# Patient Record
Sex: Male | Born: 1950 | Race: Black or African American | Hispanic: No | Smoking: Never smoker
Health system: Southern US, Community
[De-identification: ages and names within clinical notes are randomized; demographics above are authoritative.]

## PROBLEM LIST (undated history)

## (undated) DIAGNOSIS — R059 Cough, unspecified: Secondary | ICD-10-CM

## (undated) DIAGNOSIS — I739 Peripheral vascular disease, unspecified: Secondary | ICD-10-CM

## (undated) DIAGNOSIS — D649 Anemia, unspecified: Secondary | ICD-10-CM

## (undated) DIAGNOSIS — L723 Sebaceous cyst: Secondary | ICD-10-CM

## (undated) DIAGNOSIS — E43 Unspecified severe protein-calorie malnutrition: Secondary | ICD-10-CM

## (undated) DIAGNOSIS — N493 Fournier gangrene: Secondary | ICD-10-CM

## (undated) DIAGNOSIS — E039 Hypothyroidism, unspecified: Secondary | ICD-10-CM

## (undated) DIAGNOSIS — D631 Anemia in chronic kidney disease: Secondary | ICD-10-CM

## (undated) DIAGNOSIS — M1990 Unspecified osteoarthritis, unspecified site: Secondary | ICD-10-CM

## (undated) DIAGNOSIS — I4891 Unspecified atrial fibrillation: Secondary | ICD-10-CM

## (undated) DIAGNOSIS — R042 Hemoptysis: Secondary | ICD-10-CM

## (undated) DIAGNOSIS — M109 Gout, unspecified: Secondary | ICD-10-CM

## (undated) DIAGNOSIS — B1910 Unspecified viral hepatitis B without hepatic coma: Secondary | ICD-10-CM

## (undated) DIAGNOSIS — I499 Cardiac arrhythmia, unspecified: Secondary | ICD-10-CM

## (undated) DIAGNOSIS — I219 Acute myocardial infarction, unspecified: Secondary | ICD-10-CM

## (undated) DIAGNOSIS — J9600 Acute respiratory failure, unspecified whether with hypoxia or hypercapnia: Secondary | ICD-10-CM

## (undated) DIAGNOSIS — A419 Sepsis, unspecified organism: Secondary | ICD-10-CM

## (undated) DIAGNOSIS — E876 Hypokalemia: Secondary | ICD-10-CM

## (undated) DIAGNOSIS — K219 Gastro-esophageal reflux disease without esophagitis: Secondary | ICD-10-CM

## (undated) DIAGNOSIS — Z933 Colostomy status: Secondary | ICD-10-CM

## (undated) DIAGNOSIS — N186 End stage renal disease: Secondary | ICD-10-CM

## (undated) DIAGNOSIS — Z992 Dependence on renal dialysis: Secondary | ICD-10-CM

## (undated) DIAGNOSIS — B159 Hepatitis A without hepatic coma: Secondary | ICD-10-CM

## (undated) DIAGNOSIS — L97909 Non-pressure chronic ulcer of unspecified part of unspecified lower leg with unspecified severity: Secondary | ICD-10-CM

## (undated) DIAGNOSIS — B20 Human immunodeficiency virus [HIV] disease: Secondary | ICD-10-CM

## (undated) DIAGNOSIS — R4182 Altered mental status, unspecified: Secondary | ICD-10-CM

## (undated) DIAGNOSIS — B1920 Unspecified viral hepatitis C without hepatic coma: Secondary | ICD-10-CM

## (undated) DIAGNOSIS — F1010 Alcohol abuse, uncomplicated: Secondary | ICD-10-CM

## (undated) DIAGNOSIS — N039 Chronic nephritic syndrome with unspecified morphologic changes: Secondary | ICD-10-CM

## (undated) DIAGNOSIS — Z21 Asymptomatic human immunodeficiency virus [HIV] infection status: Secondary | ICD-10-CM

## (undated) DIAGNOSIS — R05 Cough: Secondary | ICD-10-CM

## (undated) DIAGNOSIS — K5900 Constipation, unspecified: Secondary | ICD-10-CM

## (undated) DIAGNOSIS — I10 Essential (primary) hypertension: Secondary | ICD-10-CM

## (undated) DIAGNOSIS — I96 Gangrene, not elsewhere classified: Secondary | ICD-10-CM

## (undated) HISTORY — DX: Acute respiratory failure, unspecified whether with hypoxia or hypercapnia: J96.00

## (undated) HISTORY — DX: Hemoptysis: R04.2

## (undated) HISTORY — PX: HERNIA REPAIR: SHX51

## (undated) HISTORY — DX: Hypokalemia: E87.6

## (undated) HISTORY — DX: Cough, unspecified: R05.9

## (undated) HISTORY — DX: Anemia in chronic kidney disease: D63.1

## (undated) HISTORY — DX: Unspecified severe protein-calorie malnutrition: E43

## (undated) HISTORY — DX: Altered mental status, unspecified: R41.82

## (undated) HISTORY — DX: Cough: R05

## (undated) HISTORY — DX: Disorder of phosphorus metabolism, unspecified: E83.30

## (undated) HISTORY — DX: Chronic nephritic syndrome with unspecified morphologic changes: N03.9

## (undated) HISTORY — DX: Peripheral vascular disease, unspecified: I73.9

## (undated) HISTORY — DX: Gastro-esophageal reflux disease without esophagitis: K21.9

## (undated) HISTORY — DX: Sebaceous cyst: L72.3

## (undated) HISTORY — DX: Colostomy status: Z93.3

## (undated) HISTORY — DX: Gangrene, not elsewhere classified: I96

## (undated) HISTORY — DX: Unspecified atrial fibrillation: I48.91

## (undated) HISTORY — DX: Non-pressure chronic ulcer of unspecified part of unspecified lower leg with unspecified severity: L97.909

## (undated) HISTORY — DX: Constipation, unspecified: K59.00

---

## 2006-06-23 ENCOUNTER — Emergency Department (HOSPITAL_COMMUNITY): Admission: EM | Admit: 2006-06-23 | Discharge: 2006-06-23 | Payer: Self-pay | Admitting: Emergency Medicine

## 2007-07-31 ENCOUNTER — Emergency Department (HOSPITAL_COMMUNITY): Admission: EM | Admit: 2007-07-31 | Discharge: 2007-07-31 | Payer: Self-pay | Admitting: Emergency Medicine

## 2010-07-13 ENCOUNTER — Encounter: Admission: RE | Admit: 2010-07-13 | Discharge: 2010-07-13 | Payer: Self-pay | Admitting: Internal Medicine

## 2010-09-15 ENCOUNTER — Encounter: Payer: Self-pay | Admitting: Internal Medicine

## 2010-09-15 DIAGNOSIS — B20 Human immunodeficiency virus [HIV] disease: Secondary | ICD-10-CM | POA: Insufficient documentation

## 2010-09-20 ENCOUNTER — Ambulatory Visit
Admission: RE | Admit: 2010-09-20 | Discharge: 2010-09-20 | Payer: Self-pay | Source: Home / Self Care | Attending: Internal Medicine | Admitting: Internal Medicine

## 2010-09-20 ENCOUNTER — Encounter: Payer: Self-pay | Admitting: Internal Medicine

## 2010-09-20 ENCOUNTER — Encounter: Payer: Self-pay | Admitting: Adult Health

## 2010-09-20 ENCOUNTER — Ambulatory Visit: Admit: 2010-09-20 | Payer: Self-pay | Admitting: Internal Medicine

## 2010-09-20 LAB — CONVERTED CEMR LAB
ALT: 38 units/L (ref 0–53)
AST: 44 units/L — ABNORMAL HIGH (ref 0–37)
Albumin: 4.2 g/dL (ref 3.5–5.2)
Alkaline Phosphatase: 58 units/L (ref 39–117)
BUN: 42 mg/dL — ABNORMAL HIGH (ref 6–23)
Bacteria, UA: NONE SEEN
Basophils Absolute: 0 10*3/uL (ref 0.0–0.1)
Basophils Relative: 1 % (ref 0–1)
Bilirubin Urine: NEGATIVE
CO2: 17 meq/L — ABNORMAL LOW (ref 19–32)
Calcium: 9.3 mg/dL (ref 8.4–10.5)
Casts: NONE SEEN /lpf
Chlamydia, Swab/Urine, PCR: NEGATIVE
Chloride: 108 meq/L (ref 96–112)
Cholesterol: 163 mg/dL (ref 0–200)
Creatinine, Ser: 2.47 mg/dL — ABNORMAL HIGH (ref 0.40–1.50)
Crystals: NONE SEEN
Eosinophils Absolute: 0.1 10*3/uL (ref 0.0–0.7)
Eosinophils Relative: 1 % (ref 0–5)
GC Probe Amp, Urine: NEGATIVE
Glucose, Bld: 94 mg/dL (ref 70–99)
HCT: 39.6 % (ref 39.0–52.0)
HCV Ab: REACTIVE — AB
HDL: 46 mg/dL (ref >39)
HIV-1 antibody: POSITIVE — AB
HIV-2 Ab: NEGATIVE
Hemoglobin: 12.9 g/dL — ABNORMAL LOW (ref 13.0–17.0)
Hep A Total Ab: POSITIVE — AB
Hep B Core Total Ab: POSITIVE — AB
Hep B S Ab: NEGATIVE
Hepatitis B Surface Ag: NEGATIVE
Ketones, ur: NEGATIVE mg/dL
LDL Cholesterol: 93 mg/dL (ref 0–99)
Leukocytes, UA: NEGATIVE
Lymphocytes Relative: 31 % (ref 12–46)
Lymphs Abs: 1.5 10*3/uL (ref 0.7–4.0)
MCHC: 32.6 g/dL (ref 30.0–36.0)
MCV: 93 fL (ref 78.0–100.0)
Monocytes Absolute: 0.6 10*3/uL (ref 0.1–1.0)
Monocytes Relative: 12 % (ref 3–12)
Neutro Abs: 2.6 10*3/uL (ref 1.7–7.7)
Neutrophils Relative %: 55 % (ref 43–77)
Nitrite: NEGATIVE
Platelets: 172 10*3/uL (ref 150–400)
Potassium: 4.3 meq/L (ref 3.5–5.3)
Protein, ur: 100 mg/dL — AB
RBC: 4.26 M/uL (ref 4.22–5.81)
RDW: 14.3 % (ref 11.5–15.5)
Sodium: 135 meq/L (ref 135–145)
Specific Gravity, Urine: 1.017 (ref 1.005–1.030)
Squamous Epithelial / LPF: NONE SEEN /lpf
Total Bilirubin: 0.6 mg/dL (ref 0.3–1.2)
Total CHOL/HDL Ratio: 3.5
Total Protein: 8.1 g/dL (ref 6.0–8.3)
Triglycerides: 121 mg/dL (ref <150)
Urine Glucose: NEGATIVE mg/dL
Urobilinogen, UA: 0.2 (ref 0.0–1.0)
VLDL: 24 mg/dL (ref 0–40)
WBC: 4.7 10*3/uL (ref 4.0–10.5)
pH: 5.5 (ref 5.0–8.0)

## 2010-09-26 LAB — T-HELPER CELL (CD4) - (RCID CLINIC ONLY)
CD4 % Helper T Cell: 23 % — ABNORMAL LOW (ref 33–55)
CD4 T Cell Abs: 340 uL — ABNORMAL LOW (ref 400–2700)

## 2010-10-02 ENCOUNTER — Encounter: Payer: Self-pay | Admitting: Internal Medicine

## 2010-10-05 ENCOUNTER — Ambulatory Visit: Admit: 2010-10-05 | Payer: Self-pay | Admitting: Internal Medicine

## 2010-10-07 ENCOUNTER — Encounter: Payer: Self-pay | Admitting: Adult Health

## 2010-10-07 ENCOUNTER — Ambulatory Visit
Admission: RE | Admit: 2010-10-07 | Discharge: 2010-10-07 | Payer: Self-pay | Source: Home / Self Care | Attending: Adult Health | Admitting: Adult Health

## 2010-10-07 DIAGNOSIS — B1710 Acute hepatitis C without hepatic coma: Secondary | ICD-10-CM | POA: Insufficient documentation

## 2010-10-07 DIAGNOSIS — R6882 Decreased libido: Secondary | ICD-10-CM | POA: Insufficient documentation

## 2010-10-07 DIAGNOSIS — N529 Male erectile dysfunction, unspecified: Secondary | ICD-10-CM | POA: Insufficient documentation

## 2010-10-07 DIAGNOSIS — M1A00X0 Idiopathic chronic gout, unspecified site, without tophus (tophi): Secondary | ICD-10-CM | POA: Insufficient documentation

## 2010-10-07 DIAGNOSIS — M1A9XX0 Chronic gout, unspecified, without tophus (tophi): Secondary | ICD-10-CM | POA: Insufficient documentation

## 2010-10-07 LAB — CONVERTED CEMR LAB
HCV Quantitative: 4770000 intl units/mL — ABNORMAL HIGH (ref <43)
HIV 1 RNA Quant: 28700 copies/mL — ABNORMAL HIGH (ref <20)
HIV-1 RNA Quant, Log: 4.46 — ABNORMAL HIGH (ref <1.30)
Testosterone: 804.96 ng/dL (ref 250–890)
Uric Acid, Serum: 9.8 mg/dL — ABNORMAL HIGH (ref 4.0–7.8)

## 2010-10-13 NOTE — Miscellaneous (Signed)
Summary: HIPAA Restrictions  HIPAA Restrictions   Imported By: Florinda Marker 09/20/2010 11:45:02  _____________________________________________________________________  External Attachment:    Type:   Image     Comment:   External Document

## 2010-10-13 NOTE — Miscellaneous (Signed)
Summary: Orders Update  Clinical Lists Changes  Problems: Added new problem of HIV INFECTION (ICD-042) Orders: Added new Test order of T-Comprehensive Metabolic Panel [phone]) - Signed Added new Test order of T-CBC w/Diff [phone]) - Signed Added new Test order of T-CD4SP [facility]) (CD4SP) - Signed Added new Test order of T-Chlamydia  Probe, urine [phone]) - Signed Added new Test order of T-GC Probe, urine [phone]) - Signed Added new Test order of T-Hepatitis B Surface Antigen [phone]) - Signed Added new Test order of T-Hepatitis B Surface Antibody [phone]) - Signed Added new Test order of T-Hepatitis B Core Antibody ([id number]) - Signed Added new Test order of T-Hepatitis C Antibody ([id number]) - Signed Added new Test order of T-HIV Ab Confirmatory Test/Western Blot ([id number]) - Signed Added new Test order of T-Lipid Profile ([id number]) - Signed Added new Test order of T-RPR (Syphilis) [phone]) - Signed Added new Test order of T-Urinalysis ([id number]) - Signed Added new Test order of T-Hepatitis A Antibody ([id number]) - Signed Added new Test order of T-HIV1 Quant rflx Ultra or Genotype ([id number]) - Signed

## 2010-10-21 ENCOUNTER — Ambulatory Visit: Payer: Self-pay | Admitting: Adult Health

## 2010-10-31 ENCOUNTER — Telehealth: Payer: Self-pay | Admitting: Adult Health

## 2010-11-04 ENCOUNTER — Encounter: Payer: Self-pay | Admitting: Adult Health

## 2010-11-04 ENCOUNTER — Other Ambulatory Visit: Payer: Self-pay

## 2010-11-04 ENCOUNTER — Encounter (INDEPENDENT_AMBULATORY_CARE_PROVIDER_SITE_OTHER): Payer: Self-pay | Admitting: *Deleted

## 2010-11-04 ENCOUNTER — Other Ambulatory Visit: Payer: Self-pay | Admitting: Adult Health

## 2010-11-04 LAB — CONVERTED CEMR LAB
ALT: 32 units/L (ref 0–53)
AST: 36 units/L (ref 0–37)
Albumin: 3.7 g/dL (ref 3.5–5.2)
Alkaline Phosphatase: 62 units/L (ref 39–117)
BUN: 35 mg/dL — ABNORMAL HIGH (ref 6–23)
Basophils Absolute: 0 10*3/uL (ref 0.0–0.1)
Basophils Relative: 0 % (ref 0–1)
CO2: 11 meq/L — ABNORMAL LOW (ref 19–32)
Calcium: 9 mg/dL (ref 8.4–10.5)
Chloride: 106 meq/L (ref 96–112)
Creatinine, Ser: 2.78 mg/dL — ABNORMAL HIGH (ref 0.40–1.50)
Eosinophils Absolute: 0.1 10*3/uL (ref 0.0–0.7)
Eosinophils Relative: 2 % (ref 0–5)
Glucose, Bld: 93 mg/dL (ref 70–99)
HCT: 39 % (ref 39.0–52.0)
HIV 1 RNA Quant: <20 copies/mL (ref <20)
HIV-1 RNA Quant, Log: <1.3 (ref <1.30)
Hemoglobin: 13.1 g/dL (ref 13.0–17.0)
Lymphocytes Relative: 42 % (ref 12–46)
Lymphs Abs: 2.1 10*3/uL (ref 0.7–4.0)
MCHC: 33.6 g/dL (ref 30.0–36.0)
MCV: 92.2 fL (ref 78.0–100.0)
Monocytes Absolute: 0.6 10*3/uL (ref 0.1–1.0)
Monocytes Relative: 11 % (ref 3–12)
Neutro Abs: 2.3 10*3/uL (ref 1.7–7.7)
Neutrophils Relative %: 46 % (ref 43–77)
Platelets: 185 10*3/uL (ref 150–400)
Potassium: 4.3 meq/L (ref 3.5–5.3)
RBC: 4.23 M/uL (ref 4.22–5.81)
RDW: 13.6 % (ref 11.5–15.5)
Sodium: 136 meq/L (ref 135–145)
Total Bilirubin: 0.5 mg/dL (ref 0.3–1.2)
Total Protein: 7.5 g/dL (ref 6.0–8.3)
WBC: 5.1 10*3/uL (ref 4.0–10.5)

## 2010-11-04 LAB — T-HELPER CELL (CD4) - (RCID CLINIC ONLY)
CD4 % Helper T Cell: 22 % — ABNORMAL LOW (ref 33–55)
CD4 T Cell Abs: 560 uL (ref 400–2700)

## 2010-11-07 ENCOUNTER — Encounter (INDEPENDENT_AMBULATORY_CARE_PROVIDER_SITE_OTHER): Payer: Self-pay | Admitting: *Deleted

## 2010-11-17 ENCOUNTER — Other Ambulatory Visit: Payer: Self-pay

## 2010-11-17 NOTE — Miscellaneous (Signed)
Summary: ADAP UPDATE   Clinical Lists Changes  Observations: Added new observation of RWTITLE: B (11/07/2010 13:02) Added new observation of PAYOR: No Insurance (11/07/2010 13:02) Added new observation of PCTFPL: 78.71  (11/07/2010 13:02) Added new observation of INCOMESOURCE: WAGES 2011 NOW UNEMPLOYED  (11/07/2010 13:02) Added new observation of AIDSDAP: Pending-APPROVAL 2012  (11/07/2010 13:02) Added new observation of HOUSEINCOME: 8524  (11/07/2010 13:02) Added new observation of #CHILD<18 IN: No  (11/07/2010 13:02) Added new observation of FAMILYSIZE: 1  (11/07/2010 13:02) Added new observation of HOUSING: Stable/permanent  (11/07/2010 13:02) Added new observation of YEARLYEXPEN: 0  (11/07/2010 13:02) Added new observation of FINASSESSDT: 11/04/2010  (11/07/2010 13:02) Added new observation of RWPARTICIP: Yes  (11/07/2010 13:02)

## 2010-11-21 ENCOUNTER — Encounter (INDEPENDENT_AMBULATORY_CARE_PROVIDER_SITE_OTHER): Payer: Self-pay | Admitting: *Deleted

## 2010-11-29 NOTE — Miscellaneous (Signed)
Summary: adap approved til 06/11/11  Clinical Lists Changes  Observations: Added new observation of AIDSDAP: Yes 2012 (11/21/2010 13:06)

## 2010-12-08 NOTE — Progress Notes (Signed)
Summary: needing OV notes faxed to Dr. Briant Cedar  Phone Note From Other Clinic   Caller: Nicholas Lose, [facility], [phone] x [phone], fax [phone] Summary of Call: Requesting office visit notes from last 2 visit.  Please fax ASAP to Dr. Briant Cedar at [phone].  Jennet Maduro RN  October 31, 2010 11:38 AM

## 2011-04-17 ENCOUNTER — Other Ambulatory Visit (INDEPENDENT_AMBULATORY_CARE_PROVIDER_SITE_OTHER): Payer: Self-pay

## 2011-04-17 ENCOUNTER — Other Ambulatory Visit: Payer: Self-pay | Admitting: Adult Health

## 2011-04-17 DIAGNOSIS — B20 Human immunodeficiency virus [HIV] disease: Secondary | ICD-10-CM

## 2011-04-18 LAB — CBC WITH DIFFERENTIAL/PLATELET
Basophils Absolute: 0 10*3/uL (ref 0.0–0.1)
Basophils Relative: 0 % (ref 0–1)
Eosinophils Absolute: 0 10*3/uL (ref 0.0–0.7)
Eosinophils Relative: 1 % (ref 0–5)
HCT: 35.5 % — ABNORMAL LOW (ref 39.0–52.0)
Hemoglobin: 11.4 g/dL — ABNORMAL LOW (ref 13.0–17.0)
Lymphocytes Relative: 27 % (ref 12–46)
Lymphs Abs: 1.8 10*3/uL (ref 0.7–4.0)
MCH: 30.1 pg (ref 26.0–34.0)
MCHC: 32.1 g/dL (ref 30.0–36.0)
MCV: 93.7 fL (ref 78.0–100.0)
Monocytes Absolute: 0.7 10*3/uL (ref 0.1–1.0)
Monocytes Relative: 10 % (ref 3–12)
Neutro Abs: 4.1 10*3/uL (ref 1.7–7.7)
Neutrophils Relative %: 61 % (ref 43–77)
Platelets: 171 10*3/uL (ref 150–400)
RBC: 3.79 MIL/uL — ABNORMAL LOW (ref 4.22–5.81)
RDW: 14.3 % (ref 11.5–15.5)
WBC: 6.6 10*3/uL (ref 4.0–10.5)

## 2011-04-18 LAB — COMPREHENSIVE METABOLIC PANEL
ALT: 66 U/L — ABNORMAL HIGH (ref 0–53)
AST: 63 U/L — ABNORMAL HIGH (ref 0–37)
Albumin: 3.5 g/dL (ref 3.5–5.2)
Alkaline Phosphatase: 57 U/L (ref 39–117)
BUN: 50 mg/dL — ABNORMAL HIGH (ref 6–23)
CO2: 17 mEq/L — ABNORMAL LOW (ref 19–32)
Calcium: 8.8 mg/dL (ref 8.4–10.5)
Chloride: 109 mEq/L (ref 96–112)
Creat: 4.51 mg/dL — ABNORMAL HIGH (ref 0.50–1.35)
Glucose, Bld: 89 mg/dL (ref 70–99)
Potassium: 4.3 mEq/L (ref 3.5–5.3)
Sodium: 137 mEq/L (ref 135–145)
Total Bilirubin: 0.7 mg/dL (ref 0.3–1.2)
Total Protein: 6.9 g/dL (ref 6.0–8.3)

## 2011-04-18 LAB — T-HELPER CELL (CD4) - (RCID CLINIC ONLY)
CD4 % Helper T Cell: 23 % — ABNORMAL LOW (ref 33–55)
CD4 T Cell Abs: 440 uL (ref 400–2700)

## 2011-04-19 LAB — HIV-1 RNA QUANT-NO REFLEX-BLD
HIV 1 RNA Quant: 16100 copies/mL — ABNORMAL HIGH (ref <20)
HIV-1 RNA Quant, Log: 4.21 {Log} — ABNORMAL HIGH (ref <1.30)

## 2011-04-21 ENCOUNTER — Other Ambulatory Visit: Payer: Self-pay | Admitting: Adult Health

## 2011-04-21 DIAGNOSIS — B20 Human immunodeficiency virus [HIV] disease: Secondary | ICD-10-CM

## 2011-04-24 LAB — HIV-1 RNA ULTRAQUANT REFLEX TO GENTYP+
HIV 1 RNA Quant: 17400 copies/mL — ABNORMAL HIGH (ref <20)
HIV-1 RNA Quant, Log: 4.24 {Log} — ABNORMAL HIGH (ref <1.30)

## 2011-05-01 LAB — HIV-1 GENOTYPR PLUS

## 2011-05-02 ENCOUNTER — Ambulatory Visit (INDEPENDENT_AMBULATORY_CARE_PROVIDER_SITE_OTHER): Payer: Self-pay | Admitting: Adult Health

## 2011-05-02 ENCOUNTER — Encounter: Payer: Self-pay | Admitting: Adult Health

## 2011-05-02 ENCOUNTER — Ambulatory Visit: Payer: Self-pay

## 2011-05-02 VITALS — BP 168/93 | HR 89 | Temp 98.4°F | Ht 71.0 in | Wt 242.0 lb

## 2011-05-02 DIAGNOSIS — B20 Human immunodeficiency virus [HIV] disease: Secondary | ICD-10-CM

## 2011-05-03 ENCOUNTER — Ambulatory Visit: Payer: Self-pay

## 2011-05-16 ENCOUNTER — Ambulatory Visit: Payer: Self-pay | Admitting: Adult Health

## 2011-06-20 LAB — URIC ACID: Uric Acid, Serum: 9.7 — ABNORMAL HIGH

## 2011-09-12 HISTORY — PX: COLOSTOMY: SHX63

## 2011-10-17 NOTE — Assessment & Plan Note (Signed)
Was off his medications for approximately 6 weeks prior to labs as a result of his ADAP lapsing. Encouraged to followup with Mrs. Jeraldine Loots to reestablish eligibility for ADAP and resume medications. Counseling provided on prevention of transmission of HIV. Condoms offered:  Yes Medication adherence discussed with patient. Eligibility coordinator Follow up visit in 4 months with labs one month from today and 2 weeks prior to appointment. Patient acknowledged information provided to them and agreed with plan of care.

## 2011-10-17 NOTE — Progress Notes (Signed)
Subjective:    Patient ID: Bryan Gilmore is a 61 y.o. male.  Chief Complaint: HIV Follow-up Visit PAL SHELL is here for follow-up of HIV infection. He is feeling unchanged since his last visit.  He relates that he has been off his antiretrovirals for over 6 weeks as a result of his ADAP lapsing. He states he plans to have this taken care of with the eligibility coordinator today. There are not additional complaints.   Data Review: Diagnostic studies reviewed.  Review of Systems - General ROS: negative for - fatigue or malaise Psychological ROS: negative for - anxiety, behavioral disorder, concentration difficulties, depression or mood swings Ophthalmic ROS: negative ENT ROS: negative Respiratory ROS: no cough, shortness of breath, or wheezing Cardiovascular ROS: no chest pain or dyspnea on exertion Gastrointestinal ROS: no abdominal pain, change in bowel habits, or black or bloody stools Genito-Urinary ROS: no dysuria, trouble voiding, or hematuria Neurological ROS: no TIA or stroke symptoms Dermatological ROS: negative for rash and skin lesion changes  Objective:   General appearance: alert and cooperative Head: Normocephalic, without obvious abnormality, atraumatic Eyes: conjunctivae/corneas clear. PERRL, EOM's intact. Fundi benign. Throat: lips, mucosa, and tongue normal; teeth and gums normal Resp: clear to auscultation bilaterally Cardio: regular rate and rhythm, S1, S2 normal, no murmur, click, rub or gallop GI: soft, non-tender; bowel sounds normal; no masses,  no organomegaly Extremities: extremities normal, atraumatic, no cyanosis or edema Skin: Skin color, texture, turgor normal. No rashes or lesions Neurologic: Grossly normal Psych:  No vegetative signs or delusional behaviors noted.    Laboratory: From 04/17/2011 ,  CD4 count was 440 c/cmm @ 23 %. Viral load 17,400 copies/ml. HIV genotype, consistent with wild-type virus as pansensitive.   Assessment/Plan:    HIV INFECTION Was off his medications for approximately 6 weeks prior to labs as a result of his ADAP lapsing. Encouraged to followup with Mrs. Jeraldine Loots to reestablish eligibility for ADAP and resume medications. Counseling provided on prevention of transmission of HIV. Condoms offered:  Yes Medication adherence discussed with patient. Eligibility coordinator Follow up visit in 4 months with labs one month from today and 2 weeks prior to appointment. Patient acknowledged information provided to them and agreed with plan of care.        Eppie Barhorst A. Sundra Aland, MS, [facility] [phone]  10/17/2011, 8:25 AM

## 2011-12-14 ENCOUNTER — Ambulatory Visit: Payer: Self-pay

## 2011-12-18 ENCOUNTER — Ambulatory Visit: Payer: Self-pay

## 2011-12-26 ENCOUNTER — Other Ambulatory Visit: Payer: Self-pay

## 2011-12-26 DIAGNOSIS — Z01810 Encounter for preprocedural cardiovascular examination: Secondary | ICD-10-CM

## 2011-12-26 DIAGNOSIS — N184 Chronic kidney disease, stage 4 (severe): Secondary | ICD-10-CM

## 2012-02-01 ENCOUNTER — Encounter: Payer: Self-pay | Admitting: Vascular Surgery

## 2012-02-02 ENCOUNTER — Ambulatory Visit: Payer: Self-pay | Admitting: Vascular Surgery

## 2012-02-08 ENCOUNTER — Telehealth: Payer: Self-pay | Admitting: *Deleted

## 2012-02-08 DIAGNOSIS — Z113 Encounter for screening for infections with a predominantly sexual mode of transmission: Secondary | ICD-10-CM

## 2012-02-08 DIAGNOSIS — B20 Human immunodeficiency virus [HIV] disease: Secondary | ICD-10-CM

## 2012-02-08 DIAGNOSIS — Z79899 Other long term (current) drug therapy: Secondary | ICD-10-CM

## 2012-02-08 NOTE — Telephone Encounter (Signed)
I called & asked him to make appts for labs & to see md. I transferred him to [facility] who will schedule him. Lab orders entered

## 2012-02-10 HISTORY — PX: COLOSTOMY: SHX63

## 2012-02-10 HISTORY — PX: OTHER SURGICAL HISTORY: SHX169

## 2012-02-13 ENCOUNTER — Encounter (HOSPITAL_COMMUNITY): Payer: Self-pay | Admitting: Emergency Medicine

## 2012-02-13 ENCOUNTER — Emergency Department (HOSPITAL_COMMUNITY)
Admission: EM | Admit: 2012-02-13 | Discharge: 2012-02-13 | Disposition: A | Discharge status: Home / Self Care | Payer: Self-pay | Attending: Emergency Medicine | Admitting: Emergency Medicine

## 2012-02-13 DIAGNOSIS — N472 Paraphimosis: Secondary | ICD-10-CM

## 2012-02-13 DIAGNOSIS — N289 Disorder of kidney and ureter, unspecified: Secondary | ICD-10-CM | POA: Insufficient documentation

## 2012-02-13 DIAGNOSIS — N4889 Other specified disorders of penis: Secondary | ICD-10-CM | POA: Insufficient documentation

## 2012-02-13 DIAGNOSIS — I10 Essential (primary) hypertension: Secondary | ICD-10-CM | POA: Insufficient documentation

## 2012-02-13 DIAGNOSIS — M533 Sacrococcygeal disorders, not elsewhere classified: Secondary | ICD-10-CM | POA: Insufficient documentation

## 2012-02-13 DIAGNOSIS — M5430 Sciatica, unspecified side: Secondary | ICD-10-CM

## 2012-02-13 DIAGNOSIS — N390 Urinary tract infection, site not specified: Secondary | ICD-10-CM

## 2012-02-13 DIAGNOSIS — Z21 Asymptomatic human immunodeficiency virus [HIV] infection status: Secondary | ICD-10-CM | POA: Insufficient documentation

## 2012-02-13 DIAGNOSIS — A6000 Herpesviral infection of urogenital system, unspecified: Secondary | ICD-10-CM

## 2012-02-13 HISTORY — DX: Essential (primary) hypertension: I10

## 2012-02-13 HISTORY — DX: Asymptomatic human immunodeficiency virus (hiv) infection status: Z21

## 2012-02-13 HISTORY — DX: Human immunodeficiency virus (HIV) disease: B20

## 2012-02-13 LAB — URINALYSIS, ROUTINE W REFLEX MICROSCOPIC
Nitrite: NEGATIVE
Protein, ur: >300 mg/dL — AB
Specific Gravity, Urine: 1.017 (ref 1.005–1.030)
Urobilinogen, UA: 0.2 mg/dL (ref 0.0–1.0)

## 2012-02-13 LAB — URINE MICROSCOPIC-ADD ON

## 2012-02-13 MED ORDER — ACYCLOVIR 200 MG PO CAPS
400.0000 mg | ORAL_CAPSULE | Freq: Once | ORAL | Status: AC
  Administered 2012-02-13: 400 mg via ORAL
  Filled 2012-02-13: qty 2

## 2012-02-13 MED ORDER — IBUPROFEN 800 MG PO TABS
800.0000 mg | ORAL_TABLET | Freq: Once | ORAL | Status: AC
  Administered 2012-02-13: 800 mg via ORAL
  Filled 2012-02-13: qty 1

## 2012-02-13 MED ORDER — SULFAMETHOXAZOLE-TMP DS 800-160 MG PO TABS: 1.0000 | ORAL_TABLET | Freq: Two times a day (BID) | ORAL | Status: AC

## 2012-02-13 MED ORDER — SULFAMETHOXAZOLE-TMP DS 800-160 MG PO TABS
1.0000 | ORAL_TABLET | Freq: Once | ORAL | Status: AC
  Administered 2012-02-13: 1 via ORAL
  Filled 2012-02-13: qty 1

## 2012-02-13 MED ORDER — ACYCLOVIR 200 MG PO CAPS: 400.0000 mg | ORAL_CAPSULE | Freq: Three times a day (TID) | ORAL | Status: AC

## 2012-02-13 NOTE — ED Notes (Signed)
Patient states having groin pain and buttocks pain for one week. Stated dysuria and after triage gave a urine specimen and urinated blood.  States have not been sexually active.  Pain 7/10 achy sharp pain.

## 2012-02-13 NOTE — ED Notes (Signed)
Onset one week ago groin and buttock pain seen at [facility] department test negative.  Pain currently 6-7/10 achy dull states pain keeps patient up at night.  States dysuria.

## 2012-02-13 NOTE — ED Provider Notes (Signed)
History  Scribed for [employer]. Bernette Mayers, MD, the patient was seen in room STRE8/STRE8. This chart was scribed by Candelaria Stagers. The patient's care started at 11:11 AM    CSN: [id number]  Arrival date & time 02/13/12  1191   First MD Initiated Contact with Patient 02/13/12 1106      Chief Complaint  Patient presents with  . Groin Pain    Groin Pain This is a new problem. The current episode started in the past 7 days. The problem occurs constantly. The problem has been unchanged. The pain is medium. Associated symptoms include dysuria. Pertinent negatives include no abdominal pain, constipation, fever, nausea or vomiting. He has tried nothing for the symptoms. The treatment provided no relief. His past medical history is significant for HIV.   Bryan Gilmore is a 61 y.o. male who presents to the Emergency Department complaining of groin pain that started about one week ago.  Pt states that there is swelling to his penis and pain to his buttocks.  He is experiencing dysuria and numbness down his leg.  He denies back pain, bowel trouble, nausea, vomiting.  Pt is seen in the infectious disease clinic and has been taking his medications regularly.  Pt states that sitting on his buttocks makes the pain worse.  He has taken nothing for the pain.   Past Medical History  Diagnosis Date  . Hypertension   . Renal disorder   . HIV (human immunodeficiency virus infection)     History reviewed. No pertinent past surgical history.  No family history on file.  History  Substance Use Topics  . Smoking status: Never Smoker   . Smokeless tobacco: Never Used  . Alcohol Use: No      Review of Systems  Constitutional: Negative for fever.  Gastrointestinal: Negative for nausea, vomiting, abdominal pain and constipation.  Genitourinary: Positive for dysuria and penile swelling.  Musculoskeletal: Negative for back pain.  Neurological: Positive for numbness (down right leg).    Allergies    Review of patient's allergies indicates no known allergies.  Home Medications   Current Outpatient Rx  Name Route Sig Dispense Refill  . ALLOPURINOL 100 MG PO TABS Oral Take 300 mg by mouth daily.    Marland Kitchen EMTRICITABINE-TENOFOVIR 200-300 MG PO TABS Oral Take 1 tablet by mouth every Monday, Wednesday, and Friday.      . IBUPROFEN 800 MG PO TABS Oral Take 800 mg by mouth every 8 (eight) hours as needed. For gout flare up    . NEBIVOLOL HCL 20 MG PO TABS Oral Take 20 mg by mouth daily.    Marland Kitchen PARICALCITOL 1 MCG PO CAPS Oral Take 1 mcg by mouth daily.      Marland Kitchen RALTEGRAVIR POTASSIUM 400 MG PO TABS Oral Take 400 mg by mouth 2 (two) times daily.        BP 150/95  Pulse 75  Temp(Src) 97.7 F (36.5 C) (Oral)  Resp 16  SpO2 99%  Physical Exam  Nursing note and vitals reviewed. Constitutional: He is oriented to person, place, and time. He appears well-developed and well-nourished. No distress.  HENT:  Head: Normocephalic and atraumatic.  Eyes: EOM are normal. Pupils are equal, round, and reactive to light.  Neck: Neck supple. No tracheal deviation present.  Cardiovascular: Normal rate.   Pulmonary/Chest: Effort normal. No respiratory distress.  Abdominal: Soft. He exhibits no distension.  Genitourinary:       Uncircumcised.  Glans Normal.  Swelling around the foreskin.  2cm ulceration on the foreskin.  Moderate inguinal lymphadenopathy. No evidence of perineal swelling, erythema or induration. No concern for Fornier's Gangrene  Musculoskeletal: Normal range of motion. He exhibits tenderness (tender over the R sciatic notch). He exhibits no edema.  Neurological: He is alert and oriented to person, place, and time. No sensory deficit.  Skin: Skin is warm and dry.  Psychiatric: He has a normal mood and affect. His behavior is normal.    ED Course  Procedures  DIAGNOSTIC STUDIES: Oxygen Saturation is 99% on room air, normal by my interpretation.    COORDINATION OF CARE:     Labs Reviewed   URINALYSIS, ROUTINE W REFLEX MICROSCOPIC - Abnormal; Notable for the following:    APPearance CLOUDY (*)    Hgb urine dipstick LARGE (*)    Protein, ur >300 (*)    Leukocytes, UA LARGE (*)    All other components within normal limits  URINE MICROSCOPIC-ADD ON - Abnormal; Notable for the following:    Squamous Epithelial / LPF FEW (*)    Bacteria, UA FEW (*)    Casts HYALINE CASTS (*)    All other components within normal limits   No results found.   No diagnosis found.    MDM  UA shows evidence of infection, however not clear if this is a good clean sample given his mild paraphimosis. Will treat with Bactrim, foreskin is easily retracted. Painful ulcer could be herpetic, will also treat with Acyclovir. Buttock pain is likely sciatica, doubt deep infection. Pt states ibuprofen typically manages his pain at home. Advised followup with ID as scheduled  I personally performed the services described in the documentation, which were scribed in my presence. The recorded information has been reviewed and considered.             Tadeusz Stahl B. Bernette Mayers, MD 02/13/12 1149

## 2012-02-13 NOTE — Discharge Instructions (Signed)
Genital Herpes Genital herpes is a sexually transmitted disease. This means that it is a disease passed by having sex with an infected person. There is no cure for genital herpes. The time between attacks can be months to years. The virus may live in a person but produce no problems (symptoms). This infection can be passed to a baby as it travels down the birth canal (vagina). In a newborn, this can cause central nervous system damage, eye damage, or even death. The virus that causes genital herpes is usually HSV-2 virus. The virus that causes oral herpes is usually HSV-1. The diagnosis (learning what is wrong) is made through culture results. SYMPTOMS  Usually symptoms of pain and itching begin a few days to a week after contact. It first appears as small blisters that progress to small painful ulcers which then scab over and heal after several days. It affects the outer genitalia, birth canal, cervix, penis, anal area, buttocks, and thighs. HOME CARE INSTRUCTIONS   Keep ulcerated areas dry and clean.   Take medications as directed. Antiviral medications can speed up healing. They will not prevent recurrences or cure this infection. These medications can also be taken for suppression if there are frequent recurrences.   While the infection is active, it is contagious. Avoid all sexual contact during active infections.   Condoms may help prevent spread of the herpes virus.   Practice safe sex.   Wash your hands thoroughly after touching the genital area.   Avoid touching your eyes after touching your genital area.   Inform your caregiver if you have had genital herpes and become pregnant. It is your responsibility to insure a safe outcome for your baby in this pregnancy.   Only take over-the-counter or prescription medicines for pain, discomfort, or fever as directed by your caregiver.  SEEK MEDICAL CARE IF:   You have a recurrence of this infection.   You do not respond to medications and  are not improving.   You have new sources of pain or discharge which have changed from the original infection.   You have an oral temperature above 102 F (38.9 C).   You develop abdominal pain.   You develop eye pain or signs of eye infection.  Document Released: 08/25/2000 Document Revised: 08/17/2011 Document Reviewed: 09/15/2009 [employer] [facility] Patient Information 2012 [place], [place].Foreskin Problems This is information about problems that may occur if you were not circumcised. Keeping the penis and foreskin clean is very important. The types of problems that may require medical attention include:  Balanitis - This is an infection of the head of the penis from yeast, viruses, or bacteria. A red rash, swelling, and pain may develop. The treatment may include:   Sitz baths.   Medicine you put on the skin (topical).   Medicine you take by mouth (oral).   Phimosis - This occurs when the foreskin cannot be pulled back off the head of the penis. This can develop with a balanitis infection.   Paraphimosis - This happens when a tight foreskin is pulled back off the head of the penis and becomes stuck. This can cause pain, swelling, and reduce the blood flow and injure the head of the penis.  Phimosis and paraphimosis can be prevented by good hygiene. Retract the foreskin and wash around the head of the penis whenever you take a shower or bath. You should also replace the foreskin after it is retracted. Paraphimosis is a true emergency and requires immediate medical attention to get  the foreskin back over the end of the penis, and circumcision may be needed to prevent future problems.  SEEK IMMEDIATE MEDICAL CARE IF:   You are unable to urinate.   Have severe swelling or pain.  Document Released: 10/05/2004 Document Revised: 08/17/2011 Document Reviewed: 11/02/2008 [facility] Patient Information 2012 [place], [place].Sciatica Sciatica is a weakness and/or changes in sensation (tingling, jolts,  hot and cold, numbness) along the path the sciatic nerve travels. Irritation or damage to lumbar nerve roots is often also referred to as lumbar radiculopathy.  Lumbar radiculopathy (Sciatica) is the most common form of this problem. Radiculopathy can occur in any of the nerves coming out of the spinal cord. The problems caused depend on which nerves are involved. The sciatic nerve is the large nerve supplying the branches of nerves going from the hip to the toes. It often causes a numbness or weakness in the skin and/or muscles that the sciatic nerve serves. It also may cause symptoms (problems) of pain, burning, tingling, or electric shock-like feelings in the path of this nerve. This usually comes from injury to the fibers that make up the sciatic nerve. Some of these symptoms are low back pain and/or unpleasant feelings in the following areas:  From the mid-buttock down the back of the leg to the back of the knee.   And/or the outside of the calf and top of the foot.   And/or behind the inner ankle to the sole of the foot.  CAUSES   Herniated or slipped disc. Discs are the little cushions between the bones in the back.   Pressure by the piriformis muscle in the buttock on the sciatic nerve (Piriformis Syndrome).   Misalignment of the bones in the lower back and buttocks (Sacroiliac Joint Derangement).   Narrowing of the spinal canal that puts pressure on or pinches the fibers that make up the sciatic nerve.   A slipped vertebra that is out of line with those above or beneath it.   Abnormality of the nervous system itself so that nerve fibers do not transmit signals properly, especially to feet and calves (neuropathy).   Tumor (this is rare).  Your caregiver can usually determine the cause of your sciatica and begin the treatment most likely to help you. TREATMENT  Taking over-the-counter painkillers, physical therapy, rest, exercise, spinal manipulation, and injections of anesthetics  and/or steroids may be used. Surgery, acupuncture, and Yoga can also be effective. Mind over matter techniques, mental imagery, and changing factors such as your bed, chair, desk height, posture, and activities are other treatments that may be helpful. You and your caregiver can help determine what is best for you. With proper diagnosis, the cause of most sciatica can be identified and removed. Communication and cooperation between your caregiver and you is essential. If you are not successful immediately, do not be discouraged. With time, a proper treatment can be found that will make you comfortable. HOME CARE INSTRUCTIONS   If the pain is coming from a problem in the back, applying ice to that area for 15 to 20 minutes, 3 to 4 times per day while awake, may be helpful. Put the ice in a plastic bag. Place a towel between the bag of ice and your skin.   You may exercise or perform your usual activities if these do not aggravate your pain, or as suggested by your caregiver.   Only take over-the-counter or prescription medicines for pain, discomfort, or fever as directed by your caregiver.  If your caregiver has given you a follow-up appointment, it is very important to keep that appointment. Not keeping the appointment could result in a chronic or permanent injury, pain, and disability. If there is any problem keeping the appointment, you must call back to this facility for assistance.  SEEK IMMEDIATE MEDICAL CARE IF:   You experience loss of control of bowel or bladder.   You have increasing weakness in the trunk, buttocks, or legs.   There is numbness in any areas from the hip down to the toes.   You have difficulty walking or keeping your balance.   You have any of the above, with fever or forceful vomiting.  Document Released: 08/22/2001 Document Revised: 08/17/2011 Document Reviewed: 04/10/2008 [facility] Patient Information 2012 [place], [place].

## 2012-02-13 NOTE — ED Notes (Signed)
Patient uncircumcised and states noticed 3 days ago tip of penis light pink. Currently skin light pink no drainage present 0.25cm irregular shaped.

## 2012-02-13 NOTE — ED Notes (Signed)
Pt d/c home in NAD. Pt voiced understanding of d/c instructions including the importance of taking medication and of follow up.

## 2012-02-17 ENCOUNTER — Encounter (HOSPITAL_COMMUNITY): Payer: Self-pay | Admitting: Certified Registered Nurse Anesthetist

## 2012-02-17 ENCOUNTER — Inpatient Hospital Stay (HOSPITAL_COMMUNITY)
Admission: EM | Admit: 2012-02-17 | Discharge: 2012-03-01 | DRG: 569 | Payer: BC Managed Care – PPO | Attending: Internal Medicine | Admitting: Internal Medicine

## 2012-02-17 ENCOUNTER — Inpatient Hospital Stay (HOSPITAL_COMMUNITY): Payer: BC Managed Care – PPO

## 2012-02-17 ENCOUNTER — Encounter (HOSPITAL_COMMUNITY): Payer: Self-pay | Admitting: Emergency Medicine

## 2012-02-17 ENCOUNTER — Emergency Department (HOSPITAL_COMMUNITY): Payer: BC Managed Care – PPO | Admitting: Certified Registered Nurse Anesthetist

## 2012-02-17 ENCOUNTER — Encounter (HOSPITAL_COMMUNITY)
Admission: EM | Disposition: A | Discharge status: Other Acute Inpatient Hospital | Payer: Self-pay | Source: Home / Self Care | Attending: Pulmonary Disease

## 2012-02-17 ENCOUNTER — Emergency Department (HOSPITAL_COMMUNITY): Payer: BC Managed Care – PPO

## 2012-02-17 DIAGNOSIS — D631 Anemia in chronic kidney disease: Secondary | ICD-10-CM | POA: Diagnosis present

## 2012-02-17 DIAGNOSIS — E875 Hyperkalemia: Secondary | ICD-10-CM | POA: Diagnosis present

## 2012-02-17 DIAGNOSIS — R042 Hemoptysis: Secondary | ICD-10-CM | POA: Diagnosis not present

## 2012-02-17 DIAGNOSIS — I96 Gangrene, not elsewhere classified: Secondary | ICD-10-CM

## 2012-02-17 DIAGNOSIS — N179 Acute kidney failure, unspecified: Secondary | ICD-10-CM | POA: Diagnosis present

## 2012-02-17 DIAGNOSIS — D649 Anemia, unspecified: Secondary | ICD-10-CM | POA: Diagnosis present

## 2012-02-17 DIAGNOSIS — J9600 Acute respiratory failure, unspecified whether with hypoxia or hypercapnia: Secondary | ICD-10-CM

## 2012-02-17 DIAGNOSIS — N189 Chronic kidney disease, unspecified: Secondary | ICD-10-CM | POA: Diagnosis present

## 2012-02-17 DIAGNOSIS — E876 Hypokalemia: Secondary | ICD-10-CM | POA: Diagnosis not present

## 2012-02-17 DIAGNOSIS — E871 Hypo-osmolality and hyponatremia: Secondary | ICD-10-CM | POA: Diagnosis present

## 2012-02-17 DIAGNOSIS — N19 Unspecified kidney failure: Secondary | ICD-10-CM

## 2012-02-17 DIAGNOSIS — B1920 Unspecified viral hepatitis C without hepatic coma: Secondary | ICD-10-CM | POA: Diagnosis present

## 2012-02-17 DIAGNOSIS — E872 Acidosis, unspecified: Secondary | ICD-10-CM | POA: Diagnosis present

## 2012-02-17 DIAGNOSIS — A419 Sepsis, unspecified organism: Secondary | ICD-10-CM | POA: Diagnosis present

## 2012-02-17 DIAGNOSIS — B1710 Acute hepatitis C without hepatic coma: Secondary | ICD-10-CM

## 2012-02-17 DIAGNOSIS — N529 Male erectile dysfunction, unspecified: Secondary | ICD-10-CM

## 2012-02-17 DIAGNOSIS — N493 Fournier gangrene: Secondary | ICD-10-CM

## 2012-02-17 DIAGNOSIS — E43 Unspecified severe protein-calorie malnutrition: Secondary | ICD-10-CM | POA: Diagnosis present

## 2012-02-17 DIAGNOSIS — B20 Human immunodeficiency virus [HIV] disease: Secondary | ICD-10-CM

## 2012-02-17 DIAGNOSIS — R339 Retention of urine, unspecified: Secondary | ICD-10-CM | POA: Diagnosis present

## 2012-02-17 DIAGNOSIS — K612 Anorectal abscess: Secondary | ICD-10-CM | POA: Diagnosis present

## 2012-02-17 DIAGNOSIS — N35919 Unspecified urethral stricture, male, unspecified site: Principal | ICD-10-CM | POA: Diagnosis present

## 2012-02-17 DIAGNOSIS — A413 Sepsis due to Hemophilus influenzae: Secondary | ICD-10-CM

## 2012-02-17 DIAGNOSIS — N186 End stage renal disease: Secondary | ICD-10-CM | POA: Diagnosis present

## 2012-02-17 DIAGNOSIS — N501 Vascular disorders of male genital organs: Secondary | ICD-10-CM

## 2012-02-17 DIAGNOSIS — R6882 Decreased libido: Secondary | ICD-10-CM

## 2012-02-17 DIAGNOSIS — M1A00X0 Idiopathic chronic gout, unspecified site, without tophus (tophi): Secondary | ICD-10-CM

## 2012-02-17 HISTORY — PX: CYSTOSCOPY WITH URETHRAL DILATATION: SHX5125

## 2012-02-17 LAB — DIFFERENTIAL
Basophils Absolute: 0 10*3/uL (ref 0.0–0.1)
Basophils Absolute: 0 10*3/uL (ref 0.0–0.1)
Basophils Relative: 0 % (ref 0–1)
Eosinophils Absolute: 0 10*3/uL (ref 0.0–0.7)
Lymphocytes Relative: 4 % — ABNORMAL LOW (ref 12–46)
Lymphs Abs: 0.6 10*3/uL — ABNORMAL LOW (ref 0.7–4.0)
Monocytes Absolute: 1.7 10*3/uL — ABNORMAL HIGH (ref 0.1–1.0)
Monocytes Relative: 4 % (ref 3–12)
Neutrophils Relative %: 92 % — ABNORMAL HIGH (ref 43–77)
Neutrophils Relative %: 92 % — ABNORMAL HIGH (ref 43–77)

## 2012-02-17 LAB — HEPATIC FUNCTION PANEL
ALT: 12 U/L (ref 0–53)
AST: 22 U/L (ref 0–37)
Alkaline Phosphatase: 105 U/L (ref 39–117)
Bilirubin, Direct: 0.5 mg/dL — ABNORMAL HIGH (ref 0.0–0.3)
Total Bilirubin: 0.6 mg/dL (ref 0.3–1.2)

## 2012-02-17 LAB — COMPREHENSIVE METABOLIC PANEL
ALT: 10 U/L (ref 0–53)
AST: 23 U/L (ref 0–37)
CO2: 12 mEq/L — ABNORMAL LOW (ref 19–32)
Calcium: 8 mg/dL — ABNORMAL LOW (ref 8.4–10.5)
Sodium: 132 mEq/L — ABNORMAL LOW (ref 135–145)
Total Protein: 6.3 g/dL (ref 6.0–8.3)

## 2012-02-17 LAB — URINALYSIS, ROUTINE W REFLEX MICROSCOPIC
Bilirubin Urine: NEGATIVE
Glucose, UA: NEGATIVE mg/dL
Ketones, ur: NEGATIVE mg/dL
Nitrite: NEGATIVE
Specific Gravity, Urine: 1.024 (ref 1.005–1.030)
pH: 6 (ref 5.0–8.0)

## 2012-02-17 LAB — BASIC METABOLIC PANEL
CO2: 7 mEq/L — CL (ref 19–32)
CO2: 9 mEq/L — CL (ref 19–32)
Calcium: 9 mg/dL (ref 8.4–10.5)
Calcium: 8.5 mg/dL (ref 8.4–10.5)
Creatinine, Ser: 8.63 mg/dL — ABNORMAL HIGH (ref 0.50–1.35)
GFR calc non Af Amer: 6 mL/min — ABNORMAL LOW (ref >90)
Glucose, Bld: 93 mg/dL (ref 70–99)
Glucose, Bld: 111 mg/dL — ABNORMAL HIGH (ref 70–99)
Sodium: 128 mEq/L — ABNORMAL LOW (ref 135–145)

## 2012-02-17 LAB — CBC
Hemoglobin: 8.7 g/dL — ABNORMAL LOW (ref 13.0–17.0)
Hemoglobin: 6.2 g/dL — CL (ref 13.0–17.0)
MCH: 29.7 pg (ref 26.0–34.0)
MCH: 29.6 pg (ref 26.0–34.0)
MCHC: 35.8 g/dL (ref 30.0–36.0)
MCV: 85.9 fL (ref 78.0–100.0)
MCV: 85.7 fL (ref 78.0–100.0)
Platelets: 212 10*3/uL (ref 150–400)
Platelets: 212 10*3/uL (ref 150–400)
RBC: 2.94 MIL/uL — ABNORMAL LOW (ref 4.22–5.81)
RBC: 2.08 MIL/uL — ABNORMAL LOW (ref 4.22–5.81)
RDW: 14 % (ref 11.5–15.5)
WBC: 27.8 10*3/uL — ABNORMAL HIGH (ref 4.0–10.5)
WBC: 28 10*3/uL — ABNORMAL HIGH (ref 4.0–10.5)

## 2012-02-17 LAB — BLOOD GAS, ARTERIAL
Bicarbonate: 7.9 mEq/L — ABNORMAL LOW (ref 20.0–24.0)
O2 Content: 10 L/min
O2 Saturation: 98.4 %
Patient temperature: 98.6
pH, Arterial: 7.129 — CL (ref 7.350–7.450)

## 2012-02-17 LAB — URINE MICROSCOPIC-ADD ON

## 2012-02-17 LAB — POCT I-STAT 3, ART BLOOD GAS (G3+)
Bicarbonate: 12.7 mEq/L — ABNORMAL LOW (ref 20.0–24.0)
pCO2 arterial: 26.1 mmHg — ABNORMAL LOW (ref 35.0–45.0)
pH, Arterial: 7.298 — ABNORMAL LOW (ref 7.350–7.450)
pO2, Arterial: 216 mmHg — ABNORMAL HIGH (ref 80.0–100.0)

## 2012-02-17 LAB — LACTIC ACID, PLASMA: Lactic Acid, Venous: 1.1 mmol/L (ref 0.5–2.2)

## 2012-02-17 LAB — PREPARE RBC (CROSSMATCH)

## 2012-02-17 LAB — PROTIME-INR: INR: 1.53 — ABNORMAL HIGH (ref 0.00–1.49)

## 2012-02-17 LAB — PROCALCITONIN: Procalcitonin: 13.97 ng/mL

## 2012-02-17 LAB — PHOSPHORUS: Phosphorus: 9.5 mg/dL — ABNORMAL HIGH (ref 2.3–4.6)

## 2012-02-17 SURGERY — CYSTOSCOPY, WITH URETHRAL DILATION
Anesthesia: General | Site: Urethra | Wound class: Clean Contaminated

## 2012-02-17 MED ORDER — EMTRICITABINE-TENOFOVIR DF 200-300 MG PO TABS: 1.0000 | ORAL_TABLET | ORAL | Status: DC

## 2012-02-17 MED ORDER — ACYCLOVIR 400 MG PO TABS
400.0000 mg | ORAL_TABLET | Freq: Three times a day (TID) | ORAL | Status: DC
  Filled 2012-02-17 (×3): qty 1

## 2012-02-17 MED ORDER — SODIUM CHLORIDE 0.9 % FOR CRRT
INTRAVENOUS_CENTRAL | Status: DC | PRN
  Filled 2012-02-17: qty 1000

## 2012-02-17 MED ORDER — VANCOMYCIN HCL IN DEXTROSE 1-5 GM/200ML-% IV SOLN
1000.0000 mg | Freq: Once | INTRAVENOUS | Status: AC
  Administered 2012-02-17: 1000 mg via INTRAVENOUS
  Filled 2012-02-17: qty 200

## 2012-02-17 MED ORDER — CISATRACURIUM BESYLATE (PF) 10 MG/5ML IV SOLN
INTRAVENOUS | Status: DC | PRN
  Administered 2012-02-17: 8 mg via INTRAVENOUS

## 2012-02-17 MED ORDER — PROPOFOL 10 MG/ML IV EMUL
INTRAVENOUS | Status: DC | PRN
  Administered 2012-02-17: 150 mg via INTRAVENOUS

## 2012-02-17 MED ORDER — SODIUM BICARBONATE 8.4 % IV SOLN
INTRAVENOUS | Status: DC
  Filled 2012-02-17 (×3): qty 150

## 2012-02-17 MED ORDER — SODIUM CHLORIDE 0.9 % IV SOLN
INTRAVENOUS | Status: DC | PRN
  Administered 2012-02-17: 10:00:00 via INTRAVENOUS

## 2012-02-17 MED ORDER — SODIUM BICARBONATE 8.4 % IV SOLN
INTRAVENOUS | Status: AC
  Filled 2012-02-17: qty 100

## 2012-02-17 MED ORDER — ALTEPLASE 2 MG IJ SOLR
2.0000 mg | Freq: Once | INTRAMUSCULAR | Status: AC | PRN
  Filled 2012-02-17: qty 2

## 2012-02-17 MED ORDER — SODIUM CHLORIDE 0.9 % IV SOLN
INTRAVENOUS | Status: DC
  Administered 2012-02-17: 125 mL/h via INTRAVENOUS

## 2012-02-17 MED ORDER — PRISMASOL BGK 0/2.5 32-2.5 MEQ/L IV SOLN
INTRAVENOUS | Status: DC
  Administered 2012-02-17 – 2012-02-18 (×4): via INTRAVENOUS_CENTRAL
  Filled 2012-02-17 (×13): qty 5000

## 2012-02-17 MED ORDER — STERILE WATER FOR IRRIGATION IR SOLN
Status: DC | PRN
  Administered 2012-02-17: 3000 mL

## 2012-02-17 MED ORDER — ONDANSETRON HCL 4 MG/2ML IJ SOLN: 4.0000 mg | INTRAMUSCULAR | Status: DC | PRN

## 2012-02-17 MED ORDER — IOHEXOL 300 MG/ML  SOLN
INTRAMUSCULAR | Status: DC | PRN
  Administered 2012-02-17: 50 mL

## 2012-02-17 MED ORDER — ONDANSETRON HCL 4 MG/2ML IJ SOLN
INTRAMUSCULAR | Status: DC | PRN
  Administered 2012-02-17: 4 mg via INTRAVENOUS

## 2012-02-17 MED ORDER — FENTANYL CITRATE 0.05 MG/ML IJ SOLN
INTRAMUSCULAR | Status: DC | PRN
  Administered 2012-02-17 (×3): 50 ug via INTRAVENOUS

## 2012-02-17 MED ORDER — GLYCOPYRROLATE 0.2 MG/ML IJ SOLN
INTRAMUSCULAR | Status: DC | PRN
  Administered 2012-02-17: .7 mg via INTRAVENOUS

## 2012-02-17 MED ORDER — PIPERACILLIN-TAZOBACTAM IN DEX 2-0.25 GM/50ML IV SOLN
2.2500 g | Freq: Once | INTRAVENOUS | Status: AC
  Administered 2012-02-17: 2.25 g via INTRAVENOUS
  Filled 2012-02-17 (×2): qty 50

## 2012-02-17 MED ORDER — SODIUM CHLORIDE 0.9 % IV SOLN
INTRAVENOUS | Status: DC
  Administered 2012-02-17: 07:00:00 via INTRAVENOUS

## 2012-02-17 MED ORDER — SODIUM BICARBONATE 8.4 % IV SOLN
100.0000 meq | Freq: Once | INTRAVENOUS | Status: AC
  Administered 2012-02-17: 100 meq via INTRAVENOUS

## 2012-02-17 MED ORDER — HEPARIN SODIUM (PORCINE) 1000 UNIT/ML DIALYSIS
1000.0000 [IU] | INTRAMUSCULAR | Status: DC | PRN
  Filled 2012-02-17: qty 2
  Filled 2012-02-17: qty 3
  Filled 2012-02-17: qty 6

## 2012-02-17 MED ORDER — RALTEGRAVIR POTASSIUM 400 MG PO TABS
400.0000 mg | ORAL_TABLET | Freq: Two times a day (BID) | ORAL | Status: DC
  Filled 2012-02-17 (×2): qty 1

## 2012-02-17 MED ORDER — LACTATED RINGERS IV SOLN: INTRAVENOUS | Status: DC

## 2012-02-17 MED ORDER — 0.9 % SODIUM CHLORIDE (POUR BTL) OPTIME
TOPICAL | Status: DC | PRN
  Administered 2012-02-17: 1000 mL

## 2012-02-17 MED ORDER — VANCOMYCIN HCL IN DEXTROSE 1-5 GM/200ML-% IV SOLN
1000.0000 mg | INTRAVENOUS | Status: DC
  Administered 2012-02-18 – 2012-02-20 (×4): 1000 mg via INTRAVENOUS
  Filled 2012-02-17 (×3): qty 200

## 2012-02-17 MED ORDER — ACYCLOVIR 200 MG PO CAPS
400.0000 mg | ORAL_CAPSULE | Freq: Three times a day (TID) | ORAL | Status: DC
  Filled 2012-02-17 (×3): qty 2

## 2012-02-17 MED ORDER — FENTANYL CITRATE 0.05 MG/ML IJ SOLN: 25.0000 ug | INTRAMUSCULAR | Status: DC | PRN

## 2012-02-17 MED ORDER — FENTANYL CITRATE 0.05 MG/ML IJ SOLN
INTRAMUSCULAR | Status: AC
  Filled 2012-02-17: qty 2

## 2012-02-17 MED ORDER — SODIUM CHLORIDE 0.9 % IR SOLN
Status: DC | PRN
  Administered 2012-02-17: 3000 mL

## 2012-02-17 MED ORDER — PROMETHAZINE HCL 25 MG/ML IJ SOLN: 6.2500 mg | INTRAMUSCULAR | Status: DC | PRN

## 2012-02-17 MED ORDER — PARICALCITOL 1 MCG PO CAPS
1.0000 ug | ORAL_CAPSULE | Freq: Every day | ORAL | Status: DC
  Administered 2012-02-18 – 2012-02-22 (×5): 1 ug via ORAL
  Filled 2012-02-17 (×7): qty 1

## 2012-02-17 MED ORDER — VANCOMYCIN HCL IN DEXTROSE 1-5 GM/200ML-% IV SOLN
1000.0000 mg | Freq: Once | INTRAVENOUS | Status: AC
  Administered 2012-02-17: 1000 mg via INTRAVENOUS
  Filled 2012-02-17 (×2): qty 200

## 2012-02-17 MED ORDER — PIPERACILLIN-TAZOBACTAM 3.375 G IVPB 30 MIN
3.3750 g | Freq: Four times a day (QID) | INTRAVENOUS | Status: DC
  Administered 2012-02-17 – 2012-02-20 (×11): 3.375 g via INTRAVENOUS
  Filled 2012-02-17 (×13): qty 50

## 2012-02-17 MED ORDER — STERILE WATER FOR INJECTION IV SOLN
INTRAVENOUS | Status: DC
  Administered 2012-02-17 (×2): via INTRAVENOUS
  Filled 2012-02-17 (×4): qty 850

## 2012-02-17 MED ORDER — NEOSTIGMINE METHYLSULFATE 1 MG/ML IJ SOLN
INTRAMUSCULAR | Status: DC | PRN
  Administered 2012-02-17: 5 mg via INTRAVENOUS

## 2012-02-17 MED ORDER — LIDOCAINE HCL (CARDIAC) 20 MG/ML IV SOLN
INTRAVENOUS | Status: DC | PRN
  Administered 2012-02-17: 65 mg via INTRAVENOUS

## 2012-02-17 MED ORDER — STERILE WATER FOR INJECTION IV SOLN
INTRAVENOUS | Status: DC
  Administered 2012-02-18 (×6): via INTRAVENOUS_CENTRAL
  Filled 2012-02-17 (×13): qty 150

## 2012-02-17 MED ORDER — PIPERACILLIN-TAZOBACTAM IN DEX 2-0.25 GM/50ML IV SOLN
2.2500 g | Freq: Four times a day (QID) | INTRAVENOUS | Status: DC
  Administered 2012-02-17: 2.25 g via INTRAVENOUS
  Filled 2012-02-17 (×9): qty 50

## 2012-02-17 MED ORDER — MORPHINE SULFATE 2 MG/ML IJ SOLN
2.0000 mg | INTRAMUSCULAR | Status: DC | PRN
  Administered 2012-02-18 – 2012-02-28 (×6): 2 mg via INTRAVENOUS
  Administered 2012-03-01: 4 mg via INTRAVENOUS
  Administered 2012-03-01: 2 mg via INTRAVENOUS
  Filled 2012-02-17: qty 2
  Filled 2012-02-17 (×7): qty 1

## 2012-02-17 MED ORDER — IOHEXOL 300 MG/ML  SOLN
INTRAMUSCULAR | Status: AC
  Filled 2012-02-17: qty 1

## 2012-02-17 MED ORDER — STERILE WATER FOR INJECTION IV SOLN
INTRAVENOUS | Status: DC
  Administered 2012-02-17 – 2012-02-18 (×3): via INTRAVENOUS_CENTRAL
  Filled 2012-02-17 (×9): qty 150

## 2012-02-17 MED ORDER — VANCOMYCIN HCL 1000 MG IV SOLR: 1500.0000 mg | INTRAVENOUS | Status: DC

## 2012-02-17 SURGICAL SUPPLY — 33 items
ADAPTER CATH URET PLST 4-6FR (CATHETERS) ×4 IMPLANT
ADPR CATH URET STRL DISP 4-6FR (CATHETERS) ×3
BAG URINE DRAINAGE (UROLOGICAL SUPPLIES) ×2 IMPLANT
BAG URO CATCHER STRL LF (DRAPE) ×2 IMPLANT
BALLN NEPHROSTOMY (BALLOONS) ×4
BALLOON NEPHROSTOMY (BALLOONS) ×1 IMPLANT
BANDAGE GAUZE ELAST BULKY 4 IN (GAUZE/BANDAGES/DRESSINGS) ×4 IMPLANT
BLADE HEX COATED 2.75 (ELECTRODE) ×2 IMPLANT
CATH FOLEY 2W COUNCIL 5CC 16FR (CATHETERS) ×2 IMPLANT
CATH URET 5FR 28IN OPEN ENDED (CATHETERS) ×2 IMPLANT
CLOTH BEACON ORANGE TIMEOUT ST (SAFETY) ×4 IMPLANT
DRAIN PENROSE 18X1/2 LTX STRL (DRAIN) ×4 IMPLANT
DRSG PAD ABDOMINAL 8X10 ST (GAUZE/BANDAGES/DRESSINGS) ×6 IMPLANT
GAUZE SPONGE 4X4 16PLY XRAY LF (GAUZE/BANDAGES/DRESSINGS) ×6 IMPLANT
GLOVE BIO SURGEON STRL SZ7.5 (GLOVE) ×4 IMPLANT
GLOVE BIOGEL M STRL SZ7.5 (GLOVE) ×4 IMPLANT
GOWN PREVENTION PLUS XLARGE (GOWN DISPOSABLE) ×4 IMPLANT
GOWN STRL REIN XL XLG (GOWN DISPOSABLE) ×4 IMPLANT
GUIDEWIRE STR DUAL SENSOR (WIRE) ×4 IMPLANT
HANDPIECE INTERPULSE COAX TIP (DISPOSABLE) ×4
HOLDER FOLEY CATH W/STRAP (MISCELLANEOUS) ×4 IMPLANT
PACK CYSTO (CUSTOM PROCEDURE TRAY) ×4 IMPLANT
PENCIL BUTTON HOLSTER BLD 10FT (ELECTRODE) ×2 IMPLANT
SET HNDPC FAN SPRY TIP SCT (DISPOSABLE) ×1 IMPLANT
SHEET LAVH (DRAPES) ×2 IMPLANT
SUT VIC AB 3-0 SH 27 (SUTURE) ×4
SUT VIC AB 3-0 SH 27XBRD (SUTURE) ×1 IMPLANT
SWAB COLLECTION DEVICE MRSA (MISCELLANEOUS) ×4 IMPLANT
SYR 20CC LL (SYRINGE) ×4 IMPLANT
TOWEL OR NON WOVEN STRL DISP B (DISPOSABLE) ×2 IMPLANT
TUBE ANAEROBIC SPECIMEN COL (MISCELLANEOUS) ×4 IMPLANT
TUBING CONNECTING 10 (TUBING) ×4 IMPLANT
YANKAUER SUCT BULB TIP NO VENT (SUCTIONS) ×2 IMPLANT

## 2012-02-17 NOTE — Progress Notes (Signed)
Per request of patient- Pt's nephew,  Bryan Gilmore, notified by phone  that pt is here and being transferred to [address] at [facility] for possible dialysis.Anthonette Legato

## 2012-02-17 NOTE — Consult Note (Signed)
Urology Consult  Referring physician: Molpus Reason for referral: Stricture/fournieres  Chief Complaint: Stricture; can't pass catheter  History of Present Illness: 50 male; home on bactrim; swollen penis and scrotum; could not pass catheter; air on CT scan; now to be in acute on chronic renal failure; difficulty urinating for 2 months HIV disease; intermittent rt flank and abdomina lpain; radiates to testicle on right More difficult to void x one week Many answers to questions? Accurate ? Previous GU surgery and UTI Modifying factors: There are no other modifying factors  Associated signs and symptoms: There are no other associated signs and symptoms Aggravating and relieving factors: There are no other aggravating or relieving factors Severity: Moderate Duration: Persistent   Past Medical History  Diagnosis Date  . Hypertension   . Renal disorder   . HIV (human immunodeficiency virus infection)    History reviewed. No pertinent past surgical history.  Medications: I have reviewed the patient's current medications. Allergies: No Known Allergies  History reviewed. No pertinent family history. Social History:  reports that he has never smoked. He has never used smokeless tobacco. He reports that he does not drink alcohol or use illicit drugs.  ROS: All systems are reviewed and negative except as noted.   Physical Exam:  Vital signs in last 24 hours: Temp:  [97.6 F (36.4 C)-98 F (36.7 C)] 97.6 F (36.4 C) (06/08 0734) Pulse Rate:  [88-95] 88  (06/08 0734) Resp:  [18-26] 23  (06/08 0734) BP: (137-156)/(66-94) 137/66 mmHg (06/08 0734) SpO2:  [94 %-100 %] 100 % (06/08 0734)  Cardiovascular: Skin warm; not flushed Respiratory: Breaths quiet; no shortness of breath Abdomen: No masses Neurological: Normal sensation to touch Musculoskeletal: Normal motor function arms and legs Lymphatics: No inguinal adenopathy Skin: No rashes Genitourinary:Mild swellling perineum and rt  scrotum; firm area anterior to rectum on DRE; belly benign  Laboratory Data:  Results for orders placed during the hospital encounter of 02/17/12 (from the past 72 hour(s))  BASIC METABOLIC PANEL     Status: Abnormal   Collection Time   02/17/12  6:16 AM      Component Value Range Comment   Sodium 129 (*) 135 - 145 (mEq/L)    Potassium 5.8 (*) 3.5 - 5.1 (mEq/L)    Chloride 97  96 - 112 (mEq/L)    CO2 7 (*) 19 - 32 (mEq/L)    Glucose, Bld 93  70 - 99 (mg/dL)    BUN 161 (*) 6 - 23 (mg/dL)    Creatinine, Ser 0.96 (*) 0.50 - 1.35 (mg/dL)    Calcium 9.0  8.4 - 10.5 (mg/dL)    GFR calc non Af Amer 6 (*) >90 (mL/min)    GFR calc Af Amer 7 (*) >90 (mL/min)   CBC     Status: Abnormal (Preliminary result)   Collection Time   02/17/12  6:16 AM      Component Value Range Comment   WBC PENDING  4.0 - 10.5 (K/uL)    RBC 3.06 (*) 4.22 - 5.81 (MIL/uL)    Hemoglobin 9.1 (*) 13.0 - 17.0 (g/dL) REPEATED TO VERIFY   HCT 26.3 (*) 39.0 - 52.0 (%)    MCV 85.9  78.0 - 100.0 (fL)    MCH 29.7  26.0 - 34.0 (pg)    MCHC 34.6  30.0 - 36.0 (g/dL)    RDW 04.5  40.9 - 81.1 (%)    Platelets PENDING  150 - 400 (K/uL)   DIFFERENTIAL  Status: Normal (Preliminary result)   Collection Time   02/17/12  6:16 AM      Component Value Range Comment   Neutrophils Relative PENDING  43 - 77 (%)    Neutro Abs PENDING  1.7 - 7.7 (K/uL)    Band Neutrophils PENDING  0 - 10 (%)    Lymphocytes Relative PENDING  12 - 46 (%)    Lymphs Abs PENDING  0.7 - 4.0 (K/uL)    Monocytes Relative PENDING  3 - 12 (%)    Monocytes Absolute PENDING  0.1 - 1.0 (K/uL)    Eosinophils Relative PENDING  0 - 5 (%)    Eosinophils Absolute PENDING  0.0 - 0.7 (K/uL)    Basophils Relative PENDING  0 - 1 (%)    Basophils Absolute PENDING  0.0 - 0.1 (K/uL)    WBC Morphology PENDING      RBC Morphology PENDING      Smear Review PENDING      nRBC PENDING  0 (/100 WBC)    Metamyelocytes Relative PENDING      Myelocytes PENDING      Promyelocytes  Absolute PENDING      Blasts PENDING     PROCALCITONIN     Status: Normal   Collection Time   02/17/12  6:20 AM      Component Value Range Comment   Procalcitonin 10.04     LACTIC ACID, PLASMA     Status: Normal   Collection Time   02/17/12  6:51 AM      Component Value Range Comment   Lactic Acid, Venous 1.7  0.5 - 2.2 (mmol/L)    No results found for this or any previous visit (from the past 240 hour(s)). Creatinine:  Basename 02/17/12 0616  CREATININE 8.63*    Xrays: See report/chart Reviewed in person with radiology- agree could be anal souorse  Impression/Assessment:  Perirectal/perinal abscess Very acidotic Renal failure Fourniere's General surgery/urology to OR Medicine has seen  Plan:  See above  Zebulin Siegel A 02/17/2012, 7:51 AM

## 2012-02-17 NOTE — Op Note (Signed)
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 Lorin Picket Aurelie Dicenzo Intraoperative consultation: Dr. Odie Sera  The patient presented with the inability to pass a catheter and CT findings with the above diagnoses. The findings were suspicious for perirectal abscess or perforated urethra from a stricture. Preoperative antibiotics were given and medical management by the medical team was provided  Patient was placed in lithotomy position with extra care with leg positioning. He had mild penile edema. He had swelling and induration of the perineum with a little bit of tracking into the scrotum and the right inguinal area on the right side it was minimal.  I cystoscoped the patient and he had a mid penile urethral stricture.  I performed a retrograde urethrogram with the patient in the anterior posterior position with approximately 20 cc of contrast. He had narrowing near the bulbar urethra he probably had a little bit of extravasation though sometimes the finding can be from some dye on the skin. The dye entered the bladder easily. The bladder was modestly full  Sensor wire was easily placed fluoroscopically. Balloon dilation catheter was placed. I dilated utilizing the markers the proximal two thirds of the urethra for 4 minutes at 5 atmospheres of pressure. I partially deflated the balloon and in pulled it distally and dilated the penile urethra at the level of the meatus. I deflated the balloon. I placed a 16 French Councill catheter quite easily into the bladder removing the wire and had had excellent clear drainage of urine  It was obvious a perineal  incision had been made from the anus to the penoscrotal junction bivalving the scrotum. There was dark and somewhat necrotic-looking subcutaneous tissue. There was brownish fluid non-foul smelling culture. As I dissected more towards the rectum he had necrotic bulbar spongiosis muscle and perirectal tissue. I did a rectal examination and I could see healthy rectal wall with no injury. The necrosis tract cephalad in the space between the bladder and rectum.  Dr. Odie Sera assess the patient prior to the incision but I had him come in and again. He agreed that the rectum was okay though it could be a perirectal process tracking into the retroperitoneum as noted on CT scan. Necrotic tissue was easily removed. Copious irrigation was utilized this level. Large Penrose was placed deep in the pocket and sutured in place with one interrupted 3-0 Vicryl the level of the skin.  Was dusky subcutaneous tissue in the scrotum with the it was soft and I could easily feel the testicles. I opened this up as well and there was necrotic tissue that was milder. I trimmed a few centimeters of scrotal skin on the patient's left and on the right.  I finger dissected to the inguinal areas bilaterally making a small stab incision approximately the size of my middle finger. I did the same for the penile skin. I opened up the penile skin but there was no necrosis and in my opinion no abscess collection  A lot of irrigation was utilized. The pulse evacuator utilized. A Penrose was placed through each angle incision and brought through the perineal incision. Wet to dry dressing was applied. Tissues actually looked quite healthy. There was a lot of perineal skin of the could be some demarcation later on velocities the skin. Leg position  was good. Blood loss was less than 100 mL. The patient is HIV-positive. The patient be taken to the surgical intensive care unit

## 2012-02-17 NOTE — Progress Notes (Signed)
CRITICAL VALUE ALERT  Critical value received: CO2 9  Date of notification:  02/17/2012  Time of notification:  1350  Critical value read back:yes  Nurse who received alert: Breanna Mcdaniel Hipolito Bayley  MD notified (1st page): Dr. Darnelle Catalan  Time of first page: 1350  MD notified (2nd page):n/a  Time of second page:n/a  Responding MD: Dr. Darnelle Catalan  Time MD responded:  1351

## 2012-02-17 NOTE — Progress Notes (Addendum)
Called to examine the patient post-operatively.  His heart rate is now irregular, he has tenuous IV access, he vomited or coughed up a large blood clot, his bicarb is only 9 and he is acidotic and at risk for further decompensation.  Spoke with Dr. Molli Knock about this patient about line access, but he is currently tied up at [facility].  Spoke with Dr. Arlean Hopping about obtaining a nephrology consultation given his significantly impaired renal function, hyperkalemia, and metabolic acidosis.  He will need to be transferred to [facility] for dialysis.  I updated Dr. Sherron Monday about this plan.  He will be transferred under the care of PCCM.  Bryan Gilmore 02/17/2012 2:04 PM   This patient is critically ill.  I spent an additional 40 minutes stabilizing him and coordinating his care with other treatment team members.

## 2012-02-17 NOTE — Transfer of Care (Signed)
Immediate Anesthesia Transfer of Care Note  Patient: Bryan Gilmore  Procedure(s) Performed: Procedure(s) (LRB): CYSTOSCOPY WITH URETHRAL DILATATION (N/A) INCISION AND DRAINAGE ABSCESS (N/A)  Patient Location: PACU  Anesthesia Type: General  Level of Consciousness: sedated, patient cooperative and responds to stimulaton  Airway & Oxygen Therapy: Patient Spontanous Breathing and Patient connected to face mask oxgen  Post-op Assessment: Report given to PACU RN and Post -op Vital signs reviewed and stable  Post vital signs: Reviewed and stable  Complications: No apparent anesthesia complications

## 2012-02-17 NOTE — Consult Note (Signed)
Internal Medicine Consultation Note Date: 02/17/2012  Patient name: Bryan Gilmore Medical record number: [id number] Date of birth: [birth year] Age: 61 y.o. Gender: male PCP: DEFAULT,PROVIDER, MD, MD  Attending physician: Martina Sinner, MD Consult requested by: Dr. Molpus/Dr. MacDiarmid Emergency Contact: [facility] [phone] Code Status:  Full  Chief Complaint: "My testicles were hurting"  History of Present Illness: Bryan Gilmore is an 61 y.o. male with a PMH of HIV who reports 1 week of testicular discomfort and swelling.  No associated fever or chills.  The patient had dysuria with difficulty urinating for 1 week prior to admission.  He was seen in the ER 02/13/12 for complaints of groin pain, scrotal swelling radiating to buttocks,  and was diagnosed with a UTI based on an abnormal urinalysis with too numerous to count WBCs, 11-20 RBCs, but no mention of bacteria.  Cultures were not sent.  Mild paraphimosis was noted on his physical exam, and he was discharged with a prescription for Bactrim.  He returned to the ER with similar symptoms, not improved by antibiotics with symptoms progressing to the point where now he complains of inability to pass his urine.  He also has intermittent right flank pain radiating to right testicle.  Upon initial evaluation in the ER, a CT scan of the abdomen and pelvis was obtained to evaluate significant scrotal and penile edema, and the scans were concerning for Fornier's gangrene. The ER physician was not able to pass a foley catheter.  Emergent urological evaluation has been requested by the ED physician, and the hospitalist service was asked to consult to provide guidance for his medical issues.  Past Medical History Past Medical History  Diagnosis Date  . Hypertension   . Renal disorder   . HIV (human immunodeficiency virus infection)     Past Surgical History History reviewed. No pertinent past surgical  history.  Meds: Prior to Admission medications   Medication Sig Start Date End Date Taking? Authorizing Provider  acyclovir (ZOVIRAX) 200 MG capsule Take 2 capsules (400 mg total) by mouth 3 (three) times daily. 02/13/12 02/23/12 Yes Charles B. Bernette Mayers, MD  allopurinol (ZYLOPRIM) 100 MG tablet Take 300 mg by mouth daily.   Yes Historical Provider, MD  emtricitabine-tenofovir (TRUVADA) 200-300 MG per tablet Take 1 tablet by mouth every Monday, Wednesday, and Friday.     Yes Historical Provider, MD  ibuprofen (ADVIL,MOTRIN) 800 MG tablet Take 800 mg by mouth every 8 (eight) hours as needed. For gout flare up   Yes Historical Provider, MD  Nebivolol HCl 20 MG TABS Take 20 mg by mouth daily.   Yes Historical Provider, MD  paricalcitol (ZEMPLAR) 1 MCG capsule Take 1 mcg by mouth daily.     Yes Historical Provider, MD  raltegravir (ISENTRESS) 400 MG tablet Take 400 mg by mouth 2 (two) times daily.     Yes Historical Provider, MD  sulfamethoxazole-trimethoprim (BACTRIM DS) 800-160 MG per tablet Take 1 tablet by mouth 2 (two) times daily. 02/13/12 02/23/12 Yes Charles B. Bernette Mayers, MD    Allergies: Review of patient's allergies indicates no known allergies.  Social History: History   Social History  . Marital Status: Single    Spouse Name: N/A    Number of Children: 0  . Years of Education: N/A   Occupational History  . Custodian [employer]   Social History Main Topics  . Smoking status: Never Smoker   . Smokeless tobacco: Never Used  . Alcohol Use: Yes  1 beer 2-3 x per month  . Drug Use: No  . Sexually Active: No   Other Topics Concern  . Not on file   Social History Narrative   Single.  Unemployed.  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      Family History:  Family History  Problem Relation Age of Onset  . Hypertension Mother   . Hypertension Father     Review of Systems: Constitutional: No fever, no chills;  Appetite diminished; No weight loss, no weight  gain.  HEENT: No blurry vision, no diplopia, no pharyngitis, no dysphagia CV: No chest pain, no palpitations.  Resp: No SOB, no cough. GI: No nausea, no vomiting, no diarrhea, no melena, no hematochezia.  GU: + dysuria, + hematuria.  MSK: no myalgias, no arthralgias.  Neuro:  No headache, no focal neurological deficits, no history of seizures.  Psych: No depression, no anxiety.  Endo: No thyroid disease, no DM, no heat intolerance, no cold intolerance, no polyuria, no polydipsia  Skin: No rashes, no skin lesions.  Heme: No easy bruising, no history of blood diseases.   Physical Exam: Blood pressure 137/66, pulse 88, temperature 97.6 F (36.4 C), temperature source Oral, resp. rate 23, SpO2 100.00%. BP 137/66  Pulse 88  Temp(Src) 97.6 F (36.4 C) (Oral)  Resp 23  SpO2 100%  General Appearance:    Mildly lethargic, cooperative, no distress, appears stated age  Head:    Normocephalic, without obvious abnormality, atraumatic  Eyes:    PERRL, conjunctiva/corneas clear, EOM's intact     Ears:    Normal external ear canals, both ears  Nose:   Nares normal, septum midline, mucosa normal, no drainage    or sinus tenderness  Throat:   Lips, mucosa, and tongue normal; poor dentition with caries, missing and broken teeth.  Neck:   Supple, symmetrical, trachea midline, no adenopathy;       thyroid:  No enlargement/tenderness/nodules; no carotid   bruit or JVD  Back:     Symmetric, no curvature, ROM normal, no CVA tenderness  Lungs:     Clear to auscultation bilaterally, respirations unlabored  Chest wall:    No tenderness or deformity  Heart:    Regular rate and rhythm, S1 and S2 normal, no murmur, rub   or gallop  Abdomen:     Softly distended, non-tender, bowel sounds active all four quadrants, no masses, no organomegaly  Genitalia:    + scrotal and penile edema.  Blood around meatus.  Extremities:   Extremities normal, atraumatic, no cyanosis or edema  Pulses:   2+ and symmetric all extremities   Skin:   Skin color, texture, turgor normal, no rashes or lesions  Lymph nodes:   Cervical, supraclavicular, and axillary nodes normal  Neurologic:   CNII-XII intact. Normal strength.   Lab results: Basic Metabolic Panel:  Lab 02/17/12 1610  NA 129*  K 5.8*  CL 97  CO2 7*  GLUCOSE 93  BUN 114*  CREATININE 8.63*  CALCIUM 9.0  MG --  PHOS --   GFR The CrCl is unknown because both a height and weight (above a minimum accepted value) are required for this calculation.  CBC:  Lab 02/17/12 0616  WBC PENDING  NEUTROABS PENDING  HGB 9.1*  HCT 26.3*  MCV 85.9  PLT PENDING    Ref. Range 02/17/2012 06:20 02/17/2012 06:51  Lactic Acid, Venous Latest Range: 0.5-2.2 mmol/L  1.7  Procalcitonin No range found 10.04  Imaging results:   Ct Abdomen Wo Contrast 02/17/2012 IMPRESSION: Findings are concerning for Fournier gangrene, with gas noted to track along soft tissues of the penis, right hemiscrotum, perineum, right thigh, and left retroperitoneum.  There is a perianal gas collection which may reflect a perianal abscess as the potential source of infection. Emergent urologic consultation recommended.  Critical Value/emergent results were called by telephone at the time of interpretation on February 17, 2012  at 06:15 a.m.  to  Dr. Read Drivers, who verbally acknowledged these results.  1.9 cm interpolar left renal lesion with thin peripheral calcification. Recommend ultrasound follow-up.  Original Report Authenticated By: Waneta Martins, M.D.    Assessment & Plan: Principal Problem:  *Sepsis secondary to suspected Fornier's gangrene  Procalcitonin elevation, ARF, consistent with sepsis.  Emergent surgical evaluation per Dr. Sherron Monday and Dr. Johna Sheriff.  Broad antibiotic coverage with Zosyn and Vancomycin to cover gram negative organisms and MRSA.  Monitor hemodynamic status closely.  Check blood cultures. Active Problems:  HIV INFECTION  Would continue Zovirax, Truvada,  Isentress.  Last CD4 count was 440, 23% on 04/17/11.  Repeat CD4 count.  ARF (acute renal failure) in the setting of stage III CKD  Baseline creatinine 2.47-2.78.  ARF likely from post-obstructive causes and sepsis.  Hydrate and monitor.  Relieve urinary obstruction.  Metabolic acidosis  Likely being driven by renal failure given his normal lactate.  Start IVF containing bicarbonate.  Hyperkalemia  Mild.  Should correct with improved renal function, hydration.  Normocytic anemia  Likely anemia of chronic kidney disease.  Hyponatremia  Likely from dehydration.  Would hydrate and monitor.  Prophylaxis: Would use Lovenox for DVT prophylaxis.  Time Spent On Consultation: 1 hour.  Jolina Symonds 02/17/2012, 8:10 AM Pager [phone]

## 2012-02-17 NOTE — Progress Notes (Signed)
CRITICAL VALUE ALERT  Critical value received: hgb 6.2    Date of notification:  02/17/2012   Time of notification: 1951  Critical value read back:yes  Nurse who received alert: Effie Berkshire  MD notified (1st page):  Dr. Vassie Loll  Time of first page:  1951  MD notified (2nd page):  Time of second page:  Responding MD:  Dr. Vassie Loll   Time MD responded: [phone]

## 2012-02-17 NOTE — ED Notes (Signed)
Daphine Deutscher, NT attempted 14 fr foley without success. Molpus, MD was notified and asked to try 18 coude. Joselyn Glassman, RN and Dr. Read Drivers were both unsuccessful in 2 separate attempts. Dr. Read Drivers stated he will consult with urologist.

## 2012-02-17 NOTE — ED Notes (Signed)
Pt Has Urology MD at bedside to see patient. Pt to be sent to OR.

## 2012-02-17 NOTE — Anesthesia Preprocedure Evaluation (Signed)
Anesthesia Evaluation  Patient identified by MRN, date of birth, ID band Patient confused    Reviewed: Allergy & Precautions, H&P , NPO status , Patient's Chart, lab work & pertinent test results, Unable to perform ROS - Chart review only  Airway Mallampati: III TM Distance: >3 FB Neck ROM: Full    Dental  (+) Missing, Chipped, Poor Dentition, Dental Advisory Given and Teeth Intact,    Pulmonary neg pulmonary ROS,  breath sounds clear to auscultation  Pulmonary exam normal       Cardiovascular hypertension, Rhythm:Regular Rate:Normal     Neuro/Psych PSYCHIATRIC DISORDERS negative neurological ROS  negative psych ROS   GI/Hepatic negative GI ROS, (+) Hepatitis -, C  Endo/Other  negative endocrine ROS  Renal/GU ARF and Renal InsufficiencyRenal disease  negative genitourinary   Musculoskeletal negative musculoskeletal ROS (+)   Abdominal   Peds  Hematology  (+) HIV,   Anesthesia Other Findings   Reproductive/Obstetrics negative OB ROS                           Anesthesia Physical Anesthesia Plan  ASA: IV and Emergent  Anesthesia Plan: General   Post-op Pain Management:    Induction: Intravenous  Airway Management Planned: Oral ETT  Additional Equipment:   Intra-op Plan:   Post-operative Plan: Extubation in OR  Informed Consent: I have reviewed the patients History and Physical, chart, labs and discussed the procedure including the risks, benefits and alternatives for the proposed anesthesia with the patient or authorized representative who has indicated his/her understanding and acceptance.   Dental advisory given  Plan Discussed with: CRNA  Anesthesia Plan Comments:         Anesthesia Quick Evaluation

## 2012-02-17 NOTE — Consult Note (Addendum)
Name: Bryan Gilmore MRN: [id number] DOB: 12-Dec-1950    LOS: 0  Referring Provider:  Urology Reason for Referral:  Respiratory failure and acidosis.  PULMONARY / CRITICAL CARE MEDICINE  HPI:  61 year old male with HIV PMH presented to [facility] with 1 wk history of testicular discomfort and swelling, dysuria and difficulty urinating. In ER was noted to have Fournier gangrene and perineal abscesses and was taken to the OR for surgical correction 6/8.  Patient was also noted to have evolving renal failure and profound acidosis.  Renal was consulted and recommended CVVH and pt was tx to [facility] under PCCM.   Past Medical History  Diagnosis Date  . Hypertension   . Renal disorder   . HIV (human immunodeficiency virus infection)    History reviewed. No pertinent past surgical history. Prior to Admission medications   Medication Sig Start Date End Date Taking? Authorizing Provider  acyclovir (ZOVIRAX) 200 MG capsule Take 2 capsules (400 mg total) by mouth 3 (three) times daily. 02/13/12 02/23/12 Yes Charles B. Bernette Mayers, MD  allopurinol (ZYLOPRIM) 100 MG tablet Take 300 mg by mouth daily.   Yes Historical Provider, MD  emtricitabine-tenofovir (TRUVADA) 200-300 MG per tablet Take 1 tablet by mouth every Monday, Wednesday, and Friday.     Yes Historical Provider, MD  ibuprofen (ADVIL,MOTRIN) 800 MG tablet Take 800 mg by mouth every 8 (eight) hours as needed. For gout flare up   Yes Historical Provider, MD  Nebivolol HCl 20 MG TABS Take 20 mg by mouth daily.   Yes Historical Provider, MD  paricalcitol (ZEMPLAR) 1 MCG capsule Take 1 mcg by mouth daily.     Yes Historical Provider, MD  raltegravir (ISENTRESS) 400 MG tablet Take 400 mg by mouth 2 (two) times daily.     Yes Historical Provider, MD  sulfamethoxazole-trimethoprim (BACTRIM DS) 800-160 MG per tablet Take 1 tablet by mouth 2 (two) times daily. 02/13/12 02/23/12 Yes Charles B. Bernette Mayers, MD   Allergies No Known Allergies  Family History Family History    Problem Relation Age of Onset  . Hypertension Mother   . Hypertension Father    Social History  reports that he has never smoked. He has never used smokeless tobacco. He reports that he drinks alcohol. He reports that he does not use illicit drugs.  Review Of Systems:  C/o post op pain, mild SOB.  All other systems reviewed and were neg.    Vital Signs: Temp:  [97.4 F (36.3 C)-98 F (36.7 C)] 97.4 F (36.3 C) (06/08 1400) Pulse Rate:  [64-95] 86  (06/08 1515) Resp:  [14-26] 15  (06/08 1515) BP: (115-164)/(60-99) 124/74 mmHg (06/08 1500) SpO2:  [84 %-100 %] 100 % (06/08 1515) Weight:  [108.6 kg (239 lb 6.7 oz)-109 kg (240 lb 4.8 oz)] 108.6 kg (239 lb 6.7 oz) (06/08 1400)  Physical Examination: General: chronically ill appearing male, mild distress   Neuro: awake, lethargic and confused at times   CV: s1s2 rrr PULM: resps even non labored, tachypneic, diminished bases, few scattered ronchi GI: abd soft, +bs Extremities: warm and dry, no edema    Principal Problem:  *Sepsis secondary to suspected Fornier's gangrene Active Problems:  HIV INFECTION  ARF (acute renal failure) in the setting of stage III CKD  Metabolic acidosis  Hyperkalemia  Normocytic anemia  Hyponatremia   ASSESSMENT AND PLAN  PULMONARY  Lab 02/17/12 1242  PHART 7.129*  PCO2ART 24.9*  PO2ART 195.0*  HCO3 7.9*  O2SAT 98.4   Ventilator  Settings:   CXR:  6/8>> Clear  ETT:  none  A:  Acute respiratory distress - respiratory compensation in setting metabolic acidosis P:   Tenuous resp status, appears mildly improved for now. Closely monitor. Correct metabolic issues - see below. Will make BiPAP available, my hope is that as dialysis gets underway the respiratory status will no longer be an issue.  CARDIOVASCULAR  Lab 02/17/12 0651  TROPONINI --  LATICACIDVEN 1.7  PROBNP --   ECG:  NSR Lines:   A: SIRS - likely r/t suspected Fournier's gangrene with peroneal and perirectal abscesses   P:  Trend lactate, pct  abx - see below   RENAL  Lab 02/17/12 1230 02/17/12 0616  NA 128* 129*  K 6.1* 5.8*  CL 97 97  CO2 9* 7*  BUN 116* 114*  CREATININE 8.62* 8.63*  CALCIUM 8.5 9.0  MG -- --  PHOS -- --   Intake/Output      06/07 0701 - 06/08 0700 06/08 0701 - 06/09 0700   I.V. (mL/kg)  950 (8.7)   IV Piggyback  250   Total Intake(mL/kg)  1200 (11)   Blood  100   Total Output  100   Net  +1100         Foley:  6/8  A:  Acute on chronic renal failure - ?progression of underlying disease (prob stage IV CKD with baseline SCr 2.47-2.78) c/b ATN in setting SIRS, volume depletion, ?drug toxicity.  Metabolic acidosis  P:   Dialysis per renal. Add HCO3 gtt until dialysis.  GASTROINTESTINAL  Lab 02/17/12 0850  AST 22  ALT 12  ALKPHOS 105  BILITOT 0.6  PROT 7.5  ALBUMIN 2.3*    A:  No acute issues  P:   NPO for now   HEMATOLOGIC  Lab 02/17/12 1230 02/17/12 0616  HGB 8.7* 9.1*  HCT 25.2* 26.3*  PLT 212 212  INR -- --  APTT -- --   A:  Anemia - likely of critical illness and in setting Stage IV CKD  P:  Monitor CBC  INFECTIOUS  Lab 02/17/12 1230 02/17/12 0620 02/17/12 0616  WBC 38.3* -- 27.8*  PROCALCITON -- 10.04 --   Cultures: BCx2 6/8>>> Urine 6/8>>> Wound 6/8>>>  Antibiotics: Zosyn 6/8>>> Vancomycin 6/8>>>  A:  Fournier's gangrene - s/p I/D 6/8.      HIV - last CD4 =440 04/17/11 P:   Repeat CD4 pending  Cont HIV rx  Broad spectrum abx for abscesses  ID consult.  ENDOCRINE  A:   No acute issues  P:   Monitor blood sugars on bmet   NEUROLOGIC  A:  AMS - in setting SIRS and acute on chronic renal failure  P:   Supportive care  Correct metabolic issues   BEST PRACTICE / DISPOSITION Level of Care:  ICU Primary Service:  PCCM Consultants:  renal Code Status:  full Diet:  NPO DVT Px:  SCD's GI Px:  protonix Skin Integrity:  Peroneal wounds/ surgical incisions  Social / Family:  No family available   Danford Bad,  NP 02/17/2012  4:30 PM Pager: [phone]  *Care during the described time interval was provided by me and/or other providers on the critical care team. I have reviewed this patient's available data, including medical history, events of note, physical examination and test results as part of my evaluation.  Impending respiratory failure if not dialyzed soon.  Will start on bicarb drip and antibiotics.  Will consult ID to manage HIV  and fournier's gangrene.  If patient develops septic shock or gets intubated will place TLC in right IJ.  No sepsis protocol for now.  CC time of 50 min.  Patient seen and examined, agree with above note.  I dictated the care and orders written for this patient under my direction.  Koren Bound, M.D. [phone]

## 2012-02-17 NOTE — ED Provider Notes (Addendum)
History     CSN: [id number]  Arrival date & time 02/17/12  0348   First MD Initiated Contact with Patient 02/17/12 [phone]      Chief Complaint  Patient presents with  . Urinary Retention    (Consider location/radiation/quality/duration/timing/severity/associated sxs/prior treatment) HPI This is a 61 year old black male with a history of HIV disease. He complains of difficulty urinating for about a week. He was seen here several days ago and diagnosed with a urinary tract infection. He was placed on Bactrim DS. He states he has not gotten any better and in fact is unable to urinate now. He describes the symptoms as moderate. He has had intermittent right flank pain which is mild and never severe but does radiate to his right testicle. He also has edema of the penis and scrotum, and occasional pain there but not at the present time. He states that his voice has been hoarse and he is having difficulty speaking.  Past Medical History  Diagnosis Date  . Hypertension   . Renal disorder   . HIV (human immunodeficiency virus infection)     History reviewed. No pertinent past surgical history.  History reviewed. No pertinent family history.  History  Substance Use Topics  . Smoking status: Never Smoker   . Smokeless tobacco: Never Used  . Alcohol Use: No      Review of Systems  All other systems reviewed and are negative.    Allergies  Review of patient's allergies indicates no known allergies.  Home Medications   Current Outpatient Rx  Name Route Sig Dispense Refill  . ACYCLOVIR 200 MG PO CAPS Oral Take 2 capsules (400 mg total) by mouth 3 (three) times daily. 15 capsule 0  . ALLOPURINOL 100 MG PO TABS Oral Take 300 mg by mouth daily.    Marland Kitchen EMTRICITABINE-TENOFOVIR 200-300 MG PO TABS Oral Take 1 tablet by mouth every Monday, Wednesday, and Friday.      . IBUPROFEN 800 MG PO TABS Oral Take 800 mg by mouth every 8 (eight) hours as needed. For gout flare up    . NEBIVOLOL HCL  20 MG PO TABS Oral Take 20 mg by mouth daily.    Marland Kitchen PARICALCITOL 1 MCG PO CAPS Oral Take 1 mcg by mouth daily.      Marland Kitchen RALTEGRAVIR POTASSIUM 400 MG PO TABS Oral Take 400 mg by mouth 2 (two) times daily.      . SULFAMETHOXAZOLE-TMP DS 800-160 MG PO TABS Oral Take 1 tablet by mouth 2 (two) times daily. 14 tablet 0    BP 137/66  Pulse 88  Temp(Src) 97.6 F (36.4 C) (Oral)  Resp 23  SpO2 100%  Physical Exam General: Well-developed, well-nourished male in no acute distress or apparent discomfort; appearance consistent with age of record HENT: normocephalic, atraumatic Eyes: pupils equal round and reactive to light; extraocular muscles intact; scarring of the inferior aspect of left cornea Neck: supple Heart: regular rate and rhythm Lungs: clear to auscultation bilaterally Abdomen: soft; nondistended; bowel sounds present; pressure on bladder elicits sensation of needing to void GU: Tanner 4 male uncircumcised; edema of penis and scrotum with mild scrotal tenderness; no crepitus palpated; no flank tenderness Extremities: No deformity; full range of motion Neurologic: Awake, alert; motor function intact in all extremities and symmetric; no facial droop Skin: Warm and dry Psychiatric: Normal mood and affect    ED Course  Procedures (including critical care time)  CRITICAL CARE Performed by: Kain Milosevic L   Total critical care  time: 35  Critical care time was exclusive of separately billable procedures and treating other patients.  Critical care was necessary to treat or prevent imminent or life-threatening deterioration.  Critical care was time spent personally by me on the following activities: development of treatment plan with patient and/or surrogate as well as nursing, discussions with consultants, evaluation of patient's response to treatment, examination of patient, obtaining history from patient or surrogate, ordering and performing treatments and interventions, ordering and  review of laboratory studies, ordering and review of radiographic studies, pulse oximetry and re-evaluation of patient's condition.   MDM   Nursing notes and vitals signs, including pulse oximetry, reviewed.  Summary of this visit's results, reviewed by myself:  Labs:  Results for orders placed during the hospital encounter of 2012-03-18  BASIC METABOLIC PANEL      Component Value Range   Sodium 129 (*) 135 - 145 (mEq/L)   Potassium 5.8 (*) 3.5 - 5.1 (mEq/L)   Chloride 97  96 - 112 (mEq/L)   CO2 7 (*) 19 - 32 (mEq/L)   Glucose, Bld 93  70 - 99 (mg/dL)   BUN 161 (*) 6 - 23 (mg/dL)   Creatinine, Ser 0.96 (*) 0.50 - 1.35 (mg/dL)   Calcium 9.0  8.4 - 04.5 (mg/dL)   GFR calc non Af Amer 6 (*) >90 (mL/min)   GFR calc Af Amer 7 (*) >90 (mL/min)  CBC      Component Value Range   WBC PENDING  4.0 - 10.5 (K/uL)   RBC 3.06 (*) 4.22 - 5.81 (MIL/uL)   Hemoglobin 9.1 (*) 13.0 - 17.0 (g/dL)   HCT 40.9 (*) 81.1 - 52.0 (%)   MCV 85.9  78.0 - 100.0 (fL)   MCH 29.7  26.0 - 34.0 (pg)   MCHC 34.6  30.0 - 36.0 (g/dL)   RDW 91.4  78.2 - 95.6 (%)   Platelets PENDING  150 - 400 (K/uL)  DIFFERENTIAL      Component Value Range   Neutrophils Relative PENDING  43 - 77 (%)   Neutro Abs PENDING  1.7 - 7.7 (K/uL)   Band Neutrophils PENDING  0 - 10 (%)   Lymphocytes Relative PENDING  12 - 46 (%)   Lymphs Abs PENDING  0.7 - 4.0 (K/uL)   Monocytes Relative PENDING  3 - 12 (%)   Monocytes Absolute PENDING  0.1 - 1.0 (K/uL)   Eosinophils Relative PENDING  0 - 5 (%)   Eosinophils Absolute PENDING  0.0 - 0.7 (K/uL)   Basophils Relative PENDING  0 - 1 (%)   Basophils Absolute PENDING  0.0 - 0.1 (K/uL)   WBC Morphology PENDING     RBC Morphology PENDING     Smear Review PENDING     nRBC PENDING  0 (/100 WBC)   Metamyelocytes Relative PENDING     Myelocytes PENDING     Promyelocytes Absolute PENDING     Blasts PENDING    LACTIC ACID, PLASMA      Component Value Range   Lactic Acid, Venous 1.7  0.5 - 2.2  (mmol/L)  PROCALCITONIN      Component Value Range   Procalcitonin 10.04      Imaging Studies: Ct Abdomen Wo Contrast  18-Mar-2012  *RADIOLOGY REPORT*  Clinical Data:  Testicular and flank pain, urinary retention, immunosuppressed.  CT ABDOMEN AND PELVIS WITHOUT CONTRAST  Technique:  Multidetector CT imaging of the abdomen and pelvis was performed following the standard protocol without intravenous contrast.  Comparison:  07/13/2010 ultrasound  Findings:  Cardiomegaly.  Coronary artery calcification.  Low attenuation of the blood pool suggests anemia.  Lung bases are clear.  Degraded by patient motion.  Organ abnormality/lesion detection is limited in the absence of intravenous contrast. Within this limitation, unremarkable liver, spleen, pancreas, adrenal glands. Distended gallbladder.  No pericholecystic fluid or radiodense gallstones. No biliary ductal dilatation.  Atrophic kidneys with perinephric fat stranding.  Focal lesion detection is significantly limited given the motion.  There is suggestion of a 1.9 cm interpolar left renal lesion with thin peripheral calcification (image 26 of series 2).  No hydronephrosis or hydroureter.  No bowel obstruction.  Normal appendix.  Circumferential bladder wall thickening, nonspecific given incomplete distension.  Vas deferens calcifications suggest diabetes.  There is scattered atherosclerotic calcification of the aorta and its branches. No aneurysmal dilatation.  There is peri-anal gas and a more focal pocket measuring 3.2 x 1.3 cm on series 2 image 80. Air tracks superiorly along the left iliac vessels and left para-aortic retroperitoneum.  Inferiorly, extends throughout the perineum, the medial right thigh, the right hemiscrotum and penis, partially imaged.  IMPRESSION: Findings are concerning for Fournier gangrene, with gas noted to track along soft tissues of the penis, right hemiscrotum, perineum, right thigh, and left retroperitoneum.  There is a perianal gas  collection which may reflect a perianal abscess as the potential source of infection. Emergent urologic consultation recommended.  Critical Value/emergent results were called by telephone at the time of interpretation on February 17, 2012  at 06:15 a.m.  to  Dr. Read Drivers, who verbally acknowledged these results.  1.9 cm interpolar left renal lesion with thin peripheral calcification. Recommend ultrasound follow-up.  Original Report Authenticated By: Waneta Martins, M.D.     4:32 AM Patient unable to void voluntarily. We'll place Foley catheter.  5:24 AM Attempted to place a Foley catheter using sterile technique. Unable to pass the catheter more than about 2-1/2 inches before it starts to curl in the urethra. Will obtain CT scan to evaluate.  6:35 AM CT shows free air in soft tissue. Dr. Sherron Monday to see patient in ED. Patient has become more somnolent but is still arousable. He states this is because he has not been sleeping well lately.  7:13 AM Dr. Darnelle Catalan will see in ED as medical consultant. Will start Zosyn and vancomycin after conferring with her.       Hanley Seamen, MD 02/17/12 1610  Hanley Seamen, MD 02/17/12 9604  Hanley Seamen, MD 02/17/12 [phone]

## 2012-02-17 NOTE — Procedures (Signed)
Trialysis Catheter Insertion Procedure Note ADYN SERNA [id number] [birth year]  Procedure: Insertion of Central Venous Catheter Indications: Dialysis Access  Procedure Details Consent: Risks of procedure as well as the alternatives and risks of each were explained to the (patient/caregiver).  Consent for procedure obtained. Time Out: Verified patient identification, verified procedure, site/side was marked, verified correct patient position, special equipment/implants available, medications/allergies/relevent history reviewed, required imaging and test results available.  Performed  Maximum sterile technique was used including antiseptics, cap, gloves, gown, hand hygiene, mask and sheet. Skin prep: Chlorhexidine; local anesthetic administered A antimicrobial bonded/coated triple lumen dialysis catheter was placed in the left internal jugular vein using the Seldinger technique.  Evaluation Blood flow good Complications: No apparent complications Patient did tolerate procedure well. Chest X-ray ordered to verify placement.  CXR: pending.  U/S used in placement.  Gaynor Ferreras 02/17/2012, 4:55 PM

## 2012-02-17 NOTE — ED Notes (Signed)
OR also contacted about patient case. Spoke with OR nurse and they are aware patient will be going to OR

## 2012-02-17 NOTE — Op Note (Signed)
Procedure: Intraoperative consult and exam under anesthesia.  History: This patient is brought to the operating room for Fournier's gangrene. I was asked to evaluate for possible perianal or perirectal source. I reviewed the patient's CT scan preoperatively that shows extensive air through the soft tissues of the hemiscrotum and perineum and some air around the anus as well and then extending into the retroperitoneum.  Exam: The patient was examined under anesthesia in stirrups. There is extensive induration and edema involving the perineum and base of scrotum extending up into the right hemiscrotum. The actual peri-anal area is not particularly indurated or swollen. On digital rectal exam I cannot feel a mass or abscess or significant induration.  Assessment: By exam under anesthesia this does not appear to have a perianal or perirectal source.

## 2012-02-17 NOTE — Progress Notes (Signed)
The ability of this patient to give consent is limited I agree with same conclusion from nurses No family to speak to  This surgery is a life threatening emergency and we need to proceed  Patient verbally consented twice to me

## 2012-02-17 NOTE — Consult Note (Addendum)
Bryan Gilmore 02/17/2012 Bryan Gilmore Requesting Physician:  Dr. Thayer Ohm Rama  Reason for Consult:  Renal failure and Fournier's gangrene HPI: The patient is a 61 y.o. year-old AAM with hx of HTN, HIV and CKD who presented with 1 wk hx of testicular pain and swelling.  He had dysuria and difficultly voiding aldo 1 wk PTA.  Seen in ED on 6/4 and dx'Gilmore with UTI, Gilmore/c'Gilmore on Bactrim. Returned this am and CT scan showed gas in soft tissues of the penis.  Unable to pass foley in ED. Urology saw the patient and noted hx of intermittent R flank pain radiating to R testicle. He was taken to OR today where findings included dark, necrotic -looking tissue on incision of anal and penoscrotal areas. The necrosis tracked cephalad in the space between bladder and rectum.  The patient is now extubated post-op in [facility] ICU. He is quite lethargic, responding with one-work answers.    Creat  Date/Time Value Range Status  04/17/2011  3:21 PM 4.51* 0.50-1.35 (mg/dL) Final   10/25/863  7:84 PM   2.78* 0.40-1.50 (mg/dL) Final  6/96/2952  8:41 PM   2.47* 0.40-1.50 (mg/dL) Final  11/11/4399  0:27 AM   8.63* 0.50-1.35 (mg/dL) Final  10/16/3662 40:34 PM   8.62* 0.50-1.35 (mg/dL) Final    Past Medical History:  Past Medical History  Diagnosis Date  . Hypertension   . Renal disorder   . HIV (human immunodeficiency virus infection)     Past Surgical History: History reviewed. No pertinent past surgical history.  Family History:  Family History  Problem Relation Age of Onset  . Hypertension Mother   . Hypertension Father    Social History:  reports that he has never smoked. He has never used smokeless tobacco. He reports that he drinks alcohol. He reports that he does not use illicit drugs.  Allergies: No Known Allergies  Home medications: Prior to Admission medications   Medication Sig Start Date End Date Taking? Authorizing Provider  acyclovir (ZOVIRAX) 200 MG capsule Take 2 capsules (400 mg total) by mouth 3 (three)  times daily. 02/13/12 02/23/12 Yes Charles B. Bernette Mayers, MD  allopurinol (ZYLOPRIM) 100 MG tablet Take 300 mg by mouth daily.   Yes Historical Provider, MD  emtricitabine-tenofovir (TRUVADA) 200-300 MG per tablet Take 1 tablet by mouth every Monday, Wednesday, and Friday.     Yes Historical Provider, MD  ibuprofen (ADVIL,MOTRIN) 800 MG tablet Take 800 mg by mouth every 8 (eight) hours as needed. For gout flare up   Yes Historical Provider, MD  Nebivolol HCl 20 MG TABS Take 20 mg by mouth daily.   Yes Historical Provider, MD  paricalcitol (ZEMPLAR) 1 MCG capsule Take 1 mcg by mouth daily.     Yes Historical Provider, MD  raltegravir (ISENTRESS) 400 MG tablet Take 400 mg by mouth 2 (two) times daily.     Yes Historical Provider, MD  sulfamethoxazole-trimethoprim (BACTRIM DS) 800-160 MG per tablet Take 1 tablet by mouth 2 (two) times daily. 02/13/12 02/23/12 Yes Charles B. Bernette Mayers, MD    Inpatient medications:    . acyclovir  400 mg Oral TID  . emtricitabine-tenofovir  1 tablet Oral Q M,W,F  . paricalcitol  1 mcg Oral Daily  . piperacillin-tazobactam (ZOSYN)  IV  2.25 g Intravenous Once  . piperacillin-tazobactam (ZOSYN)  IV  2.25 g Intravenous Q6H  . raltegravir  400 mg Oral BID  . sodium bicarbonate  100 mEq Intravenous Once  . vancomycin  1,500 mg Intravenous  Q48H  . vancomycin  1,000 mg Intravenous Once  . vancomycin  1,000 mg Intravenous Once  . DISCONTD: acyclovir  400 mg Oral TID    Review of Systems Poor historian.  Denies hx of renal failure or seeing a kidney doctor.  No fam hx of renal failure, no OTC of analgesics. No SOB or CP, cough. No focal numbness of weakenss.    Labs: Basic Metabolic Panel:  Lab 03/17/2012 1610 17-Mar-2012 0616  NA 128* 129*  K 6.1* 5.8*  CL 97 97  CO2 9* 7*  GLUCOSE 111* 93  BUN 116* 114*  CREATININE 8.62* 8.63*  ALB -- --  CALCIUM 8.5 9.0  PHOS -- --   Liver Function Tests:  Lab 03/17/12 0850  AST 22  ALT 12  ALKPHOS 105  BILITOT 0.6  PROT 7.5    ALBUMIN 2.3*   No results found for this basename: LIPASE:3,AMYLASE:3 in the last 168 hours No results found for this basename: AMMONIA:3 in the last 168 hours CBC:  Lab Mar 17, 2012 1230 03/17/2012 0616  WBC 38.3* 27.8*  NEUTROABS -- 25.5*  HGB 8.7* 9.1*  HCT 25.2* 26.3*  MCV 85.7 85.9  PLT 212 212   PT/INR: @labrcntip (inr:5) Cardiac Enzymes: No results found for this basename: CKTOTAL:5,CKMB:5,CKMBINDEX:5,TROPONINI:5 in the last 168 hours CBG: No results found for this basename: GLUCAP:5 in the last 168 hours  Iron Studies: No results found for this basename: IRON:30,TIBC:30,TRANSFERRIN:30,FERRITIN:30 in the last 168 hours  Xrays/Other Studies: Ct Abdomen Wo Contrast  03-17-2012  *RADIOLOGY REPORT*  Clinical Data:  Testicular and flank pain, urinary retention, immunosuppressed.  CT ABDOMEN AND PELVIS WITHOUT CONTRAST  Technique:  Multidetector CT imaging of the abdomen and pelvis was performed following the standard protocol without intravenous contrast.  Comparison:  07/13/2010 ultrasound  Findings:  Cardiomegaly.  Coronary artery calcification.  Low attenuation of the blood pool suggests anemia.  Lung bases are clear.  Degraded by patient motion.  Organ abnormality/lesion detection is limited in the absence of intravenous contrast. Within this limitation, unremarkable liver, spleen, pancreas, adrenal glands. Distended gallbladder.  No pericholecystic fluid or radiodense gallstones. No biliary ductal dilatation.  Atrophic kidneys with perinephric fat stranding.  Focal lesion detection is significantly limited given the motion.  There is suggestion of a 1.9 cm interpolar left renal lesion with thin peripheral calcification (image 26 of series 2).  No hydronephrosis or hydroureter.  No bowel obstruction.  Normal appendix.  Circumferential bladder wall thickening, nonspecific given incomplete distension.  Vas deferens calcifications suggest diabetes.  There is scattered atherosclerotic  calcification of the aorta and its branches. No aneurysmal dilatation.  There is peri-anal gas and a more focal pocket measuring 3.2 x 1.3 cm on series 2 image 80. Air tracks superiorly along the left iliac vessels and left para-aortic retroperitoneum.  Inferiorly, extends throughout the perineum, the medial right thigh, the right hemiscrotum and penis, partially imaged.  IMPRESSION: Findings are concerning for Fournier gangrene, with gas noted to track along soft tissues of the penis, right hemiscrotum, perineum, right thigh, and left retroperitoneum.  There is a perianal gas collection which may reflect a perianal abscess as the potential source of infection. Emergent urologic consultation recommended.  Critical Value/emergent results were called by telephone at the time of interpretation on 03-17-12  at 06:15 a.m.  to  Dr. Read Drivers, who verbally acknowledged these results.  1.9 cm interpolar left renal lesion with thin peripheral calcification. Recommend ultrasound follow-up.  Original Report Authenticated By: Waneta Martins,  M.Gilmore.   Dg C-arm 1-60 Min  02/17/2012  *RADIOLOGY REPORT*  Clinical Data:  Fournier's gangrene  RETROGRADE URETHROGRAM  Comparison:  None.  Fluoroscopy Time:  45 seconds  This exam was performed by Dr. Sherron Monday.  Findings:  Two spot fluoroscopic views of the lower pelvis were performed.  On image #1, there is a small-bore catheter projecting over the penis, with contrast in the urinary bladder.  Bladder is normal in shape. Contrast is seen with an irregular shape, projecting over the expected location of the penis.  It is uncertain if this is some contrast extravasation within the soft tissues of the penis, distal urethral injury, or contrast contamination outside of the patient.  The second image shows a larger bore Foley catheter satisfactorily positioned, with the balloon inflated in the lower urinary bladder and contrast within the urinary bladder.  The previously described  contrast extravasation over the penile region is no longer present. Question if it could be in a contamination adjacent to the patient. Numerous irregular locules of gas project over the perineum, consistent with the Fournier's gangrene seen by CT.  IMPRESSION: Two intraoperative images are submitted as described above.  Please also see Dr. Mina Marble report.  Original Report Authenticated By: Britta Mccreedy, M.Gilmore.    Physical Exam:  Blood pressure 133/89, pulse 71, temperature 97.4 F (36.3 C), temperature source Oral, resp. rate 14, height 5\' 11"  (1.803 m), weight 108.6 kg (239 lb 6.7 oz), SpO2 99.00%.  Gen: on facemask 02, somnolent, arouseable HEENT: PERRL, EOMI Skin: no rash, cyanosis Neck: full neck veins , no bruits or LAN Chest: coarse BS bilat Heart: regular, no rub or gallop Abdomen: soft, obese nontender mid abdomen, liver down 4 cm, + BS\ Neuro: alert, Ox3, no focal deficit Heme/Lymph: no bruising or LAN  DateC   Creat  09/20/2010  8:08 PM   2.47  11/04/2010  6:55 PM   2.78 04/17/2011  3:21 PM   4.51    02/17/2012  6:16 AM   8.63   02/17/2012 12:30 PM   8.62   UA- >300 prot, 21-50WBC, tntc RBC, many bact CT abd- CM, cor art Ca++, distended GB, atrophic kidneys, no hydro, gas in soft tissues along penis, perineum, R thigh, R scrotum CXR 6/8- normal, NAD   Impression/Plan 1. Acute on chronic renal failure- may be progression of underlying disease, pt has known progressive CKD as above.  Scr was already up to 4.51 in Aug 2012.  Possibilities are HIVAN, HTN'sive renal disease, and others. In the acute phase possibilities are ATN, vol depletion, drug toxicity (tenofovir, ibuprofen, Bactrium). Would hold these meds and plan on transfer pt to [facility] for CRRT tonight due to severe metabolic acidosis and probably uremia as well. CXR  2. Metabolic acidosis- severe 3. Fournier's gangrene- s/p I&Gilmore, on IV abx 4. Volume -  No gross excess, +JVD with clear CXR, no LE edema 5. CKD prob stage IV/V -  see above 6. HIV - per primary 7. Hx of HTN - not on BP meds currently at home 8. Hyperkalemia - K+ 6.1 9. AMS - post-op  Vinson Moselle  MD [facility] [phone] pgr    [phone] cell 02/17/2012, 3:19 PM

## 2012-02-17 NOTE — Progress Notes (Signed)
ANTIBIOTIC CONSULT NOTE - INITIAL  Pharmacy Consult for Vancomycin and Zosyn  Indication:  infection   No Known Allergies  Patient Measurements: Height: 5\' 11"  (180.3 cm) Weight: 240 lb 4.8 oz (109 kg) IBW/kg (Calculated) : 75.3  Adjusted Body Weight:   Vital Signs: Temp: 97.4 F (36.3 C) (06/08 1400) Temp src: Oral (06/08 1400) BP: 147/87 mmHg (06/08 1400) Pulse Rate: 64  (06/08 1400) Intake/Output from previous day:   Intake/Output from this shift: Total I/O In: 725 [I.V.:725] Out: 100 [Blood:100]  Labs:  [facility] 02/17/12 1230 02/17/12 0616  WBC 38.3* 27.8*  HGB 8.7* 9.1*  PLT 212 212  LABCREA -- --  CREATININE 8.62* 8.63*   Estimated Creatinine Clearance: 11.3 ml/min (by C-G formula based on Cr of 8.62). No results found for this basename: VANCOTROUGH:2,VANCOPEAK:2,VANCORANDOM:2,GENTTROUGH:2,GENTPEAK:2,GENTRANDOM:2,TOBRATROUGH:2,TOBRAPEAK:2,TOBRARND:2,AMIKACINPEAK:2,AMIKACINTROU:2,AMIKACIN:2, in the last 72 hours   Microbiology: No results found for this or any previous visit (from the past 720 hour(s)).  Medical History: Past Medical History  Diagnosis Date  . Hypertension   . Renal disorder   . HIV (human immunodeficiency virus infection)     Medications:  Anti-infectives     Start     Dose/Rate Route Frequency Ordered Stop   02/19/12 1200   vancomycin (VANCOCIN) 1,500 mg in sodium chloride 0.9 % 500 mL IVPB        1,500 mg 250 mL/hr over 120 Minutes Intravenous Every 48 hours 02/17/12 1419     02/19/12 1000   emtricitabine-tenofovir (TRUVADA) 200-300 MG per tablet 1 tablet        1 tablet Oral Every M-W-F 02/17/12 0820     02/17/12 1300  piperacillin-tazobactam (ZOSYN) IVPB 2.25 g       2.25 g 100 mL/hr over 30 Minutes Intravenous 4 times per day 02/17/12 1214     02/17/12 1300   vancomycin (VANCOCIN) IVPB 1000 mg/200 mL premix        1,000 mg 200 mL/hr over 60 Minutes Intravenous  Once 02/17/12 1255     02/17/12 1000   acyclovir (ZOVIRAX)  200 MG capsule 400 mg  Status:  Discontinued        400 mg Oral 3 times daily 02/17/12 0820 02/17/12 0833   02/17/12 1000   raltegravir (ISENTRESS) tablet 400 mg        400 mg Oral 2 times daily 02/17/12 0820     02/17/12 1000   acyclovir (ZOVIRAX) tablet 400 mg        400 mg Oral 3 times daily 02/17/12 0832     02/17/12 0730  piperacillin-tazobactam (ZOSYN) IVPB 2.25 g       2.25 g 100 mL/hr over 30 Minutes Intravenous  Once 02/17/12 0716 02/17/12 0951   02/17/12 0730   vancomycin (VANCOCIN) IVPB 1000 mg/200 mL premix        1,000 mg 200 mL/hr over 60 Minutes Intravenous  Once 02/17/12 0716 02/17/12 0854         Assessment: Patient with infection after surgery.  First dose of antibiotics already given in ED.  Renal function very poor  Goal of Therapy:  Vancomycin trough level 15-20 mcg/ml Zosyn based on renal function   Plan:  Measure antibiotic drug levels at steady state Follow up culture results Vancomycin 1gm iv x1 to make 2gm total, then 1500mg  iv q48hr Zosyn 2.25g IV Q6h Will watch renal function and adjust if needed    Aleene Davidson Crowford 02/17/2012,2:19 PM

## 2012-02-17 NOTE — Progress Notes (Signed)
eLink Physician-Brief Progress Note Patient Name: Bryan Gilmore DOB: 05-27-1951 MRN: [id number]  Date of Service  02/17/2012   HPI/Events of Note   Hb 6.2 from 8.7 pre-op  eICU Interventions  1 U PRBC tx   Intervention Category Intermediate Interventions: Bleeding - evaluation and treatment with blood products  Laetitia Schnepf V. 02/17/2012, 8:05 PM

## 2012-02-17 NOTE — ED Notes (Signed)
Per EMS: Pt c/o testicular and flank pain x1 week. Pt was seen at [facility] last week for same symptoms and diagnosed with UTI. Pt also c/o "difficulty urinating". Pt states he has been taking the abx given to him but symptoms remain the same.

## 2012-02-17 NOTE — ED Notes (Signed)
Pt alert to person, place and time and following commands, but tended to become more lethargic as conversation continued. Pt began rambling without making sense, but became more alert again towards the end of conversation and was able to answer questions without issue. Pt reports he lives at home by himself and does not have family in [place]. Pt reports he has no children and has family in [place]. Pt states he has had issues with urine retention x2 months. Pt denies pain currently, but reports he came in with lower abdominal and groin pain.  Pt placed on telemetry monitor. Vitals currently stable.  MD Molpus made aware of patient C02 critical value.

## 2012-02-18 ENCOUNTER — Inpatient Hospital Stay (HOSPITAL_COMMUNITY): Payer: BC Managed Care – PPO

## 2012-02-18 DIAGNOSIS — N179 Acute kidney failure, unspecified: Secondary | ICD-10-CM | POA: Diagnosis not present

## 2012-02-18 DIAGNOSIS — B199 Unspecified viral hepatitis without hepatic coma: Secondary | ICD-10-CM | POA: Diagnosis not present

## 2012-02-18 DIAGNOSIS — A419 Sepsis, unspecified organism: Secondary | ICD-10-CM

## 2012-02-18 DIAGNOSIS — J9600 Acute respiratory failure, unspecified whether with hypoxia or hypercapnia: Secondary | ICD-10-CM | POA: Diagnosis not present

## 2012-02-18 LAB — CBC
HCT: 18 % — ABNORMAL LOW (ref 39.0–52.0)
MCH: 28.9 pg (ref 26.0–34.0)
MCH: 29.2 pg (ref 26.0–34.0)
MCHC: 36 g/dL (ref 30.0–36.0)
MCV: 80.3 fL (ref 78.0–100.0)
MCV: 79.6 fL (ref 78.0–100.0)
Platelets: 184 10*3/uL (ref 150–400)
RDW: 13.1 % (ref 11.5–15.5)
RDW: 13 % (ref 11.5–15.5)
WBC: 19.3 10*3/uL — ABNORMAL HIGH (ref 4.0–10.5)

## 2012-02-18 LAB — RENAL FUNCTION PANEL
BUN: 64 mg/dL — ABNORMAL HIGH (ref 6–23)
CO2: 25 mEq/L (ref 19–32)
CO2: 31 mEq/L (ref 19–32)
Chloride: 92 mEq/L — ABNORMAL LOW (ref 96–112)
Chloride: 89 mEq/L — ABNORMAL LOW (ref 96–112)
Creatinine, Ser: 4.05 mg/dL — ABNORMAL HIGH (ref 0.50–1.35)
GFR calc Af Amer: 11 mL/min — ABNORMAL LOW (ref >90)
GFR calc non Af Amer: 15 mL/min — ABNORMAL LOW (ref >90)
Glucose, Bld: 96 mg/dL (ref 70–99)
Potassium: 4 mEq/L (ref 3.5–5.1)
Sodium: 135 mEq/L (ref 135–145)

## 2012-02-18 LAB — POCT I-STAT 3, ART BLOOD GAS (G3+)
TCO2: 33 mmol/L (ref 0–100)
pCO2 arterial: 36.4 mmHg (ref 35.0–45.0)
pH, Arterial: 7.551 — ABNORMAL HIGH (ref 7.350–7.450)

## 2012-02-18 LAB — PROTEIN / CREATININE RATIO, URINE: Creatinine, Urine: 51.6 mg/dL

## 2012-02-18 LAB — URINE CULTURE
Colony Count: NO GROWTH
Culture  Setup Time: 201306081818

## 2012-02-18 LAB — POCT I-STAT 3, VENOUS BLOOD GAS (G3P V)
O2 Saturation: 71 %
Patient temperature: 97.9
TCO2: 28 mmol/L (ref 0–100)

## 2012-02-18 LAB — IRON AND TIBC: TIBC: 153 ug/dL — ABNORMAL LOW (ref 215–435)

## 2012-02-18 LAB — HEPATITIS B SURFACE ANTIGEN: Hepatitis B Surface Ag: NEGATIVE

## 2012-02-18 LAB — ABO/RH: ABO/RH(D): B POS

## 2012-02-18 LAB — PHOSPHORUS: Phosphorus: 6.6 mg/dL — ABNORMAL HIGH (ref 2.3–4.6)

## 2012-02-18 LAB — HEPATITIS B CORE ANTIBODY, IGM: Hep B C IgM: NEGATIVE

## 2012-02-18 MED ORDER — LAMIVUDINE 10 MG/ML PO SOLN
150.0000 mg | Freq: Every day | ORAL | Status: DC
  Administered 2012-02-18 – 2012-02-22 (×4): 150 mg via ORAL
  Filled 2012-02-18 (×5): qty 15

## 2012-02-18 MED ORDER — PANTOPRAZOLE SODIUM 40 MG IV SOLR
40.0000 mg | Freq: Every day | INTRAVENOUS | Status: DC
  Administered 2012-02-18 – 2012-02-23 (×6): 40 mg via INTRAVENOUS
  Filled 2012-02-18 (×7): qty 40

## 2012-02-18 MED ORDER — PRISMASOL BGK 4/2.5 32-4-2.5 MEQ/L IV SOLN
INTRAVENOUS | Status: DC
  Administered 2012-02-18 – 2012-02-20 (×15): via INTRAVENOUS_CENTRAL
  Filled 2012-02-18 (×24): qty 5000

## 2012-02-18 MED ORDER — DESMOPRESSIN ACETATE 4 MCG/ML IJ SOLN
25.0000 ug | Freq: Once | INTRAMUSCULAR | Status: DC
  Filled 2012-02-18: qty 6.3

## 2012-02-18 MED ORDER — PRISMASOL BGK 4/2.5 32-4-2.5 MEQ/L IV SOLN
INTRAVENOUS | Status: DC
  Administered 2012-02-19: 18:00:00 via INTRAVENOUS_CENTRAL
  Filled 2012-02-18 (×4): qty 5000

## 2012-02-18 MED ORDER — SODIUM CHLORIDE 0.9 % IV SOLN
25.0000 ug | Freq: Once | INTRAVENOUS | Status: AC
  Administered 2012-02-18: 25 ug via INTRAVENOUS
  Filled 2012-02-18: qty 6.3

## 2012-02-18 MED ORDER — ZIDOVUDINE 50 MG/5ML PO SYRP
300.0000 mg | ORAL_SOLUTION | Freq: Two times a day (BID) | ORAL | Status: DC
  Administered 2012-02-18 – 2012-02-22 (×7): 300 mg via ORAL
  Filled 2012-02-18 (×10): qty 30

## 2012-02-18 MED ORDER — LOPINAVIR-RITONAVIR 400-100 MG/5ML PO SOLN
5.0000 mL | Freq: Two times a day (BID) | ORAL | Status: DC
  Administered 2012-02-18 – 2012-03-01 (×24): 400 mg via ORAL
  Filled 2012-02-18 (×31): qty 5

## 2012-02-18 MED ORDER — CLINDAMYCIN PHOSPHATE 900 MG/50ML IV SOLN
900.0000 mg | Freq: Three times a day (TID) | INTRAVENOUS | Status: DC
  Administered 2012-02-18 – 2012-02-20 (×6): 900 mg via INTRAVENOUS
  Filled 2012-02-18 (×8): qty 50

## 2012-02-18 MED ORDER — WHITE PETROLATUM GEL
Status: AC
  Administered 2012-02-18: 17:00:00
  Filled 2012-02-18: qty 5

## 2012-02-18 MED ORDER — PRISMASOL BGK 4/2.5 32-4-2.5 MEQ/L IV SOLN
INTRAVENOUS | Status: DC
  Administered 2012-02-19: 12:00:00 via INTRAVENOUS_CENTRAL
  Filled 2012-02-18 (×3): qty 5000

## 2012-02-18 NOTE — Progress Notes (Signed)
eLink Physician-Brief Progress Note Patient Name: Bryan Gilmore DOB: November 23, 1950 MRN: [id number]  Date of Service  02/18/2012   HPI/Events of Note  Hb 6.6   eICU Interventions  Transfuse 1 Unit PRBC   Intervention Category Intermediate Interventions: Bleeding - evaluation and treatment with blood products  Trachelle Low V. 02/18/2012, 7:27 PM

## 2012-02-18 NOTE — Consult Note (Signed)
Infectious Diseases Initial Consultation  Reason for Consultation:  HIV management and Fournier's gangrene.   HPI: Bryan Gilmore is a 61 y.o. male with HIV poorly controlled and no ARVs for more than one year (previously on Truvada and Isentress, last CD4 440, VL 17400, wild type genotype 04/2011)) who presented with testicular pain and swelling.  He had denies any fever or chills.  Noted on CT to have Fournier's gangrene of penis, right hemiscrotum, perineum, thigh and retroperitoneum.  Went to OR by urology emergently yesterday with significant debridement.  Also noted was renal failure with creat of more than 8.  He was diagnosed recently with CKD stage IV and underwent vein mapping in April 2013 by Dr. Nicky Pugh office.  He currently is on CVVH and remains confused.  It is unclear if family members are aware of his HIV status.  He additionally has been coughing up blood and has had a nose bleed.    Past Medical History  Diagnosis Date  . Hypertension   . Renal disorder   . HIV (human immunodeficiency virus infection)     Allergies: No Known Allergies  Current antibiotics:   MEDICATIONS:    . lamiVUDine  150 mg Oral Daily  . lopinavir-ritonavir  5 mL Oral BID WC  . pantoprazole (PROTONIX) IV  40 mg Intravenous QHS  . paricalcitol  1 mcg Oral Daily  . piperacillin-tazobactam  3.375 g Intravenous Q6H  . sodium bicarbonate  100 mEq Intravenous Once  . vancomycin  1,000 mg Intravenous Once  . vancomycin  1,000 mg Intravenous Q24H  . Zidovudine  300 mg Oral Q12H  . DISCONTD: acyclovir  400 mg Oral TID  . DISCONTD: emtricitabine-tenofovir  1 tablet Oral Q M,W,F  . DISCONTD: piperacillin-tazobactam (ZOSYN)  IV  2.25 g Intravenous Q6H  . DISCONTD: raltegravir  400 mg Oral BID  . DISCONTD: vancomycin  1,500 mg Intravenous Q48H    History  Substance Use Topics  . Smoking status: Never Smoker   . Smokeless tobacco: Never Used  . Alcohol Use: Yes     1 beer 2-3 x per month    Family  History  Problem Relation Age of Onset  . Hypertension Mother   . Hypertension Father     Review of Systems - Negative except as per the HPI  OBJECTIVE: Temp:  [97.4 F (36.3 C)-98.7 F (37.1 C)] 97.6 F (36.4 C) (06/09 0810) Pulse Rate:  [25-131] 59  (06/09 0400) Resp:  [13-23] 17  (06/09 0900) BP: (104-165)/(55-117) 130/94 mmHg (06/09 0900) SpO2:  [84 %-100 %] 100 % (06/09 0400) FiO2 (%):  [40 %-50 %] 40 % (06/09 0800) Weight:  [238 lb 12.1 oz (108.3 kg)-240 lb 4.8 oz (109 kg)] 238 lb 15.7 oz (108.4 kg) (06/09 0500) General appearance: alert and mildly confused but seems to answer questions appropriately Neck: left IJ catheter in place with CVVH, no erythema Cardio: regular rate and rhythm, S1, S2 normal, no murmur, click, rub or gallop GI: soft, non-tender; bowel sounds normal; no masses,  no organomegaly Pelvic: wrapped but surrounding area without erythema or induration.   Extremities: edema mild  LABS: Results for orders placed during the hospital encounter of 02/17/12 (from the past 48 hour(s))  BASIC METABOLIC PANEL     Status: Abnormal   Collection Time   02/17/12  6:16 AM      Component Value Range Comment   Sodium 129 (*) 135 - 145 (mEq/L)    Potassium 5.8 (*) 3.5 -  5.1 (mEq/L)    Chloride 97  96 - 112 (mEq/L)    CO2 7 (*) 19 - 32 (mEq/L)    Glucose, Bld 93  70 - 99 (mg/dL)    BUN 161 (*) 6 - 23 (mg/dL)    Creatinine, Ser 0.96 (*) 0.50 - 1.35 (mg/dL)    Calcium 9.0  8.4 - 10.5 (mg/dL)    GFR calc non Af Amer 6 (*) >90 (mL/min)    GFR calc Af Amer 7 (*) >90 (mL/min)   CBC     Status: Abnormal   Collection Time   02/17/12  6:16 AM      Component Value Range Comment   WBC 27.8 (*) 4.0 - 10.5 (K/uL)    RBC 3.06 (*) 4.22 - 5.81 (MIL/uL)    Hemoglobin 9.1 (*) 13.0 - 17.0 (g/dL) REPEATED TO VERIFY   HCT 26.3 (*) 39.0 - 52.0 (%)    MCV 85.9  78.0 - 100.0 (fL)    MCH 29.7  26.0 - 34.0 (pg)    MCHC 34.6  30.0 - 36.0 (g/dL)    RDW 04.5  40.9 - 81.1 (%)    Platelets  212  150 - 400 (K/uL)   DIFFERENTIAL     Status: Abnormal   Collection Time   02/17/12  6:16 AM      Component Value Range Comment   Neutrophils Relative 92 (*) 43 - 77 (%)    Lymphocytes Relative 2 (*) 12 - 46 (%)    Monocytes Relative 6  3 - 12 (%)    Eosinophils Relative 0  0 - 5 (%)    Basophils Relative 0  0 - 1 (%)    Neutro Abs 25.5 (*) 1.7 - 7.7 (K/uL)    Lymphs Abs 0.6 (*) 0.7 - 4.0 (K/uL)    Monocytes Absolute 1.7 (*) 0.1 - 1.0 (K/uL)    Eosinophils Absolute 0.0  0.0 - 0.7 (K/uL)    Basophils Absolute 0.0  0.0 - 0.1 (K/uL)    RBC Morphology RARE NRBCs      WBC Morphology INCREASED BANDS (>20% BANDS)      Smear Review PLATELET CLUMPS NOTED ON SMEAR     PROCALCITONIN     Status: Normal   Collection Time   02/17/12  6:20 AM      Component Value Range Comment   Procalcitonin 10.04     LACTIC ACID, PLASMA     Status: Normal   Collection Time   02/17/12  6:51 AM      Component Value Range Comment   Lactic Acid, Venous 1.7  0.5 - 2.2 (mmol/L)   HEPATIC FUNCTION PANEL     Status: Abnormal   Collection Time   02/17/12  8:50 AM      Component Value Range Comment   Total Protein 7.5  6.0 - 8.3 (g/dL)    Albumin 2.3 (*) 3.5 - 5.2 (g/dL)    AST 22  0 - 37 (U/L)    ALT 12  0 - 53 (U/L)    Alkaline Phosphatase 105  39 - 117 (U/L)    Total Bilirubin 0.6  0.3 - 1.2 (mg/dL)    Bilirubin, Direct 0.5 (*) 0.0 - 0.3 (mg/dL)    Indirect Bilirubin 0.1 (*) 0.3 - 0.9 (mg/dL)   TYPE AND SCREEN     Status: Normal   Collection Time   02/17/12  8:50 AM      Component Value Range Comment   ABO/RH(D) B POS  Antibody Screen NEG      Sample Expiration 02/20/2012     CULTURE, BLOOD (ROUTINE X 2)     Status: Normal (Preliminary result)   Collection Time   02/17/12  8:50 AM      Component Value Range Comment   Specimen Description BLOOD RIGHT ARM      Special Requests        Value: BOTTLES DRAWN AEROBIC AND ANAEROBIC 2CC BOTH BOTTLES   Culture  Setup Time [id number]      Culture        Value:         BLOOD CULTURE RECEIVED NO GROWTH TO DATE CULTURE WILL BE HELD FOR 5 DAYS BEFORE ISSUING A FINAL NEGATIVE REPORT   Report Status PENDING     ABO/RH     Status: Normal   Collection Time   02/17/12  8:50 AM      Component Value Range Comment   ABO/RH(D) B POS     WOUND CULTURE     Status: Normal (Preliminary result)   Collection Time   02/17/12 12:00 PM      Component Value Range Comment   Specimen Description ABSCESS PERIRECTAL      Special Requests NONE      Gram Stain PENDING      Culture NO GROWTH 1 DAY      Report Status PENDING     BASIC METABOLIC PANEL     Status: Abnormal   Collection Time   02/17/12 12:30 PM      Component Value Range Comment   Sodium 128 (*) 135 - 145 (mEq/L)    Potassium 6.1 (*) 3.5 - 5.1 (mEq/L)    Chloride 97  96 - 112 (mEq/L)    CO2 9 (*) 19 - 32 (mEq/L)    Glucose, Bld 111 (*) 70 - 99 (mg/dL)    BUN 956 (*) 6 - 23 (mg/dL)    Creatinine, Ser 2.13 (*) 0.50 - 1.35 (mg/dL)    Calcium 8.5  8.4 - 10.5 (mg/dL)    GFR calc non Af Amer 6 (*) >90 (mL/min)    GFR calc Af Amer 7 (*) >90 (mL/min)   CBC     Status: Abnormal   Collection Time   02/17/12 12:30 PM      Component Value Range Comment   WBC 38.3 (*) 4.0 - 10.5 (K/uL)    RBC 2.94 (*) 4.22 - 5.81 (MIL/uL)    Hemoglobin 8.7 (*) 13.0 - 17.0 (g/dL)    HCT 08.6 (*) 57.8 - 52.0 (%)    MCV 85.7  78.0 - 100.0 (fL)    MCH 29.6  26.0 - 34.0 (pg)    MCHC 34.5  30.0 - 36.0 (g/dL)    RDW 46.9  62.9 - 52.8 (%)    Platelets 212  150 - 400 (K/uL)   BLOOD GAS, ARTERIAL     Status: Abnormal   Collection Time   02/17/12 12:42 PM      Component Value Range Comment   O2 Content 10.0      pH, Arterial 7.129 (*) 7.350 - 7.450     pCO2 arterial 24.9 (*) 35.0 - 45.0 (mmHg)    pO2, Arterial 195.0 (*) 80.0 - 100.0 (mmHg)    Bicarbonate 7.9 (*) 20.0 - 24.0 (mEq/L)    TCO2 8.0  0 - 100 (mmol/L)    Acid-base deficit 19.7 (*) 0.0 - 2.0 (mmol/L)    O2 Saturation 98.4  Patient temperature 98.6      Collection site  BRACHIAL ARTERY      Drawn by COLLECTED BY RT      Sample type ARTERIAL DRAW      Allens test (pass/fail) PASS  PASS    MRSA PCR SCREENING     Status: Normal   Collection Time   02/17/12  1:57 PM      Component Value Range Comment   MRSA by PCR NEGATIVE  NEGATIVE    URINALYSIS, ROUTINE W REFLEX MICROSCOPIC     Status: Abnormal   Collection Time   02/17/12  2:55 PM      Component Value Range Comment   Color, Urine YELLOW  YELLOW     APPearance CLOUDY (*) CLEAR     Specific Gravity, Urine 1.024  1.005 - 1.030     pH 6.0  5.0 - 8.0     Glucose, UA NEGATIVE  NEGATIVE (mg/dL)    Hgb urine dipstick LARGE (*) NEGATIVE     Bilirubin Urine NEGATIVE  NEGATIVE     Ketones, ur NEGATIVE  NEGATIVE (mg/dL)    Protein, ur >161 (*) NEGATIVE (mg/dL)    Urobilinogen, UA 1.0  0.0 - 1.0 (mg/dL)    Nitrite NEGATIVE  NEGATIVE     Leukocytes, UA MODERATE (*) NEGATIVE    URINE MICROSCOPIC-ADD ON     Status: Abnormal   Collection Time   02/17/12  2:55 PM      Component Value Range Comment   WBC, UA 21-50  <3 (WBC/hpf) WITH CLUMPS   RBC / HPF TOO NUMEROUS TO COUNT  <3 (RBC/hpf)    Bacteria, UA MANY (*) RARE    GLUCOSE, CAPILLARY     Status: Normal   Collection Time   02/17/12  4:29 PM      Component Value Range Comment   Glucose-Capillary 98  70 - 99 (mg/dL)   POCT I-STAT 3, BLOOD GAS (G3+)     Status: Abnormal   Collection Time   02/17/12  5:57 PM      Component Value Range Comment   pH, Arterial 7.298 (*) 7.350 - 7.450     pCO2 arterial 26.1 (*) 35.0 - 45.0 (mmHg)    pO2, Arterial 216.0 (*) 80.0 - 100.0 (mmHg)    Bicarbonate 12.7 (*) 20.0 - 24.0 (mEq/L)    TCO2 14  0 - 100 (mmol/L)    O2 Saturation 100.0      Acid-base deficit 12.0 (*) 0.0 - 2.0 (mmol/L)    Patient temperature 37.4 C      Collection site BRACHIAL ARTERY      Drawn by RT      Sample type ARTERIAL     CBC     Status: Abnormal   Collection Time   02/17/12  6:30 PM      Component Value Range Comment   WBC 28.0 (*) 4.0 - 10.5 (K/uL)  WHITE COUNT CONFIRMED ON SMEAR   RBC 2.08 (*) 4.22 - 5.81 (MIL/uL)    Hemoglobin 6.2 (*) 13.0 - 17.0 (g/dL)    HCT 09.6 (*) 04.5 - 52.0 (%)    MCV 83.2  78.0 - 100.0 (fL)    MCH 29.8  26.0 - 34.0 (pg)    MCHC 35.8  30.0 - 36.0 (g/dL)    RDW 40.9  81.1 - 91.4 (%)    Platelets 185  150 - 400 (K/uL) PLATELET COUNT CONFIRMED BY SMEAR  DIFFERENTIAL     Status: Abnormal  Collection Time   02/17/12  6:30 PM      Component Value Range Comment   Neutrophils Relative 92 (*) 43 - 77 (%)    Lymphocytes Relative 4 (*) 12 - 46 (%)    Monocytes Relative 4  3 - 12 (%)    Eosinophils Relative 0  0 - 5 (%)    Basophils Relative 0  0 - 1 (%)    Neutro Abs 25.8 (*) 1.7 - 7.7 (K/uL)    Lymphs Abs 1.1  0.7 - 4.0 (K/uL)    Monocytes Absolute 1.1 (*) 0.1 - 1.0 (K/uL)    Eosinophils Absolute 0.0  0.0 - 0.7 (K/uL)    Basophils Absolute 0.0  0.0 - 0.1 (K/uL)    RBC Morphology POLYCHROMASIA PRESENT      WBC Morphology TOXIC GRANULATION   INCREASED BANDS (>20% BANDS)  COMPREHENSIVE METABOLIC PANEL     Status: Abnormal   Collection Time   02/17/12  6:30 PM      Component Value Range Comment   Sodium 132 (*) 135 - 145 (mEq/L)    Potassium 5.3 (*) 3.5 - 5.1 (mEq/L)    Chloride 99  96 - 112 (mEq/L)    CO2 12 (*) 19 - 32 (mEq/L)    Glucose, Bld 101 (*) 70 - 99 (mg/dL)    BUN 409 (*) 6 - 23 (mg/dL)    Creatinine, Ser 8.11 (*) 0.50 - 1.35 (mg/dL)    Calcium 8.0 (*) 8.4 - 10.5 (mg/dL)    Total Protein 6.3  6.0 - 8.3 (g/dL)    Albumin 1.8 (*) 3.5 - 5.2 (g/dL)    AST 23  0 - 37 (U/L)    ALT 10  0 - 53 (U/L)    Alkaline Phosphatase 197 (*) 39 - 117 (U/L)    Total Bilirubin 0.8  0.3 - 1.2 (mg/dL)    GFR calc non Af Amer 6 (*) >90 (mL/min)    GFR calc Af Amer 7 (*) >90 (mL/min)   APTT     Status: Normal   Collection Time   02/17/12  6:30 PM      Component Value Range Comment   aPTT 33  24 - 37 (seconds)   PROTIME-INR     Status: Abnormal   Collection Time   02/17/12  6:30 PM      Component Value Range Comment    Prothrombin Time 18.7 (*) 11.6 - 15.2 (seconds)    INR 1.53 (*) 0.00 - 1.49    LACTIC ACID, PLASMA     Status: Normal   Collection Time   02/17/12  6:30 PM      Component Value Range Comment   Lactic Acid, Venous 1.1  0.5 - 2.2 (mmol/L)   CORTISOL     Status: Normal   Collection Time   02/17/12  6:30 PM      Component Value Range Comment   Cortisol, Plasma 32.2     PROCALCITONIN     Status: Normal   Collection Time   02/17/12  6:30 PM      Component Value Range Comment   Procalcitonin 13.97     PRO B NATRIURETIC PEPTIDE     Status: Abnormal   Collection Time   02/17/12  6:30 PM      Component Value Range Comment   Pro B Natriuretic peptide (BNP) 42513.0 (*) 0 - 125 (pg/mL)   MAGNESIUM     Status: Normal   Collection Time   02/17/12  6:30 PM      Component Value Range Comment   Magnesium 2.2  1.5 - 2.5 (mg/dL)   PHOSPHORUS     Status: Abnormal   Collection Time   02/17/12  6:30 PM      Component Value Range Comment   Phosphorus 9.5 (*) 2.3 - 4.6 (mg/dL)   TYPE AND SCREEN     Status: Normal (Preliminary result)   Collection Time   02/17/12  8:00 PM      Component Value Range Comment   ABO/RH(D) B POS      Antibody Screen NEG      Sample Expiration 02/20/2012      Unit Number [id number]      Blood Component Type RED CELLS,LR      Unit division 00      Status of Unit ISSUED      Transfusion Status OK TO TRANSFUSE      Crossmatch Result Compatible     ABO/RH     Status: Normal (Preliminary result)   Collection Time   02/17/12  8:00 PM      Component Value Range Comment   ABO/RH(D) B POS     PREPARE RBC (CROSSMATCH)     Status: Normal   Collection Time   02/17/12  8:30 PM      Component Value Range Comment   Order Confirmation ORDER PROCESSED BY BLOOD BANK     RENAL FUNCTION PANEL     Status: Abnormal   Collection Time   02/18/12  5:00 AM      Component Value Range Comment   Sodium 135  135 - 145 (mEq/L)    Potassium 4.0  3.5 - 5.1 (mEq/L)    Chloride 92 (*) 96 - 112 (mEq/L)    CO2  25  19 - 32 (mEq/L)    Glucose, Bld 96  70 - 99 (mg/dL)    BUN 98 (*) 6 - 23 (mg/dL)    Creatinine, Ser 4.09 (*) 0.50 - 1.35 (mg/dL)    Calcium 7.8 (*) 8.4 - 10.5 (mg/dL)    Phosphorus 6.6 (*) 2.3 - 4.6 (mg/dL)    Albumin 1.8 (*) 3.5 - 5.2 (g/dL)    GFR calc non Af Amer 9 (*) >90 (mL/min)    GFR calc Af Amer 11 (*) >90 (mL/min)   MAGNESIUM     Status: Normal   Collection Time   02/18/12  5:00 AM      Component Value Range Comment   Magnesium 2.1  1.5 - 2.5 (mg/dL)   APTT     Status: Normal   Collection Time   02/18/12  5:00 AM      Component Value Range Comment   aPTT 32  24 - 37 (seconds)   IRON AND TIBC     Status: Abnormal   Collection Time   02/18/12  5:00 AM      Component Value Range Comment   Iron 27 (*) 42 - 135 (ug/dL)    TIBC 811 (*) 914 - 435 (ug/dL)    Saturation Ratios 18 (*) 20 - 55 (%)    UIBC 126  125 - 400 (ug/dL)   FERRITIN     Status: Abnormal   Collection Time   02/18/12  5:00 AM      Component Value Range Comment   Ferritin 840 (*) 22 - 322 (ng/mL)   HEPATITIS B SURFACE ANTIGEN     Status: Normal   Collection Time   02/18/12  5:00  AM      Component Value Range Comment   Hepatitis B Surface Ag NEGATIVE  NEGATIVE    HEPATITIS B CORE ANTIBODY, IGM     Status: Normal   Collection Time   02/18/12  5:00 AM      Component Value Range Comment   Hep B C IgM NEGATIVE  NEGATIVE    HEPATITIS B SURFACE ANTIBODY     Status: Normal   Collection Time   02/18/12  5:00 AM      Component Value Range Comment   Hep B S Ab NEGATIVE  NEGATIVE    PHOSPHORUS     Status: Abnormal   Collection Time   02/18/12  5:00 AM      Component Value Range Comment   Phosphorus 6.6 (*) 2.3 - 4.6 (mg/dL)   TSH     Status: Normal   Collection Time   02/18/12  5:00 AM      Component Value Range Comment   TSH 2.622  0.350 - 4.500 (uIU/mL)   CBC     Status: Abnormal   Collection Time   02/18/12  5:00 AM      Component Value Range Comment   WBC 21.7 (*) 4.0 - 10.5 (K/uL)    RBC 2.49 (*) 4.22 - 5.81  (MIL/uL)    Hemoglobin 7.2 (*) 13.0 - 17.0 (g/dL)    HCT 78.2 (*) 95.6 - 52.0 (%)    MCV 80.3  78.0 - 100.0 (fL)    MCH 28.9  26.0 - 34.0 (pg)    MCHC 36.0  30.0 - 36.0 (g/dL)    RDW 21.3  08.6 - 57.8 (%)    Platelets 184  150 - 400 (K/uL)   PROTIME-INR     Status: Abnormal   Collection Time   02/18/12  5:00 AM      Component Value Range Comment   Prothrombin Time 17.4 (*) 11.6 - 15.2 (seconds)    INR 1.40  0.00 - 1.49    SODIUM, URINE, RANDOM     Status: Normal   Collection Time   02/18/12  5:10 AM      Component Value Range Comment   Sodium, Ur 22     POCT I-STAT 3, BLOOD GAS (G3P V)     Status: Abnormal   Collection Time   02/18/12  5:25 AM      Component Value Range Comment   pH, Ven 7.470 (*) 7.250 - 7.300     pCO2, Ven 36.9 (*) 45.0 - 50.0 (mmHg)    pO2, Ven 34.0  30.0 - 45.0 (mmHg)    Bicarbonate 27.0 (*) 20.0 - 24.0 (mEq/L)    TCO2 28  0 - 100 (mmol/L)    O2 Saturation 71.0      Acid-Base Excess 3.0 (*) 0.0 - 2.0 (mmol/L)    Patient temperature 97.9 F      Collection site HEP LOCK      Drawn by Nurse      Sample type VENOUS      Comment VALUES EXPECTED, NO REPEAT       MICRO:  IMAGING: Ct Abdomen Wo Contrast  02/17/2012  *RADIOLOGY REPORT*  Clinical Data:  Testicular and flank pain, urinary retention, immunosuppressed.  CT ABDOMEN AND PELVIS WITHOUT CONTRAST  Technique:  Multidetector CT imaging of the abdomen and pelvis was performed following the standard protocol without intravenous contrast.  Comparison:  07/13/2010 ultrasound  Findings:  Cardiomegaly.  Coronary artery calcification.  Low attenuation of  the blood pool suggests anemia.  Lung bases are clear.  Degraded by patient motion.  Organ abnormality/lesion detection is limited in the absence of intravenous contrast. Within this limitation, unremarkable liver, spleen, pancreas, adrenal glands. Distended gallbladder.  No pericholecystic fluid or radiodense gallstones. No biliary ductal dilatation.  Atrophic kidneys  with perinephric fat stranding.  Focal lesion detection is significantly limited given the motion.  There is suggestion of a 1.9 cm interpolar left renal lesion with thin peripheral calcification (image 26 of series 2).  No hydronephrosis or hydroureter.  No bowel obstruction.  Normal appendix.  Circumferential bladder wall thickening, nonspecific given incomplete distension.  Vas deferens calcifications suggest diabetes.  There is scattered atherosclerotic calcification of the aorta and its branches. No aneurysmal dilatation.  There is peri-anal gas and a more focal pocket measuring 3.2 x 1.3 cm on series 2 image 80. Air tracks superiorly along the left iliac vessels and left para-aortic retroperitoneum.  Inferiorly, extends throughout the perineum, the medial right thigh, the right hemiscrotum and penis, partially imaged.  IMPRESSION: Findings are concerning for Fournier gangrene, with gas noted to track along soft tissues of the penis, right hemiscrotum, perineum, right thigh, and left retroperitoneum.  There is a perianal gas collection which may reflect a perianal abscess as the potential source of infection. Emergent urologic consultation recommended.  Critical Value/emergent results were called by telephone at the time of interpretation on February 17, 2012  at 06:15 a.m.  to  Dr. Read Drivers, who verbally acknowledged these results.  1.9 cm interpolar left renal lesion with thin peripheral calcification. Recommend ultrasound follow-up.  Original Report Authenticated By: Waneta Martins, M.D.   Dg Chest Port 1 View  02/18/2012  *RADIOLOGY REPORT*  Clinical Data: Respiratory failure  PORTABLE CHEST - 1 VIEW  Comparison: 02/17/2012  Findings: Left IJ catheter terminates at the junction of the left brachiocephalic vein and superior vena cava, directed laterally. Cardiomegaly is stable.  Lung volumes are low.  There is mild peribronchial thickening at the lung bases.  Otherwise, the lungs appear clear.  No visible  pleural effusion or pneumothorax.  IMPRESSION: Mild peribronchial thickening at the lung bases.  Otherwise, the lungs appear clear.  Original Report Authenticated By: Britta Mccreedy, M.D.   Dg Chest Port 1 View  02/17/2012  *RADIOLOGY REPORT*  Clinical Data: Line placement  PORTABLE CHEST - 1 VIEW  Comparison: 02/17/2012 at 1536 hours  Findings: Left IJ dual lumen catheter with its tip at the junction of the left brachiocephalic vein and SVC.  Lungs are essentially clear. No pleural effusion or pneumothorax.  Mild cardiomegaly.  IMPRESSION: Left IJ dual lumen catheter with its tip at the junction of the left brachiocephalic vein and SVC.  No pneumothorax.  Original Report Authenticated By: Charline Bills, M.D.   Dg Chest Port 1 View  02/17/2012  *RADIOLOGY REPORT*  Clinical Data: Pulmonary edema  PORTABLE CHEST - 1 VIEW  Comparison: None.  Findings: Cardiomediastinal silhouette is unremarkable.  No acute infiltrate or pleural effusion.  No pulmonary edema.  Bony thorax is unremarkable.  IMPRESSION: No active disease.  Original Report Authenticated By: Natasha Mead, M.D.   Dg C-arm 1-60 Min  02/17/2012  *RADIOLOGY REPORT*  Clinical Data:  Fournier's gangrene  RETROGRADE URETHROGRAM  Comparison:  None.  Fluoroscopy Time:  45 seconds  This exam was performed by Dr. Sherron Monday.  Findings:  Two spot fluoroscopic views of the lower pelvis were performed.  On image #1, there is a small-bore catheter projecting over  the penis, with contrast in the urinary bladder.  Bladder is normal in shape. Contrast is seen with an irregular shape, projecting over the expected location of the penis.  It is uncertain if this is some contrast extravasation within the soft tissues of the penis, distal urethral injury, or contrast contamination outside of the patient.  The second image shows a larger bore Foley catheter satisfactorily positioned, with the balloon inflated in the lower urinary bladder and contrast within the urinary  bladder.  The previously described contrast extravasation over the penile region is no longer present. Question if it could be in a contamination adjacent to the patient. Numerous irregular locules of gas project over the perineum, consistent with the Fournier's gangrene seen by CT.  IMPRESSION: Two intraoperative images are submitted as described above.  Please also see Dr. Mina Marble report.  Original Report Authenticated By: Britta Mccreedy, M.D.    HISTORICAL MICRO/IMAGING  Assessment/Plan:  61 yo with Fournier's gangrene and HIV.  1) HIV - has not been on ARVs since he has not been seen in about 1 year in ID clinic.  I will start lamivudine, zidovudine and Kaletra solutions since he is having difficulty with swallowing and repiratory status precludes NGT, dosed for CVVH.  Checking CD4, viral load, HLA [id number].    2) FG - on broad spectrum antibiotics.  Will add clindamycin for toxin effect.    3) Renal failure - he had not been on ARVs for more than 1 year.

## 2012-02-18 NOTE — Anesthesia Postprocedure Evaluation (Signed)
Anesthesia Post Note  Patient: Bryan Gilmore  Procedure(s) Performed: Procedure(s) (LRB): CYSTOSCOPY WITH URETHRAL DILATATION (N/A) INCISION AND DRAINAGE ABSCESS (N/A)  Anesthesia type: General  Patient location: PACU  Post pain: Pain level controlled  Post assessment: Post-op Vital signs reviewed  Last Vitals:  Filed Vitals:   02/18/12 1600  BP:   Pulse:   Temp: 36.6 C  Resp:     Post vital signs: Reviewed  Level of consciousness: sedated  Complications: No apparent anesthesia complications

## 2012-02-18 NOTE — Progress Notes (Signed)
Reviewed all notes Will change dressing tomorrow

## 2012-02-18 NOTE — Progress Notes (Signed)
Name: Bryan Gilmore MRN: [id number] DOB: May 06, 1951    LOS: 1  Referring Provider:  Urology Reason for Referral:  Respiratory failure and acidosis.  PULMONARY / CRITICAL CARE MEDICINE  HPI:  61 year old male with HIV PMH presented to [facility] with 1 wk history of testicular discomfort and swelling, dysuria and difficulty urinating. In ER was noted to have Fournier gangrene and perineal abscesses and was taken to the OR for surgical correction 6/8.  Patient was also noted to have evolving renal failure and profound acidosis.  Renal was consulted and recommended CVVH and pt was tx to [facility] under PCCM.   Subjective/Overnight: Developed hemoptysis overnight.  Intermittently coughing up large red clots.  Drop hgb overnight requiring 1unit PRBC. Denies SOB, pain.   Vital Signs: Temp:  [97.4 F (36.3 C)-98.7 F (37.1 C)] 97.9 F (36.6 C) (06/09 0400) Pulse Rate:  [25-131] 59  (06/09 0400) Resp:  [13-23] 15  (06/09 0700) BP: (104-165)/(55-117) 114/76 mmHg (06/09 0700) SpO2:  [84 %-100 %] 100 % (06/09 0400) FiO2 (%):  [40 %-50 %] 40 % (06/09 0400) Weight:  [238 lb 12.1 oz (108.3 kg)-240 lb 4.8 oz (109 kg)] 238 lb 15.7 oz (108.4 kg) (06/09 0500)  Physical Examination: General: chronically ill appearing male, NAD  Neuro: awake, alert, confused at times   CV: s1s2 rrr PULM: resps even non labored, diminished bases, few scattered ronchi GI: abd soft, +bs Extremities: warm and dry, no edema    Principal Problem:  *Sepsis secondary to suspected Fornier's gangrene Active Problems:  HIV INFECTION  ARF (acute renal failure) in the setting of stage III CKD  Metabolic acidosis  Hyperkalemia  Normocytic anemia  Hyponatremia  Acute respiratory failure   ASSESSMENT AND PLAN  PULMONARY  Lab 02/18/12 0525 02/17/12 1757 02/17/12 1242  PHART -- 7.298* 7.129*  PCO2ART -- 26.1* 24.9*  PO2ART -- 216.0* 195.0*  HCO3 27.0* 12.7* 7.9*  O2SAT 71.0 100.0 98.4   Ventilator Settings:  CXR:  6/8>> Clear   ETT:  none  A:  Acute respiratory distress - respiratory compensation in setting metabolic acidosis Hemoptysis P:   Tenuous resp status, appears mildly improved for now. Closely monitor. Correct metabolic issues - see below. ? Etiology of hemoptysis. CXR essentially clear.  ?injury during intubation for OR v DIC.  Consider FOB if persists  See heme  CARDIOVASCULAR  Lab 02/17/12 1830 02/17/12 0651  TROPONINI -- --  LATICACIDVEN 1.1 1.7  PROBNP 42513.0* --   ECG:  NSR Lines:  L IJ HD cath 6/8>>>  A: SIRS - likely r/t suspected Fournier's gangrene with peroneal and perirectal abscesses.  BP ok.  Lactate not impressive.  P:  Trend lactate, pct  abx - see below   RENAL  Lab 02/18/12 0500 02/17/12 1830 02/17/12 1230 02/17/12 0616  NA 135 132* 128* 129*  K 4.0 5.3* -- --  CL 92* 99 97 97  CO2 25 12* 9* 7*  BUN 98* 121* 116* 114*  CREATININE 5.95* 8.51* 8.62* 8.63*  CALCIUM 7.8* 8.0* 8.5 9.0  MG 2.1 2.2 -- --  PHOS 6.6*6.6* 9.5* -- --   Intake/Output      06/08 0701 - 06/09 0700 06/09 0701 - 06/10 0700   I.V. (mL/kg) 1550 (14.3)    Blood 162    IV Piggyback 350    Total Intake(mL/kg) 2062 (19)    Urine (mL/kg/hr) 410 (0.2)    Other 563    Blood 100    Total Output 1073  Net +989          Foley:  6/8  A:  Acute on chronic renal failure - ?progression of underlying disease (prob stage IV CKD with baseline SCr 2.47-2.78) c/b ATN in setting SIRS, volume depletion, ?drug toxicity.  Metabolic acidosis  P:   Dialysis per renal with HCO3  F/u chem   GASTROINTESTINAL  Lab 02/18/12 0500 02/17/12 1830 02/17/12 0850  AST -- 23 22  ALT -- 10 12  ALKPHOS -- 197* 105  BILITOT -- 0.8 0.6  PROT -- 6.3 7.5  ALBUMIN 1.8* 1.8* 2.3*    A:  No acute issues  P:   NPO for now   HEMATOLOGIC  Lab 02/18/12 0500 02/17/12 1830 02/17/12 1230 02/17/12 0616  HGB 7.2* 6.2* 8.7* 9.1*  HCT 20.0* 17.3* 25.2* 26.3*  PLT 184 185 212 212  INR 1.40 1.53* -- --  APTT 32 33 --  --   A:  Anemia - ?r/t hemoptysis.  Coags wnl.  P:  Required PRBC overnight Monitor CBC q12 Transfuse for hgb <7   INFECTIOUS  Lab 02/18/12 0500 02/17/12 1830 02/17/12 1230 02/17/12 0620 02/17/12 0616  WBC 21.7* 28.0* 38.3* -- 27.8*  PROCALCITON -- 13.97 -- 10.04 --   Cultures: BCx2 6/8>>> Urine 6/8>>> Wound (perirectal abscess) 6/8>>>  Antibiotics: Zosyn 6/8>>> Vancomycin 6/8>>>  A:  Fournier's gangrene - s/p I/D 6/8.      HIV - last CD4 =440 04/17/11 P:   Repeat CD4 pending  Cont HIV rx  Broad spectrum abx for abscesses  ID to see   ENDOCRINE  A:   No acute issues  P:   Monitor blood sugars on bmet   NEUROLOGIC  A:  AMS - in setting SIRS and acute on chronic renal failure.  Somewhat improved.  P:   Supportive care  Correct metabolic issues as above   BEST PRACTICE / DISPOSITION Level of Care:  ICU Primary Service:  PCCM Consultants:  Renal, ID, urology  Code Status:  full Diet:  NPO DVT Px:  SCD's GI Px:  protonix Skin Integrity:  Peroneal wounds/ surgical incisions  Social / Family:  No family available    Danford Bad, NP 02/18/2012  8:16 AM Pager: [phone]  *Care during the described time interval was provided by me and/or other providers on the critical care team. I have reviewed this patient's available data, including medical history, events of note, physical examination and test results as part of my evaluation.  Hemoptysis overnight, appears old blood likely with uremic dysfunction of platelet.  With dialysis would anticipate that patient will stabilize as he is dialyzed.  Bronchoscopy right now would not be a very safe procedure.  It is likely bronchitis with uremic platelet dysfunction.  Will re-evaluate after dialysis and will likely need bronchoscopy after more stable.  Hold in ICU for observation.  Patient seen and examined, agree with above note.  I dictated the care and orders written for this patient under my  direction.  Koren Bound, M.D. [phone]

## 2012-02-18 NOTE — Progress Notes (Signed)
Subjective:   Alert, on CRRT, no complaints, K+ and pH are better.    Objective Vital signs in last 24 hours: Filed Vitals:   02/18/12 0700 02/18/12 0800 02/18/12 0810 02/18/12 0900  BP: 114/76 128/67  130/94  Pulse:      Temp:   97.6 F (36.4 C)   TempSrc:      Resp: 15 16  17   Height:      Weight:      SpO2:       Weight change:   Intake/Output Summary (Last 24 hours) at 02/18/12 1133 Last data filed at 02/18/12 1100  Gross per 24 hour  Intake   2262 ml  Output   1051 ml  Net   1211 ml   Labs: Basic Metabolic Panel:  Lab 02/18/12 1610 02/17/12 1830 02/17/12 1230 02/17/12 0616  NA 135 132* 128* 129*  K 4.0 5.3* 6.1* 5.8*  CL 92* 99 97 97  CO2 25 12* 9* 7*  GLUCOSE 96 101* 111* 93  BUN 98* 121* 116* 114*  CREATININE 5.95* 8.51* 8.62* 8.63*  ALB -- -- -- --  CALCIUM 7.8* 8.0* 8.5 9.0  PHOS 6.6*6.6* 9.5* -- --   Liver Function Tests:  Lab 02/18/12 0500 02/17/12 1830 02/17/12 0850  AST -- 23 22  ALT -- 10 12  ALKPHOS -- 197* 105  BILITOT -- 0.8 0.6  PROT -- 6.3 7.5  ALBUMIN 1.8* 1.8* 2.3*   No results found for this basename: LIPASE:3,AMYLASE:3 in the last 168 hours No results found for this basename: AMMONIA:3 in the last 168 hours CBC:  Lab 02/18/12 0500 02/17/12 1830 02/17/12 1230 02/17/12 0616  WBC 21.7* 28.0* 38.3* 27.8*  NEUTROABS -- 25.8* -- 25.5*  HGB 7.2* 6.2* 8.7* 9.1*  HCT 20.0* 17.3* 25.2* 26.3*  MCV 80.3 83.2 85.7 85.9  PLT 184 185 212 212   PT/INR: @labrcntip (inr:5) Cardiac Enzymes: No results found for this basename: CKTOTAL:5,CKMB:5,CKMBINDEX:5,TROPONINI:5 in the last 168 hours CBG:  Lab 02/17/12 1629  GLUCAP 98    Iron Studies:  Lab 02/18/12 0500  IRON 27*  TIBC 153*  TRANSFERRIN --  FERRITIN 840*    Physical Exam:  Blood pressure 130/94, pulse 59, temperature 97.6 F (36.4 C), temperature source Oral, resp. rate 17, height 5\' 11"  (1.803 m), weight 108.4 kg (238 lb 15.7 oz), SpO2 100.00%.  Gen: on facemask 02,  somnolent, arouseable  HEENT: PERRL, EOMI  Skin: no rash, cyanosis  Neck: full neck veins , no bruits or LAN  Chest: coarse BS bilat  Heart: regular, no rub or gallop  Abdomen: soft, obese nontender mid abdomen, liver down 4 cm, + BS\  Neuro: alert, Ox3, no focal deficit  Heme/Lymph: no bruising or LAN   Date  Creat  09/20/2010 2.47  11/04/2010 2.78  04/17/2011 4.51  02/15/2012 8.63  02/17/2012 8.62   UA- >300 prot, 21-50WBC, tntc RBC, many bact  CT abd- CM, cor art Ca++, distended GB, atrophic kidneys, no hydro, gas in soft tissues along penis, perineum, R thigh, R scrotum  CXR 6/8- normal, NAD   Impression/Plan  1. Acute on chronic renal failure IV- baseline creat not clear but at best 2.5-2.8, at worst 4.5 last August. Says he sees a Dr. Mayer Masker, that's the only doctor he sees. Will check with our office in am to see if he's been there. For now would continue CRRT another day, then prob stop and do IHD as needed if he's not showing signs of recovery.  Kidneys are atrophic on CT scan- renal US ordered to assess size.  Would not be surprised if this is end-stage renal failure. 2. Anemia- bad epistaxis episode, will give DDAVP x 1. Transfusion per CCM as needed.    3. Metabolic acidosis- severe, improved with CRRT 4. Fournier's gangrene- s/p I&D, on IV abx 5. Volume - No gross excess, +JVD with clear CXR, no LE edema. Keeping even with CRRT.  6. 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  +proteinuria, ordered ANA, SPEP and UPEP, hep B,  save L arm and do vein mapping 7. Hep C- per primary 8. HIV - per primary 9. Hx of HTN - not on BP meds currently at home 10. Hyperkalemia - better 11. AMS - post-op and uremia, improving.   Vinson Moselle  MD [facility] [phone] pgr    [phone] cell 02/18/2012, 11:33 AM

## 2012-02-18 NOTE — Progress Notes (Signed)
ANTIBIOTIC CONSULT NOTE - INITIAL  Pharmacy Consult for Vancomycin and Zosyn  Indication:  Fourniers gangrene, perirectal abscesses     Vital Signs: Temp: 97.6 F (36.4 C) (06/09 0810) Temp src: Oral (06/09 0400) BP: 130/94 mmHg (06/09 0900) Pulse Rate: 59  (06/09 0400)  Labs:  Basename 02/18/12 0500 02/17/12 1830 02/17/12 1230  WBC 21.7* 28.0* 38.3*  HGB 7.2* 6.2* 8.7*  PLT 184 185 212  LABCREA -- -- --  CREATININE 5.95* 8.51* 8.62*     Microbiology: Recent Results (from the past 720 hour(s))  CULTURE, BLOOD (ROUTINE X 2)     Status: Normal (Preliminary result)   Collection Time   02/17/12  8:50 AM      Component Value Range Status Comment   Specimen Description BLOOD RIGHT ARM   Final    Special Requests     Final    Value: BOTTLES DRAWN AEROBIC AND ANAEROBIC 2CC BOTH BOTTLES   Culture  Setup Time [id number]   Final    Culture     Final    Value:        BLOOD CULTURE RECEIVED NO GROWTH TO DATE CULTURE WILL BE HELD FOR 5 DAYS BEFORE ISSUING A FINAL NEGATIVE REPORT   Report Status PENDING   Incomplete   WOUND CULTURE     Status: Normal (Preliminary result)   Collection Time   02/17/12 12:00 PM      Component Value Range Status Comment   Specimen Description ABSCESS PERIRECTAL   Final    Special Requests NONE   Final    Gram Stain PENDING   Incomplete    Culture NO GROWTH 1 DAY   Final    Report Status PENDING   Incomplete   MRSA PCR SCREENING     Status: Normal   Collection Time   02/17/12  1:57 PM      Component Value Range Status Comment   MRSA by PCR NEGATIVE  NEGATIVE  Final      Assessment: Transferred to [facility] for CVVHD.  On Vancomycin and Zosyn for fourniers gangrene - cultures pending.  Goal of Therapy:  Vancomycin trough level 15-20 mcg/ml Zosyn based on renal function   Plan:  Zosyn 3.375g IV q6h Vancomycin 1g IV q24 (antibiotics dosed for CVVHD) Will monitor renal function and cultures with you.  Plan to check vancomycin trough in 2 or 3 days to  confirm dosage.  Celedonio Miyamoto, PharmD, BCPS Clinical Pharmacist Pager [phone]

## 2012-02-19 ENCOUNTER — Inpatient Hospital Stay (HOSPITAL_COMMUNITY): Payer: BC Managed Care – PPO

## 2012-02-19 ENCOUNTER — Encounter (HOSPITAL_COMMUNITY): Payer: Self-pay | Admitting: Urology

## 2012-02-19 DIAGNOSIS — N186 End stage renal disease: Secondary | ICD-10-CM

## 2012-02-19 LAB — RENAL FUNCTION PANEL
Albumin: 1.8 g/dL — ABNORMAL LOW (ref 3.5–5.2)
CO2: 30 mEq/L (ref 19–32)
Calcium: 8.4 mg/dL (ref 8.4–10.5)
Chloride: 99 mEq/L (ref 96–112)
Creatinine, Ser: 3.02 mg/dL — ABNORMAL HIGH (ref 0.50–1.35)
Creatinine, Ser: 2.42 mg/dL — ABNORMAL HIGH (ref 0.50–1.35)
GFR calc Af Amer: 24 mL/min — ABNORMAL LOW (ref >90)
GFR calc Af Amer: 32 mL/min — ABNORMAL LOW (ref >90)
GFR calc non Af Amer: 27 mL/min — ABNORMAL LOW (ref >90)
Glucose, Bld: 98 mg/dL (ref 70–99)
Potassium: 4 mEq/L (ref 3.5–5.1)
Sodium: 136 mEq/L (ref 135–145)

## 2012-02-19 LAB — CBC
Platelets: 151 10*3/uL (ref 150–400)
Platelets: 150 10*3/uL (ref 150–400)
RDW: 13.5 % (ref 11.5–15.5)
RDW: 13.9 % (ref 11.5–15.5)
WBC: 16.2 10*3/uL — ABNORMAL HIGH (ref 4.0–10.5)
WBC: 10.8 10*3/uL — ABNORMAL HIGH (ref 4.0–10.5)

## 2012-02-19 LAB — APTT: aPTT: 33 seconds (ref 24–37)

## 2012-02-19 LAB — HIV-1 RNA ULTRAQUANT REFLEX TO GENTYP+
HIV 1 RNA Quant: 198302 copies/mL — ABNORMAL HIGH (ref <20)
HIV-1 RNA Quant, Log: 5.3 {Log} — ABNORMAL HIGH (ref <1.30)

## 2012-02-19 LAB — MAGNESIUM: Magnesium: 2.4 mg/dL (ref 1.5–2.5)

## 2012-02-19 LAB — PARATHYROID HORMONE, INTACT (NO CA): PTH: 938.5 pg/mL — ABNORMAL HIGH (ref 14.0–72.0)

## 2012-02-19 LAB — T-HELPER CELLS (CD4) COUNT (NOT AT ARMC): CD4 % Helper T Cell: 18 % — ABNORMAL LOW (ref 33–55)

## 2012-02-19 NOTE — Consult Note (Signed)
[facility] CONSULT NOTE 02/19/2012 DOB: [id number] MRN : [id number]  CC: CKD need for permanent dialysis fistula.  He is right hand dominant.  Referring Physician:Dr. Lowell Guitar  History of Present Illness: This is a new problem. The current episode started in the past 7 days. The problem occurs constantly. The problem has been unchanged. The pain is medium. Associated symptoms include dysuria. Pertinent negatives include no abdominal pain, constipation, fever, nausea or vomiting. He has tried nothing for the symptoms. The treatment provided no relief. His past medical history is significant for HIV.   Bryan Gilmore is a 61 y.o. male who presents to the Emergency Department complaining of groin pain that started about one week ago. Pt states that there is swelling to his penis and pain to his buttocks. He is experiencing dysuria and numbness down his leg. He denies back pain, bowel trouble, nausea, vomiting. Pt is seen in the infectious disease clinic and has been taking his medications regularly. Pt states that sitting on his buttocks makes the pain worse. He has taken nothing for the pain  This patient is brought to the operating room for Fournier's gangrene.  A nephrology consultation was called due to  his significantly impaired renal function, hyperkalemia, and metabolic acidosis. He will need to be [facility] for [facility].        Past Medical History  Diagnosis Date  . Hypertension   . Renal disorder   . HIV (human immunodeficiency virus infection)     History reviewed. No pertinent past surgical history.   ROS: [x]  Positive  [ ]  Denies    General: [ ]  Weight loss, [ ]  Fever, [ ]  chills Neurologic: [ ]  Dizziness, [ ]  Blackouts, [ ]  Seizure [ ]  Stroke, [ ]  "Mini stroke", [ ]  Slurred speech, [ ]  Temporary blindness; [ ]  weakness in arms or legs, [ ]  Hoarseness Cardiac: [ ]  Chest pain/pressure, [ ]  Shortness of breath at rest [ ]  Shortness of breath  with exertion, [ ]  Atrial fibrillation or irregular heartbeat Vascular: [ ]  Pain in legs with walking, [ ]  Pain in legs at rest, [ ]  Pain in legs at night,  [ ]  Non-healing ulcer, [ ]  Blood clot in vein/DVT,   Pulmonary: [ ]  Home oxygen, [ ]  Productive cough, [ ]  Coughing up blood, [ ]  Asthma,  [ ]  Wheezing Musculoskeletal:  [ ]  Arthritis, [ ]  Low back pain, [ ]  Joint pain Hematologic: [ ]  Easy Bruising, [ ]  Anemia; [ ]  Hepatitis Gastrointestinal: [ ]  Blood in stool, [ ]  Gastroesophageal Reflux/heartburn, [ ]  Trouble swallowing Urinary: [ ]  chronic Kidney disease, [ ]  on HD - [ ]  MWF or [ ]  TTHS, [ ]  Burning with urination, [ ]  Difficulty urinating Skin: [ ]  Rashes, [x ] Wounds Psychological: [ ]  Anxiety, [ ]  Depression  Social History History  Substance Use Topics  . Smoking status: Never Smoker   . Smokeless tobacco: Never Used  . Alcohol Use: Yes     1 beer 2-3 x per month    Family History Family History  Problem Relation Age of Onset  . Hypertension Mother   . Hypertension Father     No Known Allergies  Current Facility-Administered Medications  Medication Dose Route Frequency Provider Last Rate Last Dose  . clindamycin (CLEOCIN) IVPB 900 mg  900 mg Intravenous Q8H Gardiner Barefoot, MD   900 mg at 02/19/12 2130  . desmopressin (DDAVP) 25 mcg in sodium  chloride 0.9 % 50 mL IVPB  25 mcg Intravenous Once Alyson Reedy, MD   25 mcg at 02/18/12 1248  . heparin injection 1,000-6,000 Units  1,000-6,000 Units CRRT PRN Maree Krabbe, MD      . lamiVUDine (EPIVIR) 10 MG/ML solution 150 mg  150 mg Oral Daily Gardiner Barefoot, MD   150 mg at 02/19/12 0954  . lopinavir-ritonavir (KALETRA) 400-100 MG/5ML solution 400 mg  5 mL Oral BID WC Gardiner Barefoot, MD   400 mg at 02/19/12 0954  . morphine 2 MG/ML injection 2-4 mg  2-4 mg Intravenous Q2H PRN Martina Sinner, MD   2 mg at 02/18/12 1248  . ondansetron (ZOFRAN) injection 4 mg  4 mg Intravenous Q4H PRN Scott A MacDiarmid, MD      .  pantoprazole (PROTONIX) injection 40 mg  40 mg Intravenous QHS Alyson Reedy, MD   40 mg at 02/18/12 2146  . paricalcitol (ZEMPLAR) capsule 1 mcg  1 mcg Oral Daily Maryruth Bun Rama, MD   1 mcg at 02/19/12 0955  . piperacillin-tazobactam (ZOSYN) IVPB 3.375 g  3.375 g Intravenous Q6H Scott A MacDiarmid, MD   3.375 g at 02/19/12 1154  . prismasol BGK 4/2.5 5,000 mL dialysis replacement fluid   CRRT Continuous Maree Krabbe, MD      . prismasol BGK 4/2.5 5,000 mL dialysis replacement fluid   CRRT Continuous Maree Krabbe, MD      . prismasol BGK 4/2.5 5,000 mL dialysis solution   CRRT Continuous Maree Krabbe, MD 2,000 mL/hr at 02/19/12 [phone]    . sodium chloride 0.9 % primer fluid for CRRT   CRRT PRN Maree Krabbe, MD      . vancomycin (VANCOCIN) IVPB 1000 mg/200 mL premix  1,000 mg Intravenous Q24H Martina Sinner, MD   1,000 mg at 02/19/12 0808  . white petrolatum (VASELINE) gel           . Zidovudine (RETROVIR) 50 MG/5ML syrup 300 mg  300 mg Oral Q12H Gardiner Barefoot, MD   300 mg at 02/19/12 2956     Imaging: US Renal Port  02/18/2012  *RADIOLOGY REPORT*  Clinical Data: Acute renal failure.  History of hypertension and HIV infection.  Question hydronephrosis.  RENAL/URINARY TRACT ULTRASOUND COMPLETE - PORTABLE  Comparison:  Abdominal CT 02/17/2012.  Abdominal ultrasound 07/13/2010.  Findings:  Right Kidney:  There is cortical thinning with diffusely increased echogenicity.  No hydronephrosis or focal renal abnormality is identified.  Renal length is 8.3 cm.  Left Kidney:  There is cortical thinning with diffusely increased echogenicity.  No hydronephrosis is present.  There is a small cyst in the interpolar region measuring 12 mm maximally.  Renal length is 9.7 cm.  Bladder:  Decompressed by Foley catheter.  IMPRESSION:  1.  Stable renal ultrasound with bilaterally increased echogenicity consistent with chronic medical renal disease. 2.  No hydronephrosis.  Original Report Authenticated  By: Gerrianne Scale, M.D.   Dg Chest Portable 1 View  02/19/2012  *RADIOLOGY REPORT*  Clinical Data: Hemoptysis.  PORTABLE CHEST - 1 VIEW  Comparison: Chest x-ray 02/18/2012.  Findings: There is a left-sided internal jugular central venous catheter with tip terminating in the near the confluence of the left innominate and superior vena cava. Lung volumes are low.  No consolidative airspace disease.  No definite pleural effusions. Pulmonary venous congestion without frank pulmonary edema.  Mild enlargement of the cardiopericardial silhouette, increased compared to prior  examination. The patient is rotated to the right on today's exam, resulting in distortion of the mediastinal contours and reduced diagnostic sensitivity and specificity for mediastinal pathology.  Atherosclerotic calcifications within the arch of the aorta.  IMPRESSION: 1.  Support apparatus, as above. 2.  Low lung volumes with increasing enlargement of the cardiopericardial silhouette (which may reflect increasing cardiac dilatation and/or the presence of pericardial effusion) and pulmonary venous congestion (without frank pulmonary edema). 3.  Atherosclerosis.  Original Report Authenticated By: Florencia Reasons, M.D.   Dg Chest Portable 1 View  02/18/2012  *RADIOLOGY REPORT*  Clinical Data: Follow up hemoptysis.  PORTABLE CHEST - 1 VIEW  Comparison: 02/18/2012  Findings: Left jugular central line tip is near the upper SVC. Heart size is upper limits of normal.  Few densities at the left lung base are suggestive for atelectasis.  Remainder of the lungs are clear.  No evidence for a pneumothorax.  Trachea is midline.  IMPRESSION:  Left basilar atelectasis.  Stable position of the central line.  Original Report Authenticated By: Richarda Overlie, M.D.   Dg Chest Port 1 View  02/18/2012  *RADIOLOGY REPORT*  Clinical Data: Respiratory failure  PORTABLE CHEST - 1 VIEW  Comparison: 02/17/2012  Findings: Left IJ catheter terminates at the junction of  the left brachiocephalic vein and superior vena cava, directed laterally. Cardiomegaly is stable.  Lung volumes are low.  There is mild peribronchial thickening at the lung bases.  Otherwise, the lungs appear clear.  No visible pleural effusion or pneumothorax.  IMPRESSION: Mild peribronchial thickening at the lung bases.  Otherwise, the lungs appear clear.  Original Report Authenticated By: Britta Mccreedy, M.D.   Dg Chest Port 1 View  02/17/2012  *RADIOLOGY REPORT*  Clinical Data: Line placement  PORTABLE CHEST - 1 VIEW  Comparison: 02/17/2012 at 1536 hours  Findings: Left IJ dual lumen catheter with its tip at the junction of the left brachiocephalic vein and SVC.  Lungs are essentially clear. No pleural effusion or pneumothorax.  Mild cardiomegaly.  IMPRESSION: Left IJ dual lumen catheter with its tip at the junction of the left brachiocephalic vein and SVC.  No pneumothorax.  Original Report Authenticated By: Charline Bills, M.D.   Dg Chest Port 1 View  02/17/2012  *RADIOLOGY REPORT*  Clinical Data: Pulmonary edema  PORTABLE CHEST - 1 VIEW  Comparison: None.  Findings: Cardiomediastinal silhouette is unremarkable.  No acute infiltrate or pleural effusion.  No pulmonary edema.  Bony thorax is unremarkable.  IMPRESSION: No active disease.  Original Report Authenticated By: Natasha Mead, M.D.    Significant Diagnostic Studies: CBC Lab Results  Component Value Date   WBC 16.2* 02/19/2012   HGB 7.0* 02/19/2012   HCT 19.0* 02/19/2012   MCV 81.2 02/19/2012   PLT 151 02/19/2012    BMET    Component Value Date/Time   NA 136 02/19/2012 0500   K 4.1 02/19/2012 0500   CL 97 02/19/2012 0500   CO2 30 02/19/2012 0500   GLUCOSE 98 02/19/2012 0500   BUN 44* 02/19/2012 0500   CREATININE 3.02* 02/19/2012 0500   CREATININE 4.51* 04/17/2011 1521   CALCIUM 8.4 02/19/2012 0500   GFRNONAA 21* 02/19/2012 0500   GFRAA 24* 02/19/2012 0500    COAG Lab Results  Component Value Date   INR 1.40 02/18/2012   INR 1.53* 02/17/2012     No results found for this basename: PTT     Physical Examination   Pulse Readings from Last 3 Encounters:  02/19/12  73  02/19/12 73  02/13/12 75    General:  WDWN in NAD HENT: WNL Eyes: Pupils equal Pulmonary: normal non-labored breathing , without Rales, rhonchi,  wheezing Cardiac: RRR, without  Murmurs, rubs or gallops; No carotid bruits Abdomen: soft, NT, no masses Skin: no rashes, ulcers noted Vascular Exam/Pulses:radial pulses intact and equal bilaterally  Speech is fluent/normal  Non-Invasive Vascular Imaging: Pending vein mapping  ASSESSMENT: CKD PLAN:AV fistula pending vein mapping.  Patient is right handed.   I have examined the patient, reviewed and agree with above. Dialysis via a temporary left IJ catheter currently. Discussed need for permanent access. We'll plan for a tunneled catheter and access tomorrow 02/20/2012 pending vein map with Dr. Edilia Bo  Keyosha Tiedt, MD 02/19/2012 4:32 PM

## 2012-02-19 NOTE — Progress Notes (Signed)
Dressing changed Good granulation Minimal necrotic superficial tissue Two of three drains removed  Irrigated Wet to dry See orders

## 2012-02-19 NOTE — Care Management Note (Signed)
    Page 1 of 1   02/22/2012     1:50:51 PM   CARE MANAGEMENT NOTE 02/22/2012  Patient:  Bryan Gilmore, Bryan Gilmore   Account Number:  [id number]  Date Initiated:  02/19/2012  Documentation initiated by:  [name]  Subjective/Objective Assessment:   Fourniers gangrene and perirectal abscess. Renal failure - requiring dialysis.     Action/Plan:   Anticipated DC Date:  02/26/2012   Anticipated DC Plan:  HOME W HOME HEALTH SERVICES      DC Planning Services  CM consult      Choice offered to / List presented to:             Status of service:  In process, will continue to follow Medicare Important Message given?   (If response is "NO", the following Medicare IM given date fields will be blank) Date Medicare IM given:   Date Additional Medicare IM given:    Discharge Disposition:    Per UR Regulation:  Reviewed for med. necessity/level of care/duration of stay  If discussed at Long Length of Stay Meetings, dates discussed:    Comments:  PCP - does not have one currently - in past has seen [facility].  [address] [name]  home [phone] or cell [phone]  02-22-12 1:30pm [name], RNBSN - [phone] Talked more with Mr. Matlack about discharge plan.  Does feel he will need assistance at home - new dialysis and probably wound care on discharge.  Discussed options - would be interested in [facility] - insurance only in network at [facility]. Discussed with physician - order recieved.  Referred  to [facility].

## 2012-02-19 NOTE — Progress Notes (Addendum)
Patient ID: Bryan Gilmore, male   DOB: [birth year], 61 y.o.   MRN: [id number]   [facility] Progress Note    Subjective:   The patient reports no emerging complaints- nursing staff note persistent lethargy. Tolerated CRRT overnight and put out about UOP overnight.   Objective:   BP 123/69  Pulse 67  Temp(Src) 97.6 F (36.4 C) (Oral)  Resp 19  Ht 5\' 11"  (1.803 m)  Wt 107.5 kg (236 lb 15.9 oz)  BMI 33.05 kg/m2  SpO2 99%  Intake/Output Summary (Last 24 hours) at 02/19/12 7829 Last data filed at 02/19/12 0503  Gross per 24 hour  Intake  897.5 ml  Output    605 ml  Net  292.5 ml   Weight change: -1.5 kg (-3 lb 4.9 oz)  Physical Exam: FAO:ZHYQMVHQI/ONGEXBMWU but easily aroused by voice and answers questions XLK:GMWNU RRR, S1 and S2 with 36ESM Resp:CTA bilaterally, no rales/rhonchi UVO:ZDGU, obese, minimally tender over lower quadrants Ext:No LE edema  Imaging: US Renal Port  02/18/2012  *RADIOLOGY REPORT*  Clinical Data: Acute renal failure.  History of hypertension and HIV infection.  Question hydronephrosis.  RENAL/URINARY TRACT ULTRASOUND COMPLETE - PORTABLE  Comparison:  Abdominal CT 02/17/2012.  Abdominal ultrasound 07/13/2010.  Findings:  Right Kidney:  There is cortical thinning with diffusely increased echogenicity.  No hydronephrosis or focal renal abnormality is identified.  Renal length is 8.3 cm.  Left Kidney:  There is cortical thinning with diffusely increased echogenicity.  No hydronephrosis is present.  There is a small cyst in the interpolar region measuring 12 mm maximally.  Renal length is 9.7 cm.  Bladder:  Decompressed by Foley catheter.  IMPRESSION:  1.  Stable renal ultrasound with bilaterally increased echogenicity consistent with chronic medical renal disease. 2.  No hydronephrosis.  Original Report Authenticated By: Gerrianne Scale, M.D.   Dg Chest Portable 1 View  02/18/2012  *RADIOLOGY REPORT*  Clinical Data: Follow up hemoptysis.   PORTABLE CHEST - 1 VIEW  Comparison: 02/18/2012  Findings: Left jugular central line tip is near the upper SVC. Heart size is upper limits of normal.  Few densities at the left lung base are suggestive for atelectasis.  Remainder of the lungs are clear.  No evidence for a pneumothorax.  Trachea is midline.  IMPRESSION:  Left basilar atelectasis.  Stable position of the central line.  Original Report Authenticated By: Richarda Overlie, M.D.   Dg Chest Port 1 View  02/18/2012  *RADIOLOGY REPORT*  Clinical Data: Respiratory failure  PORTABLE CHEST - 1 VIEW  Comparison: 02/17/2012  Findings: Left IJ catheter terminates at the junction of the left brachiocephalic vein and superior vena cava, directed laterally. Cardiomegaly is stable.  Lung volumes are low.  There is mild peribronchial thickening at the lung bases.  Otherwise, the lungs appear clear.  No visible pleural effusion or pneumothorax.  IMPRESSION: Mild peribronchial thickening at the lung bases.  Otherwise, the lungs appear clear.  Original Report Authenticated By: Britta Mccreedy, M.D.   Dg Chest Port 1 View  02/17/2012  *RADIOLOGY REPORT*  Clinical Data: Line placement  PORTABLE CHEST - 1 VIEW  Comparison: 02/17/2012 at 1536 hours  Findings: Left IJ dual lumen catheter with its tip at the junction of the left brachiocephalic vein and SVC.  Lungs are essentially clear. No pleural effusion or pneumothorax.  Mild cardiomegaly.  IMPRESSION: Left IJ dual lumen catheter with its tip at the junction of the left brachiocephalic vein and SVC.  No pneumothorax.  Original Report Authenticated By: Charline Bills, M.D.   Dg Chest Port 1 View  02/17/2012  *RADIOLOGY REPORT*  Clinical Data: Pulmonary edema  PORTABLE CHEST - 1 VIEW  Comparison: None.  Findings: Cardiomediastinal silhouette is unremarkable.  No acute infiltrate or pleural effusion.  No pulmonary edema.  Bony thorax is unremarkable.  IMPRESSION: No active disease.  Original Report Authenticated By: Natasha Mead,  M.D.   Dg C-arm 1-60 Min  02/17/2012  *RADIOLOGY REPORT*  Clinical Data:  Fournier's gangrene  RETROGRADE URETHROGRAM  Comparison:  None.  Fluoroscopy Time:  45 seconds  This exam was performed by Dr. Sherron Monday.  Findings:  Two spot fluoroscopic views of the lower pelvis were performed.  On image #1, there is a small-bore catheter projecting over the penis, with contrast in the urinary bladder.  Bladder is normal in shape. Contrast is seen with an irregular shape, projecting over the expected location of the penis.  It is uncertain if this is some contrast extravasation within the soft tissues of the penis, distal urethral injury, or contrast contamination outside of the patient.  The second image shows a larger bore Foley catheter satisfactorily positioned, with the balloon inflated in the lower urinary bladder and contrast within the urinary bladder.  The previously described contrast extravasation over the penile region is no longer present. Question if it could be in a contamination adjacent to the patient. Numerous irregular locules of gas project over the perineum, consistent with the Fournier's gangrene seen by CT.  IMPRESSION: Two intraoperative images are submitted as described above.  Please also see Dr. Mina Marble report.  Original Report Authenticated By: Britta Mccreedy, M.D.    Labs: BMET  Lab 02/19/12 0500 02/18/12 1858 02/18/12 0500 02/17/12 1830 02/17/12 1230 02/17/12 0616  NA 136 134* 135 132* 128* 129*  K 4.1 4.1 4.0 5.3* 6.1* 5.8*  CL 97 89* 92* 99 97 97  CO2 30 31 25  12* 9* 7*  GLUCOSE 98 101* 96 101* 111* 93  BUN 44* 64* 98* 121* 116* 114*  CREATININE 3.02* 4.05* 5.95* 8.51* 8.62* 8.63*  ALB -- -- -- -- -- --  CALCIUM 8.4 7.5* 7.8* 8.0* 8.5 9.0  PHOS 4.4 5.2* 6.6*6.6* 9.5* -- --   CBC  Lab 02/19/12 0500 02/18/12 1800 02/18/12 0500 02/17/12 1830 02/17/12 0616  WBC 16.2* 19.3* 21.7* 28.0* --  NEUTROABS -- -- -- 25.8* 25.5*  HGB 7.0* 6.6* 7.2* 6.2* --  HCT 19.0* 18.0*  20.0* 17.3* --  MCV 81.2 79.6 80.3 83.2 --  PLT 151 164 184 185 --    Medications:      . clindamycin (CLEOCIN) IV  900 mg Intravenous Q8H  . desmopressin (DDAVP) IV  25 mcg Intravenous Once  . lamiVUDine  150 mg Oral Daily  . lopinavir-ritonavir  5 mL Oral BID WC  . pantoprazole (PROTONIX) IV  40 mg Intravenous QHS  . paricalcitol  1 mcg Oral Daily  . piperacillin-tazobactam  3.375 g Intravenous Q6H  . vancomycin  1,000 mg Intravenous Q24H  . white petrolatum      . Zidovudine  300 mg Oral Q12H  . DISCONTD: desmopressin  25 mcg Intravenous Once     Assessment/ Plan:   1. 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 Will check with our office in am to see if he's been there. Will continue CRRT today to sustain clearance and correction of electrolyte abnormalities, then stop tomorrow and transition to IHD as  needed if no renal recovery. Kidneys are atrophic on CT scan- renal US ordered to assess size. Would not be surprised if this is end-stage renal failure. 2. Anemia- s/p 1 unit PRBC overnight with minimal rise in Hgb overnight. No overt losses after epistaxis episode. Start ESA.  3. Metabolic alkalosis: Iatrogenic following correction of acidosis- dialysate changed to avoid further alkalosis/resp depression. 4. Fournier's gangrene- s/p I&D, on IV abx 5. 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 +proteinuria, ordered ANA, SPEP and UPEP, hep B, save L arm and do vein mapping 6. Hep C/HIV- on ARVs per primary service 7. AMS - No significant improvement overnight per nursing staff (unable to assess independently as this is my first contact with pt).   Verified from the clinic- he sees Dr.Mattingly in f/u at [facility] and was supposed to get evaluated by [facility] for permanent access on 02/02/2012  Zetta Bills, MD 02/19/2012, 7:12 AM

## 2012-02-19 NOTE — Progress Notes (Signed)
Name: Bryan Gilmore MRN: [id number] DOB: 06/26/1951    LOS: 2  Referring Provider:  Urology Reason for Referral:  Respiratory failure and acidosis.  PULMONARY / CRITICAL CARE MEDICINE  HPI:  61 year old male with HIV PMH presented to [facility] with 1 wk history of testicular discomfort and swelling, dysuria and difficulty urinating. In ER was noted to have Fournier gangrene and perineal abscesses and was taken to the OR for surgical correction 6/8.  Patient was also noted to have evolving renal failure and profound acidosis.  Renal was consulted and recommended CVVH and pt was tx to [facility] under PCCM.   Subjective/Overnight: ddavp for hemoptysis  Vital Signs: Temp:  [97.6 F (36.4 C)-98.3 F (36.8 C)] 97.6 F (36.4 C) (06/10 0406) Pulse Rate:  [25-143] 73  (06/10 0800) Resp:  [12-23] 13  (06/10 1000) BP: (90-134)/(38-100) 104/85 mmHg (06/10 1000) SpO2:  [88 %-100 %] 96 % (06/10 0800) FiO2 (%):  [3 %-40 %] 3 % (06/10 0800) Weight:  [107.5 kg (236 lb 15.9 oz)] 107.5 kg (236 lb 15.9 oz) (06/10 0400)  Physical Examination: General: chronically ill appearing male, NAD  Neuro: awake, alert, calm  CV: s1s2 rrr PULM: resps even non labored, few scattered ronchi GI: abd soft, +bs Extremities: warm and dry, no edema    Principal Problem:  *Sepsis secondary to suspected Fornier's gangrene Active Problems:  HIV INFECTION  ARF (acute renal failure) in the setting of stage III CKD  Metabolic acidosis  Hyperkalemia  Normocytic anemia  Hyponatremia  Acute respiratory failure   ASSESSMENT AND PLAN  PULMONARY  Lab 02/18/12 2007 02/18/12 0525 02/17/12 1757 02/17/12 1242  PHART 7.551* -- 7.298* 7.129*  PCO2ART 36.4 -- 26.1* 24.9*  PO2ART 152.0* -- 216.0* 195.0*  HCO3 32.1* 27.0* 12.7* 7.9*  O2SAT 100.0 71.0 100.0 98.4   Ventilator Settings:  CXR:  6/9>> int prom  ETT:  none  A:  Acute respiratory distress - respiratory compensation in setting metabolic acidosis Hemoptysis P:   abg  reviewed pcxr repeat in am  ddavp provided ? Etiology of hemoptysis. CXR essentially clear. chf related?  ?injury during intubation for OR v DIC.  Consider FOB if persists , avoid now as would need intubation fo this See heme  Of recent: no further bleeding  CARDIOVASCULAR  Lab 02/17/12 1830 02/17/12 0651  TROPONINI -- --  LATICACIDVEN 1.1 1.7  PROBNP 42513.0* --   ECG:  NSR Lines:  L IJ HD cath 6/8>>>  A: SIRS - likely r/t suspected Fournier's gangrene with peroneal and perirectal abscesses.  BP ok.  Lactate not impressive.  P:  Remains nomrmotensive tele  RENAL  Lab 02/19/12 0500 02/18/12 1858 02/18/12 0500 02/17/12 1830 02/17/12 1230  NA 136 134* 135 132* 128*  K 4.1 4.1 -- -- --  CL 97 89* 92* 99 97  CO2 30 31 25  12* 9*  BUN 44* 64* 98* 121* 116*  CREATININE 3.02* 4.05* 5.95* 8.51* 8.62*  CALCIUM 8.4 7.5* 7.8* 8.0* 8.5  MG 2.4 -- 2.1 2.2 --  PHOS 4.4 5.2* 6.6*6.6* 9.5* --   Intake/Output      06/09 0701 - 06/10 0700 06/10 0701 - 06/11 0700   I.V. (mL/kg)     Blood 347.5    IV Piggyback 600 200   Total Intake(mL/kg) 947.5 (8.8) 200 (1.9)   Urine (mL/kg/hr) 360 (0.1) 40 (0.1)   Other 453 99   Blood     Total Output 813 139   Net +134.5 +  61         Foley:  6/8  A:  Acute on chronic renal failure - ?progression of underlying disease (prob stage IV CKD with baseline SCr 2.47-2.78) c/b ATN in setting SIRS, volume depletion, ?drug toxicity.  Metabolic acidosis  P:   Per renal, cvhd to remain F/u chem   GASTROINTESTINAL  Lab 02/19/12 0500 02/18/12 1858 02/18/12 0500 02/17/12 1830 02/17/12 0850  AST -- -- -- 23 22  ALT -- -- -- 10 12  ALKPHOS -- -- -- 197* 105  BILITOT -- -- -- 0.8 0.6  PROT -- -- -- 6.3 7.5  ALBUMIN 1.8* 1.8* 1.8* 1.8* 2.3*    A:  No acute issues  P:   NPO for now , follow resp status , hemoptysis, prior to diet start  HEMATOLOGIC  Lab 02/19/12 0500 02/18/12 1800 02/18/12 0500 02/17/12 1830 02/17/12 1230  HGB 7.0* 6.6* 7.2*  6.2* 8.7*  HCT 19.0* 18.0* 20.0* 17.3* 25.2*  PLT 151 164 184 185 212  INR -- -- 1.40 1.53* --  APTT 33 -- 32 33 --   A:  Anemia - ?r/t hemoptysis.  Coags wnl.  P:  No further hemoptysis Cbc in pm , hemodynamics are good Neg balance for hemoconcentration as goal on pm cbc coas wnl No role repeat ddavp, tachyphylaxis  INFECTIOUS  Lab 02/19/12 0500 02/18/12 1800 02/18/12 0500 02/17/12 1830 02/17/12 1230 02/17/12 0620  WBC 16.2* 19.3* 21.7* 28.0* 38.3* --  PROCALCITON -- -- -- 13.97 -- 10.04   Cultures: BCx2 6/8>>> Urine 6/8>>> Wound (perirectal abscess) 6/8>>>  Antibiotics: Zosyn 6/8>>> Vancomycin 6/8>>> clinda 6/9>>>  A:  Fournier's gangrene - s/p I/D 6/8.      HIV - last CD4 =440 04/17/11 P:   Repeat CD4 pending  Cont HIV rx  ID on board With resolved pressors, could consider dc clinda  ENDOCRINE  A:   No acute issues  P:   Monitor blood sugars on bmet   NEUROLOGIC  A:  AMS - in setting SIRS and acute on chronic renal failure.  Somewhat improved.  P:   Supportive care   BEST PRACTICE / DISPOSITION Level of Care:  ICU Primary Service:  PCCM Consultants:  Renal, ID, urology  Code Status:  full Diet:  NPO DVT Px:  SCD's GI Px:  protonix Skin Integrity:  Peroneal wounds/ surgical incisions  Social / Family:  No family available    Ccm 30 in   Mcarthur Rossetti. Tyson Alias, MD, FACP Pgr: [phone] [facility]

## 2012-02-19 NOTE — Progress Notes (Signed)
INFECTIOUS DISEASE PROGRESS NOTE  ID: Bryan Gilmore is a 61 y.o. male with HIV, CD 4 count of 440, VL 16,000 (in Aug 2012) off of ART in the last year, presents with fournier's gangrene and sepsis s/p emergent debridement but no cultures sent from OR on 6/8 started on vanco and piptazo, WBC down to 16.2 from 38. ART restarted  Subjective: 2 of 3 drains removed by surgery. He states that they plan to have repeat debridement tomorrow. He denies fever chills, or abdominal pain  Abtx:  vanco piptazo clinda #2  Medications:    . clindamycin (CLEOCIN) IV  900 mg Intravenous Q8H  . desmopressin (DDAVP) IV  25 mcg Intravenous Once  . lamiVUDine  150 mg Oral Daily  . lopinavir-ritonavir  5 mL Oral BID WC  . pantoprazole (PROTONIX) IV  40 mg Intravenous QHS  . paricalcitol  1 mcg Oral Daily  . piperacillin-tazobactam  3.375 g Intravenous Q6H  . vancomycin  1,000 mg Intravenous Q24H  . white petrolatum      . Zidovudine  300 mg Oral Q12H   Objective: Vital signs in last 24 hours: Temp:  [97.6 F (36.4 C)-98.3 F (36.8 C)] 97.6 F (36.4 C) (06/10 0406) Pulse Rate:  [25-143] 67  (06/10 0700) Resp:  [13-23] 19  (06/10 0700) BP: (90-137)/(38-100) 123/69 mmHg (06/10 0700) SpO2:  [88 %-100 %] 99 % (06/10 0700) FiO2 (%):  [3 %-40 %] 3 % (06/10 0500) Weight:  [236 lb 15.9 oz (107.5 kg)] 236 lb 15.9 oz (107.5 kg) (06/10 0400) General appearance: alert when awakened but seems to answer questions appropriately  Neck: left IJ catheter in place with CVVH, no erythema  Cardio: regular rate and rhythm, S1, S2 normal, no murmur, click, rub or gallop  GI: soft, non-tender; bowel sounds normal; no masses, no organomegaly  Pelvic: wrapped but surrounding area without erythema or induration.  Extremities: edema mild    Lab Results  Basename 02/19/12 0500 02/18/12 1858 02/18/12 1800  WBC 16.2* -- 19.3*  HGB 7.0* -- 6.6*  HCT 19.0* -- 18.0*  NA 136 134* --  K 4.1 4.1 --  CL 97 89* --  CO2 30 31  --  BUN 44* 64* --  CREATININE 3.02* 4.05* --  GLU -- -- --   Liver Panel  Basename 02/19/12 0500 02/18/12 1858 02/17/12 1830 02/17/12 0850  PROT -- -- 6.3 7.5  ALBUMIN 1.8* 1.8* -- --  AST -- -- 23 22  ALT -- -- 10 12  ALKPHOS -- -- 197* 105  BILITOT -- -- 0.8 0.6  BILIDIR -- -- -- 0.5*  IBILI -- -- -- 0.1*    Microbiology: Recent Results (from the past 240 hour(s))  CULTURE, BLOOD (ROUTINE X 2)     Status: Normal (Preliminary result)   Collection Time   02/17/12  8:50 AM      Component Value Range Status Comment   Specimen Description BLOOD RIGHT ARM   Final    Special Requests     Final    Value: BOTTLES DRAWN AEROBIC AND ANAEROBIC 2CC BOTH BOTTLES   Culture  Setup Time [id number]   Final    Culture     Final    Value:        BLOOD CULTURE RECEIVED NO GROWTH TO DATE CULTURE WILL BE HELD FOR 5 DAYS BEFORE ISSUING A FINAL NEGATIVE REPORT   Report Status PENDING   Incomplete   WOUND CULTURE     Status: Normal (  Preliminary result)   Collection Time   02/17/12 12:00 PM      Component Value Range Status Comment   Specimen Description ABSCESS PERIRECTAL   Final    Special Requests NONE   Final    Gram Stain PENDING   Incomplete    Culture NO GROWTH 2 DAYS   Final    Report Status PENDING   Incomplete   ANAEROBIC CULTURE     Status: Normal (Preliminary result)   Collection Time   02/17/12 12:00 PM      Component Value Range Status Comment   Specimen Description ABSCESS PERIRECTAL   Final    Special Requests NONE   Final    Gram Stain PENDING   Incomplete    Culture     Final    Value: NO ANAEROBES ISOLATED; CULTURE IN PROGRESS FOR 5 DAYS   Report Status PENDING   Incomplete   MRSA PCR SCREENING     Status: Normal   Collection Time   02/17/12  1:57 PM      Component Value Range Status Comment   MRSA by PCR NEGATIVE  NEGATIVE  Final   URINE CULTURE     Status: Normal   Collection Time   02/17/12  2:55 PM      Component Value Range Status Comment   Specimen Description  URINE, CATHETERIZED   Final    Special Requests Immunocompromised   Final    Culture  Setup Time [id number]   Final    Colony Count NO GROWTH   Final    Culture NO GROWTH   Final    Report Status 02/18/2012 FINAL   Final   URINE CULTURE     Status: Normal   Collection Time   02/17/12  5:28 PM      Component Value Range Status Comment   Specimen Description URINE, CATHETERIZED   Final    Special Requests NONE   Final    Culture  Setup Time [id number]   Final    Colony Count NO GROWTH   Final    Culture NO GROWTH   Final    Report Status 02/18/2012 FINAL   Final   CULTURE, BLOOD (ROUTINE X 2)     Status: Normal (Preliminary result)   Collection Time   02/17/12  6:13 PM      Component Value Range Status Comment   Specimen Description BLOOD RIGHT ARM   Final    Special Requests BOTTLES DRAWN AEROBIC AND ANAEROBIC 10CC   Final    Culture  Setup Time [id number]   Final    Culture     Final    Value:        BLOOD CULTURE RECEIVED NO GROWTH TO DATE CULTURE WILL BE HELD FOR 5 DAYS BEFORE ISSUING A FINAL NEGATIVE REPORT   Report Status PENDING   Incomplete   CULTURE, BLOOD (ROUTINE X 2)     Status: Normal (Preliminary result)   Collection Time   02/17/12  6:30 PM      Component Value Range Status Comment   Specimen Description BLOOD LEFT HEMODIALYSIS CATHETER   Final    Special Requests BOTTLES DRAWN AEROBIC AND ANAEROBIC 10CC   Final    Culture  Setup Time [id number]   Final    Culture     Final    Value:        BLOOD CULTURE RECEIVED NO GROWTH TO DATE CULTURE WILL BE HELD FOR 5 DAYS BEFORE ISSUING A  FINAL NEGATIVE REPORT   Report Status PENDING   Incomplete     Studies/Results: US Renal Port  02/18/2012  *RADIOLOGY REPORT*  Clinical Data: Acute renal failure.  History of hypertension and HIV infection.  Question hydronephrosis.  RENAL/URINARY TRACT ULTRASOUND COMPLETE - PORTABLE  Comparison:  Abdominal CT 02/17/2012.  Abdominal ultrasound 07/13/2010.  Findings:  Right Kidney:  There  is cortical thinning with diffusely increased echogenicity.  No hydronephrosis or focal renal abnormality is identified.  Renal length is 8.3 cm.  Left Kidney:  There is cortical thinning with diffusely increased echogenicity.  No hydronephrosis is present.  There is a small cyst in the interpolar region measuring 12 mm maximally.  Renal length is 9.7 cm.  Bladder:  Decompressed by Foley catheter.  IMPRESSION:  1.  Stable renal ultrasound with bilaterally increased echogenicity consistent with chronic medical renal disease. 2.  No hydronephrosis.  Original Report Authenticated By: Gerrianne Scale, M.D.   Dg Chest Portable 1 View  02/19/2012  *RADIOLOGY REPORT*  Clinical Data: Hemoptysis.  PORTABLE CHEST - 1 VIEW  Comparison: Chest x-ray 02/18/2012.  Findings: There is a left-sided internal jugular central venous catheter with tip terminating in the near the confluence of the left innominate and superior vena cava. Lung volumes are low.  No consolidative airspace disease.  No definite pleural effusions. Pulmonary venous congestion without frank pulmonary edema.  Mild enlargement of the cardiopericardial silhouette, increased compared to prior examination. The patient is rotated to the right on today's exam, resulting in distortion of the mediastinal contours and reduced diagnostic sensitivity and specificity for mediastinal pathology.  Atherosclerotic calcifications within the arch of the aorta.  IMPRESSION: 1.  Support apparatus, as above. 2.  Low lung volumes with increasing enlargement of the cardiopericardial silhouette (which may reflect increasing cardiac dilatation and/or the presence of pericardial effusion) and pulmonary venous congestion (without frank pulmonary edema). 3.  Atherosclerosis.  Original Report Authenticated By: Florencia Reasons, M.D.   Dg Chest Portable 1 View  02/18/2012  *RADIOLOGY REPORT*  Clinical Data: Follow up hemoptysis.  PORTABLE CHEST - 1 VIEW  Comparison: 02/18/2012   Findings: Left jugular central line tip is near the upper SVC. Heart size is upper limits of normal.  Few densities at the left lung base are suggestive for atelectasis.  Remainder of the lungs are clear.  No evidence for a pneumothorax.  Trachea is midline.  IMPRESSION:  Left basilar atelectasis.  Stable position of the central line.  Original Report Authenticated By: Richarda Overlie, M.D.   Dg Chest Port 1 View  02/18/2012  *RADIOLOGY REPORT*  Clinical Data: Respiratory failure  PORTABLE CHEST - 1 VIEW  Comparison: 02/17/2012  Findings: Left IJ catheter terminates at the junction of the left brachiocephalic vein and superior vena cava, directed laterally. Cardiomegaly is stable.  Lung volumes are low.  There is mild peribronchial thickening at the lung bases.  Otherwise, the lungs appear clear.  No visible pleural effusion or pneumothorax.  IMPRESSION: Mild peribronchial thickening at the lung bases.  Otherwise, the lungs appear clear.  Original Report Authenticated By: Britta Mccreedy, M.D.   Dg Chest Port 1 View  02/17/2012  *RADIOLOGY REPORT*  Clinical Data: Line placement  PORTABLE CHEST - 1 VIEW  Comparison: 02/17/2012 at 1536 hours  Findings: Left IJ dual lumen catheter with its tip at the junction of the left brachiocephalic vein and SVC.  Lungs are essentially clear. No pleural effusion or pneumothorax.  Mild cardiomegaly.  IMPRESSION: Left IJ dual  lumen catheter with its tip at the junction of the left brachiocephalic vein and SVC.  No pneumothorax.  Original Report Authenticated By: Charline Bills, M.D.   Dg Chest Port 1 View  02/17/2012  *RADIOLOGY REPORT*  Clinical Data: Pulmonary edema  PORTABLE CHEST - 1 VIEW  Comparison: None.  Findings: Cardiomediastinal silhouette is unremarkable.  No acute infiltrate or pleural effusion.  No pulmonary edema.  Bony thorax is unremarkable.  IMPRESSION: No active disease.  Original Report Authenticated By: Natasha Mead, M.D.   Dg C-arm 1-60 Min  02/17/2012  *RADIOLOGY  REPORT*  Clinical Data:  Fournier's gangrene  RETROGRADE URETHROGRAM  Comparison:  None.  Fluoroscopy Time:  45 seconds  This exam was performed by Dr. Sherron Monday.  Findings:  Two spot fluoroscopic views of the lower pelvis were performed.  On image #1, there is a small-bore catheter projecting over the penis, with contrast in the urinary bladder.  Bladder is normal in shape. Contrast is seen with an irregular shape, projecting over the expected location of the penis.  It is uncertain if this is some contrast extravasation within the soft tissues of the penis, distal urethral injury, or contrast contamination outside of the patient.  The second image shows a larger bore Foley catheter satisfactorily positioned, with the balloon inflated in the lower urinary bladder and contrast within the urinary bladder.  The previously described contrast extravasation over the penile region is no longer present. Question if it could be in a contamination adjacent to the patient. Numerous irregular locules of gas project over the perineum, consistent with the Fournier's gangrene seen by CT.  IMPRESSION: Two intraoperative images are submitted as described above.  Please also see Dr. Mina Marble report.  Original Report Authenticated By: Britta Mccreedy, M.D.     Assessment/Plan: Polymicrobial fournier's gangrene: continue with vancomycin and piptazo  HIV= continue on current regimen of kaletra, lamivudine, zidovudine for now.  Leotis Isham Infectious Diseases 02/19/2012, 10:00 AM

## 2012-02-20 ENCOUNTER — Encounter (HOSPITAL_COMMUNITY)
Admission: EM | Disposition: A | Discharge status: Other Acute Inpatient Hospital | Payer: Self-pay | Source: Home / Self Care | Attending: Pulmonary Disease

## 2012-02-20 ENCOUNTER — Encounter (HOSPITAL_COMMUNITY): Payer: Self-pay | Admitting: Anesthesiology

## 2012-02-20 ENCOUNTER — Inpatient Hospital Stay (HOSPITAL_COMMUNITY): Payer: BC Managed Care – PPO | Admitting: Anesthesiology

## 2012-02-20 ENCOUNTER — Inpatient Hospital Stay (HOSPITAL_COMMUNITY): Payer: BC Managed Care – PPO

## 2012-02-20 DIAGNOSIS — R339 Retention of urine, unspecified: Secondary | ICD-10-CM | POA: Diagnosis not present

## 2012-02-20 DIAGNOSIS — N19 Unspecified kidney failure: Secondary | ICD-10-CM

## 2012-02-20 DIAGNOSIS — B199 Unspecified viral hepatitis without hepatic coma: Secondary | ICD-10-CM | POA: Diagnosis not present

## 2012-02-20 DIAGNOSIS — N179 Acute kidney failure, unspecified: Secondary | ICD-10-CM | POA: Diagnosis not present

## 2012-02-20 DIAGNOSIS — J9600 Acute respiratory failure, unspecified whether with hypoxia or hypercapnia: Secondary | ICD-10-CM | POA: Diagnosis not present

## 2012-02-20 HISTORY — PX: INSERTION OF DIALYSIS CATHETER: SHX1324

## 2012-02-20 LAB — WOUND CULTURE: Gram Stain: NONE SEEN

## 2012-02-20 LAB — GLUCOSE, CAPILLARY
Glucose-Capillary: 137 mg/dL — ABNORMAL HIGH (ref 70–99)
Glucose-Capillary: 61 mg/dL — ABNORMAL LOW (ref 70–99)

## 2012-02-20 LAB — RENAL FUNCTION PANEL
Albumin: 1.5 g/dL — ABNORMAL LOW (ref 3.5–5.2)
BUN: 23 mg/dL (ref 6–23)
BUN: 26 mg/dL — ABNORMAL HIGH (ref 6–23)
CO2: 24 mEq/L (ref 19–32)
Chloride: 101 mEq/L (ref 96–112)
Creatinine, Ser: 2.16 mg/dL — ABNORMAL HIGH (ref 0.50–1.35)
Glucose, Bld: 135 mg/dL — ABNORMAL HIGH (ref 70–99)
Phosphorus: 3.1 mg/dL (ref 2.3–4.6)
Potassium: 4.2 mEq/L (ref 3.5–5.1)
Potassium: 4.3 mEq/L (ref 3.5–5.1)

## 2012-02-20 LAB — MAGNESIUM: Magnesium: 2.4 mg/dL (ref 1.5–2.5)

## 2012-02-20 LAB — CBC
HCT: 15.3 % — ABNORMAL LOW (ref 39.0–52.0)
MCH: 28.7 pg (ref 26.0–34.0)
MCHC: 34 g/dL (ref 30.0–36.0)
MCV: 80.1 fL (ref 78.0–100.0)
Platelets: 99 10*3/uL — ABNORMAL LOW (ref 150–400)
RDW: 13.9 % (ref 11.5–15.5)
RDW: 15.6 % — ABNORMAL HIGH (ref 11.5–15.5)
WBC: 9.7 10*3/uL (ref 4.0–10.5)

## 2012-02-20 LAB — PROTEIN ELECTROPHORESIS, SERUM
Alpha-1-Globulin: 13.4 % — ABNORMAL HIGH (ref 2.9–4.9)
Alpha-2-Globulin: 12.9 % — ABNORMAL HIGH (ref 7.1–11.8)
Beta 2: 7.5 % — ABNORMAL HIGH (ref 3.2–6.5)
Gamma Globulin: 23.4 % — ABNORMAL HIGH (ref 11.1–18.8)

## 2012-02-20 LAB — APTT: aPTT: 33 seconds (ref 24–37)

## 2012-02-20 LAB — UIFE/LIGHT CHAINS/TP QN, 24-HR UR
Albumin, U: DETECTED
Alpha 2, Urine: DETECTED — AB
Beta, Urine: DETECTED — AB

## 2012-02-20 LAB — KAPPA/LAMBDA LIGHT CHAINS
Kappa free light chain: 20.9 mg/dL — ABNORMAL HIGH (ref 0.33–1.94)
Kappa, lambda light chain ratio: 1.36 (ref 0.26–1.65)
Lambda free light chains: 15.4 mg/dL — ABNORMAL HIGH (ref 0.57–2.63)

## 2012-02-20 LAB — PREPARE RBC (CROSSMATCH)

## 2012-02-20 SURGERY — INSERTION OF DIALYSIS CATHETER
Anesthesia: Monitor Anesthesia Care | Site: Neck | Wound class: Clean

## 2012-02-20 MED ORDER — MIDAZOLAM HCL 2 MG/2ML IJ SOLN: 1.0000 mg | INTRAMUSCULAR | Status: DC | PRN

## 2012-02-20 MED ORDER — SODIUM CHLORIDE 0.9 % IJ SOLN
INTRAMUSCULAR | Status: AC
  Filled 2012-02-20: qty 20

## 2012-02-20 MED ORDER — FENTANYL CITRATE 0.05 MG/ML IJ SOLN: 50.0000 ug | INTRAMUSCULAR | Status: DC | PRN

## 2012-02-20 MED ORDER — BIOTENE DRY MOUTH MT LIQD
15.0000 mL | Freq: Two times a day (BID) | OROMUCOSAL | Status: DC
  Administered 2012-02-20 – 2012-03-01 (×17): 15 mL via OROMUCOSAL

## 2012-02-20 MED ORDER — DARBEPOETIN ALFA-POLYSORBATE 100 MCG/0.5ML IJ SOLN
100.0000 ug | INTRAMUSCULAR | Status: DC
  Administered 2012-02-21 – 2012-02-28 (×2): 100 ug via INTRAVENOUS
  Filled 2012-02-20 (×2): qty 0.5

## 2012-02-20 MED ORDER — DEXTROSE 50 % IV SOLN
25.0000 mL | Freq: Once | INTRAVENOUS | Status: AC | PRN
  Administered 2012-02-20: 25 mL via INTRAVENOUS

## 2012-02-20 MED ORDER — LORAZEPAM 2 MG/ML IJ SOLN: 1.0000 mg | Freq: Once | INTRAMUSCULAR | Status: DC | PRN

## 2012-02-20 MED ORDER — LIDOCAINE-EPINEPHRINE (PF) 1 %-1:200000 IJ SOLN
INTRAMUSCULAR | Status: DC | PRN
  Administered 2012-02-20: 20 mL via INTRADERMAL

## 2012-02-20 MED ORDER — SODIUM CHLORIDE 0.9 % IR SOLN
Status: DC | PRN
  Administered 2012-02-20: 10:00:00

## 2012-02-20 MED ORDER — SODIUM CHLORIDE 0.9 % IV SOLN
INTRAVENOUS | Status: DC
  Administered 2012-02-23: 10 mL/h via INTRAVENOUS
  Administered 2012-02-27: 20 mL/h via INTRAVENOUS
  Administered 2012-02-29: 22:00:00 via INTRAVENOUS

## 2012-02-20 MED ORDER — HYDROMORPHONE HCL PF 1 MG/ML IJ SOLN: 0.2500 mg | INTRAMUSCULAR | Status: DC | PRN

## 2012-02-20 MED ORDER — HEPARIN SODIUM (PORCINE) 1000 UNIT/ML IJ SOLN
INTRAMUSCULAR | Status: DC | PRN
  Administered 2012-02-20: 2 mL

## 2012-02-20 MED ORDER — PROPOFOL 10 MG/ML IV EMUL
INTRAVENOUS | Status: DC | PRN
  Administered 2012-02-20: 25 ug/kg/min via INTRAVENOUS

## 2012-02-20 MED ORDER — PIPERACILLIN-TAZOBACTAM IN DEX 2-0.25 GM/50ML IV SOLN
2.2500 g | Freq: Three times a day (TID) | INTRAVENOUS | Status: DC
  Administered 2012-02-20 – 2012-03-01 (×28): 2.25 g via INTRAVENOUS
  Filled 2012-02-20 (×34): qty 50

## 2012-02-20 MED ORDER — SODIUM CHLORIDE 0.9 % IV SOLN
INTRAVENOUS | Status: DC | PRN
  Administered 2012-02-20: 09:00:00 via INTRAVENOUS

## 2012-02-20 SURGICAL SUPPLY — 58 items
ADH SKN CLS APL DERMABOND .7 (GAUZE/BANDAGES/DRESSINGS) ×2
BAG DECANTER FOR FLEXI CONT (MISCELLANEOUS) ×3 IMPLANT
CANISTER SUCTION 2500CC (MISCELLANEOUS) ×3 IMPLANT
CATH CANNON HEMO 15F 50CM (CATHETERS) IMPLANT
CATH CANNON HEMO 15FR 19 (HEMODIALYSIS SUPPLIES) IMPLANT
CATH CANNON HEMO 15FR 23CM (HEMODIALYSIS SUPPLIES) ×2 IMPLANT
CATH CANNON HEMO 15FR 31CM (HEMODIALYSIS SUPPLIES) IMPLANT
CATH CANNON HEMO 15FR 32 (HEMODIALYSIS SUPPLIES) IMPLANT
CATH CANNON HEMO 15FR 32CM (HEMODIALYSIS SUPPLIES) IMPLANT
CHLORAPREP W/TINT 26ML (MISCELLANEOUS) ×3 IMPLANT
CLIP TI MEDIUM 6 (CLIP) ×3 IMPLANT
CLIP TI WIDE RED SMALL 6 (CLIP) ×3 IMPLANT
CLOTH BEACON ORANGE TIMEOUT ST (SAFETY) ×3 IMPLANT
COVER PROBE W GEL 5X96 (DRAPES) ×3 IMPLANT
COVER SURGICAL LIGHT HANDLE (MISCELLANEOUS) ×6 IMPLANT
DECANTER SPIKE VIAL GLASS SM (MISCELLANEOUS) ×3 IMPLANT
DERMABOND ADVANCED (GAUZE/BANDAGES/DRESSINGS) ×1
DERMABOND ADVANCED .7 DNX12 (GAUZE/BANDAGES/DRESSINGS) ×2 IMPLANT
DRAIN PENROSE 1/2X12 LTX STRL (WOUND CARE) IMPLANT
DRAPE C-ARM 42X72 X-RAY (DRAPES) ×3 IMPLANT
DRAPE CHEST BREAST 15X10 FENES (DRAPES) ×3 IMPLANT
DRSG TEGADERM 4X4.75 (GAUZE/BANDAGES/DRESSINGS) ×2 IMPLANT
ELECT REM PT RETURN 9FT ADLT (ELECTROSURGICAL) ×3
ELECTRODE REM PT RTRN 9FT ADLT (ELECTROSURGICAL) ×2 IMPLANT
GAUZE SPONGE 2X2 8PLY STRL LF (GAUZE/BANDAGES/DRESSINGS) ×2 IMPLANT
GAUZE SPONGE 4X4 16PLY XRAY LF (GAUZE/BANDAGES/DRESSINGS) ×3 IMPLANT
GLOVE BIO SURGEON STRL SZ7.5 (GLOVE) ×3 IMPLANT
GLOVE BIOGEL PI IND STRL 7.5 (GLOVE) ×2 IMPLANT
GLOVE BIOGEL PI INDICATOR 7.5 (GLOVE) ×1
GOWN STRL NON-REIN LRG LVL3 (GOWN DISPOSABLE) ×6 IMPLANT
KIT BASIN OR (CUSTOM PROCEDURE TRAY) ×3 IMPLANT
KIT ROOM TURNOVER OR (KITS) ×3 IMPLANT
NDL 18GX1X1/2 (RX/OR ONLY) (NEEDLE) ×1 IMPLANT
NDL HYPO 25GX1X1/2 BEV (NEEDLE) ×1 IMPLANT
NEEDLE 18GX1X1/2 (RX/OR ONLY) (NEEDLE) ×3 IMPLANT
NEEDLE 22X1 1/2 (OR ONLY) (NEEDLE) ×3 IMPLANT
NEEDLE HYPO 25GX1X1/2 BEV (NEEDLE) ×3 IMPLANT
NS IRRIG 1000ML POUR BTL (IV SOLUTION) ×3 IMPLANT
PACK CV ACCESS (CUSTOM PROCEDURE TRAY) ×3 IMPLANT
PACK SURGICAL SETUP 50X90 (CUSTOM PROCEDURE TRAY) ×3 IMPLANT
PAD ARMBOARD 7.5X6 YLW CONV (MISCELLANEOUS) ×6 IMPLANT
SPONGE GAUZE 2X2 STER 10/PKG (GAUZE/BANDAGES/DRESSINGS) ×1
SPONGE GAUZE 4X4 12PLY (GAUZE/BANDAGES/DRESSINGS) ×3 IMPLANT
SPONGE SURGIFOAM ABS GEL 100 (HEMOSTASIS) IMPLANT
SUT ETHILON 3 0 PS 1 (SUTURE) ×3 IMPLANT
SUT PROLENE 6 0 BV (SUTURE) ×3 IMPLANT
SUT VIC AB 3-0 SH 27 (SUTURE) ×3
SUT VIC AB 3-0 SH 27X BRD (SUTURE) ×2 IMPLANT
SUT VICRYL 4-0 PS2 18IN ABS (SUTURE) ×3 IMPLANT
SYR 20CC LL (SYRINGE) ×6 IMPLANT
SYR 30ML LL (SYRINGE) IMPLANT
SYR 5ML LL (SYRINGE) ×6 IMPLANT
SYR CONTROL 10ML LL (SYRINGE) ×3 IMPLANT
SYRINGE 10CC LL (SYRINGE) ×3 IMPLANT
TOWEL OR 17X24 6PK STRL BLUE (TOWEL DISPOSABLE) ×3 IMPLANT
TOWEL OR 17X26 10 PK STRL BLUE (TOWEL DISPOSABLE) ×3 IMPLANT
UNDERPAD 30X30 INCONTINENT (UNDERPADS AND DIAPERS) ×3 IMPLANT
WATER STERILE IRR 1000ML POUR (IV SOLUTION) ×3 IMPLANT

## 2012-02-20 NOTE — Progress Notes (Signed)
ANTIBIOTIC CONSULT NOTE - FOLLOW UP   Pharmacy Consult:  Vancomycin and Zosyn  Indication:  Fourniers gangrene, perirectal abscesses    Vital Signs: Temp: 97.3 F (36.3 C) (06/11 1039) BP: 115/81 mmHg (06/11 1130) Pulse Rate: 28  (06/11 1130)  Labs:  Basename 02/20/12 0500 02/19/12 1725 02/19/12 1718 02/19/12 0500 02/18/12 1303  WBC 12.1* 10.8* -- 16.2* --  HGB 5.2* 7.0* -- 7.0* --  PLT 116* 150 -- 151 --  LABCREA -- -- -- -- 51.60  CREATININE 2.16* -- 2.42* 3.02* --     Microbiology: Recent Results (from the past 720 hour(s))  CULTURE, BLOOD (ROUTINE X 2)     Status: Normal (Preliminary result)   Collection Time   02/17/12  8:50 AM      Component Value Range Status Comment   Specimen Description BLOOD RIGHT ARM   Final    Special Requests     Final    Value: BOTTLES DRAWN AEROBIC AND ANAEROBIC 2CC BOTH BOTTLES   Culture  Setup Time [id number]   Final    Culture     Final    Value:        BLOOD CULTURE RECEIVED NO GROWTH TO DATE CULTURE WILL BE HELD FOR 5 DAYS BEFORE ISSUING A FINAL NEGATIVE REPORT   Report Status PENDING   Incomplete   WOUND CULTURE     Status: Normal   Collection Time   02/17/12 12:00 PM      Component Value Range Status Comment   Specimen Description ABSCESS PERIRECTAL   Final    Special Requests NONE   Final    Gram Stain     Final    Value: NO WBC SEEN     NO SQUAMOUS EPITHELIAL CELLS SEEN     NO ORGANISMS SEEN   Culture NO GROWTH 2 DAYS   Final    Report Status 02/20/2012 FINAL   Final   ANAEROBIC CULTURE     Status: Normal (Preliminary result)   Collection Time   02/17/12 12:00 PM      Component Value Range Status Comment   Specimen Description ABSCESS PERIRECTAL   Final    Special Requests NONE   Final    Gram Stain     Final    Value: FEW WBC PRESENT,BOTH PMN AND MONONUCLEAR     NO SQUAMOUS EPITHELIAL CELLS SEEN     NO ORGANISMS SEEN   Culture     Final    Value: NO ANAEROBES ISOLATED; CULTURE IN PROGRESS FOR 5 DAYS   Report Status  PENDING   Incomplete   MRSA PCR SCREENING     Status: Normal   Collection Time   02/17/12  1:57 PM      Component Value Range Status Comment   MRSA by PCR NEGATIVE  NEGATIVE  Final   URINE CULTURE     Status: Normal   Collection Time   02/17/12  2:55 PM      Component Value Range Status Comment   Specimen Description URINE, CATHETERIZED   Final    Special Requests Immunocompromised   Final    Culture  Setup Time [id number]   Final    Colony Count NO GROWTH   Final    Culture NO GROWTH   Final    Report Status 02/18/2012 FINAL   Final   URINE CULTURE     Status: Normal   Collection Time   02/17/12  5:28 PM      Component Value  Range Status Comment   Specimen Description URINE, CATHETERIZED   Final    Special Requests NONE   Final    Culture  Setup Time [id number]   Final    Colony Count NO GROWTH   Final    Culture NO GROWTH   Final    Report Status 02/18/2012 FINAL   Final   CULTURE, BLOOD (ROUTINE X 2)     Status: Normal (Preliminary result)   Collection Time   02/17/12  6:13 PM      Component Value Range Status Comment   Specimen Description BLOOD RIGHT ARM   Final    Special Requests BOTTLES DRAWN AEROBIC AND ANAEROBIC 10CC   Final    Culture  Setup Time [id number]   Final    Culture     Final    Value:        BLOOD CULTURE RECEIVED NO GROWTH TO DATE CULTURE WILL BE HELD FOR 5 DAYS BEFORE ISSUING A FINAL NEGATIVE REPORT   Report Status PENDING   Incomplete   CULTURE, BLOOD (ROUTINE X 2)     Status: Normal (Preliminary result)   Collection Time   02/17/12  6:30 PM      Component Value Range Status Comment   Specimen Description BLOOD LEFT HEMODIALYSIS CATHETER   Final    Special Requests BOTTLES DRAWN AEROBIC AND ANAEROBIC 10CC   Final    Culture  Setup Time [id number]   Final    Culture     Final    Value:        BLOOD CULTURE RECEIVED NO GROWTH TO DATE CULTURE WILL BE HELD FOR 5 DAYS BEFORE ISSUING A FINAL NEGATIVE REPORT   Report Status PENDING   Incomplete        Assessment: 47 YOM with Fournier's gangrene and peroneal / perirectal abscesses s/p I&D to continue on vancomycin, Zosyn, and clindamycin.  Patient was on CRRT from 6/8 until this AM, s/p HD catheter placement, and to start IHD.  Patient also with HIV disease on Truvada and raltegravir PTA, but meds have been adjusted due to NPO status.    Blood 6/8>> NGTD Abscess 6/8>> NGTD Urine 6/8>> negative MRSA pcr 6/8 Negative   Goal of Therapy Vanc pre-HD level: 15-25 mcg/mL   Plan:  - Check pre-HD vanc level (hold vanc for now as uncertain of HD schedule, ?first session tomorrow) - Change Zosyn to 2.25gm IV Q8H - Monitor cultures, HD schedule, vanc level when indicated     Gerrit Rafalski D. Laney Potash, PharmD, BCPS Pager:  [phone] 02/20/2012, 1:52 PM

## 2012-02-20 NOTE — Transfer of Care (Signed)
Immediate Anesthesia Transfer of Care Note  Patient: Bryan Gilmore  Procedure(s) Performed: Procedure(s) (LRB): INSERTION OF DIALYSIS CATHETER (N/A)  Patient Location: PACU  Anesthesia Type: MAC  Level of Consciousness: awake, alert  and oriented  Airway & Oxygen Therapy: Patient Spontanous Breathing and Patient connected to nasal cannula oxygen  Post-op Assessment: Report given to PACU RN, Post -op Vital signs reviewed and stable and Patient moving all extremities, PRBC's still infusin 100 ml/hr.  Post vital signs: Reviewed and stable  Complications: No apparent anesthesia complications

## 2012-02-20 NOTE — Preoperative (Signed)
Beta Blockers   Reason not to administer Beta Blockers:Not Applicable 

## 2012-02-20 NOTE — H&P (Addendum)
  [facility]  Asked to place left AVF/ AVG and Diatek. Vein map has not been done. He has 2 IV's in left arm. He was hypoglycemic today and is receiving PRB's.   I plan on placing Diatek catheter. Will then remove IV's from left arm and look at veins. If looks like good candidate for AVF will potentially proceed with this. If veins marginal, would be reluctant to proceed with AVG until infection all cleared and pt more stable from medical standpoint. Procedure and risks discussed with patient.  Di Kindle. Edilia Bo, MD, FACS Beeper [phone] 02/20/2012

## 2012-02-20 NOTE — Anesthesia Preprocedure Evaluation (Signed)
Anesthesia Evaluation  Patient identified by MRN, date of birth, ID band Patient awake    Reviewed: Allergy & Precautions, H&P , NPO status , Patient's Chart, lab work & pertinent test results  Airway Mallampati: I TM Distance: >3 FB Neck ROM: Full    Dental   Pulmonary    Pulmonary exam normal       Cardiovascular hypertension,     Neuro/Psych    GI/Hepatic (+) Hepatitis -  Endo/Other    Renal/GU ESRF and DialysisRenal disease     Musculoskeletal   Abdominal   Peds  Hematology   Anesthesia Other Findings   Reproductive/Obstetrics                           Anesthesia Physical Anesthesia Plan  ASA: III  Anesthesia Plan: MAC   Post-op Pain Management:    Induction: Intravenous  Airway Management Planned: Simple Face Mask  Additional Equipment:   Intra-op Plan:   Post-operative Plan:   Informed Consent: I have reviewed the patients History and Physical, chart, labs and discussed the procedure including the risks, benefits and alternatives for the proposed anesthesia with the patient or authorized representative who has indicated his/her understanding and acceptance.     Plan Discussed with: CRNA and Surgeon  Anesthesia Plan Comments:         Anesthesia Quick Evaluation

## 2012-02-20 NOTE — Progress Notes (Signed)
Patient ID: Bryan Gilmore, male   DOB: 05/14/1951, 61 y.o.   MRN: [id number]   [facility] Progress Note    Subjective:   The patient voices no emerging complaints and states that he feels better-he appears more awake/alert as compared to yesterday. CR RT tolerated without any problems.    Objective:   BP 108/73  Pulse 61  Temp(Src) 98.6 F (37 C) (Oral)  Resp 12  Ht 5\' 11"  (1.803 m)  Wt 108.3 kg (238 lb 12.1 oz)  BMI 33.30 kg/m2  SpO2 99%  Intake/Output Summary (Last 24 hours) at 02/20/12 0717 Last data filed at 02/20/12 0700  Gross per 24 hour  Intake  472.5 ml  Output    733 ml  Net -260.5 ml   Weight change: 0.8 kg (1 lb 12.2 oz)  Physical Exam: Gen: Comfortably sleeping in bed, easy to awaken and engages in conversation CVS: Pulse regular in rate and rhythm, heart sounds S1 and S2 with an ejection systolic murmur Resp: Clear to auscultation bilaterally, no rales/rhonchi Abd: Soft, flat, nontender and bowel sounds are normal Ext: No lower extremity edema  Imaging: US Renal Port  02/18/2012  *RADIOLOGY REPORT*  Clinical Data: Acute renal failure.  History of hypertension and HIV infection.  Question hydronephrosis.  RENAL/URINARY TRACT ULTRASOUND COMPLETE - PORTABLE  Comparison:  Abdominal CT 02/17/2012.  Abdominal ultrasound 07/13/2010.  Findings:  Right Kidney:  There is cortical thinning with diffusely increased echogenicity.  No hydronephrosis or focal renal abnormality is identified.  Renal length is 8.3 cm.  Left Kidney:  There is cortical thinning with diffusely increased echogenicity.  No hydronephrosis is present.  There is a small cyst in the interpolar region measuring 12 mm maximally.  Renal length is 9.7 cm.  Bladder:  Decompressed by Foley catheter.  IMPRESSION:  1.  Stable renal ultrasound with bilaterally increased echogenicity consistent with chronic medical renal disease. 2.  No hydronephrosis.  Original Report Authenticated By: Gerrianne Scale, M.D.   Dg Chest Portable 1 View  02/19/2012  *RADIOLOGY REPORT*  Clinical Data: Hemoptysis.  PORTABLE CHEST - 1 VIEW  Comparison: Chest x-ray 02/18/2012.  Findings: There is a left-sided internal jugular central venous catheter with tip terminating in the near the confluence of the left innominate and superior vena cava. Lung volumes are low.  No consolidative airspace disease.  No definite pleural effusions. Pulmonary venous congestion without frank pulmonary edema.  Mild enlargement of the cardiopericardial silhouette, increased compared to prior examination. The patient is rotated to the right on today's exam, resulting in distortion of the mediastinal contours and reduced diagnostic sensitivity and specificity for mediastinal pathology.  Atherosclerotic calcifications within the arch of the aorta.  IMPRESSION: 1.  Support apparatus, as above. 2.  Low lung volumes with increasing enlargement of the cardiopericardial silhouette (which may reflect increasing cardiac dilatation and/or the presence of pericardial effusion) and pulmonary venous congestion (without frank pulmonary edema). 3.  Atherosclerosis.  Original Report Authenticated By: Florencia Reasons, M.D.   Dg Chest Portable 1 View  02/18/2012  *RADIOLOGY REPORT*  Clinical Data: Follow up hemoptysis.  PORTABLE CHEST - 1 VIEW  Comparison: 02/18/2012  Findings: Left jugular central line tip is near the upper SVC. Heart size is upper limits of normal.  Few densities at the left lung base are suggestive for atelectasis.  Remainder of the lungs are clear.  No evidence for a pneumothorax.  Trachea is midline.  IMPRESSION:  Left basilar atelectasis.  Stable  position of the central line.  Original Report Authenticated By: Richarda Overlie, M.D.    Labs: BMET  Lab 02/20/12 0500 02/19/12 1718 02/19/12 0500 02/18/12 1858 02/18/12 0500 02/17/12 1830 02/17/12 1230  NA 136 136 136 134* 135 132* 128*  K 4.2 4.0 4.1 4.1 4.0 5.3* 6.1*  CL 101 99 97 89* 92*  99 97  CO2 28 28 30 31 25  12* 9*  GLUCOSE 114* 105* 98 101* 96 101* 111*  BUN 23 29* 44* 64* 98* 121* 116*  CREATININE 2.16* 2.42* 3.02* 4.05* 5.95* 8.51* 8.62*  ALB -- -- -- -- -- -- --  CALCIUM 7.9* 8.2* 8.4 7.5* 7.8* 8.0* 8.5  PHOS 3.1 3.5 4.4 5.2* 6.6*6.6* 9.5* --   CBC  Lab 02/20/12 0500 02/19/12 1725 02/19/12 0500 02/18/12 1800 02/17/12 1830 02/17/12 0616  WBC 12.1* 10.8* 16.2* 19.3* -- --  NEUTROABS -- -- -- -- 25.8* 25.5*  HGB 5.2* 7.0* 7.0* 6.6* -- --  HCT 15.3* 20.3* 19.0* 18.0* -- --  MCV 84.5 83.9 81.2 79.6 -- --  PLT 116* 150 151 164 -- --    Medications:      . clindamycin (CLEOCIN) IV  900 mg Intravenous Q8H  . lamiVUDine  150 mg Oral Daily  . lopinavir-ritonavir  5 mL Oral BID WC  . pantoprazole (PROTONIX) IV  40 mg Intravenous QHS  . paricalcitol  1 mcg Oral Daily  . piperacillin-tazobactam  3.375 g Intravenous Q6H  . vancomycin  1,000 mg Intravenous Q24H  . Zidovudine  300 mg Oral Q12H     Assessment/ Plan:   1. Acute on chronic renal failure IV- (baseline creat 4.5 earlier this year when seen by Dr. Briant Cedar in clinic prior to referral for permanent dialysis access). It appears that he has likely evolved into end-stage renal disease will require chronic dialysis. Given the correction of his metabolic acidosis and improvement of his mental status, will go ahead and discontinue CRRT today and evaluate daily for intermittent dialysis needs. He has been seen by vascular surgery for conversion of temporary 2 tunneled dialysis catheter and evaluation/placement of permanent dialysis access. Outpatient notes indicate that he favors in center hemodialysis. 2. Anemia- slow ooze continues from recent surgical sites of perineal areas there are anatomically difficult to dress. Ongoing packed red cell transfusion. We'll start Aranesp 3. Fournier's gangrene- s/p I&D, on IV abx-leukocytosis persists but patient is afebrile and without any obvious features of sepsis. 4. Hep  C/HIV- on ARVs per primary service 5. AMS - improved from a combination of dialysis and treatment of gangrene/acidosis. Plan to discontinue CRRT today.  Zetta Bills, MD 02/20/2012, 7:17 AM

## 2012-02-20 NOTE — Progress Notes (Signed)
eLink Physician-Brief Progress Note Patient Name: Bryan Gilmore DOB: 07/02/1951 MRN: [id number]  Date of Service  02/20/2012   HPI/Events of Note   Hb 6.2  eICU Interventions  1 u prbc tx   Intervention Category Intermediate Interventions: Bleeding - evaluation and treatment with blood products  Cap Massi V. 02/20/2012, 6:44 PM

## 2012-02-20 NOTE — Anesthesia Postprocedure Evaluation (Signed)
  Anesthesia Post-op Note  Patient: Bryan Gilmore  Procedure(s) Performed: Procedure(s) (LRB): INSERTION OF DIALYSIS CATHETER (N/A)  Patient Location: PACU  Anesthesia Type: MAC  Level of Consciousness: awake  Airway and Oxygen Therapy: Patient Spontanous Breathing  Post-op Pain: none  Post-op Assessment: Post-op Vital signs reviewed, Patient's Cardiovascular Status Stable, Respiratory Function Stable, Patent Airway, No signs of Nausea or vomiting and Pain level controlled  Post-op Vital Signs: stable  Complications: No apparent anesthesia complications

## 2012-02-20 NOTE — Progress Notes (Signed)
Name: Bryan Gilmore MRN: [id number] DOB: 11-15-1950    LOS: 3  Referring Provider:  Urology Reason for Referral:  Respiratory failure and acidosis.  PULMONARY / CRITICAL CARE MEDICINE  HPI:  61 year old male with HIV PMH presented to [facility] with 1 wk history of testicular discomfort and swelling, dysuria and difficulty urinating. In ER was noted to have Fournier gangrene and perineal abscesses and was taken to the OR for surgical correction 6/8.  Patient was also noted to have evolving renal failure and profound acidosis.  Renal was consulted and recommended CVVH and pt was tx to [facility] under PCCM.   Subjective/Overnight: ddavp for hemoptysis 6/10 hemoptysis resolved 6/11- perm cath placed  Vital Signs: Temp:  [97.2 F (36.2 C)-98.7 F (37.1 C)] 97.3 F (36.3 C) (06/11 1039) Pulse Rate:  [25-101] 28  (06/11 1130) Resp:  [11-21] 21  (06/11 1130) BP: (90-134)/(51-95) 115/81 mmHg (06/11 1130) SpO2:  [91 %-100 %] 100 % (06/11 1130) FiO2 (%):  [3 %] 3 % (06/10 1600) Weight:  [108.3 kg (238 lb 12.1 oz)] 108.3 kg (238 lb 12.1 oz) (06/11 0500)  Physical Examination: General: chronically ill appearing male, NAD  Neuro: more awake, alert  CV: s1s2 rrr PULM: resps even non labored GI: abd soft, +bs Extremities: warm and dry, no edema    Principal Problem:  *Sepsis secondary to suspected Fornier's gangrene Active Problems:  HIV INFECTION  ARF (acute renal failure) in the setting of stage III CKD  Metabolic acidosis  Hyperkalemia  Normocytic anemia  Hyponatremia  Acute respiratory failure   ASSESSMENT AND PLAN  PULMONARY  Lab 02/18/12 2007 02/18/12 0525 02/17/12 1757 02/17/12 1242  PHART 7.551* -- 7.298* 7.129*  PCO2ART 36.4 -- 26.1* 24.9*  PO2ART 152.0* -- 216.0* 195.0*  HCO3 32.1* 27.0* 12.7* 7.9*  O2SAT 100.0 71.0 100.0 98.4   Ventilator Settings:  CXR:  6/9>> int prom  ETT:  none  A:  Acute respiratory distress - respiratory compensation in setting metabolic  acidosis Hemoptysis resolved 6/10 P:   ddavp improved hemoptysis, no role FOB Reduce O2 needs -upright position, IS  CARDIOVASCULAR  Lab 02/17/12 1830 02/17/12 0651  TROPONINI -- --  LATICACIDVEN 1.1 1.7  PROBNP 42513.0* --   ECG:  NSR Lines:  L IJ HD cath 6/8>>> Rt subclav perm cath  6/11>>>  A: SIRS - likely r/t suspected Fournier's gangrene with peroneal and perirectal abscesses.  BP ok.  Lactate not impressive.  P:  Remains nomrmotensive Tele Rate controlled fib  RENAL  Lab 02/20/12 0500 02/19/12 1718 02/19/12 0500 02/18/12 1858 02/18/12 0500 02/17/12 1830  NA 136 136 136 134* 135 --  K 4.2 4.0 -- -- -- --  CL 101 99 97 89* 92* --  CO2 28 28 30 31 25  --  BUN 23 29* 44* 64* 98* --  CREATININE 2.16* 2.42* 3.02* 4.05* 5.95* --  CALCIUM 7.9* 8.2* 8.4 7.5* 7.8* --  MG 2.4 -- 2.4 -- 2.1 2.2  PHOS 3.1 3.5 4.4 5.2* 6.6*6.6* --   Intake/Output      06/10 0701 - 06/11 0700 06/11 0701 - 06/12 0700   I.V. (mL/kg)  200 (1.8)   Blood 12.5 115   IV Piggyback 510 250   Total Intake(mL/kg) 522.5 (4.8) 565 (5.2)   Urine (mL/kg/hr) 80 (0) 20 (0)   Other 653    Total Output 733 20   Net -210.5 +545         Foley:  6/8  A:  Acute on chronic renal failure - ?progression of underlying disease (prob stage IV CKD with baseline SCr 2.47-2.78) c/b ATN in setting SIRS, volume depletion, ?drug toxicity.  Metabolic acidosis  P:   Now off cvvhd, per renal further treatments F/u chem in am , follow output if any noted Perm cath placed  GASTROINTESTINAL  Lab 02/20/12 0500 02/19/12 1718 02/19/12 0500 02/18/12 1858 02/18/12 0500 02/17/12 1830 02/17/12 0850  AST -- -- -- -- -- 23 22  ALT -- -- -- -- -- 10 12  ALKPHOS -- -- -- -- -- 197* 105  BILITOT -- -- -- -- -- 0.8 0.6  PROT -- -- -- -- -- 6.3 7.5  ALBUMIN 1.5* 1.8* 1.8* 1.8* 1.8* -- --    A:  No acute issues  P:   Full liq, advance ppi  HEMATOLOGIC  Lab 02/20/12 0500 02/19/12 1725 02/19/12 0500 02/18/12 1800 02/18/12  0500 02/17/12 1830  HGB 5.2* 7.0* 7.0* 6.6* 7.2* --  HCT 15.3* 20.3* 19.0* 18.0* 20.0* --  PLT 116* 150 151 164 184 --  INR -- -- -- -- 1.40 1.53*  APTT 33 -- 33 -- 32 33   A:  Anemia - ?r/t hemoptysis.  Coags wnl.  P:  Some leakage blood from wound source? likely For 2 unit prbc Cbc in am and this pm  INFECTIOUS  Lab 02/20/12 0500 02/19/12 1725 02/19/12 0500 02/18/12 1800 02/18/12 0500 02/17/12 1830 02/17/12 0620  WBC 12.1* 10.8* 16.2* 19.3* 21.7* -- --  PROCALCITON -- -- -- -- -- 13.97 10.04   Cultures: BCx2 6/8>>> Urine 6/8>>> Wound (perirectal abscess) 6/8>>>  Antibiotics: Zosyn 6/8>>> Vancomycin 6/8>>> clinda 6/9>>>  A:  Fournier's gangrene - s/p I/D 6/8.      HIV - last CD4 =440 04/17/11 P:   Repeat CD4 pending  Cont HIV rx  No further clinical shock ( tox prod(, dc clinda  ENDOCRINE  A:   No acute issues , low at times P:   Monitor blood sugars on bmet  Re advance diet, cbg for low glu x 1  NEUROLOGIC  A:  AMS - in setting SIRS and acute on chronic renal failure.  Somewhat improved.  P:   Supportive care  PT as now off cvvhd  BEST PRACTICE / DISPOSITION Level of Care:  ICU, consider transfer to sdu Primary Service:  PCCM Consultants:  Renal, ID, urology  Code Status:  full Diet:  NPO DVT Px:  SCD's GI Px:  protonix Skin Integrity:  Peroneal wounds/ surgical incisions  Social / Family:  No family available    Mcarthur Rossetti. Tyson Alias, MD, FACP Pgr: [phone] [facility]

## 2012-02-20 NOTE — Progress Notes (Signed)
The patient is stable and ready to transfer to 3300. I have called report to [place], RN on 3300. The patient's belongings are with him. I will continue to monitor.

## 2012-02-20 NOTE — Op Note (Signed)
02/20/2012  PREOP DIAGNOSIS: Chronic kidney disease  POSTOP DIAGNOSIS: Chronic kidney disease  PROCEDURE: Ultrasound guided placement of right IJ 23 cm Diatek catheter  SURGEON: Di Kindle. Edilia Bo, MD, FACS  ASSIST: none  ANESTHESIA: local with sedation   EBL: minimal  FINDINGS: patent right IJ  TECHNIQUE: The patient was taken to the operating room and sedated by anesthesia. The neck and upper chest were prepped and draped in the usual sterile fashion. After the skin was anesthetized with 1% lidocaine, and under ultrasound guidance, the right IJ was cannulated and a guidewire introduced into the superior vena cava under fluoroscopic control. The tract over the wire was dilated and then the dilator and peel-away sheath were passed over the wire and the wire and dilator removed. The catheter was passed through the peel-away sheath and positioned in the right atrium. The exit site for the catheter was selected and the skin anesthetized between the 2 areas. The catheter was then brought through the tunnel, cut to the appropriate length, and the distal ports were attached. Both ports withdrew easily, were then flushed with heparinized saline and filled with concentrated heparin. The catheter was secured at its exit site with a 3-0 nylon suture. The IJ cannulation site was closed with a 4-0 subcuticular stitch. A sterile dressing was applied. The patient tolerated the procedure well and was transferred to the recovery room in stable condition. All needle and sponge counts were correct.  Waverly Ferrari, MD, FACS [facility]  DATE OF OPERATION: 02/20/2012 DATE OF DICTATION: 02/20/2012

## 2012-02-20 NOTE — Progress Notes (Signed)
VASCULAR SURGERY  This patient was scheduled for possible placement of a left arm AV fistula and placement of a Diatek catheter. He was hypoglycemic this morning and difficult to arouse but responded to glucose. He is also receiving 2 units of packed red blood cells. I placed the Diatek catheter the right IJ without difficulty. I removed both IVs from his left arm. By my interrogation with the ultrasound scanner, the forearm cephalic vein and  upper arm cephalic vein were quite small. The basilic vein looks somewhat sclerotic. Given his history,  in that he presented with Fournier's gangrene, I would be reluctant to place a graft until he is doing better from a medical standpoint.  I Will order a vein map of both upper extremities and tentatively plan I spent an AV fistula or AV graft Thursday or Friday of this week if he is doing better from a medical standpoint.  Di Kindle. Edilia Bo, MD, FACS Beeper [phone] 02/20/2012

## 2012-02-21 ENCOUNTER — Inpatient Hospital Stay (HOSPITAL_COMMUNITY): Payer: BC Managed Care – PPO

## 2012-02-21 DIAGNOSIS — J9600 Acute respiratory failure, unspecified whether with hypoxia or hypercapnia: Secondary | ICD-10-CM | POA: Diagnosis not present

## 2012-02-21 DIAGNOSIS — N179 Acute kidney failure, unspecified: Secondary | ICD-10-CM | POA: Diagnosis not present

## 2012-02-21 DIAGNOSIS — B20 Human immunodeficiency virus [HIV] disease: Secondary | ICD-10-CM | POA: Diagnosis not present

## 2012-02-21 DIAGNOSIS — N19 Unspecified kidney failure: Secondary | ICD-10-CM

## 2012-02-21 DIAGNOSIS — N501 Vascular disorders of male genital organs: Secondary | ICD-10-CM | POA: Diagnosis not present

## 2012-02-21 LAB — CBC
HCT: 17 % — ABNORMAL LOW (ref 39.0–52.0)
HCT: 15.4 % — ABNORMAL LOW (ref 39.0–52.0)
Hemoglobin: 5.5 g/dL — CL (ref 13.0–17.0)
Hemoglobin: 8.3 g/dL — ABNORMAL LOW (ref 13.0–17.0)
MCH: 29.1 pg (ref 26.0–34.0)
MCH: 28.9 pg (ref 26.0–34.0)
MCHC: 35.7 g/dL (ref 30.0–36.0)
MCV: 79.8 fL (ref 78.0–100.0)
MCV: 81.5 fL (ref 78.0–100.0)
MCV: 82.9 fL (ref 78.0–100.0)
Platelets: 91 10*3/uL — ABNORMAL LOW (ref 150–400)
RBC: 2.13 MIL/uL — ABNORMAL LOW (ref 4.22–5.81)
RBC: 2.87 MIL/uL — ABNORMAL LOW (ref 4.22–5.81)
WBC: 16.5 10*3/uL — ABNORMAL HIGH (ref 4.0–10.5)

## 2012-02-21 LAB — GLUCOSE, CAPILLARY: Glucose-Capillary: 130 mg/dL — ABNORMAL HIGH (ref 70–99)

## 2012-02-21 LAB — TYPE AND SCREEN
Antibody Screen: NEGATIVE
Unit division: 0
Unit division: 0

## 2012-02-21 LAB — RENAL FUNCTION PANEL
BUN: 35 mg/dL — ABNORMAL HIGH (ref 6–23)
Calcium: 7.8 mg/dL — ABNORMAL LOW (ref 8.4–10.5)
Creatinine, Ser: 4.55 mg/dL — ABNORMAL HIGH (ref 0.50–1.35)
Glucose, Bld: 105 mg/dL — ABNORMAL HIGH (ref 70–99)
Phosphorus: 5.6 mg/dL — ABNORMAL HIGH (ref 2.3–4.6)
Potassium: 5 mEq/L (ref 3.5–5.1)

## 2012-02-21 LAB — PROTIME-INR: INR: 1.37 (ref 0.00–1.49)

## 2012-02-21 LAB — ANAEROBIC CULTURE

## 2012-02-21 MED ORDER — NEPRO/CARBSTEADY PO LIQD: 237.0000 mL | ORAL | Status: DC | PRN

## 2012-02-21 MED ORDER — HEPARIN SODIUM (PORCINE) 1000 UNIT/ML DIALYSIS: 1000.0000 [IU] | INTRAMUSCULAR | Status: DC | PRN

## 2012-02-21 MED ORDER — ALTEPLASE 2 MG IJ SOLR: 2.0000 mg | Freq: Once | INTRAMUSCULAR | Status: AC | PRN

## 2012-02-21 MED ORDER — SODIUM CHLORIDE 0.9 % IV SOLN: 100.0000 mL | INTRAVENOUS | Status: DC | PRN

## 2012-02-21 MED ORDER — PENTAFLUOROPROP-TETRAFLUOROETH EX AERO: 1.0000 "application " | INHALATION_SPRAY | CUTANEOUS | Status: DC | PRN

## 2012-02-21 MED ORDER — VANCOMYCIN HCL IN DEXTROSE 1-5 GM/200ML-% IV SOLN
1000.0000 mg | INTRAVENOUS | Status: DC
  Administered 2012-02-21: 1000 mg via INTRAVENOUS
  Filled 2012-02-21 (×2): qty 200

## 2012-02-21 MED ORDER — LIDOCAINE-PRILOCAINE 2.5-2.5 % EX CREA: 1.0000 "application " | TOPICAL_CREAM | CUTANEOUS | Status: DC | PRN

## 2012-02-21 MED ORDER — LIDOCAINE HCL (PF) 1 % IJ SOLN: 5.0000 mL | INTRAMUSCULAR | Status: DC | PRN

## 2012-02-21 NOTE — Progress Notes (Signed)
ANTIBIOTIC CONSULT NOTE - FOLLOW UP   Pharmacy Consult:  Vancomycin and Zosyn  Indication:  Fourniers gangrene, perirectal abscesses   Pre-HD Level 02/21/2012 14:58  Vancomycin Rm 22.7   Assessment: 61 YOM with Fournier's gangrene and peroneal / perirectal abscesses s/p I&D to continue on vancomycin, Zosyn, and clindamycin.  Patient was on CRRT from 6/8 until this AM, s/p HD catheter placement, and to start IHD.  Patient also with HIV disease on Truvada and raltegravir PTA, but meds have been adjusted due to NPO status.  His random Vancomycin level is within the desired range for pre-HD level (15-25 mcg/ml).  He is clearing the drug as expected.  Blood 6/8>> NGTD Abscess 6/8>> NGTD Urine 6/8>> negative MRSA pcr 6/8 Negative  Plan: - Begin IV Vancomycin 1 gm with each HD.  - Change Zosyn to 2.25gm IV Q8H - Monitor cultures, HD schedule, vanc level when indicated  Vital Signs: Temp: 98.2 F (36.8 C) (06/12 1630) Temp src: Oral (06/12 1630) BP: 113/76 mmHg (06/12 1645) Pulse Rate: 76  (06/12 1645)  Labs:  Basename 02/21/12 1458 02/21/12 0440 02/20/12 1755 02/20/12 0500 02/19/12 1718  WBC 17.8* 16.5* 9.7 -- --  HGB 5.5* 6.1* 6.2* -- --  PLT 97* 94* 99* -- --  LABCREA -- -- -- -- --  CREATININE -- -- 3.09* 2.16* 2.42*   Microbiology: Recent Results (from the past 720 hour(s))  CULTURE, BLOOD (ROUTINE X 2)     Status: Normal (Preliminary result)   Collection Time   02/17/12  8:50 AM      Component Value Range Status Comment   Specimen Description BLOOD RIGHT ARM   Final    Special Requests     Final    Value: BOTTLES DRAWN AEROBIC AND ANAEROBIC 2CC BOTH BOTTLES   Culture  Setup Time [id number]   Final    Culture     Final    Value:        BLOOD CULTURE RECEIVED NO GROWTH TO DATE CULTURE WILL BE HELD FOR 5 DAYS BEFORE ISSUING A FINAL NEGATIVE REPORT   Report Status PENDING   Incomplete   WOUND CULTURE     Status: Normal   Collection Time   02/17/12 12:00 PM      Component  Value Range Status Comment   Specimen Description ABSCESS PERIRECTAL   Final    Special Requests NONE   Final    Gram Stain     Final    Value: NO WBC SEEN     NO SQUAMOUS EPITHELIAL CELLS SEEN     NO ORGANISMS SEEN   Culture NO GROWTH 2 DAYS   Final    Report Status 02/20/2012 FINAL   Final   ANAEROBIC CULTURE     Status: Normal   Collection Time   02/17/12 12:00 PM      Component Value Range Status Comment   Specimen Description ABSCESS PERIRECTAL   Final    Special Requests NONE   Final    Gram Stain     Final    Value: FEW WBC PRESENT,BOTH PMN AND MONONUCLEAR     NO SQUAMOUS EPITHELIAL CELLS SEEN     NO ORGANISMS SEEN   Culture NO ANAEROBES ISOLATED   Final    Report Status 02/21/2012 FINAL   Final   MRSA PCR SCREENING     Status: Normal   Collection Time   02/17/12  1:57 PM      Component Value Range Status Comment  MRSA by PCR NEGATIVE  NEGATIVE Final   URINE CULTURE     Status: Normal   Collection Time   02/17/12  2:55 PM      Component Value Range Status Comment   Specimen Description URINE, CATHETERIZED   Final    Special Requests Immunocompromised   Final    Culture  Setup Time [id number]   Final    Colony Count NO GROWTH   Final    Culture NO GROWTH   Final    Report Status 02/18/2012 FINAL   Final   URINE CULTURE     Status: Normal   Collection Time   02/17/12  5:28 PM      Component Value Range Status Comment   Specimen Description URINE, CATHETERIZED   Final    Special Requests NONE   Final    Culture  Setup Time [id number]   Final    Colony Count NO GROWTH   Final    Culture NO GROWTH   Final    Report Status 02/18/2012 FINAL   Final   CULTURE, BLOOD (ROUTINE X 2)     Status: Normal (Preliminary result)   Collection Time   02/17/12  6:13 PM      Component Value Range Status Comment   Specimen Description BLOOD RIGHT ARM   Final    Special Requests BOTTLES DRAWN AEROBIC AND ANAEROBIC 10CC   Final    Culture  Setup Time [id number]   Final    Culture      Final    Value:        BLOOD CULTURE RECEIVED NO GROWTH TO DATE CULTURE WILL BE HELD FOR 5 DAYS BEFORE ISSUING A FINAL NEGATIVE REPORT   Report Status PENDING   Incomplete   CULTURE, BLOOD (ROUTINE X 2)     Status: Normal (Preliminary result)   Collection Time   02/17/12  6:30 PM      Component Value Range Status Comment   Specimen Description BLOOD LEFT HEMODIALYSIS CATHETER   Final    Special Requests BOTTLES DRAWN AEROBIC AND ANAEROBIC 10CC   Final    Culture  Setup Time [id number]   Final    Culture     Final    Value:        BLOOD CULTURE RECEIVED NO GROWTH TO DATE CULTURE WILL BE HELD FOR 5 DAYS BEFORE ISSUING A FINAL NEGATIVE REPORT   Report Status PENDING   Incomplete     Nadara Mustard, PharmD., MS Clinical Pharmacist Pager:  [phone]  Thank you for allowing pharmacy to be part of this patients care team. 02/21/2012, 5:06 PM

## 2012-02-21 NOTE — Progress Notes (Signed)
Report called to brittney rn pt going to 2103 via bed after dialysis for closer monitoring due to low hgb and active bleeding from scrotal wound. Bryan Gilmore

## 2012-02-21 NOTE — Procedures (Signed)
Patient seen on Hemodialysis. QB 350, UF goal1.5L Awaiting PRBCs to be transfused at dialysis. Treatment adjusted as needed.  Zetta Bills MD [facility]. Office # [phone] Pager # [phone] 3:07 PM

## 2012-02-21 NOTE — Progress Notes (Signed)
CRITICAL VALUE ALERT  Critical value received:  hgb 5.5  Date of notification:  02/21/2012  Time of notification:  1500  Critical value read back:yes  Nurse who received alert:  Julien Girt rn   MD notified (1st page):  Dr wright/dr patel  Time of first page:  1500  MD notified (2nd page):  Time of second page:  Responding MD:  Dr patel/ dr Delford Field  Time MD responded:  [phone]

## 2012-02-21 NOTE — Progress Notes (Signed)
*  Preliminary Results*  Right  Upper Extremity Vein Map    Cephalic Unable to visualize.  Segment Diameter Depth Comment  1. Axilla mm mm   2. Mid upper arm mm mm   3. Above AC mm mm   4. In AC mm mm   5. Below AC mm mm   6. Mid forearm mm mm   7. Wrist mm mm    mm mm    mm mm    mm mm    Basilic  Segment Diameter Depth Comment  1. Axilla 2.70mm 16.5mm   2. Mid upper arm 2mm 15.61mm   3. Above AC 2.66mm 18.26mm   4. In [facility] 2.40mm 14.69mm   5. Below AC 2mm 10.29mm   6. Mid forearm mm mm   7. Wrist mm mm    mm mm    mm mm    mm mm     Left Upper Extremity Vein Map    Cephalic  Segment Diameter Depth Comment  1. Axilla 0.42mm mm   2. Mid upper arm 0.60mm mm   3. Above AC 1.31mm mm   4. In AC mm mm   5. Below AC mm mm   6. Mid forearm mm mm   7. Wrist mm mm    mm mm    mm mm    mm mm    Basilic  Segment Diameter Depth Comment  1. Axilla mm mm   2. Mid upper arm 2.36mm 14.1mm   3. Above AC 2.58mm 10.30mm   4. In [facility] 2.38mm 4.90mm   5. Below AC 1.60mm 2mm Thrombosis  6. Mid forearm 1.38mm 1.30mm Thrombosis  7. 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 Unable to visualize right cephalic vein.  02/21/2012 1:15 PM Elpidio Galea, RDMS, RDCS

## 2012-02-21 NOTE — Progress Notes (Signed)
Name: Bryan Gilmore MRN: [id number] DOB: Sep 28, 1950    LOS: 4  Referring Provider:  Urology Reason for Referral:  Respiratory failure and acidosis.  PULMONARY / CRITICAL CARE MEDICINE  HPI:  61 year old male with HIV PMH presented to [facility] with 1 wk history of testicular discomfort and swelling, dysuria and difficulty urinating. In ER was noted to have Fournier gangrene and perineal abscesses and was taken to the OR for surgical correction 6/8.  Patient was also noted to have evolving renal failure and profound acidosis.  Renal was consulted and recommended CVVH and pt was tx to [facility] under PCCM.   Subjective/Overnight: 6/12- pulled out HD catheter on L. Still has permcath on R  Vital Signs: Temp:  [97.4 F (36.3 C)-98.8 F (37.1 C)] 97.5 F (36.4 C) (06/12 0731) Pulse Rate:  [28-74] 65  (06/12 0419) Resp:  [13-24] 13  (06/12 0419) BP: (87-116)/(54-81) 104/69 mmHg (06/12 0731) SpO2:  [95 %-100 %] 97 % (06/12 0731) Weight:  [104.9 kg (231 lb 4.2 oz)-105.9 kg (233 lb 7.5 oz)] 105.9 kg (233 lb 7.5 oz) (06/12 0731)  Physical Examination: General: chronically ill appearing male, NAD  Neuro: more awake, alert  CV: s1s2 rrr PULM: resps even non labored GI: abd soft, +bs Extremities: warm and dry, no edema    Principal Problem:  *Sepsis secondary to suspected Fornier's gangrene Active Problems:  HIV INFECTION  ARF (acute renal failure) in the setting of stage III CKD  Metabolic acidosis  Hyperkalemia  Normocytic anemia  Hyponatremia  Acute respiratory failure   ASSESSMENT AND PLAN  PULMONARY  Lab 02/18/12 2007 02/18/12 0525 02/17/12 1757 02/17/12 1242  PHART 7.551* -- 7.298* 7.129*  PCO2ART 36.4 -- 26.1* 24.9*  PO2ART 152.0* -- 216.0* 195.0*  HCO3 32.1* 27.0* 12.7* 7.9*  O2SAT 100.0 71.0 100.0 98.4   Ventilator Settings:  CXR:  6/9>> int prom  ETT:  none  A:  Acute respiratory distress - respiratory compensation in setting metabolic acidosis Hemoptysis resolved  6/10 P:   ddavp improved hemoptysis, no role FOB Reduce O2 needs -upright position, IS  CARDIOVASCULAR  Lab 02/17/12 1830 02/17/12 0651  TROPONINI -- --  LATICACIDVEN 1.1 1.7  PROBNP 42513.0* --   ECG:  NSR Lines:  L IJ HD cath 6/8>>>pt pulled out 6/12 Rt subclav perm cath  6/11>>>  A: SIRS - likely r/t suspected Fournier's gangrene with peroneal and perirectal abscesses.  BP ok.  Lactate not impressive.  P:  Remains nomrmotensive Tele Rate controlled fib  RENAL  Lab 02/20/12 1755 02/20/12 0500 02/19/12 1718 02/19/12 0500 02/18/12 1858 02/18/12 0500 02/17/12 1830  NA 134* 136 136 136 134* -- --  K 4.3 4.2 -- -- -- -- --  CL 101 101 99 97 89* -- --  CO2 24 28 28 30 31  -- --  BUN 26* 23 29* 44* 64* -- --  CREATININE 3.09* 2.16* 2.42* 3.02* 4.05* -- --  CALCIUM 7.7* 7.9* 8.2* 8.4 7.5* -- --  MG -- 2.4 -- 2.4 -- 2.1 2.2  PHOS 3.5 3.1 3.5 4.4 5.2* -- --   Intake/Output      06/11 0701 - 06/12 0700 06/12 0701 - 06/13 0700   I.V. (mL/kg) 320 (3.1)    Blood 427.5    IV Piggyback 310    Total Intake(mL/kg) 1057.5 (10.1)    Urine (mL/kg/hr) 295 (0.1) 20   Other     Stool 7    Total Output 302 20   Net +755.5 -  20         Foley:  6/8  A:  Acute on chronic renal failure - ?progression of underlying disease (prob stage IV CKD with baseline SCr 2.47-2.78) c/b ATN in setting SIRS, volume depletion, ?drug toxicity.  Metabolic acidosis  P:   HD today  GASTROINTESTINAL  Lab 02/20/12 1755 02/20/12 0500 02/19/12 1718 02/19/12 0500 02/18/12 1858 02/17/12 1830 02/17/12 0850  AST -- -- -- -- -- 23 22  ALT -- -- -- -- -- 10 12  ALKPHOS -- -- -- -- -- 197* 105  BILITOT -- -- -- -- -- 0.8 0.6  PROT -- -- -- -- -- 6.3 7.5  ALBUMIN 1.3* 1.5* 1.8* 1.8* 1.8* -- --    A:  No acute issues  P:   Renal diet ppi  HEMATOLOGIC  Lab 02/21/12 0440 02/20/12 1755 02/20/12 0500 02/19/12 1725 02/19/12 0500 02/18/12 0500 02/17/12 1830  HGB 6.1* 6.2* 5.2* 7.0* 7.0* -- --  HCT 17.0*  17.3* 15.3* 20.3* 19.0* -- --  PLT 94* 99* 116* 150 151 -- --  INR 1.37 -- -- -- -- 1.40 1.53*  APTT 35 -- 33 -- 33 32 33   A:  Anemia - ?r/t hemoptysis.  Coags wnl.  P:  Some leakage blood from wound source? likely For 2 unit prbc today  On HD Cbc in am and this pm  INFECTIOUS  Lab 02/21/12 0440 02/20/12 1755 02/20/12 0500 02/19/12 1725 02/19/12 0500 02/17/12 1830 02/17/12 0620  WBC 16.5* 9.7 12.1* 10.8* 16.2* -- --  PROCALCITON -- -- -- -- -- 13.97 10.04   Cultures: BCx2 6/8>>> Urine 6/8>>> Wound (perirectal abscess) 6/8>>>  Antibiotics:  Per ID svc Zosyn 6/8>>> Vancomycin 6/8>>> clinda 6/9>>>  A:  Fournier's gangrene - s/p I/D 6/8.      HIV - last CD4 =440 04/17/11 P:   Repeat CD4 pending  Cont HIV rx  No further clinical shock ( tox prod(, dc clinda  ENDOCRINE  A:   No acute issues , low at times P:   Monitor blood sugars on bmet  Re advance diet, cbg for low glu x 1  NEUROLOGIC  A:  AMS - in setting SIRS and acute on chronic renal failure.  Somewhat improved.  P:   Supportive care  PT as now off cvvhd  BEST PRACTICE / DISPOSITION Level of Care:  ICU, consider transfer to sdu Primary Service:  PCCM Consultants:  Renal, ID, urology  Code Status:  full Diet:  NPO DVT Px:  SCD's GI Px:  protonix Skin Integrity:  Peroneal wounds/ surgical incisions  Social / Family:  No family available    Bryan Gilmore  [phone]  Cell  [phone]  If no response or cell goes to voicemail, call beeper [phone]

## 2012-02-21 NOTE — Progress Notes (Signed)
02/21/12 0000  Called eLink about pt frequent stools and bleeding from scrotal area. eLink RN and MD are aware of bleeding from pt scrotal area and pt frequent stools. MD ordered PTT, PT INR with AM CBC.   Will continue to monitor.   Elisha Headland RN

## 2012-02-21 NOTE — Progress Notes (Signed)
Patient ID: JAK HAGGAR, male   DOB: 10-07-1950, 61 y.o.   MRN: [id number]   [facility] Progress Note    Subjective:   Reports to be feeling fair- complains of fatigue and feeling sleepy   Objective:   BP 104/69  Pulse 65  Temp 97.5 F (36.4 C) (Oral)  Resp 13  Ht 5\' 11"  (1.803 m)  Wt 105.9 kg (233 lb 7.5 oz)  BMI 32.56 kg/m2  SpO2 97%  Intake/Output Summary (Last 24 hours) at 02/21/12 0848 Last data filed at 02/21/12 4098  Gross per 24 hour  Intake  857.5 ml  Output    322 ml  Net  535.5 ml   Weight change: -3.4 kg (-7 lb 7.9 oz)  Physical Exam: JXB:[id number] sleeping in bed YQM:VHQIO RRR, S1 and S2 with ESM Resp:CTA bilaterally, no rales/rhonchi NGE:XBMW, flat, tender over lower quadrants, BS normal UXL:KGMWN LE edema  Imaging: Dg Chest Port 1 View  02/20/2012  *RADIOLOGY REPORT*  Clinical Data: Status post dialysis catheter placement.  PORTABLE CHEST - 1 VIEW  Comparison: Chest radiograph 02/20/2012 at 5:14 hours.  Findings: Current radiograph at 10:26 hours.  A right IJ dialysis catheter has been placed.  One of the catheter tips is in the mid to distal superior vena cava, and the other catheter tip is at the junction of the superior vena cava and right atrium.  Left IJ dialysis catheter is stable, terminating near the junction of the innominate vein and superior vena cava.  Stable cardiomegaly. Pulmonary vascularity appears normal.  Aeration of both lungs is improved compared to the chest radiograph performed earlier today. Decreasing left basilar atelectasis compared to earlier today. Negative for pulmonary edema.  Negative for pneumothorax.  IMPRESSION: Satisfactory position of right IJ dialysis catheter.  Negative for pneumothorax. Improved aeration of the lungs compared to most recent prior.  Original Report Authenticated By: Britta Mccreedy, M.D.   Dg Chest Port 1 View  02/20/2012  *RADIOLOGY REPORT*  Clinical Data: Hemoptysis and shortness of  breath.  PORTABLE CHEST - 1 VIEW  Comparison: 02/19/2012  Findings: Left jugular dialysis catheter tip in the left innominate vein region.  Hazy densities at the left lung base.  Negative for a pneumothorax.  There are low lung volumes and the trachea is midline. Heart size is upper limits of normal.  IMPRESSION: Low lung volumes and probable left basilar atelectasis.  Dialysis catheter tip in the left innominate vein.  This catheter positioning is not optimal and may result in poor flows.  Original Report Authenticated By: Richarda Overlie, M.D.   Dg Fluoro Guide Cv Line-no Report  02/20/2012  CLINICAL DATA: diatec insertion, right   FLOURO GUIDE CV LINE  Fluoroscopy was utilized by the requesting physician.  No radiographic  interpretation.      Labs: BMET  Lab 02/20/12 1755 02/20/12 0500 02/19/12 1718 02/19/12 0500 02/18/12 1858 02/18/12 0500 02/17/12 1830  NA 134* 136 136 136 134* 135 132*  K 4.3 4.2 4.0 4.1 4.1 4.0 5.3*  CL 101 101 99 97 89* 92* 99  CO2 24 28 28 30 31 25  12*  GLUCOSE 135* 114* 105* 98 101* 96 101*  BUN 26* 23 29* 44* 64* 98* 121*  CREATININE 3.09* 2.16* 2.42* 3.02* 4.05* 5.95* 8.51*  ALB -- -- -- -- -- -- --  CALCIUM 7.7* 7.9* 8.2* 8.4 7.5* 7.8* 8.0*  PHOS 3.5 3.1 3.5 4.4 5.2* 6.6*6.6* 9.5*   CBC  Lab 02/21/12 0440 02/20/12 1755 02/20/12 0500  02/19/12 1725 02/17/12 1830 02/17/12 0616  WBC 16.5* 9.7 12.1* 10.8* -- --  NEUTROABS -- -- -- -- 25.8* 25.5*  HGB 6.1* 6.2* 5.2* 7.0* -- --  HCT 17.0* 17.3* 15.3* 20.3* -- --  MCV 79.8 80.1 84.5 83.9 -- --  PLT 94* 99* 116* 150 -- --    Medications:      . antiseptic oral rinse  15 mL Mouth Rinse BID  . darbepoetin (ARANESP) injection - DIALYSIS  100 mcg Intravenous Q Wed-HD  . lamiVUDine  150 mg Oral Daily  . lopinavir-ritonavir  5 mL Oral BID WC  . pantoprazole (PROTONIX) IV  40 mg Intravenous QHS  . paricalcitol  1 mcg Oral Daily  . piperacillin-tazobactam (ZOSYN)  IV  2.25 g Intravenous Q8H  . sodium chloride        . Zidovudine  300 mg Oral Q12H  . DISCONTD: clindamycin (CLEOCIN) IV  900 mg Intravenous Q8H  . DISCONTD: piperacillin-tazobactam  3.375 g Intravenous Q6H  . DISCONTD: vancomycin  1,000 mg Intravenous Q24H     Assessment/ Plan:   1. Acute on chronic renal failure IV- Baseline creatinine around 4.5 and anticipate progression to ESRD. Plan for HD today with PRBC transfusion at the same time. No acute HD needs at this time. Agree with [facility]VS that if AVG is the only option- deferring this for now with leukocytosis/infection risk may be the better option. 2. Anemia- s/p 1 unit PRBC overnight with minimal rise in Hgb overnight. Ongoing losses from surgical/debridement site noted. On ESA. Plan for 2 units PRBCs at HD . 3. Fournier's gangrene- s/p I&D, on IV abx 4. Hep C/HIV- on ARVs per primary service 5. AMS - Improved   Zetta Bills, MD 02/21/2012, 8:48 AM

## 2012-02-21 NOTE — Progress Notes (Signed)
VASCULAR PROGRESS NOTE  SUBJECTIVE: Tired  PHYSICAL EXAM: Filed Vitals:   02/20/12 2235 02/20/12 2300 02/21/12 0419 02/21/12 0731  BP: 106/66 96/61 102/79   Pulse: 71 66 65   Temp: 98.6 F (37 C) 97.4 F (36.3 C) 98.4 F (36.9 C) 97.5 F (36.4 C)  TempSrc: Oral Oral Oral Oral  Resp:  20 13   Height:      Weight:   231 lb 4.2 oz (104.9 kg)   SpO2:   97%    Cath site looks fine  LABS: Lab Results  Component Value Date   WBC 16.5* 02/21/2012   HGB 6.1* 02/21/2012   HCT 17.0* 02/21/2012   MCV 79.8 02/21/2012   PLT 94* 02/21/2012   Lab Results  Component Value Date   CREATININE 3.09* 02/20/2012   Lab Results  Component Value Date   INR 1.37 02/21/2012   CBG (last 3)   Basename 02/21/12 0006 02/20/12 1813 02/20/12 0828  GLUCAP 130* 113* 137*     ASSESSMENT/PLAN: 1. 1 Day Post-Op s/p: Diatek 2. Vein map pending 3. Plan AVF tomorrow if there is a reasonable vein. If not, would probably be best to hold off on AVG given persistent elevated WBC and large wound.   Waverly Ferrari, MD, FACS Beeper: [phone] 02/21/2012

## 2012-02-21 NOTE — Progress Notes (Signed)
INFECTIOUS DISEASE PROGRESS NOTE  ID: Bryan Gilmore is a 61 y.o. male with HIV, CD 4 count of 440, VL 16,000 (in Aug 2012) off of ART in the last year, presents with fournier's gangrene and sepsis s/p emergent debridement but no cultures sent from OR on 6/8 started on vanco and piptazo, WBC down to 16.2 from 38. ART restarted  24hr events: Pt feels fatigued today. Afebrile. Had diatek catheter placed. Getting RBC tsf due to low H/H. Elevated WBC up to 16 from 9 overnight.  Abtx:  vanco -randomly dosed piptazo ART: Lamivudine/zidovudine/lopinavir-ritonavir  Medications:     . antiseptic oral rinse  15 mL Mouth Rinse BID  . darbepoetin (ARANESP) injection - DIALYSIS  100 mcg Intravenous Q Wed-HD  . lamiVUDine  150 mg Oral Daily  . lopinavir-ritonavir  5 mL Oral BID WC  . pantoprazole (PROTONIX) IV  40 mg Intravenous QHS  . paricalcitol  1 mcg Oral Daily  . piperacillin-tazobactam (ZOSYN)  IV  2.25 g Intravenous Q8H  . sodium chloride      . Zidovudine  300 mg Oral Q12H  . DISCONTD: piperacillin-tazobactam  3.375 g Intravenous Q6H  . DISCONTD: vancomycin  1,000 mg Intravenous Q24H   Objective: Vital signs in last 24 hours: Temp:  [97.4 F (36.3 C)-98.8 F (37.1 C)] 98 F (36.7 C) (06/12 1201) Pulse Rate:  [63-105] 105  (06/12 1201) Resp:  [13-24] 21  (06/12 1201) BP: (91-116)/(54-79) 113/77 mmHg (06/12 1201) SpO2:  [92 %-99 %] 99 % (06/12 1201) Weight:  [231 lb 4.2 oz (104.9 kg)-233 lb 7.5 oz (105.9 kg)] 233 lb 7.5 oz (105.9 kg) (06/12 0731) General appearance: alert when awakened but seems to answer questions appropriately  Neck: left IJ catheter in place with CVVH, no erythema  Cardio: regular rate and rhythm, S1, S2 normal, no murmur, click, rub or gallop  GI: soft, non-tender; bowel sounds normal; no masses, no organomegaly  Pelvic: wrapped but surrounding area without erythema or induration.  Extremities: edema mild    Lab Results  Basename 02/21/12 0440 02/20/12  1755 02/20/12 0500  WBC 16.5* 9.7 --  HGB 6.1* 6.2* --  HCT 17.0* 17.3* --  NA -- 134* 136  K -- 4.3 4.2  CL -- 101 101  CO2 -- 24 28  BUN -- 26* 23  CREATININE -- 3.09* 2.16*  GLU -- -- --   Liver Panel  Basename 02/20/12 1755 02/20/12 0500  PROT -- --  ALBUMIN 1.3* 1.5*  AST -- --  ALT -- --  ALKPHOS -- --  BILITOT -- --  BILIDIR -- --  IBILI -- --    Microbiology: Recent Results (from the past 240 hour(s))  CULTURE, BLOOD (ROUTINE X 2)     Status: Normal (Preliminary result)   Collection Time   02/17/12  8:50 AM      Component Value Range Status Comment   Specimen Description BLOOD RIGHT ARM   Final    Special Requests     Final    Value: BOTTLES DRAWN AEROBIC AND ANAEROBIC 2CC BOTH BOTTLES   Culture  Setup Time [id number]   Final    Culture     Final    Value:        BLOOD CULTURE RECEIVED NO GROWTH TO DATE CULTURE WILL BE HELD FOR 5 DAYS BEFORE ISSUING A FINAL NEGATIVE REPORT   Report Status PENDING   Incomplete   WOUND CULTURE     Status: Normal   Collection Time  02/17/12 12:00 PM      Component Value Range Status Comment   Specimen Description ABSCESS PERIRECTAL   Final    Special Requests NONE   Final    Gram Stain     Final    Value: NO WBC SEEN     NO SQUAMOUS EPITHELIAL CELLS SEEN     NO ORGANISMS SEEN   Culture NO GROWTH 2 DAYS   Final    Report Status 02/20/2012 FINAL   Final   ANAEROBIC CULTURE     Status: Normal   Collection Time   02/17/12 12:00 PM      Component Value Range Status Comment   Specimen Description ABSCESS PERIRECTAL   Final    Special Requests NONE   Final    Gram Stain     Final    Value: FEW WBC PRESENT,BOTH PMN AND MONONUCLEAR     NO SQUAMOUS EPITHELIAL CELLS SEEN     NO ORGANISMS SEEN   Culture NO ANAEROBES ISOLATED   Final    Report Status 02/21/2012 FINAL   Final   MRSA PCR SCREENING     Status: Normal   Collection Time   02/17/12  1:57 PM      Component Value Range Status Comment   MRSA by PCR NEGATIVE  NEGATIVE Final    URINE CULTURE     Status: Normal   Collection Time   02/17/12  2:55 PM      Component Value Range Status Comment   Specimen Description URINE, CATHETERIZED   Final    Special Requests Immunocompromised   Final    Culture  Setup Time [id number]   Final    Colony Count NO GROWTH   Final    Culture NO GROWTH   Final    Report Status 02/18/2012 FINAL   Final   URINE CULTURE     Status: Normal   Collection Time   02/17/12  5:28 PM      Component Value Range Status Comment   Specimen Description URINE, CATHETERIZED   Final    Special Requests NONE   Final    Culture  Setup Time [id number]   Final    Colony Count NO GROWTH   Final    Culture NO GROWTH   Final    Report Status 02/18/2012 FINAL   Final   CULTURE, BLOOD (ROUTINE X 2)     Status: Normal (Preliminary result)   Collection Time   02/17/12  6:13 PM      Component Value Range Status Comment   Specimen Description BLOOD RIGHT ARM   Final    Special Requests BOTTLES DRAWN AEROBIC AND ANAEROBIC 10CC   Final    Culture  Setup Time [id number]   Final    Culture     Final    Value:        BLOOD CULTURE RECEIVED NO GROWTH TO DATE CULTURE WILL BE HELD FOR 5 DAYS BEFORE ISSUING A FINAL NEGATIVE REPORT   Report Status PENDING   Incomplete   CULTURE, BLOOD (ROUTINE X 2)     Status: Normal (Preliminary result)   Collection Time   02/17/12  6:30 PM      Component Value Range Status Comment   Specimen Description BLOOD LEFT HEMODIALYSIS CATHETER   Final    Special Requests BOTTLES DRAWN AEROBIC AND ANAEROBIC 10CC   Final    Culture  Setup Time [id number]   Final    Culture  Final    Value:        BLOOD CULTURE RECEIVED NO GROWTH TO DATE CULTURE WILL BE HELD FOR 5 DAYS BEFORE ISSUING A FINAL NEGATIVE REPORT   Report Status PENDING   Incomplete     Studies/Results: Dg Chest Port 1 View  02/20/2012  *RADIOLOGY REPORT*  Clinical Data: Status post dialysis catheter placement.  PORTABLE CHEST - 1 VIEW  Comparison: Chest radiograph  02/20/2012 at 5:14 hours.  Findings: Current radiograph at 10:26 hours.  A right IJ dialysis catheter has been placed.  One of the catheter tips is in the mid to distal superior vena cava, and the other catheter tip is at the junction of the superior vena cava and right atrium.  Left IJ dialysis catheter is stable, terminating near the junction of the innominate vein and superior vena cava.  Stable cardiomegaly. Pulmonary vascularity appears normal.  Aeration of both lungs is improved compared to the chest radiograph performed earlier today. Decreasing left basilar atelectasis compared to earlier today. Negative for pulmonary edema.  Negative for pneumothorax.  IMPRESSION: Satisfactory position of right IJ dialysis catheter.  Negative for pneumothorax. Improved aeration of the lungs compared to most recent prior.  Original Report Authenticated By: Britta Mccreedy, M.D.   Dg Chest Port 1 View  02/20/2012  *RADIOLOGY REPORT*  Clinical Data: Hemoptysis and shortness of breath.  PORTABLE CHEST - 1 VIEW  Comparison: 02/19/2012  Findings: Left jugular dialysis catheter tip in the left innominate vein region.  Hazy densities at the left lung base.  Negative for a pneumothorax.  There are low lung volumes and the trachea is midline. Heart size is upper limits of normal.  IMPRESSION: Low lung volumes and probable left basilar atelectasis.  Dialysis catheter tip in the left innominate vein.  This catheter positioning is not optimal and may result in poor flows.  Original Report Authenticated By: Richarda Overlie, M.D.   Dg Fluoro Guide Cv Line-no Report  02/20/2012  CLINICAL DATA: diatec insertion, right   FLOURO GUIDE CV LINE  Fluoroscopy was utilized by the requesting physician.  No radiographic  interpretation.       Assessment/Plan: Polymicrobial fournier's gangrene: continue with vancomycin and piptazo. Increased WBC over the last 24hr is concerning that may need further debridement of necrotic tissue. Will discuss with  urology wound evaluation  HIV= continue on current regimen of kaletra, lamivudine, zidovudine for now.  Kamee Bobst Infectious Diseases 02/21/2012, 1:47 PM

## 2012-02-21 NOTE — Evaluation (Signed)
Physical Therapy Evaluation Patient Details Name: Bryan Gilmore MRN: [id number] DOB: 10-05-1950 Today's Date: 02/21/2012 Time: 4098-1191 PT Time Calculation (min): 22 min  PT Assessment / Plan / Recommendation Clinical Impression    Pt is 61 y/o male admitted for Fournier gangrene and perineal abscesses and was taken to the OR for surgical correction 6/8. Pt with acute renal failure and was transferred from [facility] to [facility] for HD. Pt with low Hgb on evaluation therefore limited however pt moving well but confused. No dizziness reported and BP maintained 113/77.  Pt will benefit from acute PT services to improve overall mobility and prepare for safe d/c.    PT Assessment  Patient needs continued PT services    Follow Up Recommendations  Home health PT;Supervision/Assistance - 24 hour    Barriers to Discharge        lEquipment Recommendations  Other (comment) (To be determine)    Recommendations for Other Services     Frequency Min 3X/week    Precautions / Restrictions     Pertinent Vitals/Pain No c/o pain      Mobility  Bed Mobility Bed Mobility: Supine to Sit;Sit to Supine Supine to Sit: 4: Min assist Sit to Supine: 4: Min assist Details for Bed Mobility Assistance: (A) with LE into and OOB with cues for technique.  Pt needed extra time to complete. Transfers Transfers: Sit to Stand;Stand to Sit Sit to Stand: 4: Min assist;From bed (+2 lines) Stand to Sit: 4: Min assist;To bed (+2 lines) Details for Transfer Assistance: (A) to initiate transfer and slowly descend to recliner with max cues for technique Ambulation/Gait Ambulation/Gait Assistance: 1: +2 Total assist Ambulation/Gait: Patient Percentage: 70% Ambulation Distance (Feet): 30 Feet Assistive device: 2 person hand held assist Ambulation/Gait Assistance Details: (A) to maintain balance with occasional lateral sway.  Gait Pattern: Shuffle;Lateral hip instability;Wide base of support    Exercises     PT Diagnosis:  Difficulty walking;Abnormality of gait;Generalized weakness;Acute pain  PT Problem List: Decreased strength;Decreased activity tolerance;Decreased balance;Decreased mobility;Decreased coordination;Decreased cognition;Decreased knowledge of use of DME PT Treatment Interventions: DME instruction;Gait training;Stair training;Functional mobility training;Therapeutic activities;Therapeutic exercise;Balance training;Patient/family education   PT Goals Acute Rehab PT Goals PT Goal Formulation: Patient unable to participate in goal setting Time For Goal Achievement: 03/06/12 Potential to Achieve Goals: Good Pt will go Supine/Side to Sit: with modified independence PT Goal: Supine/Side to Sit - Progress: Goal set today Pt will go Sit to Supine/Side: with modified independence PT Goal: Sit to Supine/Side - Progress: Goal set today Pt will go Sit to Stand: with modified independence PT Goal: Sit to Stand - Progress: Goal set today Pt will go Stand to Sit: with modified independence PT Goal: Stand to Sit - Progress: Goal set today Pt will Stand: with modified independence PT Goal: Stand - Progress: Goal set today Pt will Ambulate: >150 feet;with least restrictive assistive device;with supervision PT Goal: Ambulate - Progress: Goal set today Pt will Go Up / Down Stairs: 6-9 stairs;with min assist;with least restrictive assistive device PT Goal: Up/Down Stairs - Progress: Goal set today  Visit Information  Last PT Received On: 02/21/12 Assistance Needed: +2 (lines)    Subjective Data  Subjective: "No I don't know where I'm at. Patient Stated Goal: Did not set   Prior Functioning  Home Living Lives With: Alone Type of Home: House Home Access: Stairs to enter Entrance Stairs-Number of Steps: 8 Home Layout: One level Bathroom Shower/Tub: Walk-in shower;Door [employer] Toilet: Standard Home Adaptive Equipment: None Additional  Comments: Pt able to answer home environment questions however very  confused and unsure of accuracy. Prior Function Comments: Pt did not report prior level of function Communication Communication: Other (comment) (very little verbalization)    Cognition  Overall Cognitive Status: Impaired Area of Impairment: Memory;Safety/judgement;Problem solving Arousal/Alertness: Awake/alert Orientation Level: Disoriented to;Place;Time;Situation Behavior During Session: WFL for tasks performed Safety/Judgement: Decreased awareness of safety precautions;Decreased safety judgement for tasks assessed;Impulsive Problem Solving: Pt slow to process at times    Extremity/Trunk Assessment Right Lower Extremity Assessment RLE ROM/Strength/Tone: Unable to fully assess;Due to impaired cognition (At least 3/5 gross with functional mobility) Left Lower Extremity Assessment LLE ROM/Strength/Tone: Unable to fully assess;Due to impaired cognition (At least 3/5 gross with functional mobility)   Balance Balance Balance Assessed: Yes Static Sitting Balance Static Sitting - Balance Support: Feet supported Static Sitting - Level of Assistance: 5: Stand by assistance  End of Session PT - End of Session Equipment Utilized During Treatment: Gait belt Activity Tolerance:  (Limited due to low Hgb however pt asytompatic.) Patient left: in chair;with call bell/phone within reach;with restraints reapplied Nurse Communication: Mobility status   Laquita Harlan 02/21/2012, 3:28 PM Jake Shark, PT DPT [phone]

## 2012-02-21 NOTE — Progress Notes (Signed)
Pt going to dialysis, noticed pt more lethargic and confused this afternoon. Bryan Gilmore

## 2012-02-21 NOTE — Progress Notes (Signed)
Clinical Social Work  CSW received referral to assist with dc planning. CSW reviewed chart which stated PT recommended HH vs 24 hour care. CSW has [employer] which will not pay for custodial care. CSW will meet with patient to discuss applying for Medicaid if he is interested in placement. Patient is in dialysis. CSW will follow up on 02/22/12.  [place], [place] [phone] (Coverage for Angelia Mould)

## 2012-02-21 NOTE — Progress Notes (Signed)
Called dr Delford Field re pt pulling out left neck hd cath with pigtail. Occlusive dressing applied, dressing change to scrotal area, will come assess. Beryle Quant

## 2012-02-22 ENCOUNTER — Encounter (HOSPITAL_COMMUNITY)
Admission: EM | Disposition: A | Discharge status: Other Acute Inpatient Hospital | Payer: Self-pay | Source: Home / Self Care | Attending: Pulmonary Disease

## 2012-02-22 ENCOUNTER — Inpatient Hospital Stay (HOSPITAL_COMMUNITY): Payer: BC Managed Care – PPO

## 2012-02-22 ENCOUNTER — Encounter (HOSPITAL_COMMUNITY): Payer: Self-pay | Admitting: Vascular Surgery

## 2012-02-22 DIAGNOSIS — J9600 Acute respiratory failure, unspecified whether with hypoxia or hypercapnia: Secondary | ICD-10-CM | POA: Diagnosis not present

## 2012-02-22 DIAGNOSIS — A419 Sepsis, unspecified organism: Secondary | ICD-10-CM

## 2012-02-22 DIAGNOSIS — B199 Unspecified viral hepatitis without hepatic coma: Secondary | ICD-10-CM | POA: Diagnosis not present

## 2012-02-22 DIAGNOSIS — R339 Retention of urine, unspecified: Secondary | ICD-10-CM | POA: Diagnosis not present

## 2012-02-22 DIAGNOSIS — N179 Acute kidney failure, unspecified: Secondary | ICD-10-CM | POA: Diagnosis not present

## 2012-02-22 LAB — PREPARE RBC (CROSSMATCH)

## 2012-02-22 LAB — CBC
HCT: 23.6 % — ABNORMAL LOW (ref 39.0–52.0)
MCH: 28.6 pg (ref 26.0–34.0)
MCHC: 34.7 g/dL (ref 30.0–36.0)
MCV: 80.8 fL (ref 78.0–100.0)
Platelets: 89 10*3/uL — ABNORMAL LOW (ref 150–400)
RBC: 2.55 MIL/uL — ABNORMAL LOW (ref 4.22–5.81)
RDW: 15.2 % (ref 11.5–15.5)

## 2012-02-22 LAB — GLUCOSE, CAPILLARY
Glucose-Capillary: 148 mg/dL — ABNORMAL HIGH (ref 70–99)
Glucose-Capillary: 132 mg/dL — ABNORMAL HIGH (ref 70–99)

## 2012-02-22 LAB — POCT I-STAT 3, ART BLOOD GAS (G3+)
O2 Saturation: 98 %
pCO2 arterial: 31.1 mmHg — ABNORMAL LOW (ref 35.0–45.0)

## 2012-02-22 LAB — BASIC METABOLIC PANEL
CO2: 27 mEq/L (ref 19–32)
Calcium: 7.9 mg/dL — ABNORMAL LOW (ref 8.4–10.5)
Glucose, Bld: 128 mg/dL — ABNORMAL HIGH (ref 70–99)
Sodium: 136 mEq/L (ref 135–145)

## 2012-02-22 SURGERY — ARTERIOVENOUS (AV) FISTULA CREATION
Anesthesia: Monitor Anesthesia Care | Site: Arm Lower

## 2012-02-22 MED ORDER — LAMIVUDINE 10 MG/ML PO SOLN
50.0000 mg | Freq: Every day | ORAL | Status: DC
  Administered 2012-02-23 – 2012-03-01 (×8): 50 mg via ORAL
  Filled 2012-02-22 (×8): qty 5

## 2012-02-22 MED ORDER — ZIDOVUDINE 50 MG/5ML PO SYRP
300.0000 mg | ORAL_SOLUTION | Freq: Every day | ORAL | Status: DC
  Administered 2012-02-23 – 2012-03-01 (×8): 300 mg via ORAL
  Filled 2012-02-22 (×8): qty 30

## 2012-02-22 NOTE — Progress Notes (Addendum)
Name: Bryan Gilmore MRN: [id number] DOB: 1951-07-05    LOS: 5  Referring Provider:  Urology Reason for Referral:  Respiratory failure and acidosis.  PULMONARY / CRITICAL CARE MEDICINE  HPI:  61 year old male with HIV PMH presented to [facility] with 1 wk history of testicular discomfort and swelling, dysuria and difficulty urinating. In ER was noted to have Fournier gangrene and perineal abscesses and was taken to the OR for surgical correction 6/8.  Patient was also noted to have evolving renal failure and profound acidosis.  Renal was consulted and recommended CVVH and pt was tx to [facility] under PCCM.   Subjective/Overnight: 6/12- pulled out HD catheter on L. Still has permcath on R 6/13- in icu, some bleeding resolved  Vital Signs: Temp:  [97.1 F (36.2 C)-98.8 F (37.1 C)] 98.2 F (36.8 C) (06/13 1206) Pulse Rate:  [58-84] 61  (06/13 1200) Resp:  [13-21] 18  (06/13 1200) BP: (84-126)/(37-85) 110/63 mmHg (06/13 1200) SpO2:  [93 %-100 %] 100 % (06/13 1200) Weight:  [99.7 kg (219 lb 12.8 oz)-105.9 kg (233 lb 7.5 oz)] 99.7 kg (219 lb 12.8 oz) (06/13 0500)  Physical Examination: General: int confused  Neuro: more awake, alert  CV: s1s2 rrr PULM: resps even non labored GI: abd soft, +bs Extremities: warm and dry, no edema  Wound dressed, dry   Principal Problem:  *Sepsis secondary to suspected Fornier's gangrene Active Problems:  HIV INFECTION  ARF (acute renal failure) in the setting of stage III CKD  Metabolic acidosis  Hyperkalemia  Normocytic anemia  Hyponatremia  Acute respiratory failure   ASSESSMENT AND PLAN  PULMONARY  Lab 02/18/12 2007 02/18/12 0525 02/17/12 1757 02/17/12 1242  PHART 7.551* -- 7.298* 7.129*  PCO2ART 36.4 -- 26.1* 24.9*  PO2ART 152.0* -- 216.0* 195.0*  HCO3 32.1* 27.0* 12.7* 7.9*  O2SAT 100.0 71.0 100.0 98.4   Ventilator Settings:  CXR:  6/9>> int prom  ETT:  none  A:  Acute respiratory distress - respiratory compensation in setting metabolic  acidosis Hemoptysis resolved 6/10 P:   abg as lethargic int Reduce O2 needs  CARDIOVASCULAR  Lab 02/17/12 1830 02/17/12 0651  TROPONINI -- --  LATICACIDVEN 1.1 1.7  PROBNP 42513.0* --   ECG:  NSR Lines:  L IJ HD cath 6/8>>>pt pulled out 6/12 Rt subclav perm cath  6/11>>>  A: SIRS - likely r/t suspected Fournier's gangrene with peroneal and perirectal abscesses.  P:  Remains nomrmotensive Tele Rate controlled fib Consider 1 unit, oozing / bleeding, hgb borderline  RENAL  Lab 02/22/12 0340 02/21/12 1458 02/20/12 1755 02/20/12 0500 02/19/12 1718 02/19/12 0500 02/18/12 0500 02/17/12 1830  NA 136 134* 134* 136 136 -- -- --  K 3.6 5.0 -- -- -- -- -- --  CL 100 99 101 101 99 -- -- --  CO2 27 23 24 28 28  -- -- --  BUN 15 35* 26* 23 29* -- -- --  CREATININE 2.88* 4.55* 3.09* 2.16* 2.42* -- -- --  CALCIUM 7.9* 7.8* 7.7* 7.9* 8.2* -- -- --  MG -- -- -- 2.4 -- 2.4 2.1 2.2  PHOS -- 5.6* 3.5 3.1 3.5 4.4 -- --   Intake/Output      06/12 0701 - 06/13 0700 06/13 0701 - 06/14 0700   P.O. 220    I.V. (mL/kg) 10 (0.1)    Blood 700    IV Piggyback 160    Total Intake(mL/kg) 1090 (10.9)    Urine (mL/kg/hr) 45 (0)  Other 614    Stool 3    Total Output 662    Net +428          Foley:  6/8  A:  Acute on chronic renal failure - ?progression of underlying disease (prob stage IV CKD with baseline SCr 2.47-2.78) c/b ATN in setting SIRS, volume depletion, ?drug toxicity.  Metabolic acidosis  P:   HD today  GASTROINTESTINAL  Lab 02/21/12 1458 02/20/12 1755 02/20/12 0500 02/19/12 1718 02/19/12 0500 02/17/12 1830 02/17/12 0850  AST -- -- -- -- -- 23 22  ALT -- -- -- -- -- 10 12  ALKPHOS -- -- -- -- -- 197* 105  BILITOT -- -- -- -- -- 0.8 0.6  PROT -- -- -- -- -- 6.3 7.5  ALBUMIN 1.3* 1.3* 1.5* 1.8* 1.8* -- --    A:  No acute issues  P:   Renal diet ppi  HEMATOLOGIC  Lab 02/22/12 0340 02/21/12 2038 02/21/12 1458 02/21/12 0440 02/20/12 1755 02/20/12 0500 02/19/12 0500  02/18/12 0500 02/17/12 1830  HGB 7.3* 8.3* 5.5* 6.1* 6.2* -- -- -- --  HCT 20.6* 23.8* 15.4* 17.0* 17.3* -- -- -- --  PLT 89* 91* 97* 94* 99* -- -- -- --  INR -- -- -- 1.37 -- -- -- 1.40 1.53*  APTT -- -- -- 35 -- 33 33 32 33   A:  Anemia - ?r/t hemoptysis.  Coags wnl.  P:  Continued bleeding, 1 unit Cbc to follow scd If bleed again, consider repeat ddavp, plat tx  INFECTIOUS  Lab 02/22/12 0340 02/21/12 2038 02/21/12 1458 02/21/12 0440 02/20/12 1755 02/17/12 1830 02/17/12 0620  WBC 13.7* 15.0* 17.8* 16.5* 9.7 -- --  PROCALCITON -- -- -- -- -- 13.97 10.04   Cultures: BCx2 6/8>>> Urine 6/8>>> Wound (perirectal abscess) 6/8>>>  Antibiotics:  Per ID svc Zosyn 6/8>>> Vancomycin 6/8>>> clinda 6/9>>>  A:  Fournier's gangrene - s/p I/D 6/8.      HIV - last CD4 =440 04/17/11 P:   Repeat CD4 pending  Cont HIV rx  Consider dc vanc Continue empiric course zosyn  ENDOCRINE  A:   No acute issues , low at times P:   cbg with chem  NEUROLOGIC  A:  AMS - in setting SIRS and acute on chronic renal failure. Confused int  P:   Supportive care  abg  BEST PRACTICE / DISPOSITION Level of Care:  ICU, consider transfer to sdu Primary Service:  PCCM Consultants:  Renal, ID, urology  Code Status:  full Diet:  NPO DVT Px:  SCD's GI Px:  protonix Skin Integrity:  Peroneal wounds/ surgical incisions  Social / Family:  No family available   Mcarthur Rossetti. Tyson Alias, MD, FACP Pgr: [phone] [facility]

## 2012-02-22 NOTE — Progress Notes (Signed)
VASCULAR PROGRESS NOTE  SUBJECTIVE: No complaints  PHYSICAL EXAM: Filed Vitals:   02/22/12 0343 02/22/12 0400 02/22/12 0500 02/22/12 0600  BP:  106/54 102/52 92/54  Pulse:   69 80  Temp: 98.1 F (36.7 C)     TempSrc: Oral     Resp:  16 15 18   Height:      Weight:   219 lb 12.8 oz (99.7 kg)   SpO2:   98% 98%   Cath site fine  LABS: Lab Results  Component Value Date   WBC 13.7* 02/22/2012   HGB 7.3* 02/22/2012   HCT 20.6* 02/22/2012   MCV 80.8 02/22/2012   PLT 89* 02/22/2012   Lab Results  Component Value Date   CREATININE 2.88* 02/22/2012   Lab Results  Component Value Date   INR 1.37 02/21/2012   CBG (last 3)   Basename 02/21/12 2337 02/21/12 1925 02/21/12 0729  GLUCAP 181* 72 85     ASSESSMENT/PLAN: Has Diatek catheter for Dialysis. Vein map shows that he has very poor quality veins and will likely require an AVG. Given his H/O a polymicrobial fournier's gangrene, with persistently elevated WBC count. I feel that it would be best not to place an AVG until wound much better and infection is no longer an issue. Would like ID's recommendations on that. No plans for surgery today.  Waverly Ferrari, MD, FACS Beeper: [phone] 02/22/2012

## 2012-02-22 NOTE — Progress Notes (Signed)
Patient ID: Bryan Gilmore, male   DOB: [birth year], 61 y.o.   MRN: [id number]    [facility] Progress Note    Subjective:   Transferred back to ICU after HD yesterday for closer monitoring with ongoing perineal blood loss/ABLA. Bryan Gilmore denies any emerging complaints and states he feels better after HD.   Objective:   BP 92/54  Pulse 80  Temp 98.1 F (36.7 C) (Oral)  Resp 18  Ht 5\' 11"  (1.803 m)  Wt 99.7 kg (219 lb 12.8 oz)  BMI 30.66 kg/m2  SpO2 98%  Intake/Output Summary (Last 24 hours) at 02/22/12 0714 Last data filed at 02/22/12 0600  Gross per 24 hour  Intake   1090 ml  Output    662 ml  Net    428 ml   Weight change: 1 kg (2 lb 3.3 oz)  Physical Exam: WUJ:[id number] sleeping in bed- easy to awaken/engage in conversation AOZ:HYQMV RRR, s1 and s2 with ESM Resp:CTA bilaterally, no rales/rhonchi HQI:ONGE, obese, NT, BS normal Ext:No LE edema  Imaging: Dg Chest Port 1 View  02/20/2012  *RADIOLOGY REPORT*  Clinical Data: Status post dialysis catheter placement.  PORTABLE CHEST - 1 VIEW  Comparison: Chest radiograph 02/20/2012 at 5:14 hours.  Findings: Current radiograph at 10:26 hours.  A right IJ dialysis catheter has been placed.  One of the catheter tips is in the mid to distal superior vena cava, and the other catheter tip is at the junction of the superior vena cava and right atrium.  Left IJ dialysis catheter is stable, terminating near the junction of the innominate vein and superior vena cava.  Stable cardiomegaly. Pulmonary vascularity appears normal.  Aeration of both lungs is improved compared to the chest radiograph performed earlier today. Decreasing left basilar atelectasis compared to earlier today. Negative for pulmonary edema.  Negative for pneumothorax.  IMPRESSION: Satisfactory position of right IJ dialysis catheter.  Negative for pneumothorax. Improved aeration of the lungs compared to most recent prior.  Original Report Authenticated By: Britta Mccreedy, M.D.   Dg Fluoro Guide Cv Line-no Report  02/20/2012  CLINICAL DATA: diatec insertion, right   FLOURO GUIDE CV LINE  Fluoroscopy was utilized by the requesting physician.  No radiographic  interpretation.      Labs: BMET  Lab 02/22/12 0340 02/21/12 1458 02/20/12 1755 02/20/12 0500 02/19/12 1718 02/19/12 0500 02/18/12 1858 02/18/12 0500  NA 136 134* 134* 136 136 136 134* --  K 3.6 5.0 4.3 4.2 4.0 4.1 4.1 --  CL 100 99 101 101 99 97 89* --  CO2 27 23 24 28 28 30 31  --  GLUCOSE 128* 105* 135* 114* 105* 98 101* --  BUN 15 35* 26* 23 29* 44* 64* --  CREATININE 2.88* 4.55* 3.09* 2.16* 2.42* 3.02* 4.05* --  ALB -- -- -- -- -- -- -- --  CALCIUM 7.9* 7.8* 7.7* 7.9* 8.2* 8.4 7.5* --  PHOS -- 5.6* 3.5 3.1 3.5 4.4 5.2* 6.6*6.6*   CBC  Lab 02/22/12 0340 02/21/12 2038 02/21/12 1458 02/21/12 0440 02/17/12 1830 02/17/12 0616  WBC 13.7* 15.0* 17.8* 16.5* -- --  NEUTROABS -- -- -- -- 25.8* 25.5*  HGB 7.3* 8.3* 5.5* 6.1* -- --  HCT 20.6* 23.8* 15.4* 17.0* -- --  MCV 80.8 82.9 81.5 79.8 -- --  PLT 89* 91* 97* 94* -- --    Medications:      . antiseptic oral rinse  15 mL Mouth Rinse BID  . darbepoetin (ARANESP) injection -  DIALYSIS  100 mcg Intravenous Q Wed-HD  . lamiVUDine  150 mg Oral Daily  . lopinavir-ritonavir  5 mL Oral BID WC  . pantoprazole (PROTONIX) IV  40 mg Intravenous QHS  . paricalcitol  1 mcg Oral Daily  . piperacillin-tazobactam (ZOSYN)  IV  2.25 g Intravenous Q8H  . vancomycin  1,000 mg Intravenous Q M,W,F-HD  . Zidovudine  300 mg Oral Q12H     Assessment/ Plan:   1. Acute on chronic renal failure IV- Baseline creatinine around 4.5 and appears he has progressed to ESRD. Plan for HD tomorrow with likely PRBC transfusion if Hgb<7. No acute HD needs at this time. From his vein mapping, it appears that AVG is his only option- discussed with Dr. Edilia Bo- this will be done after resolution of leukocytosis/improved wound healing. 2. Anemia- improved s/p PRBC  transfusion- monitor with perineal wound site losses. 3. Fournier's gangrene- s/p I&D, on IV Vancomycin/Zosyn 4. Hep C/HIV- on ARVs  5. AMS - Improved with HD/wound management  Zetta Bills, MD 02/22/2012, 7:14 AM

## 2012-02-22 NOTE — Progress Notes (Signed)
INFECTIOUS DISEASE PROGRESS NOTE  ID: Bryan Gilmore is a 61 y.o. male with HIV, CD 4 count of 440, VL 16,000 (in Aug 2012) off of ART in the last year, presents with fournier's gangrene and sepsis s/p emergent debridement but no cultures sent from OR on 6/8 started on vanco and piptazo, WBC down to 16.2 from 38. ART restarted  24hr events: Pt feels ok today. No fevers. Denies chills, nightsweats, nor groin pain. Getting blood tsf today due to blood loss likely from GU wound  Abtx:  vanco -randomly dosed piptazo ART: Lamivudine/zidovudine/lopinavir-ritonavir  Medications:     . antiseptic oral rinse  15 mL Mouth Rinse BID  . darbepoetin (ARANESP) injection - DIALYSIS  100 mcg Intravenous Q Wed-HD  . lamiVUDine  50 mg Oral Daily  . lopinavir-ritonavir  5 mL Oral BID WC  . pantoprazole (PROTONIX) IV  40 mg Intravenous QHS  . paricalcitol  1 mcg Oral Daily  . piperacillin-tazobactam (ZOSYN)  IV  2.25 g Intravenous Q8H  . Zidovudine  300 mg Oral Daily  . DISCONTD: lamiVUDine  150 mg Oral Daily  . DISCONTD: vancomycin  1,000 mg Intravenous Q M,W,F-HD  . DISCONTD: Zidovudine  300 mg Oral Q12H   Objective: Vital signs in last 24 hours: Temp:  [97.8 F (36.6 C)-98.8 F (37.1 C)] 98.8 F (37.1 C) (06/13 1538) Pulse Rate:  [58-100] 72  (06/13 1415) Resp:  [10-21] 10  (06/13 1515) BP: (84-126)/(37-85) 119/65 mmHg (06/13 1515) SpO2:  [93 %-100 %] 96 % (06/13 1415) Weight:  [219 lb 12.8 oz (99.7 kg)-233 lb 7.5 oz (105.9 kg)] 219 lb 12.8 oz (99.7 kg) (06/13 0500) General appearance: alert when awakened but seems to answer questions appropriately  Neck: left IJ catheter in place with CVVH, no erythema  Cardio: regular rate and rhythm, S1, S2 normal, no murmur, click, rub or gallop  GI: soft, non-tender; bowel sounds normal; no masses, no organomegaly  Pelvic: wrapped but surrounding area without erythema or induration.  Extremities: edema mild    Lab Results  Basename 02/22/12 0340  02/21/12 2038 02/21/12 1458  WBC 13.7* 15.0* --  HGB 7.3* 8.3* --  HCT 20.6* 23.8* --  NA 136 -- 134*  K 3.6 -- 5.0  CL 100 -- 99  CO2 27 -- 23  BUN 15 -- 35*  CREATININE 2.88* -- 4.55*  GLU -- -- --   Liver Panel  Basename 02/21/12 1458 02/20/12 1755  PROT -- --  ALBUMIN 1.3* 1.3*  AST -- --  ALT -- --  ALKPHOS -- --  BILITOT -- --  BILIDIR -- --  IBILI -- --    Microbiology: Recent Results (from the past 240 hour(s))  CULTURE, BLOOD (ROUTINE X 2)     Status: Normal (Preliminary result)   Collection Time   02/17/12  8:50 AM      Component Value Range Status Comment   Specimen Description BLOOD RIGHT ARM   Final    Special Requests     Final    Value: BOTTLES DRAWN AEROBIC AND ANAEROBIC 2CC BOTH BOTTLES   Culture  Setup Time [id number]   Final    Culture     Final    Value:        BLOOD CULTURE RECEIVED NO GROWTH TO DATE CULTURE WILL BE HELD FOR 5 DAYS BEFORE ISSUING A FINAL NEGATIVE REPORT   Report Status PENDING   Incomplete   WOUND CULTURE     Status: Normal  Collection Time   02/17/12 12:00 PM      Component Value Range Status Comment   Specimen Description ABSCESS PERIRECTAL   Final    Special Requests NONE   Final    Gram Stain     Final    Value: NO WBC SEEN     NO SQUAMOUS EPITHELIAL CELLS SEEN     NO ORGANISMS SEEN   Culture NO GROWTH 2 DAYS   Final    Report Status 02/20/2012 FINAL   Final   ANAEROBIC CULTURE     Status: Normal   Collection Time   02/17/12 12:00 PM      Component Value Range Status Comment   Specimen Description ABSCESS PERIRECTAL   Final    Special Requests NONE   Final    Gram Stain     Final    Value: FEW WBC PRESENT,BOTH PMN AND MONONUCLEAR     NO SQUAMOUS EPITHELIAL CELLS SEEN     NO ORGANISMS SEEN   Culture NO ANAEROBES ISOLATED   Final    Report Status 02/21/2012 FINAL   Final   MRSA PCR SCREENING     Status: Normal   Collection Time   02/17/12  1:57 PM      Component Value Range Status Comment   MRSA by PCR NEGATIVE   NEGATIVE Final   URINE CULTURE     Status: Normal   Collection Time   02/17/12  2:55 PM      Component Value Range Status Comment   Specimen Description URINE, CATHETERIZED   Final    Special Requests Immunocompromised   Final    Culture  Setup Time [id number]   Final    Colony Count NO GROWTH   Final    Culture NO GROWTH   Final    Report Status 02/18/2012 FINAL   Final   URINE CULTURE     Status: Normal   Collection Time   02/17/12  5:28 PM      Component Value Range Status Comment   Specimen Description URINE, CATHETERIZED   Final    Special Requests NONE   Final    Culture  Setup Time [id number]   Final    Colony Count NO GROWTH   Final    Culture NO GROWTH   Final    Report Status 02/18/2012 FINAL   Final   CULTURE, BLOOD (ROUTINE X 2)     Status: Normal (Preliminary result)   Collection Time   02/17/12  6:13 PM      Component Value Range Status Comment   Specimen Description BLOOD RIGHT ARM   Final    Special Requests BOTTLES DRAWN AEROBIC AND ANAEROBIC 10CC   Final    Culture  Setup Time [id number]   Final    Culture     Final    Value:        BLOOD CULTURE RECEIVED NO GROWTH TO DATE CULTURE WILL BE HELD FOR 5 DAYS BEFORE ISSUING A FINAL NEGATIVE REPORT   Report Status PENDING   Incomplete   CULTURE, BLOOD (ROUTINE X 2)     Status: Normal (Preliminary result)   Collection Time   02/17/12  6:30 PM      Component Value Range Status Comment   Specimen Description BLOOD LEFT HEMODIALYSIS CATHETER   Final    Special Requests BOTTLES DRAWN AEROBIC AND ANAEROBIC 10CC   Final    Culture  Setup Time [id number]   Final    Culture  Final    Value:        BLOOD CULTURE RECEIVED NO GROWTH TO DATE CULTURE WILL BE HELD FOR 5 DAYS BEFORE ISSUING A FINAL NEGATIVE REPORT   Report Status PENDING   Incomplete     Studies/Results: Dg Chest Port 1 View  02/22/2012  *RADIOLOGY REPORT*  Clinical Data: Pulmonary edema.  PORTABLE CHEST - 1 VIEW  Comparison: Chest x-ray 02/20/2012.   Findings: There is a right internal jugular perm catheter with tips terminating in the distal superior vena cava and superior aspect of the right atrium.  Previously noted left internal jugular central venous catheter has been removed.  No pneumothorax.  Lung volumes have decreased.  There are increasing bibasilar opacities favored to represent areas of atelectasis.  Minimal blunting of the left costophrenic sulcus could suggest a small left-sided pleural effusion.  Pulmonary venous congestion without frank pulmonary edema.  Mild cardiomegaly is unchanged.  Mediastinal contours are unremarkable.  Atherosclerotic calcifications within the arch of the aorta.  IMPRESSION: 1.  Support apparatus, as above. 2.  Low lung volumes with bibasilar subsegmental atelectasis and small left-sided pleural effusion. 3.  Mild cardiomegaly with mild pulmonary venous congestion.  No frank pulmonary edema at this time. 4.  Atherosclerosis.  Original Report Authenticated By: Florencia Reasons, M.D.     Assessment/Plan: Polymicrobial fournier's gangrene: will continue with piptazo alone and discontinue vanco. Would treat for a total of 14 days with IV then convert to an oral regimen.  HIV= continue on current regimen of kaletra, lamivudine, zidovudine renally dosed  Bryan Gilmore Infectious Diseases 02/22/2012, 4:26 PM

## 2012-02-22 NOTE — Progress Notes (Signed)
Wound is granulating in well Minimal pain Dialysis and medical care Irrigate wound with dressing changes

## 2012-02-23 ENCOUNTER — Inpatient Hospital Stay (HOSPITAL_COMMUNITY): Payer: BC Managed Care – PPO

## 2012-02-23 DIAGNOSIS — M1A00X0 Idiopathic chronic gout, unspecified site, without tophus (tophi): Secondary | ICD-10-CM | POA: Diagnosis not present

## 2012-02-23 DIAGNOSIS — N179 Acute kidney failure, unspecified: Secondary | ICD-10-CM | POA: Diagnosis not present

## 2012-02-23 DIAGNOSIS — J9600 Acute respiratory failure, unspecified whether with hypoxia or hypercapnia: Secondary | ICD-10-CM | POA: Diagnosis not present

## 2012-02-23 DIAGNOSIS — R339 Retention of urine, unspecified: Secondary | ICD-10-CM | POA: Diagnosis not present

## 2012-02-23 LAB — RENAL FUNCTION PANEL
BUN: 27 mg/dL — ABNORMAL HIGH (ref 6–23)
CO2: 26 mEq/L (ref 19–32)
Calcium: 8.3 mg/dL — ABNORMAL LOW (ref 8.4–10.5)
Creatinine, Ser: 5.13 mg/dL — ABNORMAL HIGH (ref 0.50–1.35)
Glucose, Bld: 96 mg/dL (ref 70–99)

## 2012-02-23 LAB — TYPE AND SCREEN
ABO/RH(D): B POS
Antibody Screen: NEGATIVE
Unit division: 0
Unit division: 0
Unit division: 0

## 2012-02-23 LAB — CULTURE, BLOOD (ROUTINE X 2)
Culture  Setup Time: 201306081325
Culture: NO GROWTH

## 2012-02-23 LAB — CBC
MCH: 29.3 pg (ref 26.0–34.0)
MCHC: 35.9 g/dL (ref 30.0–36.0)
Platelets: 110 10*3/uL — ABNORMAL LOW (ref 150–400)
RBC: 2.73 MIL/uL — ABNORMAL LOW (ref 4.22–5.81)
RDW: 15.5 % (ref 11.5–15.5)

## 2012-02-23 MED ORDER — PARICALCITOL 5 MCG/ML IV SOLN
5.0000 ug | INTRAVENOUS | Status: DC
  Administered 2012-02-26 – 2012-02-28 (×2): 5 ug via INTRAVENOUS
  Filled 2012-02-23 (×4): qty 1

## 2012-02-23 MED ORDER — PARICALCITOL 5 MCG/ML IV SOLN
INTRAVENOUS | Status: AC
  Filled 2012-02-23: qty 1

## 2012-02-23 NOTE — Procedures (Signed)
Patient seen on Hemodialysis. QB400, UF [id number] Treatment adjusted as needed.  Bryan Bills MD [facility]. Office # [phone] Pager # [phone] 10:01 AM

## 2012-02-23 NOTE — Progress Notes (Signed)
ANTIBIOTIC CONSULT NOTE - FOLLOW UP  Pharmacy Consult for Zosyn Indication: Fournier's Gangrene, perineal abscess  No Known Allergies  Patient Measurements: Height: 5\' 11"  (180.3 cm) Weight: 222 lb 7.1 oz (100.9 kg) IBW/kg (Calculated) : 75.3  Vital Signs: Temp: 98 F (36.7 C) (06/14 0343) Temp src: Oral (06/14 0343) BP: 114/68 mmHg (06/14 0600) Pulse Rate: 74  (06/14 0600) Intake/Output from previous day: 06/13 0701 - 06/14 0700 In: 535 [P.O.:240; I.V.:120; Blood:15; IV Piggyback:160] Out: 655 [Urine:55; Stool:600] Intake/Output from this shift:    Labs:  Basename 02/23/12 0333 02/22/12 1929 02/22/12 0340 02/21/12 1458 02/20/12 1755  WBC 11.9* 13.5* 13.7* -- --  HGB 8.0* 8.2* 7.3* -- --  PLT 110* 115* 89* -- --  LABCREA -- -- -- -- --  CREATININE -- -- 2.88* 4.55* 3.09*   Estimated Creatinine Clearance: 32.6 ml/min (by C-G formula based on Cr of 2.88).  Basename 02/21/12 1458  VANCOTROUGH --  Leodis Binet --  VANCORANDOM 22.7  GENTTROUGH --  GENTPEAK --  GENTRANDOM --  TOBRATROUGH --  TOBRAPEAK --  TOBRARND --  AMIKACINPEAK --  AMIKACINTROU --  AMIKACIN --     Microbiology: Recent Results (from the past 720 hour(s))  CULTURE, BLOOD (ROUTINE X 2)     Status: Normal (Preliminary result)   Collection Time   02/17/12  8:50 AM      Component Value Range Status Comment   Specimen Description BLOOD RIGHT ARM   Final    Special Requests     Final    Value: BOTTLES DRAWN AEROBIC AND ANAEROBIC 2CC BOTH BOTTLES   Culture  Setup Time [id number]   Final    Culture     Final    Value:        BLOOD CULTURE RECEIVED NO GROWTH TO DATE CULTURE WILL BE HELD FOR 5 DAYS BEFORE ISSUING A FINAL NEGATIVE REPORT   Report Status PENDING   Incomplete   WOUND CULTURE     Status: Normal   Collection Time   02/17/12 12:00 PM      Component Value Range Status Comment   Specimen Description ABSCESS PERIRECTAL   Final    Special Requests NONE   Final    Gram Stain     Final    Value:  NO WBC SEEN     NO SQUAMOUS EPITHELIAL CELLS SEEN     NO ORGANISMS SEEN   Culture NO GROWTH 2 DAYS   Final    Report Status 02/20/2012 FINAL   Final   ANAEROBIC CULTURE     Status: Normal   Collection Time   02/17/12 12:00 PM      Component Value Range Status Comment   Specimen Description ABSCESS PERIRECTAL   Final    Special Requests NONE   Final    Gram Stain     Final    Value: FEW WBC PRESENT,BOTH PMN AND MONONUCLEAR     NO SQUAMOUS EPITHELIAL CELLS SEEN     NO ORGANISMS SEEN   Culture NO ANAEROBES ISOLATED   Final    Report Status 02/21/2012 FINAL   Final   MRSA PCR SCREENING     Status: Normal   Collection Time   02/17/12  1:57 PM      Component Value Range Status Comment   MRSA by PCR NEGATIVE  NEGATIVE Final   URINE CULTURE     Status: Normal   Collection Time   02/17/12  2:55 PM      Component  Value Range Status Comment   Specimen Description URINE, CATHETERIZED   Final    Special Requests Immunocompromised   Final    Culture  Setup Time [id number]   Final    Colony Count NO GROWTH   Final    Culture NO GROWTH   Final    Report Status 02/18/2012 FINAL   Final   URINE CULTURE     Status: Normal   Collection Time   02/17/12  5:28 PM      Component Value Range Status Comment   Specimen Description URINE, CATHETERIZED   Final    Special Requests NONE   Final    Culture  Setup Time [id number]   Final    Colony Count NO GROWTH   Final    Culture NO GROWTH   Final    Report Status 02/18/2012 FINAL   Final   CULTURE, BLOOD (ROUTINE X 2)     Status: Normal (Preliminary result)   Collection Time   02/17/12  6:13 PM      Component Value Range Status Comment   Specimen Description BLOOD RIGHT ARM   Final    Special Requests BOTTLES DRAWN AEROBIC AND ANAEROBIC 10CC   Final    Culture  Setup Time [id number]   Final    Culture     Final    Value:        BLOOD CULTURE RECEIVED NO GROWTH TO DATE CULTURE WILL BE HELD FOR 5 DAYS BEFORE ISSUING A FINAL NEGATIVE REPORT   Report  Status PENDING   Incomplete   CULTURE, BLOOD (ROUTINE X 2)     Status: Normal (Preliminary result)   Collection Time   02/17/12  6:30 PM      Component Value Range Status Comment   Specimen Description BLOOD LEFT HEMODIALYSIS CATHETER   Final    Special Requests BOTTLES DRAWN AEROBIC AND ANAEROBIC 10CC   Final    Culture  Setup Time [id number]   Final    Culture     Final    Value:        BLOOD CULTURE RECEIVED NO GROWTH TO DATE CULTURE WILL BE HELD FOR 5 DAYS BEFORE ISSUING A FINAL NEGATIVE REPORT   Report Status PENDING   Incomplete     Anti-infectives     Start     Dose/Rate Route Frequency Ordered Stop   02/23/12 1000   lamiVUDine (EPIVIR) 10 MG/ML solution 50 mg        50 mg Oral Daily 02/22/12 1211     02/23/12 1000   Zidovudine (RETROVIR) 50 MG/5ML syrup 300 mg        300 mg Oral Daily 02/22/12 1219     02/21/12 1800   vancomycin (VANCOCIN) IVPB 1000 mg/200 mL premix  Status:  Discontinued        1,000 mg 200 mL/hr over 60 Minutes Intravenous Every M-W-F (Hemodialysis) 02/21/12 1713 02/22/12 1255   02/20/12 2200  piperacillin-tazobactam (ZOSYN) IVPB 2.25 g       2.25 g 100 mL/hr over 30 Minutes Intravenous 3 times per day 02/20/12 1355     02/19/12 1200   vancomycin (VANCOCIN) 1,500 mg in sodium chloride 0.9 % 500 mL IVPB  Status:  Discontinued        1,500 mg 250 mL/hr over 120 Minutes Intravenous Every 48 hours 02/17/12 1419 02/17/12 1617   02/19/12 1000   emtricitabine-tenofovir (TRUVADA) 200-300 MG per tablet 1 tablet  Status:  Discontinued  1 tablet Oral Every M-W-F 02/17/12 0820 02/17/12 1706   02/18/12 1700   lopinavir-ritonavir (KALETRA) 400-100 MG/5ML solution 400 mg        5 mL Oral 2 times daily with meals 02/18/12 1126     02/18/12 1400   clindamycin (CLEOCIN) IVPB 900 mg  Status:  Discontinued        900 mg 100 mL/hr over 30 Minutes Intravenous 3 times per day 02/18/12 1149 02/20/12 1200   02/18/12 1130   lamiVUDine (EPIVIR) 10 MG/ML solution 150  mg  Status:  Discontinued        150 mg Oral Daily 02/18/12 1126 02/22/12 1211   02/18/12 1130   Zidovudine (RETROVIR) 50 MG/5ML syrup 300 mg  Status:  Discontinued        300 mg Oral Every 12 hours 02/18/12 1126 02/22/12 1219   02/18/12 0800   vancomycin (VANCOCIN) IVPB 1000 mg/200 mL premix  Status:  Discontinued        1,000 mg 200 mL/hr over 60 Minutes Intravenous Every 24 hours 02/17/12 1619 02/20/12 1353   02/17/12 1800   piperacillin-tazobactam (ZOSYN) IVPB 3.375 g  Status:  Discontinued        3.375 g 100 mL/hr over 30 Minutes Intravenous 4 times per day 02/17/12 1619 02/20/12 1354   02/17/12 1300   piperacillin-tazobactam (ZOSYN) IVPB 2.25 g  Status:  Discontinued        2.25 g 100 mL/hr over 30 Minutes Intravenous 4 times per day 02/17/12 1214 02/17/12 1617   02/17/12 1300   vancomycin (VANCOCIN) IVPB 1000 mg/200 mL premix        1,000 mg 200 mL/hr over 60 Minutes Intravenous  Once 02/17/12 1255 02/17/12 1448   02/17/12 1000   acyclovir (ZOVIRAX) 200 MG capsule 400 mg  Status:  Discontinued        400 mg Oral 3 times daily 02/17/12 0820 02/17/12 0833   02/17/12 1000   raltegravir (ISENTRESS) tablet 400 mg  Status:  Discontinued        400 mg Oral 2 times daily 02/17/12 0820 02/17/12 1701   02/17/12 1000   acyclovir (ZOVIRAX) tablet 400 mg  Status:  Discontinued        400 mg Oral 3 times daily 02/17/12 0832 02/17/12 1749   02/17/12 0730  piperacillin-tazobactam (ZOSYN) IVPB 2.25 g       2.25 g 100 mL/hr over 30 Minutes Intravenous  Once 02/17/12 0716 02/17/12 0951   02/17/12 0730   vancomycin (VANCOCIN) IVPB 1000 mg/200 mL premix        1,000 mg 200 mL/hr over 60 Minutes Intravenous  Once 02/17/12 0716 02/17/12 0854          Assessment: 61 YOM with Fournier's gangrene/perineal abscess on day#7/14 of Zosyn (stopped vancomycin/clindamycin). WBC trending down. Afebrile.   Micro: Blood 6/8>> NGTD Abscess 6/8>> negative Urine 6/8>> negative MRSA pcr 6/8  Negative  Goal of Therapy:  Clinical Improvement  Plan:  1. Continue Zosyn 2.25g IV q8h. 2. Follow-up cultures and toleration of HD.   Link Snuffer, PharmD, BCPS Clinical Pharmacist [phone] 02/23/2012,7:34 AM

## 2012-02-23 NOTE — Progress Notes (Signed)
Physical Therapy Treatment Patient Details Name: Bryan Gilmore MRN: [id number] DOB: 1951/04/05 Today's Date: 02/23/2012 Time: 7829-5621 PT Time Calculation (min): 25 min  PT Assessment / Plan / Recommendation Comments on Treatment Session  needed extra encouragement to participate.  Gait mildly unsteady with only fair  use of the RW,    Follow Up Recommendations  Home health PT;Supervision/Assistance - 24 hour    Barriers to Discharge        Equipment Recommendations  Other (comment)    Recommendations for Other Services    Frequency Min 3X/week   Plan Discharge plan remains appropriate    Precautions / Restrictions     Pertinent Vitals/Pain     Mobility  Bed Mobility Bed Mobility: Supine to Sit;Sitting - Scoot to Edge of Bed Supine to Sit: 4: Min guard Sitting - Scoot to [place] of Bed: 4: Min guard Details for Bed Mobility Assistance: vc's for safety Transfers Transfers: Sit to Stand;Stand to Sit Sit to Stand: 4: Min assist Stand to Sit: 4: Min assist Details for Transfer Assistance: vc's for hand placement; mild lifting assist Ambulation/Gait Ambulation/Gait Assistance: 4: Min assist Ambulation Distance (Feet): 110 Feet Assistive device: Rolling walker Ambulation/Gait Assistance Details: vc's for postural checks and to keep closer/use RW safer Gait Pattern: Step-through pattern;Decreased stride length;Trunk flexed Gait velocity: variable and not well controlled Stairs: No    Exercises     PT Diagnosis:    PT Problem List:   PT Treatment Interventions:     PT Goals Acute Rehab PT Goals PT Goal Formulation: Patient unable to participate in goal setting Time For Goal Achievement: 03/06/12 Potential to Achieve Goals: Good PT Goal: Supine/Side to Sit - Progress: Progressing toward goal PT Goal: Sit to Stand - Progress: Progressing toward goal PT Goal: Stand to Sit - Progress: Progressing toward goal PT Goal: Ambulate - Progress: Progressing toward goal  Visit  Information  Last PT Received On: 02/23/12 Assistance Needed: +2 (lines)    Subjective Data  Subjective: oh, I just got down to rest   Cognition  Overall Cognitive Status: Appears within functional limits for tasks assessed/performed Arousal/Alertness: Awake/alert Behavior During Session: [facility] for tasks performed Safety/Judgement: Decreased awareness of safety precautions;Decreased safety judgement for tasks assessed;Impulsive Problem Solving: Pt slow to process at times    Balance     End of Session PT - End of Session Activity Tolerance: Patient tolerated treatment well Patient left: in chair;with call bell/phone within reach;with family/visitor present Nurse Communication: Mobility status    Achsah Mcquade, Eliseo Gum 02/23/2012, 4:46 PM  02/23/2012  [place] Bing, PT [phone] (pager)

## 2012-02-23 NOTE — Progress Notes (Signed)
[facility] Progress Note    Subjective:   No acute complaints overnight- Bleeding appears to be slower   Objective:   BP 138/75  Pulse 74  Temp 97.7 F (36.5 C) (Oral)  Resp 10  Ht 5\' 11"  (1.803 m)  Wt 100.6 kg (221 lb 12.5 oz)  BMI 30.93 kg/m2  SpO2 100%  Intake/Output Summary (Last 24 hours) at 02/23/12 0958 Last data filed at 02/23/12 0600  Gross per 24 hour  Intake    415 ml  Output    655 ml  Net   -240 ml   Weight change: -5 kg (-11 lb 0.4 oz)  Physical Exam: EXB:[id number] on HD UVO:ZDGUY RRR, S1 and S2 with ESM Resp:CTA bilaterally, no rales QIH:KVQQ, flat, NT, BS normal Ext:LE edema  Imaging: Dg Chest Port 1 View  02/22/2012  *RADIOLOGY REPORT*  Clinical Data: Pulmonary edema.  PORTABLE CHEST - 1 VIEW  Comparison: Chest x-ray 02/20/2012.  Findings: There is a right internal jugular perm catheter with tips terminating in the distal superior vena cava and superior aspect of the right atrium.  Previously noted left internal jugular central venous catheter has been removed.  No pneumothorax.  Lung volumes have decreased.  There are increasing bibasilar opacities favored to represent areas of atelectasis.  Minimal blunting of the left costophrenic sulcus could suggest a small left-sided pleural effusion.  Pulmonary venous congestion without frank pulmonary edema.  Mild cardiomegaly is unchanged.  Mediastinal contours are unremarkable.  Atherosclerotic calcifications within the arch of the aorta.  IMPRESSION: 1.  Support apparatus, as above. 2.  Low lung volumes with bibasilar subsegmental atelectasis and small left-sided pleural effusion. 3.  Mild cardiomegaly with mild pulmonary venous congestion.  No frank pulmonary edema at this time. 4.  Atherosclerosis.  Original Report Authenticated By: Florencia Reasons, M.D.    Labs: BMET  Lab 02/23/12 0802 02/22/12 0340 02/21/12 1458 02/20/12 1755 02/20/12 0500 02/19/12 1718 02/19/12 0500 02/18/12 1858  NA  136 136 134* 134* 136 136 136 --  K 3.7 3.6 5.0 4.3 4.2 4.0 4.1 --  CL 98 100 99 101 101 99 97 --  CO2 26 27 23 24 28 28 30  --  GLUCOSE 96 128* 105* 135* 114* 105* 98 --  BUN 27* 15 35* 26* 23 29* 44* --  CREATININE 5.13* 2.88* 4.55* 3.09* 2.16* 2.42* 3.02* --  ALB -- -- -- -- -- -- -- --  CALCIUM 8.3* 7.9* 7.8* 7.7* 7.9* 8.2* 8.4 --  PHOS 5.0* -- 5.6* 3.5 3.1 3.5 4.4 5.2*   CBC  Lab 02/23/12 0333 02/22/12 1929 02/22/12 0340 02/21/12 2038 02/17/12 1830 02/17/12 0616  WBC 11.9* 13.5* 13.7* 15.0* -- --  NEUTROABS -- -- -- -- 25.8* 25.5*  HGB 8.0* 8.2* 7.3* 8.3* -- --  HCT 22.3* 23.6* 20.6* 23.8* -- --  MCV 81.7 81.4 80.8 82.9 -- --  PLT 110* 115* 89* 91* -- --    Medications:      . antiseptic oral rinse  15 mL Mouth Rinse BID  . darbepoetin (ARANESP) injection - DIALYSIS  100 mcg Intravenous Q Wed-HD  . lamiVUDine  50 mg Oral Daily  . lopinavir-ritonavir  5 mL Oral BID WC  . pantoprazole (PROTONIX) IV  40 mg Intravenous QHS  . paricalcitol      . paricalcitol  1 mcg Oral Daily  . piperacillin-tazobactam (ZOSYN)  IV  2.25 g Intravenous Q8H  . Zidovudine  300 mg Oral Daily  . DISCONTD: lamiVUDine  150 mg Oral Daily  . DISCONTD: vancomycin  1,000 mg Intravenous Q M,W,F-HD  . DISCONTD: Zidovudine  300 mg Oral Q12H     Assessment/ Plan:   1. Acute on chronic renal failure IV-  progressed to ESRD. Continue HD on MWF schedule and initiate process to have placed in OP HD unit. From his vein mapping, it appears that AVG is his only option- discussed with Dr. Edilia Bo- this will be done after resolution of leukocytosis/improved wound healing. 2. Anemia- stable s/p PRBC transfusion- monitor with perineal wound site losses. 3. Fournier's gangrene- s/p I&D, seen by ID and recommendations noted 4. Hep C/HIV- on ARVs  5. AMS - Improved with HD/wound management  Zetta Bills, MD 02/23/2012, 9:58 AM

## 2012-02-23 NOTE — Progress Notes (Signed)
[id number]. To unit 6700 per bed accompanied by 2 RN's alert and oriented pt. In room air. No skin issues except for surgical incision rt groin  with dressing dry and intact ; intact foley catheter and flexiseal. Pt was accompanied by sister as well. Oriented to unit set up

## 2012-02-23 NOTE — Progress Notes (Signed)
Name: Bryan Gilmore MRN: [id number] DOB: 03-14-1951    LOS: 6  Referring Provider:  Urology Reason for Referral:  Respiratory failure and acidosis.  PULMONARY / CRITICAL CARE MEDICINE  HPI:  61 year old male with HIV PMH presented to [facility] with 1 wk history of testicular discomfort and swelling, dysuria and difficulty urinating. In ER was noted to have Fournier gangrene and perineal abscesses and was taken to the OR for surgical correction 6/8.  Patient was also noted to have evolving renal failure and profound acidosis.  Renal was consulted and recommended CVVH and pt was tx to [facility] under PCCM.   Subjective/Overnight: 6/12- pulled out HD catheter on L. Still has permcath on R 6/13- in icu, some bleeding resolved 6/14- HD, normal BP  Vital Signs: Temp:  [97.7 F (36.5 C)-98.8 F (37.1 C)] 98 F (36.7 C) (06/14 1200) Pulse Rate:  [25-100] 79  (06/14 1200) Resp:  [10-21] 13  (06/14 1200) BP: (99-152)/(60-87) 117/68 mmHg (06/14 1200) SpO2:  [95 %-100 %] 100 % (06/14 1200) Weight:  [97.9 kg (215 lb 13.3 oz)-100.9 kg (222 lb 7.1 oz)] 97.9 kg (215 lb 13.3 oz) (06/14 1126)  Physical Examination: General: alert, nonfocal Neuro: more awake, alert  CV: s1s2 rrr PULM: resps even non labored GI: abd soft, +bs Extremities: warm and dry, no edema  Wound dressed, wet / dry   Principal Problem:  *Sepsis secondary to suspected Fornier's gangrene Active Problems:  HIV INFECTION  ARF (acute renal failure) in the setting of stage III CKD  Metabolic acidosis  Hyperkalemia  Normocytic anemia  Hyponatremia  Acute respiratory failure   ASSESSMENT AND PLAN  PULMONARY  Lab 02/22/12 1308 02/18/12 2007 02/18/12 0525 02/17/12 1757 02/17/12 1242  PHART 7.536* 7.551* -- 7.298* 7.129*  PCO2ART 31.1* 36.4 -- 26.1* 24.9*  PO2ART 93.0 152.0* -- 216.0* 195.0*  HCO3 26.5* 32.1* 27.0* 12.7* 7.9*  O2SAT 98.0 100.0 71.0 100.0 98.4   Ventilator Settings:  CXR:  6/9>> int prom  ETT:  none  A:   Acute respiratory distress - respiratory compensation in setting metabolic acidosis Hemoptysis resolved 6/10 P:   abg wnl, normal neurostatus Reduce O2 needs PT when able with wound  CARDIOVASCULAR  Lab 02/17/12 1830 02/17/12 0651  TROPONINI -- --  LATICACIDVEN 1.1 1.7  PROBNP 42513.0* --   ECG:  NSR Lines:  L IJ HD cath 6/8>>>pt pulled out 6/12 Rt subclav perm cath  6/11>>>  A: SIRS - likely r/t suspected Fournier's gangrene with peroneal and perirectal abscesses.  P:  Remains nomrmotensive Tele Rate controlled fib S/p one unit prbc 6/13 Cbc in am    RENAL  Lab 02/23/12 0802 02/22/12 0340 02/21/12 1458 02/20/12 1755 02/20/12 0500 02/19/12 1718 02/19/12 0500 02/18/12 0500 02/17/12 1830  NA 136 136 134* 134* 136 -- -- -- --  K 3.7 3.6 -- -- -- -- -- -- --  CL 98 100 99 101 101 -- -- -- --  CO2 26 27 23 24 28  -- -- -- --  BUN 27* 15 35* 26* 23 -- -- -- --  CREATININE 5.13* 2.88* 4.55* 3.09* 2.16* -- -- -- --  CALCIUM 8.3* 7.9* 7.8* 7.7* 7.9* -- -- -- --  MG -- -- -- -- 2.4 -- 2.4 2.1 2.2  PHOS 5.0* -- 5.6* 3.5 3.1 3.5 -- -- --   Intake/Output      06/13 0701 - 06/14 0700 06/14 0701 - 06/15 0700   P.O. 240    I.V. (mL/kg)  120 (1.2)    Blood 15    IV Piggyback 160    Total Intake(mL/kg) 535 (5.3)    Urine (mL/kg/hr) 55 (0)    Other  3000   Stool 600    Total Output 655 3000   Net -120 -3000         Foley:  6/8  A:  Acute on chronic renal failure - ?progression of underlying disease (prob stage IV CKD with baseline SCr 2.47-2.78) c/b ATN in setting SIRS, volume depletion, ?drug toxicity.  Metabolic acidosis  P:   HD today  GASTROINTESTINAL  Lab 02/23/12 0802 02/21/12 1458 02/20/12 1755 02/20/12 0500 02/19/12 1718 02/17/12 1830 02/17/12 0850  AST -- -- -- -- -- 23 22  ALT -- -- -- -- -- 10 12  ALKPHOS -- -- -- -- -- 197* 105  BILITOT -- -- -- -- -- 0.8 0.6  PROT -- -- -- -- -- 6.3 7.5  ALBUMIN 1.4* 1.3* 1.3* 1.5* 1.8* -- --    A:  No acute issues  P:    Renal diet ppi  HEMATOLOGIC  Lab 02/23/12 0333 02/22/12 1929 02/22/12 0340 02/21/12 2038 02/21/12 1458 02/21/12 0440 02/20/12 0500 02/19/12 0500 02/18/12 0500 02/17/12 1830  HGB 8.0* 8.2* 7.3* 8.3* 5.5* -- -- -- -- --  HCT 22.3* 23.6* 20.6* 23.8* 15.4* -- -- -- -- --  PLT 110* 115* 89* 91* 97* -- -- -- -- --  INR -- -- -- -- -- 1.37 -- -- 1.40 1.53*  APTT -- -- -- -- -- 35 33 33 32 33   A:  Anemia - ?r/t hemoptysis.  Coags wnl.  P:  No further bleeding ddavp if bleed Plat rising on won with neg balance Neg balance, cbc in am   INFECTIOUS  Lab 02/23/12 0333 02/22/12 1929 02/22/12 0340 02/21/12 2038 02/21/12 1458 02/17/12 1830 02/17/12 0620  WBC 11.9* 13.5* 13.7* 15.0* 17.8* -- --  PROCALCITON -- -- -- -- -- 13.97 10.04   Cultures: BCx2 6/8>>> Urine 6/8>>> Wound (perirectal abscess) 6/8>>>  Antibiotics:  Per ID svc Zosyn 6/8>>> Vancomycin 6/8>>> clinda 6/9>>>  A:  Fournier's gangrene - s/p I/D 6/8.      HIV - last CD4 =440 04/17/11 P:   Cont HIV rx  Consider dc vanc, done, ID agreed Continue empiric course zosyn, total for 14 days  ENDOCRINE  A:   No acute issues , low at times P:   cbg with chem  NEUROLOGIC  A:  AMS - in setting SIRS and acute on chronic renal failure. Confused int  P:   Supportive care  abg wnl, neuro resolved  BEST PRACTICE / DISPOSITION Level of Care:  ICU, consider transfer to tele, triad Primary Service:  PCCM Consultants:  Renal, ID, urology  Code Status:  full Diet:  NPO DVT Px:  SCD's GI Px:  protonix Skin Integrity:  Peroneal wounds/ surgical incisions  Social / Family:  No family available   Mcarthur Rossetti. Tyson Alias, MD, FACP Pgr: [phone] [facility]

## 2012-02-24 DIAGNOSIS — A413 Sepsis due to Hemophilus influenzae: Secondary | ICD-10-CM

## 2012-02-24 DIAGNOSIS — L03818 Cellulitis of other sites: Secondary | ICD-10-CM

## 2012-02-24 DIAGNOSIS — B20 Human immunodeficiency virus [HIV] disease: Secondary | ICD-10-CM

## 2012-02-24 DIAGNOSIS — L02818 Cutaneous abscess of other sites: Secondary | ICD-10-CM

## 2012-02-24 DIAGNOSIS — N186 End stage renal disease: Secondary | ICD-10-CM

## 2012-02-24 LAB — GLUCOSE, CAPILLARY
Glucose-Capillary: 102 mg/dL — ABNORMAL HIGH (ref 70–99)
Glucose-Capillary: 94 mg/dL (ref 70–99)
Glucose-Capillary: 103 mg/dL — ABNORMAL HIGH (ref 70–99)

## 2012-02-24 LAB — CULTURE, BLOOD (ROUTINE X 2)
Culture  Setup Time: 201306090505
Culture: NO GROWTH

## 2012-02-24 MED ORDER — PANTOPRAZOLE SODIUM 40 MG PO PACK
40.0000 mg | PACK | Freq: Every day | ORAL | Status: DC
  Administered 2012-02-24 – 2012-03-01 (×7): 40 mg via ORAL
  Filled 2012-02-24 (×7): qty 20

## 2012-02-24 MED ORDER — SODIUM CHLORIDE 0.9 % IR SOLN
1000.0000 mL | Freq: Two times a day (BID) | Status: DC
  Administered 2012-02-24 – 2012-03-01 (×10): 1000 mL

## 2012-02-24 NOTE — Progress Notes (Signed)
DAILY PROGRESS NOTE                              GENERAL INTERNAL MEDICINE [facility]  SUBJECTIVE: Denies any pain, denies any fever or chills.  OBJECTIVE: BP 137/79  Pulse 77  Temp 98.7 F (37.1 C) (Oral)  Resp 20  Ht 5\' 11"  (1.803 m)  Wt 101.4 kg (223 lb 8.7 oz)  BMI 31.18 kg/m2  SpO2 100%  Intake/Output Summary (Last 24 hours) at 02/24/12 1150 Last data filed at 02/24/12 0700  Gross per 24 hour  Intake    550 ml  Output      0 ml  Net    550 ml                      Weight change: -0.3 kg (-10.6 oz) Physical Exam: General: Alert and awake oriented x3 not in any acute distress. HEENT: anicteric sclera, pupils equal reactive to light and accommodation CVS: S1-S2 heard, no murmur rubs or gallops Chest: clear to auscultation bilaterally, no wheezing rales or rhonchi Abdomen:  normal bowel sounds, soft, nontender, nondistended, no organomegaly Neuro: Cranial nerves II-XII intact, no focal neurological deficits Extremities: no cyanosis, no clubbing or edema noted bilaterally   Lab Results:  Basename 02/23/12 0802 02/22/12 0340 02/21/12 1458  NA 136 136 --  K 3.7 3.6 --  CL 98 100 --  CO2 26 27 --  GLUCOSE 96 128* --  BUN 27* 15 --  CREATININE 5.13* 2.88* --  CALCIUM 8.3* 7.9* --  MG -- -- --  PHOS 5.0* -- 5.6*    Basename 02/23/12 0802 02/21/12 1458  AST -- --  ALT -- --  ALKPHOS -- --  BILITOT -- --  PROT -- --  ALBUMIN 1.4* 1.3*   No results found for this basename: LIPASE:2,AMYLASE:2 in the last 72 hours  Basename 02/23/12 0333 02/22/12 1929  WBC 11.9* 13.5*  NEUTROABS -- --  HGB 8.0* 8.2*  HCT 22.3* 23.6*  MCV 81.7 81.4  PLT 110* 115*   No results found for this basename: CKTOTAL:3,CKMB:3,CKMBINDEX:3,TROPONINI:3 in the last 72 hours No components found with this basename: POCBNP:3 No results found for this basename: DDIMER:2 in the last 72 hours No results found for this basename: HGBA1C:2 in the last 72 hours No results found for this  basename: CHOL:2,HDL:2,LDLCALC:2,TRIG:2,CHOLHDL:2,LDLDIRECT:2 in the last 72 hours No results found for this basename: TSH,T4TOTAL,FREET3,T3FREE,THYROIDAB in the last 72 hours No results found for this basename: VITAMINB12:2,FOLATE:2,FERRITIN:2,TIBC:2,IRON:2,RETICCTPCT:2 in the last 72 hours  Micro Results: Recent Results (from the past 240 hour(s))  CULTURE, BLOOD (ROUTINE X 2)     Status: Normal   Collection Time   02/17/12  8:50 AM      Component Value Range Status Comment   Specimen Description BLOOD RIGHT ARM   Final    Special Requests     Final    Value: BOTTLES DRAWN AEROBIC AND ANAEROBIC 2CC BOTH BOTTLES   Culture  Setup Time [id number]   Final    Culture NO GROWTH 5 DAYS   Final    Report Status 02/23/2012 FINAL   Final   WOUND CULTURE     Status: Normal   Collection Time   02/17/12 12:00 PM      Component Value Range Status Comment   Specimen Description ABSCESS PERIRECTAL   Final    Special Requests NONE   Final    Gram  Stain     Final    Value: NO WBC SEEN     NO SQUAMOUS EPITHELIAL CELLS SEEN     NO ORGANISMS SEEN   Culture NO GROWTH 2 DAYS   Final    Report Status 02/20/2012 FINAL   Final   ANAEROBIC CULTURE     Status: Normal   Collection Time   02/17/12 12:00 PM      Component Value Range Status Comment   Specimen Description ABSCESS PERIRECTAL   Final    Special Requests NONE   Final    Gram Stain     Final    Value: FEW WBC PRESENT,BOTH PMN AND MONONUCLEAR     NO SQUAMOUS EPITHELIAL CELLS SEEN     NO ORGANISMS SEEN   Culture NO ANAEROBES ISOLATED   Final    Report Status 02/21/2012 FINAL   Final   MRSA PCR SCREENING     Status: Normal   Collection Time   02/17/12  1:57 PM      Component Value Range Status Comment   MRSA by PCR NEGATIVE  NEGATIVE Final   URINE CULTURE     Status: Normal   Collection Time   02/17/12  2:55 PM      Component Value Range Status Comment   Specimen Description URINE, CATHETERIZED   Final    Special Requests Immunocompromised    Final    Culture  Setup Time [id number]   Final    Colony Count NO GROWTH   Final    Culture NO GROWTH   Final    Report Status 02/18/2012 FINAL   Final   URINE CULTURE     Status: Normal   Collection Time   02/17/12  5:28 PM      Component Value Range Status Comment   Specimen Description URINE, CATHETERIZED   Final    Special Requests NONE   Final    Culture  Setup Time [id number]   Final    Colony Count NO GROWTH   Final    Culture NO GROWTH   Final    Report Status 02/18/2012 FINAL   Final   CULTURE, BLOOD (ROUTINE X 2)     Status: Normal   Collection Time   02/17/12  6:13 PM      Component Value Range Status Comment   Specimen Description BLOOD RIGHT ARM   Final    Special Requests BOTTLES DRAWN AEROBIC AND ANAEROBIC 10CC   Final    Culture  Setup Time [id number]   Final    Culture NO GROWTH 5 DAYS   Final    Report Status 02/24/2012 FINAL   Final   CULTURE, BLOOD (ROUTINE X 2)     Status: Normal   Collection Time   02/17/12  6:30 PM      Component Value Range Status Comment   Specimen Description BLOOD LEFT HEMODIALYSIS CATHETER   Final    Special Requests BOTTLES DRAWN AEROBIC AND ANAEROBIC 10CC   Final    Culture  Setup Time [id number]   Final    Culture NO GROWTH 5 DAYS   Final    Report Status 02/24/2012 FINAL   Final     Studies/Results: Ct Abdomen Wo Contrast  02/17/2012  *RADIOLOGY REPORT*  Clinical Data:  Testicular and flank pain, urinary retention, immunosuppressed.  CT ABDOMEN AND PELVIS WITHOUT CONTRAST  Technique:  Multidetector CT imaging of the abdomen and pelvis was performed following the standard protocol without intravenous contrast.  Comparison:  07/13/2010 ultrasound  Findings:  Cardiomegaly.  Coronary artery calcification.  Low attenuation of the blood pool suggests anemia.  Lung bases are clear.  Degraded by patient motion.  Organ abnormality/lesion detection is limited in the absence of intravenous contrast. Within this limitation, unremarkable  liver, spleen, pancreas, adrenal glands. Distended gallbladder.  No pericholecystic fluid or radiodense gallstones. No biliary ductal dilatation.  Atrophic kidneys with perinephric fat stranding.  Focal lesion detection is significantly limited given the motion.  There is suggestion of a 1.9 cm interpolar left renal lesion with thin peripheral calcification (image 26 of series 2).  No hydronephrosis or hydroureter.  No bowel obstruction.  Normal appendix.  Circumferential bladder wall thickening, nonspecific given incomplete distension.  Vas deferens calcifications suggest diabetes.  There is scattered atherosclerotic calcification of the aorta and its branches. No aneurysmal dilatation.  There is peri-anal gas and a more focal pocket measuring 3.2 x 1.3 cm on series 2 image 80. Air tracks superiorly along the left iliac vessels and left para-aortic retroperitoneum.  Inferiorly, extends throughout the perineum, the medial right thigh, the right hemiscrotum and penis, partially imaged.  IMPRESSION: Findings are concerning for Fournier gangrene, with gas noted to track along soft tissues of the penis, right hemiscrotum, perineum, right thigh, and left retroperitoneum.  There is a perianal gas collection which may reflect a perianal abscess as the potential source of infection. Emergent urologic consultation recommended.  Critical Value/emergent results were called by telephone at the time of interpretation on February 17, 2012  at 06:15 a.m.  to  Dr. Read Drivers, who verbally acknowledged these results.  1.9 cm interpolar left renal lesion with thin peripheral calcification. Recommend ultrasound follow-up.  Original Report Authenticated By: Waneta Martins, M.D.   US Renal Port  02/18/2012  *RADIOLOGY REPORT*  Clinical Data: Acute renal failure.  History of hypertension and HIV infection.  Question hydronephrosis.  RENAL/URINARY TRACT ULTRASOUND COMPLETE - PORTABLE  Comparison:  Abdominal CT 02/17/2012.  Abdominal  ultrasound 07/13/2010.  Findings:  Right Kidney:  There is cortical thinning with diffusely increased echogenicity.  No hydronephrosis or focal renal abnormality is identified.  Renal length is 8.3 cm.  Left Kidney:  There is cortical thinning with diffusely increased echogenicity.  No hydronephrosis is present.  There is a small cyst in the interpolar region measuring 12 mm maximally.  Renal length is 9.7 cm.  Bladder:  Decompressed by Foley catheter.  IMPRESSION:  1.  Stable renal ultrasound with bilaterally increased echogenicity consistent with chronic medical renal disease. 2.  No hydronephrosis.  Original Report Authenticated By: Gerrianne Scale, M.D.   Dg Chest Port 1 View  02/22/2012  *RADIOLOGY REPORT*  Clinical Data: Pulmonary edema.  PORTABLE CHEST - 1 VIEW  Comparison: Chest x-ray 02/20/2012.  Findings: There is a right internal jugular perm catheter with tips terminating in the distal superior vena cava and superior aspect of the right atrium.  Previously noted left internal jugular central venous catheter has been removed.  No pneumothorax.  Lung volumes have decreased.  There are increasing bibasilar opacities favored to represent areas of atelectasis.  Minimal blunting of the left costophrenic sulcus could suggest a small left-sided pleural effusion.  Pulmonary venous congestion without frank pulmonary edema.  Mild cardiomegaly is unchanged.  Mediastinal contours are unremarkable.  Atherosclerotic calcifications within the arch of the aorta.  IMPRESSION: 1.  Support apparatus, as above. 2.  Low lung volumes with bibasilar subsegmental atelectasis and small left-sided pleural effusion. 3.  Mild  cardiomegaly with mild pulmonary venous congestion.  No frank pulmonary edema at this time. 4.  Atherosclerosis.  Original Report Authenticated By: Florencia Reasons, M.D.   Dg Chest Port 1 View  02/20/2012  *RADIOLOGY REPORT*  Clinical Data: Status post dialysis catheter placement.  PORTABLE CHEST - 1  VIEW  Comparison: Chest radiograph 02/20/2012 at 5:14 hours.  Findings: Current radiograph at 10:26 hours.  A right IJ dialysis catheter has been placed.  One of the catheter tips is in the mid to distal superior vena cava, and the other catheter tip is at the junction of the superior vena cava and right atrium.  Left IJ dialysis catheter is stable, terminating near the junction of the innominate vein and superior vena cava.  Stable cardiomegaly. Pulmonary vascularity appears normal.  Aeration of both lungs is improved compared to the chest radiograph performed earlier today. Decreasing left basilar atelectasis compared to earlier today. Negative for pulmonary edema.  Negative for pneumothorax.  IMPRESSION: Satisfactory position of right IJ dialysis catheter.  Negative for pneumothorax. Improved aeration of the lungs compared to most recent prior.  Original Report Authenticated By: Britta Mccreedy, M.D.   Dg Chest Port 1 View  02/20/2012  *RADIOLOGY REPORT*  Clinical Data: Hemoptysis and shortness of breath.  PORTABLE CHEST - 1 VIEW  Comparison: 02/19/2012  Findings: Left jugular dialysis catheter tip in the left innominate vein region.  Hazy densities at the left lung base.  Negative for a pneumothorax.  There are low lung volumes and the trachea is midline. Heart size is upper limits of normal.  IMPRESSION: Low lung volumes and probable left basilar atelectasis.  Dialysis catheter tip in the left innominate vein.  This catheter positioning is not optimal and may result in poor flows.  Original Report Authenticated By: Richarda Overlie, M.D.   Dg Chest Portable 1 View  02/19/2012  *RADIOLOGY REPORT*  Clinical Data: Hemoptysis.  PORTABLE CHEST - 1 VIEW  Comparison: Chest x-ray 02/18/2012.  Findings: There is a left-sided internal jugular central venous catheter with tip terminating in the near the confluence of the left innominate and superior vena cava. Lung volumes are low.  No consolidative airspace disease.  No  definite pleural effusions. Pulmonary venous congestion without frank pulmonary edema.  Mild enlargement of the cardiopericardial silhouette, increased compared to prior examination. The patient is rotated to the right on today's exam, resulting in distortion of the mediastinal contours and reduced diagnostic sensitivity and specificity for mediastinal pathology.  Atherosclerotic calcifications within the arch of the aorta.  IMPRESSION: 1.  Support apparatus, as above. 2.  Low lung volumes with increasing enlargement of the cardiopericardial silhouette (which may reflect increasing cardiac dilatation and/or the presence of pericardial effusion) and pulmonary venous congestion (without frank pulmonary edema). 3.  Atherosclerosis.  Original Report Authenticated By: Florencia Reasons, M.D.   Dg Chest Portable 1 View  02/18/2012  *RADIOLOGY REPORT*  Clinical Data: Follow up hemoptysis.  PORTABLE CHEST - 1 VIEW  Comparison: 02/18/2012  Findings: Left jugular central line tip is near the upper SVC. Heart size is upper limits of normal.  Few densities at the left lung base are suggestive for atelectasis.  Remainder of the lungs are clear.  No evidence for a pneumothorax.  Trachea is midline.  IMPRESSION:  Left basilar atelectasis.  Stable position of the central line.  Original Report Authenticated By: Richarda Overlie, M.D.   Dg Chest Port 1 View  02/18/2012  *RADIOLOGY REPORT*  Clinical Data: Respiratory failure  PORTABLE  CHEST - 1 VIEW  Comparison: 02/17/2012  Findings: Left IJ catheter terminates at the junction of the left brachiocephalic vein and superior vena cava, directed laterally. Cardiomegaly is stable.  Lung volumes are low.  There is mild peribronchial thickening at the lung bases.  Otherwise, the lungs appear clear.  No visible pleural effusion or pneumothorax.  IMPRESSION: Mild peribronchial thickening at the lung bases.  Otherwise, the lungs appear clear.  Original Report Authenticated By: Britta Mccreedy, M.D.    Dg Chest Port 1 View  02/17/2012  *RADIOLOGY REPORT*  Clinical Data: Line placement  PORTABLE CHEST - 1 VIEW  Comparison: 02/17/2012 at 1536 hours  Findings: Left IJ dual lumen catheter with its tip at the junction of the left brachiocephalic vein and SVC.  Lungs are essentially clear. No pleural effusion or pneumothorax.  Mild cardiomegaly.  IMPRESSION: Left IJ dual lumen catheter with its tip at the junction of the left brachiocephalic vein and SVC.  No pneumothorax.  Original Report Authenticated By: Charline Bills, M.D.   Dg Chest Port 1 View  02/17/2012  *RADIOLOGY REPORT*  Clinical Data: Pulmonary edema  PORTABLE CHEST - 1 VIEW  Comparison: None.  Findings: Cardiomediastinal silhouette is unremarkable.  No acute infiltrate or pleural effusion.  No pulmonary edema.  Bony thorax is unremarkable.  IMPRESSION: No active disease.  Original Report Authenticated By: Natasha Mead, M.D.   Dg Fluoro Guide Cv Line-no Report  02/20/2012  CLINICAL DATA: diatec insertion, right   FLOURO GUIDE CV LINE  Fluoroscopy was utilized by the requesting physician.  No radiographic  interpretation.     Dg C-arm 1-60 Min  02/17/2012  *RADIOLOGY REPORT*  Clinical Data:  Fournier's gangrene  RETROGRADE URETHROGRAM  Comparison:  None.  Fluoroscopy Time:  45 seconds  This exam was performed by Dr. Sherron Monday.  Findings:  Two spot fluoroscopic views of the lower pelvis were performed.  On image #1, there is a small-bore catheter projecting over the penis, with contrast in the urinary bladder.  Bladder is normal in shape. Contrast is seen with an irregular shape, projecting over the expected location of the penis.  It is uncertain if this is some contrast extravasation within the soft tissues of the penis, distal urethral injury, or contrast contamination outside of the patient.  The second image shows a larger bore Foley catheter satisfactorily positioned, with the balloon inflated in the lower urinary bladder and contrast within  the urinary bladder.  The previously described contrast extravasation over the penile region is no longer present. Question if it could be in a contamination adjacent to the patient. Numerous irregular locules of gas project over the perineum, consistent with the Fournier's gangrene seen by CT.  IMPRESSION: Two intraoperative images are submitted as described above.  Please also see Dr. Mina Marble report.  Original Report Authenticated By: Britta Mccreedy, M.D.   Medications: Scheduled Meds:   . antiseptic oral rinse  15 mL Mouth Rinse BID  . darbepoetin (ARANESP) injection - DIALYSIS  100 mcg Intravenous Q Wed-HD  . lamiVUDine  50 mg Oral Daily  . lopinavir-ritonavir  5 mL Oral BID WC  . pantoprazole sodium  40 mg Oral Q1200  . paricalcitol  5 mcg Intravenous 3 times weekly  . piperacillin-tazobactam (ZOSYN)  IV  2.25 g Intravenous Q8H  . sodium chloride irrigation  1,000 mL Irrigation BID  . Zidovudine  300 mg Oral Daily  . DISCONTD: pantoprazole (PROTONIX) IV  40 mg Intravenous QHS  . DISCONTD: paricalcitol  1 mcg  Oral Daily   Continuous Infusions:   . sodium chloride 10 mL/hr (02/23/12 1356)   PRN Meds:.sodium chloride, sodium chloride, feeding supplement (NEPRO CARB STEADY), heparin, lidocaine, lidocaine-prilocaine, morphine injection, ondansetron, pentafluoroprop-tetrafluoroeth  ASSESSMENT & PLAN: Principal Problem:  *Sepsis secondary to suspected Fornier's gangrene Active Problems:  HIV INFECTION  ARF (acute renal failure) in the setting of stage III CKD  Metabolic acidosis  Hyperkalemia  Normocytic anemia  Hyponatremia  Acute respiratory failure   Fournier gangrene - Status post debridement by Dr. Sherron Monday on 02/17/2012. -Cultures are still negative to date including deep tissue cultures and blood cultures. -Patient is on Zosyn only now, appreciate Dr. Ilsa Iha help. Patient was on Zosyn, vancomycin and clindamycin.  Sepsis -This is resolved, patient is normotensive  now. -Secondary to Fournier gangrene, patient was on Zosyn, vancomycin and clindamycin. -Currently on Zosyn only. Infectious disease helping with antibiotics recommendation.  End-stage renal disease -Patient has chronic kidney disease which is progress during this hospitalization to ESRD. -Patient started on hemodialysis by nephrology. -Patient has a right IJ 23 cm Diatek catheter as access for dialysis  HIV/AIDS -Appreciate infectious disease help, patient is on Kaletra, lamivudine and zidovudine which is renally dosed. -CD4 count is 180.   Hepatitis C -Normal AST/ALT. And normal INR.  Metabolic acidosis -Patient presented with severe metabolic acidosis, his bicarbonate was 7, his ABGs showed pH of 7.12. -That was accompanied by respiratory distress to compensate for the metabolic acidosis. -Patient did not require mechanical ventilation.  Acute respiratory failure -This is accompanied the severe metabolic acidosis as ventilatory failure. -As mentioned above patient did not require intubation or mechanical ventilation.  Hemoptysis -Reported that he coughed up large blood clot after surgery. -It's unclear to me, if it is secondary to minor trauma from intubation during anesthesia. Or it is secondary to platelets dysfunction from uremia. It might be both. -No reported hemoptysis recently.  Altered mental status -Secondary to SIRS and uremia, this is improved with antibiotics, wound debridement and hemodialysis.   LOS: 7 days   Debbra Digiulio A 02/24/2012, 11:50 AM

## 2012-02-24 NOTE — Progress Notes (Signed)
Patient ID: Bryan Gilmore, male   DOB: 06-15-1951, 61 y.o.   MRN: [id number]    [facility] Progress Note    Subjective:   No emerging complaints overnight- Urology notes reviewed   Objective:   BP 137/79  Pulse 77  Temp 98.7 F (37.1 C) (Oral)  Resp 20  Ht 5\' 11"  (1.803 m)  Wt 101.4 kg (223 lb 8.7 oz)  BMI 31.18 kg/m2  SpO2 100%  Physical Exam: WUJ:[id number] resting in bed AOZ:HYQMV RRR, normal S1 and S2  Resp:CTA bilaterally, no rales/rhonchi HQI:ONGE, flat, mild LLQ tenderness, BS normal Ext:No LE edema  Labs: BMET  Lab 02/23/12 0802 02/22/12 0340 02/21/12 1458 02/20/12 1755 02/20/12 0500 02/19/12 1718 02/19/12 0500 02/18/12 1858  NA 136 136 134* 134* 136 136 136 --  K 3.7 3.6 5.0 4.3 4.2 4.0 4.1 --  CL 98 100 99 101 101 99 97 --  CO2 26 27 23 24 28 28 30  --  GLUCOSE 96 128* 105* 135* 114* 105* 98 --  BUN 27* 15 35* 26* 23 29* 44* --  CREATININE 5.13* 2.88* 4.55* 3.09* 2.16* 2.42* 3.02* --  ALB -- -- -- -- -- -- -- --  CALCIUM 8.3* 7.9* 7.8* 7.7* 7.9* 8.2* 8.4 --  PHOS 5.0* -- 5.6* 3.5 3.1 3.5 4.4 5.2*   CBC  Lab 02/23/12 0333 02/22/12 1929 02/22/12 0340 02/21/12 2038 02/17/12 1830  WBC 11.9* 13.5* 13.7* 15.0* --  NEUTROABS -- -- -- -- 25.8*  HGB 8.0* 8.2* 7.3* 8.3* --  HCT 22.3* 23.6* 20.6* 23.8* --  MCV 81.7 81.4 80.8 82.9 --  PLT 110* 115* 89* 91* --    @IMGRELPRIORS @ Medications:      . antiseptic oral rinse  15 mL Mouth Rinse BID  . darbepoetin (ARANESP) injection - DIALYSIS  100 mcg Intravenous Q Wed-HD  . lamiVUDine  50 mg Oral Daily  . lopinavir-ritonavir  5 mL Oral BID WC  . pantoprazole sodium  40 mg Oral Q1200  . paricalcitol  5 mcg Intravenous 3 times weekly  . piperacillin-tazobactam (ZOSYN)  IV  2.25 g Intravenous Q8H  . sodium chloride irrigation  1,000 mL Irrigation BID  . Zidovudine  300 mg Oral Daily  . DISCONTD: pantoprazole (PROTONIX) IV  40 mg Intravenous QHS  . DISCONTD: paricalcitol  1 mcg Oral Daily      Assessment/ Plan:   1. 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 Continue HD on MWF schedule (will likely continue HD at [facility] upon DC). From his vein mapping, it appears that AVG is his only option- discussed with Dr. Edilia Bo- this will be done after resolution of leukocytosis/improved wound healing- likely this coming week. 2. Anemia- stable s/p PRBC transfusion- monitor with perineal wound site losses. 3. Fournier's gangrene- s/p I&D and ongoing daily evaluations by urology, seen by ID and recommendations noted 4. Hep C/HIV- on ARVs per ID recommendations 5. AMS - Improved with HD/wound management   Zetta Bills, MD 02/24/2012, 10:05 AM

## 2012-02-24 NOTE — Progress Notes (Signed)
4 Days Post-Op  Subjective:  1 -Fournier's / Peri-Rectal Abscess - s/p operative debridement, now on IV ABX and wound packing.  No problems with dressing changes. Pt "feeling little better"  Objective: Vital signs in last 24 hours: Temp:  [97.8 F (36.6 C)-99.7 F (37.6 C)] 98.7 F (37.1 C) (06/15 0854) Pulse Rate:  [66-96] 77  (06/15 0854) Resp:  [13-20] 20  (06/15 0854) BP: (116-159)/(65-85) 137/79 mmHg (06/15 0854) SpO2:  [97 %-100 %] 100 % (06/15 0854) Weight:  [97.9 kg (215 lb 13.3 oz)-101.4 kg (223 lb 8.7 oz)] 101.4 kg (223 lb 8.7 oz) (06/14 1959) Last BM Date: 02/23/12  Intake/Output from previous day: 06/14 0701 - 06/15 0700 In: 550 [P.O.:360; I.V.:140] Out: 3000  Intake/Output this shift:    General appearance: alert and cooperative Nose: Nares normal. Septum midline. Mucosa normal. No drainage or sinus tenderness. Back: symmetric, no curvature. ROM normal. No CVA tenderness. Resp: clear to auscultation bilaterally Cardio: regular rate and rhythm, S1, S2 normal, no murmur, click, rub or gallop GI: large truncal obesity v. ascites Male genitalia: normal, see per wound Lymph nodes: Cervical, supraclavicular, and axillary nodes normal. Neurologic: Grossly normal Incision/Wound: Perineal wound with fibrinous material, no pus / non-viable tissue. Testes bilat in situ.  Lab Results:   [facility] 02/23/12 0333 02/22/12 1929  WBC 11.9* 13.5*  HGB 8.0* 8.2*  HCT 22.3* 23.6*  PLT 110* 115*   BMET  Basename 02/23/12 0802 02/22/12 0340  NA 136 136  K 3.7 3.6  CL 98 100  CO2 26 27  GLUCOSE 96 128*  BUN 27* 15  CREATININE 5.13* 2.88*  CALCIUM 8.3* 7.9*   PT/INR No results found for this basename: LABPROT:2,INR:2 in the last 72 hours ABG  Basename 02/22/12 1308  PHART 7.536*  HCO3 26.5*    Studies/Results: No results found.  Anti-infectives: Anti-infectives     Start     Dose/Rate Route Frequency Ordered Stop   02/23/12 1000   lamiVUDine (EPIVIR) 10  MG/ML solution 50 mg        50 mg Oral Daily 02/22/12 1211     02/23/12 1000   Zidovudine (RETROVIR) 50 MG/5ML syrup 300 mg        300 mg Oral Daily 02/22/12 1219     02/21/12 1800   vancomycin (VANCOCIN) IVPB 1000 mg/200 mL premix  Status:  Discontinued        1,000 mg 200 mL/hr over 60 Minutes Intravenous Every M-W-F (Hemodialysis) 02/21/12 1713 02/22/12 1255   02/20/12 2200   piperacillin-tazobactam (ZOSYN) IVPB 2.25 g        2.25 g 100 mL/hr over 30 Minutes Intravenous 3 times per day 02/20/12 1355 03/02/12 2159   02/19/12 1200   vancomycin (VANCOCIN) 1,500 mg in sodium chloride 0.9 % 500 mL IVPB  Status:  Discontinued        1,500 mg 250 mL/hr over 120 Minutes Intravenous Every 48 hours 02/17/12 1419 02/17/12 1617   02/19/12 1000   emtricitabine-tenofovir (TRUVADA) 200-300 MG per tablet 1 tablet  Status:  Discontinued        1 tablet Oral Every M-W-F 02/17/12 0820 02/17/12 1706   02/18/12 1700   lopinavir-ritonavir (KALETRA) 400-100 MG/5ML solution 400 mg        5 mL Oral 2 times daily with meals 02/18/12 1126     02/18/12 1400   clindamycin (CLEOCIN) IVPB 900 mg  Status:  Discontinued        900 mg 100 mL/hr over  30 Minutes Intravenous 3 times per day 02/18/12 1149 02/20/12 1200   02/18/12 1130   lamiVUDine (EPIVIR) 10 MG/ML solution 150 mg  Status:  Discontinued        150 mg Oral Daily 02/18/12 1126 02/22/12 1211   02/18/12 1130   Zidovudine (RETROVIR) 50 MG/5ML syrup 300 mg  Status:  Discontinued        300 mg Oral Every 12 hours 02/18/12 1126 02/22/12 1219   02/18/12 0800   vancomycin (VANCOCIN) IVPB 1000 mg/200 mL premix  Status:  Discontinued        1,000 mg 200 mL/hr over 60 Minutes Intravenous Every 24 hours 02/17/12 1619 02/20/12 1353   02/17/12 1800   piperacillin-tazobactam (ZOSYN) IVPB 3.375 g  Status:  Discontinued        3.375 g 100 mL/hr over 30 Minutes Intravenous 4 times per day 02/17/12 1619 02/20/12 1354   02/17/12 1300   piperacillin-tazobactam  (ZOSYN) IVPB 2.25 g  Status:  Discontinued        2.25 g 100 mL/hr over 30 Minutes Intravenous 4 times per day 02/17/12 1214 02/17/12 1617   02/17/12 1300   vancomycin (VANCOCIN) IVPB 1000 mg/200 mL premix        1,000 mg 200 mL/hr over 60 Minutes Intravenous  Once 02/17/12 1255 02/17/12 1448   02/17/12 1000   acyclovir (ZOVIRAX) 200 MG capsule 400 mg  Status:  Discontinued        400 mg Oral 3 times daily 02/17/12 0820 02/17/12 0833   02/17/12 1000   raltegravir (ISENTRESS) tablet 400 mg  Status:  Discontinued        400 mg Oral 2 times daily 02/17/12 0820 02/17/12 1701   02/17/12 1000   acyclovir (ZOVIRAX) tablet 400 mg  Status:  Discontinued        400 mg Oral 3 times daily 02/17/12 0832 02/17/12 1749   02/17/12 0730   piperacillin-tazobactam (ZOSYN) IVPB 2.25 g        2.25 g 100 mL/hr over 30 Minutes Intravenous  Once 02/17/12 0716 02/17/12 0951   02/17/12 0730   vancomycin (VANCOCIN) IVPB 1000 mg/200 mL premix        1,000 mg 200 mL/hr over 60 Minutes Intravenous  Once 02/17/12 0716 02/17/12 0854          Assessment/Plan: 1 -Fournier's / Peri-Rectal Abscess - Wound wtill in "debriding" stage with evidnece of loose fibrinous material at edges. This will come off with dakin's dressing changes. RX'd.  ABX per ID   LOS: 7 days    [facility], [place] 02/24/2012

## 2012-02-25 DIAGNOSIS — N186 End stage renal disease: Secondary | ICD-10-CM

## 2012-02-25 DIAGNOSIS — A413 Sepsis due to Hemophilus influenzae: Secondary | ICD-10-CM

## 2012-02-25 DIAGNOSIS — B20 Human immunodeficiency virus [HIV] disease: Secondary | ICD-10-CM

## 2012-02-25 DIAGNOSIS — L03818 Cellulitis of other sites: Secondary | ICD-10-CM

## 2012-02-25 DIAGNOSIS — L02818 Cutaneous abscess of other sites: Secondary | ICD-10-CM

## 2012-02-25 LAB — DIFFERENTIAL
Basophils Absolute: 0 10*3/uL (ref 0.0–0.1)
Eosinophils Absolute: 0.1 10*3/uL (ref 0.0–0.7)
Lymphocytes Relative: 11 % — ABNORMAL LOW (ref 12–46)
Monocytes Absolute: 0.9 10*3/uL (ref 0.1–1.0)
Neutrophils Relative %: 81 % — ABNORMAL HIGH (ref 43–77)

## 2012-02-25 LAB — CBC
MCH: 28.9 pg (ref 26.0–34.0)
MCHC: 33.9 g/dL (ref 30.0–36.0)
Platelets: 167 10*3/uL (ref 150–400)
RDW: 17 % — ABNORMAL HIGH (ref 11.5–15.5)

## 2012-02-25 LAB — GLUCOSE, CAPILLARY
Glucose-Capillary: 88 mg/dL (ref 70–99)
Glucose-Capillary: 105 mg/dL — ABNORMAL HIGH (ref 70–99)

## 2012-02-25 MED ORDER — HEPARIN SODIUM (PORCINE) 1000 UNIT/ML DIALYSIS
2000.0000 [IU] | INTRAMUSCULAR | Status: DC | PRN
  Administered 2012-02-26: 2000 [IU] via INTRAVENOUS_CENTRAL
  Filled 2012-02-25: qty 2

## 2012-02-25 NOTE — Progress Notes (Signed)
Subjective:  No complaints, good appetite, getting OOB with PT.   Objective:    Vital signs in last 24 hours: Filed Vitals:   02/24/12 2105 02/25/12 0418 02/25/12 0923 02/25/12 1305  BP: 137/74 140/77 146/80 153/77  Pulse: 78 76 71 75  Temp: 98.8 F (37.1 C) 98.1 F (36.7 C) 98 F (36.7 C) 97.9 F (36.6 C)  TempSrc: Oral Oral Oral Oral  Resp: 20 20 20 20   Height:      Weight: 105 kg (231 lb 7.7 oz)     SpO2: 100% 100% 100% 99%   Weight change: 4.4 kg (9 lb 11.2 oz)  Intake/Output Summary (Last 24 hours) at 02/25/12 1429 Last data filed at 02/25/12 1300  Gross per 24 hour  Intake   1158 ml  Output    100 ml  Net   1058 ml   Labs: Basic Metabolic Panel:  Lab 02/23/12 1610 02/22/12 0340 02/21/12 1458 02/20/12 1755 02/20/12 0500 02/19/12 1718 02/19/12 0500 02/18/12 1858  NA 136 136 134* 134* 136 136 136 --  K 3.7 3.6 5.0 4.3 4.2 4.0 4.1 --  CL 98 100 99 101 101 99 97 --  CO2 26 27 23 24 28 28 30  --  GLUCOSE 96 128* 105* 135* 114* 105* 98 --  BUN 27* 15 35* 26* 23 29* 44* --  CREATININE 5.13* 2.88* 4.55* 3.09* 2.16* 2.42* 3.02* --  ALB -- -- -- -- -- -- -- --  CALCIUM 8.3* 7.9* 7.8* 7.7* 7.9* 8.2* 8.4 --  PHOS 5.0* -- 5.6* 3.5 3.1 3.5 4.4 5.2*   Liver Function Tests:  Lab 02/23/12 0802 02/21/12 1458 02/20/12 1755  AST -- -- --  ALT -- -- --  ALKPHOS -- -- --  BILITOT -- -- --  PROT -- -- --  ALBUMIN 1.4* 1.3* 1.3*   No results found for this basename: LIPASE:3,AMYLASE:3 in the last 168 hours No results found for this basename: AMMONIA:3 in the last 168 hours CBC:  Lab 02/25/12 0537 02/23/12 0333 02/22/12 1929 02/22/12 0340  WBC 13.4* 11.9* 13.5* 13.7*  NEUTROABS 10.9* -- -- --  HGB 8.1* 8.0* 8.2* 7.3*  HCT 23.9* 22.3* 23.6* 20.6*  MCV 85.4 81.7 81.4 80.8  PLT 167 110* 115* 89*   Cardiac Enzymes: No results found for this basename: CKTOTAL:5,CKMB:5,CKMBINDEX:5,TROPONINI:5 in the last 168 hours CBG:  Lab 02/25/12 1215 02/25/12 0411 02/24/12 2006 02/24/12  1603 02/24/12 0750  GLUCAP 109* 88 101* 103* 94    Iron Studies: No results found for this basename: IRON:30,TIBC:30,SATURATION RATIOS:30,TRANSFERRIN:30,FERRITIN:30 in the last 168 hours  Physical Exam:  Blood pressure 153/77, pulse 75, temperature 97.9 F (36.6 C), temperature source Oral, resp. rate 20, height 5\' 11"  (1.803 m), weight 105 kg (231 lb 7.7 oz), SpO2 99.00%.  RUE:[id number] resting in bed  NWG:NFAOZ RRR, normal S1 and S2  Resp:CTA bilaterally, no rales/rhonchi  HYQ:MVHQ, flat, mild LLQ tenderness, BS normal  Ext:No LE edema  Impression/Plan 1. 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 Continue HD on MWF schedule (will likely continue HD at [facility] upon DC). From his vein mapping, it appears that AVG is his only option- discussed with Dr. Edilia Bo- this will be done after resolution of leukocytosis/improved wound healing, likely this coming week. HD tomorrow.  2. Anemia- stable s/p PRBC transfusion- monitor with perineal wound site losses. 3. Fournier's gangrene- s/p I&D and ongoing daily evaluations by urology, seen by ID and recommendations noted 4. 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 AMS - Improved with HD/wound management  Vinson Moselle  MD [facility] [phone] pgr    [phone] cell 02/25/2012, 2:29 PM

## 2012-02-25 NOTE — Progress Notes (Signed)
DAILY PROGRESS NOTE                              GENERAL [facility]NAL MEDICINE [facility]  SUBJECTIVE: Denies any pain, denies any fever or chills.  OBJECTIVE: BP 146/80  Pulse 71  Temp 98 F (36.7 C) (Oral)  Resp 20  Ht 5\' 11"  (1.803 m)  Wt 105 kg (231 lb 7.7 oz)  BMI 32.29 kg/m2  SpO2 100%  Intake/Output Summary (Last 24 hours) at 02/25/12 1128 Last data filed at 02/25/12 0900  Gross per 24 hour  Intake   1158 ml  Output    100 ml  Net   1058 ml                      Weight change: 4.4 kg (9 lb 11.2 oz) Physical Exam: General: Alert and awake oriented x3 not in any acute distress. HEENT: anicteric sclera, pupils equal reactive to light and accommodation CVS: S1-S2 heard, no murmur rubs or gallops Chest: clear to auscultation bilaterally, no wheezing rales or rhonchi Abdomen:  normal bowel sounds, soft, nontender, nondistended, no organomegaly Neuro: Cranial nerves II-XII intact, no focal neurological deficits Extremities: no cyanosis, no clubbing or edema noted bilaterally   Lab Results:  Basename 02/23/12 0802  NA 136  K 3.7  CL 98  CO2 26  GLUCOSE 96  BUN 27*  CREATININE 5.13*  CALCIUM 8.3*  MG --  PHOS 5.0*    Basename 02/23/12 0802  AST --  ALT --  ALKPHOS --  BILITOT --  PROT --  ALBUMIN 1.4*   No results found for this basename: LIPASE:2,AMYLASE:2 in the last 72 hours  Basename 02/25/12 0537 02/23/12 0333  WBC 13.4* 11.9*  NEUTROABS 10.9* --  HGB 8.1* 8.0*  HCT 23.9* 22.3*  MCV 85.4 81.7  PLT 167 110*   No results found for this basename: CKTOTAL:3,CKMB:3,CKMBINDEX:3,TROPONINI:3 in the last 72 hours No components found with this basename: POCBNP:3 No results found for this basename: DDIMER:2 in the last 72 hours No results found for this basename: HGBA1C:2 in the last 72 hours No results found for this basename: CHOL:2,HDL:2,LDLCALC:2,TRIG:2,CHOLHDL:2,LDLDIRECT:2 in the last 72 hours No results found for this basename:  TSH,T4TOTAL,FREET3,T3FREE,THYROIDAB in the last 72 hours No results found for this basename: VITAMINB12:2,FOLATE:2,FERRITIN:2,TIBC:2,IRON:2,RETICCTPCT:2 in the last 72 hours  Micro Results: Recent Results (from the past 240 hour(s))  CULTURE, BLOOD (ROUTINE X 2)     Status: Normal   Collection Time   02/17/12  8:50 AM      Component Value Range Status Comment   Specimen Description BLOOD RIGHT ARM   Final    Special Requests     Final    Value: BOTTLES DRAWN AEROBIC AND ANAEROBIC 2CC BOTH BOTTLES   Culture  Setup Time [id number]   Final    Culture NO GROWTH 5 DAYS   Final    Report Status 02/23/2012 FINAL   Final   WOUND CULTURE     Status: Normal   Collection Time   02/17/12 12:00 PM      Component Value Range Status Comment   Specimen Description ABSCESS PERIRECTAL   Final    Special Requests NONE   Final    Gram Stain     Final    Value: NO WBC SEEN     NO SQUAMOUS EPITHELIAL CELLS SEEN     NO ORGANISMS SEEN   Culture NO GROWTH  2 DAYS   Final    Report Status 02/20/2012 FINAL   Final   ANAEROBIC CULTURE     Status: Normal   Collection Time   02/17/12 12:00 PM      Component Value Range Status Comment   Specimen Description ABSCESS PERIRECTAL   Final    Special Requests NONE   Final    Gram Stain     Final    Value: FEW WBC PRESENT,BOTH PMN AND MONONUCLEAR     NO SQUAMOUS EPITHELIAL CELLS SEEN     NO ORGANISMS SEEN   Culture NO ANAEROBES ISOLATED   Final    Report Status 02/21/2012 FINAL   Final   MRSA PCR SCREENING     Status: Normal   Collection Time   02/17/12  1:57 PM      Component Value Range Status Comment   MRSA by PCR NEGATIVE  NEGATIVE Final   URINE CULTURE     Status: Normal   Collection Time   02/17/12  2:55 PM      Component Value Range Status Comment   Specimen Description URINE, CATHETERIZED   Final    Special Requests Immunocompromised   Final    Culture  Setup Time [id number]   Final    Colony Count NO GROWTH   Final    Culture NO GROWTH   Final     Report Status 02/18/2012 FINAL   Final   URINE CULTURE     Status: Normal   Collection Time   02/17/12  5:28 PM      Component Value Range Status Comment   Specimen Description URINE, CATHETERIZED   Final    Special Requests NONE   Final    Culture  Setup Time [id number]   Final    Colony Count NO GROWTH   Final    Culture NO GROWTH   Final    Report Status 02/18/2012 FINAL   Final   CULTURE, BLOOD (ROUTINE X 2)     Status: Normal   Collection Time   02/17/12  6:13 PM      Component Value Range Status Comment   Specimen Description BLOOD RIGHT ARM   Final    Special Requests BOTTLES DRAWN AEROBIC AND ANAEROBIC 10CC   Final    Culture  Setup Time [id number]   Final    Culture NO GROWTH 5 DAYS   Final    Report Status 02/24/2012 FINAL   Final   CULTURE, BLOOD (ROUTINE X 2)     Status: Normal   Collection Time   02/17/12  6:30 PM      Component Value Range Status Comment   Specimen Description BLOOD LEFT HEMODIALYSIS CATHETER   Final    Special Requests BOTTLES DRAWN AEROBIC AND ANAEROBIC 10CC   Final    Culture  Setup Time [id number]   Final    Culture NO GROWTH 5 DAYS   Final    Report Status 02/24/2012 FINAL   Final     Studies/Results: Ct Abdomen Wo Contrast  02/17/2012  *RADIOLOGY REPORT*  Clinical Data:  Testicular and flank pain, urinary retention, immunosuppressed.  CT ABDOMEN AND PELVIS WITHOUT CONTRAST  Technique:  Multidetector CT imaging of the abdomen and pelvis was performed following the standard protocol without intravenous contrast.  Comparison:  07/13/2010 ultrasound  Findings:  Cardiomegaly.  Coronary artery calcification.  Low attenuation of the blood pool suggests anemia.  Lung bases are clear.  Degraded by patient motion.  Organ abnormality/lesion  detection is limited in the absence of intravenous contrast. Within this limitation, unremarkable liver, spleen, pancreas, adrenal glands. Distended gallbladder.  No pericholecystic fluid or radiodense gallstones. No  biliary ductal dilatation.  Atrophic kidneys with perinephric fat stranding.  Focal lesion detection is significantly limited given the motion.  There is suggestion of a 1.9 cm interpolar left renal lesion with thin peripheral calcification (image 26 of series 2).  No hydronephrosis or hydroureter.  No bowel obstruction.  Normal appendix.  Circumferential bladder wall thickening, nonspecific given incomplete distension.  Vas deferens calcifications suggest diabetes.  There is scattered atherosclerotic calcification of the aorta and its branches. No aneurysmal dilatation.  There is peri-anal gas and a more focal pocket measuring 3.2 x 1.3 cm on series 2 image 80. Air tracks superiorly along the left iliac vessels and left para-aortic retroperitoneum.  Inferiorly, extends throughout the perineum, the medial right thigh, the right hemiscrotum and penis, partially imaged.  IMPRESSION: Findings are concerning for Fournier gangrene, with gas noted to track along soft tissues of the penis, right hemiscrotum, perineum, right thigh, and left retroperitoneum.  There is a perianal gas collection which may reflect a perianal abscess as the potential source of infection. Emergent urologic consultation recommended.  Critical Value/emergent results were called by telephone at the time of interpretation on February 17, 2012  at 06:15 a.m.  to  Dr. Read Drivers, who verbally acknowledged these results.  1.9 cm interpolar left renal lesion with thin peripheral calcification. Recommend ultrasound follow-up.  Original Report Authenticated By: Waneta Martins, M.D.   US Renal Port  02/18/2012  *RADIOLOGY REPORT*  Clinical Data: Acute renal failure.  History of hypertension and HIV infection.  Question hydronephrosis.  RENAL/URINARY TRACT ULTRASOUND COMPLETE - PORTABLE  Comparison:  Abdominal CT 02/17/2012.  Abdominal ultrasound 07/13/2010.  Findings:  Right Kidney:  There is cortical thinning with diffusely increased echogenicity.  No  hydronephrosis or focal renal abnormality is identified.  Renal length is 8.3 cm.  Left Kidney:  There is cortical thinning with diffusely increased echogenicity.  No hydronephrosis is present.  There is a small cyst in the interpolar region measuring 12 mm maximally.  Renal length is 9.7 cm.  Bladder:  Decompressed by Foley catheter.  IMPRESSION:  1.  Stable renal ultrasound with bilaterally increased echogenicity consistent with chronic medical renal disease. 2.  No hydronephrosis.  Original Report Authenticated By: Gerrianne Scale, M.D.   Dg Chest Port 1 View  02/22/2012  *RADIOLOGY REPORT*  Clinical Data: Pulmonary edema.  PORTABLE CHEST - 1 VIEW  Comparison: Chest x-ray 02/20/2012.  Findings: There is a right internal jugular perm catheter with tips terminating in the distal superior vena cava and superior aspect of the right atrium.  Previously noted left internal jugular central venous catheter has been removed.  No pneumothorax.  Lung volumes have decreased.  There are increasing bibasilar opacities favored to represent areas of atelectasis.  Minimal blunting of the left costophrenic sulcus could suggest a small left-sided pleural effusion.  Pulmonary venous congestion without frank pulmonary edema.  Mild cardiomegaly is unchanged.  Mediastinal contours are unremarkable.  Atherosclerotic calcifications within the arch of the aorta.  IMPRESSION: 1.  Support apparatus, as above. 2.  Low lung volumes with bibasilar subsegmental atelectasis and small left-sided pleural effusion. 3.  Mild cardiomegaly with mild pulmonary venous congestion.  No frank pulmonary edema at this time. 4.  Atherosclerosis.  Original Report Authenticated By: Florencia Reasons, M.D.   Dg Chest [facility]  02/20/2012  *RADIOLOGY REPORT*  Clinical Data: Status post dialysis catheter placement.  PORTABLE CHEST - 1 VIEW  Comparison: Chest radiograph 02/20/2012 at 5:14 hours.  Findings: Current radiograph at 10:26 hours.  A right IJ  dialysis catheter has been placed.  One of the catheter tips is in the mid to distal superior vena cava, and the other catheter tip is at the junction of the superior vena cava and right atrium.  Left IJ dialysis catheter is stable, terminating near the junction of the innominate vein and superior vena cava.  Stable cardiomegaly. Pulmonary vascularity appears normal.  Aeration of both lungs is improved compared to the chest radiograph performed earlier today. Decreasing left basilar atelectasis compared to earlier today. Negative for pulmonary edema.  Negative for pneumothorax.  IMPRESSION: Satisfactory position of right IJ dialysis catheter.  Negative for pneumothorax. Improved aeration of the lungs compared to most recent prior.  Original Report Authenticated By: Britta Mccreedy, M.D.   Dg Chest Port 1 View  02/20/2012  *RADIOLOGY REPORT*  Clinical Data: Hemoptysis and shortness of breath.  PORTABLE CHEST - 1 VIEW  Comparison: 02/19/2012  Findings: Left jugular dialysis catheter tip in the left innominate vein region.  Hazy densities at the left lung base.  Negative for a pneumothorax.  There are low lung volumes and the trachea is midline. Heart size is upper limits of normal.  IMPRESSION: Low lung volumes and probable left basilar atelectasis.  Dialysis catheter tip in the left innominate vein.  This catheter positioning is not optimal and may result in poor flows.  Original Report Authenticated By: Richarda Overlie, M.D.   Dg Chest Portable 1 View  02/19/2012  *RADIOLOGY REPORT*  Clinical Data: Hemoptysis.  PORTABLE CHEST - 1 VIEW  Comparison: Chest x-ray 02/18/2012.  Findings: There is a left-sided internal jugular central venous catheter with tip terminating in the near the confluence of the left innominate and superior vena cava. Lung volumes are low.  No consolidative airspace disease.  No definite pleural effusions. Pulmonary venous congestion without frank pulmonary edema.  Mild enlargement of the  cardiopericardial silhouette, increased compared to prior examination. The patient is rotated to the right on today's exam, resulting in distortion of the mediastinal contours and reduced diagnostic sensitivity and specificity for mediastinal pathology.  Atherosclerotic calcifications within the arch of the aorta.  IMPRESSION: 1.  Support apparatus, as above. 2.  Low lung volumes with increasing enlargement of the cardiopericardial silhouette (which may reflect increasing cardiac dilatation and/or the presence of pericardial effusion) and pulmonary venous congestion (without frank pulmonary edema). 3.  Atherosclerosis.  Original Report Authenticated By: Florencia Reasons, M.D.   Dg Chest Portable 1 View  02/18/2012  *RADIOLOGY REPORT*  Clinical Data: Follow up hemoptysis.  PORTABLE CHEST - 1 VIEW  Comparison: 02/18/2012  Findings: Left jugular central line tip is near the upper SVC. Heart size is upper limits of normal.  Few densities at the left lung base are suggestive for atelectasis.  Remainder of the lungs are clear.  No evidence for a pneumothorax.  Trachea is midline.  IMPRESSION:  Left basilar atelectasis.  Stable position of the central line.  Original Report Authenticated By: Richarda Overlie, M.D.   Dg Chest Port 1 View  02/18/2012  *RADIOLOGY REPORT*  Clinical Data: Respiratory failure  PORTABLE CHEST - 1 VIEW  Comparison: 02/17/2012  Findings: Left IJ catheter terminates at the junction of the left brachiocephalic vein and superior vena cava, directed laterally. Cardiomegaly is stable.  Lung volumes are  low.  There is mild peribronchial thickening at the lung bases.  Otherwise, the lungs appear clear.  No visible pleural effusion or pneumothorax.  IMPRESSION: Mild peribronchial thickening at the lung bases.  Otherwise, the lungs appear clear.  Original Report Authenticated By: Britta Mccreedy, M.D.   Dg Chest Port 1 View  02/17/2012  *RADIOLOGY REPORT*  Clinical Data: Line placement  PORTABLE CHEST - 1  VIEW  Comparison: 02/17/2012 at 1536 hours  Findings: Left IJ dual lumen catheter with its tip at the junction of the left brachiocephalic vein and SVC.  Lungs are essentially clear. No pleural effusion or pneumothorax.  Mild cardiomegaly.  IMPRESSION: Left IJ dual lumen catheter with its tip at the junction of the left brachiocephalic vein and SVC.  No pneumothorax.  Original Report Authenticated By: Charline Bills, M.D.   Dg Chest Port 1 View  02/17/2012  *RADIOLOGY REPORT*  Clinical Data: Pulmonary edema  PORTABLE CHEST - 1 VIEW  Comparison: None.  Findings: Cardiomediastinal silhouette is unremarkable.  No acute infiltrate or pleural effusion.  No pulmonary edema.  Bony thorax is unremarkable.  IMPRESSION: No active disease.  Original Report Authenticated By: Natasha Mead, M.D.   Dg Fluoro Guide Cv Line-no Report  02/20/2012  CLINICAL DATA: diatec insertion, right   FLOURO GUIDE CV LINE  Fluoroscopy was utilized by the requesting physician.  No radiographic  interpretation.     Dg C-arm 1-60 Min  02/17/2012  *RADIOLOGY REPORT*  Clinical Data:  Fournier's gangrene  RETROGRADE URETHROGRAM  Comparison:  None.  Fluoroscopy Time:  45 seconds  This exam was performed by Dr. Sherron Monday.  Findings:  Two spot fluoroscopic views of the lower pelvis were performed.  On image #1, there is a small-bore catheter projecting over the penis, with contrast in the urinary bladder.  Bladder is normal in shape. Contrast is seen with an irregular shape, projecting over the expected location of the penis.  It is uncertain if this is some contrast extravasation within the soft tissues of the penis, distal urethral injury, or contrast contamination outside of the patient.  The second image shows a larger bore Foley catheter satisfactorily positioned, with the balloon inflated in the lower urinary bladder and contrast within the urinary bladder.  The previously described contrast extravasation over the penile region is no longer  present. Question if it could be in a contamination adjacent to the patient. Numerous irregular locules of gas project over the perineum, consistent with the Fournier's gangrene seen by CT.  IMPRESSION: Two intraoperative images are submitted as described above.  Please also see Dr. Mina Marble report.  Original Report Authenticated By: Britta Mccreedy, M.D.   Medications: Scheduled Meds:    . antiseptic oral rinse  15 mL Mouth Rinse BID  . darbepoetin (ARANESP) injection - DIALYSIS  100 mcg Intravenous Q Wed-HD  . lamiVUDine  50 mg Oral Daily  . lopinavir-ritonavir  5 mL Oral BID WC  . pantoprazole sodium  40 mg Oral Q1200  . paricalcitol  5 mcg Intravenous 3 times weekly  . piperacillin-tazobactam (ZOSYN)  IV  2.25 g Intravenous Q8H  . sodium chloride irrigation  1,000 mL Irrigation BID  . Zidovudine  300 mg Oral Daily   Continuous Infusions:    . sodium chloride 10 mL/hr (02/23/12 1356)   PRN Meds:.sodium chloride, sodium chloride, feeding supplement (NEPRO CARB STEADY), heparin, lidocaine, lidocaine-prilocaine, morphine injection, ondansetron, pentafluoroprop-tetrafluoroeth  ASSESSMENT & PLAN: Principal Problem:  *Sepsis secondary to suspected Fornier's gangrene Active Problems:  HIV INFECTION  ARF (acute renal failure) in the setting of stage III CKD  Metabolic acidosis  Hyperkalemia  Normocytic anemia  Hyponatremia  Acute respiratory failure   Fournier gangrene/perirectal abscess - Status post debridement by Dr. Sherron Monday on 02/17/2012. Please advise if any plans for re-debridement -Cultures are still negative to date including deep tissue cultures and blood cultures. -Patient is on Zosyn only now, appreciate Dr. Ilsa Iha help. Patient was on Zosyn, vancomycin and clindamycin.  Sepsis -This is resolved, patient is normotensive now. -Secondary to Fournier gangrene, patient was on Zosyn, vancomycin and clindamycin. -Currently on Zosyn only. Infectious disease helping with  antibiotics recommendation. -ID recommended Zosyn for a total 14 days, then convert to her regimen,. -Please advise if Elita Quick is appropriate, because of the convenience of administration with hemodialysis.  End-stage renal disease -Patient has chronic kidney disease which is progress during this hospitalization to ESRD. -Patient started on hemodialysis by nephrology. -Patient has a right IJ 23 cm Diatek catheter as access for dialysis  HIV/AIDS -Appreciate infectious disease help, patient is on Kaletra, lamivudine and zidovudine which is renally dosed. -CD4 count is 180.   Hepatitis C -Normal AST/ALT. And normal INR.  Metabolic acidosis -Patient presented with severe metabolic acidosis, his bicarbonate was 7, his ABGs showed pH of 7.12. -That was accompanied by respiratory distress to compensate for the metabolic acidosis. -Patient did not require mechanical ventilation.  Acute respiratory failure -This is accompanied the severe metabolic acidosis as ventilatory failure. -As mentioned above patient did not require intubation or mechanical ventilation.  Hemoptysis -Reported that he coughed up large blood clot after surgery. -It's unclear to me, if it is secondary to minor trauma from intubation during anesthesia. Or it is secondary to platelets dysfunction from uremia. It might be both. -No reported hemoptysis recently.  Altered mental status -Secondary to SIRS and uremia, this is improved with antibiotics, wound debridement and hemodialysis.   LOS: 8 days   Suleyman Ehrman A 02/25/2012, 11:28 AM

## 2012-02-26 ENCOUNTER — Other Ambulatory Visit: Payer: Self-pay

## 2012-02-26 ENCOUNTER — Inpatient Hospital Stay (HOSPITAL_COMMUNITY): Payer: BC Managed Care – PPO

## 2012-02-26 DIAGNOSIS — N186 End stage renal disease: Secondary | ICD-10-CM | POA: Diagnosis not present

## 2012-02-26 DIAGNOSIS — L03818 Cellulitis of other sites: Secondary | ICD-10-CM

## 2012-02-26 DIAGNOSIS — A413 Sepsis due to Hemophilus influenzae: Secondary | ICD-10-CM | POA: Diagnosis not present

## 2012-02-26 DIAGNOSIS — L02818 Cutaneous abscess of other sites: Secondary | ICD-10-CM | POA: Diagnosis not present

## 2012-02-26 DIAGNOSIS — B20 Human immunodeficiency virus [HIV] disease: Secondary | ICD-10-CM

## 2012-02-26 LAB — RENAL FUNCTION PANEL
CO2: 23 mEq/L (ref 19–32)
Calcium: 8.6 mg/dL (ref 8.4–10.5)
GFR calc Af Amer: 8 mL/min — ABNORMAL LOW (ref >90)
GFR calc non Af Amer: 7 mL/min — ABNORMAL LOW (ref >90)
Phosphorus: 7.1 mg/dL — ABNORMAL HIGH (ref 2.3–4.6)
Potassium: 4.1 mEq/L (ref 3.5–5.1)
Sodium: 133 mEq/L — ABNORMAL LOW (ref 135–145)

## 2012-02-26 LAB — CBC
MCH: 29 pg (ref 26.0–34.0)
Platelets: 206 10*3/uL (ref 150–400)
RBC: 2.83 MIL/uL — ABNORMAL LOW (ref 4.22–5.81)
WBC: 12.6 10*3/uL — ABNORMAL HIGH (ref 4.0–10.5)

## 2012-02-26 LAB — GLUCOSE, CAPILLARY
Glucose-Capillary: 82 mg/dL (ref 70–99)
Glucose-Capillary: 63 mg/dL — ABNORMAL LOW (ref 70–99)
Glucose-Capillary: 143 mg/dL — ABNORMAL HIGH (ref 70–99)

## 2012-02-26 MED ORDER — CALCIUM ACETATE 667 MG PO CAPS
1334.0000 mg | ORAL_CAPSULE | Freq: Three times a day (TID) | ORAL | Status: DC
  Administered 2012-02-26 – 2012-03-01 (×12): 1334 mg via ORAL
  Filled 2012-02-26 (×16): qty 2

## 2012-02-26 MED ORDER — PARICALCITOL 5 MCG/ML IV SOLN
INTRAVENOUS | Status: AC
  Administered 2012-02-26: 5 ug via INTRAVENOUS
  Filled 2012-02-26: qty 1

## 2012-02-26 NOTE — Progress Notes (Signed)
VASCULAR PROGRESS NOTE  SUBJECTIVE: Alert. No complaints. On HD.  PHYSICAL EXAM: Filed Vitals:   02/26/12 0813 02/26/12 0830 02/26/12 0900 02/26/12 0930  BP: 134/80 143/88 127/91 132/93  Pulse: 67 71 93 81  Temp:      TempSrc:      Resp: 15 16 14 14   Height:      Weight:      SpO2:       Lungs clear. Palpable left radial pulse  LABS: Lab Results  Component Value Date   WBC 12.6* 02/26/2012   HGB 8.2* 02/26/2012   HCT 24.2* 02/26/2012   MCV 85.5 02/26/2012   PLT 206 02/26/2012   Lab Results  Component Value Date   CREATININE 7.62* 02/26/2012   Lab Results  Component Value Date   INR 1.37 02/21/2012   CBG (last 3)   Basename 02/26/12 0419 02/25/12 2017 02/25/12 1215  GLUCAP 82 105* 109*     ASSESSMENT/PLAN: 1. Hopefully can place a left AV graft later this week. Pt remains on IV antibiotics and getting dressing changes for Fournier's gangrene/perirectal abscess. Will tentatively schedule for surgery on Thursday, but may need to wait longer if WBC not down.   Waverly Ferrari, MD, FACS Beeper: [phone] 02/26/2012

## 2012-02-26 NOTE — Procedures (Signed)
Patient was seen on dialysis and the procedure was supervised.  BFR 300   Via PC BP is  141/79.   Patient appears to be tolerating treatment well  Vita Currin A 02/26/2012

## 2012-02-26 NOTE — Progress Notes (Signed)
On precautions and dialysis Wound granulating in Some necrotic tissue I think patient would benefit from irrigation/debridement under anesthesia and partial wound closure He consented I will arrange

## 2012-02-26 NOTE — Progress Notes (Signed)
02/26/2012 11:24 AM Hemodialysis Outpatient note; This patient has been accepted at the [facility] dialysis unit on a TTS 2nd shift schedule. The center can begin treatment on 06.18.13 at 1030 AM. Patient indicated to me that he would need transportation help. Hospital social worker will be notified. Thank you. Tilman Neat

## 2012-02-26 NOTE — Progress Notes (Signed)
PT Cancellation Note  Treatment cancelled today due to patient receiving procedure or test. Pt currently in HD.  Will re-attempt PT session later today as time allows, otherwise will follow up tomorrow; Thanks!  [name] [place], [place] [phone]  02/26/2012, 9:44 AM

## 2012-02-26 NOTE — Progress Notes (Signed)
DAILY PROGRESS NOTE                              GENERAL [facility]NAL MEDICINE [facility]  SUBJECTIVE: Seen while he was getting his dialysis, feels better.  OBJECTIVE: BP 153/91  Pulse 88  Temp 98.5 F (36.9 C) (Oral)  Resp 22  Ht 5\' 11"  (1.803 m)  Wt 103.5 kg (228 lb 2.8 oz)  BMI 31.82 kg/m2  SpO2 100%  Intake/Output Summary (Last 24 hours) at 02/26/12 1010 Last data filed at 02/26/12 0300  Gross per 24 hour  Intake    410 ml  Output    575 ml  Net   -165 ml                      Weight change: -3 kg (-6 lb 9.8 oz) Physical Exam: General: Alert and awake oriented x3 not in any acute distress. HEENT: anicteric sclera, pupils equal reactive to light and accommodation CVS: S1-S2 heard, no murmur rubs or gallops Chest: clear to auscultation bilaterally, no wheezing rales or rhonchi Abdomen:  normal bowel sounds, soft, nontender, nondistended, no organomegaly Neuro: Cranial nerves II-XII intact, no focal neurological deficits Extremities: no cyanosis, no clubbing or edema noted bilaterally   Lab Results:  Basename 02/26/12 0751  NA 133*  K 4.1  CL 92*  CO2 23  GLUCOSE 112*  BUN 39*  CREATININE 7.62*  CALCIUM 8.6  MG --  PHOS 7.1*    Basename 02/26/12 0751  AST --  ALT --  ALKPHOS --  BILITOT --  PROT --  ALBUMIN 1.5*   No results found for this basename: LIPASE:2,AMYLASE:2 in the last 72 hours  Basename 02/26/12 0750 02/25/12 0537  WBC 12.6* 13.4*  NEUTROABS -- 10.9*  HGB 8.2* 8.1*  HCT 24.2* 23.9*  MCV 85.5 85.4  PLT 206 167   No results found for this basename: CKTOTAL:3,CKMB:3,CKMBINDEX:3,TROPONINI:3 in the last 72 hours No components found with this basename: POCBNP:3 No results found for this basename: DDIMER:2 in the last 72 hours No results found for this basename: HGBA1C:2 in the last 72 hours No results found for this basename: CHOL:2,HDL:2,LDLCALC:2,TRIG:2,CHOLHDL:2,LDLDIRECT:2 in the last 72 hours No results found for this basename:  TSH,T4TOTAL,FREET3,T3FREE,THYROIDAB in the last 72 hours No results found for this basename: VITAMINB12:2,FOLATE:2,FERRITIN:2,TIBC:2,IRON:2,RETICCTPCT:2 in the last 72 hours  Micro Results: Recent Results (from the past 240 hour(s))  CULTURE, BLOOD (ROUTINE X 2)     Status: Normal   Collection Time   02/17/12  8:50 AM      Component Value Range Status Comment   Specimen Description BLOOD RIGHT ARM   Final    Special Requests     Final    Value: BOTTLES DRAWN AEROBIC AND ANAEROBIC 2CC BOTH BOTTLES   Culture  Setup Time [id number]   Final    Culture NO GROWTH 5 DAYS   Final    Report Status 02/23/2012 FINAL   Final   WOUND CULTURE     Status: Normal   Collection Time   02/17/12 12:00 PM      Component Value Range Status Comment   Specimen Description ABSCESS PERIRECTAL   Final    Special Requests NONE   Final    Gram Stain     Final    Value: NO WBC SEEN     NO SQUAMOUS EPITHELIAL CELLS SEEN     NO ORGANISMS SEEN   Culture  NO GROWTH 2 DAYS   Final    Report Status 02/20/2012 FINAL   Final   ANAEROBIC CULTURE     Status: Normal   Collection Time   02/17/12 12:00 PM      Component Value Range Status Comment   Specimen Description ABSCESS PERIRECTAL   Final    Special Requests NONE   Final    Gram Stain     Final    Value: FEW WBC PRESENT,BOTH PMN AND MONONUCLEAR     NO SQUAMOUS EPITHELIAL CELLS SEEN     NO ORGANISMS SEEN   Culture NO ANAEROBES ISOLATED   Final    Report Status 02/21/2012 FINAL   Final   MRSA PCR SCREENING     Status: Normal   Collection Time   02/17/12  1:57 PM      Component Value Range Status Comment   MRSA by PCR NEGATIVE  NEGATIVE Final   URINE CULTURE     Status: Normal   Collection Time   02/17/12  2:55 PM      Component Value Range Status Comment   Specimen Description URINE, CATHETERIZED   Final    Special Requests Immunocompromised   Final    Culture  Setup Time [id number]   Final    Colony Count NO GROWTH   Final    Culture NO GROWTH   Final     Report Status 02/18/2012 FINAL   Final   URINE CULTURE     Status: Normal   Collection Time   02/17/12  5:28 PM      Component Value Range Status Comment   Specimen Description URINE, CATHETERIZED   Final    Special Requests NONE   Final    Culture  Setup Time [id number]   Final    Colony Count NO GROWTH   Final    Culture NO GROWTH   Final    Report Status 02/18/2012 FINAL   Final   CULTURE, BLOOD (ROUTINE X 2)     Status: Normal   Collection Time   02/17/12  6:13 PM      Component Value Range Status Comment   Specimen Description BLOOD RIGHT ARM   Final    Special Requests BOTTLES DRAWN AEROBIC AND ANAEROBIC 10CC   Final    Culture  Setup Time [id number]   Final    Culture NO GROWTH 5 DAYS   Final    Report Status 02/24/2012 FINAL   Final   CULTURE, BLOOD (ROUTINE X 2)     Status: Normal   Collection Time   02/17/12  6:30 PM      Component Value Range Status Comment   Specimen Description BLOOD LEFT HEMODIALYSIS CATHETER   Final    Special Requests BOTTLES DRAWN AEROBIC AND ANAEROBIC 10CC   Final    Culture  Setup Time [id number]   Final    Culture NO GROWTH 5 DAYS   Final    Report Status 02/24/2012 FINAL   Final     Studies/Results: Ct Abdomen Wo Contrast  02/17/2012  *RADIOLOGY REPORT*  Clinical Data:  Testicular and flank pain, urinary retention, immunosuppressed.  CT ABDOMEN AND PELVIS WITHOUT CONTRAST  Technique:  Multidetector CT imaging of the abdomen and pelvis was performed following the standard protocol without intravenous contrast.  Comparison:  07/13/2010 ultrasound  Findings:  Cardiomegaly.  Coronary artery calcification.  Low attenuation of the blood pool suggests anemia.  Lung bases are clear.  Degraded by patient motion.  Organ abnormality/lesion detection is limited in the absence of intravenous contrast. Within this limitation, unremarkable liver, spleen, pancreas, adrenal glands. Distended gallbladder.  No pericholecystic fluid or radiodense gallstones. No  biliary ductal dilatation.  Atrophic kidneys with perinephric fat stranding.  Focal lesion detection is significantly limited given the motion.  There is suggestion of a 1.9 cm interpolar left renal lesion with thin peripheral calcification (image 26 of series 2).  No hydronephrosis or hydroureter.  No bowel obstruction.  Normal appendix.  Circumferential bladder wall thickening, nonspecific given incomplete distension.  Vas deferens calcifications suggest diabetes.  There is scattered atherosclerotic calcification of the aorta and its branches. No aneurysmal dilatation.  There is peri-anal gas and a more focal pocket measuring 3.2 x 1.3 cm on series 2 image 80. Air tracks superiorly along the left iliac vessels and left para-aortic retroperitoneum.  Inferiorly, extends throughout the perineum, the medial right thigh, the right hemiscrotum and penis, partially imaged.  IMPRESSION: Findings are concerning for Fournier gangrene, with gas noted to track along soft tissues of the penis, right hemiscrotum, perineum, right thigh, and left retroperitoneum.  There is a perianal gas collection which may reflect a perianal abscess as the potential source of infection. Emergent urologic consultation recommended.  Critical Value/emergent results were called by telephone at the time of interpretation on February 17, 2012  at 06:15 a.m.  to  Dr. Read Drivers, who verbally acknowledged these results.  1.9 cm interpolar left renal lesion with thin peripheral calcification. Recommend ultrasound follow-up.  Original Report Authenticated By: Waneta Martins, M.D.   US Renal Port  02/18/2012  *RADIOLOGY REPORT*  Clinical Data: Acute renal failure.  History of hypertension and HIV infection.  Question hydronephrosis.  RENAL/URINARY TRACT ULTRASOUND COMPLETE - PORTABLE  Comparison:  Abdominal CT 02/17/2012.  Abdominal ultrasound 07/13/2010.  Findings:  Right Kidney:  There is cortical thinning with diffusely increased echogenicity.  No  hydronephrosis or focal renal abnormality is identified.  Renal length is 8.3 cm.  Left Kidney:  There is cortical thinning with diffusely increased echogenicity.  No hydronephrosis is present.  There is a small cyst in the interpolar region measuring 12 mm maximally.  Renal length is 9.7 cm.  Bladder:  Decompressed by Foley catheter.  IMPRESSION:  1.  Stable renal ultrasound with bilaterally increased echogenicity consistent with chronic medical renal disease. 2.  No hydronephrosis.  Original Report Authenticated By: Gerrianne Scale, M.D.   Dg Chest Port 1 View  02/22/2012  *RADIOLOGY REPORT*  Clinical Data: Pulmonary edema.  PORTABLE CHEST - 1 VIEW  Comparison: Chest x-ray 02/20/2012.  Findings: There is a right internal jugular perm catheter with tips terminating in the distal superior vena cava and superior aspect of the right atrium.  Previously noted left internal jugular central venous catheter has been removed.  No pneumothorax.  Lung volumes have decreased.  There are increasing bibasilar opacities favored to represent areas of atelectasis.  Minimal blunting of the left costophrenic sulcus could suggest a small left-sided pleural effusion.  Pulmonary venous congestion without frank pulmonary edema.  Mild cardiomegaly is unchanged.  Mediastinal contours are unremarkable.  Atherosclerotic calcifications within the arch of the aorta.  IMPRESSION: 1.  Support apparatus, as above. 2.  Low lung volumes with bibasilar subsegmental atelectasis and small left-sided pleural effusion. 3.  Mild cardiomegaly with mild pulmonary venous congestion.  No frank pulmonary edema at this time. 4.  Atherosclerosis.  Original Report Authenticated By: Florencia Reasons, M.D.   Dg Chest Port 1  View  02/20/2012  *RADIOLOGY REPORT*  Clinical Data: Status post dialysis catheter placement.  PORTABLE CHEST - 1 VIEW  Comparison: Chest radiograph 02/20/2012 at 5:14 hours.  Findings: Current radiograph at 10:26 hours.  A right IJ  dialysis catheter has been placed.  One of the catheter tips is in the mid to distal superior vena cava, and the other catheter tip is at the junction of the superior vena cava and right atrium.  Left IJ dialysis catheter is stable, terminating near the junction of the innominate vein and superior vena cava.  Stable cardiomegaly. Pulmonary vascularity appears normal.  Aeration of both lungs is improved compared to the chest radiograph performed earlier today. Decreasing left basilar atelectasis compared to earlier today. Negative for pulmonary edema.  Negative for pneumothorax.  IMPRESSION: Satisfactory position of right IJ dialysis catheter.  Negative for pneumothorax. Improved aeration of the lungs compared to most recent prior.  Original Report Authenticated By: Britta Mccreedy, M.D.   Dg Chest Port 1 View  02/20/2012  *RADIOLOGY REPORT*  Clinical Data: Hemoptysis and shortness of breath.  PORTABLE CHEST - 1 VIEW  Comparison: 02/19/2012  Findings: Left jugular dialysis catheter tip in the left innominate vein region.  Hazy densities at the left lung base.  Negative for a pneumothorax.  There are low lung volumes and the trachea is midline. Heart size is upper limits of normal.  IMPRESSION: Low lung volumes and probable left basilar atelectasis.  Dialysis catheter tip in the left innominate vein.  This catheter positioning is not optimal and may result in poor flows.  Original Report Authenticated By: Richarda Overlie, M.D.   Dg Chest Portable 1 View  02/19/2012  *RADIOLOGY REPORT*  Clinical Data: Hemoptysis.  PORTABLE CHEST - 1 VIEW  Comparison: Chest x-ray 02/18/2012.  Findings: There is a left-sided internal jugular central venous catheter with tip terminating in the near the confluence of the left innominate and superior vena cava. Lung volumes are low.  No consolidative airspace disease.  No definite pleural effusions. Pulmonary venous congestion without frank pulmonary edema.  Mild enlargement of the  cardiopericardial silhouette, increased compared to prior examination. The patient is rotated to the right on today's exam, resulting in distortion of the mediastinal contours and reduced diagnostic sensitivity and specificity for mediastinal pathology.  Atherosclerotic calcifications within the arch of the aorta.  IMPRESSION: 1.  Support apparatus, as above. 2.  Low lung volumes with increasing enlargement of the cardiopericardial silhouette (which may reflect increasing cardiac dilatation and/or the presence of pericardial effusion) and pulmonary venous congestion (without frank pulmonary edema). 3.  Atherosclerosis.  Original Report Authenticated By: Florencia Reasons, M.D.   Dg Chest Portable 1 View  02/18/2012  *RADIOLOGY REPORT*  Clinical Data: Follow up hemoptysis.  PORTABLE CHEST - 1 VIEW  Comparison: 02/18/2012  Findings: Left jugular central line tip is near the upper SVC. Heart size is upper limits of normal.  Few densities at the left lung base are suggestive for atelectasis.  Remainder of the lungs are clear.  No evidence for a pneumothorax.  Trachea is midline.  IMPRESSION:  Left basilar atelectasis.  Stable position of the central line.  Original Report Authenticated By: Richarda Overlie, M.D.   Dg Chest Port 1 View  02/18/2012  *RADIOLOGY REPORT*  Clinical Data: Respiratory failure  PORTABLE CHEST - 1 VIEW  Comparison: 02/17/2012  Findings: Left IJ catheter terminates at the junction of the left brachiocephalic vein and superior vena cava, directed laterally. Cardiomegaly is stable.  Lung  volumes are low.  There is mild peribronchial thickening at the lung bases.  Otherwise, the lungs appear clear.  No visible pleural effusion or pneumothorax.  IMPRESSION: Mild peribronchial thickening at the lung bases.  Otherwise, the lungs appear clear.  Original Report Authenticated By: Britta Mccreedy, M.D.   Dg Chest Port 1 View  02/17/2012  *RADIOLOGY REPORT*  Clinical Data: Line placement  PORTABLE CHEST - 1  VIEW  Comparison: 02/17/2012 at 1536 hours  Findings: Left IJ dual lumen catheter with its tip at the junction of the left brachiocephalic vein and SVC.  Lungs are essentially clear. No pleural effusion or pneumothorax.  Mild cardiomegaly.  IMPRESSION: Left IJ dual lumen catheter with its tip at the junction of the left brachiocephalic vein and SVC.  No pneumothorax.  Original Report Authenticated By: Charline Bills, M.D.   Dg Chest Port 1 View  02/17/2012  *RADIOLOGY REPORT*  Clinical Data: Pulmonary edema  PORTABLE CHEST - 1 VIEW  Comparison: None.  Findings: Cardiomediastinal silhouette is unremarkable.  No acute infiltrate or pleural effusion.  No pulmonary edema.  Bony thorax is unremarkable.  IMPRESSION: No active disease.  Original Report Authenticated By: Natasha Mead, M.D.   Dg Fluoro Guide Cv Line-no Report  02/20/2012  CLINICAL DATA: diatec insertion, right   FLOURO GUIDE CV LINE  Fluoroscopy was utilized by the requesting physician.  No radiographic  interpretation.     Dg C-arm 1-60 Min  02/17/2012  *RADIOLOGY REPORT*  Clinical Data:  Fournier's gangrene  RETROGRADE URETHROGRAM  Comparison:  None.  Fluoroscopy Time:  45 seconds  This exam was performed by Dr. Sherron Monday.  Findings:  Two spot fluoroscopic views of the lower pelvis were performed.  On image #1, there is a small-bore catheter projecting over the penis, with contrast in the urinary bladder.  Bladder is normal in shape. Contrast is seen with an irregular shape, projecting over the expected location of the penis.  It is uncertain if this is some contrast extravasation within the soft tissues of the penis, distal urethral injury, or contrast contamination outside of the patient.  The second image shows a larger bore Foley catheter satisfactorily positioned, with the balloon inflated in the lower urinary bladder and contrast within the urinary bladder.  The previously described contrast extravasation over the penile region is no longer  present. Question if it could be in a contamination adjacent to the patient. Numerous irregular locules of gas project over the perineum, consistent with the Fournier's gangrene seen by CT.  IMPRESSION: Two intraoperative images are submitted as described above.  Please also see Dr. Mina Marble report.  Original Report Authenticated By: Britta Mccreedy, M.D.   Medications: Scheduled Meds:    . antiseptic oral rinse  15 mL Mouth Rinse BID  . darbepoetin (ARANESP) injection - DIALYSIS  100 mcg Intravenous Q Wed-HD  . lamiVUDine  50 mg Oral Daily  . lopinavir-ritonavir  5 mL Oral BID WC  . pantoprazole sodium  40 mg Oral Q1200  . paricalcitol  5 mcg Intravenous 3 times weekly  . piperacillin-tazobactam (ZOSYN)  IV  2.25 g Intravenous Q8H  . sodium chloride irrigation  1,000 mL Irrigation BID  . Zidovudine  300 mg Oral Daily   Continuous Infusions:    . sodium chloride 10 mL/hr (02/23/12 1356)   PRN Meds:.sodium chloride, sodium chloride, feeding supplement (NEPRO CARB STEADY), heparin, heparin, lidocaine, lidocaine-prilocaine, morphine injection, ondansetron, pentafluoroprop-tetrafluoroeth  ASSESSMENT & PLAN: Principal Problem:  *Sepsis secondary to suspected Fornier's gangrene Active Problems:  HIV INFECTION  ARF (acute renal failure) in the setting of stage III CKD  Metabolic acidosis  Hyperkalemia  Normocytic anemia  Hyponatremia  Acute respiratory failure   Fournier gangrene/perirectal abscess -Status post debridement by Dr. Sherron Monday on 02/17/2012. Please advise if any plans for re-debridement -Cultures are still negative to date including deep tissue cultures and blood cultures. -Patient is on Zosyn only now, appreciate Dr. Ilsa Iha help. Patient was on Zosyn, vancomycin and clindamycin.  Sepsis -This is resolved, patient is normotensive now. -Secondary to Fournier gangrene, patient was on Zosyn, vancomycin and clindamycin. -Currently on Zosyn only. Infectious disease  helping with antibiotics recommendation. -ID recommended Zosyn for a total 14 days, then convert to her regimen,. -Please advise if Elita Quick is appropriate, because of the convenience of administration with hemodialysis.  End-stage renal disease -Patient has chronic kidney disease which is progress during this hospitalization to ESRD. -Patient started on hemodialysis by nephrology. -Patient has a right IJ 23 cm Diatek catheter as access for dialysis  HIV/AIDS -Appreciate infectious disease help, patient is on Kaletra, lamivudine and zidovudine which is renally dosed. -CD4 count is 180.   Hepatitis C -Normal AST/ALT. And normal INR.  Metabolic acidosis -Patient presented with severe metabolic acidosis, his bicarbonate was 7, his ABGs showed pH of 7.12. -That was accompanied by respiratory distress to compensate for the metabolic acidosis. -Patient did not require mechanical ventilation.  Acute respiratory failure -This is accompanied the severe metabolic acidosis as ventilatory failure. -As mentioned above patient did not require intubation or mechanical ventilation.  Hemoptysis -Reported that he coughed up large blood clot after surgery. -It's unclear to me, if it is secondary to minor trauma from intubation during anesthesia. Or it is secondary to platelets dysfunction from uremia. It might be both. -No reported hemoptysis recently.  Altered mental status -Secondary to SIRS and uremia, this is improved with antibiotics, wound debridement and hemodialysis.  Disposition -Remains inpatient. -Vascular surgery plans for AV graft on Thursday if WBC is down. -Needs recommendations from urology, ID and vascular about when he'll be appropriate for discharge.   LOS: 9 days   Smt Lokey A 02/26/2012, 10:10 AM

## 2012-02-26 NOTE — Consult Note (Signed)
ANTIBIOTIC CONSULT NOTE - FOLLOW UP  Pharmacy Consult for Zosyn Indication: Fournier's gangrene  No Known Allergies  Patient Measurements: Height: 5\' 11"  (180.3 cm) Weight: 221 lb 5.5 oz (100.4 kg) IBW/kg (Calculated) : 75.3    Vital Signs: Temp Readings from Last 3 Encounters:  02/26/12 97.1 F (36.2 C) Oral  02/26/12 97.1 F (36.2 C) Oral  02/26/12 97.1 F (36.2 C) Oral    Intake/Output from previous day: 06/16 0701 - 06/17 0700 In: 650 [P.O.:600; IV Piggyback:50] Out: 575 [Urine:575]  Labs:  [facility] 02/26/12 0751 02/26/12 0750 02/25/12 0537  WBC -- 12.6* 13.4*  HGB -- 8.2* 8.1*  PLT -- 206 167  LABCREA -- -- --  CREATININE 7.62* -- --   @CRCL3 @   Microbiology: Recent Results (from the past 720 hour(s))  CULTURE, BLOOD (ROUTINE X 2)     Status: Normal   Collection Time   02/17/12  8:50 AM      Component Value Range Status Comment   Specimen Description BLOOD RIGHT ARM   Final    Special Requests     Final    Value: BOTTLES DRAWN AEROBIC AND ANAEROBIC 2CC BOTH BOTTLES   Culture  Setup Time [id number]   Final    Culture NO GROWTH 5 DAYS   Final    Report Status 02/23/2012 FINAL   Final   WOUND CULTURE     Status: Normal   Collection Time   02/17/12 12:00 PM      Component Value Range Status Comment   Specimen Description ABSCESS PERIRECTAL   Final    Special Requests NONE   Final    Gram Stain     Final    Value: NO WBC SEEN     NO SQUAMOUS EPITHELIAL CELLS SEEN     NO ORGANISMS SEEN   Culture NO GROWTH 2 DAYS   Final    Report Status 02/20/2012 FINAL   Final   ANAEROBIC CULTURE     Status: Normal   Collection Time   02/17/12 12:00 PM      Component Value Range Status Comment   Specimen Description ABSCESS PERIRECTAL   Final    Special Requests NONE   Final    Gram Stain     Final    Value: FEW WBC PRESENT,BOTH PMN AND MONONUCLEAR     NO SQUAMOUS EPITHELIAL CELLS SEEN     NO ORGANISMS SEEN   Culture NO ANAEROBES ISOLATED   Final    Report Status  02/21/2012 FINAL   Final   MRSA PCR SCREENING     Status: Normal   Collection Time   02/17/12  1:57 PM      Component Value Range Status Comment   MRSA by PCR NEGATIVE  NEGATIVE Final   URINE CULTURE     Status: Normal   Collection Time   02/17/12  2:55 PM      Component Value Range Status Comment   Specimen Description URINE, CATHETERIZED   Final    Special Requests Immunocompromised   Final    Culture  Setup Time [id number]   Final    Colony Count NO GROWTH   Final    Culture NO GROWTH   Final    Report Status 02/18/2012 FINAL   Final   URINE CULTURE     Status: Normal   Collection Time   02/17/12  5:28 PM      Component Value Range Status Comment   Specimen Description URINE, CATHETERIZED  Final    Special Requests NONE   Final    Culture  Setup Time [id number]   Final    Colony Count NO GROWTH   Final    Culture NO GROWTH   Final    Report Status 02/18/2012 FINAL   Final   CULTURE, BLOOD (ROUTINE X 2)     Status: Normal   Collection Time   02/17/12  6:13 PM      Component Value Range Status Comment   Specimen Description BLOOD RIGHT ARM   Final    Special Requests BOTTLES DRAWN AEROBIC AND ANAEROBIC 10CC   Final    Culture  Setup Time [id number]   Final    Culture NO GROWTH 5 DAYS   Final    Report Status 02/24/2012 FINAL   Final   CULTURE, BLOOD (ROUTINE X 2)     Status: Normal   Collection Time   02/17/12  6:30 PM      Component Value Range Status Comment   Specimen Description BLOOD LEFT HEMODIALYSIS CATHETER   Final    Special Requests BOTTLES DRAWN AEROBIC AND ANAEROBIC 10CC   Final    Culture  Setup Time [id number]   Final    Culture NO GROWTH 5 DAYS   Final    Report Status 02/24/2012 FINAL   Final     Anti-infectives Anti-infectives     Start     Dose/Rate Route Frequency Ordered Stop   02/23/12 1000   lamiVUDine (EPIVIR) 10 MG/ML solution 50 mg        50 mg Oral Daily 02/22/12 1211     02/23/12 1000   Zidovudine (RETROVIR) 50 MG/5ML syrup 300 mg          300 mg Oral Daily 02/22/12 1219     02/21/12 1800   vancomycin (VANCOCIN) IVPB 1000 mg/200 mL premix  Status:  Discontinued        1,000 mg 200 mL/hr over 60 Minutes Intravenous Every M-W-F (Hemodialysis) 02/21/12 1713 02/22/12 1255   02/20/12 2200  piperacillin-tazobactam (ZOSYN) IVPB 2.25 g       2.25 g 100 mL/hr over 30 Minutes Intravenous 3 times per day 02/20/12 1355 03/02/12 2159   02/19/12 1200   vancomycin (VANCOCIN) 1,500 mg in sodium chloride 0.9 % 500 mL IVPB  Status:  Discontinued        1,500 mg 250 mL/hr over 120 Minutes Intravenous Every 48 hours 02/17/12 1419 02/17/12 1617   02/19/12 1000   emtricitabine-tenofovir (TRUVADA) 200-300 MG per tablet 1 tablet  Status:  Discontinued        1 tablet Oral Every M-W-F 02/17/12 0820 02/17/12 1706   02/18/12 1700   lopinavir-ritonavir (KALETRA) 400-100 MG/5ML solution 400 mg        5 mL Oral 2 times daily with meals 02/18/12 1126     02/18/12 1400   clindamycin (CLEOCIN) IVPB 900 mg  Status:  Discontinued        900 mg 100 mL/hr over 30 Minutes Intravenous 3 times per day 02/18/12 1149 02/20/12 1200   02/18/12 1130   lamiVUDine (EPIVIR) 10 MG/ML solution 150 mg  Status:  Discontinued        150 mg Oral Daily 02/18/12 1126 02/22/12 1211   02/18/12 1130   Zidovudine (RETROVIR) 50 MG/5ML syrup 300 mg  Status:  Discontinued        300 mg Oral Every 12 hours 02/18/12 1126 02/22/12 1219   02/18/12 0800   vancomycin (VANCOCIN)  IVPB 1000 mg/200 mL premix  Status:  Discontinued        1,000 mg 200 mL/hr over 60 Minutes Intravenous Every 24 hours 02/17/12 1619 02/20/12 1353   02/17/12 1800   piperacillin-tazobactam (ZOSYN) IVPB 3.375 g  Status:  Discontinued        3.375 g 100 mL/hr over 30 Minutes Intravenous 4 times per day 02/17/12 1619 02/20/12 1354   02/17/12 1300   piperacillin-tazobactam (ZOSYN) IVPB 2.25 g  Status:  Discontinued        2.25 g 100 mL/hr over 30 Minutes Intravenous 4 times per day 02/17/12 1214 02/17/12  1617   02/17/12 1300   vancomycin (VANCOCIN) IVPB 1000 mg/200 mL premix        1,000 mg 200 mL/hr over 60 Minutes Intravenous  Once 02/17/12 1255 02/17/12 1448   02/17/12 1000   acyclovir (ZOVIRAX) 200 MG capsule 400 mg  Status:  Discontinued        400 mg Oral 3 times daily 02/17/12 0820 02/17/12 0833   02/17/12 1000   raltegravir (ISENTRESS) tablet 400 mg  Status:  Discontinued        400 mg Oral 2 times daily 02/17/12 0820 02/17/12 1701   02/17/12 1000   acyclovir (ZOVIRAX) tablet 400 mg  Status:  Discontinued        400 mg Oral 3 times daily 02/17/12 0832 02/17/12 1749   02/17/12 0730  piperacillin-tazobactam (ZOSYN) IVPB 2.25 g       2.25 g 100 mL/hr over 30 Minutes Intravenous  Once 02/17/12 0716 02/17/12 0951   02/17/12 0730   vancomycin (VANCOCIN) IVPB 1000 mg/200 mL premix        1,000 mg 200 mL/hr over 60 Minutes Intravenous  Once 02/17/12 0716 02/17/12 0854          Assessment:  61 y.o. male with HIV, CD 4 count of 440, VL 16,000 (in Aug 2012) off of ART in the last year, presents with fournier's gangrene and sepsis s/p emergent debridement.    Day # 10/14  Zosyn for gangrene.     Patient is also receiving ART which was restarted 02/18/12.  Afebrile, VSS.  Last WBC down to 12.6 from admission  WBC 28.  Planned AVG later this week.  Goal of Therapy:   14 days of Zosyn to complete antibiotic course.  Plan:   Continue Zosyn 2.25 gm IV q 8 hours.  Continue Virginia Curl, Elisha Headland, Pharm.D.  02/26/2012 2:16 PM

## 2012-02-26 NOTE — Progress Notes (Signed)
Subjective:  Seen on HD.  Alert, no complaints.  Objective Vital signs in last 24 hours: Filed Vitals:   02/26/12 0830 02/26/12 0900 02/26/12 0930 02/26/12 1000  BP: 143/88 127/91 132/93 153/91  Pulse: 71 93 81 88  Temp:      TempSrc:      Resp: 16 14 14 22   Height:      Weight:      SpO2:       Weight change: -3 kg (-6 lb 9.8 oz)  Intake/Output Summary (Last 24 hours) at 02/26/12 1006 Last data filed at 02/26/12 0300  Gross per 24 hour  Intake    410 ml  Output    575 ml  Net   -165 ml   Labs: Basic Metabolic Panel:  Lab 02/26/12 1610 02/23/12 0802 02/22/12 0340 02/21/12 1458  NA 133* 136 136 --  K 4.1 3.7 3.6 --  CL 92* 98 100 --  CO2 23 26 27  --  GLUCOSE 112* 96 128* --  BUN 39* 27* 15 --  CREATININE 7.62* 5.13* 2.88* --  CALCIUM 8.6 8.3* 7.9* --  ALB -- -- -- --  PHOS 7.1* 5.0* -- 5.6*   Liver Function Tests:  Lab 02/26/12 0751 02/23/12 0802 02/21/12 1458  AST -- -- --  ALT -- -- --  ALKPHOS -- -- --  BILITOT -- -- --  PROT -- -- --  ALBUMIN 1.5* 1.4* 1.3*   No results found for this basename: LIPASE:3,AMYLASE:3 in the last 168 hours No results found for this basename: AMMONIA:3 in the last 168 hours CBC:  Lab 02/26/12 0750 02/25/12 0537 02/23/12 0333 02/22/12 1929 02/22/12 0340  WBC 12.6* 13.4* 11.9* -- --  NEUTROABS -- 10.9* -- -- --  HGB 8.2* 8.1* 8.0* -- --  HCT 24.2* 23.9* 22.3* -- --  MCV 85.5 85.4 81.7 81.4 80.8  PLT 206 167 110* -- --   Cardiac Enzymes: No results found for this basename: CKTOTAL:5,CKMB:5,CKMBINDEX:5,TROPONINI:5 in the last 168 hours CBG:  Lab 02/26/12 0419 02/25/12 2017 02/25/12 1215 02/25/12 0411 02/24/12 2006  GLUCAP 82 105* 109* 88 101*    Iron Studies: No results found for this basename: IRON,TIBC,TRANSFERRIN,FERRITIN in the last 72 hours Studies/Results: No results found. Medications: Infusions:    . sodium chloride 10 mL/hr (02/23/12 1356)    Scheduled Medications:    . antiseptic oral rinse  15 mL  Mouth Rinse BID  . darbepoetin (ARANESP) injection - DIALYSIS  100 mcg Intravenous Q Wed-HD  . lamiVUDine  50 mg Oral Daily  . lopinavir-ritonavir  5 mL Oral BID WC  . pantoprazole sodium  40 mg Oral Q1200  . paricalcitol  5 mcg Intravenous 3 times weekly  . piperacillin-tazobactam (ZOSYN)  IV  2.25 g Intravenous Q8H  . sodium chloride irrigation  1,000 mL Irrigation BID  . Zidovudine  300 mg Oral Daily    have reviewed scheduled and prn medications.  Physical Exam: General: alert, no new complaints Heart: RRR Lungs: mostly clear Abdomen: soft, nontender Extremities: no edema .  Groin not examined Dialysis Access: PC   I Assessment/ Plan: Pt is a 61 y.o. yo male who was admitted on 02/17/2012 with  Fournier's gangrene and new start to HD Assessment/Plan: 1. 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 Continue HD , now on MWF schedule but will need to be transitioned to TTS (schedule at [facility])  From his vein mapping, it appears that AVG is his only option- possibly planned for  Thursday per VVS. HD via PC for now.  Will arrange HD around  AVG placement.  2. Anemia- stable s/p PRBC transfusion- monitor with perineal wound site losses. On aranesp as well 3. Fournier's gangrene- s/p I&D and ongoing daily evaluations by urology, seen by ID and recommendations noted.  On zosyn 4. Hep C/HIV- on ARVs per ID recommendations 5. AMS - Improved with HD/wound management 6. Bones- PTH 938, phos 7.1, calc 8.6- zemplar, need to add binder (phoslo) 7. Dispo-  Not sure what date of discharge might be.  Is set up at [facility] for HD, needs AVG, would like to do prior to discharge    Bryan Gilmore A   02/26/2012,10:06 AM  LOS: 9 days

## 2012-02-27 DIAGNOSIS — A413 Sepsis due to Hemophilus influenzae: Secondary | ICD-10-CM

## 2012-02-27 DIAGNOSIS — B20 Human immunodeficiency virus [HIV] disease: Secondary | ICD-10-CM

## 2012-02-27 DIAGNOSIS — L02818 Cutaneous abscess of other sites: Secondary | ICD-10-CM

## 2012-02-27 DIAGNOSIS — L03818 Cellulitis of other sites: Secondary | ICD-10-CM

## 2012-02-27 DIAGNOSIS — N186 End stage renal disease: Secondary | ICD-10-CM

## 2012-02-27 LAB — GLUCOSE, CAPILLARY: Glucose-Capillary: 107 mg/dL — ABNORMAL HIGH (ref 70–99)

## 2012-02-27 MED ORDER — DAKINS (1/2 STRENGTH) 0.25 % EX SOLN
Freq: Every day | CUTANEOUS | Status: DC | PRN
  Filled 2012-02-27 (×2): qty 473

## 2012-02-27 MED ORDER — HEPARIN SODIUM (PORCINE) 1000 UNIT/ML DIALYSIS
20.0000 [IU]/kg | INTRAMUSCULAR | Status: DC | PRN
  Filled 2012-02-27: qty 2

## 2012-02-27 MED ORDER — DAKINS (1/2 STRENGTH) 0.25 % EX SOLN
CUTANEOUS | Status: DC | PRN
  Filled 2012-02-27: qty 473

## 2012-02-27 MED ORDER — HYDRALAZINE HCL 20 MG/ML IJ SOLN
5.0000 mg | Freq: Four times a day (QID) | INTRAMUSCULAR | Status: DC | PRN
  Administered 2012-02-28: 5 mg via INTRAVENOUS
  Filled 2012-02-27 (×2): qty 0.25

## 2012-02-27 NOTE — Progress Notes (Signed)
   CARE MANAGEMENT NOTE 02/27/2012  Patient:  Bryan Gilmore, Bryan Gilmore   Account Number:  [id number]  Date Initiated:  02/19/2012  Documentation initiated by:  [facility]  Subjective/Objective Assessment:   Fourniers gangrene and perirectal abscess. Renal failure - requiring dialysis.     Action/Plan:   Anticipated DC Date:  02/26/2012   Anticipated DC Plan:  HOME W HOME HEALTH SERVICES  In-house referral  Clinical Social Worker      DC Planning Services  CM consult      Choice offered to / List presented to:             Status of service:  In process, will continue to follow Medicare Important Message given?   (If response is "NO", the following Medicare IM given date fields will be blank) Date Medicare IM given:   Date Additional Medicare IM given:    Discharge Disposition:    Per UR Regulation:  Reviewed for med. necessity/level of care/duration of stay  If discussed at Long Length of Stay Meetings, dates discussed:    Comments:  02/27/2012 1500 Darlyne Russian RN, [place]  [phone] CM and SW spoke with patient regarding discharge planning for continued medical care. He lives alone and his nephew [name] can help as needed. Discussed the option of LTAC or SNF and his insurance copay. He will review the listing for the facilities and his nephew might be able to visit them. CM to continue to follow for discharge planning needs.  PCP - does not have one currently - in past has seen [facility].  [address] [name]  home [phone] or cell [phone]  02-22-12 1:30pm [name], RNBSN - [phone] Talked more with Bryan Gilmore about discharge plan.  Does feel he will need assistance at home - new dialysis and probably wound care on discharge.  Discussed options - would be interested in [facility] - insurance only in network at [facility]. Discussed with physician - order recieved.  Referred  to [facility].

## 2012-02-27 NOTE — Clinical Social Work Placement (Signed)
Clinical Social Work Department CLINICAL SOCIAL WORK PLACEMENT NOTE 02/27/2012  Patient:  LARK, RUNK  Account Number:  [id number] Admit date:  02/17/2012  Clinical Social Worker:  Genelle Bal, LCSW  Date/time:  02/27/2012 06:21 AM  Clinical Social Work is seeking post-discharge placement for this patient at the following level of care:   SKILLED NURSING   (*CSW will update this form in Epic as items are completed)   02/27/2012  Patient/family provided with [name] Department of Clinical Social Work's list of facilities offering this level of care within the geographic area requested by the patient (or if unable, by the patient's family).  02/27/2012  Patient/family informed of their freedom to choose among providers that offer the needed level of care, that participate in Medicare, Medicaid or managed care program needed by the patient, have an available bed and are willing to accept the patient.    Patient/family informed of [facility]' ownership interest in [facility], as well as of the fact that they are under no obligation to receive care at this facility.  PASARR submitted to EDS on 02/27/2012 PASARR number received from [employer] on   FL2 transmitted to all facilities in geographic area requested by pt/family on   FL2 transmitted to all facilities within larger geographic area on   Patient informed that his/her managed care company has contracts with or will negotiate with  certain facilities, including the following:     Patient/family informed of bed offers received:   Patient chooses bed at  Physician recommends and patient chooses bed at    Patient to be transferred to  on   Patient to be transferred to facility by   The following physician request were entered in Epic:   Additional Comments:

## 2012-02-27 NOTE — Progress Notes (Signed)
Spoke with Dr.Elmahi about pt having 4 beats of PVC's back to back. Pt asymptomatic and still running A-Fib. Will continue to monitor.

## 2012-02-27 NOTE — Progress Notes (Signed)
Subjective:    Alert, no complaints. I see plans for possible operative debridement but dont know when.  I agree that we may need to hold off on AVG placement for safety reasons Objective Vital signs in last 24 hours: Filed Vitals:   02/26/12 1729 02/26/12 2100 02/27/12 0418 02/27/12 1000  BP: 153/83 110/78 149/75 182/89  Pulse: 88 89 83 91  Temp: 98.3 F (36.8 C) 99.3 F (37.4 C) 98.9 F (37.2 C) 98.6 F (37 C)  TempSrc: Oral Oral Oral Oral  Resp: 19 19 20 20   Height:      Weight:  101.2 kg (223 lb 1.7 oz)    SpO2: 100% 100% 96% 98%   Weight change: 1.5 kg (3 lb 4.9 oz)  Intake/Output Summary (Last 24 hours) at 02/27/12 1049 Last data filed at 02/27/12 0900  Gross per 24 hour  Intake    890 ml  Output   3408 ml  Net  -2518 ml   Labs: Basic Metabolic Panel:  Lab 02/26/12 2956 02/23/12 0802 02/22/12 0340 02/21/12 1458  NA 133* 136 136 --  K 4.1 3.7 3.6 --  CL 92* 98 100 --  CO2 23 26 27  --  GLUCOSE 112* 96 128* --  BUN 39* 27* 15 --  CREATININE 7.62* 5.13* 2.88* --  CALCIUM 8.6 8.3* 7.9* --  ALB -- -- -- --  PHOS 7.1* 5.0* -- 5.6*   Liver Function Tests:  Lab 02/26/12 0751 02/23/12 0802 02/21/12 1458  AST -- -- --  ALT -- -- --  ALKPHOS -- -- --  BILITOT -- -- --  PROT -- -- --  ALBUMIN 1.5* 1.4* 1.3*   No results found for this basename: LIPASE:3,AMYLASE:3 in the last 168 hours No results found for this basename: AMMONIA:3 in the last 168 hours CBC:  Lab 02/26/12 0750 02/25/12 0537 02/23/12 0333 02/22/12 1929 02/22/12 0340  WBC 12.6* 13.4* 11.9* -- --  NEUTROABS -- 10.9* -- -- --  HGB 8.2* 8.1* 8.0* -- --  HCT 24.2* 23.9* 22.3* -- --  MCV 85.5 85.4 81.7 81.4 80.8  PLT 206 167 110* -- --   Cardiac Enzymes: No results found for this basename: CKTOTAL:5,CKMB:5,CKMBINDEX:5,TROPONINI:5 in the last 168 hours CBG:  Lab 02/27/12 0747 02/27/12 0413 02/26/12 2124 02/26/12 1247 02/26/12 1214  GLUCAP 93 107* 143* 94 63*    Iron Studies: No results found for  this basename: IRON,TIBC,TRANSFERRIN,FERRITIN in the last 72 hours Studies/Results: No results found. Medications: Infusions:    . sodium chloride 20 mL/hr (02/27/12 0021)    Scheduled Medications:    . antiseptic oral rinse  15 mL Mouth Rinse BID  . calcium acetate  1,334 mg Oral TID WC  . darbepoetin (ARANESP) injection - DIALYSIS  100 mcg Intravenous Q Wed-HD  . lamiVUDine  50 mg Oral Daily  . lopinavir-ritonavir  5 mL Oral BID WC  . pantoprazole sodium  40 mg Oral Q1200  . paricalcitol  5 mcg Intravenous 3 times weekly  . piperacillin-tazobactam (ZOSYN)  IV  2.25 g Intravenous Q8H  . sodium chloride irrigation  1,000 mL Irrigation BID  . Zidovudine  300 mg Oral Daily    have reviewed scheduled and prn medications.  Physical Exam: General: alert, no new complaints Heart: RRR Lungs: mostly clear Abdomen: soft, nontender Extremities: no edema .  Groin not examined Dialysis Access: PC   I Assessment/ Plan: Pt is a 61 y.o. yo male who was admitted on 02/17/2012 with  Fournier's gangrene and  new start to HD Assessment/Plan: 1. 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 Continue HD , now on MWF schedule but will need to be transitioned to TTS (schedule at [facility])  From his vein mapping, it appears that AVG is his only option- possibly planned for Thursday per VVS. HD via PC for now.  Will need to arrange HD around  AVG placement and possible groin debridement. Does not look like anything scheduled for tomorrow so will plan HD, then could maybe wait until Saturday for next treatment 2. Anemia- stable s/p PRBC transfusion- monitor with perineal wound site losses. On aranesp as well 3. Fournier's gangrene- s/p I&D and ongoing daily evaluations by urology, seen by ID and recommendations noted.  On zosyn 4. Hep C/HIV- on ARVs per ID recommendations 5. AMS - Improved with HD/wound management 6. Bones- PTH 938, phos 7.1, calc 8.6- zemplar, added binder (phoslo) 7. Dispo-   Not sure what date of discharge might be.  Is set up at [facility] for HD, needs AVG, would like to do prior to discharge if possible   Chenee Munns A   02/27/2012,10:49 AM  LOS: 10 days

## 2012-02-27 NOTE — Progress Notes (Signed)
VASCULAR SURGERY  Urology plans irrigation/debridement under anesthesia and partial wound closure. I definitely would not want to proceed with AVG at the same time and it would be best to place AVG 24-48 hours after this is done because of the bacteremia that will result from I&D of wound. I could always arrange this as an out-pt. Would like ID's input on this if possible.   Di Kindle. Edilia Bo, MD, FACS Beeper [phone] 02/27/2012

## 2012-02-27 NOTE — Progress Notes (Signed)
DAILY PROGRESS NOTE                              GENERAL INTERNAL MEDICINE [facility]  SUBJECTIVE: Seen while he was getting his dialysis, feels better.  OBJECTIVE: BP 149/75  Pulse 83  Temp 98.9 F (37.2 C) (Oral)  Resp 20  Ht 5\' 11"  (1.803 m)  Wt 101.2 kg (223 lb 1.7 oz)  BMI 31.12 kg/m2  SpO2 96%  Intake/Output Summary (Last 24 hours) at 02/27/12 1000 Last data filed at 02/27/12 0900  Gross per 24 hour  Intake    890 ml  Output   3408 ml  Net  -2518 ml                      Weight change: 1.5 kg (3 lb 4.9 oz) Physical Exam: General: Alert and awake oriented x3 not in any acute distress. HEENT: anicteric sclera, pupils equal reactive to light and accommodation CVS: S1-S2 heard, no murmur rubs or gallops Chest: clear to auscultation bilaterally, no wheezing rales or rhonchi Abdomen:  normal bowel sounds, soft, nontender, nondistended, no organomegaly Neuro: Cranial nerves II-XII intact, no focal neurological deficits Extremities: no cyanosis, no clubbing or edema noted bilaterally   Lab Results:  Basename 02/26/12 0751  NA 133*  K 4.1  CL 92*  CO2 23  GLUCOSE 112*  BUN 39*  CREATININE 7.62*  CALCIUM 8.6  MG --  PHOS 7.1*    Basename 02/26/12 0751  AST --  ALT --  ALKPHOS --  BILITOT --  PROT --  ALBUMIN 1.5*   No results found for this basename: LIPASE:2,AMYLASE:2 in the last 72 hours  Basename 02/26/12 0750 02/25/12 0537  WBC 12.6* 13.4*  NEUTROABS -- 10.9*  HGB 8.2* 8.1*  HCT 24.2* 23.9*  MCV 85.5 85.4  PLT 206 167   No results found for this basename: CKTOTAL:3,CKMB:3,CKMBINDEX:3,TROPONINI:3 in the last 72 hours No components found with this basename: POCBNP:3 No results found for this basename: DDIMER:2 in the last 72 hours No results found for this basename: HGBA1C:2 in the last 72 hours No results found for this basename: CHOL:2,HDL:2,LDLCALC:2,TRIG:2,CHOLHDL:2,LDLDIRECT:2 in the last 72 hours No results found for this basename:  TSH,T4TOTAL,FREET3,T3FREE,THYROIDAB in the last 72 hours No results found for this basename: VITAMINB12:2,FOLATE:2,FERRITIN:2,TIBC:2,IRON:2,RETICCTPCT:2 in the last 72 hours  Micro Results: Recent Results (from the past 240 hour(s))  WOUND CULTURE     Status: Normal   Collection Time   02/17/12 12:00 PM      Component Value Range Status Comment   Specimen Description ABSCESS PERIRECTAL   Final    Special Requests NONE   Final    Gram Stain     Final    Value: NO WBC SEEN     NO SQUAMOUS EPITHELIAL CELLS SEEN     NO ORGANISMS SEEN   Culture NO GROWTH 2 DAYS   Final    Report Status 02/20/2012 FINAL   Final   ANAEROBIC CULTURE     Status: Normal   Collection Time   02/17/12 12:00 PM      Component Value Range Status Comment   Specimen Description ABSCESS PERIRECTAL   Final    Special Requests NONE   Final    Gram Stain     Final    Value: FEW WBC PRESENT,BOTH PMN AND MONONUCLEAR     NO SQUAMOUS EPITHELIAL CELLS SEEN     NO  ORGANISMS SEEN   Culture NO ANAEROBES ISOLATED   Final    Report Status 02/21/2012 FINAL   Final   MRSA PCR SCREENING     Status: Normal   Collection Time   02/17/12  1:57 PM      Component Value Range Status Comment   MRSA by PCR NEGATIVE  NEGATIVE Final   URINE CULTURE     Status: Normal   Collection Time   02/17/12  2:55 PM      Component Value Range Status Comment   Specimen Description URINE, CATHETERIZED   Final    Special Requests Immunocompromised   Final    Culture  Setup Time [id number]   Final    Colony Count NO GROWTH   Final    Culture NO GROWTH   Final    Report Status 02/18/2012 FINAL   Final   URINE CULTURE     Status: Normal   Collection Time   02/17/12  5:28 PM      Component Value Range Status Comment   Specimen Description URINE, CATHETERIZED   Final    Special Requests NONE   Final    Culture  Setup Time [id number]   Final    Colony Count NO GROWTH   Final    Culture NO GROWTH   Final    Report Status 02/18/2012 FINAL   Final     CULTURE, BLOOD (ROUTINE X 2)     Status: Normal   Collection Time   02/17/12  6:13 PM      Component Value Range Status Comment   Specimen Description BLOOD RIGHT ARM   Final    Special Requests BOTTLES DRAWN AEROBIC AND ANAEROBIC 10CC   Final    Culture  Setup Time [id number]   Final    Culture NO GROWTH 5 DAYS   Final    Report Status 02/24/2012 FINAL   Final   CULTURE, BLOOD (ROUTINE X 2)     Status: Normal   Collection Time   02/17/12  6:30 PM      Component Value Range Status Comment   Specimen Description BLOOD LEFT HEMODIALYSIS CATHETER   Final    Special Requests BOTTLES DRAWN AEROBIC AND ANAEROBIC 10CC   Final    Culture  Setup Time [id number]   Final    Culture NO GROWTH 5 DAYS   Final    Report Status 02/24/2012 FINAL   Final     Studies/Results: Ct Abdomen Wo Contrast  02/17/2012  *RADIOLOGY REPORT*  Clinical Data:  Testicular and flank pain, urinary retention, immunosuppressed.  CT ABDOMEN AND PELVIS WITHOUT CONTRAST  Technique:  Multidetector CT imaging of the abdomen and pelvis was performed following the standard protocol without intravenous contrast.  Comparison:  07/13/2010 ultrasound  Findings:  Cardiomegaly.  Coronary artery calcification.  Low attenuation of the blood pool suggests anemia.  Lung bases are clear.  Degraded by patient motion.  Organ abnormality/lesion detection is limited in the absence of intravenous contrast. Within this limitation, unremarkable liver, spleen, pancreas, adrenal glands. Distended gallbladder.  No pericholecystic fluid or radiodense gallstones. No biliary ductal dilatation.  Atrophic kidneys with perinephric fat stranding.  Focal lesion detection is significantly limited given the motion.  There is suggestion of a 1.9 cm interpolar left renal lesion with thin peripheral calcification (image 26 of series 2).  No hydronephrosis or hydroureter.  No bowel obstruction.  Normal appendix.  Circumferential bladder wall thickening, nonspecific given  incomplete distension.  Vas deferens calcifications  suggest diabetes.  There is scattered atherosclerotic calcification of the aorta and its branches. No aneurysmal dilatation.  There is peri-anal gas and a more focal pocket measuring 3.2 x 1.3 cm on series 2 image 80. Air tracks superiorly along the left iliac vessels and left para-aortic retroperitoneum.  Inferiorly, extends throughout the perineum, the medial right thigh, the right hemiscrotum and penis, partially imaged.  IMPRESSION: Findings are concerning for Fournier gangrene, with gas noted to track along soft tissues of the penis, right hemiscrotum, perineum, right thigh, and left retroperitoneum.  There is a perianal gas collection which may reflect a perianal abscess as the potential source of infection. Emergent urologic consultation recommended.  Critical Value/emergent results were called by telephone at the time of interpretation on February 17, 2012  at 06:15 a.m.  to  Dr. Read Drivers, who verbally acknowledged these results.  1.9 cm interpolar left renal lesion with thin peripheral calcification. Recommend ultrasound follow-up.  Original Report Authenticated By: Waneta Martins, M.D.   US Renal Port  02/18/2012  *RADIOLOGY REPORT*  Clinical Data: Acute renal failure.  History of hypertension and HIV infection.  Question hydronephrosis.  RENAL/URINARY TRACT ULTRASOUND COMPLETE - PORTABLE  Comparison:  Abdominal CT 02/17/2012.  Abdominal ultrasound 07/13/2010.  Findings:  Right Kidney:  There is cortical thinning with diffusely increased echogenicity.  No hydronephrosis or focal renal abnormality is identified.  Renal length is 8.3 cm.  Left Kidney:  There is cortical thinning with diffusely increased echogenicity.  No hydronephrosis is present.  There is a small cyst in the interpolar region measuring 12 mm maximally.  Renal length is 9.7 cm.  Bladder:  Decompressed by Foley catheter.  IMPRESSION:  1.  Stable renal ultrasound with bilaterally increased  echogenicity consistent with chronic medical renal disease. 2.  No hydronephrosis.  Original Report Authenticated By: Gerrianne Scale, M.D.   Dg Chest Port 1 View  02/22/2012  *RADIOLOGY REPORT*  Clinical Data: Pulmonary edema.  PORTABLE CHEST - 1 VIEW  Comparison: Chest x-ray 02/20/2012.  Findings: There is a right internal jugular perm catheter with tips terminating in the distal superior vena cava and superior aspect of the right atrium.  Previously noted left internal jugular central venous catheter has been removed.  No pneumothorax.  Lung volumes have decreased.  There are increasing bibasilar opacities favored to represent areas of atelectasis.  Minimal blunting of the left costophrenic sulcus could suggest a small left-sided pleural effusion.  Pulmonary venous congestion without frank pulmonary edema.  Mild cardiomegaly is unchanged.  Mediastinal contours are unremarkable.  Atherosclerotic calcifications within the arch of the aorta.  IMPRESSION: 1.  Support apparatus, as above. 2.  Low lung volumes with bibasilar subsegmental atelectasis and small left-sided pleural effusion. 3.  Mild cardiomegaly with mild pulmonary venous congestion.  No frank pulmonary edema at this time. 4.  Atherosclerosis.  Original Report Authenticated By: Florencia Reasons, M.D.   Dg Chest Port 1 View  02/20/2012  *RADIOLOGY REPORT*  Clinical Data: Status post dialysis catheter placement.  PORTABLE CHEST - 1 VIEW  Comparison: Chest radiograph 02/20/2012 at 5:14 hours.  Findings: Current radiograph at 10:26 hours.  A right IJ dialysis catheter has been placed.  One of the catheter tips is in the mid to distal superior vena cava, and the other catheter tip is at the junction of the superior vena cava and right atrium.  Left IJ dialysis catheter is stable, terminating near the junction of the innominate vein and superior vena cava.  Stable  cardiomegaly. Pulmonary vascularity appears normal.  Aeration of both lungs is improved  compared to the chest radiograph performed earlier today. Decreasing left basilar atelectasis compared to earlier today. Negative for pulmonary edema.  Negative for pneumothorax.  IMPRESSION: Satisfactory position of right IJ dialysis catheter.  Negative for pneumothorax. Improved aeration of the lungs compared to most recent prior.  Original Report Authenticated By: Britta Mccreedy, M.D.   Dg Chest Port 1 View  02/20/2012  *RADIOLOGY REPORT*  Clinical Data: Hemoptysis and shortness of breath.  PORTABLE CHEST - 1 VIEW  Comparison: 02/19/2012  Findings: Left jugular dialysis catheter tip in the left innominate vein region.  Hazy densities at the left lung base.  Negative for a pneumothorax.  There are low lung volumes and the trachea is midline. Heart size is upper limits of normal.  IMPRESSION: Low lung volumes and probable left basilar atelectasis.  Dialysis catheter tip in the left innominate vein.  This catheter positioning is not optimal and may result in poor flows.  Original Report Authenticated By: Richarda Overlie, M.D.   Dg Chest Portable 1 View  02/19/2012  *RADIOLOGY REPORT*  Clinical Data: Hemoptysis.  PORTABLE CHEST - 1 VIEW  Comparison: Chest x-ray 02/18/2012.  Findings: There is a left-sided internal jugular central venous catheter with tip terminating in the near the confluence of the left innominate and superior vena cava. Lung volumes are low.  No consolidative airspace disease.  No definite pleural effusions. Pulmonary venous congestion without frank pulmonary edema.  Mild enlargement of the cardiopericardial silhouette, increased compared to prior examination. The patient is rotated to the right on today's exam, resulting in distortion of the mediastinal contours and reduced diagnostic sensitivity and specificity for mediastinal pathology.  Atherosclerotic calcifications within the arch of the aorta.  IMPRESSION: 1.  Support apparatus, as above. 2.  Low lung volumes with increasing enlargement of the  cardiopericardial silhouette (which may reflect increasing cardiac dilatation and/or the presence of pericardial effusion) and pulmonary venous congestion (without frank pulmonary edema). 3.  Atherosclerosis.  Original Report Authenticated By: Florencia Reasons, M.D.   Dg Chest Portable 1 View  02/18/2012  *RADIOLOGY REPORT*  Clinical Data: Follow up hemoptysis.  PORTABLE CHEST - 1 VIEW  Comparison: 02/18/2012  Findings: Left jugular central line tip is near the upper SVC. Heart size is upper limits of normal.  Few densities at the left lung base are suggestive for atelectasis.  Remainder of the lungs are clear.  No evidence for a pneumothorax.  Trachea is midline.  IMPRESSION:  Left basilar atelectasis.  Stable position of the central line.  Original Report Authenticated By: Richarda Overlie, M.D.   Dg Chest Port 1 View  02/18/2012  *RADIOLOGY REPORT*  Clinical Data: Respiratory failure  PORTABLE CHEST - 1 VIEW  Comparison: 02/17/2012  Findings: Left IJ catheter terminates at the junction of the left brachiocephalic vein and superior vena cava, directed laterally. Cardiomegaly is stable.  Lung volumes are low.  There is mild peribronchial thickening at the lung bases.  Otherwise, the lungs appear clear.  No visible pleural effusion or pneumothorax.  IMPRESSION: Mild peribronchial thickening at the lung bases.  Otherwise, the lungs appear clear.  Original Report Authenticated By: Britta Mccreedy, M.D.   Dg Chest Port 1 View  02/17/2012  *RADIOLOGY REPORT*  Clinical Data: Line placement  PORTABLE CHEST - 1 VIEW  Comparison: 02/17/2012 at 1536 hours  Findings: Left IJ dual lumen catheter with its tip at the junction of the left brachiocephalic vein and  SVC.  Lungs are essentially clear. No pleural effusion or pneumothorax.  Mild cardiomegaly.  IMPRESSION: Left IJ dual lumen catheter with its tip at the junction of the left brachiocephalic vein and SVC.  No pneumothorax.  Original Report Authenticated By: Charline Bills, M.D.   Dg Chest Port 1 View  02/17/2012  *RADIOLOGY REPORT*  Clinical Data: Pulmonary edema  PORTABLE CHEST - 1 VIEW  Comparison: None.  Findings: Cardiomediastinal silhouette is unremarkable.  No acute infiltrate or pleural effusion.  No pulmonary edema.  Bony thorax is unremarkable.  IMPRESSION: No active disease.  Original Report Authenticated By: Natasha Mead, M.D.   Dg Fluoro Guide Cv Line-no Report  02/20/2012  CLINICAL DATA: diatec insertion, right   FLOURO GUIDE CV LINE  Fluoroscopy was utilized by the requesting physician.  No radiographic  interpretation.     Dg C-arm 1-60 Min  02/17/2012  *RADIOLOGY REPORT*  Clinical Data:  Fournier's gangrene  RETROGRADE URETHROGRAM  Comparison:  None.  Fluoroscopy Time:  45 seconds  This exam was performed by Dr. Sherron Monday.  Findings:  Two spot fluoroscopic views of the lower pelvis were performed.  On image #1, there is a small-bore catheter projecting over the penis, with contrast in the urinary bladder.  Bladder is normal in shape. Contrast is seen with an irregular shape, projecting over the expected location of the penis.  It is uncertain if this is some contrast extravasation within the soft tissues of the penis, distal urethral injury, or contrast contamination outside of the patient.  The second image shows a larger bore Foley catheter satisfactorily positioned, with the balloon inflated in the lower urinary bladder and contrast within the urinary bladder.  The previously described contrast extravasation over the penile region is no longer present. Question if it could be in a contamination adjacent to the patient. Numerous irregular locules of gas project over the perineum, consistent with the Fournier's gangrene seen by CT.  IMPRESSION: Two intraoperative images are submitted as described above.  Please also see Dr. Mina Marble report.  Original Report Authenticated By: Britta Mccreedy, M.D.   Medications: Scheduled Meds:    . antiseptic  oral rinse  15 mL Mouth Rinse BID  . calcium acetate  1,334 mg Oral TID WC  . darbepoetin (ARANESP) injection - DIALYSIS  100 mcg Intravenous Q Wed-HD  . lamiVUDine  50 mg Oral Daily  . lopinavir-ritonavir  5 mL Oral BID WC  . pantoprazole sodium  40 mg Oral Q1200  . paricalcitol  5 mcg Intravenous 3 times weekly  . piperacillin-tazobactam (ZOSYN)  IV  2.25 g Intravenous Q8H  . sodium chloride irrigation  1,000 mL Irrigation BID  . Zidovudine  300 mg Oral Daily   Continuous Infusions:    . sodium chloride 20 mL/hr (02/27/12 0021)   PRN Meds:.sodium chloride, sodium chloride, feeding supplement (NEPRO CARB STEADY), heparin, heparin, lidocaine, lidocaine-prilocaine, morphine injection, ondansetron, pentafluoroprop-tetrafluoroeth  ASSESSMENT & PLAN: Principal Problem:  *Sepsis secondary to suspected Fornier's gangrene Active Problems:  HIV INFECTION  ARF (acute renal failure) in the setting of stage III CKD  Metabolic acidosis  Hyperkalemia  Normocytic anemia  Hyponatremia  Acute respiratory failure   Fournier gangrene/perirectal abscess -Status post debridement by Dr. Sherron Monday on 02/17/2012. Please advise if any plans for re-debridement -Cultures are still negative to date including deep tissue cultures and blood cultures. -Patient is on Zosyn only now, appreciate Dr. Ilsa Iha help. Patient was on Zosyn, vancomycin and clindamycin.  Sepsis -This is resolved, patient is normotensive  now. -Secondary to Fournier gangrene, patient was on Zosyn, vancomycin and clindamycin. -Currently on Zosyn only. Infectious disease helping with antibiotics recommendation. -ID recommended Zosyn for a total 14 days, then convert to her regimen,. -Please advise if Elita Quick is appropriate, because of the convenience of administration with hemodialysis.  End-stage renal disease -Patient has chronic kidney disease which is progress during this hospitalization to ESRD. -Patient started on hemodialysis  by nephrology. -Patient has a right IJ 23 cm Diatek catheter as access for dialysis. -He was being set up to receive dialysis at [facility] dialysis unit on T/T/S second shift  HIV/AIDS -Appreciate infectious disease help, patient is on Kaletra, lamivudine and zidovudine which is renally dosed. -CD4 count is 180.   Hepatitis C -Normal AST/ALT. And normal INR.  Metabolic acidosis -Patient presented with severe metabolic acidosis, his bicarbonate was 7, his ABGs showed pH of 7.12. -That was accompanied by respiratory distress to compensate for the metabolic acidosis. -Patient did not require mechanical ventilation.  Acute respiratory failure -This is accompanied the severe metabolic acidosis as ventilatory failure. -As mentioned above patient did not require intubation or mechanical ventilation.  Hemoptysis -Reported that he coughed up large blood clot after surgery. -It's unclear to me, if it is secondary to minor trauma from intubation during anesthesia. Or it is secondary to platelets dysfunction from uremia. It might be both. -No reported hemoptysis recently.  Altered mental status -Secondary to SIRS and uremia, this is improved with antibiotics, wound debridement and hemodialysis.  Disposition -Remains inpatient. -Vascular surgery plans for AV graft when WBC is down, likely it will be as outpatient -Urology plans for redoing the debridement noted, patient to the OR again.   LOS: 10 days   Neveah Bang A 02/27/2012, 10:00 AM

## 2012-02-27 NOTE — Progress Notes (Signed)
Patient pulled tubing apart on his foley cath multiple times tonight. Replaced the foley bag every time patient did this. Patient also unpacked his dressing twice tonight. Repacked dressing both times with irrigation. Patient also removed his dressing over his right chest HD cath. IV team replaced the dressing this AM. Asked the patient why he was removing these devices he stated that he has been sleep and has not done anything. Explained to patient that the removal of these devices improperly can cause infection and we want to keep him safe. Patient stated that he understood. Will continue to monitor.  Doristine Devoid RN

## 2012-02-27 NOTE — Progress Notes (Signed)
PT Cancellation Note  Treatment cancelled today due to patient's refusal to participate. Politely declining OOB activity/walking today.    [name] [name] 02/27/2012, 3:45 PM

## 2012-02-27 NOTE — Clinical Social Work Psychosocial (Signed)
Clinical Social Work Department BRIEF PSYCHOSOCIAL ASSESSMENT 02/27/2012  Patient:  Bryan Gilmore, Bryan Gilmore     Account Number:  [id number]     Admit date:  02/17/2012  Clinical Social Worker:  Delmer Islam  Date/Time:  02/27/2012 06:09 AM  Referred by:  RN  Date Referred:  02/27/2012 Referred for  SNF Placement   Other Referral:   Order had been placed for LTAC placment. [facility] looking at patient for placement however he may not be able to afford the out-of-pocket expenses.   Interview type:  Patient Other interview type:    PSYCHOSOCIAL DATA Living Status:  ALONE Admitted from facility:   Level of care:   Primary support name:  Bryan Gilmore Primary support relationship to patient:  FAMILY Degree of support available:   Mr. Bryan Gilmore is patient's nephew - ph# [phone]    CURRENT CONCERNS Current Concerns  Post-Acute Placement   Other Concerns:   Patient's ability to afford out-of-pocket expenses with LTAC or SNF placement.    SOCIAL WORK ASSESSMENT / PLAN CSW and RN case manager talked with patient about discharge plans. Explained that another discharge option needed if he is unable to go to LTAC due to cost. Patient confirmed that he lives alone and agreed that he needs more care after discharge froom hospital. When asked, patient advised that he has a nephew that checks in on him at times and gave his name. CSW talked with patient about nephew assisting him with choosing a facility.   Assessment/plan status:   Other assessment/ plan:   Information/referral to community resources:   Patient given skilled facility list for [facility].    PATIENT'S/FAMILY'S RESPONSE TO PLAN OF CARE: Patient open to talking with CSW and RN case manager about discharge plans and he appears to understand that he does not have the help he needs at home and agrees to a skilled facility search.

## 2012-02-28 ENCOUNTER — Encounter (HOSPITAL_COMMUNITY): Payer: Self-pay | Admitting: Anesthesiology

## 2012-02-28 ENCOUNTER — Inpatient Hospital Stay (HOSPITAL_COMMUNITY): Payer: BC Managed Care – PPO

## 2012-02-28 DIAGNOSIS — B20 Human immunodeficiency virus [HIV] disease: Secondary | ICD-10-CM | POA: Diagnosis not present

## 2012-02-28 DIAGNOSIS — N186 End stage renal disease: Secondary | ICD-10-CM | POA: Diagnosis not present

## 2012-02-28 DIAGNOSIS — A413 Sepsis due to Hemophilus influenzae: Secondary | ICD-10-CM | POA: Diagnosis not present

## 2012-02-28 DIAGNOSIS — L02818 Cutaneous abscess of other sites: Secondary | ICD-10-CM

## 2012-02-28 DIAGNOSIS — L03818 Cellulitis of other sites: Secondary | ICD-10-CM

## 2012-02-28 LAB — RENAL FUNCTION PANEL
CO2: 24 mEq/L (ref 19–32)
Chloride: 91 mEq/L — ABNORMAL LOW (ref 96–112)
GFR calc Af Amer: 9 mL/min — ABNORMAL LOW (ref >90)
GFR calc non Af Amer: 7 mL/min — ABNORMAL LOW (ref >90)
Sodium: 131 mEq/L — ABNORMAL LOW (ref 135–145)

## 2012-02-28 LAB — GLUCOSE, CAPILLARY
Glucose-Capillary: 91 mg/dL (ref 70–99)
Glucose-Capillary: 137 mg/dL — ABNORMAL HIGH (ref 70–99)

## 2012-02-28 LAB — CBC
MCV: 86.3 fL (ref 78.0–100.0)
Platelets: 260 10*3/uL (ref 150–400)
RBC: 2.62 MIL/uL — ABNORMAL LOW (ref 4.22–5.81)
WBC: 9.6 10*3/uL (ref 4.0–10.5)

## 2012-02-28 MED ORDER — DARBEPOETIN ALFA-POLYSORBATE 100 MCG/0.5ML IJ SOLN
INTRAMUSCULAR | Status: AC
  Administered 2012-02-28: 100 ug via INTRAVENOUS
  Filled 2012-02-28: qty 0.5

## 2012-02-28 MED ORDER — MORPHINE SULFATE 2 MG/ML IJ SOLN
INTRAMUSCULAR | Status: AC
  Filled 2012-02-28: qty 1

## 2012-02-28 MED ORDER — PARICALCITOL 5 MCG/ML IV SOLN
INTRAVENOUS | Status: AC
  Administered 2012-02-28: 5 ug via INTRAVENOUS
  Filled 2012-02-28: qty 1

## 2012-02-28 MED FILL — Sodium Hypochlorite Soln 0.25%: CUTANEOUS | Qty: 473 | Status: AC

## 2012-02-28 NOTE — Progress Notes (Signed)
Subjective:   Seen on HD today. I see plans for possible operative debridement but dont know when.  I agree that we may need to hold off on AVG placement for safety reasons.  BP has been high Objective Vital signs in last 24 hours: Filed Vitals:   02/27/12 2057 02/28/12 0600 02/28/12 0826 02/28/12 0903  BP: 177/87 175/101 174/92 159/93  Pulse: 76 89 86 88  Temp: 100.3 F (37.9 C) 99.1 F (37.3 C) 101 F (38.3 C)   TempSrc: Oral Oral Oral   Resp: 19 18 12 20   Height:      Weight: 102.4 kg (225 lb 12 oz)  102.4 kg (225 lb 12 oz)   SpO2: 100% 100% 100%    Weight change: -1.1 kg (-2 lb 6.8 oz)  Intake/Output Summary (Last 24 hours) at 02/28/12 0955 Last data filed at 02/28/12 0800  Gross per 24 hour  Intake    690 ml  Output    800 ml  Net   -110 ml   Labs: Basic Metabolic Panel:  Lab 02/26/12 1610 02/23/12 0802 02/22/12 0340 02/21/12 1458  NA 133* 136 136 --  K 4.1 3.7 3.6 --  CL 92* 98 100 --  CO2 23 26 27  --  GLUCOSE 112* 96 128* --  BUN 39* 27* 15 --  CREATININE 7.62* 5.13* 2.88* --  CALCIUM 8.6 8.3* 7.9* --  ALB -- -- -- --  PHOS 7.1* 5.0* -- 5.6*   Liver Function Tests:  Lab 02/26/12 0751 02/23/12 0802 02/21/12 1458  AST -- -- --  ALT -- -- --  ALKPHOS -- -- --  BILITOT -- -- --  PROT -- -- --  ALBUMIN 1.5* 1.4* 1.3*   No results found for this basename: LIPASE:3,AMYLASE:3 in the last 168 hours No results found for this basename: AMMONIA:3 in the last 168 hours CBC:  Lab 02/26/12 0750 02/25/12 0537 02/23/12 0333 02/22/12 1929 02/22/12 0340  WBC 12.6* 13.4* 11.9* -- --  NEUTROABS -- 10.9* -- -- --  HGB 8.2* 8.1* 8.0* -- --  HCT 24.2* 23.9* 22.3* -- --  MCV 85.5 85.4 81.7 81.4 80.8  PLT 206 167 110* -- --   Cardiac Enzymes: No results found for this basename: CKTOTAL:5,CKMB:5,CKMBINDEX:5,TROPONINI:5 in the last 168 hours CBG:  Lab 02/28/12 0741 02/27/12 2007 02/27/12 1145 02/27/12 0747 02/27/12 0413  GLUCAP 91 107* 113* 93 107*    Iron Studies:  No results found for this basename: IRON,TIBC,TRANSFERRIN,FERRITIN in the last 72 hours Studies/Results: No results found. Medications: Infusions:    . sodium chloride 20 mL/hr (02/27/12 0021)    Scheduled Medications:    . antiseptic oral rinse  15 mL Mouth Rinse BID  . calcium acetate  1,334 mg Oral TID WC  . darbepoetin (ARANESP) injection - DIALYSIS  100 mcg Intravenous Q Wed-HD  . lamiVUDine  50 mg Oral Daily  . lopinavir-ritonavir  5 mL Oral BID WC  . morphine      . pantoprazole sodium  40 mg Oral Q1200  . paricalcitol  5 mcg Intravenous 3 times weekly  . piperacillin-tazobactam (ZOSYN)  IV  2.25 g Intravenous Q8H  . sodium chloride irrigation  1,000 mL Irrigation BID  . Zidovudine  300 mg Oral Daily    have reviewed scheduled and prn medications.  Physical Exam: General: alert, no new complaints Heart: RRR Lungs: mostly clear Abdomen: soft, nontender Extremities: no edema .  Groin not examined Dialysis Access: PC   I Assessment/ Plan: Pt  is a 61 y.o. yo male who was admitted on 02/17/2012 with  Fournier's gangrene and new start to HD Assessment/Plan: 1. 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 Continue HD , now on MWF schedule but will need to be transitioned to TTS (schedule at [facility])  From his vein mapping, it appears that AVG is his only option- plan up in the air per VVS. HD via PC for now.  Will need to arrange HD around  AVG placement and possible groin debridement. HD today, then could maybe wait until Saturday for next treatment 2. Anemia- stable s/p PRBC transfusion- monitor with perineal wound site losses. On aranesp as well 3. Fournier's gangrene- s/p I&D and ongoing daily evaluations by urology, seen by ID and recommendations noted.  On zosyn 4. HTN/vol- BP higher today also with some edema, have increased goal on HD - no BP meds yet 5. Hep C/HIV- on ARVs per ID recommendations 6. AMS - Improved with HD/wound management 7. Bones- PTH 938,  phos 7.1, calc 8.6- zemplar,  phoslo 8. Dispo-  Not sure what date of discharge might be.  Is set up at [facility] for HD, needs AVG, would like to do prior to discharge if possible   Mckaela Howley A   02/28/2012,9:55 AM  LOS: 11 days

## 2012-02-28 NOTE — H&P (Signed)
Urology Consult  Referring physician: [place]  Reason for referral: Stricture/fournieres  Chief Complaint: Stricture; can't pass catheter  History of Present Illness: 30 male; home on bactrim; swollen penis and scrotum; could not pass catheter; air on CT scan; now to be in acute on chronic renal failure; difficulty urinating for 2 months  HIV disease; intermittent rt flank and abdomina lpain; radiates to testicle on right  More difficult to void x one week  Many answers to questions? Accurate ? Previous GU surgery and UTI  Modifying factors: There are no other modifying factors  Associated signs and symptoms: There are no other associated signs and symptoms  Aggravating and relieving factors: There are no other aggravating or relieving factors  Severity: Moderate  Duration: Persistent  Past Medical History   Diagnosis  Date   .  Hypertension    .  Renal disorder    .  HIV (human immunodeficiency virus infection)     History reviewed. No pertinent past surgical history.  Medications: I have reviewed the patient's current medications.  Allergies: No Known Allergies  History reviewed. No pertinent family history.  Social History: reports that he has never smoked. He has never used smokeless tobacco. He reports that he does not drink alcohol or use illicit drugs.  ROS: All systems are reviewed and negative except as noted.  Physical Exam:  Vital signs in last 24 hours:  Temp: [97.6 F (36.4 C)-98 F (36.7 C)] 97.6 F (36.4 C) (06/08 0734)  Pulse Rate: [88-95] 88 (06/08 0734)  Resp: [18-26] 23 (06/08 0734)  BP: (137-156)/(66-94) 137/66 mmHg (06/08 0734)  SpO2: [94 %-100 %] 100 % (06/08 0734)  Cardiovascular: Skin warm; not flushed  Respiratory: Breaths quiet; no shortness of breath  Abdomen: No masses  Neurological: Normal sensation to touch  Musculoskeletal: Normal motor function arms and legs  Lymphatics: No inguinal adenopathy  Skin: No rashes  Genitourinary:Mild swellling  perineum and rt scrotum; firm area anterior to rectum on DRE; belly benign  Laboratory Data:  Results for orders placed during the hospital encounter of 02/17/12 (from the past 72 hour(s))   BASIC METABOLIC PANEL Status: Abnormal    Collection Time    02/17/12 6:16 AM   Component  Value  Range  Comment    Sodium  129 (*)  135 - 145 (mEq/L)     Potassium  5.8 (*)  3.5 - 5.1 (mEq/L)     Chloride  97  96 - 112 (mEq/L)     CO2  7 (*)  19 - 32 (mEq/L)     Glucose, Bld  93  70 - 99 (mg/dL)     BUN  161 (*)  6 - 23 (mg/dL)     Creatinine, Ser  0.96 (*)  0.50 - 1.35 (mg/dL)     Calcium  9.0  8.4 - 10.5 (mg/dL)     GFR calc non Af Amer  6 (*)  >90 (mL/min)     GFR calc Af Amer  7 (*)  >90 (mL/min)    CBC Status: Abnormal (Preliminary result)    Collection Time    02/17/12 6:16 AM   Component  Value  Range  Comment    WBC  PENDING  4.0 - 10.5 (K/uL)     RBC  3.06 (*)  4.22 - 5.81 (MIL/uL)     Hemoglobin  9.1 (*)  13.0 - 17.0 (g/dL)  REPEATED TO VERIFY    HCT  26.3 (*)  39.0 - 52.0 (%)  MCV  85.9  78.0 - 100.0 (fL)     MCH  29.7  26.0 - 34.0 (pg)     MCHC  34.6  30.0 - 36.0 (g/dL)     RDW  16.1  09.6 - 15.5 (%)     Platelets  PENDING  150 - 400 (K/uL)    DIFFERENTIAL Status: Normal (Preliminary result)    Collection Time    02/17/12 6:16 AM   Component  Value  Range  Comment    Neutrophils Relative  PENDING  43 - 77 (%)     Neutro Abs  PENDING  1.7 - 7.7 (K/uL)     Band Neutrophils  PENDING  0 - 10 (%)     Lymphocytes Relative  PENDING  12 - 46 (%)     Lymphs Abs  PENDING  0.7 - 4.0 (K/uL)     Monocytes Relative  PENDING  3 - 12 (%)     Monocytes Absolute  PENDING  0.1 - 1.0 (K/uL)     Eosinophils Relative  PENDING  0 - 5 (%)     Eosinophils Absolute  PENDING  0.0 - 0.7 (K/uL)     Basophils Relative  PENDING  0 - 1 (%)     Basophils Absolute  PENDING  0.0 - 0.1 (K/uL)     WBC Morphology  PENDING      RBC Morphology  PENDING      Smear Review  PENDING      nRBC  PENDING  0 (/100 WBC)      Metamyelocytes Relative  PENDING      Myelocytes  PENDING      Promyelocytes Absolute  PENDING      Blasts  PENDING     PROCALCITONIN Status: Normal    Collection Time    02/17/12 6:20 AM   Component  Value  Range  Comment    Procalcitonin  10.04     LACTIC ACID, PLASMA Status: Normal    Collection Time    02/17/12 6:51 AM   Component  Value  Range  Comment    Lactic Acid, Venous  1.7  0.5 - 2.2 (mmol/L)     No results found for this or any previous visit (from the past 240 hour(s)).  Creatinine:   Basename  02/17/12 0616   CREATININE  8.63*    Xrays:  See report/chart  Reviewed in person with radiology- agree could be anal souorse  Impression/Assessment:  Perirectal/perinal abscess  Very acidotic  Renal failure  Fourniere's  General surgery/urology to OR  Medicine has seen  Plan:  See above   After a thorough review of the management options for the patient's condition the patient  elected to proceed with surgical therapy as noted above. We have discussed the potential benefits and risks of the procedure, side effects of the proposed treatment, the likelihood of the patient achieving the goals of the procedure, and any potential problems that might occur during the procedure or recuperation. Informed consent has been obtained.

## 2012-02-28 NOTE — Progress Notes (Addendum)
[facility] PROGRESS NOTE  Bryan Gilmore:[id number] DOB: Jan 31, 1951 DOA: 02/17/2012 PCP: Sheila Oats, MD  Brief narrative: 61 year old male with HIV/AIDS (CD4 180, VL ~190,000), now ESRD on HD who was admitted 02/17/2012 for testicular discomfort and swelling and 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 Patient was found to have evolving CKD and was subsequently transferred to [facility] under PCCM. [name] now requires HD and plan is for AVG once I&D completed.  Principal Problem: *Fournier gangrene/perirectal abscess  -Status post debridement by Dr. Sherron Monday on 02/17/2012. - Plan for I&Dagain tomorrow am - Cultures are still negative to date including deep tissue cultures and blood cultures.  - Patient was on Zosyn, vancomycin and clindamycin but at this time only on zosyn  Active Problems: Sepsis secondary to fournier gangrene and perirectal abscess - resolved, patient is hemodynamically stable and does not require pressors - currently only on zosyn - appreciate ID following   End-stage renal disease on HD, metabolic acidosis - HD per renal schedule - Plan for AVG once I&D completed; appreciate vascular surgery following -Patient has a right IJ 23 cm Diatek catheter as access for dialysis.  -He was being set up to receive dialysis at [facility] dialysis unit on T/T/S second shift   HIV/AIDS, CD4 count <200 (CD4 180, VL 190,000) - currently on retrovir, lamivudine and kaletra - appreciate ID following - ? Bactrim for PCP prophylaxis  Hepatitis C  -Normal AST/ALT. And normal INR.   Acute respiratory failure  - resolved, respiratory status stable at this time - This is accompanied the severe metabolic acidosis as ventilatory failure.  -  patient did not require intubation or mechanical ventilation.   Hemoptysis  - Reported that he coughed up large blood clot after surgery.  - secondary to minor trauma from intubation  during anesthesia. Or it is secondary to platelets dysfunction from uremia. It might be both.  - No reported hemoptysis recently.   Altered mental status  -Secondary to SIRS and uremia, this is improved with antibiotics, wound debridement and hemodialysis.  Anemia of chronic kidney disease - aranesp with HD  Hyperphosphatemia - continue zemplar  Severe protein calorie malnutrition - nutrition consult  Consultants:  Nephrology  Vascular surgery  Urology  Infectious disease  PCCM  Procedures:  I and D plan for 02/29/2012  AVG plan after I&D done  Antibiotics:  Retrovir 02/23/2012 -->  Lamivudine 02/23/2012 -->  Kaletra 02/18/2012 -->  Zosyn 02/20/2012 -->  Code Status: full code Family Communication: updated at bedside Disposition Plan: pending PT evaluation  Subjective: No acute events overnight  Objective: Filed Vitals:   02/28/12 0903 02/28/12 0930 02/28/12 1000 02/28/12 1030  BP: 159/93 191/105 167/74 159/77  Pulse: 88 95 89 85  Temp:      TempSrc:      Resp: 20 16 17 18   Height:      Weight:      SpO2:        Intake/Output Summary (Last 24 hours) at 02/28/12 1132 Last data filed at 02/28/12 0800  Gross per 24 hour  Intake    690 ml  Output    800 ml  Net   -110 ml    Exam:   General:  Pt is alert, follows commands appropriately, not in acute distress  Cardiovascular: Regular rate and rhythm, S1/S2, no murmurs, no rubs, no gallops  Respiratory: Clear to auscultation bilaterally, no wheezing, no crackles, no rhonchi  Abdomen: Soft, non tender,  non distended, bowel sounds present, no guarding  Extremities: No edema, pulses DP and PT palpable bilaterally  Neuro: Grossly nonfocal  Data Reviewed: Basic Metabolic Panel:  Lab 02/26/12 1610 02/23/12 0802 02/22/12 0340 02/21/12 1458  NA 133* 136 136 134*  K 4.1 3.7 3.6 5.0  CL 92* 98 100 99  CO2 23 26 27 23   GLUCOSE 112* 96 128* 105*  BUN 39* 27* 15 35*  CREATININE 7.62* 5.13* 2.88*  4.55*  CALCIUM 8.6 8.3* 7.9* 7.8*  MG -- -- -- --  PHOS 7.1* 5.0* -- 5.6*   Liver Function Tests:  Lab 02/26/12 0751 02/23/12 0802 02/21/12 1458  AST -- -- --  ALT -- -- --  ALKPHOS -- -- --  BILITOT -- -- --  PROT -- -- --  ALBUMIN 1.5* 1.4* 1.3*   CBC:  Lab 02/28/12 1103 02/26/12 0750 02/25/12 0537 02/23/12 0333 02/22/12 1929  WBC 9.6 12.6* 13.4* 11.9* 13.5*  HGB 7.6* 8.2* 8.1* 8.0* 8.2*  HCT 22.6* 24.2* 23.9* 22.3* 23.6*  MCV 86.3 85.5 85.4 81.7 81.4  PLT 260 206 167 110* 115*   CBG:  Lab 02/28/12 0741 02/27/12 2007 02/27/12 1145 02/27/12 0747 02/27/12 0413  GLUCAP 91 107* 113* 93 107*    No results found for this or any previous visit (from the past 240 hour(s)).   Studies: No results found.  Scheduled Meds:   . antiseptic oral rinse  15 mL Mouth Rinse BID  . calcium acetate  1,334 mg Oral TID WC  .  (ARANESP) injection - HD  100 mcg Intravenous Q Wed-HD  . lamiVUDine  50 mg Oral Daily  . lopinavir-ritonavir  5 mL Oral BID WC  . pantoprazole sodium  40 mg Oral Q1200  . paricalcitol  5 mcg Intravenous 3 times weekly  . piperacillin-tazobactam (ZOSYN)  IV  2.25 g Intravenous Q8H  . Zidovudine  300 mg Oral Daily   Continuous Infusions:   . sodium chloride 20 mL/hr (02/27/12 0021)      Debbora Presto, MD  [facility] Pager [phone]  If 7PM-7AM, please contact night-coverage [email or web address] Password [email or web address] 02/28/2012, 11:32 AM   LOS: 11 days

## 2012-02-28 NOTE — Progress Notes (Signed)
VASCULAR SURGERY  As per Urology's lst note, they plan irrigation/debridement under anesthesia and partial wound closure. I definitely would not want to proceed with AVG at the same time and it would be best to place AVG 24-48 hours after this is done because of the bacteremia that will result from I&D of wound. Likewise, I do not want to proceed with AVG prior to debridement of wound as the wound still has some necrotic tissue and pt would clearly be at increased risk for graft infection. All things considered, I think that it would be safest for him if graft is placed as an outpt once the wound has made significant improvement.   Di Kindle. Edilia Bo, MD, FACS Beeper [phone] 02/28/2012

## 2012-02-28 NOTE — Progress Notes (Signed)
Pt's scrotal incision was irrigated with normal saline, and packed with gauze moistened with Dakin's solution. The measurements of the incision are: 7cmx4cm, with 2.5cm of tunneling. Pt tolerated dressing change well. An ABD was loosely tapes over the dressing to keep it in place.

## 2012-02-28 NOTE — Progress Notes (Signed)
PT Cancellation Note     Treatment cancelled today due to patient's polite refusal to participate.  02/28/2012  Ashton Bing, PT [phone] (pager)

## 2012-02-28 NOTE — Clinical Social Work Note (Signed)
A representative from [facility] came to hospital to get clinical information on patient for insurance BCBS preauthorization. Per representative, [facility] is willing to work with patient as he will apply for Medicaid and once approved they will be paid. CSW will continue to follow.  Genelle Bal, MSW, LCSW [phone]

## 2012-02-28 NOTE — Procedures (Signed)
Patient was seen on dialysis and the procedure was supervised.  BFR 350  Via PC BP is  190/100.   Patient appears to be tolerating treatment well, will increase goal for increased BP  Bryan Gilmore A 02/28/2012

## 2012-02-28 NOTE — Progress Notes (Signed)
I spoke to primary team Plan is to do wound irrigation and debridement tomorrow at noon at [facility] NPO midnight

## 2012-02-29 ENCOUNTER — Inpatient Hospital Stay (HOSPITAL_COMMUNITY): Payer: BC Managed Care – PPO | Admitting: Anesthesiology

## 2012-02-29 ENCOUNTER — Encounter (HOSPITAL_COMMUNITY): Payer: Self-pay | Admitting: Anesthesiology

## 2012-02-29 ENCOUNTER — Encounter (HOSPITAL_COMMUNITY)
Admission: EM | Disposition: A | Discharge status: Other Acute Inpatient Hospital | Payer: Self-pay | Source: Home / Self Care | Attending: Pulmonary Disease

## 2012-02-29 DIAGNOSIS — N189 Chronic kidney disease, unspecified: Secondary | ICD-10-CM | POA: Diagnosis present

## 2012-02-29 DIAGNOSIS — N186 End stage renal disease: Secondary | ICD-10-CM

## 2012-02-29 DIAGNOSIS — L02818 Cutaneous abscess of other sites: Secondary | ICD-10-CM

## 2012-02-29 DIAGNOSIS — A413 Sepsis due to Hemophilus influenzae: Secondary | ICD-10-CM

## 2012-02-29 DIAGNOSIS — L03818 Cellulitis of other sites: Secondary | ICD-10-CM

## 2012-02-29 DIAGNOSIS — B20 Human immunodeficiency virus [HIV] disease: Secondary | ICD-10-CM

## 2012-02-29 HISTORY — PX: IRRIGATION AND DEBRIDEMENT ABSCESS: SHX5252

## 2012-02-29 LAB — CBC
MCH: 29.2 pg (ref 26.0–34.0)
MCV: 86.7 fL (ref 78.0–100.0)
Platelets: 271 10*3/uL (ref 150–400)
RBC: 2.71 MIL/uL — ABNORMAL LOW (ref 4.22–5.81)
RDW: 18.1 % — ABNORMAL HIGH (ref 11.5–15.5)

## 2012-02-29 LAB — BASIC METABOLIC PANEL
CO2: 25 mEq/L (ref 19–32)
Calcium: 8.8 mg/dL (ref 8.4–10.5)
Creatinine, Ser: 4.79 mg/dL — ABNORMAL HIGH (ref 0.50–1.35)
GFR calc Af Amer: 14 mL/min — ABNORMAL LOW (ref >90)
GFR calc non Af Amer: 12 mL/min — ABNORMAL LOW (ref >90)
Sodium: 132 mEq/L — ABNORMAL LOW (ref 135–145)

## 2012-02-29 LAB — GLUCOSE, CAPILLARY: Glucose-Capillary: 90 mg/dL (ref 70–99)

## 2012-02-29 LAB — HIV-1 GENOTYPR PLUS: Resistance Assoc RT Mutations: NOT DETECTED

## 2012-02-29 SURGERY — IRRIGATION AND DEBRIDEMENT ABSCESS
Anesthesia: General | Site: Scrotum | Wound class: Dirty or Infected

## 2012-02-29 SURGERY — INSERTION OF ARTERIOVENOUS (AV) GORE-TEX GRAFT ARM
Anesthesia: Monitor Anesthesia Care | Site: Arm Lower | Laterality: Left

## 2012-02-29 MED ORDER — PROMETHAZINE HCL 25 MG/ML IJ SOLN: 6.2500 mg | INTRAMUSCULAR | Status: DC | PRN

## 2012-02-29 MED ORDER — MORPHINE SULFATE 2 MG/ML IJ SOLN: 1.0000 mg | INTRAMUSCULAR | Status: DC | PRN

## 2012-02-29 MED ORDER — MIDAZOLAM HCL 5 MG/5ML IJ SOLN
INTRAMUSCULAR | Status: DC | PRN
  Administered 2012-02-29: 2 mg via INTRAVENOUS

## 2012-02-29 MED ORDER — LIDOCAINE HCL (CARDIAC) 20 MG/ML IV SOLN
INTRAVENOUS | Status: DC | PRN
  Administered 2012-02-29: 70 mg via INTRAVENOUS

## 2012-02-29 MED ORDER — ONDANSETRON HCL 4 MG/2ML IJ SOLN
INTRAMUSCULAR | Status: DC | PRN
  Administered 2012-02-29: 4 mg via INTRAVENOUS

## 2012-02-29 MED ORDER — BOOST / RESOURCE BREEZE PO LIQD
1.0000 | Freq: Two times a day (BID) | ORAL | Status: DC
  Administered 2012-02-29 – 2012-03-01 (×3): 1 via ORAL

## 2012-02-29 MED ORDER — SODIUM CHLORIDE 0.9 % IV SOLN
INTRAVENOUS | Status: DC | PRN
  Administered 2012-02-29: 13:00:00 via INTRAVENOUS

## 2012-02-29 MED ORDER — FENTANYL CITRATE 0.05 MG/ML IJ SOLN
INTRAMUSCULAR | Status: DC | PRN
  Administered 2012-02-29: 50 ug via INTRAVENOUS

## 2012-02-29 MED ORDER — 0.9 % SODIUM CHLORIDE (POUR BTL) OPTIME
TOPICAL | Status: DC | PRN
  Administered 2012-02-29: 3000 mL
  Administered 2012-02-29: 1000 mL

## 2012-02-29 MED ORDER — PRO-STAT SUGAR FREE PO LIQD
30.0000 mL | Freq: Three times a day (TID) | ORAL | Status: DC
  Administered 2012-02-29 – 2012-03-01 (×4): 30 mL via ORAL
  Filled 2012-02-29 (×4): qty 30

## 2012-02-29 MED ORDER — RENA-VITE PO TABS
1.0000 | ORAL_TABLET | Freq: Every day | ORAL | Status: DC
  Administered 2012-02-29: 1 via ORAL
  Filled 2012-02-29 (×2): qty 1

## 2012-02-29 MED ORDER — PROPOFOL 10 MG/ML IV BOLUS
INTRAVENOUS | Status: DC | PRN
  Administered 2012-02-29: 70 mg via INTRAVENOUS

## 2012-02-29 SURGICAL SUPPLY — 34 items
BANDAGE GAUZE ELAST BULKY 4 IN (GAUZE/BANDAGES/DRESSINGS) ×1 IMPLANT
CANISTER SUCTION 2500CC (MISCELLANEOUS) ×1 IMPLANT
CLOTH BEACON ORANGE TIMEOUT ST (SAFETY) ×2 IMPLANT
COVER SURGICAL LIGHT HANDLE (MISCELLANEOUS) ×2 IMPLANT
DRAPE LAPAROSCOPIC ABDOMINAL (DRAPES) ×1 IMPLANT
DRAPE SURG 17X23 STRL (DRAPES) ×1 IMPLANT
DRAPE UTILITY 15X26 W/TAPE STR (DRAPE) ×4 IMPLANT
DRSG PAD ABDOMINAL 8X10 ST (GAUZE/BANDAGES/DRESSINGS) IMPLANT
ELECT CAUTERY BLADE 6.4 (BLADE) ×2 IMPLANT
ELECT REM PT RETURN 9FT ADLT (ELECTROSURGICAL) ×2
ELECTRODE REM PT RTRN 9FT ADLT (ELECTROSURGICAL) ×1 IMPLANT
GLOVE BIO SURGEON STRL SZ 6.5 (GLOVE) ×2 IMPLANT
GLOVE BIOGEL PI IND STRL 6.5 (GLOVE) IMPLANT
GLOVE BIOGEL PI INDICATOR 6.5 (GLOVE) ×2
GLOVE EUDERMIC 7 POWDERFREE (GLOVE) ×2 IMPLANT
GOWN STRL NON-REIN LRG LVL3 (GOWN DISPOSABLE) ×1 IMPLANT
GOWN STRL REIN XL XLG (GOWN DISPOSABLE) ×4 IMPLANT
HANDPIECE INTERPULSE COAX TIP (DISPOSABLE) ×2
KIT BASIN OR (CUSTOM PROCEDURE TRAY) ×2 IMPLANT
KIT ROOM TURNOVER OR (KITS) ×2 IMPLANT
NS IRRIG 1000ML POUR BTL (IV SOLUTION) ×2 IMPLANT
PACK GENERAL/GYN (CUSTOM PROCEDURE TRAY) ×2 IMPLANT
PACK LITHOTOMY IV (CUSTOM PROCEDURE TRAY) ×1 IMPLANT
PAD ARMBOARD 7.5X6 YLW CONV (MISCELLANEOUS) ×4 IMPLANT
SET HNDPC FAN SPRY TIP SCT (DISPOSABLE) IMPLANT
SPECIMEN JAR SMALL (MISCELLANEOUS) IMPLANT
SPONGE GAUZE 4X4 12PLY (GAUZE/BANDAGES/DRESSINGS) IMPLANT
SUT VIC AB 3-0 SH 27 (SUTURE) ×4
SUT VIC AB 3-0 SH 27XBRD (SUTURE) IMPLANT
SWAB COLLECTION DEVICE MRSA (MISCELLANEOUS) IMPLANT
TOWEL OR 17X24 6PK STRL BLUE (TOWEL DISPOSABLE) ×1 IMPLANT
TOWEL OR 17X26 10 PK STRL BLUE (TOWEL DISPOSABLE) ×2 IMPLANT
TUBE ANAEROBIC SPECIMEN COL (MISCELLANEOUS) IMPLANT
TUBE CONNECTING 12X1/4 (SUCTIONS) ×1 IMPLANT

## 2012-02-29 NOTE — Progress Notes (Signed)
Physical Therapy Treatment Patient Details Name: Bryan Gilmore MRN: [id number] DOB: 1950-10-18 Today's Date: 02/29/2012 Time: 0815-0829 PT Time Calculation (min): 14 min  PT Assessment / Plan / Recommendation Comments on Treatment Session  Activity tolerance limited today.  Unable to stand at side of bed.    Follow Up Recommendations  Home health PT;Supervision/Assistance - 24 hour;Other (comment) (as long as he can regain his functional mobility.)    Barriers to Discharge        Equipment Recommendations  Rolling walker with 5" wheels;3 in 1 bedside comode    Recommendations for Other Services    Frequency Min 3X/week   Plan Discharge plan remains appropriate    Precautions / Restrictions Precautions Precautions: Fall Restrictions Weight Bearing Restrictions: No   Pertinent Vitals/Pain No distress, no c/o pain    Mobility  Bed Mobility Bed Mobility: Supine to Sit;Sitting - Scoot to Edge of Bed Supine to Sit: 3: Mod assist Sitting - Scoot to Edge of Bed: 3: Mod assist Sit to Supine: 3: Mod assist Details for Bed Mobility Assistance: Slow moving, required assist for foley and rectal tube Transfers Transfers: Sit to Stand;Stand to Sit Sit to Stand: 1: +2 Total assist;From bed;With upper extremity assist;From elevated surface Sit to Stand: Patient Percentage: 40% Stand to Sit: 1: +2 Total assist;With upper extremity assist;To bed;To elevated surface Stand to Sit: Patient Percentage: 40% Details for Transfer Assistance: Pt. unable to clear his hips from bed today even with bed elevated .He appears weaker for his functional mobility tasks today.      Exercises General Exercises - Lower Extremity Ankle Circles/Pumps: AROM;Both;10 reps;Supine   PT Diagnosis:    PT Problem List:   PT Treatment Interventions:     PT Goals Acute Rehab PT Goals PT Goal: Supine/Side to Sit - Progress: Progressing toward goal PT Goal: Sit to Supine/Side - Progress: Progressing toward goal PT  Goal: Sit to Stand - Progress: Not progressing PT Goal: Stand to Sit - Progress: Not progressing  Visit Information  Last PT Received On: 02/29/12 Assistance Needed: +2    Subjective Data  Subjective: Pt. did not want to try to walk, but agreeable to work on standing.   Cognition  Overall Cognitive Status: Appears within functional limits for tasks assessed/performed Area of Impairment: Memory;Safety/judgement;Problem solving Arousal/Alertness: Awake/alert Orientation Level: Appears intact for tasks assessed Behavior During Session: [facility] for tasks performed Safety/Judgement: Decreased awareness of safety precautions;Decreased safety judgement for tasks assessed;Impulsive    Balance  Static Sitting Balance Static Sitting - Balance Support: Feet supported Static Sitting - Level of Assistance: 5: Stand by assistance  End of Session PT - End of Session Equipment Utilized During Treatment: Gait belt Activity Tolerance: Patient limited by fatigue Patient left: in bed;with call bell/phone within reach Nurse Communication: Mobility status    Ferman Hamming 02/29/2012, 12:23 PM Acute Rehabilitation Services [phone] (pager)

## 2012-02-29 NOTE — Interval H&P Note (Signed)
History and Physical Interval Note:  02/29/2012 8:09 AM  Bryan Gilmore  has presented today for surgery, with the diagnosis of Perineal Wound  The various methods of treatment have been discussed with the patient and family. After consideration of risks, benefits and other options for treatment, the patient has consented to  Procedure(s) (LRB): IRRIGATION AND DEBRIDEMENT ABSCESS (N/A) as a surgical intervention .  The patient's history has been reviewed, patient examined, no change in status, stable for surgery.  I have reviewed the patients' chart and labs.  Questions were answered to the patient's satisfaction.     Dilpreet Faires A

## 2012-02-29 NOTE — Anesthesia Preprocedure Evaluation (Addendum)
Anesthesia Evaluation  Patient identified by MRN, date of birth, ID band Patient awake    Reviewed: Allergy & Precautions, NPO status , Patient's Chart, lab work & pertinent test results  History of Anesthesia Complications Negative for: history of anesthetic complications  Airway Mallampati: II TM Distance: >3 FB Neck ROM: Full    Dental  (+) Poor Dentition   Pulmonary neg pulmonary ROS,  + rhonchi         Cardiovascular hypertension, Pt. on medications Rhythm:Irregular Rate:Normal     Neuro/Psych PSYCHIATRIC DISORDERS    GI/Hepatic (+) Hepatitis -  Endo/Other    Renal/GU ESRF and DialysisRenal disease     Musculoskeletal   Abdominal   Peds  Hematology  (+) HIV,   Anesthesia Other Findings   Reproductive/Obstetrics                          Anesthesia Physical Anesthesia Plan  ASA: III  Anesthesia Plan: General   Post-op Pain Management:    Induction: Intravenous  Airway Management Planned: LMA and Oral ETT  Additional Equipment:   Intra-op Plan:   Post-operative Plan: Extubation in OR  Informed Consent: I have reviewed the patients History and Physical, chart, labs and discussed the procedure including the risks, benefits and alternatives for the proposed anesthesia with the patient or authorized representative who has indicated his/her understanding and acceptance.   Dental advisory given  Plan Discussed with: CRNA, Surgeon and Anesthesiologist  Anesthesia Plan Comments:        Anesthesia Quick Evaluation

## 2012-02-29 NOTE — Anesthesia Procedure Notes (Addendum)
Performed by: Rogelia Boga   Procedure Name: LMA Insertion Date/Time: 02/29/2012 1:31 PM Performed by: Rogelia Boga Pre-anesthesia Checklist: Patient identified, Emergency Drugs available, Suction available, Patient being monitored and Timeout performed Patient Re-evaluated:Patient Re-evaluated prior to inductionOxygen Delivery Method: Circle system utilized Preoxygenation: Pre-oxygenation with 100% oxygen Intubation Type: IV induction LMA: LMA inserted LMA Size: 4.0 Tube type: Oral Number of attempts: 1 Placement Confirmation: positive ETCO2 and breath sounds checked- equal and bilateral Tube secured with: Tape Dental Injury: Teeth and Oropharynx as per pre-operative assessment

## 2012-02-29 NOTE — Anesthesia Postprocedure Evaluation (Signed)
  Anesthesia Post-op Note  Patient: Bryan Gilmore  Procedure(s) Performed: Procedure(s) (LRB): IRRIGATION AND DEBRIDEMENT ABSCESS (N/A)  Patient Location: PACU  Anesthesia Type: General  Level of Consciousness: awake  Airway and Oxygen Therapy: Patient Spontanous Breathing  Post-op Pain: mild  Post-op Assessment: Post-op Vital signs reviewed  Post-op Vital Signs: stable  Complications: No apparent anesthesia complications

## 2012-02-29 NOTE — Progress Notes (Signed)
INITIAL ADULT NUTRITION ASSESSMENT Date: 02/29/2012   Time: 8:12 AM  Reason for Assessment: MD Consult   ASSESSMENT: Male 61 y.o.  Dx: Sepsis  Hx:  Past Medical History  Diagnosis Date  . Hypertension   . Renal disorder   . HIV (human immunodeficiency virus infection)    Past Surgical History  Procedure Date  . Cystoscopy with urethral dilatation 02/17/2012    Procedure: CYSTOSCOPY WITH URETHRAL DILATATION;  Surgeon: Martina Sinner, MD;  Location: [facility];  Service: Urology;  Laterality: N/A;  retrograde urethragram, placement of foley catheter exam under anesthesia   . Insertion of dialysis catheter 02/20/2012    Procedure: INSERTION OF DIALYSIS CATHETER;  Surgeon: Chuck Hint, MD;  Location: [facility] OR;  Service: Vascular;  Laterality: N/A;   Related Meds:     . antiseptic oral rinse  15 mL Mouth Rinse BID  . calcium acetate  1,334 mg Oral TID WC  . darbepoetin (ARANESP) injection - DIALYSIS  100 mcg Intravenous Q Wed-HD  . lamiVUDine  50 mg Oral Daily  . lopinavir-ritonavir  5 mL Oral BID WC  . pantoprazole sodium  40 mg Oral Q1200  . paricalcitol  5 mcg Intravenous 3 times weekly  . piperacillin-tazobactam (ZOSYN)  IV  2.25 g Intravenous Q8H  . sodium chloride irrigation  1,000 mL Irrigation BID  . Zidovudine  300 mg Oral Daily   Ht: 5\' 11"  (180.3 cm)  Wt: 233 lb (106.1 kg) s/p HD on 6/12  Ideal Wt: 78.2 kg % Ideal Wt: 138%  Wt Readings from Last 15 Encounters:  02/28/12 238 lb 4.8 oz (108.092 kg)  02/28/12 238 lb 4.8 oz (108.092 kg)  02/28/12 238 lb 4.8 oz (108.092 kg)  02/28/12 238 lb 4.8 oz (108.092 kg)  02/28/12 238 lb 4.8 oz (108.092 kg)  02/28/12 238 lb 4.8 oz (108.092 kg)  05/02/11 242 lb (109.77 kg)  Usual Wt: 240 lb per pt % Usual Wt: 97%  BMI is 32.6 (using dry wt on 6/12) Obese Class II  Food/Nutrition Related Hx: Regular diet PTA; denies wt loss or appetite changes PTA  Labs:  CMP     Component Value Date/Time   NA 132* 02/29/2012  0535   K 2.8* 02/29/2012 0535   CL 94* 02/29/2012 0535   CO2 25 02/29/2012 0535   GLUCOSE 130* 02/29/2012 0535   BUN 22 02/29/2012 0535   CREATININE 4.79* 02/29/2012 0535   CREATININE 4.51* 04/17/2011 1521   CALCIUM 8.8 02/29/2012 0535   PROT 6.3 02/17/2012 1830   ALBUMIN 1.4* 02/28/2012 1103   AST 23 02/17/2012 1830   ALT 10 02/17/2012 1830   ALKPHOS 197* 02/17/2012 1830   BILITOT 0.8 02/17/2012 1830   GFRNONAA 12* 02/29/2012 0535   GFRAA 14* 02/29/2012 0535   Phosphorus  Date/Time Value Range Status  02/28/2012 11:03 AM 6.6* 2.3 - 4.6 mg/dL Final   Magnesium  Date/Time Value Range Status  02/20/2012  5:00 AM 2.4  1.5 - 2.5 mg/dL Final   CBG (last 3)   Basename 02/29/12 0747 02/28/12 2246 02/28/12 1713  GLUCAP 121* 130* 137*   Lipid Panel     Component Value Date/Time   CHOL 163 09/20/2010 2008   TRIG 121 09/20/2010 2008   HDL 46 09/20/2010 2008   CHOLHDL 3.5 Ratio 09/20/2010 2008   VLDL 24 09/20/2010 2008   LDLCALC 93 09/20/2010 2008     Intake/Output Summary (Last 24 hours) at 02/29/12 0817 Last data filed at 02/28/12 2300  Gross per 24 hour  Intake    360 ml  Output   3156 ml  Net  -2796 ml  BM on 6/19 - darrhea  Diet Order: NPO  Supplements/Tube Feeding: none  IVF:    sodium chloride Last Rate: 20 mL/hr (02/27/12 0021)   Estimated Nutritional Needs:   Kcal: 2500 - 2800 kcal Protein: 130 - 150 grams protein Fluid: 1.2 liters daily  PMHx of HIV and Stage III CKD. Admitted with groin pain and complaints of dysuria with difficulty urinating 1 week PTA. Work-up reveals sepsis 2/2 suspected Fornier's gangrene.  Cystoscopy; retrograde urethrogram; balloon dilation of stricture; insertion of Foley catheter; incision and drainage of Fournier's gangrene and perineal abscess - completed 6/8.  Nephrology consulted 2/2 progressive CKD. Pt transferred to 02/20/2099 for CRRT on 6/8 - 6/11. Transferred to step-down, received HD on 6/12. Transferred back to 2099/02/20 for perineal blood loss. HD again  on 6/14. Transferred to 6700 on 6/14. Pt progressed to ESRD, HD MWF. Pt has been set-up with outpt facility, [facility] HD center.  Temp catheter placed, currently waiting for permanent AVG fistula when medically appropriate prior to d/c.  Pt currently NPO, previously ordered for Renal 60 - 70 diet. Discussed intake with RN, per RN pt not eating well while on diet. Pt appears with thin extremities. Based on current estimated needs, pt with intake of <75% of estimated energy needs for at least 7 days. Difficult to assess wt loss given fluid fluctuations, however suspect at least a 3% wt loss since admission, given usual weight is 240 lb and most recent dry weight is 233 lb (3% wt loss x 12 days.) Pt also reports that he feels very weak 2/2 prolonged hospitalization. Pt meets criteria for moderate malnutrition in the context of acute illness.  NUTRITION DIAGNOSIS: -Inadequate oral intake (NI-2.1).  Status: Ongoing  RELATED TO: variable appetite and prolonged hospitalization > 7 days with intermittent NPO status  AS EVIDENCED BY: 3% wt loss since admission and poor PO intake of meals  MONITORING/EVALUATION(Goals): Goal: Pt to consume at least 75% of estimated nutrition needs Monitor: weights, labs, PO intake, need for education, I/O's  EDUCATION NEEDS: -Education not appropriate at this time  INTERVENTION: 1. 30 ml Prostat PO TID and Resource Breeze PO BID for additional protein and kcal 2. Recommend Rena-vite 3. Recommend Renal 80 - 90 diet to better meet protein needs; consider diet liberalization if PO intake remains poor 4. RD to continue to follow nutrition care plan  Dietitian #: [phone] 02/20/2645  DOCUMENTATION CODES Per approved criteria  -Non-severe (moderate) malnutrition in the context of acute illness or injury -Obesity Unspecified    Adair Laundry Pager: [phone]

## 2012-02-29 NOTE — Consult Note (Signed)
ANTIBIOTIC CONSULT NOTE - FOLLOW UP  Pharmacy Consult for Zosyn Indication: Fournier's gangrene  No Known Allergies  Patient Measurements: Height: 5\' 11"  (180.3 cm) Weight: 238 lb 4.8 oz (108.092 kg) (bed scale) IBW/kg (Calculated) : 75.3    Vital Signs: Temp Readings from Last 3 Encounters:  02/29/12 99.3 F (37.4 C)   02/29/12 99.3 F (37.4 C)   02/29/12 99.3 F (37.4 C)     Intake/Output from previous day: 06/19 0701 - 06/20 0700 In: 600 [P.O.:600] Out: 3706 [Urine:900]  Labs:  Basename 02/29/12 0535 02/28/12 1103  WBC 10.6* 9.6  HGB 7.9* 7.6*  PLT 271 260  LABCREA -- --  CREATININE 4.79* 7.10*   Micro: Blood 6/8>> NGTD Abscess 6/8>> negative Urine 6/8>> negative MRSA pcr 6/8 Negative   Assessment: 61 yo M continues on Zosyn d#12/14 for 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  Antibiotics has stop date in place.  Plan repeat I&D today per urology.  Goal of Therapy:  Renal dose adjustement  Plan:  Continue Zosyn 2.25 gm IV q 8 hours.  [employer], Pharm.D., BCPS Clinical Pharmacist Pager [phone] 02/29/2012 9:34 AM

## 2012-02-29 NOTE — Preoperative (Signed)
Beta Blockers   Reason not to administer Beta Blockers:Not Applicable 

## 2012-02-29 NOTE — Progress Notes (Signed)
Patient ID: Bryan Gilmore, male   DOB: 02-Mar-1951, 61 y.o.   MRN: [id number]  [facility] PROGRESS NOTE  Bryan Gilmore WUJ:[id number] DOB: [birth year] DOA: 02/17/2012 PCP: Sheila Oats, MD  Brief narrative: 61 year old male with HIV/AIDS (CD4 180, VL ~190,000), now ESRD on HD who was admitted 02/17/2012 for testicular discomfort and swelling and 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 Patient was found to have evolving CKD and was subsequently transferred to [facility] under PCCM. [name] now requires HD and plan is for AVG once I&D completed.  Consultants:  Urology  Vascular  PCCM  Infectious disease  Nephrology  Procedures:  I & D 02/29/2012   Antibiotics:  Zosyn 02/20/2012 -->  HPI/Subjective: No events overnight. Pt denies chest pain or shortness of breath.   Objective: Filed Vitals:   02/29/12 1448 02/29/12 1449 02/29/12 1500 02/29/12 1515  BP:  140/83    Pulse:  86 81 88  Temp: 98.6 F (37 C)     TempSrc:      Resp:  21 20 23   Height:      Weight:      SpO2:  100% 99% 99%    Intake/Output Summary (Last 24 hours) at 02/29/12 1519 Last data filed at 02/29/12 1445  Gross per 24 hour  Intake    480 ml  Output    350 ml  Net    130 ml    Exam:   General:  Pt is alert, follows commands appropriately, not in acute distress  Cardiovascular: Regular rate and rhythm, S1/S2, no murmurs, no rubs, no gallops  Respiratory: Clear to auscultation bilaterally, no wheezing, no crackles, no rhonchi  Abdomen: Soft, non tender, non distended, bowel sounds present, no guarding  Extremities: No edema, pulses DP and PT palpable bilaterally  Neuro: Grossly nonfocal  Data Reviewed: Basic Metabolic Panel:  Lab 02/29/12 9562 02/28/12 1103 02/26/12 0751 02/23/12 0802  NA 132* 131* 133* 136  K 2.8* 3.8 4.1 3.7  CL 94* 91* 92* 98  CO2 25 24 23 26   GLUCOSE 130* 108* 112* 96  BUN 22 33* 39* 27*  CREATININE 4.79* 7.10*  7.62* 5.13*  CALCIUM 8.8 8.8 8.6 8.3*  MG -- -- -- --  PHOS -- 6.6* 7.1* 5.0*   Liver Function Tests:  Lab 02/28/12 1103 02/26/12 0751 02/23/12 0802  AST -- -- --  ALT -- -- --  ALKPHOS -- -- --  BILITOT -- -- --  PROT -- -- --  ALBUMIN 1.4* 1.5* 1.4*   CBC:  Lab 02/29/12 0535 02/28/12 1103 02/26/12 0750 02/25/12 0537 02/23/12 0333  WBC 10.6* 9.6 12.6* 13.4* 11.9*  NEUTROABS -- -- -- 10.9* --  HGB 7.9* 7.6* 8.2* 8.1* 8.0*  HCT 23.5* 22.6* 24.2* 23.9* 22.3*  MCV 86.7 86.3 85.5 85.4 81.7  PLT 271 260 206 167 110*   CBG:  Lab 02/29/12 0747 02/28/12 2246 02/28/12 1713 02/28/12 0741 02/27/12 2007  GLUCAP 121* 130* 137* 91 107*   Studies: No results found.  Scheduled Meds:   . antiseptic oral rinse  15 mL Mouth Rinse BID  . calcium acetate  1,334 mg Oral TID WC  . darbepoetin (ARANESP) injection - DIALYSIS  100 mcg Intravenous Q Wed-HD  . feeding supplement  30 mL Oral TID WC  . feeding supplement  1 Container Oral BID BM  . lamiVUDine  50 mg Oral Daily  . lopinavir-ritonavir  5 mL Oral BID WC  . multivitamin  1 tablet Oral QHS  . pantoprazole sodium  40 mg Oral Q1200  . paricalcitol  5 mcg Intravenous 3 times weekly  . piperacillin-tazobactam (ZOSYN)  IV  2.25 g Intravenous Q8H  . sodium chloride irrigation  1,000 mL Irrigation BID  . Zidovudine  300 mg Oral Daily   Continuous Infusions:   . sodium chloride 20 mL/hr (02/27/12 0021)     Assessment/Plan:  Principal Problem:  *Fournier gangrene/perirectal abscess  - Status post debridement by Dr. Sherron Monday on 02/17/2012.  - Plan for I&D again today - Cultures are still negative to date including deep tissue cultures and blood cultures.  - Patient was on Zosyn, vancomycin and clindamycin but at this time only on zosyn   Active Problems:  Sepsis secondary to fournier gangrene and perirectal abscess  - resolved, patient is hemodynamically stable and does not require pressors  - currently only on zosyn  -  appreciate ID following   End-stage renal disease on HD, metabolic acidosis  - HD per renal schedule  - Plan for AVG once I&D completed; appreciate vascular surgery following  - Patient has a right IJ 23 cm Diatek catheter as access for dialysis.  - He was being set up to receive dialysis at [facility] dialysis unit on T/T/S second shift   HIV/AIDS, CD4 count <200 (CD4 180, VL 190,000)  - currently on retrovir, lamivudine and kaletra  - appreciate ID following  - ? Bactrim for PCP prophylaxis   Hepatitis C  -Normal AST/ALT. And normal INR.   Acute respiratory failure  - resolved, respiratory status stable at this time  - This is accompanied the severe metabolic acidosis as ventilatory failure.  - patient did not require intubation or mechanical ventilation.   Hemoptysis  - Reported that he coughed up large blood clot after surgery.  - secondary to minor trauma from intubation during anesthesia. Or it is secondary to platelets dysfunction from uremia. It might be both.  - No reported hemoptysis recently.   Altered mental status  -Secondary to SIRS and uremia, this is improved with antibiotics, wound debridement and hemodialysis.   Anemia of chronic kidney disease  - aranesp with HD   Hyperphosphatemia  - continue zemplar   Severe protein calorie malnutrition  - nutrition consult    Hypokalemia - potassium with HD in AM  Code Status: Full Family Communication: Pt at bedside Disposition Plan: For I & D today  Debbora Presto, MD  [facility] Pager [phone]  If 7PM-7AM, please contact night-coverage [email or web address] Password [email or web address] 02/29/2012, 3:19 PM   LOS: 12 days

## 2012-02-29 NOTE — Progress Notes (Signed)
Pt was noted with a large amount of blood from his incision and the MD was paged. The NP on call stated to place a pressure dressing with some ice over incision site and if he is still bleeding to call the office back. Dressing was placed along with the ice and the oncoming nurse was made aware to call the doctor if it starts back bleeding. Will cont to monitor.

## 2012-02-29 NOTE — Progress Notes (Signed)
Subjective: Interval History: has complaints pain post op.  Objective: Vital signs in last 24 hours: Temp:  [97.9 F (36.6 C)-99.3 F (37.4 C)] 97.9 F (36.6 C) (06/20 1515) Pulse Rate:  [81-104] 85  (06/20 1549) Resp:  [18-23] 22  (06/20 1549) BP: (140-163)/(73-108) 163/108 mmHg (06/20 1549) SpO2:  [98 %-100 %] 99 % (06/20 1515) FiO2 (%):  [100 %] 100 % (06/20 1445) Weight:  [108.092 kg (238 lb 4.8 oz)] 108.092 kg (238 lb 4.8 oz) (06/19 2300) Weight change: 0 kg (0 lb)  Intake/Output from previous day: 06/19 0701 - 06/20 0700 In: 600 [P.O.:600] Out: 3706 [Urine:900] Intake/Output this shift: Total I/O In: 360 [P.O.:60; I.V.:300] Out: -   General appearance: sleepy but oriented and cooperative Resp: diminished breath sounds bilaterally Chest wall: RIJ cath, dressing off Cardio: S1, S2 normal, S4 present and systolic murmur: systolic ejection 2/6, decrescendo at 2nd left intercostal space GI: pos bs, liver down 5 cm Extremities: extremities normal, atraumatic, no cyanosis or edema  Lab Results:  Basename 02/29/12 0535 02/28/12 1103  WBC 10.6* 9.6  HGB 7.9* 7.6*  HCT 23.5* 22.6*  PLT 271 260   BMET:  Basename 02/29/12 0535 02/28/12 1103  NA 132* 131*  K 2.8* 3.8  CL 94* 91*  CO2 25 24  GLUCOSE 130* 108*  BUN 22 33*  CREATININE 4.79* 7.10*  CALCIUM 8.8 8.8   No results found for this basename: PTH:2 in the last 72 hours Iron Studies: No results found for this basename: IRON,TIBC,TRANSFERRIN,FERRITIN in the last 72 hours  Studies/Results: No results found.  I have reviewed the patient's current medications.  Assessment/Plan: 1 ESRD vol xs mild, low K. 2 Anemia epo/fe 3 HIV meds 4 Hep C 5 Forniers gangrene per Urology 6 HPTH  P HD, epo, vit D, AB   LOS: 12 days   Didi Ganaway L 02/29/2012,4:56 PM

## 2012-02-29 NOTE — Transfer of Care (Signed)
Immediate Anesthesia Transfer of Care Note  Patient: Bryan Gilmore  Procedure(s) Performed: Procedure(s) (LRB): IRRIGATION AND DEBRIDEMENT ABSCESS (N/A)  Patient Location: PACU  Anesthesia Type: General  Level of Consciousness: awake, alert , oriented and patient cooperative  Airway & Oxygen Therapy: Patient Spontanous Breathing and Patient connected to nasal cannula oxygen  Post-op Assessment: Report given to PACU RN, Post -op Vital signs reviewed and stable and Patient moving all extremities X 4  Post vital signs: Reviewed and stable  Complications: No apparent anesthesia complications

## 2012-02-29 NOTE — Op Note (Signed)
Operative diagnosis: Fournier's gangrene Postoperative diagnosis: Fournier's gangrene and loss of bulbar urethra Surgery: Wound debridement and irrigation and partial closure of wound Surgeon: Dr. Lorin Picket Patience Nuzzo  Mr. Baldinger has Fournier's gangrene. His incision was granulating and recently well but I thought there was a bit of necrotic tissue that would benefit from removal  The patient was prepared for surgery by the medical team. He continues on dialysis. He is HIV positive  Patient was prepped and draped in usual fashion. He had superficial necrotic tissue in the middle aspect of the wound and superiorly on the right side. It was easily removed with cautery and sharp scissors. He little bit of superficial necrosis of the bulbospongiosus muscle distal and in the midline. The urethra was intact. After the superficial debridement he had excellent granulation tissue that was red and well vascularized. I trimmed it a narrow strip of dead skin on the right margin for approximately 8 cm in length by 1 cm.  After irrigating I inspected up near the prostate in the cavity that was present at the time of the Fournier's gangrene just anterior to the rectum. I could feel the Foley catheter and see at approximately 2-3 cm in length. It would be the bulbar urethra after the test turned of the bulb  Pulsavac with saline was utilized copiously.  I decided to reapproximate the skin over the testicles with 3 interrupted superficial 3 0-0 Vicryl suture. Wet-to-dry dressing was applied. Patient was taken to recovery room  In my opinion Mr. Odenthal will need a Foley catheter for approxi-4 months and an examination under anesthesia. He may need a suprapubic tube her urinary diversion. He is at high risk of having an ablative stricture upon removal of the Foley cath

## 2012-03-01 ENCOUNTER — Encounter (HOSPITAL_COMMUNITY): Payer: Self-pay | Admitting: Urology

## 2012-03-01 DIAGNOSIS — A413 Sepsis due to Hemophilus influenzae: Secondary | ICD-10-CM

## 2012-03-01 DIAGNOSIS — L03818 Cellulitis of other sites: Secondary | ICD-10-CM

## 2012-03-01 DIAGNOSIS — L02818 Cutaneous abscess of other sites: Secondary | ICD-10-CM

## 2012-03-01 DIAGNOSIS — N186 End stage renal disease: Secondary | ICD-10-CM

## 2012-03-01 DIAGNOSIS — B20 Human immunodeficiency virus [HIV] disease: Secondary | ICD-10-CM

## 2012-03-01 LAB — PREPARE RBC (CROSSMATCH)

## 2012-03-01 LAB — GLUCOSE, CAPILLARY
Glucose-Capillary: 96 mg/dL (ref 70–99)
Glucose-Capillary: 119 mg/dL — ABNORMAL HIGH (ref 70–99)

## 2012-03-01 LAB — COMPREHENSIVE METABOLIC PANEL
ALT: 10 U/L (ref 0–53)
AST: 21 U/L (ref 0–37)
Albumin: 1.3 g/dL — ABNORMAL LOW (ref 3.5–5.2)
Alkaline Phosphatase: 96 U/L (ref 39–117)
Potassium: 2.5 mEq/L — CL (ref 3.5–5.1)
Sodium: 134 mEq/L — ABNORMAL LOW (ref 135–145)
Total Protein: 6.6 g/dL (ref 6.0–8.3)

## 2012-03-01 LAB — CBC
Platelets: 307 10*3/uL (ref 150–400)
RBC: 2.42 MIL/uL — ABNORMAL LOW (ref 4.22–5.81)
RDW: 18.2 % — ABNORMAL HIGH (ref 11.5–15.5)
WBC: 8.1 10*3/uL (ref 4.0–10.5)

## 2012-03-01 MED ORDER — ZIDOVUDINE 50 MG/5ML PO SYRP: 300.0000 mg | ORAL_SOLUTION | Freq: Every day | ORAL | Status: DC

## 2012-03-01 MED ORDER — PENTAFLUOROPROP-TETRAFLUOROETH EX AERO: 1.0000 "application " | INHALATION_SPRAY | CUTANEOUS | Status: DC | PRN

## 2012-03-01 MED ORDER — LIDOCAINE HCL (PF) 1 % IJ SOLN: 5.0000 mL | INTRAMUSCULAR | Status: DC | PRN

## 2012-03-01 MED ORDER — LOPINAVIR-RITONAVIR 400-100 MG/5ML PO SOLN: 400.0000 mg | Freq: Two times a day (BID) | ORAL | Status: DC

## 2012-03-01 MED ORDER — PANTOPRAZOLE SODIUM 40 MG PO PACK: 40.0000 mg | PACK | Freq: Every day | ORAL | Status: DC

## 2012-03-01 MED ORDER — POTASSIUM CHLORIDE CRYS ER 20 MEQ PO TBCR
40.0000 meq | EXTENDED_RELEASE_TABLET | ORAL | Status: AC
  Administered 2012-03-01: 40 meq via ORAL
  Filled 2012-03-01: qty 2

## 2012-03-01 MED ORDER — CALCIUM ACETATE 667 MG PO CAPS: 1334.0000 mg | ORAL_CAPSULE | Freq: Three times a day (TID) | ORAL | Status: DC

## 2012-03-01 MED ORDER — HYDRALAZINE HCL 20 MG/ML IJ SOLN: 5.0000 mg | Freq: Four times a day (QID) | INTRAMUSCULAR | Status: DC | PRN

## 2012-03-01 MED ORDER — RENA-VITE PO TABS: 1.0000 | ORAL_TABLET | Freq: Every day | ORAL | Status: DC

## 2012-03-01 MED ORDER — PROMETHAZINE HCL 25 MG/ML IJ SOLN: 6.2500 mg | INTRAMUSCULAR | Status: DC | PRN

## 2012-03-01 MED ORDER — ONDANSETRON HCL 4 MG/2ML IJ SOLN: 4.0000 mg | INTRAMUSCULAR | Status: DC | PRN

## 2012-03-01 MED ORDER — POTASSIUM CHLORIDE CRYS ER 20 MEQ PO TBCR
40.0000 meq | EXTENDED_RELEASE_TABLET | Freq: Two times a day (BID) | ORAL | Status: DC
  Administered 2012-03-01: 40 meq via ORAL
  Filled 2012-03-01: qty 2

## 2012-03-01 MED ORDER — PRO-STAT SUGAR FREE PO LIQD: 30.0000 mL | Freq: Three times a day (TID) | ORAL | Status: DC

## 2012-03-01 MED ORDER — POTASSIUM CHLORIDE CRYS ER 20 MEQ PO TBCR: 40.0000 meq | EXTENDED_RELEASE_TABLET | Freq: Two times a day (BID) | ORAL | Status: DC

## 2012-03-01 MED ORDER — NEPRO/CARBSTEADY PO LIQD: 237.0000 mL | ORAL | Status: DC | PRN

## 2012-03-01 MED ORDER — BOOST / RESOURCE BREEZE PO LIQD: 1.0000 | Freq: Two times a day (BID) | ORAL | Status: DC

## 2012-03-01 MED ORDER — MORPHINE SULFATE 2 MG/ML IJ SOLN: 2.0000 mg | INTRAMUSCULAR | Status: DC | PRN

## 2012-03-01 MED ORDER — PIPERACILLIN-TAZOBACTAM IN DEX 2-0.25 GM/50ML IV SOLN: 2.2500 g | Freq: Three times a day (TID) | INTRAVENOUS | Status: DC

## 2012-03-01 MED ORDER — POTASSIUM CHLORIDE 10 MEQ/100ML IV SOLN
10.0000 meq | INTRAVENOUS | Status: AC
  Administered 2012-03-01 (×4): 10 meq via INTRAVENOUS
  Filled 2012-03-01 (×4): qty 100

## 2012-03-01 MED ORDER — LIDOCAINE-PRILOCAINE 2.5-2.5 % EX CREA: 1.0000 "application " | TOPICAL_CREAM | CUTANEOUS | Status: DC | PRN

## 2012-03-01 MED ORDER — LAMIVUDINE 10 MG/ML PO SOLN: 50.0000 mg | Freq: Every day | ORAL | Status: DC

## 2012-03-01 MED ORDER — BIOTENE DRY MOUTH MT LIQD: 15.0000 mL | Freq: Two times a day (BID) | OROMUCOSAL | Status: DC

## 2012-03-01 MED ORDER — PARICALCITOL 5 MCG/ML IV SOLN: 5.0000 ug | INTRAVENOUS | Status: DC

## 2012-03-01 MED FILL — Bupivacaine Inj 0.25% w/ Epinephrine 1:200000 (PF): INTRAMUSCULAR | Qty: 30 | Status: AC

## 2012-03-01 NOTE — Progress Notes (Signed)
   CARE MANAGEMENT NOTE 03/01/2012  Patient:  Bryan Gilmore, Bryan Gilmore   Account Number:  [id number]  Date Initiated:  02/19/2012  Documentation initiated by:  [facility]  Subjective/Objective Assessment:   Fourniers gangrene and perirectal abscess. Renal failure - requiring dialysis.     Action/Plan:   Anticipated DC Date:  02/26/2012   Anticipated DC Plan:  HOME W HOME HEALTH SERVICES  In-house referral  Clinical Social Worker      DC Planning Services  CM consult      Choice offered to / List presented to:             Status of service:  In process, will continue to follow Medicare Important Message given?   (If response is "NO", the following Medicare IM given date fields will be blank) Date Medicare IM given:   Date Additional Medicare IM given:    Discharge Disposition:    Per UR Regulation:  Reviewed for med. necessity/level of care/duration of stay  If discussed at Long Length of Stay Meetings, dates discussed:    Comments:  03/01/2012  [address] RN, [place] [phone] [facility] called with admission information: Room:  319 Dr. Nelson Chimes Call report to: [phone] Fax dc summary to:  [phone] information given to charge nurse.   03/01/2012 [address] RN, [place] [phone] Advised by Driscilla Grammes [facility], patient approved by [employer].  02/27/2012 1500 Darlyne Russian RN, CCM  [phone] CM and SW spoke with patient regarding discharge planning for continued medical care. He lives alone and his nephew [name] can help as needed. Discussed the option of LTAC or SNF and he will have a copay. He will review the listing for the facilities and his nephew might be able to visit them. CM to continue to follow for discharge planning needs.  PCP - does not have one currently - in past has seen [facility].  [address] [name]  home [phone] or cell [phone]  02-22-12 1:30pm [name], RNBSN - [phone] Talked more with Mr. Stimpson about  discharge plan.  Does feel he will need assistance at home - new dialysis and probably wound care on discharge.  Discussed options - would be interested in [facility] - insurance only in network at [facility]. Discussed with physician - order recieved.  Referred  to [facility].

## 2012-03-01 NOTE — Progress Notes (Signed)
Physical Therapy Treatment Patient Details Name: Bryan Gilmore MRN: [id number] DOB: [birth year] Today's Date: 03/01/2012 Time: 6213-0865 PT Time Calculation (min): 15 min  PT Assessment / Plan / Recommendation Comments on Treatment Session  Pt's LEs are profoundly weak and he is no longer able to stand up or ambulate.  Was ambulatory one week ago.  Left note on sticky note for MD.Will make change in DC plan to SNF for rehab post acute.    Follow Up Recommendations  Skilled nursing facility;Supervision/Assistance - 24 hour;Supervision for mobility/OOB    Barriers to Discharge        Equipment Recommendations  None recommended by PT    Recommendations for Other Services    Frequency Min 3X/week   Plan Discharge plan needs to be updated    Precautions / Restrictions Precautions Precautions: Fall Restrictions Weight Bearing Restrictions: No   Pertinent Vitals/Pain No c/o pain, no distress    Mobility  Bed Mobility Bed Mobility: Supine to Sit;Sitting - Scoot to Edge of Bed Supine to Sit: 3: Mod assist Sitting - Scoot to Edge of Bed: 3: Mod assist Sit to Supine: 3: Mod assist Details for Bed Mobility Assistance: needs cueing for safety and technique Transfers Transfers: Sit to Stand;Stand to Sit Sit to Stand: 1: +2 Total assist;From bed;With upper extremity assist;From elevated surface Sit to Stand: Patient Percentage: 40% Stand to Sit: 1: +2 Total assist;With upper extremity assist;To bed;To elevated surface Stand to Sit: Patient Percentage: 40% Details for Transfer Assistance: On two attempts, pt not able to stand or partially stand, even with height of bed raised. Ambulation/Gait Ambulation/Gait Assistance: Not tested (comment);Other (comment) (pt. unable to ambulate) Assistive device: Rolling walker    Exercises     PT Diagnosis:    PT Problem List:   PT Treatment Interventions:     PT Goals Acute Rehab PT Goals PT Goal: Supine/Side to Sit - Progress: Not  progressing PT Goal: Sit to Supine/Side - Progress: Not progressing PT Goal: Sit to Stand - Progress: Not progressing PT Goal: Stand to Sit - Progress: Not progressing  Visit Information  Last PT Received On: 03/01/12 Assistance Needed: +2    Subjective Data  Subjective: I've got to get these legs stronger   Cognition  Overall Cognitive Status: Appears within functional limits for tasks assessed/performed Area of Impairment: Memory;Safety/judgement;Problem solving Arousal/Alertness: Awake/alert Orientation Level: Appears intact for tasks assessed Behavior During Session: [facility] for tasks performed Safety/Judgement: Decreased awareness of safety precautions;Decreased safety judgement for tasks assessed;Impulsive    Balance     End of Session PT - End of Session Equipment Utilized During Treatment: Gait belt Activity Tolerance: Patient limited by fatigue;Other (comment) (limited by increased weakness) Patient left: in bed;with call bell/phone within reach Nurse Communication: Mobility status    Ferman Hamming 03/01/2012, 11:51 AM Acute Rehabilitation Services [phone] (pager)

## 2012-03-01 NOTE — Discharge Summary (Signed)
Physician Discharge Summary  Bryan Gilmore:[id number] DOB: 1950/10/19 DOA: 02/17/2012  PCP: Sheila Oats, MD  Admit date: 02/17/2012 Discharge date: 03/01/2012  Recommendations for Outpatient Follow-up:  1. Pt will be discharged to [facility]. Discussed with Dr. Edilia Bo about outpatient AVG placement. Please also note K is low today and I have ordered one dose of KDUR 40 MEQ to be given today before discharge. Please check BMP to reassess.   Discharge Diagnoses:  Sepsis secondary to suspected Fornier's gangrene Principal Problem:  *Sepsis secondary to suspected Fornier's gangrene Active Problems:  HIV INFECTION  Normocytic anemia  Acute respiratory failure  ESRD (end stage renal disease)  Anemia of chronic renal failure  Discharge Condition: Stable for discharge  Diet recommendation: Supplementation as noted under medication section and as tolerated, renal diet  History of present illness:  61 year old male with HIV/AIDS (CD4 180, VL ~190,000), now ESRD on HD who was admitted 02/17/2012 for testicular discomfort and swelling and 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 Patient was found to have evolving CKD and was subsequently transferred to [facility] under PCCM. [name] now requires HD MW. Per renal recommendations AVG may be only access option and can be done in an outpatient setting as was discussed with Dr. Edilia Bo.  Hospital Course:   Principal Problem:  *Fournier gangrene/perirectal abscess  - Status post debridement by Dr. Sherron Monday on 02/17/2012 and most recently 02/29/2012  - Cultures are still negative to date including deep tissue cultures and blood cultures.  - Patient was on Zosyn, vancomycin and clindamycin but at this time only on zosyn  - per urology pt will need foley for 4 months and is at high risk of developing ablative stricture upon foley cath removal   Active Problems:  Sepsis secondary to fournier gangrene and  perirectal abscess  - resolved, patient is hemodynamically stable and does not require pressors  - currently only on zosyn  - appreciate ID following   End-stage renal disease on HD, metabolic acidosis  - HD per renal schedule, next HD tomorrow 03/02/2012  - Plan for AVG once clear from infection; appreciate vascular surgery following  - Patient has a right IJ 23 cm Diatek catheter as access for dialysis.  - He was being set up to receive dialysis at [facility] dialysis unit on MWF second shift   HIV/AIDS, CD4 count <200 (CD4 180, VL 190,000)  - currently on retrovir, lamivudine and kaletra  - appreciate ID following   Hepatitis C  -Normal AST/ALT. And normal INR.   Acute respiratory failure  - resolved, respiratory status stable at this time  - This is accompanied the severe metabolic acidosis as ventilatory failure.  - patient did not require intubation or mechanical ventilation.   Hemoptysis  - Reported that he coughed up large blood clot after surgery.  - secondary to minor trauma from intubation during anesthesia or secondary to platelets dysfunction from uremia. It might be both.  - No reported hemoptysis recently.   Altered mental status  -Secondary to SIRS and uremia, this is improved with antibiotics, wound debridement and hemodialysis.   Anemia of chronic kidney disease  - aranesp with HD  - per renal plan for transfusion with HD Saturday 6/22   Hyperphosphatemia  - continue zemplar   Severe protein calorie malnutrition  - nutrition consulted   Hypokalemia  - potassium with HD in AM   Consultants:  Urology  Vascular  PCCM  Infectious disease  Nephrology  Procedures:  I & D 02/29/2012   Antibiotics:  Zosyn 02/20/2012 -->  Discharge Exam: Filed Vitals:   03/01/12 1300  BP: 121/56  Pulse: 78  Temp: 98.3 F (36.8 C)  Resp: 16   Filed Vitals:   02/29/12 2147 03/01/12 0531 03/01/12 0905 03/01/12 1300  BP: 140/77 136/78 128/65 121/56  Pulse:  83 90 91 78  Temp: 98 F (36.7 C) 98.7 F (37.1 C) 98.2 F (36.8 C) 98.3 F (36.8 C)  TempSrc: Oral Oral Oral Oral  Resp: 22 22 18 16   Height:      Weight:      SpO2: 100% 100% 100% 99%    General: Pt is alert, follows commands appropriately, not in acute distress, sick appearing Cardiovascular: Regular rate and rhythm, S1/S2 +, no murmurs, no rubs, no gallops Respiratory: Clear to auscultation bilaterally but decreased at bases, no wheezing, no crackles, no rhonchi Abdominal: Soft, non tender, non distended, bowel sounds +, no guarding Extremities: no edema, no cyanosis, pulses palpable bilaterally DP and PT Neuro: Grossly nonfocal  Discharge Instructions  Discharge Orders    Future Appointments: Provider: Department: Dept Phone: Center:   03/12/2012 2:45 PM Gardiner Barefoot, MD [facility] [id number] [facility]     Future Orders Please Complete By Expires   Diet - low sodium heart healthy      Increase activity slowly        Medication List  As of 03/01/2012  4:03 PM   STOP taking these medications         acyclovir 200 MG capsule      ibuprofen 800 MG tablet      sulfamethoxazole-trimethoprim 800-160 MG per tablet         TAKE these medications         allopurinol 100 MG tablet   Commonly known as: ZYLOPRIM   Take 300 mg by mouth daily.      antiseptic oral rinse Liqd   15 mLs by Mouth Rinse route 2 (two) times daily.      calcium acetate 667 MG capsule   Commonly known as: PHOSLO   Take 2 capsules (1,334 mg total) by mouth 3 (three) times daily with meals.      emtricitabine-tenofovir 200-300 MG per tablet   Commonly known as: TRUVADA   Take 1 tablet by mouth every Monday, Wednesday, and Friday.      feeding supplement (NEPRO CARB STEADY) Liqd   Take 237 mLs by mouth as needed (missed meal during dialysis.).      feeding supplement Liqd   Take 1 Container by mouth 2 (two) times daily between meals.      feeding supplement Liqd   Take 30 mLs by  mouth 3 (three) times daily with meals.      hydrALAZINE 20 MG/ML injection   Commonly known as: APRESOLINE   Inject 0.25 mLs (5 mg total) into the vein every 6 (six) hours as needed (For SBP > 160).      lamiVUDine 10 MG/ML solution   Commonly known as: EPIVIR   Take 5 mLs (50 mg total) by mouth daily.      lidocaine 1 % Soln injection   Commonly known as: XYLOCAINE   Inject 5 mLs into the skin as needed (topical anesthesia for hemodialysis ifGEBAUERS is ineffective.).      lidocaine-prilocaine cream   Commonly known as: EMLA   Apply 1 application topically as needed (topical anesthesia for hemodialysis if Gebauers and Lidocaine  injection are ineffective.).      lopinavir-ritonavir 400-100 MG/5ML solution   Commonly known as: KALETRA   Take 5 mLs (400 mg total) by mouth 2 (two) times daily with a meal.      morphine 2 MG/ML injection   Inject 1-2 mLs (2-4 mg total) into the vein every 2 (two) hours as needed.      multivitamin Tabs tablet   Take 1 tablet by mouth at bedtime.      Nebivolol HCl 20 MG Tabs   Take 20 mg by mouth daily.      ondansetron 4 MG/2ML Soln injection   Commonly known as: ZOFRAN   Inject 2 mLs (4 mg total) into the vein every 4 (four) hours as needed for nausea or vomiting.      pantoprazole sodium 40 mg/20 mL Pack   Commonly known as: PROTONIX   Take 20 mLs (40 mg total) by mouth daily at 12 noon.      paricalcitol 1 MCG capsule   Commonly known as: ZEMPLAR   Take 1 mcg by mouth daily.      pentafluoroprop-tetrafluoroeth Aero   Commonly known as: GEBAUERS   Apply 1 application topically as needed (topical anesthesia for hemodialysis).      piperacillin-tazobactam 2-0.25 GM/50ML IVPB   Commonly known as: ZOSYN   Inject 50 mLs (2.25 g total) into the vein every 8 (eight) hours.      potassium chloride SA 20 MEQ tablet   Commonly known as: K-DUR,KLOR-CON   Take 2 tablets (40 mEq total) by mouth 2 (two) times daily.      promethazine 25  MG/ML injection   Commonly known as: PHENERGAN   Inject 0.25-0.5 mLs (6.25-12.5 mg total) into the vein every 15 (fifteen) minutes as needed for nausea.      raltegravir 400 MG tablet   Commonly known as: ISENTRESS   Take 400 mg by mouth 2 (two) times daily.      Zidovudine 50 MG/5ML Syrp syrup   Commonly known as: RETROVIR   Take 30 mLs (300 mg total) by mouth daily.           Follow-up Information    Please follow up. (at [facility])           The results of significant diagnostics from this hospitalization (including imaging, microbiology, ancillary and laboratory) are listed below for reference.    Significant Diagnostic Studies: Ct Abdomen Wo Contrast  02/22/2012  *RADIOLOGY REPORT*  Clinical Data:  Testicular and flank pain, urinary retention, immunosuppressed.  CT ABDOMEN AND PELVIS WITHOUT CONTRAST  Technique:  Multidetector CT imaging of the abdomen and pelvis was performed following the standard protocol without intravenous contrast.  Comparison:  07/13/2010 ultrasound  Findings:  Cardiomegaly.  Coronary artery calcification.  Low attenuation of the blood pool suggests anemia.  Lung bases are clear.  Degraded by patient motion.  Organ abnormality/lesion detection is limited in the absence of intravenous contrast. Within this limitation, unremarkable liver, spleen, pancreas, adrenal glands. Distended gallbladder.  No pericholecystic fluid or radiodense gallstones. No biliary ductal dilatation.  Atrophic kidneys with perinephric fat stranding.  Focal lesion detection is significantly limited given the motion.  There is suggestion of a 1.9 cm interpolar left renal lesion with thin peripheral calcification (image 26 of series 2).  No hydronephrosis or hydroureter.  No bowel obstruction.  Normal appendix.  Circumferential bladder wall thickening, nonspecific given incomplete distension.  Vas deferens calcifications suggest diabetes.  There is scattered atherosclerotic calcification  of the  aorta and its branches. No aneurysmal dilatation.  There is peri-anal gas and a more focal pocket measuring 3.2 x 1.3 cm on series 2 image 80. Air tracks superiorly along the left iliac vessels and left para-aortic retroperitoneum.  Inferiorly, extends throughout the perineum, the medial right thigh, the right hemiscrotum and penis, partially imaged.  IMPRESSION: Findings are concerning for Fournier gangrene, with gas noted to track along soft tissues of the penis, right hemiscrotum, perineum, right thigh, and left retroperitoneum.  There is a perianal gas collection which may reflect a perianal abscess as the potential source of infection. Emergent urologic consultation recommended.  Critical Value/emergent results were called by telephone at the time of interpretation on February 17, 2012  at 06:15 a.m.  to  Dr. Read Drivers, who verbally acknowledged these results.  1.9 cm interpolar left renal lesion with thin peripheral calcification. Recommend ultrasound follow-up.  Original Report Authenticated By: Waneta Martins, M.D.   US Renal Port  02/18/2012  *RADIOLOGY REPORT*  Clinical Data: Acute renal failure.  History of hypertension and HIV infection.  Question hydronephrosis.  RENAL/URINARY TRACT ULTRASOUND COMPLETE - PORTABLE  Comparison:  Abdominal CT 02/17/2012.  Abdominal ultrasound 07/13/2010.  Findings:  Right Kidney:  There is cortical thinning with diffusely increased echogenicity.  No hydronephrosis or focal renal abnormality is identified.  Renal length is 8.3 cm.  Left Kidney:  There is cortical thinning with diffusely increased echogenicity.  No hydronephrosis is present.  There is a small cyst in the interpolar region measuring 12 mm maximally.  Renal length is 9.7 cm.  Bladder:  Decompressed by Foley catheter.  IMPRESSION:  1.  Stable renal ultrasound with bilaterally increased echogenicity consistent with chronic medical renal disease. 2.  No hydronephrosis.  Original Report Authenticated By: Gerrianne Scale, M.D.   Dg Chest Port 1 View  02/22/2012  *RADIOLOGY REPORT*  Clinical Data: Pulmonary edema.  PORTABLE CHEST - 1 VIEW  Comparison: Chest x-ray 02/20/2012.  Findings: There is a right internal jugular perm catheter with tips terminating in the distal superior vena cava and superior aspect of the right atrium.  Previously noted left internal jugular central venous catheter has been removed.  No pneumothorax.  Lung volumes have decreased.  There are increasing bibasilar opacities favored to represent areas of atelectasis.  Minimal blunting of the left costophrenic sulcus could suggest a small left-sided pleural effusion.  Pulmonary venous congestion without frank pulmonary edema.  Mild cardiomegaly is unchanged.  Mediastinal contours are unremarkable.  Atherosclerotic calcifications within the arch of the aorta.  IMPRESSION: 1.  Support apparatus, as above. 2.  Low lung volumes with bibasilar subsegmental atelectasis and small left-sided pleural effusion. 3.  Mild cardiomegaly with mild pulmonary venous congestion.  No frank pulmonary edema at this time. 4.  Atherosclerosis.  Original Report Authenticated By: Florencia Reasons, M.D.   Dg Chest Port 1 View  02/20/2012  *RADIOLOGY REPORT*  Clinical Data: Status post dialysis catheter placement.  PORTABLE CHEST - 1 VIEW  Comparison: Chest radiograph 02/20/2012 at 5:14 hours.  Findings: Current radiograph at 10:26 hours.  A right IJ dialysis catheter has been placed.  One of the catheter tips is in the mid to distal superior vena cava, and the other catheter tip is at the junction of the superior vena cava and right atrium.  Left IJ dialysis catheter is stable, terminating near the junction of the innominate vein and superior vena cava.  Stable cardiomegaly. Pulmonary vascularity appears normal.  Aeration  of both lungs is improved compared to the chest radiograph performed earlier today. Decreasing left basilar atelectasis compared to earlier today. Negative  for pulmonary edema.  Negative for pneumothorax.  IMPRESSION: Satisfactory position of right IJ dialysis catheter.  Negative for pneumothorax. Improved aeration of the lungs compared to most recent prior.  Original Report Authenticated By: Britta Mccreedy, M.D.   Dg Chest Port 1 View  02/20/2012  *RADIOLOGY REPORT*  Clinical Data: Hemoptysis and shortness of breath.  PORTABLE CHEST - 1 VIEW  Comparison: 02/19/2012  Findings: Left jugular dialysis catheter tip in the left innominate vein region.  Hazy densities at the left lung base.  Negative for a pneumothorax.  There are low lung volumes and the trachea is midline. Heart size is upper limits of normal.  IMPRESSION: Low lung volumes and probable left basilar atelectasis.  Dialysis catheter tip in the left innominate vein.  This catheter positioning is not optimal and may result in poor flows.  Original Report Authenticated By: Richarda Overlie, M.D.   Dg Chest Portable 1 View  02/19/2012  *RADIOLOGY REPORT*  Clinical Data: Hemoptysis.  PORTABLE CHEST - 1 VIEW  Comparison: Chest x-ray 02/18/2012.  Findings: There is a left-sided internal jugular central venous catheter with tip terminating in the near the confluence of the left innominate and superior vena cava. Lung volumes are low.  No consolidative airspace disease.  No definite pleural effusions. Pulmonary venous congestion without frank pulmonary edema.  Mild enlargement of the cardiopericardial silhouette, increased compared to prior examination. The patient is rotated to the right on today's exam, resulting in distortion of the mediastinal contours and reduced diagnostic sensitivity and specificity for mediastinal pathology.  Atherosclerotic calcifications within the arch of the aorta.  IMPRESSION: 1.  Support apparatus, as above. 2.  Low lung volumes with increasing enlargement of the cardiopericardial silhouette (which may reflect increasing cardiac dilatation and/or the presence of pericardial effusion) and  pulmonary venous congestion (without frank pulmonary edema). 3.  Atherosclerosis.  Original Report Authenticated By: Florencia Reasons, M.D.   Dg Chest Portable 1 View  02/18/2012  *RADIOLOGY REPORT*  Clinical Data: Follow up hemoptysis.  PORTABLE CHEST - 1 VIEW  Comparison: 02/18/2012  Findings: Left jugular central line tip is near the upper SVC. Heart size is upper limits of normal.  Few densities at the left lung base are suggestive for atelectasis.  Remainder of the lungs are clear.  No evidence for a pneumothorax.  Trachea is midline.  IMPRESSION:  Left basilar atelectasis.  Stable position of the central line.  Original Report Authenticated By: Richarda Overlie, M.D.   Dg Chest Port 1 View  02/18/2012  *RADIOLOGY REPORT*  Clinical Data: Respiratory failure  PORTABLE CHEST - 1 VIEW  Comparison: 02/17/2012  Findings: Left IJ catheter terminates at the junction of the left brachiocephalic vein and superior vena cava, directed laterally. Cardiomegaly is stable.  Lung volumes are low.  There is mild peribronchial thickening at the lung bases.  Otherwise, the lungs appear clear.  No visible pleural effusion or pneumothorax.  IMPRESSION: Mild peribronchial thickening at the lung bases.  Otherwise, the lungs appear clear.  Original Report Authenticated By: Britta Mccreedy, M.D.   Dg Chest Port 1 View  02/17/2012  *RADIOLOGY REPORT*  Clinical Data: Line placement  PORTABLE CHEST - 1 VIEW  Comparison: 02/17/2012 at 1536 hours  Findings: Left IJ dual lumen catheter with its tip at the junction of the left brachiocephalic vein and SVC.  Lungs are essentially clear. No  pleural effusion or pneumothorax.  Mild cardiomegaly.  IMPRESSION: Left IJ dual lumen catheter with its tip at the junction of the left brachiocephalic vein and SVC.  No pneumothorax.  Original Report Authenticated By: Charline Bills, M.D.   Dg Chest Port 1 View  02/17/2012  *RADIOLOGY REPORT*  Clinical Data: Pulmonary edema  PORTABLE CHEST - 1 VIEW   Comparison: None.  Findings: Cardiomediastinal silhouette is unremarkable.  No acute infiltrate or pleural effusion.  No pulmonary edema.  Bony thorax is unremarkable.  IMPRESSION: No active disease.  Original Report Authenticated By: Natasha Mead, M.D.   Dg Fluoro Guide Cv Line-no Report  02/20/2012  CLINICAL DATA: diatec insertion, right   FLOURO GUIDE CV LINE  Fluoroscopy was utilized by the requesting physician.  No radiographic  interpretation.     Dg C-arm 1-60 Min  02/17/2012  *RADIOLOGY REPORT*  Clinical Data:  Fournier's gangrene  RETROGRADE URETHROGRAM  Comparison:  None.  Fluoroscopy Time:  45 seconds  This exam was performed by Dr. Sherron Monday.  Findings:  Two spot fluoroscopic views of the lower pelvis were performed.  On image #1, there is a small-bore catheter projecting over the penis, with contrast in the urinary bladder.  Bladder is normal in shape. Contrast is seen with an irregular shape, projecting over the expected location of the penis.  It is uncertain if this is some contrast extravasation within the soft tissues of the penis, distal urethral injury, or contrast contamination outside of the patient.  The second image shows a larger bore Foley catheter satisfactorily positioned, with the balloon inflated in the lower urinary bladder and contrast within the urinary bladder.  The previously described contrast extravasation over the penile region is no longer present. Question if it could be in a contamination adjacent to the patient. Numerous irregular locules of gas project over the perineum, consistent with the Fournier's gangrene seen by CT.  IMPRESSION: Two intraoperative images are submitted as described above.  Please also see Dr. Mina Marble report.  Original Report Authenticated By: Britta Mccreedy, M.D.    Microbiology: No results found for this or any previous visit (from the past 240 hour(s)).   Labs: Basic Metabolic Panel:  Lab 03/01/12 8657 02/29/12 0535 02/28/12 1103  02/26/12 0751  NA 134* 132* 131* 133*  K 2.5* 2.8* 3.8 4.1  CL 94* 94* 91* 92*  CO2 21 25 24 23   GLUCOSE 114* 130* 108* 112*  BUN 41* 22 33* 39*  CREATININE 7.08* 4.79* 7.10* 7.62*  CALCIUM 8.8 8.8 8.8 8.6  MG -- -- -- --  PHOS 7.2* -- 6.6* 7.1*   Liver Function Tests:  Lab 03/01/12 0903 02/28/12 1103 02/26/12 0751  AST 21 -- --  ALT 10 -- --  ALKPHOS 96 -- --  BILITOT 1.1 -- --  PROT 6.6 -- --  ALBUMIN 1.3* 1.4* 1.5*   No results found for this basename: LIPASE:5,AMYLASE:5 in the last 168 hours No results found for this basename: AMMONIA:5 in the last 168 hours CBC:  Lab 03/01/12 0903 02/29/12 0535 02/28/12 1103 02/26/12 0750 02/25/12 0537  WBC 8.1 10.6* 9.6 12.6* 13.4*  NEUTROABS -- -- -- -- 10.9*  HGB 7.1* 7.9* 7.6* 8.2* 8.1*  HCT 21.1* 23.5* 22.6* 24.2* 23.9*  MCV 87.2 86.7 86.3 85.5 85.4  PLT 307 271 260 206 167   Cardiac Enzymes: No results found for this basename: CKTOTAL:5,CKMB:5,CKMBINDEX:5,TROPONINI:5 in the last 168 hours BNP: BNP (last 3 results)  Basename 02/17/12 1830  PROBNP 42513.0*   CBG:  Lab 03/01/12 1146 03/01/12 0756 02/29/12 2142 02/29/12 1616 02/29/12 0747  GLUCAP 147* 96 113* 90 121*    Time coordinating discharge: Over 30 minutes  Signed:  Debbora Presto, MD  [facility] 03/01/2012, 4:03 PM Debbora Presto, MD  [facility] Pager [phone]  If 7PM-7AM, please contact night-coverage [email or web address] Password [email or web address]

## 2012-03-01 NOTE — Discharge Instructions (Signed)
AIDS Treatment, HAART There is no cure for AIDS at this time. Treatments are available that slow the disease for many years and improve the quality of life. Antiviral therapy suppresses the growth of the HIV virus in the body. A combination of several antiretroviral agents has been highly effective in reducing the number of HIV particles in the blood stream. This treatment is called Highly Active Anti-Retroviral Therapy (HAART). Success of this treatment is measured by a blood test called the viral load. This treatment can help improve the immune system and improve T-cell counts. HAART is not a cure for HIV. People on HAART with suppressed levels of HIV can still give others the HIV virus through sex or sharing of needles. There is good evidence that if the levels of HIV remain suppressed, and the CD4 count (used to assess the immune system of patients) remains high (greater than 200), that life and quality of life can be significantly prolonged and improved. Genetic tests can be used to determine if the virus has become resistant to a particular drug. These tests are useful in deciding the best drug combination and adjusting the drug if it starts to fail. When HIV becomes resistant to HAART, the therapy must be changed to try and suppress the resistant strain of HIV. Different combinations of medications are tried to reduce viral load. This may not be successful, and the patient may develop AIDS. RISK AND COMPLICATIONS HAART is a collection of different medications. They have their own side effects. Some common side effects are:  Feeling sick to your stomach.   Headache.   Weakness.   Fat accumulation on your back and belly (abdomen) called a "buffalo hump,"(lipodystrophy).   Malaise.  When used long-term, these medications may increase the risk of heart disease by affecting fat metabolism. If you are on HAART you will be carefully monitored for possible side effects. In addition, routine blood  tests measuring CD4 counts and HIV viral load should be taken every 3 to 4 months. The goal is to:  Get the CD4 count as close to normal as possible.   Suppress the HIV viral load to an undetectable level.  Other antiviral agents are being looked at. Many new drugs are in the pipeline. Growth factors that stimulate cell growth are sometimes used to treat low red blood cell count (anemia) and low white blood cell counts associated with AIDS. Examples of these are Epogen and G-CSF. Medications are also used to prevent infections such as pneumonia and can keep AIDS patients healthier for longer periods of time. Infections are treated as they occur.  HIV becomes resistant in patients who do not take their medications every day. Also, certain strains of HIV mutate easily and may become resistant to HAART very quickly. Take all medications as directed. MAKE SURE YOU:   Understand these instructions.   Will watch your condition.   Will get help right away if you are not doing well or get worse.  Document Released: 11/18/2002 Document Revised: 08/17/2011 Document Reviewed: 07/08/2008 [facility] Patient Information 2012 [place], [place].

## 2012-03-01 NOTE — Progress Notes (Signed)
Subjective:   Had debridement of groin yesterday per urology.  BP is better.  No new complaints  Objective Vital signs in last 24 hours: Filed Vitals:   02/29/12 1815 02/29/12 2147 03/01/12 0531 03/01/12 0905  BP: 155/87 140/77 136/78 128/65  Pulse: 77 83 90 91  Temp: 98.7 F (37.1 C) 98 F (36.7 C) 98.7 F (37.1 C) 98.2 F (36.8 C)  TempSrc: Oral Oral Oral Oral  Resp: 22 22 22 18   Height:      Weight:      SpO2: 99% 100% 100% 100%   Weight change:   Intake/Output Summary (Last 24 hours) at 03/01/12 1110 Last data filed at 03/01/12 0700  Gross per 24 hour  Intake    780 ml  Output     75 ml  Net    705 ml   Labs: Basic Metabolic Panel:  Lab 03/01/12 1610 02/29/12 0535 02/28/12 1103 02/26/12 0751  NA 134* 132* 131* --  K PENDING 2.8* 3.8 --  CL 94* 94* 91* --  CO2 21 25 24  --  GLUCOSE 114* 130* 108* --  BUN 41* 22 33* --  CREATININE 7.08* 4.79* 7.10* --  CALCIUM 8.8 8.8 8.8 --  ALB -- -- -- --  PHOS 7.2* -- 6.6* 7.1*   Liver Function Tests:  Lab 03/01/12 0903 02/28/12 1103 02/26/12 0751  AST 21 -- --  ALT 10 -- --  ALKPHOS 96 -- --  BILITOT 1.1 -- --  PROT 6.6 -- --  ALBUMIN 1.3* 1.4* 1.5*   No results found for this basename: LIPASE:3,AMYLASE:3 in the last 168 hours No results found for this basename: AMMONIA:3 in the last 168 hours CBC:  Lab 03/01/12 0903 02/29/12 0535 02/28/12 1103 02/26/12 0750 02/25/12 0537  WBC 8.1 10.6* 9.6 -- --  NEUTROABS -- -- -- -- 10.9*  HGB 7.1* 7.9* 7.6* -- --  HCT 21.1* 23.5* 22.6* -- --  MCV 87.2 86.7 86.3 85.5 85.4  PLT 307 271 260 -- --   Cardiac Enzymes: No results found for this basename: CKTOTAL:5,CKMB:5,CKMBINDEX:5,TROPONINI:5 in the last 168 hours CBG:  Lab 02/29/12 2142 02/29/12 1616 02/29/12 0747 02/28/12 2246 02/28/12 1713  GLUCAP 113* 90 121* 130* 137*    Iron Studies: No results found for this basename: IRON,TIBC,TRANSFERRIN,FERRITIN in the last 72 hours Studies/Results: No results  found. Medications: Infusions:    . sodium chloride 10 mL/hr at 02/29/12 2152    Scheduled Medications:    . antiseptic oral rinse  15 mL Mouth Rinse BID  . calcium acetate  1,334 mg Oral TID WC  . darbepoetin (ARANESP) injection - DIALYSIS  100 mcg Intravenous Q Wed-HD  . feeding supplement  30 mL Oral TID WC  . feeding supplement  1 Container Oral BID BM  . lamiVUDine  50 mg Oral Daily  . lopinavir-ritonavir  5 mL Oral BID WC  . multivitamin  1 tablet Oral QHS  . pantoprazole sodium  40 mg Oral Q1200  . paricalcitol  5 mcg Intravenous 3 times weekly  . piperacillin-tazobactam (ZOSYN)  IV  2.25 g Intravenous Q8H  . sodium chloride irrigation  1,000 mL Irrigation BID  . Zidovudine  300 mg Oral Daily    have reviewed scheduled and prn medications.  Physical Exam: General: alert, no new complaints Heart: RRR Lungs: mostly clear Abdomen: soft, nontender Extremities: no edema .  Groin not examined Dialysis Access: PC   I Assessment/ Plan: Pt is a 61 y.o. yo male who was  admitted on 02/17/2012 with  Fournier's gangrene and new start to HD Assessment/Plan: 1. 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 Continue HD , now on MWF schedule so far but will transition to TTS (schedule at [facility])  From his vein mapping, it appears that AVG is his only option- plan up in the air as far as timing of that  per VVS. HD via PC for now.  Will plan for next HD tomorrow (Saturday ) to get on his eventual OP regimen.  AVG placement maybe before discharge, but want it to be safe from infection standpoint.  2. Anemia- stable s/p PRBC transfusion- monitor with perineal wound site losses. On aranesp as well.  Will plan for one unit PRBc with HD tomorrow 3. Fournier's gangrene- s/p I&D and ongoing daily evaluations by urology, seen by ID and recommendations noted.  On zosyn 4. HTN/vol- BP variable- no BP meds yet 5. Hep C/HIV- on ARVs per ID recommendations 6. AMS - Improved with HD/wound  management 7. Bones- PTH 938, phos 7.1, calc 8.6- zemplar,  phoslo 8. Dispo-  Not sure what date of discharge might be.  Is set up at [facility] for HD, needs AVG, would like to do prior to discharge if possible   Evadene Wardrip A   03/01/2012,11:10 AM  LOS: 13 days

## 2012-03-01 NOTE — Significant Event (Signed)
CRITICAL VALUE ALERT  Critical value received:K 2.5  Date of notification:  6/21  Time of notification:  11:30   Critical value read back:yes  Nurse who received alert:  Britt Bottom  MD notified (1st page):  Izola Price  Time of first page:  11:35  MD notified (2nd page): Kathrene Bongo  Time of second page: 11:40  Responding MD:    Time MD responded:  Kathrene Bongo was made aware while on unit)

## 2012-03-01 NOTE — Progress Notes (Signed)
Pt had large amountbleeding to incision site. Dressing changed per pervious order. Wound irrigated with normal saline, packed with gauze soaked in daskins and 4x4 and abd pad applied to reinforce. Pts bleeding is contained. NP on call notified of pts condition and dressing changed and was okay with current dressing applied. No new orders given. Will continue to monitor.

## 2012-03-01 NOTE — Progress Notes (Signed)
   CARE MANAGEMENT NOTE 03/01/2012  Patient:  Bryan Gilmore, Bryan Gilmore   Account Number:  [id number]  Date Initiated:  02/19/2012  Documentation initiated by:  [facility]  Subjective/Objective Assessment:   Fourniers gangrene and perirectal abscess. Renal failure - requiring dialysis.     Action/Plan:   Anticipated DC Date:  02/26/2012   Anticipated DC Plan:  HOME W HOME HEALTH SERVICES  In-house referral  Clinical Social Worker      DC Planning Services  CM consult      Choice offered to / List presented to:             Status of service:  In process, will continue to follow Medicare Important Message given?   (If response is "NO", the following Medicare IM given date fields will be blank) Date Medicare IM given:   Date Additional Medicare IM given:    Discharge Disposition:    Per UR Regulation:  Reviewed for med. necessity/level of care/duration of stay  If discussed at Long Length of Stay Meetings, dates discussed:    Comments:  03/01/2012 [address] RN, [place] [phone] Advised by Driscilla Grammes [facility], patient approved by [employer].  02/27/2012 1500 Darlyne Russian RN, CCM  [phone] CM and SW spoke with patient regarding discharge planning for continued medical care. He lives alone and his nephew [name] can help as needed. Discussed the option of LTAC or SNF and he will have a copay. He will review the listing for the facilities and his nephew might be able to visit them. CM to continue to follow for discharge planning needs.  PCP - does not have one currently - in past has seen [facility].  [address] [name]  home [phone] or cell [phone]  02-22-12 1:30pm [name], RNBSN - [phone] Talked more with Mr. Celaya about discharge plan.  Does feel he will need assistance at home - new dialysis and probably wound care on discharge.  Discussed options - would be interested in [facility] - insurance only in network at [facility]. Discussed with physician - order  recieved.  Referred  to [facility].

## 2012-03-01 NOTE — Progress Notes (Signed)
Patient ID: Bryan Gilmore, male   DOB: 09/08/1951, 61 y.o.   MRN: [id number] [facility] PROGRESS NOTE  Bryan Gilmore WUJ:[id number] DOB: 02-10-1951 DOA: 02/17/2012 PCP: Sheila Oats, MD  Brief narrative:  61 year old male with HIV/AIDS (CD4 180, VL ~190,000), now ESRD on HD who was admitted 02/17/2012 for testicular discomfort and swelling and 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 Patient was found to have evolving CKD and was subsequently transferred to [facility] under PCCM. [name] now requires HD MWF but plan to transition to TTS schedule. Per renal recommendations AVG may be only access option and perhaps may be done prior to discharge.  Consultants:  Urology  Vascular  PCCM  Infectious disease  Nephrology  Procedures:  I & D 02/29/2012   Antibiotics:  Zosyn 02/20/2012 -->  Subjective: No significant events overnight.  Objective: Filed Vitals:   02/29/12 2147 03/01/12 0531 03/01/12 0905 03/01/12 1300  BP: 140/77 136/78 128/65 121/56  Pulse: 83 90 91 78  Temp: 98 F (36.7 C) 98.7 F (37.1 C) 98.2 F (36.8 C) 98.3 F (36.8 C)  TempSrc: Oral Oral Oral Oral  Resp: 22 22 18 16   Height:      Weight:      SpO2: 100% 100% 100% 99%    Intake/Output Summary (Last 24 hours) at 03/01/12 1441 Last data filed at 03/01/12 0700  Gross per 24 hour  Intake    780 ml  Output     75 ml  Net    705 ml    Exam:   General:  Pt is alert, follows commands appropriately, not in acute distress  Cardiovascular: Regular rate and rhythm, S1/S2, no murmurs, no rubs, no gallops  Respiratory: Clear to auscultation bilaterally, no wheezing, no crackles, no rhonchi  Abdomen: Soft, non tender, non distended, bowel sounds present, no guarding  Extremities: No edema, pulses DP and PT palpable bilaterally  Neuro: Grossly nonfocal  Data Reviewed: Basic Metabolic Panel:  Lab 03/01/12 9562 02/29/12 0535 02/28/12 1103 02/26/12 0751  NA  134* 132* 131* 133*  K 2.5* 2.8* 3.8 4.1  CL 94* 94* 91* 92*  CO2 21 25 24 23   GLUCOSE 114* 130* 108* 112*  BUN 41* 22 33* 39*  CREATININE 7.08* 4.79* 7.10* 7.62*  CALCIUM 8.8 8.8 8.8 8.6  PHOS 7.2* -- 6.6* 7.1*   Liver Function Tests:  Lab 03/01/12 0903 02/28/12 1103 02/26/12 0751  AST 21 -- --  ALT 10 -- --  ALKPHOS 96 -- --  BILITOT 1.1 -- --  PROT 6.6 -- --  ALBUMIN 1.3* 1.4* 1.5*   CBC:  Lab 03/01/12 0903 02/29/12 0535 02/28/12 1103 02/26/12 0750 02/25/12 0537  WBC 8.1 10.6* 9.6 12.6* 13.4*  HGB 7.1* 7.9* 7.6* 8.2* 8.1*  HCT 21.1* 23.5* 22.6* 24.2* 23.9*  MCV 87.2 86.7 86.3 85.5 85.4  PLT 307 271 260 206 167   CBG:  Lab 02/29/12 2142 02/29/12 1616 02/29/12 0747 02/28/12 2246 02/28/12 1713  GLUCAP 113* 90 121* 130* 137*    Studies: No results found.  Scheduled Meds:   . calcium acetate  1,334 mg Oral TID WC  . (ARANESP) inj - DIALYSIS  100 mcg Intravenous Q Wed-HD  . feeding supplement  30 mL Oral TID WC  . feeding supplement  1 Container Oral BID BM  . lamiVUDine  50 mg Oral Daily  . lopinavir-ritonavir  5 mL Oral BID WC  . multivitamin  1 tablet Oral QHS  .  pantoprazole sodium  40 mg Oral Q1200  . paricalcitol  5 mcg Intravenous 3 times weekly  .  (ZOSYN)  IV  2.25 g Intravenous Q8H  . potassium chloride  10 mEq Intravenous Q1 Hr x 4  . potassium chloride  40 mEq Oral BID  . sodium chloride irrigation  1,000 mL Irrigation BID  . Zidovudine  300 mg Oral Daily   Continuous Infusions:   . sodium chloride 10 mL/hr at 02/29/12 2152     Assessment/Plan:   Principal Problem:  *Fournier gangrene/perirectal abscess  - Status post debridement by Dr. Sherron Monday on 02/17/2012 and most recently 02/29/2012 - Cultures are still negative to date including deep tissue cultures and blood cultures.  - Patient was on Zosyn, vancomycin and clindamycin but at this time only on zosyn  - per urology pt will need foley for 4 months and is at high risk of developing  ablative stricture upon foley cath removal  Active Problems:  Sepsis secondary to fournier gangrene and perirectal abscess  - resolved, patient is hemodynamically stable and does not require pressors  - currently only on zosyn  - appreciate ID following   End-stage renal disease on HD, metabolic acidosis  - HD per renal schedule, next HD tomorrow 03/02/2012 - Plan for AVG once clear from infection; appreciate vascular surgery following  - Patient has a right IJ 23 cm Diatek catheter as access for dialysis.  - He was being set up to receive dialysis at [facility] dialysis unit on T/T/S second shift   HIV/AIDS, CD4 count <200 (CD4 180, VL 190,000)  - currently on retrovir, lamivudine and kaletra  - appreciate ID following  - ? Bactrim for PCP prophylaxis   Hepatitis C  -Normal AST/ALT. And normal INR.   Acute respiratory failure  - resolved, respiratory status stable at this time  - This is accompanied the severe metabolic acidosis as ventilatory failure.  - patient did not require intubation or mechanical ventilation.   Hemoptysis  - Reported that he coughed up large blood clot after surgery.  - secondary to minor trauma from intubation during anesthesia or secondary to platelets dysfunction from uremia. It might be both.  - No reported hemoptysis recently.   Altered mental status  -Secondary to SIRS and uremia, this is improved with antibiotics, wound debridement and hemodialysis.   Anemia of chronic kidney disease  - aranesp with HD  - per renal plan for transfusion with HD Saturday 6/22  Hyperphosphatemia  - continue zemplar   Severe protein calorie malnutrition  - nutrition consulted  Hypokalemia  - potassium with HD in AM   Code Status: Full  Family Communication: Pt at bedside  Disposition Plan: per PT evaluation SNF recomended   Debbora Presto, MD  [facility] Pager [phone]  If 7PM-7AM, please contact  night-coverage [email or web address] Password [email or web address] 03/01/2012, 2:41 PM   LOS: 13 days

## 2012-03-02 LAB — TYPE AND SCREEN
ABO/RH(D): B POS
Antibody Screen: NEGATIVE

## 2012-03-04 LAB — HLA B*5701: HLA B 5701: NEGATIVE

## 2012-03-06 ENCOUNTER — Other Ambulatory Visit: Payer: Self-pay | Admitting: *Deleted

## 2012-03-06 DIAGNOSIS — N186 End stage renal disease: Secondary | ICD-10-CM

## 2012-03-06 DIAGNOSIS — Z01810 Encounter for preprocedural cardiovascular examination: Secondary | ICD-10-CM

## 2012-03-12 ENCOUNTER — Telehealth: Payer: Self-pay | Admitting: *Deleted

## 2012-03-12 ENCOUNTER — Ambulatory Visit: Payer: Self-pay | Admitting: Internal Medicine

## 2012-03-12 NOTE — Telephone Encounter (Signed)
Called patient and left voice mail to call the clinic to schedule a lab appt and f/u.  He no showed his office visit today, but has not had labs since 04/2011. Wendall Mola

## 2012-03-28 ENCOUNTER — Encounter: Payer: Self-pay | Admitting: *Deleted

## 2012-03-28 ENCOUNTER — Other Ambulatory Visit: Payer: Self-pay | Admitting: *Deleted

## 2012-03-28 NOTE — Progress Notes (Signed)
Home/moble phome number is not correct, work number there is no answer.

## 2012-03-29 ENCOUNTER — Inpatient Hospital Stay (HOSPITAL_COMMUNITY): Payer: BC Managed Care – PPO

## 2012-03-29 ENCOUNTER — Encounter (HOSPITAL_COMMUNITY): Payer: Self-pay | Admitting: Anesthesiology

## 2012-03-29 ENCOUNTER — Encounter (HOSPITAL_COMMUNITY)
Admission: RE | Disposition: A | Discharge status: Skilled Nursing Facility | Payer: Self-pay | Source: Ambulatory Visit | Attending: Nephrology

## 2012-03-29 ENCOUNTER — Ambulatory Visit (HOSPITAL_COMMUNITY): Payer: BC Managed Care – PPO

## 2012-03-29 ENCOUNTER — Encounter (HOSPITAL_COMMUNITY): Payer: Self-pay | Admitting: *Deleted

## 2012-03-29 ENCOUNTER — Inpatient Hospital Stay (HOSPITAL_COMMUNITY)
Admission: RE | Admit: 2012-03-29 | Discharge: 2012-04-13 | DRG: 703 | Disposition: A | Discharge status: Skilled Nursing Facility | Payer: BC Managed Care – PPO | Source: Ambulatory Visit | Attending: Nephrology | Admitting: Nephrology

## 2012-03-29 ENCOUNTER — Inpatient Hospital Stay (HOSPITAL_COMMUNITY): Payer: BC Managed Care – PPO | Admitting: Anesthesiology

## 2012-03-29 DIAGNOSIS — N186 End stage renal disease: Secondary | ICD-10-CM

## 2012-03-29 DIAGNOSIS — R509 Fever, unspecified: Secondary | ICD-10-CM | POA: Diagnosis present

## 2012-03-29 DIAGNOSIS — I4821 Permanent atrial fibrillation: Secondary | ICD-10-CM | POA: Diagnosis present

## 2012-03-29 DIAGNOSIS — M726 Necrotizing fasciitis: Secondary | ICD-10-CM | POA: Diagnosis present

## 2012-03-29 DIAGNOSIS — E039 Hypothyroidism, unspecified: Secondary | ICD-10-CM | POA: Diagnosis not present

## 2012-03-29 DIAGNOSIS — B370 Candidal stomatitis: Secondary | ICD-10-CM | POA: Diagnosis present

## 2012-03-29 DIAGNOSIS — Y849 Medical procedure, unspecified as the cause of abnormal reaction of the patient, or of later complication, without mention of misadventure at the time of the procedure: Secondary | ICD-10-CM | POA: Diagnosis present

## 2012-03-29 DIAGNOSIS — Z992 Dependence on renal dialysis: Secondary | ICD-10-CM

## 2012-03-29 DIAGNOSIS — B1920 Unspecified viral hepatitis C without hepatic coma: Secondary | ICD-10-CM | POA: Diagnosis present

## 2012-03-29 DIAGNOSIS — R000 Tachycardia, unspecified: Secondary | ICD-10-CM | POA: Diagnosis not present

## 2012-03-29 DIAGNOSIS — E43 Unspecified severe protein-calorie malnutrition: Secondary | ICD-10-CM | POA: Diagnosis present

## 2012-03-29 DIAGNOSIS — Z933 Colostomy status: Secondary | ICD-10-CM | POA: Diagnosis present

## 2012-03-29 DIAGNOSIS — E875 Hyperkalemia: Principal | ICD-10-CM | POA: Diagnosis present

## 2012-03-29 DIAGNOSIS — B1710 Acute hepatitis C without hepatic coma: Secondary | ICD-10-CM | POA: Diagnosis present

## 2012-03-29 DIAGNOSIS — D649 Anemia, unspecified: Secondary | ICD-10-CM | POA: Diagnosis present

## 2012-03-29 DIAGNOSIS — N390 Urinary tract infection, site not specified: Secondary | ICD-10-CM | POA: Diagnosis not present

## 2012-03-29 DIAGNOSIS — Z66 Do not resuscitate: Secondary | ICD-10-CM | POA: Diagnosis present

## 2012-03-29 DIAGNOSIS — Z79899 Other long term (current) drug therapy: Secondary | ICD-10-CM

## 2012-03-29 DIAGNOSIS — M109 Gout, unspecified: Secondary | ICD-10-CM | POA: Diagnosis present

## 2012-03-29 DIAGNOSIS — I4891 Unspecified atrial fibrillation: Secondary | ICD-10-CM | POA: Diagnosis present

## 2012-03-29 DIAGNOSIS — N2581 Secondary hyperparathyroidism of renal origin: Secondary | ICD-10-CM | POA: Diagnosis present

## 2012-03-29 DIAGNOSIS — T8189XA Other complications of procedures, not elsewhere classified, initial encounter: Secondary | ICD-10-CM | POA: Diagnosis present

## 2012-03-29 DIAGNOSIS — B20 Human immunodeficiency virus [HIV] disease: Secondary | ICD-10-CM | POA: Diagnosis present

## 2012-03-29 DIAGNOSIS — R5381 Other malaise: Secondary | ICD-10-CM | POA: Diagnosis present

## 2012-03-29 DIAGNOSIS — I96 Gangrene, not elsewhere classified: Secondary | ICD-10-CM | POA: Diagnosis present

## 2012-03-29 DIAGNOSIS — N501 Vascular disorders of male genital organs: Secondary | ICD-10-CM | POA: Diagnosis present

## 2012-03-29 DIAGNOSIS — G9341 Metabolic encephalopathy: Secondary | ICD-10-CM | POA: Diagnosis present

## 2012-03-29 DIAGNOSIS — D72829 Elevated white blood cell count, unspecified: Secondary | ICD-10-CM | POA: Diagnosis present

## 2012-03-29 DIAGNOSIS — A4902 Methicillin resistant Staphylococcus aureus infection, unspecified site: Secondary | ICD-10-CM | POA: Diagnosis present

## 2012-03-29 DIAGNOSIS — Z781 Physical restraint status: Secondary | ICD-10-CM | POA: Diagnosis not present

## 2012-03-29 DIAGNOSIS — R410 Disorientation, unspecified: Secondary | ICD-10-CM | POA: Diagnosis present

## 2012-03-29 DIAGNOSIS — I120 Hypertensive chronic kidney disease with stage 5 chronic kidney disease or end stage renal disease: Secondary | ICD-10-CM | POA: Diagnosis present

## 2012-03-29 DIAGNOSIS — I4892 Unspecified atrial flutter: Secondary | ICD-10-CM | POA: Diagnosis not present

## 2012-03-29 HISTORY — DX: Gout, unspecified: M10.9

## 2012-03-29 HISTORY — DX: End stage renal disease: N18.6

## 2012-03-29 HISTORY — DX: Unspecified severe protein-calorie malnutrition: E43

## 2012-03-29 HISTORY — DX: Alcohol abuse, uncomplicated: F10.10

## 2012-03-29 HISTORY — PX: INSERTION OF DIALYSIS CATHETER: SHX1324

## 2012-03-29 HISTORY — DX: Fournier gangrene: N49.3

## 2012-03-29 HISTORY — DX: Unspecified viral hepatitis C without hepatic coma: B19.20

## 2012-03-29 HISTORY — DX: Anemia, unspecified: D64.9

## 2012-03-29 HISTORY — DX: Sepsis, unspecified organism: A41.9

## 2012-03-29 LAB — POCT I-STAT 4, (NA,K, GLUC, HGB,HCT)
Glucose, Bld: 96 mg/dL (ref 70–99)
HCT: 29 % — ABNORMAL LOW (ref 39.0–52.0)
Hemoglobin: 9.9 g/dL — ABNORMAL LOW (ref 13.0–17.0)
Hemoglobin: 9.2 g/dL — ABNORMAL LOW (ref 13.0–17.0)
Sodium: 145 mEq/L (ref 135–145)

## 2012-03-29 LAB — SURGICAL PCR SCREEN
MRSA, PCR: POSITIVE — AB
Staphylococcus aureus: POSITIVE — AB

## 2012-03-29 LAB — POTASSIUM: Potassium: 6.8 mEq/L (ref 3.5–5.1)

## 2012-03-29 SURGERY — INSERTION OF DIALYSIS CATHETER
Anesthesia: General | Site: Neck | Wound class: Dirty or Infected

## 2012-03-29 SURGERY — INSERTION OF DIALYSIS CATHETER
Anesthesia: Monitor Anesthesia Care

## 2012-03-29 MED ORDER — CEFAZOLIN SODIUM 1-5 GM-% IV SOLN
INTRAVENOUS | Status: AC
  Filled 2012-03-29: qty 50

## 2012-03-29 MED ORDER — ONDANSETRON HCL 4 MG/2ML IJ SOLN: 4.0000 mg | Freq: Four times a day (QID) | INTRAMUSCULAR | Status: DC | PRN

## 2012-03-29 MED ORDER — NEBIVOLOL HCL 10 MG PO TABS
20.0000 mg | ORAL_TABLET | Freq: Every day | ORAL | Status: DC
  Filled 2012-03-29: qty 2

## 2012-03-29 MED ORDER — LORAZEPAM 2 MG/ML IJ SOLN
0.5000 mg | Freq: Four times a day (QID) | INTRAMUSCULAR | Status: DC | PRN
  Administered 2012-03-29 – 2012-04-01 (×4): 0.5 mg via INTRAVENOUS
  Filled 2012-03-29 (×2): qty 1

## 2012-03-29 MED ORDER — HYDROXYZINE HCL 25 MG PO TABS
25.0000 mg | ORAL_TABLET | Freq: Three times a day (TID) | ORAL | Status: DC | PRN
Start: 2012-03-29 — End: 2012-03-29

## 2012-03-29 MED ORDER — SODIUM POLYSTYRENE SULFONATE 15 GM/60ML PO SUSP
30.0000 g | Freq: Once | ORAL | Status: AC
  Administered 2012-03-29: 30 g via ORAL
  Filled 2012-03-29: qty 120

## 2012-03-29 MED ORDER — SODIUM CHLORIDE 0.9 % IV SOLN
INTRAVENOUS | Status: DC | PRN
  Administered 2012-03-29: 13:00:00 via INTRAVENOUS

## 2012-03-29 MED ORDER — NEBIVOLOL HCL 20 MG PO TABS: 20.0000 mg | ORAL_TABLET | Freq: Every day | ORAL | Status: DC

## 2012-03-29 MED ORDER — FENTANYL CITRATE 0.05 MG/ML IJ SOLN: 25.0000 ug | INTRAMUSCULAR | Status: DC | PRN

## 2012-03-29 MED ORDER — SODIUM CHLORIDE 0.9 % IV SOLN: 100.0000 mL | INTRAVENOUS | Status: DC | PRN

## 2012-03-29 MED ORDER — LIDOCAINE-EPINEPHRINE 0.5 %-1:200000 IJ SOLN
INTRAMUSCULAR | Status: DC | PRN
  Administered 2012-03-29: 10 mL

## 2012-03-29 MED ORDER — QUETIAPINE FUMARATE 25 MG PO TABS
25.0000 mg | ORAL_TABLET | Freq: Three times a day (TID) | ORAL | Status: DC
  Administered 2012-03-30 – 2012-03-31 (×3): 25 mg via ORAL
  Filled 2012-03-29 (×8): qty 1

## 2012-03-29 MED ORDER — ACETAMINOPHEN 650 MG RE SUPP: 650.0000 mg | Freq: Four times a day (QID) | RECTAL | Status: DC | PRN

## 2012-03-29 MED ORDER — SODIUM CHLORIDE 0.9 % IV SOLN: INTRAVENOUS | Status: DC

## 2012-03-29 MED ORDER — MUPIROCIN 2 % EX OINT
TOPICAL_OINTMENT | Freq: Two times a day (BID) | CUTANEOUS | Status: DC
  Administered 2012-03-29 – 2012-04-12 (×30): via NASAL
  Filled 2012-03-29 (×2): qty 22

## 2012-03-29 MED ORDER — ONDANSETRON HCL 4 MG/2ML IJ SOLN
INTRAMUSCULAR | Status: DC | PRN
  Administered 2012-03-29: 4 mg via INTRAVENOUS

## 2012-03-29 MED ORDER — CLONAZEPAM 0.5 MG PO TABS
0.5000 mg | ORAL_TABLET | Freq: Two times a day (BID) | ORAL | Status: DC
  Administered 2012-03-30 – 2012-03-31 (×3): 0.5 mg via ORAL
  Filled 2012-03-29 (×4): qty 1

## 2012-03-29 MED ORDER — LORAZEPAM 2 MG/ML IJ SOLN
INTRAMUSCULAR | Status: AC
  Administered 2012-03-29: 0.5 mg via INTRAVENOUS
  Filled 2012-03-29: qty 1

## 2012-03-29 MED ORDER — FENTANYL CITRATE 0.05 MG/ML IJ SOLN
INTRAMUSCULAR | Status: DC | PRN
  Administered 2012-03-29: 50 ug via INTRAVENOUS

## 2012-03-29 MED ORDER — DOCUSATE SODIUM 283 MG RE ENEM: 1.0000 | ENEMA | RECTAL | Status: DC | PRN

## 2012-03-29 MED ORDER — DEXTROSE 5 % IV SOLN
1.5000 g | INTRAVENOUS | Status: DC
  Filled 2012-03-29: qty 1.5

## 2012-03-29 MED ORDER — ALLOPURINOL 300 MG PO TABS
300.0000 mg | ORAL_TABLET | Freq: Every day | ORAL | Status: DC
Start: 2012-03-29 — End: 2012-04-13
  Administered 2012-03-30 – 2012-04-13 (×15): 300 mg via ORAL
  Filled 2012-03-29 (×16): qty 1

## 2012-03-29 MED ORDER — PROPOFOL 10 MG/ML IV EMUL
INTRAVENOUS | Status: DC | PRN
  Administered 2012-03-29: 25 ug/kg/min via INTRAVENOUS

## 2012-03-29 MED ORDER — LIDOCAINE HCL (PF) 1 % IJ SOLN: 5.0000 mL | INTRAMUSCULAR | Status: DC | PRN

## 2012-03-29 MED ORDER — PENTAFLUOROPROP-TETRAFLUOROETH EX AERO: 1.0000 "application " | INHALATION_SPRAY | CUTANEOUS | Status: DC | PRN

## 2012-03-29 MED ORDER — CAMPHOR-MENTHOL 0.5-0.5 % EX LOTN
1.0000 "application " | TOPICAL_LOTION | Freq: Three times a day (TID) | CUTANEOUS | Status: DC | PRN
  Filled 2012-03-29: qty 222

## 2012-03-29 MED ORDER — MIRTAZAPINE 15 MG PO TABS
15.0000 mg | ORAL_TABLET | Freq: Every day | ORAL | Status: DC
  Administered 2012-03-31: 15 mg via ORAL
  Filled 2012-03-29 (×3): qty 1

## 2012-03-29 MED ORDER — HEPARIN SODIUM (PORCINE) 1000 UNIT/ML IJ SOLN
INTRAMUSCULAR | Status: AC
  Filled 2012-03-29: qty 1

## 2012-03-29 MED ORDER — RENA-VITE PO TABS
1.0000 | ORAL_TABLET | Freq: Every day | ORAL | Status: DC
  Administered 2012-03-31 – 2012-04-12 (×14): 1 via ORAL
  Filled 2012-03-29 (×16): qty 1

## 2012-03-29 MED ORDER — QUETIAPINE FUMARATE 50 MG PO TABS
50.0000 mg | ORAL_TABLET | Freq: Three times a day (TID) | ORAL | Status: DC
  Filled 2012-03-29 (×2): qty 1

## 2012-03-29 MED ORDER — LIDOCAINE-PRILOCAINE 2.5-2.5 % EX CREA: 1.0000 "application " | TOPICAL_CREAM | CUTANEOUS | Status: DC | PRN

## 2012-03-29 MED ORDER — ONDANSETRON HCL 4 MG PO TABS: 4.0000 mg | ORAL_TABLET | Freq: Four times a day (QID) | ORAL | Status: DC | PRN

## 2012-03-29 MED ORDER — NEPRO/CARBSTEADY PO LIQD: 237.0000 mL | Freq: Three times a day (TID) | ORAL | Status: DC | PRN

## 2012-03-29 MED ORDER — ALTEPLASE 100 MG IV SOLR
4.0000 mg | INTRAVENOUS | Status: AC
  Administered 2012-03-29: 4 mg
  Filled 2012-03-29 (×2): qty 4

## 2012-03-29 MED ORDER — ACETAMINOPHEN 325 MG PO TABS
650.0000 mg | ORAL_TABLET | Freq: Four times a day (QID) | ORAL | Status: DC | PRN
  Administered 2012-03-31 – 2012-04-13 (×7): 650 mg via ORAL
  Filled 2012-03-29 (×6): qty 2

## 2012-03-29 MED ORDER — CAMPHOR-MENTHOL 0.5-0.5 % EX LOTN: 1.0000 "application " | TOPICAL_LOTION | Freq: Three times a day (TID) | CUTANEOUS | Status: DC | PRN

## 2012-03-29 MED ORDER — NEBIVOLOL HCL 10 MG PO TABS
20.0000 mg | ORAL_TABLET | Freq: Once | ORAL | Status: AC
  Administered 2012-03-29: 20 mg via ORAL
  Filled 2012-03-29: qty 2

## 2012-03-29 MED ORDER — CALCIUM CARBONATE 1250 MG/5ML PO SUSP: 500.0000 mg | Freq: Four times a day (QID) | ORAL | Status: DC | PRN

## 2012-03-29 MED ORDER — LOPINAVIR-RITONAVIR 400-100 MG/5ML PO SOLN
400.0000 mg | Freq: Two times a day (BID) | ORAL | Status: DC
  Filled 2012-03-29 (×2): qty 5

## 2012-03-29 MED ORDER — ACETAMINOPHEN 325 MG PO TABS: 650.0000 mg | ORAL_TABLET | ORAL | Status: DC | PRN

## 2012-03-29 MED ORDER — ZOLPIDEM TARTRATE 5 MG PO TABS: 5.0000 mg | ORAL_TABLET | Freq: Every evening | ORAL | Status: DC | PRN

## 2012-03-29 MED ORDER — HYDROXYZINE HCL 25 MG PO TABS: 25.0000 mg | ORAL_TABLET | Freq: Three times a day (TID) | ORAL | Status: DC | PRN

## 2012-03-29 MED ORDER — NEPRO/CARBSTEADY PO LIQD
237.0000 mL | Freq: Two times a day (BID) | ORAL | Status: DC
  Administered 2012-03-30 – 2012-03-31 (×2): 237 mL via ORAL

## 2012-03-29 MED ORDER — SODIUM CHLORIDE 0.9 % IV SOLN: 250.0000 mL | INTRAVENOUS | Status: DC | PRN

## 2012-03-29 MED ORDER — LORAZEPAM 2 MG/ML IJ SOLN
INTRAMUSCULAR | Status: AC
  Administered 2012-03-29: 1 mg via INTRAVENOUS
  Filled 2012-03-29: qty 1

## 2012-03-29 MED ORDER — ALTEPLASE 2 MG IJ SOLR
2.0000 mg | Freq: Once | INTRAMUSCULAR | Status: AC | PRN
  Filled 2012-03-29: qty 2

## 2012-03-29 MED ORDER — SORBITOL 70 % SOLN: 30.0000 mL | Status: DC | PRN

## 2012-03-29 MED ORDER — HEPARIN SODIUM (PORCINE) 1000 UNIT/ML DIALYSIS: 20.0000 [IU]/kg | INTRAMUSCULAR | Status: DC | PRN

## 2012-03-29 MED ORDER — ZIDOVUDINE 100 MG PO CAPS
300.0000 mg | ORAL_CAPSULE | Freq: Every day | ORAL | Status: DC
  Filled 2012-03-29: qty 3

## 2012-03-29 MED ORDER — LORAZEPAM 2 MG/ML IJ SOLN
1.0000 mg | Freq: Once | INTRAMUSCULAR | Status: AC
  Administered 2012-03-29: 1 mg via INTRAVENOUS

## 2012-03-29 MED ORDER — CEFAZOLIN SODIUM 1-5 GM-% IV SOLN
INTRAVENOUS | Status: DC | PRN
  Administered 2012-03-29: 1 g via INTRAVENOUS

## 2012-03-29 MED ORDER — HEPARIN SODIUM (PORCINE) 5000 UNIT/ML IJ SOLN
INTRAMUSCULAR | Status: DC | PRN
  Administered 2012-03-29: 14:00:00

## 2012-03-29 MED ORDER — SODIUM CHLORIDE 0.9 % IJ SOLN: 3.0000 mL | INTRAMUSCULAR | Status: DC | PRN

## 2012-03-29 MED ORDER — LIDOCAINE-EPINEPHRINE 0.5 %-1:200000 IJ SOLN
INTRAMUSCULAR | Status: AC
  Filled 2012-03-29: qty 1

## 2012-03-29 MED ORDER — SODIUM CHLORIDE 0.9 % IR SOLN
Status: DC | PRN
  Administered 2012-03-29: 1000 mL

## 2012-03-29 MED ORDER — SODIUM CHLORIDE 0.9 % IJ SOLN
3.0000 mL | Freq: Two times a day (BID) | INTRAMUSCULAR | Status: DC
  Administered 2012-03-31 – 2012-04-12 (×19): 3 mL via INTRAVENOUS

## 2012-03-29 MED ORDER — HEPARIN SODIUM (PORCINE) 1000 UNIT/ML DIALYSIS
1000.0000 [IU] | INTRAMUSCULAR | Status: DC | PRN
  Filled 2012-03-29: qty 1

## 2012-03-29 MED ORDER — ACETAMINOPHEN 325 MG PO TABS: 650.0000 mg | ORAL_TABLET | Freq: Four times a day (QID) | ORAL | Status: DC | PRN

## 2012-03-29 MED ORDER — HEPARIN SODIUM (PORCINE) 1000 UNIT/ML IJ SOLN
INTRAMUSCULAR | Status: DC | PRN
  Administered 2012-03-29: 4.6 mL

## 2012-03-29 MED ORDER — PARICALCITOL 5 MCG/ML IV SOLN
2.0000 ug | INTRAVENOUS | Status: DC
  Administered 2012-03-30 – 2012-04-13 (×7): 2 ug via INTRAVENOUS
  Filled 2012-03-29 (×7): qty 0.4

## 2012-03-29 MED ORDER — NEPRO/CARBSTEADY PO LIQD: 237.0000 mL | ORAL | Status: DC | PRN

## 2012-03-29 MED ORDER — CALCIUM ACETATE 667 MG PO CAPS
1334.0000 mg | ORAL_CAPSULE | Freq: Three times a day (TID) | ORAL | Status: DC
  Administered 2012-03-30 – 2012-04-05 (×13): 1334 mg via ORAL
  Filled 2012-03-29 (×23): qty 2

## 2012-03-29 MED ORDER — PANTOPRAZOLE SODIUM 40 MG PO TBEC
40.0000 mg | DELAYED_RELEASE_TABLET | Freq: Every day | ORAL | Status: DC
  Administered 2012-03-30 – 2012-04-13 (×15): 40 mg via ORAL
  Filled 2012-03-29 (×15): qty 1

## 2012-03-29 SURGICAL SUPPLY — 42 items
BAG DECANTER FOR FLEXI CONT (MISCELLANEOUS) ×3 IMPLANT
CATH CANNON HEMO 15F 50CM (CATHETERS) IMPLANT
CATH CANNON HEMO 15FR 19 (HEMODIALYSIS SUPPLIES) IMPLANT
CATH CANNON HEMO 15FR 23CM (HEMODIALYSIS SUPPLIES) IMPLANT
CATH CANNON HEMO 15FR 31CM (HEMODIALYSIS SUPPLIES) IMPLANT
CATH CANNON HEMO 15FR 32 (HEMODIALYSIS SUPPLIES) IMPLANT
CATH CANNON HEMO 15FR 32CM (HEMODIALYSIS SUPPLIES) IMPLANT
CLIP LIGATING EXTRA MED SLVR (CLIP) ×3 IMPLANT
CLIP LIGATING EXTRA SM BLUE (MISCELLANEOUS) ×3 IMPLANT
CLOTH BEACON ORANGE TIMEOUT ST (SAFETY) ×3 IMPLANT
COVER PROBE W GEL 5X96 (DRAPES) IMPLANT
COVER SURGICAL LIGHT HANDLE (MISCELLANEOUS) ×3 IMPLANT
DECANTER SPIKE VIAL GLASS SM (MISCELLANEOUS) ×3 IMPLANT
DRAPE C-ARM 42X72 X-RAY (DRAPES) ×3 IMPLANT
DRAPE CHEST BREAST 15X10 FENES (DRAPES) ×3 IMPLANT
GAUZE SPONGE 2X2 8PLY STRL LF (GAUZE/BANDAGES/DRESSINGS) ×2 IMPLANT
GAUZE SPONGE 4X4 16PLY XRAY LF (GAUZE/BANDAGES/DRESSINGS) ×3 IMPLANT
GLOVE SS BIOGEL STRL SZ 7.5 (GLOVE) ×2 IMPLANT
GLOVE SUPERSENSE BIOGEL SZ 7.5 (GLOVE) ×1
GOWN STRL NON-REIN LRG LVL3 (GOWN DISPOSABLE) ×6 IMPLANT
KIT BASIN OR (CUSTOM PROCEDURE TRAY) ×3 IMPLANT
KIT ROOM TURNOVER OR (KITS) ×3 IMPLANT
NDL 18GX1X1/2 (RX/OR ONLY) (NEEDLE) ×1 IMPLANT
NDL HYPO 25GX1X1/2 BEV (NEEDLE) ×1 IMPLANT
NEEDLE 18GX1X1/2 (RX/OR ONLY) (NEEDLE) ×2 IMPLANT
NEEDLE 22X1 1/2 (OR ONLY) (NEEDLE) ×3 IMPLANT
NEEDLE HYPO 25GX1X1/2 BEV (NEEDLE) ×2 IMPLANT
NS IRRIG 1000ML POUR BTL (IV SOLUTION) ×3 IMPLANT
PACK SURGICAL SETUP 50X90 (CUSTOM PROCEDURE TRAY) ×3 IMPLANT
PAD ARMBOARD 7.5X6 YLW CONV (MISCELLANEOUS) ×6 IMPLANT
SOAP 2 % CHG 4 OZ (WOUND CARE) ×3 IMPLANT
SPONGE GAUZE 2X2 STER 10/PKG (GAUZE/BANDAGES/DRESSINGS) ×1
SUT ETHILON 3 0 PS 1 (SUTURE) ×3 IMPLANT
SUT VICRYL 4-0 PS2 18IN ABS (SUTURE) ×3 IMPLANT
SYR 20CC LL (SYRINGE) ×3 IMPLANT
SYR 30ML LL (SYRINGE) IMPLANT
SYR 5ML LL (SYRINGE) ×6 IMPLANT
SYR CONTROL 10ML LL (SYRINGE) ×3 IMPLANT
SYRINGE 10CC LL (SYRINGE) ×3 IMPLANT
TOWEL OR 17X24 6PK STRL BLUE (TOWEL DISPOSABLE) ×3 IMPLANT
TOWEL OR 17X26 10 PK STRL BLUE (TOWEL DISPOSABLE) ×3 IMPLANT
WATER STERILE IRR 1000ML POUR (IV SOLUTION) ×3 IMPLANT

## 2012-03-29 SURGICAL SUPPLY — 47 items
BAG DECANTER FOR FLEXI CONT (MISCELLANEOUS) ×2 IMPLANT
CATH CANNON HEMO 15F 50CM (CATHETERS) IMPLANT
CATH CANNON HEMO 15FR 19 (HEMODIALYSIS SUPPLIES) IMPLANT
CATH CANNON HEMO 15FR 23CM (HEMODIALYSIS SUPPLIES) ×1 IMPLANT
CATH CANNON HEMO 15FR 31CM (HEMODIALYSIS SUPPLIES) IMPLANT
CATH CANNON HEMO 15FR 32 (HEMODIALYSIS SUPPLIES) IMPLANT
CATH CANNON HEMO 15FR 32CM (HEMODIALYSIS SUPPLIES) IMPLANT
CLIP LIGATING EXTRA MED SLVR (CLIP) ×2 IMPLANT
CLIP LIGATING EXTRA SM BLUE (MISCELLANEOUS) ×2 IMPLANT
CLOTH BEACON ORANGE TIMEOUT ST (SAFETY) ×2 IMPLANT
CLSR STERI-STRIP ANTIMIC 1/2X4 (GAUZE/BANDAGES/DRESSINGS) ×1 IMPLANT
COVER PROBE W GEL 5X96 (DRAPES) IMPLANT
COVER SURGICAL LIGHT HANDLE (MISCELLANEOUS) ×2 IMPLANT
DECANTER SPIKE VIAL GLASS SM (MISCELLANEOUS) ×2 IMPLANT
DRAPE C-ARM 42X72 X-RAY (DRAPES) ×2 IMPLANT
DRAPE CHEST BREAST 15X10 FENES (DRAPES) ×2 IMPLANT
GAUZE SPONGE 2X2 8PLY STRL LF (GAUZE/BANDAGES/DRESSINGS) ×1 IMPLANT
GAUZE SPONGE 4X4 16PLY XRAY LF (GAUZE/BANDAGES/DRESSINGS) ×2 IMPLANT
GLOVE BIOGEL PI IND STRL 6.5 (GLOVE) IMPLANT
GLOVE BIOGEL PI INDICATOR 6.5 (GLOVE) ×1
GLOVE ECLIPSE 6.5 STRL STRAW (GLOVE) ×1 IMPLANT
GLOVE SS BIOGEL STRL SZ 7.5 (GLOVE) ×1 IMPLANT
GLOVE SUPERSENSE BIOGEL SZ 7.5 (GLOVE) ×1
GOWN STRL NON-REIN LRG LVL3 (GOWN DISPOSABLE) ×4 IMPLANT
KIT BASIN OR (CUSTOM PROCEDURE TRAY) ×2 IMPLANT
KIT ROOM TURNOVER OR (KITS) ×2 IMPLANT
NDL 18GX1X1/2 (RX/OR ONLY) (NEEDLE) ×1 IMPLANT
NDL HYPO 25GX1X1/2 BEV (NEEDLE) ×1 IMPLANT
NEEDLE 18GX1X1/2 (RX/OR ONLY) (NEEDLE) ×2 IMPLANT
NEEDLE 22X1 1/2 (OR ONLY) (NEEDLE) ×2 IMPLANT
NEEDLE HYPO 25GX1X1/2 BEV (NEEDLE) ×2 IMPLANT
NS IRRIG 1000ML POUR BTL (IV SOLUTION) ×2 IMPLANT
PACK SURGICAL SETUP 50X90 (CUSTOM PROCEDURE TRAY) ×2 IMPLANT
PAD ARMBOARD 7.5X6 YLW CONV (MISCELLANEOUS) ×4 IMPLANT
SOAP 2 % CHG 4 OZ (WOUND CARE) ×2 IMPLANT
SPONGE GAUZE 2X2 STER 10/PKG (GAUZE/BANDAGES/DRESSINGS) ×1
SUT ETHILON 3 0 PS 1 (SUTURE) ×2 IMPLANT
SUT VICRYL 4-0 PS2 18IN ABS (SUTURE) ×2 IMPLANT
SYR 20CC LL (SYRINGE) ×2 IMPLANT
SYR 30ML LL (SYRINGE) IMPLANT
SYR 5ML LL (SYRINGE) ×4 IMPLANT
SYR CONTROL 10ML LL (SYRINGE) ×2 IMPLANT
SYRINGE 10CC LL (SYRINGE) ×2 IMPLANT
TAPE CLOTH SURG 4X10 WHT LF (GAUZE/BANDAGES/DRESSINGS) ×1 IMPLANT
TOWEL OR 17X24 6PK STRL BLUE (TOWEL DISPOSABLE) ×2 IMPLANT
TOWEL OR 17X26 10 PK STRL BLUE (TOWEL DISPOSABLE) ×2 IMPLANT
WATER STERILE IRR 1000ML POUR (IV SOLUTION) ×2 IMPLANT

## 2012-03-29 NOTE — Progress Notes (Signed)
Right Femoral Catheter removed after order received from  Dr. Arbie Cookey.  Manual pressure applied until hemastasis achieved. Gauze dressing applied.

## 2012-03-29 NOTE — Anesthesia Procedure Notes (Signed)
Date/Time: 03/29/2012 1:35 PM Performed by: Sherie Don Pre-anesthesia Checklist: Patient identified, Emergency Drugs available, Suction available, Patient being monitored and Timeout performed Patient Re-evaluated:Patient Re-evaluated prior to inductionOxygen Delivery Method: Simple face mask Preoxygenation: Pre-oxygenation with 100% oxygen

## 2012-03-29 NOTE — Progress Notes (Signed)
DR. Hart Rochester CALLED TO GET K LEVEL AT 2:00 PM AND IF WITHIN ACCEPTABLE RANGE WILL D/C HD TX IN ORDER TO TAKE PT STRAIGHT FROM HD UNIT TO OR TO PLACE PERMANENT CATH. MARTY BERGMAN, PA AWARE AND OK WITH DISCONTINUATION OF TX AS SOON AS I-STAT DONE AND K LEVEL REVEALED.

## 2012-03-29 NOTE — Anesthesia Postprocedure Evaluation (Signed)
Anesthesia Post Note  Patient: Bryan Gilmore  Procedure(s) Performed: Procedure(s) (LRB): INSERTION OF DIALYSIS CATHETER (N/A)  Anesthesia type: MAC  Patient location: PACU  Post pain: Pain level controlled and Adequate analgesia  Post assessment: Post-op Vital signs reviewed, Patient's Cardiovascular Status Stable and Respiratory Function Stable  Last Vitals:  Filed Vitals:   03/29/12 1419  BP: 125/78  Pulse: 86  Temp: 37.1 C  Resp: 19    Post vital signs: Reviewed and stable  Level of consciousness: awake, alert  and oriented  Complications: No apparent anesthesia complications

## 2012-03-29 NOTE — Procedures (Signed)
Under sterile procedure a R femoral 3-lumen Trialysis temporary HD catheter was placed w US guidance and no complications. Pt tolerated procedure well.  Indication- need for HD access, PCath dislodged, and hyperkalemia w need for HD.    Vinson Moselle  MD [employer] [phone] pgr    [phone] cell 03/29/2012, 10:54 AM

## 2012-03-29 NOTE — Progress Notes (Addendum)
Pt arrived to HD unit in no distress. Confused but cooperative. Began to pull lines. Restrained with bilateral soft wrist restraints per order from Dr. Arlean Hopping. Hemodialysis initiated without complications. 1K bath for 1 hour and will switch to 2K bath for last 2 hours per MD order.  Dr. Arrie Aran paged about catheter issues at 2025. Venous port will not push/pull at all, venous pressures 450 at 150BFR . Pt received 1hr 36 minutes of HD. No fluid able to be pulled. Pt rinsed back without issue. Order received for TPA to be instilled into both cath lumens until pt can be ran again tomorrow am. Order for kayexalate placed as well per Dr. Arrie Aran. TPA instilled into both lumens per protocol. Pt returned to room in no distress with restraints in place as is still pulling at catheter and IV.

## 2012-03-29 NOTE — Op Note (Signed)
OPERATIVE REPORT  DATE OF SURGERY: 03/29/2012  PATIENT: Bryan Gilmore, 61 y.o. male MRN: [id number]  DOB: Aug 17, 1951  PRE-OPERATIVE DIAGNOSIS: End-stage renal disease  POST-OPERATIVE DIAGNOSIS:  Same  PROCEDURE: Right IJ catheter placement with ultrasound visualization  SURGEON:  Gretta Began, M.D.  PHYSICIAN ASSISTANT: None  ANESTHESIA:  1% lidocaine local  EBL: Minimal ml  Total I/O In: 50 [I.V.:50] Out: 97 [Other:97]  BLOOD ADMINISTERED: None  DRAINS: None none  SPECIMEN: None  COUNTS CORRECT:  YES  PLAN OF CARE: PACU with chest x-ray pending   PATIENT DISPOSITION:  PACU - hemodynamically stable  PROCEDURE DETAILS: Patient was taken to the operating placed supine position where the area the right and left neck were imaged with ultrasound revealing widely patent jugular veins bilaterally. Patient was placed in Trendelenburg position and using local anesthesia and a finder needle the right internal jugular vein was accessed. Next using an 18-gauge needle and the Seldinger technique a guidewire was passed to the level of the right atrium this was confirmed with fluoroscopy. A dilator and peel-away sheath was passed over the guidewire and the dilator and guidewire removed. A tunnel dialysis catheter was passed through the peel-away sheath which was removed. Catheter tips were placed the level of the distal right atrium. Catheter brought through subcutaneous tunnel through a heparin. The catheter secured to the skin with a 2-0 nylon stitch and the entry site was closed with a 4-0 subcuticular Vicryl stitch. Sterile dressing was applied and the patient was taken to the recovery room in stable conditiona separate stab incision. A 2 lm ports were attached in both lumens flushed and aspirated easily and were locked with 1000 unit per cc   Gretta Began, M.D. 03/29/2012 2:09 PM

## 2012-03-29 NOTE — Anesthesia Preprocedure Evaluation (Addendum)
Anesthesia Evaluation  Patient identified by MRN, date of birth, ID band Patient awake    Reviewed: Allergy & Precautions, H&P , NPO status , Patient's Chart, lab work & pertinent test results  Airway Mallampati: II  Neck ROM: full    Dental   Pulmonary          Cardiovascular hypertension,     Neuro/Psych    GI/Hepatic (+) Hepatitis -, C  Endo/Other    Renal/GU ESRF and DialysisRenal disease     Musculoskeletal   Abdominal   Peds  Hematology  (+) HIV,   Anesthesia Other Findings   Reproductive/Obstetrics                           Anesthesia Physical Anesthesia Plan  ASA: III  Anesthesia Plan: MAC   Post-op Pain Management:    Induction: Intravenous  Airway Management Planned: Simple Face Mask  Additional Equipment:   Intra-op Plan:   Post-operative Plan:   Informed Consent: I have reviewed the patients History and Physical, chart, labs and discussed the procedure including the risks, benefits and alternatives for the proposed anesthesia with the patient or authorized representative who has indicated his/her understanding and acceptance.     Plan Discussed with: CRNA and Surgeon  Anesthesia Plan Comments:         Anesthesia Quick Evaluation

## 2012-03-29 NOTE — Preoperative (Signed)
Beta Blockers   Reason not to administer Beta Blockers:Hold beta blocker due to other 

## 2012-03-29 NOTE — Progress Notes (Signed)
Pt arrived via stretcher from PACU. Pt is Alert to self only and confused but resting calmly and following commands. Pt has new R. IJ HD cath placed today dressing DD&I. Pt arrived with foley catheter. Pt has wound to scrotum where you can visibly see the bulb of the foley catheter. Pt has dry dressing where his right femoral cath was d/c'd. Pt was placed on telemetry due to potassium being 6.2 and MD order. Pt also has healing fissure to sacrum that's pink but intact. Mouth care has been done. Pt is on contact for positive MRSA. Will continue to monitor.

## 2012-03-29 NOTE — OR Nursing (Signed)
Pt has K of 6.8-surgery cancelled per Dr. Arbie Cookey.  Dialysis scheduled with Dr. Lewis Moccasin.  Report called to 6th floor dialysis unit.  Transported via shtrecher  On monitor to dialysis unit.  Clarice Pole, RN

## 2012-03-29 NOTE — Progress Notes (Signed)
Bard Herbert PA notified of potassium of 6.2 via I stat.  Orders received for CBC, BMET.

## 2012-03-29 NOTE — Progress Notes (Signed)
Dr. Ladene Artist notified of critical potassium confirmed from lab. No additional orders given.

## 2012-03-29 NOTE — Progress Notes (Signed)
PT BECAME AGITATED AT AROUND 1200, EXHIBITED BY PULLING MONITOR WIRES, COLOSTOMY BAG, AND BP CUFF. ATIVAN WAS ORDERED BY MARTY BERGMAN, PA. ADMINISTERD ATIVAN AND THEN PT PULLED HIS BRAND NEW RIGHT FEMORAL CATH WHICH MADE IT IMPOSSIBLE TO RINSED HIM BACK OR PUT HEPARIN IN BILATERAL HD CATH PORTS. PT HAD 2 HRS AND 40 MIN LEFT ON THE MACHINE. 20 MIN EARLIER OF THE INCIDENT I-STAT RESULTS SHOWED A K LEVEL OF 3.6. MARTY BERGMAN, PA AND DR. EARLY ALSO NOTIFIED. PT WENT STRAIGHT TO OR TO HAVE A PERMANENT HD CATH PLACED.

## 2012-03-29 NOTE — Transfer of Care (Signed)
Immediate Anesthesia Transfer of Care Note  Patient: Bryan Gilmore  Procedure(s) Performed: Procedure(s) (LRB): INSERTION OF DIALYSIS CATHETER (N/A)  Patient Location: PACU  Anesthesia Type: MAC  Level of Consciousness: sedated  Airway & Oxygen Therapy: Patient Spontanous Breathing and Patient connected to face mask oxygen  Post-op Assessment: Report given to PACU RN and Post -op Vital signs reviewed and stable  Post vital signs: Reviewed and stable  Complications: No apparent anesthesia complications

## 2012-03-29 NOTE — H&P (Signed)
Bryan Gilmore History and Physical  Cc: hyperkalemia in patient who pulled out his dialysis catheter  BJY:NWGNF Bryan Gilmore is a 61 y.o. male with ESRD (started during June 2013 admission )on HD who was discharged from [facility] (6/21 - 03/26/2012 admission) to [facility] where he was treated following sepsis secondary to  Fournier's gangrene s/p debridement. During the [facility] admission, he also had a Hartman procedure with end colostomy and umbilical hernia repair due to soilage of wound with incontinence and diarrhea.  Also during that admission he was seen by psychiatry, the details of which are not available in discharge summary except that he is on remeron, seroquel, klonopin and prn haldol and ativan.  When he presented to the [facility] dialysis center yesterday, he was confused and argumentative and unable to sign consents.  At some point, he pulled his dialysis catheter out and arrangements were made for replacement today.  Initial istat was 6.8 with serum confirmation of the same.  His tunneled catheter placement was post-poned.  He was admitted for emergent hemodialysis and correction of hypokalemia and placement of tunneled catheter.  He was NOT yet signed admission paper at his HD center therefore cannot be dialyzed there Saturday.  The patient needs to be accompanied by a family member. His discharge summary form [facility] states he is a DNR/DNI.  Past Medical History  Diagnosis Date  . Hypertension   . HIV (human immunodeficiency virus infection)   . Sepsis   . Gout   . Hepatitis C   . Fournier's gangrene   . Anemia   . Severe protein-calorie malnutrition   . End stage renal disease    Past Surgical History  Procedure Date  . Cystoscopy with urethral dilatation 02/17/2012    Procedure: CYSTOSCOPY WITH URETHRAL DILATATION;  Surgeon: Bryan Sinner, MD;  Location: [facility] ORS;  Service: Urology;  Laterality: N/A;  retrograde urethragram, placement of foley catheter exam  under anesthesia   . Insertion of dialysis catheter 02/20/2012    Procedure: INSERTION OF DIALYSIS CATHETER;  Surgeon: Bryan Hint, MD;  Location: [facility] OR;  Service: Vascular;  Laterality: N/A;  . Irrigation and debridement abscess 02/29/2012    Procedure: IRRIGATION AND DEBRIDEMENT ABSCESS;  Surgeon: Bryan Sinner, MD;  Location: [facility] OR;  Service: Urology;  Laterality: N/A;  I&D of scrotum abcess   Family History  Problem Relation Age of Onset  . Hypertension Mother   . Hypertension Father    Social History: Unable to obtain reliable history.  reports that he has never smoked. He has never used smokeless tobacco. He reports that he drinks alcohol. He reports that he does not use illicit drugs. No Known Allergies Prior to Admission medications   Medication Sig Start Date End Date Taking? Authorizing Provider  acetaminophen (TYLENOL) 325 MG tablet Take 650 mg by mouth every 4 (four) hours as needed. For fever beyond 24 hours.   Yes Historical Provider, MD  allopurinol (ZYLOPRIM) 300 MG tablet Take 300 mg by mouth daily.   Yes Historical Provider, MD  calcium acetate (PHOSLO) 667 MG capsule Take 2 capsules (1,334 mg total) by mouth 3 (three) times daily with meals. 03/01/12 03/01/13 Yes Bryan [name], MD  clonazePAM (KLONOPIN) 0.5 MG tablet Take 0.5 mg by mouth every 12 (twelve) hours.   Yes Historical Provider, MD  emtricitabine-tenofovir (TRUVADA) 200-300 MG per tablet Take 1 tablet by mouth every Monday, Wednesday, and Friday.     Yes Historical Provider, MD  lamivudine (EPIVIR)  100 MG tablet Take 50 mg by mouth daily.   Yes Historical Provider, MD  lopinavir-ritonavir [place]) 400-100 MG/5ML solution Take 5 mLs (400 mg total) by mouth 2 (two) times daily with a meal. 03/01/12 03/01/13 Yes Bryan Ogle, MD  mirtazapine (REMERON) 15 MG tablet Take 15 mg by mouth at bedtime.   Yes Historical Provider, MD  Multiple Vitamin (THERA PO) Take 1 tablet by mouth daily.   Yes Historical  Provider, MD  Nebivolol HCl (BYSTOLIC) 20 MG TABS Take 20 mg by mouth daily.   Yes Historical Provider, MD  Nutritional Supplements (FEEDING SUPPLEMENT, NEPRO CARB STEADY,) LIQD Take 237 mLs by mouth 2 (two) times daily.   Yes Historical Provider, MD  pantoprazole (PROTONIX) 40 MG tablet Take 40 mg by mouth daily.   Yes Historical Provider, MD  potassium chloride SA (K-DUR,KLOR-CON) 20 MEQ tablet Take 2 tablets (40 mEq total) by mouth 2 (two) times daily. 03/01/12 03/01/13 Yes Bryan [name], MD  QUEtiapine (SEROQUEL) 100 MG tablet Take 100 mg by mouth 3 (three) times daily.   Yes Historical Provider, MD  QUEtiapine (SEROQUEL) 50 MG tablet Take 50 mg by mouth every 8 (eight) hours as needed. For depressive disorder.   Yes Historical Provider, MD  raltegravir (ISENTRESS) 400 MG tablet Take 400 mg by mouth 2 (two) times daily.     Yes Historical Provider, MD  zidovudine (RETROVIR) 100 MG capsule Take 300 mg by mouth daily.   Yes Historical Provider, MD   Results for orders placed during the hospital encounter of 03/29/12 (from the past 48 hour(s))  SURGICAL PCR SCREEN     Status: Abnormal   Collection Time   03/29/12  7:45 AM      Component Value Range Comment   MRSA, PCR POSITIVE (*) NEGATIVE    Staphylococcus aureus POSITIVE (*) NEGATIVE   POCT I-STAT 4, (NA,K, GLUC, HGB,HCT)     Status: Abnormal   Collection Time   03/29/12  8:01 AM      Component Value Range Comment   Sodium 142  135 - 145 mEq/L    Potassium 6.8 (*) 3.5 - 5.1 mEq/L    Glucose, Bld 96  70 - 99 mg/dL    HCT 09.8 (*) 11.9 - 52.0 %    Hemoglobin 9.9 (*) 13.0 - 17.0 g/dL    Comment NOTIFIED PHYSICIAN     POTASSIUM     Status: Abnormal   Collection Time   03/29/12  8:05 AM      Component Value Range Comment   Potassium 6.8 (*) 3.5 - 5.1 mEq/L     ROS: Unable to obtain due to argumentative state and also very difficult to understand Physical Exam: Filed Vitals:   03/29/12 1119  BP: 117/66  Pulse: 80  Temp:   Resp: 26      General: Well developed, slender,agitated and argumentative Head: Normocephalic, atraumatic, sclera non-icteric, mucus membranes are moist Neck: Supple. JVD not elevated. Lungs: Clear bilaterally to auscultation without wheezes, rales, or rhonchi. Breathing is unlabored. Heart: RRR with S1 S2. No murmurs, rubs, or gallops appreciated. Abdomen: Soft, non-tender,colostomy in tact; midline wound looks clean w staples in place and no signs of infection, healing well M-S:  Strength and tone appear normal for age. Lower extremities:without edema or ischemic changes, no open wounds  Neuro/Psych: difficult to evaluate his true level of orientation due to argumentative/agitated state  Moves all extremities spontaneously. Dialysis Access: temporary right fem cath triple lumen  HD status -  d/c from [facility], but has not signed papers yet at [place]  Assessment/Plan: 1. Hyperkalemia/ ESRD -  S/p placment of temporary catheter by Bryan Gilmore for emergent dialysis; patient has now pulled off colostomy bag; he was given 1 mg IV ativan, but subsequently pulled out temporary catheter about 2 inches before wrist restraints could be placed.  He was rinsed back. He had 1 hour 1 K bath and 20 min 2K bath after the first istat at 1 hour was 3.6. Plan HD in am. Recheck K post op - He was on KCl (40 bid) for some unknown reason at his nursing home - this was not on his discharge summary. 2. Hypertension/volume  - ok - received bystolic this am; dry weight unknown. 3. Anemia  - check CBC post HD today - was not initially checked because it was thought this would be a simple dialysis.  The last CBC I see is from 9.5 from 7/10; not on Aranesp on d/c med list.  Will start pending Hgb results and also check Fe studies. 4. Metabolic bone disease -  Check labs; again, NH meds did not match discharge med list, will start with 2 phoslo ac and re - evaluate; on 1 mcg po zemplar daily.  iPTH in June was 900s; change to 2 mcg IV with  HD. 5. Nutrition - renal diet 6. Psych - continue usual meds - on d/c summary; I don't think he is competent to sign dialysis paper; will need family member to accompany him; need records regarding psych issues from [facility] as well. The NH had him on 100 seroquel tid with 50 tid prn; I have changed it to 50 tid seroquel which is what was on his discharge summary and the 7/10 med list from [facility].  ? Infection component, will get blood and urine cultures. 7. HIV - I have reviewed NH and [facility] HIV meds and they are not the same.  Will hold all HIV meds for now.  May need ID input. Called ID, Dr. Orvan Falconer, they will see him on Monday re: HIV meds, if needed call over W/E.  8. D/C planning -  SW consulted- need to determine if he is going to be appropriate for outpatient dialysis and see if [facility] accept him [facility].  He is unable to be discharged until we determine competency to sign papers for outpatient dialysis or find a family member to accompany him.  He needs to demonstrate safe behaviors on dialysis before he can be discharged. 9. MRSA + contact precautions - bactroban   Bryan Slider, PA-C [facility] [phone] 03/29/2012, 11:28 AM   Addendum:  Spoke with his sister Bryan Gilmore - Bryan Gilmore.  Prior to June admission, he worked doing Chief Operating Officer at [employer].  She saw him last week at [facility] and said he told her that he had wires growing out of his chest.  She was not aware he was on any antipsychotic drugs.  At baseline is competent and able to make decisions.  She has not yet seen him yet today and is waiting for him to get out of surgery.  Bryan Bane, PA-C 2:10 pm  Patient seen and examined and agree with assessment and plan as above with additions as indicated. Mr. Liendo has a perineal wound which seems to be healing, a recently placed colostomy, a dislodged HD cath and agitation/confusion which is complicating his care. He was d/c'd home from [facility] on 7/16 but is  in no shape  for outpatient dialysis mentally.  Will need further investigation. For now dialysis access is the pressing issue- temp HD cath placed for high K+ and acute HD has been done, [facility] is placing tunneled HD catheter. Full admit, need to work out psych/behavior issues, get Wound Care to see regarding wounds. Not on abx at this point that I can see. Discussed with ID, his last clinic appearance was 2012, could not get meds due to insurance issues.  Will hold HIV meds for now, they will see him on Monday.    Bryan Moselle  MD [employer] [phone] pgr    [phone] cell 03/29/2012, 2:42 PM

## 2012-03-29 NOTE — Progress Notes (Signed)
Patient ID: Bryan Gilmore, male   DOB: Apr 26, 1951, 61 y.o.   MRN: [id number] The patient presents today for a dialysis catheter placement. He is confused and has poor understanding of this. I spoke with his sister by telephone who is the power of attorney. He apparently pulled his catheter out that his nursing facility. I discussed the procedure including potential risk of bleeding infection and pneumothorax. The patient's reoperative lab work revealed a potassium of 6.8. I discussed this with Dr.Coladonada with the renal service who will arrange for treatment of his hyperkalemia. We will be available for tunneled catheter placement once his potassium is in a safe range. He does not have any EKG changes suggesting images from hyperkalemia

## 2012-03-29 NOTE — Progress Notes (Signed)
Dr. Ladene Artist notified of potassium have ordered potassium for lab to verify.

## 2012-03-30 ENCOUNTER — Inpatient Hospital Stay (HOSPITAL_COMMUNITY): Payer: BC Managed Care – PPO

## 2012-03-30 DIAGNOSIS — F1020 Alcohol dependence, uncomplicated: Secondary | ICD-10-CM

## 2012-03-30 DIAGNOSIS — F09 Unspecified mental disorder due to known physiological condition: Secondary | ICD-10-CM

## 2012-03-30 DIAGNOSIS — R404 Transient alteration of awareness: Secondary | ICD-10-CM

## 2012-03-30 LAB — FERRITIN: Ferritin: 1434 ng/mL — ABNORMAL HIGH (ref 22–322)

## 2012-03-30 LAB — RENAL FUNCTION PANEL
Albumin: 2.4 g/dL — ABNORMAL LOW (ref 3.5–5.2)
BUN: 35 mg/dL — ABNORMAL HIGH (ref 6–23)
CO2: 24 mEq/L (ref 19–32)
CO2: 25 mEq/L (ref 19–32)
Calcium: 9.8 mg/dL (ref 8.4–10.5)
Chloride: 102 mEq/L (ref 96–112)
Creatinine, Ser: 6.81 mg/dL — ABNORMAL HIGH (ref 0.50–1.35)
GFR calc Af Amer: 9 mL/min — ABNORMAL LOW (ref >90)
GFR calc Af Amer: 9 mL/min — ABNORMAL LOW (ref >90)
GFR calc non Af Amer: 8 mL/min — ABNORMAL LOW (ref >90)
Glucose, Bld: 85 mg/dL (ref 70–99)
Glucose, Bld: 82 mg/dL (ref 70–99)
Phosphorus: 5.9 mg/dL — ABNORMAL HIGH (ref 2.3–4.6)
Potassium: 5.1 mEq/L (ref 3.5–5.1)
Potassium: 4.9 mEq/L (ref 3.5–5.1)
Sodium: 144 mEq/L (ref 135–145)
Sodium: 145 mEq/L (ref 135–145)

## 2012-03-30 LAB — CBC
HCT: 26.6 % — ABNORMAL LOW (ref 39.0–52.0)
Hemoglobin: 8.9 g/dL — ABNORMAL LOW (ref 13.0–17.0)
Hemoglobin: 8.5 g/dL — ABNORMAL LOW (ref 13.0–17.0)
MCH: 31.3 pg (ref 26.0–34.0)
MCH: 31.5 pg (ref 26.0–34.0)
MCHC: 32 g/dL (ref 30.0–36.0)
MCV: 98.5 fL (ref 78.0–100.0)
Platelets: 419 10*3/uL — ABNORMAL HIGH (ref 150–400)
RBC: 2.84 MIL/uL — ABNORMAL LOW (ref 4.22–5.81)
RBC: 2.7 MIL/uL — ABNORMAL LOW (ref 4.22–5.81)
RDW: 19.8 % — ABNORMAL HIGH (ref 11.5–15.5)
WBC: 11.4 10*3/uL — ABNORMAL HIGH (ref 4.0–10.5)

## 2012-03-30 LAB — AMMONIA: Ammonia: 15 umol/L (ref 11–60)

## 2012-03-30 LAB — IRON AND TIBC
Iron: 20 ug/dL — ABNORMAL LOW (ref 42–135)
Saturation Ratios: 8 % — ABNORMAL LOW (ref 20–55)
TIBC: 255 ug/dL (ref 215–435)
UIBC: 235 ug/dL (ref 125–400)

## 2012-03-30 MED ORDER — VANCOMYCIN HCL IN DEXTROSE 1-5 GM/200ML-% IV SOLN
1000.0000 mg | INTRAVENOUS | Status: DC
  Administered 2012-04-02: 1000 mg via INTRAVENOUS
  Filled 2012-03-30: qty 200

## 2012-03-30 MED ORDER — DARBEPOETIN ALFA-POLYSORBATE 100 MCG/0.5ML IJ SOLN
100.0000 ug | INTRAMUSCULAR | Status: DC
  Administered 2012-03-30 – 2012-04-13 (×3): 100 ug via INTRAVENOUS
  Filled 2012-03-30 (×3): qty 0.5

## 2012-03-30 MED ORDER — NEBIVOLOL HCL 10 MG PO TABS
20.0000 mg | ORAL_TABLET | Freq: Every day | ORAL | Status: DC
  Administered 2012-03-31 – 2012-04-12 (×14): 20 mg via ORAL
  Filled 2012-03-30 (×15): qty 2

## 2012-03-30 MED ORDER — DEXTROSE 5 % IV SOLN
2.0000 g | INTRAVENOUS | Status: DC
  Administered 2012-03-30 – 2012-04-02 (×2): 2 g via INTRAVENOUS
  Filled 2012-03-30 (×3): qty 2

## 2012-03-30 MED ORDER — CHLORHEXIDINE GLUCONATE CLOTH 2 % EX PADS
6.0000 | MEDICATED_PAD | Freq: Every day | CUTANEOUS | Status: AC
  Administered 2012-03-30 – 2012-04-03 (×5): 6 via TOPICAL

## 2012-03-30 MED ORDER — FLUCONAZOLE 100 MG PO TABS
100.0000 mg | ORAL_TABLET | Freq: Every day | ORAL | Status: DC
  Administered 2012-03-30 – 2012-04-03 (×5): 100 mg via ORAL
  Filled 2012-03-30 (×5): qty 1

## 2012-03-30 MED ORDER — HEPARIN SODIUM (PORCINE) 1000 UNIT/ML IJ SOLN
1500.0000 [IU] | Freq: Once | INTRAMUSCULAR | Status: AC
  Administered 2012-03-30: 1500 [IU] via INTRAVENOUS

## 2012-03-30 MED ORDER — VANCOMYCIN HCL 1000 MG IV SOLR
2000.0000 mg | Freq: Once | INTRAVENOUS | Status: AC
  Administered 2012-03-30: 2000 mg via INTRAVENOUS
  Filled 2012-03-30: qty 2000

## 2012-03-30 NOTE — Progress Notes (Signed)
Pt off unit

## 2012-03-30 NOTE — Evaluation (Signed)
Clinical/Bedside Swallow Evaluation Patient Details  Name: Bryan Gilmore MRN: [id number] Date of Birth: December 14, 1950  Today's Date: 03/30/2012 Time: 0865-7846 SLP Time Calculation (min): 45 min  Past Medical History:  Past Medical History  Diagnosis Date  . Hypertension   . HIV (human immunodeficiency virus infection)   . Sepsis   . Gout   . Hepatitis C   . Fournier's gangrene   . Anemia   . Severe protein-calorie malnutrition   . End stage renal disease    Past Surgical History:  Past Surgical History  Procedure Date  . Cystoscopy with urethral dilatation 02/17/2012    Procedure: CYSTOSCOPY WITH URETHRAL DILATATION;  Surgeon: Martina Sinner, MD;  Location: [facility] ORS;  Service: Urology;  Laterality: N/A;  retrograde urethragram, placement of foley catheter exam under anesthesia   . Insertion of dialysis catheter 02/20/2012    Procedure: INSERTION OF DIALYSIS CATHETER;  Surgeon: Chuck Hint, MD;  Location: [facility] OR;  Service: Vascular;  Laterality: N/A;  . Irrigation and debridement abscess 02/29/2012    Procedure: IRRIGATION AND DEBRIDEMENT ABSCESS;  Surgeon: Martina Sinner, MD;  Location: [facility] OR;  Service: Urology;  Laterality: N/A;  I&D of scrotum abcess   HPI:  Bryan Gilmore is a 61 y.o. male with ESRD (started during June 2013 admission )on HD who was discharged from [facility] (6/21 - 03/26/2012 admission) to [facility] where he was treated following sepsis secondary to  Fournier's gangrene s/p debridement. During the [facility] admission, he also had a Hartman procedure with end colostomy and umbilical hernia repair due to soilage of wound with incontinence and diarrhea.  Also during that admission he was seen by psychiatry, the details of which are not available in discharge summary except that he is on remeron, seroquel, klonopin and prn haldol and ativan.  When he presented to the [facility] dialysis center yesterday, he was confused and argumentative and unable to sign  consents.  At some point, he pulled his dialysis catheter out and arrangements were made for replacement today.  Initial istat was 6.8 with serum confirmation of the same.  His tunneled catheter placement was post-poned.  He was admitted for emergent hemodialysis and correction of hypokalemia and placement of tunneled catheter.  His discharge summary form [facility] states he is a DNR/DNI. Patient referred for BSE to assess risk for aspiration and recommend safest, PO diet.   Assessment / Plan / Recommendation Clinical Impression  Minimal oral and pharyngeal phase dysphagia, Oral phase affected by a combination of weakness, decreased LOA, and poor condition of natural dentition.  Pharyngeal phase marked by slight delay with patient reporting difficulty clearing thin liquid when administered by straw.   No outward +s/s of aspiration noted during evaluation but due to patient presenting with lethargy  recommend to proceed with dysphagia 3 diet consistency and thin liquids with full supervision with all meals. ST to follow in acute care setting for diet tolerance and possible diet advancement.     Aspiration Risk  Moderate    Diet Recommendation Dysphagia 3 (Mechanical Soft);Thin liquid   Liquid Administration via: Cup;No straw Medication Administration: Whole meds with puree Supervision: Full supervision/cueing for compensatory strategies Compensations: Slow rate;Small sips/bites;Check for pocketing;Follow solids with liquid Postural Changes and/or Swallow Maneuvers: Seated upright 90 degrees;Upright 30-60 min after meal    Other  Recommendations Oral Care Recommendations: Oral care before and after PO Other Recommendations: Clarify dietary restrictions   Follow Up Recommendations  Skilled Nursing facility  Frequency and Duration min 2x/week  2 weeks       SLP Swallow Goals Patient will consume recommended diet without observed clinical signs of aspiration with: Minimal assistance Patient  will utilize recommended strategies during swallow to increase swallowing safety with: Minimal assistance   Swallow Study Prior Functional Status   Regular consistency /thin liquids as noted from prior medical report from 03/26/12 in hard chart    General Date of Onset: 03/28/12 HPI: Bryan Gilmore is a 61 y.o. male with ESRD (started during June 2013 admission )on HD who was discharged from [facility] (6/21 - 03/26/2012 admission) to [facility] where he was treated following sepsis secondary to  Fournier's gangrene s/p debridement. During the [facility] admission, he also had a Hartman procedure with end colostomy and umbilical hernia repair due to soilage of wound with incontinence and diarrhea.  Also during that admission he was seen by psychiatry, the details of which are not available in discharge summary except that he is on remeron, seroquel, klonopin and prn haldol and ativan.  When he presented to the [facility] dialysis center yesterday, he was confused and argumentative and unable to sign consents.  At some point, he pulled his dialysis catheter out and arrangements were made for replacement today.  Initial istat was 6.8 with serum confirmation of the same.  His tunneled catheter placement was post-poned.  He was admitted for emergent hemodialysis and correction of hypokalemia and placement of tunneled catheter.  His discharge summary form [facility] states he is a DNR/DNI. Type of Study: Bedside swallow evaluation Previous Swallow Assessment: no prior reports of dysphagia or prior swallow evaluations  Diet Prior to this Study: NPO Temperature Spikes Noted: No Respiratory Status: Room air Behavior/Cognition: Cooperative;Pleasant mood;Confused;Lethargic;Requires cueing Oral Cavity - Dentition: Missing dentition;Poor condition Self-Feeding Abilities: Total assist Patient Positioning: Upright in bed Baseline Vocal Quality: Clear Volitional Cough: Strong Volitional Swallow: Able to elicit      Oral/Motor/Sensory Function Overall Oral Motor/Sensory Function: Appears within functional limits for tasks assessed   Ice Chips Ice chips: Within functional limits Presentation: Spoon   Thin Liquid Thin Liquid: Impaired Presentation: Cup;Straw Pharyngeal  Phase Impairments: Suspected delayed Swallow Other Comments: Patient reports increased difficulty "clearing" thin liquid when administered by straw    Nectar Thick Nectar Thick Liquid: Not tested   Honey Thick Honey Thick Liquid: Not tested   Puree Puree: Within functional limits Presentation: Spoon   Solid Solid: Impaired Oral Phase Impairments: Impaired anterior to posterior transit;Reduced lingual movement/coordination;Poor awareness of bolus Oral Phase Functional Implications: Oral residue Pharyngeal Phase Impairments: Suspected delayed Swallow;Multiple swallows   Moreen Fowler MS, CCC-SLP [phone] [facility] 03/30/2012,5:52 PM

## 2012-03-30 NOTE — Progress Notes (Signed)
Patient scrotal wound - 95% granulated, 5% yellow slough. Foley catheter tip exposed posterior to scrotum. Moderate amount of purulent drainage noted. Foley catheter removed.

## 2012-03-30 NOTE — Procedures (Signed)
I was present at this dialysis session. I have reviewed the session itself and made appropriate changes.   Vinson Moselle, MD [employer] 03/30/2012, 10:30 AM

## 2012-03-30 NOTE — Progress Notes (Signed)
ANTIBIOTIC CONSULT NOTE - INITIAL  Pharmacy Consult: Vancomycin Indication:  Empiric for leukocytosis, ?UTI  No Known Allergies  Patient Measurements: Weight: 187 lb 13.3 oz (85.2 kg)  Vital Signs: Temp: 97.7 F (36.5 C) (07/20 0640) Temp src: Oral (07/20 0640) BP: 119/66 mmHg (07/20 0830) Pulse Rate: 124  (07/20 0830) Intake/Output from previous day: 07/19 0701 - 07/20 0700 In: 50 [I.V.:50] Out: 96   Labs:  Basename 03/30/12 0823 03/30/12 0600 03/29/12 1625  WBC 11.4* 11.6* --  HGB 8.5* 8.9* 9.2*  PLT 419* 408* --  LABCREA -- -- --  CREATININE -- 6.72* --   The CrCl is unknown because both a height and weight (above a minimum accepted value) are required for this calculation. No results found for this basename: VANCOTROUGH:2,VANCOPEAK:2,VANCORANDOM:2,GENTTROUGH:2,GENTPEAK:2,GENTRANDOM:2,TOBRATROUGH:2,TOBRAPEAK:2,TOBRARND:2,AMIKACINPEAK:2,AMIKACINTROU:2,AMIKACIN:2, in the last 72 hours   Microbiology: Recent Results (from the past 720 hour(s))  SURGICAL PCR SCREEN     Status: Abnormal   Collection Time   03/29/12  7:45 AM      Component Value Range Status Comment   MRSA, PCR POSITIVE (*) NEGATIVE Final    Staphylococcus aureus POSITIVE (*) NEGATIVE Final     Medical History: Past Medical History  Diagnosis Date  . Hypertension   . HIV (human immunodeficiency virus infection)   . Sepsis   . Gout   . Hepatitis C   . Fournier's gangrene   . Anemia   . Severe protein-calorie malnutrition   . End stage renal disease        Assessment: 88 YOM with history of HIV, sepsis secondary to Fournier's gangrene, and perineal wound.  Patient admitted on 03/29/12 for hyperkalemia and loss of HD access.  Now s/p IJ HD catheter placement and patient to resume HD sessions.  Pharmacy consulted to manage vancomycin for leukocytosis and possible UTI.  He is to start [place] as well.  Vanc 7/20 >> Elita Quick 7/20 >>  7/19 blood cx - pending  +HIV - CD4 180 on 6/8, meds on hold  for now, ID to see patient on Monday    Goal of Therapy:  Vanc pre-HD level 15-25 mcg/mL    Plan:  - Vanc 2mg  IV x 1 today after HD per MD order, then 1gm IV qHD - Monitor time on HD for adjustment in dosage - F/U resume HIV meds    Seva Chancy D. Laney Potash, PharmD, BCPS Pager:  [phone] 03/30/2012, 9:10 AM

## 2012-03-30 NOTE — Progress Notes (Signed)
SLP Cancellation Note  Treatment cancelled today due to patient receiving procedure or test.  Pt remains in HD.  Will reattempt later.  Thanks.  Donavan Burnet, MS [facility] SLP [phone]

## 2012-03-30 NOTE — Consult Note (Signed)
Reason for Consult: AMS Referring Physician: unknown  Bryan Gilmore is an 61 y.o. male.  HPI:  Bryan Gilmore is a 61 y.o. male with ESRD (started during June 2013 admission ) on HD who was discharged from [facility] (6/21 - 03/26/2012 admission) to [facility] where he was treated following sepsis secondary to Fournier's gangrene s/p debridement. During the [facility] admission, he also had a Hartman procedure with end colostomy and umbilical hernia repair due to soilage of wound with incontinence and diarrhea. Around that time he was seen by psychiatry,  he was on remeron, seroquel, klonopin and prn haldol and ativan.    When he presented to the [facility] dialysis center yesterday, he was confused and argumentative and unable to sign consents. At some point, he pulled his dialysis catheter out and arrangements were made for replacement today. Initial istat was 6.8 with serum confirmation of the same.   Per family he is very confused now but was better when he left here after discharge. Poor historian. Chronic drinker very heavy per family. Not sure where he is and could not tell the name of his daughter and other family on multiple efforts. Not sure where he is right now and about his medical issues.  Past Medical History  Diagnosis Date  . Hypertension   . HIV (human immunodeficiency virus infection)   . Sepsis   . Gout   . Hepatitis C   . Fournier's gangrene   . Anemia   . Severe protein-calorie malnutrition   . End stage renal disease     Past Surgical History  Procedure Date  . Cystoscopy with urethral dilatation 02/17/2012    Procedure: CYSTOSCOPY WITH URETHRAL DILATATION;  Surgeon: Martina Sinner, MD;  Location: [facility];  Service: Urology;  Laterality: N/A;  retrograde urethragram, placement of foley catheter exam under anesthesia   . Insertion of dialysis catheter 02/20/2012    Procedure: INSERTION OF DIALYSIS CATHETER;  Surgeon: Chuck Hint, MD;  Location: [facility] OR;   Service: Vascular;  Laterality: N/A;  . Irrigation and debridement abscess 02/29/2012    Procedure: IRRIGATION AND DEBRIDEMENT ABSCESS;  Surgeon: Martina Sinner, MD;  Location: [facility] OR;  Service: Urology;  Laterality: N/A;  I&D of scrotum abcess    Family History  Problem Relation Age of Onset  . Hypertension Mother   . Hypertension Father     Social History:  reports that he has never smoked. He has never used smokeless tobacco. He reports that he drinks alcohol. He reports that he does not use illicit drugs.  Allergies: No Known Allergies  Medications: I have reviewed the patient's current medications.  Results for orders placed during the hospital encounter of 03/29/12 (from the past 48 hour(s))  SURGICAL PCR SCREEN     Status: Abnormal   Collection Time   03/29/12  7:45 AM      Component Value Range Comment   MRSA, PCR POSITIVE (*) NEGATIVE    Staphylococcus aureus POSITIVE (*) NEGATIVE   POCT I-STAT 4, (NA,K, GLUC, HGB,HCT)     Status: Abnormal   Collection Time   03/29/12  8:01 AM      Component Value Range Comment   Sodium 142  135 - 145 mEq/L    Potassium 6.8 (*) 3.5 - 5.1 mEq/L    Glucose, Bld 96  70 - 99 mg/dL    HCT 78.4 (*) 69.6 - 52.0 %    Hemoglobin 9.9 (*) 13.0 - 17.0 g/dL  Comment NOTIFIED PHYSICIAN     POTASSIUM     Status: Abnormal   Collection Time   03/29/12  8:05 AM      Component Value Range Comment   Potassium 6.8 (*) 3.5 - 5.1 mEq/L   POCT I-STAT 4, (NA,K, GLUC, HGB,HCT)     Status: Abnormal   Collection Time   03/29/12  4:25 PM      Component Value Range Comment   Sodium 145  135 - 145 mEq/L    Potassium 6.2 (*) 3.5 - 5.1 mEq/L    Glucose, Bld 88  70 - 99 mg/dL    HCT 13.2 (*) 44.0 - 52.0 %    Hemoglobin 9.2 (*) 13.0 - 17.0 g/dL    Comment MD NOTIFIED, REPEAT TEST     CBC     Status: Abnormal   Collection Time   03/30/12  6:00 AM      Component Value Range Comment   WBC 11.6 (*) 4.0 - 10.5 K/uL    RBC 2.84 (*) 4.22 - 5.81 MIL/uL     Hemoglobin 8.9 (*) 13.0 - 17.0 g/dL    HCT 10.2 (*) 72.5 - 52.0 %    MCV 100.4 (*) 78.0 - 100.0 fL    MCH 31.3  26.0 - 34.0 pg    MCHC 31.2  30.0 - 36.0 g/dL    RDW 36.6 (*) 44.0 - 15.5 %    Platelets 408 (*) 150 - 400 K/uL   RENAL FUNCTION PANEL     Status: Abnormal   Collection Time   03/30/12  6:00 AM      Component Value Range Comment   Sodium 144  135 - 145 mEq/L    Potassium 5.1  3.5 - 5.1 mEq/L    Chloride 102  96 - 112 mEq/L    CO2 24  19 - 32 mEq/L    Glucose, Bld 85  70 - 99 mg/dL    BUN 35 (*) 6 - 23 mg/dL    Creatinine, Ser 3.47 (*) 0.50 - 1.35 mg/dL    Calcium 42.5  8.4 - 10.5 mg/dL    Phosphorus 5.7 (*) 2.3 - 4.6 mg/dL    Albumin 2.3 (*) 3.5 - 5.2 g/dL    GFR calc non Af Amer 8 (*) >90 mL/min    GFR calc Af Amer 9 (*) >90 mL/min   CBC     Status: Abnormal   Collection Time   03/30/12  8:23 AM      Component Value Range Comment   WBC 11.4 (*) 4.0 - 10.5 K/uL    RBC 2.70 (*) 4.22 - 5.81 MIL/uL    Hemoglobin 8.5 (*) 13.0 - 17.0 g/dL    HCT 95.6 (*) 38.7 - 52.0 %    MCV 98.5  78.0 - 100.0 fL    MCH 31.5  26.0 - 34.0 pg    MCHC 32.0  30.0 - 36.0 g/dL    RDW 56.4 (*) 33.2 - 15.5 %    Platelets 419 (*) 150 - 400 K/uL   RENAL FUNCTION PANEL     Status: Abnormal   Collection Time   03/30/12  8:23 AM      Component Value Range Comment   Sodium 145  135 - 145 mEq/L    Potassium 4.9  3.5 - 5.1 mEq/L    Chloride 102  96 - 112 mEq/L    CO2 25  19 - 32 mEq/L    Glucose, Bld 82  70 - 99 mg/dL    BUN 35 (*) 6 - 23 mg/dL    Creatinine, Ser 1.61 (*) 0.50 - 1.35 mg/dL    Calcium 9.8  8.4 - 09.6 mg/dL    Phosphorus 5.9 (*) 2.3 - 4.6 mg/dL    Albumin 2.4 (*) 3.5 - 5.2 g/dL    GFR calc non Af Amer 8 (*) >90 mL/min    GFR calc Af Amer 9 (*) >90 mL/min   IRON AND TIBC     Status: Abnormal   Collection Time   03/30/12  8:23 AM      Component Value Range Comment   Iron 20 (*) 42 - 135 ug/dL    TIBC 045  409 - 811 ug/dL    Saturation Ratios 8 (*) 20 - 55 %    UIBC 235  125 -  400 ug/dL   AMMONIA     Status: Normal   Collection Time   03/30/12  9:28 AM      Component Value Range Comment   Ammonia 15  11 - 60 umol/L     Dg Chest 1 View  03/29/2012  *RADIOLOGY REPORT*  Clinical Data: Catheter pulled out.  Insertion of dialysis catheter.  CHEST - 1 VIEW  Comparison: 02/22/2012  Findings: Previously seen right-sided dual lumen catheter is no longer present.  The heart is mildly enlarged.  The aorta is unfolded.  Lungs are clear.  The vascularity is normal.  No effusions.  No pneumothorax.  No bony abnormality.  IMPRESSION: No active disease.  Previously seen right dual lumen catheter now absent.  Original Report Authenticated By: Thomasenia Sales, M.D.   Dg Chest Port 1 View  03/29/2012  *RADIOLOGY REPORT*  Clinical Data: Post right IJ dialysis catheter placement  PORTABLE CHEST - 1 VIEW  Comparison: 03/29/2012; 02/22/2012  Findings: Grossly unchanged borderline enlarged cardiac silhouette and mediastinal contours.  Atherosclerotic calcifications within the aortic arch.  Interval placement of a right jugular approach dialysis catheter with tips overlying the superior aspect of the right atrium.  No pneumothorax or pleural effusion.  Unchanged bones.  IMPRESSION: Interval placement of right jugular approach dialysis catheter with tips overlying the superior aspect of the right atrium.  Original Report Authenticated By: Waynard Reeds, M.D.   Dg Fluoro Guide Cv Line-no Report  03/29/2012  CLINICAL DATA: dialysis   FLOURO GUIDE CV LINE  Fluoroscopy was utilized by the requesting physician.  No radiographic  interpretation.      ROS Blood pressure 99/50, pulse 98, temperature 99.2 F (37.3 C), temperature source Oral, resp. rate 17, weight 87.4 kg (192 lb 10.9 oz), SpO2 100.00%. Physical Exam  Alert on bed   Mental Status Examination/Evaluation:  Appearance: on bed confused  Eye Contact:: poor  Speech: limited  Volume: low  Mood: no answer  Affect: ristricted    Thought Process: disorganized  Orientation: 0/4  Thought Content: denies AVH  Suicidal Thoughts: No  Homicidal Thoughts: no  Memory: Recent; Poor  Judgement: Impaired  Insight: Lacking  Psychomotor Activity: slow  Concentration: poor  Recall: poor  Akathisia: No    Assessment:   AXIS I: Delirium d/o NOS, Alcohol Dep, cognitive d/o nos, r/o dementia  AXIS II: Deferred  AXIS III: see emdical hx  ?   AXIS IV: problems related to social environment, problems with access to health care services and problems with primary support group  AXIS V: 15  ?    Treatment Plan/Recommendations:   - Kennon Portela  delirium on the top on the top of significant cognitive deficits.  - will recommend out pt psy f/u after discharge including dementia work up as out pt  - will benefit from SNF  - consider increasing/changing Seroquel to XR form 100 mg QHS for agiatation if needed. Also consider folate and thiamine . Remeron can be continued now  - will recommend gradually stop klonopin due to memory and confusion  - will follow tomorrow     Wonda Cerise 03/30/2012, 7:18 PM

## 2012-03-30 NOTE — Progress Notes (Signed)
Patient returned from HD at 1430. Bilateral wrist restraints in place. Verbal order received to continue wrist restraints. Order entered into patient chart later in the shift.

## 2012-03-30 NOTE — Consult Note (Signed)
Consult: urethro-cutaneous fistula Requested by Dr. Arta Silence  History of Present Illness: Bryan Gilmore is a 61 y.o. male with ESRD (started during June 2013 admission) on HD. He underwent cystoscopy, baloon dilation of urethral stricture, foley placement and debridement of scrotal/perineal/peri-rectal necrotizing fascitis February 17, 2012 by my associate Dr. Sherron Monday. He was taken back to OR February 29, 2012 for partial wound closure.   He was discharged from [facility] (6/21 - 03/26/2012 admission) to [facility] where he was treated following sepsis secondary to Fournier's gangrene s/p debridement. During the [facility] admission, he also had a Hartman procedure with end colostomy and umbilical hernia repair due to soilage of wound with incontinence and diarrhea. When he presented to the [facility] dialysis center yesterday, he was confused and argumentative and unable to sign consents. At some point, he pulled his dialysis catheter out and arrangements were made for replacement and inpatient dialysis.   On exam his foley was noted to be visible through the perineal wound. The foley was removed earlier today.    Past Medical History  Diagnosis Date  . Hypertension   . HIV (human immunodeficiency virus infection)   . Sepsis   . Gout   . Hepatitis C   . Fournier's gangrene   . Anemia   . Severe protein-calorie malnutrition   . End stage renal disease    Past Surgical History  Procedure Date  . Cystoscopy with urethral dilatation 02/17/2012    Procedure: CYSTOSCOPY WITH URETHRAL DILATATION;  Surgeon: Martina Sinner, MD;  Location: [facility];  Service: Urology;  Laterality: N/A;  retrograde urethragram, placement of foley catheter exam under anesthesia   . Insertion of dialysis catheter 02/20/2012    Procedure: INSERTION OF DIALYSIS CATHETER;  Surgeon: Chuck Hint, MD;  Location: [facility] OR;  Service: Vascular;  Laterality: N/A;  . Irrigation and debridement abscess 02/29/2012    Procedure:  IRRIGATION AND DEBRIDEMENT ABSCESS;  Surgeon: Martina Sinner, MD;  Location: [facility] OR;  Service: Urology;  Laterality: N/A;  I&D of scrotum abcess    Home Medications:  Prescriptions prior to admission  Medication Sig Dispense Refill  . acetaminophen (TYLENOL) 325 MG tablet Take 650 mg by mouth every 4 (four) hours as needed. For fever beyond 24 hours.      Marland Kitchen allopurinol (ZYLOPRIM) 300 MG tablet Take 300 mg by mouth daily.      . calcium acetate (PHOSLO) 667 MG capsule Take 2 capsules (1,334 mg total) by mouth 3 (three) times daily with meals.  90 capsule  1  . clonazePAM (KLONOPIN) 0.5 MG tablet Take 0.5 mg by mouth every 12 (twelve) hours.      Marland Kitchen emtricitabine-tenofovir (TRUVADA) 200-300 MG per tablet Take 1 tablet by mouth every Monday, Wednesday, and Friday.        . lamivudine (EPIVIR) 100 MG tablet Take 50 mg by mouth daily.      Marland Kitchen lopinavir-ritonavir (KALETRA) 400-100 MG/5ML solution Take 5 mLs (400 mg total) by mouth 2 (two) times daily with a meal.  160 mL  1  . mirtazapine (REMERON) 15 MG tablet Take 15 mg by mouth at bedtime.      . Multiple Vitamin (THERA PO) Take 1 tablet by mouth daily.      . Nebivolol HCl (BYSTOLIC) 20 MG TABS Take 20 mg by mouth daily.      . Nutritional Supplements (FEEDING SUPPLEMENT, NEPRO CARB STEADY,) LIQD Take 237 mLs by mouth 2 (two) times daily.      Marland Kitchen  pantoprazole (PROTONIX) 40 MG tablet Take 40 mg by mouth daily.      . potassium chloride SA (K-DUR,KLOR-CON) 20 MEQ tablet Take 2 tablets (40 mEq total) by mouth 2 (two) times daily.  2 tablet  0  . QUEtiapine (SEROQUEL) 100 MG tablet Take 100 mg by mouth 3 (three) times daily.      . QUEtiapine (SEROQUEL) 50 MG tablet Take 50 mg by mouth every 8 (eight) hours as needed. For depressive disorder.      . raltegravir (ISENTRESS) 400 MG tablet Take 400 mg by mouth 2 (two) times daily.        . zidovudine (RETROVIR) 100 MG capsule Take 300 mg by mouth daily.       Allergies: No Known Allergies  Family  History  Problem Relation Age of Onset  . Hypertension Mother   . Hypertension Father    Social History:  reports that he has never smoked. He has never used smokeless tobacco. He reports that he drinks alcohol. He reports that he does not use illicit drugs.  ROS: A complete review of systems was unable to be performed.   Physical Exam:  Vital signs in last 24 hours: Temp:  [97.7 F (36.5 C)-101 F (38.3 C)] 100.3 F (37.9 C) (07/20 2030) Pulse Rate:  [84-125] 118  (07/20 2030) Resp:  [14-23] 18  (07/20 2030) BP: (82-138)/(36-80) 102/65 mmHg (07/20 2030) SpO2:  [95 %-100 %] 100 % (07/20 2030) Weight:  [85.2 kg (187 lb 13.3 oz)-87.4 kg (192 lb 10.9 oz)] 87 kg (191 lb 12.8 oz) (07/20 2030) General:  Drowsy but arousable. Not oriented. No acute distress. HEENT: Normocephalic, atraumatic Neck: No JVD or lymphadenopathy Cardiovascular: Regular rate and rhythm Lungs: Regular rate and effort Abdomen: Soft, nontender, nondistended, no abdominal masses. Colostomy on left.  GU: penis normal without mass. Scrotal, perineal and peri-rectal wound healthy with beefy red granulation tissue. After foley placement (see below), the foley was visible in the area of the bulb urethra (area of prior stricture) Back: No CVA tenderness Extremities: No edema  Procedure - penis was prepped and draped. An 18Fr coude was placed without difficulty. 300 ml urine drained. Urine clear, no hematuria.   Laboratory Data:  Results for orders placed during the hospital encounter of 03/29/12 (from the past 24 hour(s))  CBC     Status: Abnormal   Collection Time   03/30/12  6:00 AM      Component Value Range   WBC 11.6 (*) 4.0 - 10.5 K/uL   RBC 2.84 (*) 4.22 - 5.81 MIL/uL   Hemoglobin 8.9 (*) 13.0 - 17.0 g/dL   HCT 11.9 (*) 14.7 - 82.9 %   MCV 100.4 (*) 78.0 - 100.0 fL   MCH 31.3  26.0 - 34.0 pg   MCHC 31.2  30.0 - 36.0 g/dL   RDW 56.2 (*) 13.0 - 86.5 %   Platelets 408 (*) 150 - 400 K/uL  RENAL FUNCTION  PANEL     Status: Abnormal   Collection Time   03/30/12  6:00 AM      Component Value Range   Sodium 144  135 - 145 mEq/L   Potassium 5.1  3.5 - 5.1 mEq/L   Chloride 102  96 - 112 mEq/L   CO2 24  19 - 32 mEq/L   Glucose, Bld 85  70 - 99 mg/dL   BUN 35 (*) 6 - 23 mg/dL   Creatinine, Ser 7.84 (*) 0.50 - 1.35 mg/dL  Calcium 10.1  8.4 - 10.5 mg/dL   Phosphorus 5.7 (*) 2.3 - 4.6 mg/dL   Albumin 2.3 (*) 3.5 - 5.2 g/dL   GFR calc non Af Amer 8 (*) >90 mL/min   GFR calc Af Amer 9 (*) >90 mL/min  CBC     Status: Abnormal   Collection Time   03/30/12  8:23 AM      Component Value Range   WBC 11.4 (*) 4.0 - 10.5 K/uL   RBC 2.70 (*) 4.22 - 5.81 MIL/uL   Hemoglobin 8.5 (*) 13.0 - 17.0 g/dL   HCT 16.1 (*) 09.6 - 04.5 %   MCV 98.5  78.0 - 100.0 fL   MCH 31.5  26.0 - 34.0 pg   MCHC 32.0  30.0 - 36.0 g/dL   RDW 40.9 (*) 81.1 - 91.4 %   Platelets 419 (*) 150 - 400 K/uL  RENAL FUNCTION PANEL     Status: Abnormal   Collection Time   03/30/12  8:23 AM      Component Value Range   Sodium 145  135 - 145 mEq/L   Potassium 4.9  3.5 - 5.1 mEq/L   Chloride 102  96 - 112 mEq/L   CO2 25  19 - 32 mEq/L   Glucose, Bld 82  70 - 99 mg/dL   BUN 35 (*) 6 - 23 mg/dL   Creatinine, Ser 7.82 (*) 0.50 - 1.35 mg/dL   Calcium 9.8  8.4 - 95.6 mg/dL   Phosphorus 5.9 (*) 2.3 - 4.6 mg/dL   Albumin 2.4 (*) 3.5 - 5.2 g/dL   GFR calc non Af Amer 8 (*) >90 mL/min   GFR calc Af Amer 9 (*) >90 mL/min  FERRITIN     Status: Abnormal   Collection Time   03/30/12  8:23 AM      Component Value Range   Ferritin 1434 (*) 22 - 322 ng/mL  IRON AND TIBC     Status: Abnormal   Collection Time   03/30/12  8:23 AM      Component Value Range   Iron 20 (*) 42 - 135 ug/dL   TIBC 213  086 - 578 ug/dL   Saturation Ratios 8 (*) 20 - 55 %   UIBC 235  125 - 400 ug/dL  AMMONIA     Status: Normal   Collection Time   03/30/12  9:28 AM      Component Value Range   Ammonia 15  11 - 60 umol/L   Recent Results (from the past 240  hour(s))  SURGICAL PCR SCREEN     Status: Abnormal   Collection Time   03/29/12  7:45 AM      Component Value Range Status Comment   MRSA, PCR POSITIVE (*) NEGATIVE Final    Staphylococcus aureus POSITIVE (*) NEGATIVE Final    Creatinine:  Basename 03/30/12 0823 03/30/12 0600  CREATININE 6.81* 6.72*    Impression/Assessment/Plan:  scrotal/perineal/peri-rectal necrotizing fascitis - healthy wound bed - agree with wound care 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    Antony Haste 03/30/2012, 11:32 PM

## 2012-03-30 NOTE — Progress Notes (Signed)
Off unit.

## 2012-03-30 NOTE — Progress Notes (Signed)
[facility] Progress Note  Subjective:  Remains confused.   Objective Filed Vitals:   03/29/12 2305 03/30/12 0640 03/30/12 0700 03/30/12 0730  BP: 105/72 92/36 96/62  89/49  Pulse: 93 99 84 107  Temp: 99.9 F (37.7 C) 97.7 F (36.5 C)    TempSrc: Oral Oral    Resp: 18 23 18 20   Weight:  85.2 kg (187 lb 13.3 oz)    SpO2: 97% 97%     Physical Exam General: on HD with hand restraints in place. Only slightly restless today compared to yesterday HEENT: Some teeth broken, in poor repair, tongue red (unable to get him to open mouth further). Oropharynx w lots of debris on gums but don't see obvious buccal exudates or plaques.  Heart: tachy with multiple PVCs - rate 110 - 120 Lungs: grossly clear anteriorally Abdomen: soft; midline incision in tact; large volume brown liquid stool in colostomy; right groin prior fem cath site no bleeding; foley catheter in place - no urine in bag GU: perineal wound  Extremities: no edema; no skin breakdown; SCDS in place Dialysis Access: right I-J fx at 400 after overnight cathflo  Assessment/Plan: 1. Hyperkalemia/ ESRD - S/p placment of temporary catheter removed after tunneled I-J cath placement by [facility]. Had elevated post op K. Redialyzed last pm but poor flows; K this am 5.1; catheter fx well today to start but  flow decreasing; add heparin - no bleeding from catheter exit.  2. Hypertension/volume - tachy/ hypotensive on bystolic- last dose yesterday pm; parameters written to hold; no volume excess - keep even today with bolus saline given; wonder if infection could be a component 3. Anemia -Hgb 8.9  7/10; not on Aranesp on d/c med list. Start Aranesp 100 today check Fe studies,B12 and folate 4. Metabolic bone disease - using 4.09 Ca bath;  iPTH in June was 900s; zemplar 2 mcg IV with HD; check iPTH. 5. Fournier's gangrene- s/p I&D early June per Urology here and done again at [facility]. Wounds look clean. Foley cath placed per Urol rec's for  this problem. On exam, foley tip and balloon are visible in open wound in front of testicles. Have called urology to evaluate. 6. Nutrition - NPO for swallowing evaluation - severe protein malnutrition 7. Thrush- Will try empiric thrush Rx w oral diflucan x 7d, not sure he would cooperate w topical therapies due to AMS. 8. Psych/AMS - continue meds as per [facility] d/c summary;Not competent to sign dialysis paper; will need family member to accompany him; need records regarding psych issues from [facility] as well. Will consult psychiatry for assistance. The NH had him on 100 seroquel tid with 50 tid prn; we have decreased it to 25 mg tid.  9. Leukoctyosis? Infection component, will get blood and urine cultures; urine suggestive of UTI; mild leukocytosis temp 99.9 last pm; not febrile now; Start empiric vanc and fortaz; consider head CT if no improvement in MS with antibiotics. Will give fortaz 2 gm IV on HD days for simplicity and decrease risk of pulling out IV line. Also will check ammonia level/TSH; CMP with am labs. Pt is in a visually monitored room 10. HIV - I have reviewed NH and [facility] HIV meds and they are not the same. Will hold all HIV meds for now. May need ID input. Called ID, Dr. Orvan Falconer, they will see him on Monday re: HIV meds, if needed call over W/E. CD4. 180 6/8 11. D/C planning - SW consulted- need to determine if he  is going to be appropriate for outpatient dialysis and see if [facility] will accept him back. He is unable to be discharged until we determine competency to sign papers for outpatient dialysis or find a family member to accompany him. He needs to demonstrate safe behaviors on dialysis before he can be discharged. He is requiring wrist restraints for safety now. 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 Hep C +  Sheffield Slider, PA-C [facility] [phone]  03/30/2012,8:21 AM  LOS: 1 day   Patient seen and examined and agree with assessment and  plan as above with additions as indicated.  Bryan Moselle  MD [facility] [phone] pgr    [phone] cell 03/30/2012, 10:26 AM       Additional Objective Labs: Basic Metabolic Panel:  Lab 03/30/12 4696 03/29/12 1625 03/29/12 0805 03/29/12 0801  NA 144 145 -- 142  K 5.1 6.2* 6.8* --  CL 102 -- -- --  CO2 24 -- -- --  GLUCOSE 85 88 -- 96  BUN 35* -- -- --  CREATININE 6.72* -- -- --  CALCIUM 10.1 -- -- --  ALB -- -- -- --  PHOS 5.7* -- -- --   Liver Function Tests:  Lab 03/30/12 0600  AST --  ALT --  ALKPHOS --  BILITOT --  PROT --  ALBUMIN 2.3*  CBC:  Lab 03/30/12 0600 03/29/12 1625 03/29/12 0801  WBC 11.6* -- --  NEUTROABS -- -- --  HGB 8.9* 9.2* 9.9*  HCT 28.5* 27.0* 29.0*  MCV 100.4* -- --  PLT 408* -- --   Studies/Results: Dg Chest 1 View  03/29/2012  *RADIOLOGY REPORT*  Clinical Data: Catheter pulled out.  Insertion of dialysis catheter.  CHEST - 1 VIEW  Comparison: 02/22/2012  Findings: Previously seen right-sided dual lumen catheter is no longer present.  The heart is mildly enlarged.  The aorta is unfolded.  Lungs are clear.  The vascularity is normal.  No effusions.  No pneumothorax.  No bony abnormality.  IMPRESSION: No active disease.  Previously seen right dual lumen catheter now absent.  Original Report Authenticated By: Thomasenia Sales, M.D.   Dg Chest Port 1 View  03/29/2012  *RADIOLOGY REPORT*  Clinical Data: Post right IJ dialysis catheter placement  PORTABLE CHEST - 1 VIEW  Comparison: 03/29/2012; 02/22/2012  Findings: Grossly unchanged borderline enlarged cardiac silhouette and mediastinal contours.  Atherosclerotic calcifications within the aortic arch.  Interval placement of a right jugular approach dialysis catheter with tips overlying the superior aspect of the right atrium.  No pneumothorax or pleural effusion.  Unchanged bones.  IMPRESSION: Interval placement of right jugular approach dialysis catheter with tips overlying the  superior aspect of the right atrium.  Original Report Authenticated By: Waynard Reeds, M.D.      Medications:    . DISCONTD: sodium chloride        . allopurinol  300 mg Oral Daily  . alteplase  4 mg Intracatheter STAT  . calcium acetate  1,334 mg Oral TID WC  . clonazePAM  0.5 mg Oral Q12H  . feeding supplement (NEPRO CARB STEADY)  237 mL Oral BID  . LORazepam      . LORazepam  1 mg Intravenous Once  . mirtazapine  15 mg Oral QHS  . multivitamin  1 tablet Oral QHS  . mupirocin ointment   Nasal BID  . nebivolol  20 mg Oral Once  . nebivolol  20 mg Oral  QHS  . pantoprazole  40 mg Oral Q1200  . paricalcitol  2 mcg Intravenous 3 times weekly  . QUEtiapine  25 mg Oral TID  . sodium chloride  3 mL Intravenous Q12H  . sodium polystyrene  30 g Oral Once  . DISCONTD: cefUROXime (ZINACEF)  IV  1.5 g Intravenous 30 min Pre-Op  . DISCONTD: lopinavir-ritonavir  400 mg Oral BID WC  . DISCONTD: Nebivolol HCl  20 mg Oral QHS  . DISCONTD: QUEtiapine  50 mg Oral TID  . DISCONTD: zidovudine  300 mg Oral Daily

## 2012-03-31 LAB — COMPREHENSIVE METABOLIC PANEL
ALT: 15 U/L (ref 0–53)
AST: 38 U/L — ABNORMAL HIGH (ref 0–37)
Albumin: 2.1 g/dL — ABNORMAL LOW (ref 3.5–5.2)
Alkaline Phosphatase: 101 U/L (ref 39–117)
BUN: 10 mg/dL (ref 6–23)
CO2: 26 mEq/L (ref 19–32)
Calcium: 9.6 mg/dL (ref 8.4–10.5)
Chloride: 98 mEq/L (ref 96–112)
Creatinine, Ser: 3.35 mg/dL — ABNORMAL HIGH (ref 0.50–1.35)
GFR calc Af Amer: 21 mL/min — ABNORMAL LOW (ref >90)
GFR calc non Af Amer: 18 mL/min — ABNORMAL LOW (ref >90)
Glucose, Bld: 90 mg/dL (ref 70–99)
Potassium: 3.6 mEq/L (ref 3.5–5.1)
Sodium: 138 mEq/L (ref 135–145)
Total Bilirubin: 0.7 mg/dL (ref 0.3–1.2)
Total Protein: 6.9 g/dL (ref 6.0–8.3)

## 2012-03-31 LAB — PHOSPHORUS: Phosphorus: 4 mg/dL (ref 2.3–4.6)

## 2012-03-31 LAB — CBC
HCT: 26.1 % — ABNORMAL LOW (ref 39.0–52.0)
Hemoglobin: 8.3 g/dL — ABNORMAL LOW (ref 13.0–17.0)
MCH: 31.4 pg (ref 26.0–34.0)
MCHC: 31.8 g/dL (ref 30.0–36.0)
MCV: 98.9 fL (ref 78.0–100.0)
Platelets: 309 10*3/uL (ref 150–400)
RBC: 2.64 MIL/uL — ABNORMAL LOW (ref 4.22–5.81)
RDW: 19.8 % — ABNORMAL HIGH (ref 11.5–15.5)
WBC: 8.6 10*3/uL (ref 4.0–10.5)

## 2012-03-31 LAB — TSH: TSH: 6.222 u[IU]/mL — ABNORMAL HIGH (ref 0.350–4.500)

## 2012-03-31 LAB — VITAMIN B12: Vitamin B-12: 717 pg/mL (ref 211–911)

## 2012-03-31 MED ORDER — NEPRO/CARBSTEADY PO LIQD
237.0000 mL | Freq: Three times a day (TID) | ORAL | Status: DC
  Administered 2012-03-31 – 2012-04-03 (×5): 237 mL via ORAL

## 2012-03-31 MED ORDER — SODIUM CHLORIDE 0.9 % IV SOLN
125.0000 mg | INTRAVENOUS | Status: DC
  Administered 2012-04-02 – 2012-04-13 (×6): 125 mg via INTRAVENOUS
  Filled 2012-03-31 (×13): qty 10

## 2012-03-31 NOTE — Consult Note (Signed)
Reason for Consult: AMS Referring Physician: unknown  [facility] is an 61 y.o. male.  HPI:  [facility] is a 61 y.o. male with ESRD (started during June 2013 admission ) on HD who was discharged from [facility] (6/21 - 03/26/2012 admission) to [facility] where he was treated following sepsis secondary to Fournier's gangrene s/p debridement. he was confused and argumentative and unable to sign consents.   Interval Hx:   Per family he is less confused now. Poor historian. Chronic drinker very heavy per family. He could not tell the name of his daughter and other family on multiple efforts. Not sure where he is right now and about his medical issues. More calmer today.  Past Medical History  Diagnosis Date  . Hypertension   . HIV (human immunodeficiency virus infection)   . Sepsis   . Gout   . Hepatitis C   . Fournier's gangrene   . Anemia   . Severe protein-calorie malnutrition   . End stage renal disease     Past Surgical History  Procedure Date  . Cystoscopy with urethral dilatation 02/17/2012    Procedure: CYSTOSCOPY WITH URETHRAL DILATATION;  Surgeon: Martina Sinner, MD;  Location: [facility] ORS;  Service: Urology;  Laterality: N/A;  retrograde urethragram, placement of foley catheter exam under anesthesia   . Insertion of dialysis catheter 02/20/2012    Procedure: INSERTION OF DIALYSIS CATHETER;  Surgeon: Chuck Hint, MD;  Location: [facility] OR;  Service: Vascular;  Laterality: N/A;  . Irrigation and debridement abscess 02/29/2012    Procedure: IRRIGATION AND DEBRIDEMENT ABSCESS;  Surgeon: Martina Sinner, MD;  Location: [facility] OR;  Service: Urology;  Laterality: N/A;  I&D of scrotum abcess    Family History  Problem Relation Age of Onset  . Hypertension Mother   . Hypertension Father     Social History:  reports that he has never smoked. He has never used smokeless tobacco. He reports that he drinks alcohol. He reports that he does not use illicit  drugs.  Allergies: No Known Allergies  Medications: I have reviewed the patient's current medications.  Results for orders placed during the hospital encounter of 03/29/12 (from the past 48 hour(s))  POCT I-STAT 4, (NA,K, GLUC, HGB,HCT)     Status: Abnormal   Collection Time   03/29/12  4:25 PM      Component Value Range Comment   Sodium 145  135 - 145 mEq/L    Potassium 6.2 (*) 3.5 - 5.1 mEq/L    Glucose, Bld 88  70 - 99 mg/dL    HCT 16.1 (*) 09.6 - 52.0 %    Hemoglobin 9.2 (*) 13.0 - 17.0 g/dL    Comment MD NOTIFIED, REPEAT TEST     CULTURE, BLOOD (ROUTINE X 2)     Status: Normal (Preliminary result)   Collection Time   03/29/12  6:56 PM      Component Value Range Comment   Specimen Description BLOOD HEMODIALYSIS CATHETER      Special Requests BOTTLES DRAWN AEROBIC AND ANAEROBIC 10CC      Culture  Setup Time 03/30/2012 01:45      Culture        Value:        BLOOD CULTURE RECEIVED NO GROWTH TO DATE CULTURE WILL BE HELD FOR 5 DAYS BEFORE ISSUING A FINAL NEGATIVE REPORT   Report Status PENDING     CULTURE, BLOOD (ROUTINE X 2)     Status: Normal (Preliminary result)  Collection Time   03/29/12  6:57 PM      Component Value Range Comment   Specimen Description BLOOD HEMODIALYSIS CATHETER      Special Requests BOTTLES DRAWN AEROBIC AND ANAEROBIC 10CC      Culture  Setup Time 03/30/2012 01:46      Culture        Value:        BLOOD CULTURE RECEIVED NO GROWTH TO DATE CULTURE WILL BE HELD FOR 5 DAYS BEFORE ISSUING A FINAL NEGATIVE REPORT   Report Status PENDING     CBC     Status: Abnormal   Collection Time   03/30/12  6:00 AM      Component Value Range Comment   WBC 11.6 (*) 4.0 - 10.5 K/uL    RBC 2.84 (*) 4.22 - 5.81 MIL/uL    Hemoglobin 8.9 (*) 13.0 - 17.0 g/dL    HCT 14.7 (*) 82.9 - 52.0 %    MCV 100.4 (*) 78.0 - 100.0 fL    MCH 31.3  26.0 - 34.0 pg    MCHC 31.2  30.0 - 36.0 g/dL    RDW 56.2 (*) 13.0 - 15.5 %    Platelets 408 (*) 150 - 400 K/uL   RENAL FUNCTION PANEL      Status: Abnormal   Collection Time   03/30/12  6:00 AM      Component Value Range Comment   Sodium 144  135 - 145 mEq/L    Potassium 5.1  3.5 - 5.1 mEq/L    Chloride 102  96 - 112 mEq/L    CO2 24  19 - 32 mEq/L    Glucose, Bld 85  70 - 99 mg/dL    BUN 35 (*) 6 - 23 mg/dL    Creatinine, Ser 8.65 (*) 0.50 - 1.35 mg/dL    Calcium 78.4  8.4 - 10.5 mg/dL    Phosphorus 5.7 (*) 2.3 - 4.6 mg/dL    Albumin 2.3 (*) 3.5 - 5.2 g/dL    GFR calc non Af Amer 8 (*) >90 mL/min    GFR calc Af Amer 9 (*) >90 mL/min   CBC     Status: Abnormal   Collection Time   03/30/12  8:23 AM      Component Value Range Comment   WBC 11.4 (*) 4.0 - 10.5 K/uL    RBC 2.70 (*) 4.22 - 5.81 MIL/uL    Hemoglobin 8.5 (*) 13.0 - 17.0 g/dL    HCT 69.6 (*) 29.5 - 52.0 %    MCV 98.5  78.0 - 100.0 fL    MCH 31.5  26.0 - 34.0 pg    MCHC 32.0  30.0 - 36.0 g/dL    RDW 28.4 (*) 13.2 - 15.5 %    Platelets 419 (*) 150 - 400 K/uL   RENAL FUNCTION PANEL     Status: Abnormal   Collection Time   03/30/12  8:23 AM      Component Value Range Comment   Sodium 145  135 - 145 mEq/L    Potassium 4.9  3.5 - 5.1 mEq/L    Chloride 102  96 - 112 mEq/L    CO2 25  19 - 32 mEq/L    Glucose, Bld 82  70 - 99 mg/dL    BUN 35 (*) 6 - 23 mg/dL    Creatinine, Ser 4.40 (*) 0.50 - 1.35 mg/dL    Calcium 9.8  8.4 - 10.2 mg/dL    Phosphorus  5.9 (*) 2.3 - 4.6 mg/dL    Albumin 2.4 (*) 3.5 - 5.2 g/dL    GFR calc non Af Amer 8 (*) >90 mL/min    GFR calc Af Amer 9 (*) >90 mL/min   FERRITIN     Status: Abnormal   Collection Time   03/30/12  8:23 AM      Component Value Range Comment   Ferritin 1434 (*) 22 - 322 ng/mL   IRON AND TIBC     Status: Abnormal   Collection Time   03/30/12  8:23 AM      Component Value Range Comment   Iron 20 (*) 42 - 135 ug/dL    TIBC 161  096 - 045 ug/dL    Saturation Ratios 8 (*) 20 - 55 %    UIBC 235  125 - 400 ug/dL   AMMONIA     Status: Normal   Collection Time   03/30/12  9:28 AM      Component Value Range Comment    Ammonia 15  11 - 60 umol/L   CBC     Status: Abnormal   Collection Time   03/31/12  5:26 AM      Component Value Range Comment   WBC 8.6  4.0 - 10.5 K/uL    RBC 2.64 (*) 4.22 - 5.81 MIL/uL    Hemoglobin 8.3 (*) 13.0 - 17.0 g/dL    HCT 40.9 (*) 81.1 - 52.0 %    MCV 98.9  78.0 - 100.0 fL    MCH 31.4  26.0 - 34.0 pg    MCHC 31.8  30.0 - 36.0 g/dL    RDW 91.4 (*) 78.2 - 15.5 %    Platelets 309  150 - 400 K/uL DELTA CHECK NOTED  COMPREHENSIVE METABOLIC PANEL     Status: Abnormal   Collection Time   03/31/12  5:26 AM      Component Value Range Comment   Sodium 138  135 - 145 mEq/L    Potassium 3.6  3.5 - 5.1 mEq/L    Chloride 98  96 - 112 mEq/L    CO2 26  19 - 32 mEq/L    Glucose, Bld 90  70 - 99 mg/dL    BUN 10  6 - 23 mg/dL    Creatinine, Ser 9.56 (*) 0.50 - 1.35 mg/dL    Calcium 9.6  8.4 - 21.3 mg/dL    Total Protein 6.9  6.0 - 8.3 g/dL    Albumin 2.1 (*) 3.5 - 5.2 g/dL    AST 38 (*) 0 - 37 U/L    ALT 15  0 - 53 U/L    Alkaline Phosphatase 101  39 - 117 U/L    Total Bilirubin 0.7  0.3 - 1.2 mg/dL    GFR calc non Af Amer 18 (*) >90 mL/min    GFR calc Af Amer 21 (*) >90 mL/min   PHOSPHORUS     Status: Normal   Collection Time   03/31/12  5:26 AM      Component Value Range Comment   Phosphorus 4.0  2.3 - 4.6 mg/dL     No results found.  ROS  Blood pressure 94/64, pulse 114, temperature 97.5 F (36.4 C), temperature source Axillary, resp. rate 18, weight 87 kg (191 lb 12.8 oz), SpO2 92.00%. Physical Exam   Alert on bed   Mental Status Examination/Evaluation:  Appearance: on bed confused  Eye Contact:: poor  Speech: limited  Volume: low  Mood: no answer  Affect: ristricted  Thought Process: disorganized  Orientation: 0/4  Thought Content: denies AVH  Suicidal Thoughts: No  Homicidal Thoughts: no  Memory: Recent; Poor  Judgement: Impaired  Insight: Lacking  Psychomotor Activity: slow  Concentration: poor  Recall: poor  Akathisia: No    Assessment:    AXIS I: Delirium d/o NOS, Alcohol Dep, cognitive d/o nos, r/o dementia  AXIS II: Deferred  AXIS III: see emdical hx  ?   AXIS IV: problems related to social environment, problems with access to health care services and problems with primary support group  AXIS V: 15  ?    Treatment Plan/Recommendations:   - Likley delirium on the top on the top of significant cognitive deficits. He is improving now  - No new rec. plz see my note dated 7/20 for med rec.   - will sign off now. Thanks for consult. plz call us again if need     Wonda Cerise 03/31/2012, 3:47 PM

## 2012-03-31 NOTE — Progress Notes (Addendum)
[facility] Progress Note  Subjective:  Denies pain; "at [facility]". Per  Nursing ate almost all of breakfast.  Objective Filed Vitals:   03/30/12 1414 03/30/12 1654 03/30/12 2030 03/31/12 0606  BP: 138/80 99/50 102/65 110/64  Pulse: 93 98 118 89  Temp: 101 F (38.3 C) 99.2 F (37.3 C) 100.3 F (37.9 C) 97.4 F (36.3 C)  TempSrc: Oral Oral Oral Oral  Resp: 18 17 18 18   Weight:   87 kg (191 lb 12.8 oz)   SpO2: 96% 100% 100% 92%   Physical Exam General: Somnolent, unable to wake him up Heart: RRR with frequent ectopy Lungs: no wheezes or rales G-U: foley replaced; wound not examined Abdomen: colostomy large  Liquid stool Extremities: no lower edema; soft wrist restraints for safety  Dialysis Access: right I-J  Assessment/Plan:  1. ESRD - started dialysis on recent admission for Fournier's gangrene- has new  tunneled I-J cath placement by [facility].  No permanent access was placed during that admission due to infection concerns.  2. Hypertension/volume - tachy/ hypotensive on bystolic- last dose yesterday pm; parameters written to hold; no volume excess - wt increased on HD 7/20 given 1700 for BP support 3. Anemia -Hgb 8.9 7/10; not on Aranesp on d/c med list from [facility].. Start Aranesp 100 today check Fe studies,B12 and folate Fe low at 20 with 8 % sat.; has elevated ferritin at 1400, but still think he needs additional iron; will dose 125 x 5 IV Fe plus Aranesp 4. Metabolic bone disease - using 1.61 Ca bath; iPTH in June was 900s; zemplar 2 mcg IV with HD; check iPTH - pending 5. Fournier's gangrene- s/p I&D early June per Urology here and done again at [facility]. Wounds look clean. Nursing took out foley yesterday due to malposition; Urology re-evaluated yesterday and reinserted foley with 300 cc urine obtained. 6. Nutrition - Alb 2.1 - severe; had swallowing study - D3 high protein renal (mech soft and thin liquids with supervision) protein malnutrition; add  nepro supplements tid 7. Thrush- Will try empiric thrush Rx w oral diflucan x 7d, not sure he would cooperate w topical therapies due to AMS. 8. Psych/AMS - sometime since being discharge to [facility], patient was started on multiple psych meds including Remeron, Seroquel and Klonopin. Appreciate psychiatry consult. However, family does not want patient receiving psych meds anymore (no hx of any psych illness prior to Fournier's admission) and I, too, am concerned because he was "normal" the last time I saw him prior to going to [facility] and now he looks overmedicated.  We will stop all psych meds and follow closely.  9. Leukoctyosis? Infection component, will get blood and urine cultures; urine suggestive of UTI; WBC normal today after Vanc and Fortaz started 7/20.  However fever spike yesterday to 101. Afebrile this am. ;  consider head CT if no improvement in MS with antibiotics. Will give fortaz 2 gm IV on HD days for simplicity and decrease risk of pulling out IV line. NH3 normal.; CMP - no unusual findings. Pt is in a visually monitored room. Perineal wound - no overt evidence of infection. Urology has evaluated. Hopefully ID can help [place] with this issue when they see him on Monday also.  10. HIV - reviewed NH and [facility] HIV meds and they are not the same. Will hold all HIV meds for now. We called ID, Dr. Orvan Falconer, they will see him on Monday re: HIV meds, if needed call over W/E. CD4.  180 6/8 11. D/C planning - SW consulted- need to determine if he is going to be appropriate for outpatient dialysis and see if [facility]. He is unable to be discharged until we determine competency to sign papers for outpatient dialysis or find a family member to accompany him. He needs to demonstrate safe behaviors on dialysis before he can be discharged. 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 Hep C + 14. Hx of EtOH abuse    Sheffield Slider, PA-C [facility] [phone]  03/31/2012,8:11 AM  LOS: 2 days   Patient seen and examined and agree with assessment and plan as above with additions as indicated.  Vinson Moselle  MD [facility] [phone] pgr    [phone] cell 03/31/2012, 2:24 PM       Additional Objective Labs: Basic Metabolic Panel:  Lab 03/31/12 7846 03/30/12 0823 03/30/12 0600  NA 138 145 144  K 3.6 4.9 5.1  CL 98 102 102  CO2 26 25 24   GLUCOSE 90 82 85  BUN 10 35* 35*  CREATININE 3.35* 6.81* 6.72*  CALCIUM 9.6 9.8 10.1  ALB -- -- --  PHOS 4.0 5.9* 5.7*   Liver Function Tests:  Lab 03/31/12 0526 03/30/12 0823 03/30/12 0600  AST 38* -- --  ALT 15 -- --  ALKPHOS 101 -- --  BILITOT 0.7 -- --  PROT 6.9 -- --  ALBUMIN 2.1* 2.4* 2.3*    Lab 03/30/12 0928  AMMONIA 15  CBC:  Lab 03/31/12 0526 03/30/12 0823 03/30/12 0600  WBC 8.6 11.4* 11.6*  NEUTROABS -- -- --  HGB 8.3* 8.5* 8.9*  HCT 26.1* 26.6* 28.5*  MCV 98.9 98.5 100.4*  PLT 309 419* 408*   BIron Studies:  Basename 03/30/12 0823  IRON 20*  TIBC 255  TRANSFERRIN --  FERRITIN 1434*   Studies/Results: Dg Chest 1 View  03/29/2012  *RADIOLOGY REPORT*  Clinical Data: Catheter pulled out.  Insertion of dialysis catheter.  CHEST - 1 VIEW  Comparison: 02/22/2012  Findings: Previously seen right-sided dual lumen catheter is no longer present.  The heart is mildly enlarged.  The aorta is unfolded.  Lungs are clear.  The vascularity is normal.  No effusions.  No pneumothorax.  No bony abnormality.  IMPRESSION: No active disease.  Previously seen right dual lumen catheter now absent.  Original Report Authenticated By: Thomasenia Sales, M.D.   Dg Chest Port 1 View  03/29/2012  *RADIOLOGY REPORT*  Clinical Data: Post right IJ dialysis catheter placement  PORTABLE CHEST - 1 VIEW  Comparison: 03/29/2012;  02/22/2012  Findings: Grossly unchanged borderline enlarged cardiac silhouette and mediastinal contours.  Atherosclerotic calcifications within the aortic arch.  Interval placement of a right jugular approach dialysis catheter with tips overlying the superior aspect of the right atrium.  No pneumothorax or pleural effusion.  Unchanged bones.  IMPRESSION: Interval placement of right jugular approach dialysis catheter with tips overlying the superior aspect of the right atrium.  Original Report Authenticated By: Waynard Reeds, M.D.   Dg Fluoro Guide Cv Line-no Report  03/29/2012  CLINICAL DATA: dialysis   FLOURO GUIDE CV LINE  Fluoroscopy was utilized by the requesting physician.  No radiographic  interpretation.     Medications:      . allopurinol  300 mg Oral Daily  .  calcium acetate  1,334 mg Oral TID WC  . cefTAZidime (FORTAZ)  IV  2 g Intravenous Q T,Th,Sa-HD  . Chlorhexidine Gluconate Cloth  6 each Topical Q0600  . clonazePAM  0.5 mg Oral Q12H  . darbepoetin (ARANESP) injection - DIALYSIS  100 mcg Intravenous Q Sat-HD  . feeding supplement (NEPRO CARB STEADY)  237 mL Oral BID  . fluconazole  100 mg Oral Daily  . heparin  1,500 Units Intravenous Once  . mirtazapine  15 mg Oral QHS  . multivitamin  1 tablet Oral QHS  . mupirocin ointment   Nasal BID  . nebivolol  20 mg Oral QHS  . pantoprazole  40 mg Oral Q1200  . paricalcitol  2 mcg Intravenous 3 times weekly  . QUEtiapine  25 mg Oral TID  . sodium chloride  3 mL Intravenous Q12H  . vancomycin  2,000 mg Intravenous Once  . vancomycin  1,000 mg Intravenous Q T,Th,Sa-HD  . DISCONTD: nebivolol  20 mg Oral QHS

## 2012-04-01 ENCOUNTER — Encounter (HOSPITAL_COMMUNITY): Payer: Self-pay | Admitting: Vascular Surgery

## 2012-04-01 DIAGNOSIS — R410 Disorientation, unspecified: Secondary | ICD-10-CM | POA: Diagnosis present

## 2012-04-01 DIAGNOSIS — R509 Fever, unspecified: Secondary | ICD-10-CM

## 2012-04-01 LAB — CBC
HCT: 25.5 % — ABNORMAL LOW (ref 39.0–52.0)
Hemoglobin: 8.4 g/dL — ABNORMAL LOW (ref 13.0–17.0)
MCH: 32.4 pg (ref 26.0–34.0)
MCHC: 32.9 g/dL (ref 30.0–36.0)
MCV: 98.5 fL (ref 78.0–100.0)
Platelets: 309 10*3/uL (ref 150–400)
RBC: 2.59 MIL/uL — ABNORMAL LOW (ref 4.22–5.81)
RDW: 20 % — ABNORMAL HIGH (ref 11.5–15.5)
WBC: 10 10*3/uL (ref 4.0–10.5)

## 2012-04-01 LAB — RENAL FUNCTION PANEL
Albumin: 2 g/dL — ABNORMAL LOW (ref 3.5–5.2)
BUN: 18 mg/dL (ref 6–23)
CO2: 25 mEq/L (ref 19–32)
Calcium: 9.5 mg/dL (ref 8.4–10.5)
Chloride: 99 mEq/L (ref 96–112)
Creatinine, Ser: 5.24 mg/dL — ABNORMAL HIGH (ref 0.50–1.35)
GFR calc Af Amer: 12 mL/min — ABNORMAL LOW (ref >90)
GFR calc non Af Amer: 11 mL/min — ABNORMAL LOW (ref >90)
Glucose, Bld: 100 mg/dL — ABNORMAL HIGH (ref 70–99)
Phosphorus: 4.6 mg/dL (ref 2.3–4.6)
Potassium: 3.1 mEq/L — ABNORMAL LOW (ref 3.5–5.1)
Sodium: 138 mEq/L (ref 135–145)

## 2012-04-01 LAB — POCT I-STAT, CHEM 8
BUN: 31 mg/dL — ABNORMAL HIGH (ref 6–23)
Chloride: 105 mEq/L (ref 96–112)
Creatinine, Ser: 4.4 mg/dL — ABNORMAL HIGH (ref 0.50–1.35)
Potassium: 3.6 mEq/L (ref 3.5–5.1)
Sodium: 144 mEq/L (ref 135–145)
TCO2: 27 mmol/L (ref 0–100)

## 2012-04-01 LAB — FOLATE RBC: RBC Folate: 1367 ng/mL — ABNORMAL HIGH (ref >=366)

## 2012-04-01 LAB — PARATHYROID HORMONE, INTACT (NO CA): PTH: 14 pg/mL (ref 14.0–72.0)

## 2012-04-01 MED ORDER — ZIDOVUDINE 100 MG PO CAPS
300.0000 mg | ORAL_CAPSULE | Freq: Every day | ORAL | Status: DC
  Administered 2012-04-01 – 2012-04-12 (×12): 300 mg via ORAL
  Filled 2012-04-01 (×13): qty 3

## 2012-04-01 MED ORDER — LAMIVUDINE 10 MG/ML PO SOLN
50.0000 mg | Freq: Every day | ORAL | Status: DC
  Administered 2012-04-01 – 2012-04-12 (×11): 50 mg via ORAL
  Filled 2012-04-01 (×15): qty 5

## 2012-04-01 MED ORDER — ZIDOVUDINE 50 MG/5ML PO SYRP
300.0000 mg | ORAL_SOLUTION | Freq: Every day | ORAL | Status: DC
  Filled 2012-04-01: qty 30

## 2012-04-01 MED ORDER — POTASSIUM CHLORIDE CRYS ER 20 MEQ PO TBCR
40.0000 meq | EXTENDED_RELEASE_TABLET | Freq: Once | ORAL | Status: AC
  Administered 2012-04-01: 40 meq via ORAL
  Filled 2012-04-01: qty 2

## 2012-04-01 MED ORDER — LOPINAVIR-RITONAVIR 400-100 MG/5ML PO SOLN
5.0000 mL | Freq: Two times a day (BID) | ORAL | Status: DC
  Administered 2012-04-01 – 2012-04-13 (×22): 400 mg via ORAL
  Filled 2012-04-01 (×31): qty 5

## 2012-04-01 NOTE — Consult Note (Signed)
[facility]          Day 3 vancomycin        Day 3 ceftazadime        Day 3 fluconazole       Reason for Consult: Fever and chronic HIV infection    Referring Physician: Dr. Delano Metz  Active Problems:  Fever  HIV INFECTION  HEPATITIS C  ESRD (end stage renal disease)  Hyperkalemia  Secondary hyperparathyroidism (of renal origin)  Delirium      . allopurinol  300 mg Oral Daily  . calcium acetate  1,334 mg Oral TID WC  . cefTAZidime (FORTAZ)  IV  2 g Intravenous Q T,Th,Sa-HD  . Chlorhexidine Gluconate Cloth  6 each Topical Q0600  . darbepoetin (ARANESP) injection - DIALYSIS  100 mcg Intravenous Q Sat-HD  . feeding supplement (NEPRO CARB STEADY)  237 mL Oral TID WC  . ferric gluconate (FERRLECIT/NULECIT) IV  125 mg Intravenous Q T,Th,Sa-HD  . fluconazole  100 mg Oral Daily  . multivitamin  1 tablet Oral QHS  . mupirocin ointment   Nasal BID  . nebivolol  20 mg Oral QHS  . pantoprazole  40 mg Oral Q1200  . paricalcitol  2 mcg Intravenous 3 times weekly  . sodium chloride  3 mL Intravenous Q12H  . vancomycin  1,000 mg Intravenous Q T,Th,Sa-HD  . DISCONTD: clonazePAM  0.5 mg Oral Q12H  . DISCONTD: mirtazapine  15 mg Oral QHS  . DISCONTD: QUEtiapine  25 mg Oral TID    Recommendations: 1. Continue current empiric antimicrobial therapy 2. Restart antiretroviral therapy with Retrovir, Epivir and Kaletra solutions  3. Check CD4 count and HIV viral load  Assessment: There is no clear source yet for his recent fever. His temperature and white blood cell count on the day so we'll continue his current empiric antimicrobial therapy. I will also restart antiretroviral therapy with his 3 drug regimen and repeat his CD4 count and HIV viral load. I will follow with you.    HPI: Bryan Gilmore is a 61 y.o. male who had been out of care for his chronic HIV infection since last August until he was admitted to the hospital here last month with foreign  his gangrene. He received treatment for his gangrene in my partner, Dr. Merceda Elks, restarted antiretroviral therapy with liquid Retrovir, Epivir and Kaletra. He took that until discharge when, for reasons I cannot explain his regimen was expanded to also include Truvada and Isentress on his discharge summary. I suspect that this was a mistake but it appears that was continued after transfer to [facility] and then to [facility] skilled nursing facility.He was readmitted here last week with fever and delirium. He had recently been started on several new psychotropic medications. Apparently he did not have any history of psychiatric problems or delirium prior to his admission last month. His CD4 count last month before restarting antiretroviral therapy was 180 and his HIV viral load was 198,302.   Review of Systems: Pertinent items are noted in HPI.  Past Medical History  Diagnosis Date  . Hypertension   . HIV (human immunodeficiency virus infection)   . Sepsis   . Gout   . Hepatitis C   . Fournier's gangrene   . Anemia   . Severe protein-calorie malnutrition   . End stage renal disease     History  Substance Use Topics  . Smoking status: Never Smoker   . Smokeless  tobacco: Never Used  . Alcohol Use: Yes     1 beer 2-3 x per month    Family History  Problem Relation Age of Onset  . Hypertension Mother   . Hypertension Father    No Known Allergies  OBJECTIVE: Blood pressure 112/70, pulse 98, temperature 98.4 F (36.9 C), temperature source Oral, resp. rate 20, weight 86 kg (189 lb 9.5 oz), SpO2 100.00%. General: He is alert, pleasant confused  Skin: his left lower quadrant ostomy bag has been removed and he had his sheets are covered in feces  Lungs: Clear  Cor: Regular S1 and S2 and no murmurs  Abdomen: Soft nontender he has a hemodialysis catheter in his right neck which appears normal perineum: His wounds were cleaned with red granulation tissue  Microbiology: Recent  Results (from the past 240 hour(s))  SURGICAL PCR SCREEN     Status: Abnormal   Collection Time   03/29/12  7:45 AM      Component Value Range Status Comment   MRSA, PCR POSITIVE (*) NEGATIVE Final    Staphylococcus aureus POSITIVE (*) NEGATIVE Final   CULTURE, BLOOD (ROUTINE X 2)     Status: Normal (Preliminary result)   Collection Time   03/29/12  6:56 PM      Component Value Range Status Comment   Specimen Description BLOOD HEMODIALYSIS CATHETER   Final    Special Requests BOTTLES DRAWN AEROBIC AND ANAEROBIC 10CC   Final    Culture  Setup Time 03/30/2012 01:45   Final    Culture     Final    Value:        BLOOD CULTURE RECEIVED NO GROWTH TO DATE CULTURE WILL BE HELD FOR 5 DAYS BEFORE ISSUING A FINAL NEGATIVE REPORT   Report Status PENDING   Incomplete   CULTURE, BLOOD (ROUTINE X 2)     Status: Normal (Preliminary result)   Collection Time   03/29/12  6:57 PM      Component Value Range Status Comment   Specimen Description BLOOD HEMODIALYSIS CATHETER   Final    Special Requests BOTTLES DRAWN AEROBIC AND ANAEROBIC 10CC   Final    Culture  Setup Time 03/30/2012 01:46   Final    Culture     Final    Value:        BLOOD CULTURE RECEIVED NO GROWTH TO DATE CULTURE WILL BE HELD FOR 5 DAYS BEFORE ISSUING A FINAL NEGATIVE REPORT   Report Status PENDING   Incomplete     Cliffton Asters, MD [facility] [phone] pager   [phone] cell 04/01/2012, 12:39 PM

## 2012-04-01 NOTE — Progress Notes (Signed)
EKG does not show atrial arrhythmia. Potassium is low therefore will supplement because of reported ventricular ectopy.  Delirium improved.  HD Tuesday and work toward prep for op hd. Bryan Gilmore

## 2012-04-01 NOTE — Progress Notes (Signed)
Utilization review completed.  

## 2012-04-01 NOTE — Progress Notes (Signed)
Nursing Note  Called into pt's room by secretary who stated this pt appeared to be very agitated on the cameras. Upon entering the pt's room, he was in the process of pulling at his HD catheter and had already removed his telemetry leads/box and abdominal dressing. The pt was very agitated and shouting that he was "being left here to die" and that he needed "the police to come and take me home." Security was called and arrived in the room. Pt was placed in bilateral soft wrist restraints and orders were received from Dr. Zetta Bills. Pt also rec'd IV ativan. Pt appeared more calm once restraints were in place. IV team notified of HD catheter needing a new dressing. Will continue to monitor and educate the pt on what he needs to do to have the restraints discontinued. C.Marlos Carmen, RN.

## 2012-04-01 NOTE — Progress Notes (Signed)
Subjective:  Opens eyes, follows commands and answers simple questions but remains confused; poor historian  Vital signs in last 24 hours: Filed Vitals:   03/31/12 1428 03/31/12 2200 04/01/12 0642 04/01/12 0954  BP: 94/64 107/65 100/58 112/70  Pulse: 114 116 102 98  Temp: 97.5 F (36.4 C) 98.3 F (36.8 C) 98.8 F (37.1 C) 98.4 F (36.9 C)  TempSrc: Axillary Oral Oral Oral  Resp: 18 20 20 20   Weight:  86 kg (189 lb 9.5 oz)    SpO2:  91% 100% 100%   Weight change: -1.4 kg (-3 lb 1.4 oz)  Intake/Output Summary (Last 24 hours) at 04/01/12 1054 Last data filed at 04/01/12 0900  Gross per 24 hour  Intake    120 ml  Output      0 ml  Net    120 ml   Labs: Basic Metabolic Panel:  Lab 04/01/12 2130 03/31/12 0526 03/30/12 0823  NA 138 138 145  K 3.1* 3.6 4.9  CL 99 98 102  CO2 25 26 25   GLUCOSE 100* 90 82  BUN 18 10 35*  CREATININE 5.24* 3.35* 6.81*  CALCIUM 9.5 9.6 9.8  ALB -- -- --  PHOS 4.6 4.0 5.9*   Liver Function Tests:  Lab 04/01/12 0600 03/31/12 0526 03/30/12 0823  AST -- 38* --  ALT -- 15 --  ALKPHOS -- 101 --  BILITOT -- 0.7 --  PROT -- 6.9 --  ALBUMIN 2.0* 2.1* 2.4*   No results found for this basename: LIPASE:3,AMYLASE:3 in the last 168 hours  Lab 03/30/12 0928  AMMONIA 15   CBC:  Lab 04/01/12 0600 03/31/12 0526 03/30/12 0823 03/30/12 0600  WBC 10.0 8.6 11.4* --  NEUTROABS -- -- -- --  HGB 8.4* 8.3* 8.5* --  HCT 25.5* 26.1* 26.6* --  MCV 98.5 98.9 98.5 100.4*  PLT 309 309 419* --   Cardiac Enzymes: No results found for this basename: CKTOTAL:5,CKMB:5,CKMBINDEX:5,TROPONINI:5 in the last 168 hours CBG: No results found for this basename: GLUCAP:5 in the last 168 hours  Iron Studies:  Basename 03/30/12 0823  IRON 20*  TIBC 255  TRANSFERRIN --  FERRITIN 1434*   Studies/Results: No results found. Medications:      . allopurinol  300 mg Oral Daily  . calcium acetate  1,334 mg Oral TID WC  . cefTAZidime (FORTAZ)  IV  2 g Intravenous Q  T,Th,Sa-HD  . Chlorhexidine Gluconate Cloth  6 each Topical Q0600  . darbepoetin (ARANESP) injection - DIALYSIS  100 mcg Intravenous Q Sat-HD  . feeding supplement (NEPRO CARB STEADY)  237 mL Oral TID WC  . ferric gluconate (FERRLECIT/NULECIT) IV  125 mg Intravenous Q T,Th,Sa-HD  . fluconazole  100 mg Oral Daily  . multivitamin  1 tablet Oral QHS  . mupirocin ointment   Nasal BID  . nebivolol  20 mg Oral QHS  . pantoprazole  40 mg Oral Q1200  . paricalcitol  2 mcg Intravenous 3 times weekly  . sodium chloride  3 mL Intravenous Q12H  . vancomycin  1,000 mg Intravenous Q T,Th,Sa-HD  . DISCONTD: clonazePAM  0.5 mg Oral Q12H  . DISCONTD: mirtazapine  15 mg Oral QHS  . DISCONTD: QUEtiapine  25 mg Oral TID    I  have reviewed scheduled and prn medications.  Physical Exam  General: comfortable, somnolent, NAD  Heart: irreg/irreg (Afib/flutter with multifocal PVC's rate 92 this am-7am on monitor)  Lungs: diminished bases, no rhonchi, rales, or wheezes at this time  G-U: foley intact, cloudy urine; wound not examined  Abdomen: colostomy large liquid stool  Extremities: no ankle edema  Dialysis Access: RIJ PC intact   Assessment/Plan:  1. Psych/AMS - sometime since being discharge to [facility], patient was started on multiple psych meds including Remeron, Seroquel and Klonopin; all psych meds held on adm; psychiatry seen; they suspect likely delirium on the top of significant cognitive deficits; improved now and psych has now signed off  2. ESRD - CLIP to [place]; TTS schedule; next HD tomorrow; started dialysis on recent admission for Fournier's gangrene- has new tunneled I-J cath placement by [facility]. No permanent access was placed during that admission due to infection concerns; defer permanent access at this time.  3. Hypokalemia- appetite poor, will continue to replete on HD and follow 4. Hypothyroidism-  ? TSH 6.222, not know PMH; follow 5. Hypertension/volume -controlled on Bystolic qhs  with parameters to hold; no volume issues 6. Tachycardic-Afib/Flutter on monitor with multifocal PVC's; unsure PMH; get 12-lead EKG; consider cardiology consult; SCD's for DVT prophylaxis 7. Anemia -Hgb now 8.4; new start Aranesp 100 mcg/wk; T-Sat 8 % and Ferritin 1434; IV Fe bolus 125 x 5; follow closely; plans transfuse <7.5 8. Metabolic bone disease - ca 9.5, phos 4.6, using 2.25 Ca bath; iPTH in June was 900s; continue current Phoslo and Zemplar 2 mcg IV with HD; follow  9. Fournier's gangrene- s/p I&D in June; has foley;urology following  10. Nutrition - Alb 2.0- severe malnutrition; intake poor; had swallowing study - D3 high protein renal (mech soft and thin liquids with supervision); nepro supplements tid; follow 11. Thrush- empiric oral diflucan x 7d 12. Leukoctyosis- mildly elevated WBC; improved on IV antbx (Vanc/Fortaz- since 7/20); some temp since adm (T-max 101); afebrile this am; Bld Cx NTD; urine suggestive of UTI; Perineal wound - no overt evidence of infection. Urology has evaluated; Dr. Orvan Falconer, ID following  13. HIV - ID Dr. Orvan Falconer following CD4. 180 6/8 14. MRSA + contact precautions - bactroban 15. Hep C + 16. Hx of EtOH abuse 17. Disposition- discharge planning - complex situation as it is unclear if he is appropriate for outpatient dialysis (competent to sign consent or will have family to accompany him) and whether or not [facility] will accept him [facility]. He has been cooperative since wrist restraints removed per staff; DNR/DNI   [name], FNP-C [facility] Pager [phone]  04/01/2012,10:54 AM  LOS: 3 days

## 2012-04-01 NOTE — Progress Notes (Signed)
Speech Language Pathology Dysphagia Treatment Patient Details Name: Bryan Gilmore MRN: [id number] DOB: Dec 30, 1950 Today's Date: 04/01/2012 Time: 1230-1300 SLP Time Calculation (min): 30 min  Assessment / Plan / Recommendation Clinical Impression  Purpose of diagnostic treatment for diet tolerance of dysphagia 3 consistency and thin liquids following initial BSE completed on 03/30/12.  Patient observed with noon meal and with trials of regular consistency and thin liquids administered by cup/straw. No observed s/s of aspiration noted  throughout treatment . Recommend to upgrade to regular consistency  with intermittent supervison with all meals. ST to sign off as education completed.     Diet Recommendation  Initiate / Change Diet: Regular;Thin liquid    SLP Plan Discharge SLP treatment due to (comment);Other (Comment) (all goals met. )      Swallowing Goals  SLP Swallowing Goals Swallow Study Goal #1 - Progress: Met Swallow Study Goal #2 - Progress: Met  General Temperature Spikes Noted: No Respiratory Status: Room air Behavior/Cognition: Alert;Cooperative;Pleasant mood Oral Cavity - Dentition: Missing dentition;Poor condition Patient Positioning: Upright in bed  Oral Cavity - Oral Hygiene Brush patient's teeth BID with toothbrush (using toothpaste with fluoride): Yes   Dysphagia Treatment Treatment focused on: Skilled observation of diet tolerance;Upgraded PO texture trials;Patient/family/caregiver education;Facilitation of oral preparatory phase;Utilization of compensatory strategies Treatment Methods/Modalities: Skilled observation;Differential diagnosis Patient observed directly with PO's: Yes Type of PO's observed: Regular;Dysphagia 3 (soft);Thin liquids Feeding: Able to feed self Liquids provided via: Cup;Straw Type of cueing: Verbal Amount of cueing: Minimal   Moreen Fowler MS, CCC-SLP [phone]     [facility] 04/01/2012, 2:00 PM

## 2012-04-01 NOTE — Consult Note (Signed)
WOC consult note  Requested for wound care and ostomy eval for supplies. Since time of consult request MD has written appropriate wound care orders.  Discussed with bedside nursing and pt needs only consult for ostomy supplies at this time. Currently using 1pc pouch for LLQ end colostomy.  I will order supplies for the bedside nursing to use.  With pts current status he has been confused and pulling off the pouch from time to time and will not be independent with ostomy care.  He will need assistance with this care from this time forward.  I have ordered 4 1pc CTF flat pouches to the room for use.    Re consult if needed, will not follow at this time. Thanks  Sarha Bartelt [employer], CWOCN [phone])

## 2012-04-02 ENCOUNTER — Encounter: Payer: Self-pay | Admitting: Vascular Surgery

## 2012-04-02 ENCOUNTER — Inpatient Hospital Stay (HOSPITAL_COMMUNITY): Payer: BC Managed Care – PPO

## 2012-04-02 LAB — URINALYSIS, ROUTINE W REFLEX MICROSCOPIC
Bilirubin Urine: NEGATIVE
Glucose, UA: NEGATIVE mg/dL
Ketones, ur: NEGATIVE mg/dL
Nitrite: NEGATIVE
Protein, ur: >300 mg/dL — AB
Specific Gravity, Urine: 1.011 (ref 1.005–1.030)
Urobilinogen, UA: 0.2 mg/dL (ref 0.0–1.0)
pH: 8.5 — ABNORMAL HIGH (ref 5.0–8.0)

## 2012-04-02 LAB — URINE MICROSCOPIC-ADD ON

## 2012-04-02 LAB — RENAL FUNCTION PANEL
Albumin: 2 g/dL — ABNORMAL LOW (ref 3.5–5.2)
BUN: 24 mg/dL — ABNORMAL HIGH (ref 6–23)
Creatinine, Ser: 6.71 mg/dL — ABNORMAL HIGH (ref 0.50–1.35)
Glucose, Bld: 109 mg/dL — ABNORMAL HIGH (ref 70–99)
Phosphorus: 3.3 mg/dL (ref 2.3–4.6)

## 2012-04-02 LAB — CBC
HCT: 23.7 % — ABNORMAL LOW (ref 39.0–52.0)
Hemoglobin: 7.8 g/dL — ABNORMAL LOW (ref 13.0–17.0)
MCH: 31.8 pg (ref 26.0–34.0)
MCHC: 32.9 g/dL (ref 30.0–36.0)
MCV: 96.7 fL (ref 78.0–100.0)
RDW: 19.6 % — ABNORMAL HIGH (ref 11.5–15.5)

## 2012-04-02 LAB — T-HELPER CELLS (CD4) COUNT (NOT AT ARMC): CD4 T Cell Abs: 720 uL (ref 400–2700)

## 2012-04-02 MED ORDER — PARICALCITOL 5 MCG/ML IV SOLN
INTRAVENOUS | Status: AC
  Filled 2012-04-02: qty 1

## 2012-04-02 MED ORDER — ALTEPLASE 2 MG IJ SOLR
4.0000 mg | Freq: Once | INTRAMUSCULAR | Status: AC
  Administered 2012-04-02: 4 mg
  Filled 2012-04-02 (×2): qty 4

## 2012-04-02 MED ORDER — ALTEPLASE 100 MG IV SOLR
2.0000 mg | Freq: Once | INTRAVENOUS | Status: AC
  Administered 2012-04-02: 2 mg

## 2012-04-02 NOTE — Progress Notes (Signed)
Sitter at bedside with pt. Pt calm but still fidgeting with IV and foley and trying to get out of bed. Pt able to be redirected and kept in bed with aid of sitter. Will continue to monitor. [place], Rosanna Randy

## 2012-04-02 NOTE — Procedures (Signed)
Tolerating hemodialysis.  Awake and responsive.  Answers most questions.  Restrained but does not understand why, but not upset about it.  Pulled at HD catheter last PM and required Ativan.  I am not sure we going to get him where his mental status will allow [place] to get him out patient safe dialysis.  Will ask for [facility] to reevaluate him. I attempted to call the daughter twice this AM. Zorianna Taliaferro C

## 2012-04-02 NOTE — Progress Notes (Signed)
04/02/12  I spoke at length with this patient's daughter about outpatient hd and needing a plan for discharge.  Dr Lowell Guitar stated that he will do a family meeting with the family.  Rexene Edison (the patient's daughter) [phone] said that Friday 04/05/12 at 230pm.  The family that cannot arrive d/t in [place] and [place] will obtain phone numbers for a conference call for the family that cannot be here.

## 2012-04-02 NOTE — Progress Notes (Signed)
Report received, pt not in restraints, sitter at bedside

## 2012-04-02 NOTE — Progress Notes (Signed)
Unable to examine patient in Dialysis unit Patient abnormal mentation Did not see a positive urine culture Leave Foley in Will follow short/long-term

## 2012-04-02 NOTE — Progress Notes (Signed)
Daughter extremely frustrated over care.  No one giving her "straight answers".  Unhappy about sedation.  I think we need a family conference.   Drake Wuertz C

## 2012-04-02 NOTE — Progress Notes (Signed)
ANTIBIOTIC CONSULT NOTE - FOLLOW UP  Pharmacy Consult for vancomycin Indication: Empiric  No Known Allergies  Patient Measurements: Height: 5\' 11"  (180.3 cm) Weight: 191 lb 9.3 oz (86.9 kg) IBW/kg (Calculated) : 75.3    Vital Signs: Temp: 99.2 F (37.3 C) (07/23 0745) Temp src: Oral (07/23 0745) BP: 115/70 mmHg (07/23 1230) Pulse Rate: 100  (07/23 1230) Intake/Output from previous day: 07/22 0701 - 07/23 0700 In: 460 [P.O.:460] Out: 0  Intake/Output from this shift:    Labs:  Basename 04/02/12 0750 04/01/12 0600 03/31/12 0526  WBC 12.6* 10.0 8.6  HGB 7.8* 8.4* 8.3*  PLT 354 309 309  LABCREA -- -- --  CREATININE 6.71* 5.24* 3.35*   Estimated Creatinine Clearance: 12.3 ml/min (by C-G formula based on Cr of 6.71). No results found for this basename: VANCOTROUGH:2,VANCOPEAK:2,VANCORANDOM:2,GENTTROUGH:2,GENTPEAK:2,GENTRANDOM:2,TOBRATROUGH:2,TOBRAPEAK:2,TOBRARND:2,AMIKACINPEAK:2,AMIKACINTROU:2,AMIKACIN:2, in the last 72 hours   Microbiology: Recent Results (from the past 720 hour(s))  SURGICAL PCR SCREEN     Status: Abnormal   Collection Time   03/29/12  7:45 AM      Component Value Range Status Comment   MRSA, PCR POSITIVE (*) NEGATIVE Final    Staphylococcus aureus POSITIVE (*) NEGATIVE Final   CULTURE, BLOOD (ROUTINE X 2)     Status: Normal (Preliminary result)   Collection Time   03/29/12  6:56 PM      Component Value Range Status Comment   Specimen Description BLOOD HEMODIALYSIS CATHETER   Final    Special Requests BOTTLES DRAWN AEROBIC AND ANAEROBIC 10CC   Final    Culture  Setup Time 03/30/2012 01:45   Final    Culture     Final    Value:        BLOOD CULTURE RECEIVED NO GROWTH TO DATE CULTURE WILL BE HELD FOR 5 DAYS BEFORE ISSUING A FINAL NEGATIVE REPORT   Report Status PENDING   Incomplete   CULTURE, BLOOD (ROUTINE X 2)     Status: Normal (Preliminary result)   Collection Time   03/29/12  6:57 PM      Component Value Range Status Comment   Specimen  Description BLOOD HEMODIALYSIS CATHETER   Final    Special Requests BOTTLES DRAWN AEROBIC AND ANAEROBIC 10CC   Final    Culture  Setup Time 03/30/2012 01:46   Final    Culture     Final    Value:        BLOOD CULTURE RECEIVED NO GROWTH TO DATE CULTURE WILL BE HELD FOR 5 DAYS BEFORE ISSUING A FINAL NEGATIVE REPORT   Report Status PENDING   Incomplete     Anti-infectives     Start     Dose/Rate Route Frequency Ordered Stop   04/02/12 1200   vancomycin (VANCOCIN) IVPB 1000 mg/200 mL premix        1,000 mg 200 mL/hr over 60 Minutes Intravenous Every T-Th-Sa (Hemodialysis) 03/30/12 0922     04/01/12 2200   Zidovudine (RETROVIR) 50 MG/5ML syrup 300 mg  Status:  Discontinued        300 mg Oral Daily at bedtime 04/01/12 1250 04/01/12 2119   04/01/12 2200   lamiVUDine (EPIVIR) 10 MG/ML solution 50 mg        50 mg Oral Daily at bedtime 04/01/12 1250     04/01/12 2200   zidovudine (RETROVIR) capsule 300 mg        300 mg Oral Daily at bedtime 04/01/12 2119     04/01/12 1700   lopinavir-ritonavir (KALETRA) 400-100 MG/5ML solution  400 mg        5 mL Oral 2 times daily with meals 04/01/12 1250     03/30/12 1500   vancomycin (VANCOCIN) 2,000 mg in sodium chloride 0.9 % 500 mL IVPB        2,000 mg 250 mL/hr over 120 Minutes Intravenous  Once 03/30/12 0919 03/30/12 1302   03/30/12 1200   cefTAZidime (FORTAZ) 2 g in dextrose 5 % 50 mL IVPB        2 g 100 mL/hr over 30 Minutes Intravenous Every T-Th-Sa (Hemodialysis) 03/30/12 0853     03/30/12 1100   fluconazole (DIFLUCAN) tablet 100 mg        100 mg Oral Daily 03/30/12 1027 04/06/12 0959   03/29/12 1700   lopinavir-ritonavir (KALETRA) 400-100 MG/5ML solution 400 mg  Status:  Discontinued        400 mg Oral 2 times daily with meals 03/29/12 1247 03/29/12 1411   03/29/12 1430   zidovudine (RETROVIR) capsule 300 mg  Status:  Discontinued        300 mg Oral Daily 03/29/12 1247 03/29/12 1411   03/29/12 0744   cefUROXime (ZINACEF) 1.5 g in dextrose  5 % 50 mL IVPB  Status:  Discontinued        1.5 g 100 mL/hr over 30 Minutes Intravenous 30 min pre-op 03/29/12 0744 03/29/12 1040          Assessment: Patient is a 61 y.o M on vancomycin day #3 for empiric coverage secondary to fever and Leukocytosis.  All cultures have been negative thus far.  HAART restarted per ID yesterday.  Patient received vancomycin 2gm load on 7/20 and 1gm after 4 hours of HD today   Goal of Therapy:  Pre-HD vancomycin level= 15-25  Plan:  1) Continue vancomycin 1gm after each HD  Bryan Gilmore P 04/02/2012,1:11 PM

## 2012-04-02 NOTE — Progress Notes (Signed)
Pt calm and cooperative. Family at bedside. Restraints discontinued. Pt remains calm and able to be redirected from pulling at IV and foley. Family to sit with pt. Will continue to monitor. [place], Rosanna Randy

## 2012-04-02 NOTE — Progress Notes (Signed)
Patient ID: Bryan Gilmore, male   DOB: 17-Mar-1951, 61 y.o.   MRN: [id number]    [facility] for Infectious Disease    Date of Admission:  03/29/2012           Day 4 and vancomycin, cefepime and fluconazole Active Problems:  Fever  HIV INFECTION  HEPATITIS C  ESRD (end stage renal disease)  Hyperkalemia  Secondary hyperparathyroidism (of renal origin)  Delirium      . allopurinol  300 mg Oral Daily  . alteplase  4 mg Intracatheter Once  . calcium acetate  1,334 mg Oral TID WC  . cefTAZidime (FORTAZ)  IV  2 g Intravenous Q T,Th,Sa-HD  . Chlorhexidine Gluconate Cloth  6 each Topical Q0600  . darbepoetin (ARANESP) injection - DIALYSIS  100 mcg Intravenous Q Sat-HD  . feeding supplement (NEPRO CARB STEADY)  237 mL Oral TID WC  . ferric gluconate (FERRLECIT/NULECIT) IV  125 mg Intravenous Q T,Th,Sa-HD  . fluconazole  100 mg Oral Daily  . lamiVUDine  50 mg Oral QHS  . lopinavir-ritonavir  5 mL Oral BID WC  . multivitamin  1 tablet Oral QHS  . mupirocin ointment   Nasal BID  . nebivolol  20 mg Oral QHS  . pantoprazole  40 mg Oral Q1200  . paricalcitol      . paricalcitol  2 mcg Intravenous 3 times weekly  . potassium chloride  40 mEq Oral Once  . sodium chloride  3 mL Intravenous Q12H  . vancomycin  1,000 mg Intravenous Q T,Th,Sa-HD  . zidovudine  300 mg Oral QHS  . DISCONTD: Zidovudine  300 mg Oral QHS     Objective: Temp:  [98.8 F (37.1 C)-100.2 F (37.9 C)] 99.2 F (37.3 C) (07/23 1415) Pulse Rate:  [88-112] 102  (07/23 1415) Resp:  [13-18] 16  (07/23 1415) BP: (99-131)/(52-85) 125/68 mmHg (07/23 1415) SpO2:  [98 %-100 %] 99 % (07/23 1415) Weight:  [86 kg (189 lb 9.5 oz)-86.9 kg (191 lb 9.3 oz)] 86.4 kg (190 lb 7.6 oz) (07/23 1350)  General:  still confused HIV 1 RNA Quant (copies/mL)  Date Value  02/18/2012 409811*  04/17/2011 16100*  04/17/2011 17400*  11/04/2010 <20 copies/mL      CD4 T Cell Abs (cmm)  Date Value  04/01/2012 720   02/17/2012 180*  04/17/2011 440       Lab Results Lab Results  Component Value Date   WBC 12.6* 04/02/2012   HGB 7.8* 04/02/2012   HCT 23.7* 04/02/2012   MCV 96.7 04/02/2012   PLT 354 04/02/2012    Lab Results  Component Value Date   CREATININE 6.71* 04/02/2012   BUN 24* 04/02/2012   NA 136 04/02/2012   K 3.7 04/02/2012   CL 99 04/02/2012   CO2 24 04/02/2012    Lab Results  Component Value Date   ALT 15 03/31/2012   AST 38* 03/31/2012   ALKPHOS 101 03/31/2012   BILITOT 0.7 03/31/2012      Microbiology: Recent Results (from the past 240 hour(s))  SURGICAL PCR SCREEN     Status: Abnormal   Collection Time   03/29/12  7:45 AM      Component Value Range Status Comment   MRSA, PCR POSITIVE (*) NEGATIVE Final    Staphylococcus aureus POSITIVE (*) NEGATIVE Final   CULTURE, BLOOD (ROUTINE X 2)     Status: Normal (Preliminary result)   Collection Time   03/29/12  6:56 PM  Component Value Range Status Comment   Specimen Description BLOOD HEMODIALYSIS CATHETER   Final    Special Requests BOTTLES DRAWN AEROBIC AND ANAEROBIC 10CC   Final    Culture  Setup Time 03/30/2012 01:45   Final    Culture     Final    Value:        BLOOD CULTURE RECEIVED NO GROWTH TO DATE CULTURE WILL BE HELD FOR 5 DAYS BEFORE ISSUING A FINAL NEGATIVE REPORT   Report Status PENDING   Incomplete   CULTURE, BLOOD (ROUTINE X 2)     Status: Normal (Preliminary result)   Collection Time   03/29/12  6:57 PM      Component Value Range Status Comment   Specimen Description BLOOD HEMODIALYSIS CATHETER   Final    Special Requests BOTTLES DRAWN AEROBIC AND ANAEROBIC 10CC   Final    Culture  Setup Time 03/30/2012 01:46   Final    Culture     Final    Value:        BLOOD CULTURE RECEIVED NO GROWTH TO DATE CULTURE WILL BE HELD FOR 5 DAYS BEFORE ISSUING A FINAL NEGATIVE REPORT   Report Status PENDING   Incomplete     Studies/Results: No results found.  Assessment: Recent CD4 count done yesterday is 720 showing that his immune system is relatively intact.  I don't think his recent fever his HIV related to is unclear what is the source. He has some low-grade fever today. If he does not go over 101 overnight I would consider stopping his empiric antibiotic therapy tomorrow.  Plan: 1.  consider stopping vancomycin, cefepime and fluconazole tomorrow 2. Continue current antiretroviral therapy  Cliffton Asters, MD [facility] for Infectious Disease [facility] [phone] pager   [phone] cell 04/02/2012, 4:09 PM

## 2012-04-02 NOTE — Progress Notes (Signed)
Pt's daughter called concerning pt's condition on previous night shift. Informed daughter of pt's agitation and restraint placement per note from night RN. Daughter upset that pt had received ativan, saying "he's not supposed to be on any of that". Daughter requested that MD call her to discuss pt's condition and she requested that medication be DC'd. Dr. Lowell Guitar, on the unit, informed of daughter's request to be called and ativan DC'd. No new orders received at this time. [place], Rosanna Randy

## 2012-04-03 ENCOUNTER — Ambulatory Visit: Payer: BC Managed Care – PPO | Admitting: Vascular Surgery

## 2012-04-03 LAB — HIV-1 RNA QUANT-NO REFLEX-BLD
HIV 1 RNA Quant: <20 {copies}/mL
HIV-1 RNA Quant, Log: <1.3 {Log}

## 2012-04-03 MED ORDER — NEPRO/CARBSTEADY PO LIQD
237.0000 mL | Freq: Three times a day (TID) | ORAL | Status: DC
  Administered 2012-04-03 – 2012-04-13 (×22): 237 mL via ORAL

## 2012-04-03 NOTE — Progress Notes (Signed)
Pt has safety sitter, pt increasingly agitated, difficult to redirect, pulling at tubes and catheter. Pt daughter notified, Requested daughter to provide family member or some one to sit with patient due to current sitter leaving staffing at 0300 with no other staff  Available for one on one supervision. Daughter states there is no family or friends to assist in care for him here. Daughter does not want pt is restraints and does not want pt to receive any medications for agitation. Daughter advises she wants "pt to be placed in wheelchair and taken on tour of hospital and.or for a ride off the unit because she feels patient is just bored with his current scenery and that would allow him to have something to do". Daughter was advised the hospital does not have staffing available for this, and the patient is on telemetry monitoring, pt becomes agitated and can pose a safety hazard to one staff member off the unit with patient with out other resource assistance near by, and the patient is currently on Contact Precautions which poses an additional safety issue to other patients/visitors/staff if the patient becomes agitated and pulls out lines/drains or injures someone in an agitated state. Charge RN aware of situation and House coverage notified

## 2012-04-03 NOTE — Progress Notes (Signed)
Pt returned to bed with assistance, pt restrained with soft wrist restraints, bed rails up x 4, bed alarm on, camera in room on. "Pt states he wants to get some sleep" no needs expressed, pt call bell in reach

## 2012-04-03 NOTE — Clinical Social Work Psychosocial (Signed)
Clinical Social Work Department BRIEF PSYCHOSOCIAL ASSESSMENT 04/03/2012  Patient:  Bryan Gilmore, Bryan Gilmore     Account Number:  [id number]     Admit date:  03/29/2012  Clinical Social Worker:  Delmer Islam  Date/Time:  04/01/2012 04:30 AM  Referred by:  Physician  Date Referred:  03/29/2012 Referred for  Other - See comment  SNF Placement   Other Referral:   Referral regarding patient not being able to return to SNF until he can have dialysis treatment outpatient and can sign for treatments or have someone sign for him.   Interview type:  Family Other interview type:   CSW talked with daughter at the bedside    PSYCHOSOCIAL DATA Living Status:  ALONE Admitted from facility:   Level of care:   Primary support name:   Primary support relationship to patient:   Degree of support available:   Patient daughter Jorey Dollard lives in [place] and came down to see her father. Patient has an aunt Tyler Aas and cousin Antoinio who live in [place].  Per daughter aunt and cousin are supportive.    CURRENT CONCERNS Current Concerns  Post-Acute Placement  Other - See comment   Other Concerns:   Concerns pertain to patient being able to have dialysis treatments outpatient. He is currently having to be dialized in restraints as he has pulled out his dialysis catheter. As this issue is being resolved CSW will be seeking SNF placement.    SOCIAL WORK ASSESSMENT / PLAN On 04/01/12 CSW talked with Ms. Rickett-patient's daughter regarding patient's illness, her concerns about his current health status, concerns about [facility] and discharge planning.    Ms. Mcferran stated that she does not want patient to return to [facility], however CSW given the go-ahead to pursue SNF placement and communicate with patient's aunt and cousin regarding facility choices. Patient also asked to be kept up-to-date on SNF search.   Assessment/plan status:  Psychosocial Support/Ongoing Assessment of Needs Other  assessment/ plan:   Information/referral to community resources:    PATIENT'S/FAMILY'S RESPONSE TO PLAN OF CARE: Patient greeted CSW but did not participate in discussion about discharge planning. Daughter very concrned about her her father's well-being and has questions for the doctor about his current meds and his behaviors. Ms. Dahlen advised CSW that she would be leaving on the 23rd to return home, but wants to keep in touch with CSW regarding discharge planning.

## 2012-04-03 NOTE — Progress Notes (Signed)
Patient ID: Bryan Gilmore, male   DOB: 01/08/1951, 61 y.o.   MRN: [id number]    [facility] for Infectious Disease    Date of Admission:  03/29/2012           Day 5 vancomycin        Day 5 cefepime Active Problems:  Fever  HIV INFECTION  HEPATITIS C  ESRD (end stage renal disease)  Hyperkalemia  Secondary hyperparathyroidism (of renal origin)  Delirium      . allopurinol  300 mg Oral Daily  . calcium acetate  1,334 mg Oral TID WC  . Chlorhexidine Gluconate Cloth  6 each Topical Q0600  . darbepoetin (ARANESP) injection - DIALYSIS  100 mcg Intravenous Q Sat-HD  . feeding supplement (NEPRO CARB STEADY)  237 mL Oral TID BM  . ferric gluconate (FERRLECIT/NULECIT) IV  125 mg Intravenous Q T,Th,Sa-HD  . lamiVUDine  50 mg Oral QHS  . lopinavir-ritonavir  5 mL Oral BID WC  . multivitamin  1 tablet Oral QHS  . mupirocin ointment   Nasal BID  . nebivolol  20 mg Oral QHS  . pantoprazole  40 mg Oral Q1200  . paricalcitol  2 mcg Intravenous 3 times weekly  . sodium chloride  3 mL Intravenous Q12H  . zidovudine  300 mg Oral QHS  . DISCONTD: cefTAZidime (FORTAZ)  IV  2 g Intravenous Q T,Th,Sa-HD  . DISCONTD: feeding supplement (NEPRO CARB STEADY)  237 mL Oral TID WC  . DISCONTD: fluconazole  100 mg Oral Daily  . DISCONTD: vancomycin  1,000 mg Intravenous Q T,Th,Sa-HD    Subjective: He denies complaints.  Objective: Temp:  [97.4 F (36.3 C)-99.6 F (37.6 C)] 97.4 F (36.3 C) (07/24 1340) Pulse Rate:  [67-104] 87  (07/24 1340) Resp:  [20] 20  (07/24 1340) BP: (99-136)/(62-90) 136/90 mmHg (07/24 1340) SpO2:  [98 %-100 %] 98 % (07/24 1340) Weight:  [86.4 kg (190 lb 7.6 oz)] 86.4 kg (190 lb 7.6 oz) (07/23 2115)  General: He is sitting up watching TV and feeding himself Skin: No rash Lungs: Clear Cor: Regular S1 and S2 with no murmur  Lab Results Lab Results  Component Value Date   WBC 12.6* 04/02/2012   HGB 7.8* 04/02/2012   HCT 23.7* 04/02/2012   MCV 96.7 04/02/2012   PLT  354 04/02/2012    Lab Results  Component Value Date   CREATININE 6.71* 04/02/2012   BUN 24* 04/02/2012   NA 136 04/02/2012   K 3.7 04/02/2012   CL 99 04/02/2012   CO2 24 04/02/2012    Lab Results  Component Value Date   ALT 15 03/31/2012   AST 38* 03/31/2012   ALKPHOS 101 03/31/2012   BILITOT 0.7 03/31/2012    HIV 1 RNA Quant (copies/mL)  Date Value  04/01/2012 <20   02/18/2012 409811*  04/17/2011 16100*  04/17/2011 17400*     CD4 T Cell Abs (cmm)  Date Value  04/01/2012 720   02/17/2012 180*  04/17/2011 440       Microbiology: Recent Results (from the past 240 hour(s))  SURGICAL PCR SCREEN     Status: Abnormal   Collection Time   03/29/12  7:45 AM      Component Value Range Status Comment   MRSA, PCR POSITIVE (*) NEGATIVE Final    Staphylococcus aureus POSITIVE (*) NEGATIVE Final   CULTURE, BLOOD (ROUTINE X 2)     Status: Normal (Preliminary result)   Collection Time   03/29/12  6:56 PM      Component Value Range Status Comment   Specimen Description BLOOD HEMODIALYSIS CATHETER   Final    Special Requests BOTTLES DRAWN AEROBIC AND ANAEROBIC 10CC   Final    Culture  Setup Time 03/30/2012 01:45   Final    Culture     Final    Value:        BLOOD CULTURE RECEIVED NO GROWTH TO DATE CULTURE WILL BE HELD FOR 5 DAYS BEFORE ISSUING A FINAL NEGATIVE REPORT   Report Status PENDING   Incomplete   CULTURE, BLOOD (ROUTINE X 2)     Status: Normal (Preliminary result)   Collection Time   03/29/12  6:57 PM      Component Value Range Status Comment   Specimen Description BLOOD HEMODIALYSIS CATHETER   Final    Special Requests BOTTLES DRAWN AEROBIC AND ANAEROBIC 10CC   Final    Culture  Setup Time 03/30/2012 01:46   Final    Culture     Final    Value:        BLOOD CULTURE RECEIVED NO GROWTH TO DATE CULTURE WILL BE HELD FOR 5 DAYS BEFORE ISSUING A FINAL NEGATIVE REPORT   Report Status PENDING   Incomplete    Assessment: There is no obvious source for his recent fever but he seems to have improved  and cultures are negative. I agree with stopping empiric antibiotic therapy. His HIV infection is under excellent control. I will continue his current antiretroviral regimen.  Plan: 1. Agree with discontinuing empiric antibiotics 2. Continue current antiretroviral regimen 3. He has a followup visit scheduled in our clinic next Tuesday with Dr. Jerolyn Center. If he is still hospitalized this appointment will need to be changed. Please call me if I can be of further assistance while he is here.  Cliffton Asters, MD [facility] for Infectious Disease [facility] [phone] pager   [phone] cell 04/03/2012, 5:19 PM

## 2012-04-03 NOTE — Evaluation (Signed)
Occupational Therapy Evaluation Patient Details Name: Bryan Gilmore MRN: [id number] DOB: [birth year] Today's Date: 04/03/2012 Time: 4098-1191 OT Time Calculation (min): 22 min  OT Assessment / Plan / RecommendationHenry PIKE SCANTLEBURY is a 61 y.o. male with ESRD (started during June 2013 admission )on HD who was discharged from [facility] (6/21 - 03/26/2012 admission) to [facility] where he was treated following sepsis secondary to Fournier's gangrene s/p debridement. During the [facility] admission, he also had a Hartman procedure with end colostomy and umbilical hernia repair due to soilage of wound with incontinence and diarrhea.  Pt was admitted with hyperkalemia and having pulled out his dialysis graft.  Unclear what pt's PLOF was PTA as he is confused, no family available, and unable to reach caregivers at [facility]  Will defer OT to SNF.  Recommend OOB and for nursing to encourage pt's participation in ADL.  Clinical Impression      OT Assessment  All further OT needs can be met in the next venue of care    Follow Up Recommendations  Skilled nursing facility    Barriers to Discharge      Equipment Recommendations  Defer to next venue    Recommendations for Other Services    Frequency       Precautions / Restrictions Precautions Precautions: Fall Precaution Comments: Scrotal wound, colostomy Restrictions Weight Bearing Restrictions: No   Pertinent Vitals/Pain No pain   ADL  Eating/Feeding: Simulated;Other (comment);+1 Total assistance (sitter reports feeding pt, pt also has been restrained) Where Assessed - Eating/Feeding: Bed level Grooming: Performed;Wash/dry face;Set up Where Assessed - Grooming: Supported sitting Upper Body Bathing: Simulated;Moderate assistance Where Assessed - Upper Body Bathing: Unsupported sitting Lower Body Bathing: Simulated;+1 Total assistance Where Assessed - Lower Body Bathing: Unsupported sitting;Supported sit to stand Upper Body Dressing:  Simulated;Moderate assistance Where Assessed - Upper Body Dressing: Unsupported sitting Lower Body Dressing: Performed;+1 Total assistance Where Assessed - Lower Body Dressing: Unsupported sitting;Supported sit to stand Equipment Used: Gait belt;Rolling walker Transfers/Ambulation Related to ADLs: Pt used walker to make 2 turning steps with min assist, second person for safety.  Verbal cues for technique.    OT Diagnosis:    OT Problem List:   OT Treatment Interventions:     OT Goals    Visit Information  Last OT Received On: 04/03/12 Assistance Needed: +2 PT/OT Co-Evaluation/Treatment: Yes    Subjective Data  Subjective: "You see this scaly skin, it is from the car!"   Prior Functioning  Vision/Perception  Home Living Available Help at Discharge: Skilled Nursing Facility Type of Home: Skilled Nursing Facility (pt is a resident of [facility]) Additional Comments: Attempted to gain information on pt's PLOF from [facility] staff without success. Communication Communication: No difficulties Dominant Hand: Right      Cognition  Overall Cognitive Status: Impaired (no family available to determine PLOF) Area of Impairment: Attention;Memory;Safety/judgement;Awareness of deficits;Problem solving Arousal/Alertness: Awake/alert Orientation Level: Person Behavior During Session: Other (comment) (confused, off topic conversation, needs redirection) Current Attention Level: Focused Memory: Decreased recall of precautions Memory Deficits: Pt unable to state PLOF.  Repeatedly referred to a "car" being responsible for his medical issues. Safety/Judgement: Decreased safety judgement for tasks assessed;Decreased awareness of need for assistance Safety/Judgement - Other Comments: Pt is restrained--pulls at lines and ostomy bag    Extremity/Trunk Assessment Right Upper Extremity Assessment RUE ROM/Strength/Tone: [facility] for tasks assessed Left Upper Extremity Assessment LUE  ROM/Strength/Tone: [facility] for tasks assessed   Mobility Bed Mobility Bed Mobility: Rolling Right;Right Sidelying to  Sit;Sitting - Scoot to [place] of Bed Rolling Right: 4: Min assist;With rail Right Sidelying to Sit: 4: Min assist;HOB elevated;With rails Sitting - Scoot to Edge of Bed: 5: Supervision Transfers Transfers: Sit to Stand;Stand to Sit Sit to Stand: 1: +2 Total assist;From bed Sit to Stand: Patient Percentage: 60% Stand to Sit: 1: +2 Total assist;With armrests;To chair/3-in-1 Stand to Sit: Patient Percentage: 60% (guidance for hand placement, control of descent.)   Exercise    Balance Balance Balance Assessed: Yes Static Sitting Balance Static Sitting - Balance Support: Feet supported Static Sitting - Level of Assistance: 5: Stand by assistance Static Sitting - Comment/# of Minutes: 5  End of Session OT - End of Session Activity Tolerance: Patient tolerated treatment well Patient left: in chair;with call bell/phone within reach;Other (comment) (sitter in room)  GO     Evern Bio 04/03/2012, 9:40 AM [phone]

## 2012-04-03 NOTE — Progress Notes (Addendum)
Pt agitated, attempted to contact daughter voice mail left. Pt remains at nursing station for safety. Pt states he wants to go to bed, he would "take the tie down rather than stay up here"

## 2012-04-03 NOTE — Progress Notes (Signed)
Pt daughter spoke with patient and then to charge RN via phone. Daughter agreeable to having restraint order implemented for safety, Dr Kathrene Bongo contacted, order for restraints received, wrist restraints  AND 4 side rails up

## 2012-04-03 NOTE — Progress Notes (Signed)
Pt up to chair per daughter's wishes. Pt moved to nursing station for safety

## 2012-04-03 NOTE — Evaluation (Addendum)
Physical Therapy Evaluation Patient Details Name: Bryan Gilmore MRN: [id number] DOB: 06-Apr-1951 Today's Date: 04/03/2012 Time: 4098-1191 PT Time Calculation (min): 23 min  PT Assessment / Plan / Recommendation Clinical Impression  Pt. was admitted with hyperkcalemia , having pulled  Out his dialysis catheter,  ESRD, and h/o HIV, hep C, fournier's gangrene and impaired mental status.  He was at [facility] then admitted to  [facility]  last week.  He will benefit from PT in the acute setting to continue to maximize his strength, safety and mobility.  Will need SNF at time of DC from acute.    PT Assessment  Patient needs continued PT services    Follow Up Recommendations  Skilled nursing facility    Barriers to Discharge None      Equipment Recommendations  Defer to next venue    Recommendations for Other Services     Frequency Min 3X/week    Precautions / Restrictions Precautions Precautions: Fall Precaution Comments: Scrotal wound, colostomy Restrictions Weight Bearing Restrictions: No   Pertinent Vitals/Pain No c/o pain      Mobility  Bed Mobility Bed Mobility: Supine to Sit;Sit to Supine Rolling Right: 4: Min assist;With rail Right Sidelying to Sit: 4: Min assist;HOB elevated;With rails Supine to Sit: 4: Min assist;With rails Sitting - Scoot to Edge of Bed: 5: Supervision Sit to Supine: Not Tested (comment) Details for Bed Mobility Assistance: Pt. needed assist and close monitoring due to pulling tubes earlier.assist and guidance for safety.   Transfers Transfers: Sit to Stand;Stand to Sit Sit to Stand: 1: +2 Total assist;From bed Sit to Stand: Patient Percentage: 60% Stand to Sit: 1: +2 Total assist;With armrests;To chair/3-in-1 Stand to Sit: Patient Percentage: 60% Details for Transfer Assistance: cues for safety and hand placement; tends to grab at walker with both hands Ambulation/Gait Ambulation/Gait Assistance: 1: +2 Total assist Ambulation/Gait: Patient  Percentage: 60% Ambulation Distance (Feet): 2 Feet Assistive device: Rolling walker Ambulation/Gait Assistance Details: Pt. needed manual guidance for balance, safety and stability Gait Pattern: Step-to pattern;Trunk flexed    Exercises     PT Diagnosis: Difficulty walking;Generalized weakness  PT Problem List: Decreased strength;Decreased activity tolerance;Decreased balance;Decreased mobility;Decreased cognition;Decreased knowledge of use of DME;Decreased safety awareness;Decreased knowledge of precautions PT Treatment Interventions:     PT Goals Acute Rehab PT Goals PT Goal Formulation: Patient unable to participate in goal setting Time For Goal Achievement: 04/17/12 Potential to Achieve Goals: Good Pt will go Supine/Side to Sit: with supervision PT Goal: Supine/Side to Sit - Progress: Goal set today Pt will go Sit to Supine/Side: with supervision PT Goal: Sit to Supine/Side - Progress: Goal set today Pt will go Sit to Stand: with min assist PT Goal: Sit to Stand - Progress: Goal set today Pt will go Stand to Sit: with min assist PT Goal: Stand to Sit - Progress: Goal set today Pt will Transfer Bed to Chair/Chair to Bed: with min assist PT Transfer Goal: Bed to Chair/Chair to Bed - Progress: Goal set today Pt will Ambulate: 51 - 150 feet;with min assist;with least restrictive assistive device PT Goal: Ambulate - Progress: Goal set today  Visit Information  Last PT Received On: 04/03/12 Assistance Needed: +2 PT/OT Co-Evaluation/Treatment: Yes    Subjective Data  Subjective: See the scales om my legs.  It's because of that car. Patient Stated Goal: pt. not able to provide goals.  Agreeable with mobilizing with PT/OT.   Prior Functioning  Home Living Available Help at Discharge: Skilled Nursing Facility Type of  Home: Skilled Nursing Facility Additional Comments: Call made to [facility] to discover pt.'s PLOF.  Unable to be connected to appropriate staff. Prior  Function Level of Independence: Needs assistance Needs Assistance: Transfers;Gait Communication Communication: No difficulties Dominant Hand: Right    Cognition  Overall Cognitive Status: Impaired Area of Impairment: Attention;Memory;Safety/judgement;Awareness of deficits;Problem solving Arousal/Alertness: Awake/alert Orientation Level: Person Behavior During Session: Other (comment) (pt. needed to be redirected and refocused) Current Attention Level: Focused Memory: Decreased recall of precautions Memory Deficits: Pt unable to state PLOF.  Repeatedly referred to a "car" being responsible for his medical issues. Safety/Judgement: Decreased safety judgement for tasks assessed;Decreased awareness of need for assistance Safety/Judgement - Other Comments: Pt is restrained--pulls at lines and ostomy bag    Extremity/Trunk Assessment Right Upper Extremity Assessment RUE ROM/Strength/Tone: [facility] for tasks assessed Left Upper Extremity Assessment LUE ROM/Strength/Tone: [facility] for tasks assessed Right Lower Extremity Assessment RLE ROM/Strength/Tone: Deficits;Unable to fully assess;Due to impaired cognition RLE ROM/Strength/Tone Deficits: generalized weakness evident, but unable to MMT due to his cognition Left Lower Extremity Assessment LLE ROM/Strength/Tone: Deficits;Unable to fully assess;Due to impaired cognition LLE ROM/Strength/Tone Deficits: generalized weakness noted Trunk Assessment Trunk Assessment: Normal   Balance Balance Balance Assessed: Yes Static Sitting Balance Static Sitting - Balance Support: Feet supported Static Sitting - Level of Assistance: 5: Stand by assistance Static Sitting - Comment/# of Minutes: 3  End of Session PT - End of Session Equipment Utilized During Treatment: Gait belt Activity Tolerance: Patient tolerated treatment well;Patient limited by fatigue;Other (comment) (limited by cognition) Patient left: in chair;with call bell/phone within reach;with  restraints reapplied;Other (comment) (wrist restraints; pt. with sitter Geraldine Contras) Nurse Communication: Mobility status  GP     Ferman Hamming 04/03/2012, 11:43 AM Acute Rehabilitation Services [phone] (pager)

## 2012-04-03 NOTE — Progress Notes (Signed)
Subjective:  Confused; sitting in recliner; thinks he's in [place]; awake in hospital and can tell me his full name and date of birth; restraints on but released with sitter @ bedside  Vital signs in last 24 hours: Filed Vitals:   04/02/12 1800 04/02/12 2115 04/03/12 0525 04/03/12 0900  BP: 107/72 99/62 117/75 109/72  Pulse: 104 77 67 84  Temp: 99.6 F (37.6 C) 98.3 F (36.8 C) 97.5 F (36.4 C) 99 F (37.2 C)  TempSrc: Oral Oral Oral Oral  Resp: 20 20 20 20   Height:  5\' 11"  (1.803 m)    Weight:  86.4 kg (190 lb 7.6 oz)    SpO2:  100% 98% 98%   Weight change: 0.9 kg (1 lb 15.8 oz)  Intake/Output Summary (Last 24 hours) at 04/03/12 1007 Last data filed at 04/03/12 0840  Gross per 24 hour  Intake    240 ml  Output   -871 ml  Net   1111 ml   Labs: Basic Metabolic Panel:  Lab 04/02/12 4098 04/01/12 0600 03/31/12 0526  NA 136 138 138  K 3.7 3.1* 3.6  CL 99 99 98  CO2 24 25 26   GLUCOSE 109* 100* 90  BUN 24* 18 10  CREATININE 6.71* 5.24* 3.35*  CALCIUM 9.3 9.5 9.6  ALB -- -- --  PHOS 3.3 4.6 4.0   Liver Function Tests:  Lab 04/02/12 0750 04/01/12 0600 03/31/12 0526  AST -- -- 38*  ALT -- -- 15  ALKPHOS -- -- 101  BILITOT -- -- 0.7  PROT -- -- 6.9  ALBUMIN 2.0* 2.0* 2.1*   No results found for this basename: LIPASE:3,AMYLASE:3 in the last 168 hours  Lab 03/30/12 0928  AMMONIA 15   CBC:  Lab 04/02/12 0750 04/01/12 0600 03/31/12 0526 03/30/12 0823 03/30/12 0600  WBC 12.6* 10.0 8.6 -- --  NEUTROABS -- -- -- -- --  HGB 7.8* 8.4* 8.3* -- --  HCT 23.7* 25.5* 26.1* -- --  MCV 96.7 98.5 98.9 98.5 100.4*  PLT 354 309 309 -- --   Cardiac Enzymes: No results found for this basename: CKTOTAL:5,CKMB:5,CKMBINDEX:5,TROPONINI:5 in the last 168 hours CBG: No results found for this basename: GLUCAP:5 in the last 168 hours  Iron Studies: No results found for this basename: IRON,TIBC,TRANSFERRIN,FERRITIN in the last 72 hours Studies/Results: No results  found. Medications:      . allopurinol  300 mg Oral Daily  . alteplase  2 mg Intracatheter Once  . calcium acetate  1,334 mg Oral TID WC  . cefTAZidime (FORTAZ)  IV  2 g Intravenous Q T,Th,Sa-HD  . Chlorhexidine Gluconate Cloth  6 each Topical Q0600  . darbepoetin (ARANESP) injection - DIALYSIS  100 mcg Intravenous Q Sat-HD  . feeding supplement (NEPRO CARB STEADY)  237 mL Oral TID WC  . ferric gluconate (FERRLECIT/NULECIT) IV  125 mg Intravenous Q T,Th,Sa-HD  . fluconazole  100 mg Oral Daily  . lamiVUDine  50 mg Oral QHS  . lopinavir-ritonavir  5 mL Oral BID WC  . multivitamin  1 tablet Oral QHS  . mupirocin ointment   Nasal BID  . nebivolol  20 mg Oral QHS  . pantoprazole  40 mg Oral Q1200  . paricalcitol      . paricalcitol  2 mcg Intravenous 3 times weekly  . sodium chloride  3 mL Intravenous Q12H  . vancomycin  1,000 mg Intravenous Q T,Th,Sa-HD  . zidovudine  300 mg Oral QHS    I  have  reviewed scheduled and prn medications.  Physical Exam  General: comfortable, NAD  Heart: normal S1,S2 (suspect in and out Afib/flutter)  Lungs: diminished bases, no rhonchi, rales, or wheezes at this time  G-U: foley intact, cloudy urine; wound not examined  Abdomen: colostomy large liquid stool  Extremities: no ankle edema  Dialysis Access: RIJ PC intact   Assessment/Plan:  1. Psych/AMS - confused and agitated at times requiring a sitter; was seen by Psych; signed off as they felt his delirium was likely secondary to significant cognitive deficits; if no definite metabolic cause identified, may need to reconsult psych; family conference pending  2.  Fournier's gangrene 3. ESRD - CLIP to [place] if and when determined stable for outpt HD; continue TTS schedule with plans for HD tomorrow; cont using RIJ PC (placed by VVS); will also need permanent access some time in the near future (not placed due to infection)  4. Hypokalemia- K 3.7, appetite poor, will continue to replete on HD and  follow 5. Hypothyroidism- ? TSH 6.222, not know PMH; follow 6. Hypertension/volume -controlled on Bystolic qhs with parameters to hold; no volume issues 7. Tachycardic-suspect in and out of Afib/Flutter as freq noted on monitor; keep on telemetry; SCD's for DVT prophylaxis 8. Anemia -Hgb still trending down; now 7.8; on Aranesp 100 mcg/wk and Iron Bolus; follow closely; plans transfuse <7.5 9. Metabolic bone disease - ca 9.3, phos 3.3, on Phoslo and Zemplar 2 mcg IV with HD; follow  10. Fournier's gangrene- s/p I&D in June; has foley;urology following  11. Nutrition - Alb 2.0 (has proteinuria), phos 3.3; - severe malnutrition; intake poor; on D3 high protein renal (mech soft and thin liquids with supervision); encourage nepro supplements tid; follow 12. Thrush- empiric tx with oral diflucan x 7d 13. Leukoctyosis- On emperic antibiotic therapy.  Will stop antibiotics per rec of I.D. 14. HIV - ID Dr. Orvan Falconer following CD4. 180 6/8 15. MRSA + contact precautions - bactroban 16. Hep C +/Hx of EtOH abuse 17. Disposition- discharge planning - complex situation as it is unclear if he is appropriate for outpatient dialysis; DNR/DNI; family conference pending   Bryan Germany, FNP-C [facility] Pager [phone]  04/03/2012,10:07 AM  LOS: 5 days   As above. Will stop antibiotics given absence of obvious source of infection.  Confusion persists. For HD in AM. Family conference Friday. Bryan Gilmore C

## 2012-04-04 ENCOUNTER — Inpatient Hospital Stay (HOSPITAL_COMMUNITY): Payer: BC Managed Care – PPO

## 2012-04-04 DIAGNOSIS — I4891 Unspecified atrial fibrillation: Secondary | ICD-10-CM

## 2012-04-04 DIAGNOSIS — N186 End stage renal disease: Secondary | ICD-10-CM

## 2012-04-04 DIAGNOSIS — I517 Cardiomegaly: Secondary | ICD-10-CM

## 2012-04-04 DIAGNOSIS — I4821 Permanent atrial fibrillation: Secondary | ICD-10-CM | POA: Diagnosis present

## 2012-04-04 LAB — RENAL FUNCTION PANEL
BUN: 27 mg/dL — ABNORMAL HIGH (ref 6–23)
CO2: 27 mEq/L (ref 19–32)
Calcium: 9.3 mg/dL (ref 8.4–10.5)
Glucose, Bld: 106 mg/dL — ABNORMAL HIGH (ref 70–99)
Phosphorus: 2.5 mg/dL (ref 2.3–4.6)

## 2012-04-04 LAB — CBC
HCT: 21.6 % — ABNORMAL LOW (ref 39.0–52.0)
Hemoglobin: 7 g/dL — ABNORMAL LOW (ref 13.0–17.0)
MCH: 31.5 pg (ref 26.0–34.0)
MCHC: 32.4 g/dL (ref 30.0–36.0)
MCV: 97.3 fL (ref 78.0–100.0)
RBC: 2.22 MIL/uL — ABNORMAL LOW (ref 4.22–5.81)

## 2012-04-04 MED ORDER — PARICALCITOL 5 MCG/ML IV SOLN
INTRAVENOUS | Status: AC
  Administered 2012-04-04: 2 ug via INTRAVENOUS
  Filled 2012-04-04: qty 1

## 2012-04-04 MED ORDER — DEXTROSE 5 % IV SOLN
1.0000 g | INTRAVENOUS | Status: DC
  Administered 2012-04-05: 1 g via INTRAVENOUS
  Filled 2012-04-04 (×2): qty 1

## 2012-04-04 MED ORDER — VANCOMYCIN HCL IN DEXTROSE 1-5 GM/200ML-% IV SOLN
1000.0000 mg | INTRAVENOUS | Status: DC
  Administered 2012-04-04: 1000 mg via INTRAVENOUS
  Filled 2012-04-04: qty 200

## 2012-04-04 NOTE — Progress Notes (Signed)
Patient alert and oriented only to self. Vital signs are stable.  Patient denies pain.  Daily dressing changes performed at two sites; 1) s/p surgical debridement to the scrotum and perineum, 2) superior dehisced portion of abdominal midline incision r/t colostomy placement.  At site 1), the underlying tissue remains pink and granulated but there is increased presence of purulent drainage from wound.  New wet-to-dry dressing was placed.  There are several areas of new skin breakdown around the base of the penis, in addition there is new scant purulent drainage from the tip of the penis/ around the catheter.  Catheter care was provided.  The attending Urologist was notified of the above findings.  He stated that he will round on the patient tomorrow morning.  At site 2), the wound continues to show purulent drainage with exudate covering part of the wound bed.  New wet-to-dry packing was placed.  Surgical staples used for the initial surgical closure remain along entire length of abdominal incision.  The attending doctor was made aware of the presence of the staples.  No new orders were given.  M.D. to call PA for surgical consult.  Will continue to monitor patient and do daily dressing changes.

## 2012-04-04 NOTE — Progress Notes (Addendum)
ANTIBIOTIC CONSULT NOTE - INITIAL  Pharmacy Consult for vancomycin Indication: hx of Fournier's gangrene and now still spiking fever  No Known Allergies  Patient Measurements: Height: 5\' 11"  (180.3 cm) Weight: 190 lb 11.2 oz (86.5 kg) IBW/kg (Calculated) : 75.3  Adjusted Body Weight:   Vital Signs: Temp: 101.1 F (38.4 C) (07/25 2049) Temp src: Oral (07/25 2049) BP: 112/74 mmHg (07/25 2049) Pulse Rate: 82  (07/25 2049) Intake/Output from previous day: 07/24 0701 - 07/25 0700 In: 840 [P.O.:600] Out: 225 [Urine:225] Intake/Output from this shift:    Labs:  Basename 04/04/12 0719 04/04/12 0718 04/02/12 0750  WBC -- 10.1 12.6*  HGB -- 7.0* 7.8*  PLT -- 308 354  LABCREA -- -- --  CREATININE 5.76* -- 6.71*   Estimated Creatinine Clearance: 14.3 ml/min (by C-G formula based on Cr of 5.76). No results found for this basename: VANCOTROUGH:2,VANCOPEAK:2,VANCORANDOM:2,GENTTROUGH:2,GENTPEAK:2,GENTRANDOM:2,TOBRATROUGH:2,TOBRAPEAK:2,TOBRARND:2,AMIKACINPEAK:2,AMIKACINTROU:2,AMIKACIN:2, in the last 72 hours   Microbiology: Recent Results (from the past 720 hour(s))  SURGICAL PCR SCREEN     Status: Abnormal   Collection Time   03/29/12  7:45 AM      Component Value Range Status Comment   MRSA, PCR POSITIVE (*) NEGATIVE Final    Staphylococcus aureus POSITIVE (*) NEGATIVE Final   CULTURE, BLOOD (ROUTINE X 2)     Status: Normal (Preliminary result)   Collection Time   03/29/12  6:56 PM      Component Value Range Status Comment   Specimen Description BLOOD HEMODIALYSIS CATHETER   Final    Special Requests BOTTLES DRAWN AEROBIC AND ANAEROBIC 10CC   Final    Culture  Setup Time 03/30/2012 01:45   Final    Culture     Final    Value:        BLOOD CULTURE RECEIVED NO GROWTH TO DATE CULTURE WILL BE HELD FOR 5 DAYS BEFORE ISSUING A FINAL NEGATIVE REPORT   Report Status PENDING   Incomplete   CULTURE, BLOOD (ROUTINE X 2)     Status: Normal (Preliminary result)   Collection Time   03/29/12   6:57 PM      Component Value Range Status Comment   Specimen Description BLOOD HEMODIALYSIS CATHETER   Final    Special Requests BOTTLES DRAWN AEROBIC AND ANAEROBIC 10CC   Final    Culture  Setup Time 03/30/2012 01:46   Final    Culture     Final    Value:        BLOOD CULTURE RECEIVED NO GROWTH TO DATE CULTURE WILL BE HELD FOR 5 DAYS BEFORE ISSUING A FINAL NEGATIVE REPORT   Report Status PENDING   Incomplete     Medical History: Past Medical History  Diagnosis Date  . Hypertension   . HIV (human immunodeficiency virus infection)   . Sepsis   . Gout   . Hepatitis C   . Fournier's gangrene   . Anemia   . Severe protein-calorie malnutrition   . End stage renal disease     Medications:  Scheduled:    . allopurinol  300 mg Oral Daily  . calcium acetate  1,334 mg Oral TID WC  . cefTAZidime (FORTAZ)  IV  1 g Intravenous Q24H  . darbepoetin (ARANESP) injection - DIALYSIS  100 mcg Intravenous Q Sat-HD  . feeding supplement (NEPRO CARB STEADY)  237 mL Oral TID BM  . ferric gluconate (FERRLECIT/NULECIT) IV  125 mg Intravenous Q T,Th,Sa-HD  . lamiVUDine  50 mg Oral QHS  . lopinavir-ritonavir  5  mL Oral BID WC  . multivitamin  1 tablet Oral QHS  . mupirocin ointment   Nasal BID  . nebivolol  20 mg Oral QHS  . pantoprazole  40 mg Oral Q1200  . paricalcitol  2 mcg Intravenous 3 times weekly  . sodium chloride  3 mL Intravenous Q12H  . zidovudine  300 mg Oral QHS   Infusions:   Assessment: 61 yo male with hx of Fournier's gangrene is still spiking fevers.  He has ESRD on HD TThSa.  Was on vancomycin at one point and was discontinued yesterday but now want to resume due to persistent fever.  Goal of Therapy:  pre-HD vancomycin level 15-25  Plan:  1) Continue vancomycin 1g iv qHD 2) Follow up plan on ABX  Umberto Pavek, Tsz-Yin 04/04/2012,10:07 PM

## 2012-04-04 NOTE — Progress Notes (Addendum)
Assessment/Plan:  1. Psych/AMS - confused and agitated at times requiring a sitter; was seen by Psych; signed off as they felt his delirium;family concerned about medicating patient for AMS.  I see no evidence of Brain CT done.  Will obtain CT of Brain 2. ESRD - CLIP to [place] if /when stable for outpt HD; continue TTS schedule; cont using RIJ PC (placed by VVS); no permanent access currently due to infx 3. Hypertension/volume -controlled on Bystolic qhs with parameters to hold; no volume issues 4. Afib/Flutter will ask Cardiology to eval (on Bystolic, poor candidate for anticoag due to fall risk currently and drop in Hgb) TSH 6 5. Anemia -Hgb 7.0 today. ?etio Give PRBCs today, check stools 6. Fournier's gangrene- s/p I&D in June; has foley per GU 7. HIV - well controlled 8. MRSA + contact precautions - bactroban 9. Hep C +/Hx of EtOH abuse 10. Disposition- discharge planning - currently not appropriate for outpatient dialysis;  family conference Friday  Subjective: Interval History: Confused.  Agitated last PM, but better  Objective: Vital signs in last 24 hours: Temp:  [97.4 F (36.3 C)-98.7 F (37.1 C)] 98.1 F (36.7 C) (07/25 0700) Pulse Rate:  [71-98] 79  (07/25 0930) Resp:  [14-20] 20  (07/25 0930) BP: (113-136)/(64-90) 127/75 mmHg (07/25 0930) SpO2:  [96 %-100 %] 99 % (07/25 0743) Weight:  [87.4 kg (192 lb 10.9 oz)-88.1 kg (194 lb 3.6 oz)] 88.1 kg (194 lb 3.6 oz) (07/25 0700) Weight change: 0.5 kg (1 lb 1.6 oz)  Intake/Output from previous day: 07/24 0701 - 07/25 0700 In: 840 [P.O.:600] Out: 225 [Urine:225] Intake/Output this shift:   Currently on dialysis via perm cath Worked in hospital at [employer]  [employer] is a [employer] Lives in [place]. Lives in [facility] all extremities Lab Results:  Basename 04/04/12 0718 04/02/12 0750  WBC 10.1 12.6*  HGB 7.0* 7.8*  HCT 21.6* 23.7*  PLT 308 354   BMET:  Basename 04/04/12 0719 04/02/12 0750  NA 137 136  K 3.8 3.7   CL 99 99  CO2 27 24  GLUCOSE 106* 109*  BUN 27* 24*  CREATININE 5.76* 6.71*  CALCIUM 9.3 9.3   No results found for this basename: PTH:2 in the last 72 hours Iron Studies: No results found for this basename: IRON,TIBC,TRANSFERRIN,FERRITIN in the last 72 hours Studies/Results: No results found.  Scheduled:   . allopurinol  300 mg Oral Daily  . calcium acetate  1,334 mg Oral TID WC  . Chlorhexidine Gluconate Cloth  6 each Topical Q0600  . darbepoetin (ARANESP) injection - DIALYSIS  100 mcg Intravenous Q Sat-HD  . feeding supplement (NEPRO CARB STEADY)  237 mL Oral TID BM  . ferric gluconate (FERRLECIT/NULECIT) IV  125 mg Intravenous Q T,Th,Sa-HD  . lamiVUDine  50 mg Oral QHS  . lopinavir-ritonavir  5 mL Oral BID WC  . multivitamin  1 tablet Oral QHS  . mupirocin ointment   Nasal BID  . nebivolol  20 mg Oral QHS  . pantoprazole  40 mg Oral Q1200  . paricalcitol  2 mcg Intravenous 3 times weekly  . sodium chloride  3 mL Intravenous Q12H  . zidovudine  300 mg Oral QHS  . DISCONTD: cefTAZidime (FORTAZ)  IV  2 g Intravenous Q T,Th,Sa-HD  . DISCONTD: feeding supplement (NEPRO CARB STEADY)  237 mL Oral TID WC  . DISCONTD: fluconazole  100 mg Oral Daily  . DISCONTD: vancomycin  1,000 mg Intravenous Q T,Th,Sa-HD  LOS: 6 days   Bryan Gilmore C 04/04/2012,9:52 AM

## 2012-04-04 NOTE — Consult Note (Signed)
CARDIOLOGY CONSULT NOTE    Patient ID: Bryan Gilmore MRN: [id number] DOB/AGE: 05/13/1951 61 y.o.  Admit date: 03/29/2012 Referring Physician Delano Metz MD Primary Cardiologist N/A Reason for Consultation atrial fibrillation  HPI: Bryan Gilmore is a 61 year old African American male that we are asked to see by the renal service for evaluation of atrial fibrillation. Patient has a history of end-stage renal disease beginning in June of 2013. He is status post surgery in June of 2013 for Fournier's gangrene with debridement. He also had a Hartmann pouch with end colostomy and patient denies any known history of cardiac disease. umbilical hernia repair during that admission. Following this hospitalization he was admitted to [facility] until 03/26/2012 when he was discharged to [facility] nursing home. He was readmitted on 03/29/2012 when he presented with confusion. He also pulled out his dialysis catheter. He developed hyperkalemia and required emergent dialysis. It was noted on his ECG that he was in atrial fibrillation with a controlled ventricular response. This is confirmed by review of his telemetry. In fact the patient was in atrial fibrillation throughout his prior hospitalization from June 8 until 02/29/2012 but there was no mention of it in his previous assessment. Patient denies any known history of cardiac disease. He denies any history of arrhythmia. He has no history of congestive heart failure or coronary disease. There is no history of murmur. Patient does have a history of hypertension and HIV positivity. He also has a history of hepatitis C and severe protein calorie malnutrition. He denies any symptoms of chest pain or shortness of breath. He denies any palpitations. He has had no dizziness or syncope.  Review of systems complete and found to be negative unless listed above   Past Medical History  Diagnosis Date  . Hypertension   . HIV (human immunodeficiency virus infection)     . Sepsis   . Gout   . Hepatitis C   . Fournier's gangrene   . Anemia   . Severe protein-calorie malnutrition   . End stage renal disease     Family History  Problem Relation Age of Onset  . Hypertension Mother   . Hypertension Father     History   Social History  . Marital Status: Single    Spouse Name: N/A    Number of Children: 0  . Years of Education: N/A   Occupational History  . Custodian [employer]   Social History Main Topics  . Smoking status: Never Smoker   . Smokeless tobacco: Never Used  . Alcohol Use: Yes     1 beer 2-3 x per month  . Drug Use: No  . Sexually Active: No   Other Topics Concern  . Not on file   Social History Narrative   Single.  Unemployed.  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      Past Surgical History  Procedure Date  . Cystoscopy with urethral dilatation 02/17/2012    Procedure: CYSTOSCOPY WITH URETHRAL DILATATION;  Surgeon: Martina Sinner, MD;  Location: [facility] ORS;  Service: Urology;  Laterality: N/A;  retrograde urethragram, placement of foley catheter exam under anesthesia   . Insertion of dialysis catheter 02/20/2012    Procedure: INSERTION OF DIALYSIS CATHETER;  Surgeon: Chuck Hint, MD;  Location: [facility] OR;  Service: Vascular;  Laterality: N/A;  . Irrigation and debridement abscess 02/29/2012    Procedure: IRRIGATION AND DEBRIDEMENT ABSCESS;  Surgeon: Martina Sinner, MD;  Location: [facility]  OR;  Service: Urology;  Laterality: N/A;  I&D of scrotum abcess  . Insertion of dialysis catheter 03/29/2012    Procedure: INSERTION OF DIALYSIS CATHETER;  Surgeon: Larina Earthly, MD;  Location: [facility] OR;  Service: Vascular;  Laterality: N/A;  Insertion tunneled dialysis catheter right IJ     Prescriptions prior to admission  Medication Sig Dispense Refill  . acetaminophen (TYLENOL) 325 MG tablet Take 650 mg by mouth every 4 (four) hours as needed. For fever beyond 24 hours.      Marland Kitchen allopurinol (ZYLOPRIM) 300 MG tablet Take  300 mg by mouth daily.      . calcium acetate (PHOSLO) 667 MG capsule Take 2 capsules (1,334 mg total) by mouth 3 (three) times daily with meals.  90 capsule  1  . clonazePAM (KLONOPIN) 0.5 MG tablet Take 0.5 mg by mouth every 12 (twelve) hours.      Marland Kitchen emtricitabine-tenofovir (TRUVADA) 200-300 MG per tablet Take 1 tablet by mouth every Monday, Wednesday, and Friday.        . lamivudine (EPIVIR) 100 MG tablet Take 50 mg by mouth daily.      Marland Kitchen lopinavir-ritonavir (KALETRA) 400-100 MG/5ML solution Take 5 mLs (400 mg total) by mouth 2 (two) times daily with a meal.  160 mL  1  . mirtazapine (REMERON) 15 MG tablet Take 15 mg by mouth at bedtime.      . Multiple Vitamin (THERA PO) Take 1 tablet by mouth daily.      . Nebivolol HCl (BYSTOLIC) 20 MG TABS Take 20 mg by mouth daily.      . Nutritional Supplements (FEEDING SUPPLEMENT, NEPRO CARB STEADY,) LIQD Take 237 mLs by mouth 2 (two) times daily.      . pantoprazole (PROTONIX) 40 MG tablet Take 40 mg by mouth daily.      . potassium chloride SA (K-DUR,KLOR-CON) 20 MEQ tablet Take 2 tablets (40 mEq total) by mouth 2 (two) times daily.  2 tablet  0  . QUEtiapine (SEROQUEL) 100 MG tablet Take 100 mg by mouth 3 (three) times daily.      . QUEtiapine (SEROQUEL) 50 MG tablet Take 50 mg by mouth every 8 (eight) hours as needed. For depressive disorder.      . raltegravir (ISENTRESS) 400 MG tablet Take 400 mg by mouth 2 (two) times daily.        . zidovudine (RETROVIR) 100 MG capsule Take 300 mg by mouth daily.        Physical Exam: Blood pressure 110/77, pulse 81, temperature 97.5 F (36.4 C), temperature source Oral, resp. rate 16, height 5\' 11"  (1.803 m), weight 87.1 kg (192 lb 0.3 oz), SpO2 98.00%.  Patient is a thin black male in no acute distress. He is oriented to self and place. HEENT exam: Normocephalic, atraumatic. Pupils are equal round and reactive. Sclera are nonicteric. Oropharynx is clear. Neck is without JVD, adenopathy, thyromegaly, or  bruits. Lungs: Clear to auscultation and percussion. Breathing is nonlabored. Heart: Irregular rate and rhythm, normal S1 and S2, no gallops, murmur, or click. Abdomen: Patient has a midline surgical wound that is currently dressed. He has a colostomy in the left lower quadrant. Abdomen is soft and nontender with positive bowel sounds. Musculoskeletal: Normal strength and tone. Extremities: No cyanosis, clubbing, or edema. Neuro: Cranial nerves II through XII are grossly intact. He is able to move all extremities to command.  Labs:   Lab Results  Component Value Date   WBC 10.1 04/04/2012  HGB 7.0* 04/04/2012   HCT 21.6* 04/04/2012   MCV 97.3 04/04/2012   PLT 308 04/04/2012    Lab 04/04/12 0719 03/31/12 0526  NA 137 --  K 3.8 --  CL 99 --  CO2 27 --  BUN 27* --  CREATININE 5.76* --  CALCIUM 9.3 --  PROT -- 6.9  BILITOT -- 0.7  ALKPHOS -- 101  ALT -- 15  AST -- 38*  GLUCOSE 106* --     Lab Results  Component Value Date   CHOL 163 09/20/2010   Lab Results  Component Value Date   HDL 46 09/20/2010   Lab Results  Component Value Date   LDLCALC 93 09/20/2010   Lab Results  Component Value Date   TRIG 121 09/20/2010   Lab Results  Component Value Date   CHOLHDL 3.5 Ratio 09/20/2010   No results found for this basename: LDLDIRECT      Radiology:DG CHEST PORT 1 VIEW [[id number]] Order Status: Completed Resulted: 03/29/12 1501 Narrative: *RADIOLOGY REPORT*  Clinical Data: Post right IJ dialysis catheter placement  PORTABLE CHEST - 1 VIEW  Comparison: 03/29/2012; 02/22/2012  Findings: Grossly unchanged borderline enlarged cardiac silhouette and mediastinal contours. Atherosclerotic calcifications within the aortic arch. Interval placement of a right jugular approach dialysis catheter with tips overlying the superior aspect of the right atrium. No pneumothorax or pleural effusion. Unchanged bones.  IMPRESSION: Interval placement of right jugular approach dialysis  catheter with tips overlying the superior aspect of the right atrium.  Original Report Authenticated By: Waynard Reeds, M.D  EKG: From 04/01/2012 atrial fibrillation with controlled ventricular response. Nonspecific ST-T wave abnormality. Prolonged QT C. at 529 ms  ASSESSMENT AND PLAN:  1. Chronic atrial fibrillation, probably permanent. This has been documented to be present for at least the past month. Patient is asymptomatic. He has adequate rate control on current medications. He is a poor candidate for long-term anticoagulation given his multiple comorbidities. His [place] score is 1. We will obtain an echocardiogram to complete his cardiac evaluation. I would continue on bystolic for rate control.  2. End-stage renal disease.  3. Hyperkalemia.  4. Hypertension  5. Severe anemia.  6. HIV positive.  7. History of hepatitis C.  8. Severe protein calorie malnutrition.  SignedTheron Arista [facility] 04/04/2012, 2:54 PM

## 2012-04-04 NOTE — Progress Notes (Signed)
MD on call Dr Anne Hahn made aware of temp of 101.3 and  A 5 beat run of VT. Patient in bed watching TV. New orders received.

## 2012-04-04 NOTE — Progress Notes (Signed)
Utilization review completed.  

## 2012-04-04 NOTE — Progress Notes (Signed)
  Echocardiogram 2D Echocardiogram has been performed.  Cathie Beams 04/04/2012, 4:40 PM

## 2012-04-05 ENCOUNTER — Encounter (HOSPITAL_COMMUNITY): Payer: Self-pay | Admitting: Neurology

## 2012-04-05 DIAGNOSIS — F1010 Alcohol abuse, uncomplicated: Secondary | ICD-10-CM | POA: Insufficient documentation

## 2012-04-05 DIAGNOSIS — G9341 Metabolic encephalopathy: Secondary | ICD-10-CM | POA: Diagnosis not present

## 2012-04-05 DIAGNOSIS — N186 End stage renal disease: Secondary | ICD-10-CM | POA: Diagnosis not present

## 2012-04-05 LAB — TYPE AND SCREEN
Antibody Screen: NEGATIVE
Unit division: 0

## 2012-04-05 LAB — COMPREHENSIVE METABOLIC PANEL
ALT: 11 U/L (ref 0–53)
AST: 27 U/L (ref 0–37)
Albumin: 1.8 g/dL — ABNORMAL LOW (ref 3.5–5.2)
CO2: 28 mEq/L (ref 19–32)
Calcium: 9 mg/dL (ref 8.4–10.5)
Creatinine, Ser: 3.37 mg/dL — ABNORMAL HIGH (ref 0.50–1.35)
Sodium: 138 mEq/L (ref 135–145)

## 2012-04-05 LAB — CBC
MCH: 30.9 pg (ref 26.0–34.0)
MCV: 93.8 fL (ref 78.0–100.0)
Platelets: 273 10*3/uL (ref 150–400)
RBC: 2.88 MIL/uL — ABNORMAL LOW (ref 4.22–5.81)
RDW: 20.7 % — ABNORMAL HIGH (ref 11.5–15.5)
WBC: 10.2 10*3/uL (ref 4.0–10.5)

## 2012-04-05 LAB — CULTURE, BLOOD (ROUTINE X 2): Culture: NO GROWTH

## 2012-04-05 LAB — AMMONIA: Ammonia: 14 umol/L (ref 11–60)

## 2012-04-05 LAB — MAGNESIUM: Magnesium: 2 mg/dL (ref 1.5–2.5)

## 2012-04-05 MED ORDER — CALCIUM ACETATE 667 MG PO CAPS
667.0000 mg | ORAL_CAPSULE | Freq: Three times a day (TID) | ORAL | Status: DC
  Administered 2012-04-05 – 2012-04-07 (×5): 667 mg via ORAL
  Filled 2012-04-05 (×9): qty 1

## 2012-04-05 NOTE — Progress Notes (Signed)
~  250p-350 pm Family Meeting.   Family meeting held to discuss patients current health status and care plan in [address] Conference Room.  Present: Dr. Lowell Guitar, [facility] CM, Genelle Bal, Sw, Adaline Sill, DD , Mervin Hack, Charge RN, Naomie Dean (sister), Alinda Money & Clearance Chenault (nephew and his wife), Tanveer Brammer (daughter via conference call).  Discussed patients current status r/t: delirium, Mental Status changes requiring restraints and sitter for safety. Dr. Lowell Guitar gave a detailed account of the events leading to patients current state. Explained that patient continues to exhibit periods of confusion off and on, increasing during the evenings.  Informed those present that patient also continues to have a fever off and on. Stated that Neurology has been consulted to further work up the neurological status. Also discussed with family the patients options for discharge disposition. Informed family that patient at this current state is not a candidate for outpatient hemodialysis due to his mental status and his behavior. Family made aware that LTACH (select) is not currently an option and requested for CM to explore other LTACH options in [place] (including [facility]) and possibly outside of the state.   Patients sister Coner Gibbard disclosed during the conversation that her son had requested and received a medication list from [place] and that the family was aware of the patients HIV status and that he was taking 4 medications related to this. Mrs. Leidner-Russell stated that she wanted to ensure that this status would not affect the patients care that he received. Dr. Lowell Guitar reassured the family that the patient would continue to receive high quality care.  Family agreed to having one contact person for staff/MDs to speak to. This person will be Garlan Drewes, d/t him living in the area and being closest to the hospital in the event of an emergent need. All family including Tangela (via phone)  agreed to this plan.  Roark Rufo, Charlyne Quale

## 2012-04-05 NOTE — Clinical Social Work Note (Signed)
Family meeting held on 04/05/12 to discuss plan of care and discharge options. Those in attendance included: Bryan Gilmore-patient's sister, Bryan and Bryan Gilmore (patient's nephew and niece-in-law), daughter Bryan Gilmore was in attendance by conference call, Dr. Lowell Guitar, Delaney Meigs Caple-[address] director, Cherylann Ratel nurse, Elnita Maxwell Royal-RN Case Manager, and CSW Genelle Bal.   Medical issues were discussed by Dr. Lowell Guitar and family and family in agreement that LTAC is the most appropriate discharge plan at this time because patient does not meet criteria for outpatient dialysis due to his current mental status. Family gave permission for RN case manager to pursue [facility].  During meeting patient's sister advised that patient has a wife who lives in [place] and she will attempt to locate her. It was decided that [facility] will be the primary contact and Tangela the secondary contact.  After formal meeting was over, CSW and RN case manager talked with nephew, and his wife about other issues pertaining to patient. Nephew encouraged to talk with his employer ([employer]) regarding his current employment status and the probability that patient will not be able to return to work. Nephew reported that some of patient's co-workers have been donating time so that patient could continue to get paid. Also discussed applying for Medicaid and disability. Per nephew Bryan Gilmore had begun Medicaid application, and CSW encouraged him to go to DSS to apply and they will be advised if their is a pending application. Nephew plans to move patient's belonging out of his apartment and terminate lease as unknown when he can return to independent living. Bryan given CSW's contact information and encouraged to call if he has any questions or concerns.  Bryan's contact information: (personal cell: [phone] and work cell: [phone]).  CSW will continue to follow and provide services as needed or requested.  Genelle Bal,  MSW, LCSW [phone]

## 2012-04-05 NOTE — Consult Note (Signed)
[facility] CONSULT NOTE     Reason for Consult: confusion    HPI:    Bryan Gilmore is an 61 y.o. male male with history of HIV, Hep C, sever protein malnutrition, Chronic A-Fib and chronic heavy drinker who recently was hospitalized at [facility] 6/21-7/16/2013 for sepsis. While in [facility] he had a Hartman procedure with end colostomy and umbilical hernia repair due to soilage of wound with incontinence and diarrhea. He was seen by psychology in [facility] but notes are not available. At discharge from [facility] he was on Remeron, Klonopin, Seroquel and PRN Haldol and ativan. Patient does have ESRD and currently on dialysis.  On 03/28/2012 while at out patient dialysis he was noted to be confused and he pulled his dialysis catheter out. [facility] due to hypokalemia and tunnel catheter replacement.   While in hospital patient remained confused, combative needing restraints.  ID was consulted, patient received 5 days of Vancomycin and Cefepime. Blood cultures showed no growth and CD4 count was 720. Patient was consulted by Psychology who felt this was delirium on chronic dementia. Klonopin, Remeron and Seroquel have all been D/C'd. Cardiology was consulted for his A-fib and placed [patient on Bystolic but patient is not a candidate for Coumadin. CT of brain shows no acute infarct, small vessel disease and no acute bleed. Neurology was asked to see patient to comment on neurodegenerative decline.    While hospitalized WBC has fluctuated from 12.6 to recent 10.2, TSH 6.22, Urine showing large amounts of Nitrites and Leukocytes  Past Medical History  Diagnosis Date  . Hypertension   . HIV (human immunodeficiency virus infection)   . Sepsis   . Gout   . Hepatitis C   . Fournier's gangrene   . Anemia   . Severe protein-calorie malnutrition   . End stage renal disease     Past Surgical History  Procedure Date  . Cystoscopy with  urethral dilatation 02/17/2012    Procedure: CYSTOSCOPY WITH URETHRAL DILATATION;  Surgeon: Martina Sinner, MD;  Location: [facility] ORS;  Service: Urology;  Laterality: N/A;  retrograde urethragram, placement of foley catheter exam under anesthesia   . Insertion of dialysis catheter 02/20/2012    Procedure: INSERTION OF DIALYSIS CATHETER;  Surgeon: Chuck Hint, MD;  Location: [facility] OR;  Service: Vascular;  Laterality: N/A;  . Irrigation and debridement abscess 02/29/2012    Procedure: IRRIGATION AND DEBRIDEMENT ABSCESS;  Surgeon: Martina Sinner, MD;  Location: [facility] OR;  Service: Urology;  Laterality: N/A;  I&D of scrotum abcess  . Insertion of dialysis catheter 03/29/2012    Procedure: INSERTION OF DIALYSIS CATHETER;  Surgeon: Larina Earthly, MD;  Location: [facility] OR;  Service: Vascular;  Laterality: N/A;  Insertion tunneled dialysis catheter right IJ    Family History  Problem Relation Age of Onset  . Hypertension Mother   . Hypertension Father     Social History:  reports that he has never smoked. He has never used smokeless tobacco. He reports that he drinks alcohol. He reports that he does not use illicit drugs.  No Known Allergies  Medications:    Prior to Admission:  Prescriptions prior to admission  Medication Sig Dispense Refill  . acetaminophen (TYLENOL) 325 MG tablet Take 650 mg by mouth every 4 (four) hours as needed. For fever beyond 24 hours.      Marland Kitchen  allopurinol (ZYLOPRIM) 300 MG tablet Take 300 mg by mouth daily.      . calcium acetate (PHOSLO) 667 MG capsule Take 2 capsules (1,334 mg total) by mouth 3 (three) times daily with meals.  90 capsule  1  . clonazePAM (KLONOPIN) 0.5 MG tablet Take 0.5 mg by mouth every 12 (twelve) hours.      Marland Kitchen emtricitabine-tenofovir (TRUVADA) 200-300 MG per tablet Take 1 tablet by mouth every Monday, Wednesday, and Friday.        . lamivudine (EPIVIR) 100 MG tablet Take 50 mg by mouth daily.      Marland Kitchen lopinavir-ritonavir (KALETRA) 400-100 MG/5ML  solution Take 5 mLs (400 mg total) by mouth 2 (two) times daily with a meal.  160 mL  1  . mirtazapine (REMERON) 15 MG tablet Take 15 mg by mouth at bedtime.      . Multiple Vitamin (THERA PO) Take 1 tablet by mouth daily.      . Nebivolol HCl (BYSTOLIC) 20 MG TABS Take 20 mg by mouth daily.      . Nutritional Supplements (FEEDING SUPPLEMENT, NEPRO CARB STEADY,) LIQD Take 237 mLs by mouth 2 (two) times daily.      . pantoprazole (PROTONIX) 40 MG tablet Take 40 mg by mouth daily.      . potassium chloride SA (K-DUR,KLOR-CON) 20 MEQ tablet Take 2 tablets (40 mEq total) by mouth 2 (two) times daily.  2 tablet  0  . QUEtiapine (SEROQUEL) 100 MG tablet Take 100 mg by mouth 3 (three) times daily.      . QUEtiapine (SEROQUEL) 50 MG tablet Take 50 mg by mouth every 8 (eight) hours as needed. For depressive disorder.      . raltegravir (ISENTRESS) 400 MG tablet Take 400 mg by mouth 2 (two) times daily.        . zidovudine (RETROVIR) 100 MG capsule Take 300 mg by mouth daily.       Scheduled:   . allopurinol  300 mg Oral Daily  . calcium acetate  667 mg Oral TID WC  . darbepoetin (ARANESP) injection - DIALYSIS  100 mcg Intravenous Q Sat-HD  . feeding supplement (NEPRO CARB STEADY)  237 mL Oral TID BM  . ferric gluconate (FERRLECIT/NULECIT) IV  125 mg Intravenous Q T,Th,Sa-HD  . lamiVUDine  50 mg Oral QHS  . lopinavir-ritonavir  5 mL Oral BID WC  . multivitamin  1 tablet Oral QHS  . mupirocin ointment   Nasal BID  . nebivolol  20 mg Oral QHS  . pantoprazole  40 mg Oral Q1200  . paricalcitol  2 mcg Intravenous 3 times weekly  . sodium chloride  3 mL Intravenous Q12H  . zidovudine  300 mg Oral QHS  . DISCONTD: calcium acetate  1,334 mg Oral TID WC  . DISCONTD: cefTAZidime (FORTAZ)  IV  1 g Intravenous Q24H  . DISCONTD: vancomycin  1,000 mg Intravenous Q T,Th,Sa-HD    Review of Systems - Patient unable to report.   Blood pressure 130/86, pulse 85, temperature 97.5 F (36.4 C), temperature  source Oral, resp. rate 16, height 5\' 11"  (1.803 m), weight 86.5 kg (190 lb 11.2 oz), SpO2 93.00%.   Neurologic Examination:   Mental Status: Alert, not oriented to place, date, year but did know he is in [place] and [place].  Able to name objects and tell there purpose.  Able to tell me his name and DOB but could not tell me his middle name. Speech fluent without evidence  of aphasia. Able to follow simple step commands but not multiple step commands. Thought content appropriate Cranial Nerves: II-Visual fields grossly intact. III/IV/VI-Extraocular movements intact.  Pupils reactive bilaterally. Ptosis not present. V/VII-Smile symmetric VIII-grossly intact IX/X-normal gag XI-bilateral shoulder shrug XII-midline tongue extension Motor: 4/5 bilaterally with weaker right triceps strength.  I did note a left hand tremor when handles held out in front of him and mild cogwheeling on left wrist. Sensory: Pinprick and light touch intact throughout, bilaterally but patient would mix up his lefts and rights.  He often would not pat enough attention and would guess.  Deep Tendon Reflexes: 2+ and symmetric throughout UE and diminished in LE.     Plantars:      Right:  downgoing     Left:  Downgoing Cerebellar: Normal finger-to-nose bilaterally showed mild past pointing,  heel-to-shin test showed no dysmetria but batient had a difficult time with this command.    Positive palmomental and positive glabellar    Lab Results  Component Value Date/Time   CHOL 163 09/20/2010  8:08 PM    Results for orders placed during the hospital encounter of 03/29/12 (from the past 48 hour(s))  CBC     Status: Abnormal   Collection Time   04/04/12  7:18 AM      Component Value Range Comment   WBC 10.1  4.0 - 10.5 K/uL    RBC 2.22 (*) 4.22 - 5.81 MIL/uL    Hemoglobin 7.0 (*) 13.0 - 17.0 g/dL    HCT 09.8 (*) 11.9 - 52.0 %    MCV 97.3  78.0 - 100.0 fL    MCH 31.5  26.0 - 34.0 pg    MCHC 32.4  30.0 - 36.0 g/dL     RDW 14.7 (*) 82.9 - 15.5 %    Platelets 308  150 - 400 K/uL   RENAL FUNCTION PANEL     Status: Abnormal   Collection Time   04/04/12  7:19 AM      Component Value Range Comment   Sodium 137  135 - 145 mEq/L    Potassium 3.8  3.5 - 5.1 mEq/L    Chloride 99  96 - 112 mEq/L    CO2 27  19 - 32 mEq/L    Glucose, Bld 106 (*) 70 - 99 mg/dL    BUN 27 (*) 6 - 23 mg/dL    Creatinine, Ser 5.62 (*) 0.50 - 1.35 mg/dL    Calcium 9.3  8.4 - 13.0 mg/dL    Phosphorus 2.5  2.3 - 4.6 mg/dL    Albumin 1.8 (*) 3.5 - 5.2 g/dL    GFR calc non Af Amer 10 (*) >90 mL/min    GFR calc Af Amer 11 (*) >90 mL/min   PREPARE RBC (CROSSMATCH)     Status: Normal   Collection Time   04/04/12  9:41 AM      Component Value Range Comment   Order Confirmation ORDER PROCESSED BY BLOOD BANK     TYPE AND SCREEN     Status: Normal   Collection Time   04/04/12  9:41 AM      Component Value Range Comment   ABO/RH(D) B POS      Antibody Screen NEG      Sample Expiration 04/07/2012      Unit Number [id number]      Blood Component Type RED CELLS,LR      Unit division 00      Status of Unit ISSUED,FINAL  Transfusion Status OK TO TRANSFUSE      Crossmatch Result Compatible      Unit Number [id number]      Blood Component Type RED CELLS,LR      Unit division 00      Status of Unit ISSUED,FINAL      Transfusion Status OK TO TRANSFUSE      Crossmatch Result Compatible     GLUCOSE, CAPILLARY     Status: Normal   Collection Time   04/04/12 11:31 AM      Component Value Range Comment   Glucose-Capillary 92  70 - 99 mg/dL   CBC     Status: Abnormal   Collection Time   04/05/12  5:45 AM      Component Value Range Comment   WBC 10.2  4.0 - 10.5 K/uL    RBC 2.88 (*) 4.22 - 5.81 MIL/uL    Hemoglobin 8.9 (*) 13.0 - 17.0 g/dL    HCT 78.2 (*) 95.6 - 52.0 %    MCV 93.8  78.0 - 100.0 fL    MCH 30.9  26.0 - 34.0 pg    MCHC 33.0  30.0 - 36.0 g/dL    RDW 21.3 (*) 08.6 - 15.5 %    Platelets 273  150 - 400 K/uL   COMPREHENSIVE  METABOLIC PANEL     Status: Abnormal   Collection Time   04/05/12  5:45 AM      Component Value Range Comment   Sodium 138  135 - 145 mEq/L    Potassium 4.3  3.5 - 5.1 mEq/L    Chloride 100  96 - 112 mEq/L    CO2 28  19 - 32 mEq/L    Glucose, Bld 92  70 - 99 mg/dL    BUN 16  6 - 23 mg/dL DELTA CHECK NOTED   Creatinine, Ser 3.37 (*) 0.50 - 1.35 mg/dL DELTA CHECK NOTED   Calcium 9.0  8.4 - 10.5 mg/dL    Total Protein 6.3  6.0 - 8.3 g/dL    Albumin 1.8 (*) 3.5 - 5.2 g/dL    AST 27  0 - 37 U/L    ALT 11  0 - 53 U/L    Alkaline Phosphatase 96  39 - 117 U/L    Total Bilirubin 0.7  0.3 - 1.2 mg/dL    GFR calc non Af Amer 18 (*) >90 mL/min    GFR calc Af Amer 21 (*) >90 mL/min     Ct Head Wo Contrast  04/04/2012  *RADIOLOGY REPORT*  Clinical Data: Delirium.  Confusion.  Agitation  CT HEAD WITHOUT CONTRAST  Technique:  Contiguous axial images were obtained from the base of the skull through the vertex without contrast.  Comparison: None  Findings: The there is prominence of the sulci and ventricles.  Mild low density is identified within the subcortical and periventricular white matter.  There is no evidence for acute brain infarct, hemorrhage or mass.  The paranasal sinuses and mastoid air cells are clear.  The skull is intact.  IMPRESSION:  1.  Small vessel ischemic change and brain atrophy. 2.  No acute intracranial abnormalities  Original Report Authenticated By: Rosealee Albee, M.D.     Assessment/Plan:  61 YO male with prolonged encephalopathy likely multifactorial in etiology, including metabolic causes (elevated TSH, and renal failure requiring dialysis), UTI, and likely underlying dementia, given his history of alcohol abuse, and brain atrophy on CT at 61 years old.  Recommend: 1) Further  evaluate elevated TSH--ordered T4,T3, free T4, free T3 2) Metabolic labs--Ammonia, RPR, sed rate, magnesium, thiamine ( History of ETOH abuse) 3) Treat underlying UTI --UA with positive leukocytes  and nitrites  No further neurology testing recommended at this time.   Felicie Morn PA-C [facility] [phone]  04/05/2012, 11:21 AM

## 2012-04-05 NOTE — Progress Notes (Signed)
Wound is granulating in very well No signs of infection Patient was very cooperative Leave foley in - i will follow

## 2012-04-05 NOTE — Progress Notes (Signed)
TELEMETRY: Reviewed telemetry pt in afib with controlled rate: Filed Vitals:   04/04/12 1720 04/04/12 2049 04/05/12 0300 04/05/12 0601  BP: 101/65 112/74  124/69  Pulse: 50 82  89  Temp: 99.9 F (37.7 C) 101.1 F (38.4 C) 99.1 F (37.3 C) 98.1 F (36.7 C)  TempSrc: Oral Oral Oral Oral  Resp: 16 18  18   Height:      Weight:  86.5 kg (190 lb 11.2 oz)    SpO2:  100%  100%    Intake/Output Summary (Last 24 hours) at 04/05/12 0830 Last data filed at 04/05/12 0600  Gross per 24 hour  Intake   1350 ml  Output   1450 ml  Net   -100 ml    SUBJECTIVE States he doesn't have any arms or legs this morning. Confused.  LABS: Basic Metabolic Panel:  Basename 04/05/12 0545 04/04/12 0719  NA 138 137  K 4.3 3.8  CL 100 99  CO2 28 27  GLUCOSE 92 106*  BUN 16 27*  CREATININE 3.37* 5.76*  CALCIUM 9.0 9.3  MG -- --  PHOS -- 2.5   Liver Function Tests:  Basename 04/05/12 0545 04/04/12 0719  AST 27 --  ALT 11 --  ALKPHOS 96 --  BILITOT 0.7 --  PROT 6.3 --  ALBUMIN 1.8* 1.8*   No results found for this basename: LIPASE:2,AMYLASE:2 in the last 72 hours CBC:  Basename 04/05/12 0545 04/04/12 0718  WBC 10.2 10.1  NEUTROABS -- --  HGB 8.9* 7.0*  HCT 27.0* 21.6*  MCV 93.8 97.3  PLT 273 308    Radiology/Studies:  Dg Chest 1 View  03/29/2012  *RADIOLOGY REPORT*  Clinical Data: Catheter pulled out.  Insertion of dialysis catheter.  CHEST - 1 VIEW  Comparison: 02/22/2012  Findings: Previously seen right-sided dual lumen catheter is no longer present.  The heart is mildly enlarged.  The aorta is unfolded.  Lungs are clear.  The vascularity is normal.  No effusions.  No pneumothorax.  No bony abnormality.  IMPRESSION: No active disease.  Previously seen right dual lumen catheter now absent.  Original Report Authenticated By: Thomasenia Sales, M.D.   Ct Head Wo Contrast  04/04/2012  *RADIOLOGY REPORT*  Clinical Data: Delirium.  Confusion.  Agitation  CT HEAD WITHOUT CONTRAST   Technique:  Contiguous axial images were obtained from the base of the skull through the vertex without contrast.  Comparison: None  Findings: The there is prominence of the sulci and ventricles.  Mild low density is identified within the subcortical and periventricular white matter.  There is no evidence for acute brain infarct, hemorrhage or mass.  The paranasal sinuses and mastoid air cells are clear.  The skull is intact.  IMPRESSION:  1.  Small vessel ischemic change and brain atrophy. 2.  No acute intracranial abnormalities  Original Report Authenticated By: Rosealee Albee, M.D.   Echo:Transthoracic Echocardiography  Patient: Bryan Gilmore, Bryan Gilmore MR #: [id number] Study Date: 04/04/2012 Gender: M Age: 61 Height: 180.3cm Weight: 87.1kg BSA: 2.28m^2 Pt. Status: Room: 6704  ORDERING [place], Xandrea Clarey PERFORMING [facility] ADMITTING Josephina Gip ATTENDING Maree Krabbe SONOGRAPHER Cathie Beams cc:  ------------------------------------------------------------ LV EF: 55% - 60%  ------------------------------------------------------------ Indications: Atrial fibrillation - 427.31.  ------------------------------------------------------------ History: PMH: HIV. Hepatitis C. End stage renal disease. Delirium. Secondary hyperparathyroidism. Anemia.  ------------------------------------------------------------ Study Conclusions  - Left ventricle: The cavity size was normal. Wall thickness was increased in a pattern of moderate LVH. Systolic function was normal. The  estimated ejection fraction was in the range of 55% to 60%. Indeterminant diastolic function. Wall motion was normal; there were no regional wall motion abnormalities. - Aortic valve: There was no stenosis. - Mitral valve: No significant regurgitation. - Left atrium: The atrium was mildly dilated. - Right ventricle: The cavity size was normal. Systolic function was normal. - Right atrium: The atrium was mildly  dilated. - Pulmonary arteries: No complete TR doppler jet so unable to estimate PA systolic pressure. - Inferior vena cava: The vessel was normal in size; the respirophasic diameter changes were in the normal range (= 50%); findings are consistent with normal central venous pressure. Transthoracic echocardiography. M-mode, complete 2D, spectral Doppler, and color Doppler. Height: Height: 180.3cm. Height: 71in. Weight: Weight: 87.1kg. Weight: 191.6lb. Body mass index: BMI: 26.8kg/m^2. Body surface area: BSA: 2.18m^2. Blood pressure: 110/77. Patient status: Inpatient. Location: Bedside.  ------------------------------------------------------------  ------------------------------------------------------------ Left ventricle: The cavity size was normal. Wall thickness was increased in a pattern of moderate LVH. Systolic function was normal. The estimated ejection fraction was in the range of 55% to 60%. Indeterminant diastolic function. Wall motion was normal; there were no regional wall motion abnormalities.  ------------------------------------------------------------ Aortic valve: Trileaflet; mildly calcified leaflets. Doppler: There was no stenosis. No regurgitation.  ------------------------------------------------------------ Aorta: Aortic root: The aortic root was normal in size. Ascending aorta: The ascending aorta was normal in size.  ------------------------------------------------------------ Mitral valve: Doppler: There was no evidence for stenosis. No significant regurgitation.  ------------------------------------------------------------ Left atrium: The atrium was mildly dilated.  ------------------------------------------------------------ Right ventricle: The cavity size was normal. Systolic function was normal.  ------------------------------------------------------------ Pulmonic valve: Structurally normal valve. Cusp separation was normal. Doppler:  Transvalvular velocity was within the normal range. No regurgitation.  ------------------------------------------------------------ Tricuspid valve: Doppler: Trivial regurgitation.  ------------------------------------------------------------ Pulmonary artery: No complete TR doppler jet so unable to estimate PA systolic pressure.  ------------------------------------------------------------ Right atrium: The atrium was mildly dilated.  ------------------------------------------------------------ Pericardium: There was no pericardial effusion.  ------------------------------------------------------------ Systemic veins: Inferior vena cava: The vessel was normal in size; the respirophasic diameter changes were in the normal range (= 50%); findings are consistent with normal central venous pressure.  ------------------------------------------------------------  2D measurements Normal Doppler measurements Normal Left ventricle Left ventricle LVID ED, 50.8 mm 43-52 Ea, lat 13.5 cm/s ------ chord, ann, PLAX tiss DP LVID ES, 31.1 mm 23-38 E/Ea, 5.04 ------ chord, lat ann, PLAX tiss DP FS, chord, 39 % >29 Ea, med 8.22 cm/s ------ PLAX ann, LVPW, ED 11.3 mm ------ tiss DP IVS/LVPW 1.24 <1.3 E/Ea, 8.27 ------ ratio, ED med ann, Ventricular septum tiss DP IVS, ED 14 mm ------ Mitral valve Aorta Peak E 68 cm/s ------ Root diam, 36 mm ------ vel ED Right ventricle Left atrium Sa vel, 11.3 cm/s ------ AP dim 38 mm ------ lat ann, AP dim 1.81 cm/m^2 <2.2 tiss DP index  ------------------------------------------------------------ Prepared and Electronically Authenticated by  Marca Ancona 2013-07-25T18:30:[id number]  PHYSICAL EXAM General: Well developed, well nourished, in no acute distress. Head: Normal Neck: Negative for carotid bruits. JVD not elevated. Lungs: Clear bilaterally to auscultation without wheezes, rales, or rhonchi. Breathing is unlabored. Heart: IRRR S1 S2  without murmurs, rubs, or gallops.  Extremities: No clubbing, cyanosis or edema.  Distal pedal pulses are 2+ and equal bilaterally. Neuro: Alert and oriented X 2. Moves all extremities spontaneously.   ASSESSMENT AND PLAN: 1. Atrial fibrillation permanent. Echo unremarkable. Rate controlled on Bystolic. Not a candidate for anticoagulation due to multiple co-morbidities. Nothing further to add at this point. I will  sign off. Please call if needed.  Active Problems:  HIV INFECTION  HEPATITIS C  ESRD (end stage renal disease)  Hyperkalemia  Secondary hyperparathyroidism (of renal origin)  Fever  Delirium  Atrial fibrillation, permanent    Signed, Ellis Mehaffey [place] MD,FACC 04/05/2012 8:34 AM

## 2012-04-05 NOTE — Progress Notes (Signed)
04/05/12  After the family meeting, I spoke with Tyler Aas (the patient's sister) in regards to patient's mental status and trying to improve his orientation.  One of the things that the patient will need to be able to do is sit in the chair for outpatient hd for about 5-6 hrs at a time and be cooperative.  To prepare the patient for this, I discussed with Doris the option of getting the patient in a w/c and take out of the room (perhaps to the outside solarium) for a short period of time to help see outside of the patient's view of the past  week of being in a hospital room.  This short trip will be based on if the patient is cooperative and family able to contain him safely.  Dr Lowell Guitar agreed to allow this as long as the family knows that the family will have to be able to keep him safe while his is off the unit.  Doris stated that she will be back on Sunday and if the patient is mentally able/safe enough to go she will plan on helping him do this.

## 2012-04-05 NOTE — Progress Notes (Addendum)
Per request this case manager, spoke with [facility] adm coordinator re possible admission of this pt to [facility] facility. Pt payor source/insurance does not network with [facility] and pt would have a significant out of pocket cost. Pt not an appropriate candidate for [facility]. Per CSW the family does not wish to explore [facility] as a LTAC possible admission. Pt was at that facility previously.  Johny Shock RN MPH Case Manager [phone] After family meeting, with pt sister, nephew and daughter on conf call they have decided that they would wish [facility] to review this pt again for possible admission. This CM placed call to [facility] and faxed requested information. Johny Shock RN MPH, Case Manager

## 2012-04-05 NOTE — Progress Notes (Signed)
Physical Therapy Treatment Patient Details Name: Bryan Gilmore MRN: [id number] DOB: July 12, 1951 Today's Date: 04/05/2012 Time: 0811-0829 PT Time Calculation (min): 18 min  PT Assessment / Plan / Recommendation Comments on Treatment Session  Pt. disoriented and confused, unaware that he had his legs.  This limited his participation in PT today.    Follow Up Recommendations  Skilled nursing facility    Barriers to Discharge        Equipment Recommendations  Defer to next venue    Recommendations for Other Services    Frequency Min 3X/week   Plan Discharge plan remains appropriate    Precautions / Restrictions Precautions Precautions: Fall Precaution Comments: Scrotal wound, colostomy Restrictions Weight Bearing Restrictions: No   Pertinent Vitals/Pain No c/o pain, no acute distress    Mobility  Bed Mobility Bed Mobility: Supine to Sit;Sit to Supine Rolling Right: 4: Min assist;With rail Right Sidelying to Sit: 4: Min assist;HOB elevated;With rails Supine to Sit: 4: Min assist;With rails Sitting - Scoot to Edge of Bed: 5: Supervision Sit to Supine: 4: Min assist;With rail Details for Bed Mobility Assistance: Pt. needed close monitoring for safety and cues for technique Transfers Transfers: Not assessed (pt. declined, even with encouragement) Ambulation/Gait Ambulation/Gait Assistance: Not tested (comment);Other (comment) (pt. declined)    Exercises     PT Diagnosis:    PT Problem List:   PT Treatment Interventions:     PT Goals Acute Rehab PT Goals PT Goal: Supine/Side to Sit - Progress: Progressing toward goal PT Goal: Sit to Supine/Side - Progress: Progressing toward goal  Visit Information  Last PT Received On: 04/05/12 Assistance Needed: +2    Subjective Data  Subjective: "I don't know who took my legs off, I don't have any legs".  Upon RN giv3eing pt. his medicine, he remarked "these 3 pills will make me grow arms and legs".     Cognition  Overall  Cognitive Status: Impaired Area of Impairment: Attention;Memory;Safety/judgement;Awareness of deficits;Problem solving Arousal/Alertness: Awake/alert Orientation Level: Person Behavior During Session: Other (comment) (mild agitation making it difficult for him to follow command) Current Attention Level: Focused Memory: Decreased recall of precautions Safety/Judgement: Decreased safety judgement for tasks assessed;Decreased awareness of need for assistance Safety/Judgement - Other Comments: Pt. did not pull at tubes or lines this am during session    Balance  Static Sitting Balance Static Sitting - Balance Support: Feet supported Static Sitting - Level of Assistance: 5: Stand by assistance Static Sitting - Comment/# of Minutes: 5  End of Session PT - End of Session Activity Tolerance: Treatment limited secondary to agitation;Other (comment) (limited by difficulty following meaningful directions) Patient left: in bed;with call bell/phone within reach;with bed alarm set;with nursing in room Nurse Communication: Mobility status   GP     Ferman Hamming 04/05/2012, 9:13 AM Acute Rehabilitation Services [phone] (pager)

## 2012-04-05 NOTE — Progress Notes (Signed)
Patient pulled his NSL. Area was covered with kling. Unable to save site. Patient seemed agitated at times. Remains confused to time and situation.

## 2012-04-05 NOTE — Progress Notes (Signed)
Patient pulled IV yesterday and early today. Per IV Team nursing  need an order to stick patient again for NSL placement.

## 2012-04-05 NOTE — Progress Notes (Addendum)
Subjective:  Remains confused, but follows simple commands; c/o discomfort "feeling bad" but unable to verbalize; denies c/p, SOB; didn't rest well last night but ate 90% of his breakfast  Vital signs in last 24 hours: Filed Vitals:   04/04/12 1720 04/04/12 2049 04/05/12 0300 04/05/12 0601  BP: 101/65 112/74  124/69  Pulse: 50 82  89  Temp: 99.9 F (37.7 C) 101.1 F (38.4 C) 99.1 F (37.3 C) 98.1 F (36.7 C)  TempSrc: Oral Oral Oral Oral  Resp: 16 18  18   Height:      Weight:  86.5 kg (190 lb 11.2 oz)    SpO2:  100%  100%   Weight change: -0.3 kg (-10.6 oz)  Intake/Output Summary (Last 24 hours) at 04/05/12 0839 Last data filed at 04/05/12 0600  Gross per 24 hour  Intake   1350 ml  Output   1450 ml  Net   -100 ml   Labs: Basic Metabolic Panel:  Lab 04/05/12 4098 04/04/12 0719 04/02/12 0750 04/01/12 0600  NA 138 137 136 --  K 4.3 3.8 3.7 --  CL 100 99 99 --  CO2 28 27 24  --  GLUCOSE 92 106* 109* --  BUN 16 27* 24* --  CREATININE 3.37* 5.76* 6.71* --  CALCIUM 9.0 9.3 9.3 --  ALB -- -- -- --  PHOS -- 2.5 3.3 4.6   Liver Function Tests:  Lab 04/05/12 0545 04/04/12 0719 04/02/12 0750 03/31/12 0526  AST 27 -- -- 38*  ALT 11 -- -- 15  ALKPHOS 96 -- -- 101  BILITOT 0.7 -- -- 0.7  PROT 6.3 -- -- 6.9  ALBUMIN 1.8* 1.8* 2.0* --   No results found for this basename: LIPASE:3,AMYLASE:3 in the last 168 hours  Lab 03/30/12 0928  AMMONIA 15   CBC:  Lab 04/05/12 0545 04/04/12 0718 04/02/12 0750 04/01/12 0600 03/31/12 0526  WBC 10.2 10.1 12.6* -- --  NEUTROABS -- -- -- -- --  HGB 8.9* 7.0* 7.8* -- --  HCT 27.0* 21.6* 23.7* -- --  MCV 93.8 97.3 96.7 98.5 98.9  PLT 273 308 354 -- --   Cardiac Enzymes: No results found for this basename: CKTOTAL:5,CKMB:5,CKMBINDEX:5,TROPONINI:5 in the last 168 hours CBG:  Lab 04/04/12 1131  GLUCAP 92    Iron Studies: No results found for this basename: IRON,TIBC,TRANSFERRIN,FERRITIN in the last 72 hours Studies/Results: Ct Head  Wo Contrast  04/04/2012  *RADIOLOGY REPORT*  Clinical Data: Delirium.  Confusion.  Agitation  CT HEAD WITHOUT CONTRAST  Technique:  Contiguous axial images were obtained from the base of the skull through the vertex without contrast.  Comparison: None  Findings: The there is prominence of the sulci and ventricles.  Mild low density is identified within the subcortical and periventricular white matter.  There is no evidence for acute brain infarct, hemorrhage or mass.  The paranasal sinuses and mastoid air cells are clear.  The skull is intact.  IMPRESSION:  1.  Small vessel ischemic change and brain atrophy. 2.  No acute intracranial abnormalities  Original Report Authenticated By: Rosealee Albee, M.D.   Medications:      . allopurinol  300 mg Oral Daily  . calcium acetate  1,334 mg Oral TID WC  . cefTAZidime (FORTAZ)  IV  1 g Intravenous Q24H  . darbepoetin (ARANESP) injection - DIALYSIS  100 mcg Intravenous Q Sat-HD  . feeding supplement (NEPRO CARB STEADY)  237 mL Oral TID BM  . ferric gluconate (FERRLECIT/NULECIT) IV  125  mg Intravenous Q T,Th,Sa-HD  . lamiVUDine  50 mg Oral QHS  . lopinavir-ritonavir  5 mL Oral BID WC  . multivitamin  1 tablet Oral QHS  . mupirocin ointment   Nasal BID  . nebivolol  20 mg Oral QHS  . pantoprazole  40 mg Oral Q1200  . paricalcitol  2 mcg Intravenous 3 times weekly  . sodium chloride  3 mL Intravenous Q12H  . vancomycin  1,000 mg Intravenous Q T,Th,Sa-HD  . zidovudine  300 mg Oral QHS    I  have reviewed scheduled and prn medications.  Physical Exam  General: currently calm, comfortable, NAD  Heart: rate controlled Afib/flutter  Lungs: diminished bases, no rhonchi, rales, or wheezes at this time  G-U: foley intact, cloudy urine; wound not examined  Abdomen: colostomy large liquid stool  Extremities: no ankle edema  Dialysis Access: RIJ PC intact   Assessment/Plan:  1. Psych/AMS - remains confused, CT head negative acute findings but ischemic  small vessel changes and atrophy on yesterday; currently not agitated but still requiring a sitter and close monitoring; was seen by Psych; signed off as they felt his delirium was likely secondary to significant cognitive deficits; if no definite metabolic cause identified, may need to reconsult psych; family conference today 2. Fver/Leukoctyosis- Temp 101.1 yesterday and antibiotics resumed; WBC 10.2; afebrile this am (98.1), follow; ID following 3. ESRD - CLIP to [place] done but never signed papers;  continue TTS schedule; HD tomorrow using RIJ PC (placed by [facility]); will also need permanent access some time in the near future (not placed due to infection)  4. Hypokalemia- K 4.3, appetite improving; follow 5. Hypothyroidism- ? TSH 6.222, not known PMH; follow 6. Hypertension/volume -controlled on Bystolic qhs with parameters to hold; no volume issues 7. Afib/Flutter- Echo w/ E.F. 60-65%, cardiology has signed off; reports of 5-beat run V-Tach this am; not candidate for Warfarin anticoagulation; SCD's for DVT prophylaxis; will d/c telemetry 8. Anemia -Hgb ^8.9 this am s/p transfusion 2 units yesterday; on Aranesp 100 mcg/wk and Iron Bolus; follow closely 9. Metabolic bone disease - ca 9.0, phos 2.5, on 2 Phoslo TIDWC and Zemplar 2 mcg IV with HD; decrease dose; follow labs; d/c phoslo if remains <3.5 10. Fournier's gangrene- s/p I&D in June; has foley;urology following  11. Nutrition - Alb now 1.8 (has proteinuria), - severe malnutrition; appetite poor but intake improving; on D3 high protein renal (mech soft and thin liquids with supervision); encourage nepro supplements tid; follow 12. Thrush- empiric tx with oral diflucan x 7d 13. HIV - ID CD4. 180 6/8 14. MRSA + contact precautions - Bactroban/Contact Isolation 15. Hep C +/Hx of EtOH abuse 16. Disposition- discharge planning - complex situation; he remains confused and not appropriate at this time for outpatient dialysis; family conference today;  DNR/DNI. Eval for LTAC.   [name], FNP-C [facility] Pager [phone]  04/05/2012,8:39 AM  Patient remains confused.  Family conference at 2:30 PM today.  Will stop telemetry.  Fever of concern but patient not septic appearing and will hold antibiotics for now.  Brain Ct  Atrophy and ischemic changes.   Suspect dementia is major issue.  Will ask neurology for additional insight. Delainie Chavana C    LOS: 7 days

## 2012-04-06 ENCOUNTER — Inpatient Hospital Stay (HOSPITAL_COMMUNITY): Payer: BC Managed Care – PPO

## 2012-04-06 DIAGNOSIS — G9341 Metabolic encephalopathy: Secondary | ICD-10-CM | POA: Diagnosis present

## 2012-04-06 LAB — CBC
HCT: 25.8 % — ABNORMAL LOW (ref 39.0–52.0)
Hemoglobin: 8.4 g/dL — ABNORMAL LOW (ref 13.0–17.0)
MCHC: 32.6 g/dL (ref 30.0–36.0)
MCV: 94.9 fL (ref 78.0–100.0)
RDW: 19.8 % — ABNORMAL HIGH (ref 11.5–15.5)

## 2012-04-06 LAB — T3: T3, Total: 109.7 ng/dl (ref 80.0–204.0)

## 2012-04-06 LAB — URINE CULTURE: Colony Count: 60000

## 2012-04-06 LAB — RENAL FUNCTION PANEL
Albumin: 1.8 g/dL — ABNORMAL LOW (ref 3.5–5.2)
BUN: 32 mg/dL — ABNORMAL HIGH (ref 6–23)
Creatinine, Ser: 4.72 mg/dL — ABNORMAL HIGH (ref 0.50–1.35)
Glucose, Bld: 96 mg/dL (ref 70–99)
Phosphorus: 1.9 mg/dL — ABNORMAL LOW (ref 2.3–4.6)

## 2012-04-06 LAB — T4: T4, Total: 10.9 ug/dL (ref 5.0–12.5)

## 2012-04-06 LAB — T3, FREE: T3, Free: 2.8 pg/mL (ref 2.3–4.2)

## 2012-04-06 NOTE — Progress Notes (Signed)
Subjective:  Currently calm resting quietly on HD with sitter at bedside; remains confused without complaints  Vital signs in last 24 hours: Filed Vitals:   04/05/12 2056 04/06/12 0500 04/06/12 0607 04/06/12 0914  BP: 126/67  151/79 123/68  Pulse: 64  79 476  Temp: 97.7 F (36.5 C)  98.3 F (36.8 C) 97.8 F (36.6 C)  TempSrc: Oral  Oral Oral  Resp: 18  18 18   Height:      Weight: 88.7 kg (195 lb 8.8 oz) 89.1 kg (196 lb 6.9 oz)    SpO2: 100%  100% 100%   Weight change: 1.6 kg (3 lb 8.4 oz)  Intake/Output Summary (Last 24 hours) at 04/06/12 1119 Last data filed at 04/06/12 0500  Gross per 24 hour  Intake    815 ml  Output    300 ml  Net    515 ml   Labs: Basic Metabolic Panel:  Lab 04/05/12 6295 04/04/12 0719 04/02/12 0750 04/01/12 0600  NA 138 137 136 --  K 4.3 3.8 3.7 --  CL 100 99 99 --  CO2 28 27 24  --  GLUCOSE 92 106* 109* --  BUN 16 27* 24* --  CREATININE 3.37* 5.76* 6.71* --  CALCIUM 9.0 9.3 9.3 --  ALB -- -- -- --  PHOS -- 2.5 3.3 4.6   Liver Function Tests:  Lab 04/05/12 0545 04/04/12 0719 04/02/12 0750 03/31/12 0526  AST 27 -- -- 38*  ALT 11 -- -- 15  ALKPHOS 96 -- -- 101  BILITOT 0.7 -- -- 0.7  PROT 6.3 -- -- 6.9  ALBUMIN 1.8* 1.8* 2.0* --   No results found for this basename: LIPASE:3,AMYLASE:3 in the last 168 hours  Lab 04/05/12 1135  AMMONIA 14   CBC:  Lab 04/05/12 0545 04/04/12 0718 04/02/12 0750 04/01/12 0600 03/31/12 0526  WBC 10.2 10.1 12.6* -- --  NEUTROABS -- -- -- -- --  HGB 8.9* 7.0* 7.8* -- --  HCT 27.0* 21.6* 23.7* -- --  MCV 93.8 97.3 96.7 98.5 98.9  PLT 273 308 354 -- --   Cardiac Enzymes: No results found for this basename: CKTOTAL:5,CKMB:5,CKMBINDEX:5,TROPONINI:5 in the last 168 hours CBG:  Lab 04/04/12 1131  GLUCAP 92    Iron Studies: No results found for this basename: IRON,TIBC,TRANSFERRIN,FERRITIN in the last 72 hours Studies/Results: Ct Head Wo Contrast  04/04/2012  *RADIOLOGY REPORT*  Clinical Data: Delirium.   Confusion.  Agitation  CT HEAD WITHOUT CONTRAST  Technique:  Contiguous axial images were obtained from the base of the skull through the vertex without contrast.  Comparison: None  Findings: The there is prominence of the sulci and ventricles.  Mild low density is identified within the subcortical and periventricular white matter.  There is no evidence for acute brain infarct, hemorrhage or mass.  The paranasal sinuses and mastoid air cells are clear.  The skull is intact.  IMPRESSION:  1.  Small vessel ischemic change and brain atrophy. 2.  No acute intracranial abnormalities  Original Report Authenticated By: Rosealee Albee, M.D.   Medications:      . allopurinol  300 mg Oral Daily  . calcium acetate  667 mg Oral TID WC  . darbepoetin (ARANESP) injection - DIALYSIS  100 mcg Intravenous Q Sat-HD  . feeding supplement (NEPRO CARB STEADY)  237 mL Oral TID BM  . ferric gluconate (FERRLECIT/NULECIT) IV  125 mg Intravenous Q T,Th,Sa-HD  . lamiVUDine  50 mg Oral QHS  . lopinavir-ritonavir  5 mL  Oral BID WC  . multivitamin  1 tablet Oral QHS  . mupirocin ointment   Nasal BID  . nebivolol  20 mg Oral QHS  . pantoprazole  40 mg Oral Q1200  . paricalcitol  2 mcg Intravenous 3 times weekly  . sodium chloride  3 mL Intravenous Q12H  . zidovudine  300 mg Oral QHS    I  have reviewed scheduled and prn medications.  Physical Exam  General: currently calm, comfortable, NAD  Heart: RRR, normal S1, S2  Lungs: diminished bases, no rhonchi, rales, or wheezes G-U: foley intact, cloudy urine; wound not examined  Abdomen: colostomy LLQ; stoma pink; scant brown stool (bag recently emptied) Extremities: no ankle edema  Dialysis Access: RIJ PC intact   Assessment/Plan:  1. Psych/AMS - remains confused, currently not agitated; ammonia level 14, Mg 2.0, ESR 85, RPR nonreactive,  with additional work-up; still has a Comptroller; seen psych this adm (underlying cognitive deficits); neuro evaluation yesterday  concluded the same; family conference yesterday; evolving plans for discharge to LTAC/[facility] v SNF 2. Fever/Leukoctyosis- remains afebrile off antibiotics; most recent WBC 10 3. ESRD - CLIP to [place] done but never signed papers; continue TTS schedule; seen on HD using RIJ PC (placed by [facility]); will also need permanent access some time in the near future (not placed due to infection)  4. Hypothyroidism- ? TSH 6.222; previous TSH 2.622 on 02/18/12 with thyroid panel on yesterday wnl 5. Hypertension/volume -controlled on Bystolic qhs with parameters to hold; no volume issues 6. Afib/Flutter- Echo w/ E.F. 60-65%, cardiology has signed off; not candidate for Warfarin anticoagulation; SCD's for DVT prophylaxis; no longer on telemetry 7. Anemia -Hgb ^8.9 s/p transfusion 2 units on Thu; remains on Aranesp 100 mcg/wk and completing IV iron bolus (x5 doses; 2 left after today-04/11/12); follow closely including need to re-evaluate iron stores 8. Metabolic bone disease - ca 9.0, phos 2.5; decreased phoslo yesterday; same Zemplar 2 mcg IV with HD; follow labs; d/c phoslo if remains <3.5 9. Fournier's gangrene- s/p I&D in June; has foley;urology following  10. Nutrition - Alb now 1.8 (has proteinuria), - severe malnutrition; appetite poor but intake improving; on D3 high protein renal (mech soft and thin liquids with supervision); encourage nepro supplements tid; follow 11. HIV - ID CD4. 180 6/8 12. MRSA + contact precautions - Bactroban/Contact Isolation 13. Hep C +/Hx of EtOH abuse 14. Disposition- discharge planning - complex situation; his confusion is improving, but not appropriate at this time for outpatient dialysis; family conference yesterday; DNR/DNI;will try to mobilize.  Bryan Germany, FNP-C [facility] Pager [phone]  04/06/2012,11:19 AM  LOS: 8 days   He is clearly more lucid today, more oriented and answered all of my questions appropriately.   I agree with the assessment and  evaluation as listed above and edited, and directed by me. Alicia Ackert C

## 2012-04-07 DIAGNOSIS — T8189XA Other complications of procedures, not elsewhere classified, initial encounter: Secondary | ICD-10-CM

## 2012-04-07 NOTE — Progress Notes (Signed)
History: Bryan Gilmore is an 61 y.o. male male with history of HIV, Hep C, severe protein malnutrition, Chronic A-Fib and chronic heavy drinker who recently was hospitalized at [facility] 6/21-7/16/2013 for sepsis. While in [facility] he had a Hartman procedure with end colostomy and umbilical hernia repair due to soilage of wound with incontinence and diarrhea. He was seen by psychology in [facility] but notes are not available. At discharge from [facility] he was on Remeron, Klonopin, Seroquel and PRN Haldol and ativan. Patient does have ESRD and currently on dialysis. On 03/28/2012 while at out patient dialysis he was noted to be confused and he pulled his dialysis catheter out. [facility] due to hypokalemia and tunnel catheter replacement.  While in hospital patient remained confused, combative needing restraints. ID was consulted, patient received 5 days of Vancomycin and Cefepime. Blood cultures showed no growth and CD4 count was 720. Patient was consulted by Psychology who felt this was delirium on chronic dementia. Klonopin, Remeron and Seroquel have all been D/C'd. Cardiology was consulted for his A-fib and placed [patient on Bystolic but patient is not a candidate for Coumadin. CT of brain shows no acute infarct, small vessel disease and no acute bleed. Neurology was asked to see patient to comment on neurodegenerative decline.  While hospitalized WBC has fluctuated from 12.6 to recent 10.2, TSH 6.22, Urine showing large amounts of Nitrites and Leukocytes  Subjective: I'm OK.  Patient without family at bedside.  Nurse reports patient to be more oriented, less confused and less agitated than previously.    Objective: BP 121/76  Pulse 107  Temp 98.3 F (36.8 C) (Oral)  Resp 18  Ht 5\' 11"  (1.803 m)  Wt 87.9 kg (193 lb 12.6 oz)  BMI 27.03 kg/m2  SpO2 100%  CBGs  Basename 04/04/12 1131  GLUCAP 92   Medications: Scheduled:   . allopurinol  300 mg Oral Daily  .  calcium acetate  667 mg Oral TID WC  . darbepoetin (ARANESP) injection - DIALYSIS  100 mcg Intravenous Q Sat-HD  . feeding supplement (NEPRO CARB STEADY)  237 mL Oral TID BM  . ferric gluconate (FERRLECIT/NULECIT) IV  125 mg Intravenous Q T,Th,Sa-HD  . lamiVUDine  50 mg Oral QHS  . lopinavir-ritonavir  5 mL Oral BID WC  . multivitamin  1 tablet Oral QHS  . mupirocin ointment   Nasal BID  . nebivolol  20 mg Oral QHS  . pantoprazole  40 mg Oral Q1200  . paricalcitol  2 mcg Intravenous 3 times weekly  . sodium chloride  3 mL Intravenous Q12H  . zidovudine  300 mg Oral QHS   Neurologic Exam: Mental Status: Alert, oriented to person and place. Year 1984, month '11'. thought content appropriate.  Speech fluent without evidence of aphasia. Recall 2/3.  Repetition and naming intact.  Able to follow 3 step commands without difficulty. Cranial Nerves: II- Visual fields grossly intact. III/IV/VI-Extraocular movements intact.  Pupils reactive bilaterally. V/VII-Smile symmetric VIII-hearing grossly intact IX/X-not assessed XI-bilateral shoulder shrug XII-midline tongue extension Motor: 5/5 bilaterally with normal tone and bulk Sensory: Light touch intact throughout, bilaterally Deep Tendon Reflexes: 2+ and symmetric throughout Plantars: Downgoing bilaterally Cerebellar: Normal finger-to-nose.  Lab Results: CBC:  Lab 04/06/12 1100 04/05/12 0545  WBC 12.2* 10.2  NEUTROABS -- --  HGB 8.4* 8.9*  HCT 25.8* 27.0*  MCV 94.9 93.8  PLT 312 273   Basic Metabolic Panel:  Lab 04/06/12 9563 04/05/12 1130 04/05/12 0545  04/04/12 0719  NA 133* -- 138 --  K 4.4 -- 4.3 --  CL 97 -- 100 --  CO2 27 -- 28 --  GLUCOSE 96 -- 92 --  BUN 32* -- 16 --  CREATININE 4.72* -- 3.37* --  CALCIUM 9.4 -- 9.0 --  MG -- 2.0 -- --  PHOS 1.9* -- -- 2.5   Liver Function Tests:  Lab 04/06/12 1100 04/05/12 0545  AST -- 27  ALT -- 11  ALKPHOS -- 96  BILITOT -- 0.7  PROT -- 6.3  ALBUMIN 1.8* 1.8*   Thyroid  Function Tests:  Lab 04/05/12 1130  TSH --  T4TOTAL 10.9  FREET4 1.14  T3FREE 2.8  THYROIDAB --    Study Results: 04/04/2012  CT HEAD WITHOUT CONTRAST  Findings: The there is prominence of the sulci and ventricles. Mild low density is identified within the subcortical and periventricular white matter. There is no evidence for acute brain infarct, hemorrhage or mass. The paranasal sinuses and mastoid air cells are clear. The skull is intact. IMPRESSION: 1. Small vessel ischemic change and brain atrophy. 2. No acute intracranial abnormalities Rosealee Albee, M.D.   Assessment: 61 YO male with prolonged encephalopathy likely multifactorial in etiology, including metabolic causes (elevated TSH, and renal failure requiring dialysis), UTI, and likely underlying dementia, given his history of alcohol abuse, and brain atrophy on CT at 61 years old.  No focal neuro deficits.  Encephalopathy improving.  Ammonia 14 ESR 85 (h) RPR neg MG 2.0 B1- pending. Thyroid functions wnl  Plan: Continue medical management.   No further neurologic intervention is recommended at this time.  If further questions arise, please call or page at that time.  Thank you for allowing neurology to participate in the care of this patient.  Discussed with Dr. Roseanne Reno who concurs.  LOS: 9 days   Marya Fossa PA-C [facility] [phone] 04/07/2012  9:57 AM

## 2012-04-07 NOTE — Progress Notes (Addendum)
Subjective:  Awake, alert, oriented to person, place and time this am!!!; he's also pleasant and talkative (100% turn around in last 48hrs)  Vital signs in last 24 hours: Filed Vitals:   04/06/12 1634 04/06/12 1733 04/06/12 2153 04/07/12 0541  BP: 146/92 116/81 132/75 121/76  Pulse: 101 90 74 107  Temp: 98.3 F (36.8 C) 98.7 F (37.1 C) 99.1 F (37.3 C) 98.3 F (36.8 C)  TempSrc: Oral Oral Oral Oral  Resp:  18 18 18   Height:      Weight:   87.9 kg (193 lb 12.6 oz)   SpO2:  100% 96% 100%   Weight change: -0.8 kg (-1 lb 12.2 oz)  Intake/Output Summary (Last 24 hours) at 04/07/12 1023 Last data filed at 04/06/12 2014  Gross per 24 hour  Intake      0 ml  Output   1375 ml  Net  -1375 ml   Labs: Basic Metabolic Panel:  Lab 04/06/12 4132 04/05/12 0545 04/04/12 0719 04/02/12 0750  NA 133* 138 137 --  K 4.4 4.3 3.8 --  CL 97 100 99 --  CO2 27 28 27  --  GLUCOSE 96 92 106* --  BUN 32* 16 27* --  CREATININE 4.72* 3.37* 5.76* --  CALCIUM 9.4 9.0 9.3 --  ALB -- -- -- --  PHOS 1.9* -- 2.5 3.3   Liver Function Tests:  Lab 04/06/12 1100 04/05/12 0545 04/04/12 0719  AST -- 27 --  ALT -- 11 --  ALKPHOS -- 96 --  BILITOT -- 0.7 --  PROT -- 6.3 --  ALBUMIN 1.8* 1.8* 1.8*   No results found for this basename: LIPASE:3,AMYLASE:3 in the last 168 hours  Lab 04/05/12 1135  AMMONIA 14   CBC:  Lab 04/06/12 1100 04/05/12 0545 04/04/12 0718 04/02/12 0750 04/01/12 0600  WBC 12.2* 10.2 10.1 -- --  NEUTROABS -- -- -- -- --  HGB 8.4* 8.9* 7.0* -- --  HCT 25.8* 27.0* 21.6* -- --  MCV 94.9 93.8 97.3 96.7 98.5  PLT 312 273 308 -- --   Cardiac Enzymes: No results found for this basename: CKTOTAL:5,CKMB:5,CKMBINDEX:5,TROPONINI:5 in the last 168 hours CBG:  Lab 04/04/12 1131  GLUCAP 92    Iron Studies: No results found for this basename: IRON,TIBC,TRANSFERRIN,FERRITIN in the last 72 hours Studies/Results: No results found. Medications:      . allopurinol  300 mg Oral Daily    . calcium acetate  667 mg Oral TID WC  . darbepoetin (ARANESP) injection - DIALYSIS  100 mcg Intravenous Q Sat-HD  . feeding supplement (NEPRO CARB STEADY)  237 mL Oral TID BM  . ferric gluconate (FERRLECIT/NULECIT) IV  125 mg Intravenous Q T,Th,Sa-HD  . lamiVUDine  50 mg Oral QHS  . lopinavir-ritonavir  5 mL Oral BID WC  . multivitamin  1 tablet Oral QHS  . mupirocin ointment   Nasal BID  . nebivolol  20 mg Oral QHS  . pantoprazole  40 mg Oral Q1200  . paricalcitol  2 mcg Intravenous 3 times weekly  . sodium chloride  3 mL Intravenous Q12H  . zidovudine  300 mg Oral QHS    I  have reviewed scheduled and prn medications.  Physical Exam  General: calm and comfortable, NAD  Heart: RRR, normal S1, S2  Lungs: diminished bases, no rhonchi, rales, or wheezes  G-U: foley intact, cloudy urine; wound not examined  Abdomen: colostomy LLQ; stoma pink; brown stool , mid abd wound separation quarter size and staples Extremities:  no ankle edema  Dialysis Access: RIJ PC intact   Assessment/Plan:  1. Psych/AMS - significantly improved; still has a sitter-will stop; hopefully with continue and remain; evolving plans for discharge to LTAC/[facility] v SNF 2. Fever/Leukoctyosis- remains afebrile off antibiotics; most recent WBC ^12.2; follow closely 3. ESRD - CLIP to [place] done but never signed papers; continue TTS schedule;  Next HD Tu; cont using RIJ PC (placed by [facility]); still needs permanent access some time in the near future (not placed due to infection)  4. Hypothyroidism- ? TSH 6.222; previous TSH 2.622 on 02/18/12 with thyroid panel on yesterday wnl 5. Hypertension/volume -controlled on Bystolic qhs with parameters to hold; no volume issues 6. Afib/Flutter- Echo w/ E.F. 60-65%, cardiology has signed off; not candidate for Warfarin anticoagulation; SCD's for DVT prophylaxis; no longer on telemetry 7. Anemia -Hgb down 8.4 yesterday; (was  ^8.9 s/p transfusion 2 units on Thu); remains on Aranesp 100  mcg/wk and completing IV iron bolus (x5 doses; 2 left after today-04/11/12); needs f/u (incl re-evaluate iron stores) 8. Metabolic bone disease - ca 9.4, phos now down 1.9 despite recent decrease Phoslo; will now D/C Phoslo; same Zemplar 2 mcg IV with HD; follow labs 9. Fournier's gangrene- s/p I&D in June; has foley;urology following  10. Nutrition - Alb remains 1.8 (has proteinuria), - severe malnutrition; appetite poor but intake improving; on D3 high protein renal (mech soft and thin liquids with supervision); continue encourage nepro supplements tid; follow 11. HIV - ID CD4. 180 6/8 12. MRSA + contact precautions - Bactroban/Contact Isolation 13. Hep C +/Hx of EtOH abuse        14. Disposition- discharge planning - his mentation has significantly improved and if continues, he may now be appropriate for outpatient dialysis; DNR/DNI.  Bryan Germany, FNP-C [facility] Pager [phone]  04/07/2012,10:23 AM  LOS: 9 days   Abdominal wound needs general surgery evaluation.  The colostomy was done at [facility], will ask [facility] to see.  He is improving.  Cont to mobilize.  [facility] contacted.  Bryan Gilmore C

## 2012-04-07 NOTE — Consult Note (Signed)
  I have been asked by Dr. Lowell Guitar to evaluate his abdominal wound. This gentleman had extensive surgery for Fournier's gangrene. He was then sent to [facility] for long-term vent care. At that time he had a diverting colostomy performed. He is now back in the hospital here for mental status changes. I have been asked to evaluate his abdominal wound.  On exam, his abdomen is soft. The ostomy was taken working well. His midline incision still has staples in place although most are falling out as the wound is totally healed except for an open area at the top. There is a small 1-1/2 cm round open wound at the top of his midline incision which is clean and shows no evidence of infection.   Impression: non healing surgical wound  Will have the staples removed. He does not meet Steri-Strips. I will order wet-to-dry dressing changes on his small open wound. There is nothing further to offer from a general surgical standpoint. Will see again as needed

## 2012-04-08 MED ORDER — HEPARIN SODIUM (PORCINE) 1000 UNIT/ML DIALYSIS
20.0000 [IU]/kg | INTRAMUSCULAR | Status: DC | PRN
  Administered 2012-04-09: 1800 [IU] via INTRAVENOUS_CENTRAL
  Filled 2012-04-08: qty 2

## 2012-04-08 MED ORDER — PRO-STAT SUGAR FREE PO LIQD
30.0000 mL | Freq: Three times a day (TID) | ORAL | Status: DC
  Administered 2012-04-09 – 2012-04-13 (×10): 30 mL via ORAL
  Filled 2012-04-08 (×15): qty 30

## 2012-04-08 NOTE — Progress Notes (Signed)
VASCULAR LAB Bilateral upper extremity vein mapping performed 02/21/12 and can be found under results. Please advise if new mapping is needed.  Farrel Demark, RDMS

## 2012-04-08 NOTE — Progress Notes (Signed)
[facility]  Pt had redo RIJ catheter placed by Dr. Arbie Cookey 03/29/12 after pt pulled his previous catheter. Pt now in need of HD perm access.  We will order vein mapping and schedule for access once this is complete.

## 2012-04-08 NOTE — Progress Notes (Signed)
INITIAL ADULT NUTRITION ASSESSMENT Date: 04/08/2012   Time: 11:34 AM  Reason for Assessment: Health History  ASSESSMENT: Male 61 y.o.  Dx: hyperkalemia  Hx:  Past Medical History  Diagnosis Date  . Hypertension   . HIV (human immunodeficiency virus infection)   . Sepsis   . Gout   . Hepatitis C   . Fournier's gangrene   . Anemia   . Severe protein-calorie malnutrition   . End stage renal disease   . Alcohol abuse    Past Surgical History  Procedure Date  . Cystoscopy with urethral dilatation 02/17/2012    Procedure: CYSTOSCOPY WITH URETHRAL DILATATION;  Surgeon: Martina Sinner, MD;  Location: [facility] ORS;  Service: Urology;  Laterality: N/A;  retrograde urethragram, placement of foley catheter exam under anesthesia   . Insertion of dialysis catheter 02/20/2012    Procedure: INSERTION OF DIALYSIS CATHETER;  Surgeon: Chuck Hint, MD;  Location: [facility] OR;  Service: Vascular;  Laterality: N/A;  . Irrigation and debridement abscess 02/29/2012    Procedure: IRRIGATION AND DEBRIDEMENT ABSCESS;  Surgeon: Martina Sinner, MD;  Location: [facility] OR;  Service: Urology;  Laterality: N/A;  I&D of scrotum abcess  . Insertion of dialysis catheter 03/29/2012    Procedure: INSERTION OF DIALYSIS CATHETER;  Surgeon: Larina Earthly, MD;  Location: [facility] OR;  Service: Vascular;  Laterality: N/A;  Insertion tunneled dialysis catheter right IJ   Related Meds:     . allopurinol  300 mg Oral Daily  . darbepoetin (ARANESP) injection - DIALYSIS  100 mcg Intravenous Q Sat-HD  . feeding supplement (NEPRO CARB STEADY)  237 mL Oral TID BM  . ferric gluconate (FERRLECIT/NULECIT) IV  125 mg Intravenous Q T,Th,Sa-HD  . lamiVUDine  50 mg Oral QHS  . lopinavir-ritonavir  5 mL Oral BID WC  . multivitamin  1 tablet Oral QHS  . mupirocin ointment   Nasal BID  . nebivolol  20 mg Oral QHS  . pantoprazole  40 mg Oral Q1200  . paricalcitol  2 mcg Intravenous 3 times weekly  . sodium chloride  3 mL Intravenous Q12H    . zidovudine  300 mg Oral QHS   Ht: 5\' 11"  (180.3 cm)  Wt: 198 lb 13.7 oz (90.2 kg) s/p HD on 7/28  Ideal Wt: 78.2 kg % Ideal Wt: 115%  Wt Readings from Last 15 Encounters:  04/07/12 198 lb 13.7 oz (90.2 kg)  04/07/12 198 lb 13.7 oz (90.2 kg)  04/07/12 198 lb 13.7 oz (90.2 kg)  02/28/12 238 lb 4.8 oz (108.092 kg)  02/28/12 238 lb 4.8 oz (108.092 kg)  02/28/12 238 lb 4.8 oz (108.092 kg)  02/28/12 238 lb 4.8 oz (108.092 kg)  02/28/12 238 lb 4.8 oz (108.092 kg)  02/28/12 238 lb 4.8 oz (108.092 kg)  05/02/11 242 lb (109.77 kg)  Usual Wt: 238 - 242 lb % Usual Wt: 83%; 17% wt change x 1.5 months  Body mass index is 27.73 kg/(m^2). Overweight.  Food/Nutrition Related Hx: from [facility] and SNF  Labs:  CMP     Component Value Date/Time   NA 133* 04/06/2012 1100   K 4.4 04/06/2012 1100   CL 97 04/06/2012 1100   CO2 27 04/06/2012 1100   GLUCOSE 96 04/06/2012 1100   BUN 32* 04/06/2012 1100   CREATININE 4.72* 04/06/2012 1100   CREATININE 4.51* 04/17/2011 1521   CALCIUM 9.4 04/06/2012 1100   PROT 6.3 04/05/2012 0545   ALBUMIN 1.8* 04/06/2012 1100   AST 27 04/05/2012  0545   ALT 11 04/05/2012 0545   ALKPHOS 96 04/05/2012 0545   BILITOT 0.7 04/05/2012 0545   GFRNONAA 12* 04/06/2012 1100   GFRAA 14* 04/06/2012 1100   Phosphorus  Date/Time Value Range Status  04/06/2012 11:00 AM 1.9* 2.3 - 4.6 mg/dL Final  7/82/9562  1:30 AM 2.5  2.3 - 4.6 mg/dL Final  8/65/7846  9:62 AM 3.3  2.3 - 4.6 mg/dL Final   Potassium  Date/Time Value Range Status  04/06/2012 11:00 AM 4.4  3.5 - 5.1 mEq/L Final  04/05/2012  5:45 AM 4.3  3.5 - 5.1 mEq/L Final  04/04/2012  7:19 AM 3.8  3.5 - 5.1 mEq/L Final   Magnesium  Date/Time Value Range Status  04/05/2012 11:30 AM 2.0  1.5 - 2.5 mg/dL Final  9/52/8413  2:44 AM 2.4  1.5 - 2.5 mg/dL Final  0/06/2724  3:66 AM 2.4  1.5 - 2.5 mg/dL Final   PTH  Date/Time Value Range Status  03/31/2012  5:26 AM 14.0  14.0 - 72.0 pg/mL Final     Result repeated and verified.     Intake/Output Summary (Last 24 hours) at 04/08/12 1136 Last data filed at 04/07/12 2000  Gross per 24 hour  Intake    480 ml  Output    750 ml  Net   -270 ml  BM on 7/28  Diet Order: Dysphagia 3, Renal 80 - 90, 1200 ml Fluid Restrictions  Supplements/Tube Feeding: Nepro Shake PO TID between meals, Rena-Vit  IVF:    Estimated Nutritional Needs:   Kcal: 2150 - 2250 kcal Protein: 100 - 120 grams Fluid:  1.2 liters daily  Pt with ESRD since June 2013. Recently d/c from [facility] to [facility]. Treated there for sepsis 2/2 Fournier's gangrene s/p debridement. Currently admitted for perineal wound, dislodged HD cath and agitation/confusion. Pt pulled out his HD catheter while at outpatient HD center.  Pt on remeron upon admission. Currently eating 100% of meals. RN reports pt is eating well, unable to tell this RD if pt is taking Nepro Shakes.  Pt with non-healing surgical wound, per MD.  This RD has seen during June 2013. Pt was dx with moderate protein - calorie malnutrition at that time. Pt has had 17% wt loss x 1.5 months. Noted that this wt loss has occurred since initiation of HD. This wt loss is signficant. Suspect malnutrition is ongoing. Pt meets criteria for moderate malnutrition in the context of chronic illness as evidenced by wt loss of 17% x 1.5 months, suspect intake of < 75% x 1 month, and likely mild body fat/muscle mass loss.  NUTRITION DIAGNOSIS: -Increased nutrient needs (NI-5.1).  Status: Ongoing  RELATED TO: HD  AS EVIDENCE BY: estimated needs.  MONITORING/EVALUATION(Goals): Goal: Pt to meet >/= 90% of their estimated nutrition needs Monitor: weights, labs, PO intake  EDUCATION NEEDS: -Education not appropriate at this time  INTERVENTION: 1. Continue Nepro Shakes - to continue to help meet protein and kcal needs 2. Add 30 ml Prostat PO TID 3. RD to continue to follow nutrition care plan  Jarold Motto MS, RD, LDN Pager:  [phone] After-hours pager: [phone]   DOCUMENTATION CODES Per approved criteria  -Non-severe (moderate) malnutrition in the context of chronic illness

## 2012-04-08 NOTE — Progress Notes (Signed)
Physical Therapy Treatment Patient Details Name: Bryan Gilmore MRN: [id number] DOB: October 23, 1950 Today's Date: 04/08/2012 Time: 5284-1324 PT Time Calculation (min): 27 min  PT Assessment / Plan / Recommendation Comments on Treatment Session  Pt adm with hyperkalemia needing emergent dialysis access and dialysis. Recent [facility] and SNF stay s/p sepsis and diverting colostomy for fourniere's gangrene (and continues with scrotal wound). Pt is benefiting from PT with decr dependence in mobility; however still with many safety issues and weakness to address.    Follow Up Recommendations  Skilled nursing facility;Supervision/Assistance - 24 hour    Barriers to Discharge        Equipment Recommendations  Defer to next venue    Recommendations for Other Services    Frequency Min 3X/week   Plan Discharge plan remains appropriate;Frequency remains appropriate    Precautions / Restrictions Precautions Precautions: Fall Precaution Comments: Scrotal wound, colostomy   Pertinent Vitals/Pain     Mobility  Bed Mobility Bed Mobility: Supine to Sit;Sitting - Scoot to Edge of Bed Rolling Right: 5: Supervision;With rail Right Sidelying to Sit: 4: Min guard;HOB flat;With rails Sitting - Scoot to Edge of Bed: 5: Supervision Details for Bed Mobility Assistance: Pt required cues to sequence; supervison for safety due to decr cognition Transfers Transfers: Sit to Stand;Stand to Sit Sit to Stand: 4: Min assist;With upper extremity assist;From bed Stand to Sit: 4: Min guard;With upper extremity assist;With armrests;To chair/3-in-1 Details for Transfer Assistance: cues for safe hand placement and use of RW; assist to translate forward over BOS and then transition to vertical/upright Ambulation/Gait Ambulation/Gait Assistance: 4: Min assist Ambulation Distance (Feet): 25 Feet Assistive device: Rolling walker Ambulation/Gait Assistance Details: Pt initially unsteady with short step length and difficulty  advancing LEs; as pt progressed, his stride length improved and required less assist for balance Gait Pattern: Step-to pattern;Decreased stride length;Right foot flat;Left foot flat;Right flexed knee in stance;Left flexed knee in stance;Shuffle;Trunk flexed    Exercises General Exercises - Lower Extremity Straight Leg Raises: AROM;Both;10 reps;Supine Low Level/ICU Exercises Stabilized Bridging: AROM;Both;10 reps;Supine;Other (comment) (5 second hold at top)   PT Diagnosis:    PT Problem List:   PT Treatment Interventions:     PT Goals Acute Rehab PT Goals Pt will go Supine/Side to Sit: with supervision PT Goal: Supine/Side to Sit - Progress: Progressing toward goal Pt will go Sit to Stand: with min assist PT Goal: Sit to Stand - Progress: Progressing toward goal Pt will go Stand to Sit: with min assist PT Goal: Stand to Sit - Progress: Progressing toward goal Pt will Transfer Bed to Chair/Chair to Bed: with min assist PT Transfer Goal: Bed to Chair/Chair to Bed - Progress: Progressing toward goal Pt will Ambulate: 51 - 150 feet;with min assist;with least restrictive assistive device PT Goal: Ambulate - Progress: Progressing toward goal  Visit Information  Last PT Received On: 04/08/12 Assistance Needed: +1    Subjective Data  Subjective: "They took some skin off my legs this morning and now it's itching" Patient Stated Goal: pt. not able to provide goals.  Agreeable with mobilizing with PT/OT.   Cognition  Overall Cognitive Status: Impaired Area of Impairment: Memory;Awareness of deficits Arousal/Alertness: Awake/alert Orientation Level: Disoriented to;Place;Time;Situation Behavior During Session: [facility] for tasks performed Current Attention Level: Selective Attention - Other Comments: Attending to PT and nurse tech Memory Deficits: Unable to state reasons for hospital admission Safety/Judgement: Decreased awareness of need for assistance    Balance     End of Session PT -  End of Session Equipment Utilized During Treatment: Gait belt Activity Tolerance: Patient limited by fatigue Patient left: in chair;with call bell/phone within reach   GP     Jacquelin Krajewski 04/08/2012, 3:56 PM Pager [phone]

## 2012-04-08 NOTE — Progress Notes (Signed)
[facility] Progress Note  Subjective:  Eating ok. Denies pain.  Objective Filed Vitals:   04/07/12 1519 04/07/12 1750 04/07/12 2043 04/08/12 0549  BP: 113/79 142/86 114/67 113/70  Pulse: 72 61 88 74  Temp: 98.6 F (37 C) 98 F (36.7 C) 98.3 F (36.8 C) 97.9 F (36.6 C)  TempSrc: Oral Oral Oral Oral  Resp: 18 20 18 18   Height:      Weight:   90.2 kg (198 lb 13.7 oz)   SpO2: 100%  100% 100%   Physical Exam General: alert feeding self breakfast, oriented x 3 Heart: irreg irreg Lungs: no rales or wheezes  Abdomen: soft ; colostomy intact Extremities: no LE edema Neuro:  [facility], July 19 ??; [facility] Access: right IJ cath  Assessment/Plan:  1. Psych/encephalopathy - significantly improved;  evolving plans for discharge to LTAC/[facility] v SNF -  2. Fever/Leukoctyosis- remains afebrile off antibiotics; most recent WBC ^12.2; follow closely 3. ESRD -  Plan HD Tuesday in recliner to see how he does; CLIP to [place] done but never signed papers; continue TTS schedule; Next HD Tu; cont using RIJ PC (placed by [facility]); still needs permanent access not placed due to infection >> needs perm access, we should ask [facility] to do this during this hospitalization 4. Deconditioning - PT 3x weekly minimum - needs daily if able to participate 5. Hypothyroidism- ? TSH 6.222; previous TSH 2.622 on 02/18/12 with thyroid panel on yesterday wnl 6. Hypertension/volume -controlled on Bystolic qhs with parameters to hold; no volume issues 7. Afib/Flutter- Echo w/ E.F. 60-65%, cardiology has signed off; not candidate for Warfarin anticoagulation; SCD's for DVT prophylaxis; no longer on telemetry 8. Anemia -Hgb down 8.4 yesterday; (was ^8.9 s/p transfusion 2 units on Thu); on Aranesp 100 mcg/wk and completing IV iron bolus (x5 doses; 2 left;   if Hgb continues to trend down, will increase Aranesp and recheck Fe 9. Metabolic bone disease - ca 9.4, phos now down 1.9 despite recent decrease  Phoslo; will now D/C Phoslo;iPTH 930 6/0 and 14 7/21 - significant variance - recheck; decrease zemplar to 1 mq per week until iPTH results back (prev Z 2 tiw); recheck Ca/P with Tuesday HD 10. Fournier's gangrene/scrotal wound- s/p I&D in June; has foley - urology replaced foley/ wound care 11. Nutrition - Alb remains 1.8 (has proteinuria), - severe malnutrition; appetite poor but intake improving; on D3 high protein renal (mech soft and thin liquids with supervision); continue encourage nepro supplements tid 12. HIV - ID CD4. 180 6/8 (has follow up scheduled with outpt ID tomorrow; I will call to change as he will be hospitalized); continue current antiretroviral regimen per ID 13. MRSA + contact precautions - Bactroban/Contact Isolation 14. Hep C +/Hx of EtOH abuse       14. Disposition- discharge planning - possible LTAC vs SNF; needs to be able to sit independently in  recliner; DNR/DNI. Appreciate SW involvement.   Sheffield Slider, PA-C [facility] Beeper [phone]  04/08/2012,7:46 AM  LOS: 10 days   Patient seen and examined and agree with assessment and plan as above with additions as indicated.  Vinson Moselle  MD [facility] [phone] pgr    [phone] cell 04/08/2012, 12:11 PM      Additional Objective Labs: Basic Metabolic Panel:  Lab 04/06/12 8756 04/05/12 0545 04/04/12 0719 04/02/12 0750  NA 133* 138 137 --  K 4.4 4.3 3.8 --  CL 97 100 99 --  CO2 27 28 27  --  GLUCOSE 96 92 106* --  BUN 32* 16 27* --  CREATININE 4.72* 3.37* 5.76* --  CALCIUM 9.4 9.0 9.3 --  ALB -- -- -- --  PHOS 1.9* -- 2.5 3.3   Liver Function Tests:  Lab 04/06/12 1100 04/05/12 0545 04/04/12 0719  AST -- 27 --  ALT -- 11 --  ALKPHOS -- 96 --  BILITOT -- 0.7 --  PROT -- 6.3 --  ALBUMIN 1.8* 1.8* 1.8*  CBC:  Lab 04/06/12 1100 04/05/12 0545 04/04/12 0718 04/02/12 0750  WBC 12.2* 10.2 10.1 --  NEUTROABS -- -- -- --  HGB 8.4* 8.9* 7.0* --  HCT 25.8* 27.0*  21.6* --  MCV 94.9 93.8 97.3 96.7  PLT 312 273 308 --   Blood Culture    Component Value Date/Time   SDES BLOOD RIGHT ARM 04/04/2012 2216   SPECREQUEST BOTTLES DRAWN AEROBIC AND ANAEROBIC 10CC 04/04/2012 2216   CULT        BLOOD CULTURE RECEIVED NO GROWTH TO DATE CULTURE WILL BE HELD FOR 5 DAYS BEFORE ISSUING A FINAL NEGATIVE REPORT 04/04/2012 2216   REPTSTATUS PENDING 04/04/2012 2216  Medications:      . allopurinol  300 mg Oral Daily  . darbepoetin (ARANESP) injection - DIALYSIS  100 mcg Intravenous Q Sat-HD  . feeding supplement (NEPRO CARB STEADY)  237 mL Oral TID BM  . ferric gluconate (FERRLECIT/NULECIT) IV  125 mg Intravenous Q T,Th,Sa-HD  . lamiVUDine  50 mg Oral QHS  . lopinavir-ritonavir  5 mL Oral BID WC  . multivitamin  1 tablet Oral QHS  . mupirocin ointment   Nasal BID  . nebivolol  20 mg Oral QHS  . pantoprazole  40 mg Oral Q1200  . paricalcitol  2 mcg Intravenous 3 times weekly  . sodium chloride  3 mL Intravenous Q12H  . zidovudine  300 mg Oral QHS  . DISCONTD: calcium acetate  667 mg Oral TID WC

## 2012-04-09 ENCOUNTER — Inpatient Hospital Stay (HOSPITAL_COMMUNITY): Payer: BC Managed Care – PPO

## 2012-04-09 ENCOUNTER — Ambulatory Visit: Payer: BC Managed Care – PPO | Admitting: Internal Medicine

## 2012-04-09 LAB — HEPATITIS B SURFACE ANTIGEN: Hepatitis B Surface Ag: NEGATIVE

## 2012-04-09 LAB — CBC
Hemoglobin: 8.4 g/dL — ABNORMAL LOW (ref 13.0–17.0)
MCV: 94.7 fL (ref 78.0–100.0)
Platelets: 326 10*3/uL (ref 150–400)
RBC: 2.64 MIL/uL — ABNORMAL LOW (ref 4.22–5.81)
WBC: 12.2 10*3/uL — ABNORMAL HIGH (ref 4.0–10.5)

## 2012-04-09 LAB — RENAL FUNCTION PANEL
CO2: 22 mEq/L (ref 19–32)
Chloride: 97 mEq/L (ref 96–112)
Creatinine, Ser: 5.8 mg/dL — ABNORMAL HIGH (ref 0.50–1.35)
GFR calc Af Amer: 11 mL/min — ABNORMAL LOW (ref >90)
GFR calc non Af Amer: 9 mL/min — ABNORMAL LOW (ref >90)
Glucose, Bld: 102 mg/dL — ABNORMAL HIGH (ref 70–99)

## 2012-04-09 MED ORDER — PARICALCITOL 5 MCG/ML IV SOLN
INTRAVENOUS | Status: AC
  Administered 2012-04-09: 2 ug via INTRAVENOUS
  Filled 2012-04-09: qty 1

## 2012-04-09 NOTE — Clinical Social Work Placement (Addendum)
    Clinical Social Work Department CLINICAL SOCIAL WORK PLACEMENT NOTE 04/09/2012  Patient:  Bryan Gilmore, Bryan Gilmore  Account Number:  [id number] Admit date:  03/29/2012  Clinical Social Worker:  Genelle Bal, LCSW  Date/time:  04/09/2012 04:25 AM  Clinical Social Work is seeking post-discharge placement for this patient at the following level of care:   SKILLED NURSING   (*CSW will update this form in Epic as items are completed)     Patient/family provided with [name] Department of Clinical Social Work's list of facilities offering this level of care within the geographic area requested by the patient (or if unable, by the patient's family).  04/09/2012  Patient/family informed of their freedom to choose among providers that offer the needed level of care, that participate in Medicare, Medicaid or managed care program needed by the patient, have an available bed and are willing to accept the patient.    Patient/family informed of [facility]' ownership interest in [facility], as well as of the fact that they are under no obligation to receive care at this facility.  PASARR submitted to EDS on 02/27/2012 PASARR number received from EDS on 02/29/2012  FL2 transmitted to all facilities in geographic area requested by pt/family on  04/09/2012 FL2 transmitted to all facilities within larger geographic area on   Patient informed that his/her managed care company has contracts with or will negotiate with  certain facilities, including the following:     Patient/family informed of bed offers received:  04/09/2012 Patient chooses bed at  [facility] Physician recommends and patient chooses bed at    Patient to be transferred to [facility] 04/13/2012 Patient to be transferred to facility by [facility] 04/13/2012  The following physician request were entered in Epic:   Additional Comments: 04/09/12 - CSW contacted nephew Aqil Goetting at [phone] (work cell). He will talk with family  and get back with CSW on Wed., 7/31.

## 2012-04-09 NOTE — Clinical Social Work Note (Signed)
CSW progressing in terms of his mental status and today he is having dialysis treatments sitting up unrestrained. It is hoped that he will have a successful dialysis treatment today and will be able to continue dialysis unrestrained and with no behavior issues. If so patient will be appropriate for skilled facility placement versus LTAC. Clinical information sent to [facility] and other facilities in [place]. CSW will continue to monitor patient progress and assist with discharge to SNF when medically stable.  Genelle Bal, MSW, LCSW [phone]

## 2012-04-09 NOTE — Progress Notes (Signed)
[facility] Progress Note  Subjective:  Prefers HD in bed, but he can get used to the recliner.  Objective Filed Vitals:   04/09/12 0714 04/09/12 0745 04/09/12 0800 04/09/12 0815  BP: 142/72 133/78 125/72 143/79  Pulse: 76 69 71 75  Temp:      TempSrc:      Resp:      Height:      Weight:      SpO2:       Physical Exam General: NAD on HD, cooperative  Heart: RRR Lungs: no wheezes or rales Abdomen: soft NT Extremities:  No LE edema Neuro:  [place], 2013, [facility] (hosp in [place] where he used to live) Dialysis Access: right IJ Qb 400 goal 3.5  Assessment/Plan: 1. Psych/encephalopathy - significantly improved; I think he can be d/c to SNF 2. Fever/Leukoctyosis- remains afebrile off antibiotics;WBC stable at 12 K 3. ESRD - Tolerating HD in recliner. CLIP to [place] done but never signed papers; continue TTS schedule; Next HD Tu; cont using RIJ PC (placed by VVS); Vein mapping done in June. VVS consulted for access placement.- hopefully Wednesday and possible discharge afterwards ?   4. Deconditioning - PT 3x weekly minimum - needs daily if able to participate 5. Hypothyroidism- ? TSH 6.222; previous TSH 2.622 on 02/18/12 with thyroid panel on yesterday wnl; no meds 6. Hypertension/volume -controlled on Bystolic qhs with parameters to hold; no volume issues 7. Afib/Flutter- Echo w/ E.F. 60-65%, cardiology has signed off; not candidate for Warfarin anticoagulation; SCD's for DVT prophylaxis; no longer on telemetry 8. Anemia -Hgb 8.4 x 2 ; (was ^8.9 s/p transfusion 2 units on last week); on Aranesp 100 mcg/wk and completing IV iron bolus (x5 doses; 1 left; if Hgb continues to trend down, will increase Aranesp and recheck Fe 9. Metabolic bone disease - ca 9.4, phos now down to 1.9 despite recent decrease Phoslo;  Phoslo d/c 7/29 P now 3.6; monitor off binders;iPTH 930 6/13 and 14 7/21 - significant variance - recheck; decrease zemplar to 1 mq per week until iPTH  results back (prev Z 2 tiw); recheck Ca/P with Tuesday HD 10. Fournier's gangrene/scrotal wound- s/p I&D in June; has foley - urology replaced foley/ wound care 11. Nutrition - Alb remains 2.0  (has proteinuria), - severe malnutrition; appetite poor but intake improving; on D3 high protein renal (mech soft and thin liquids with supervision); continue encourage nepro and protein supplements tid 12. HIV - ID CD4. 180 6/8;  continue current antiretroviral regimen per ID; need to reschedule hospital follow-up appt with ID clinc at discharge. 13. MRSA + contact precautions - Bactroban/Contact Isolation 14. Hep C +/Hx of EtOH abuse       15. Disposition- discharge planning - SNF appropriate at this time.  DNR/DNI. Appreciate SW involvement.   Sheffield Slider, PA-C [facility] Beeper [phone]  04/09/2012,8:41 AM  LOS: 11 days   Patient seen and examined and agree with assessment and plan as above. Consulting VVS for access placement, then should be ready to d/c to SNF thereafter.  Vinson Moselle  MD [facility] [phone] pgr    [phone] cell 04/09/2012, 11:54 AM     Additional Objective Labs: Basic Metabolic Panel:  Lab 04/09/12 4696 04/06/12 1100 04/05/12 0545 04/04/12 0719  NA 134* 133* 138 --  K 4.1 4.4 4.3 --  CL 97 97 100 --  CO2 22 27 28  --  GLUCOSE 102* 96 92 --  BUN 52* 32*  16 --  CREATININE 5.80* 4.72* 3.37* --  CALCIUM 9.5 9.4 9.0 --  ALB -- -- -- --  PHOS 3.6 1.9* -- 2.5   Liver Function Tests:  Lab 04/09/12 0738 04/06/12 1100 04/05/12 0545  AST -- -- 27  ALT -- -- 11  ALKPHOS -- -- 96  BILITOT -- -- 0.7  PROT -- -- 6.3  ALBUMIN 2.0* 1.8* 1.8*  CBC:  Lab 04/09/12 0738 04/06/12 1100 04/05/12 0545 04/04/12 0718  WBC 12.2* 12.2* 10.2 --  NEUTROABS -- -- -- --  HGB 8.4* 8.4* 8.9* --  HCT 25.0* 25.8* 27.0* --  MCV 94.7 94.9 93.8 97.3  PLT 326 312 273 --   Blood Culture    Component Value Date/Time   SDES BLOOD RIGHT ARM  04/04/2012 2216   SPECREQUEST BOTTLES DRAWN AEROBIC AND ANAEROBIC 10CC 04/04/2012 2216   CULT        BLOOD CULTURE RECEIVED NO GROWTH TO DATE CULTURE WILL BE HELD FOR 5 DAYS BEFORE ISSUING A FINAL NEGATIVE REPORT 04/04/2012 2216   REPTSTATUS PENDING 04/04/2012 2216  Medications:      . allopurinol  300 mg Oral Daily  . darbepoetin (ARANESP) injection - DIALYSIS  100 mcg Intravenous Q Sat-HD  . feeding supplement (NEPRO CARB STEADY)  237 mL Oral TID BM  . feeding supplement  30 mL Oral TID WC  . ferric gluconate (FERRLECIT/NULECIT) IV  125 mg Intravenous Q T,Th,Sa-HD  . lamiVUDine  50 mg Oral QHS  . lopinavir-ritonavir  5 mL Oral BID WC  . multivitamin  1 tablet Oral QHS  . mupirocin ointment   Nasal BID  . nebivolol  20 mg Oral QHS  . pantoprazole  40 mg Oral Q1200  . paricalcitol  2 mcg Intravenous 3 times weekly  . sodium chloride  3 mL Intravenous Q12H  . zidovudine  300 mg Oral QHS

## 2012-04-09 NOTE — Progress Notes (Signed)
Utilization review completed.  

## 2012-04-10 LAB — PTH, INTACT AND CALCIUM: Calcium, Total (PTH): 9.2 mg/dL (ref 8.4–10.5)

## 2012-04-10 MED ORDER — HEPARIN SODIUM (PORCINE) 1000 UNIT/ML DIALYSIS
20.0000 [IU]/kg | INTRAMUSCULAR | Status: DC | PRN
  Administered 2012-04-11 – 2012-04-13 (×2): 1700 [IU] via INTRAVENOUS_CENTRAL
  Filled 2012-04-10: qty 2

## 2012-04-10 NOTE — Progress Notes (Signed)
Physical Therapy Treatment Patient Details Name: Bryan Gilmore MRN: [id number] DOB: [birth year] Today's Date: 04/10/2012 Time: 9528-4132 PT Time Calculation (min): 19 min  PT Assessment / Plan / Recommendation Comments on Treatment Session  Mentation and participation significantly improved; Making good progress with mbility as well    Follow Up Recommendations  Skilled nursing facility;Supervision/Assistance - 24 hour    Barriers to Discharge        Equipment Recommendations  Defer to next venue    Recommendations for Other Services    Frequency Min 3X/week   Plan Discharge plan remains appropriate;Frequency remains appropriate    Precautions / Restrictions Precautions Precautions: Fall Precaution Comments: Scrotal wound, colostomy   Pertinent Vitals/Pain Report significant itching bil LEs and feet; applied lotion    Mobility  Bed Mobility Bed Mobility: Supine to Sit;Sitting - Scoot to Edge of Bed Supine to Sit: 4: Min guard;With rails;HOB elevated Sitting - Scoot to Edge of Bed: 5: Supervision Details for Bed Mobility Assistance: Cues for technique Transfers Transfers: Sit to Stand;Stand to Sit Sit to Stand: 4: Min assist;With upper extremity assist;From bed Stand to Sit: 4: Min guard;With upper extremity assist;With armrests;To chair/3-in-1 Details for Transfer Assistance: cues for safe hand placement and use of RW; assist to translate forward over BOS and then transition to vertical/upright Ambulation/Gait Ambulation/Gait Assistance: 4: Min guard Ambulation Distance (Feet): 45 Feet Assistive device: Rolling walker Ambulation/Gait Assistance Details: Cues for posture, and to self-monitor for activity tol; Making good progress    Exercises     PT Diagnosis:    PT Problem List:   PT Treatment Interventions:     PT Goals Acute Rehab PT Goals Time For Goal Achievement: 04/17/12 Potential to Achieve Goals: Good Pt will go Supine/Side to Sit: with supervision PT  Goal: Supine/Side to Sit - Progress: Progressing toward goal Pt will go Sit to Stand: with min assist PT Goal: Sit to Stand - Progress: Partly met Pt will go Stand to Sit: with min assist PT Goal: Stand to Sit - Progress: Partly met Pt will Ambulate: 51 - 150 feet;with min assist;with least restrictive assistive device PT Goal: Ambulate - Progress: Progressing toward goal  Visit Information  Last PT Received On: 04/10/12 Assistance Needed: +1    Subjective Data  Subjective: Agreeable to OOB   Cognition  Arousal/Alertness: Awake/alert Orientation Level: Appears intact for tasks assessed Behavior During Session: [facility] for tasks performed    Balance     End of Session PT - End of Session Equipment Utilized During Treatment: Gait belt Activity Tolerance: Patient tolerated treatment well Patient left: in chair;with call bell/phone within reach Nurse Communication: Mobility status   GP     Olen Pel [place], [place] [phone]  04/10/2012, 1:23 PM

## 2012-04-10 NOTE — Progress Notes (Signed)
[name] Progress Note  Subjective: No c/o.  Up with walker yesterday!! Ate 100% breakfast  Objective Filed Vitals:   04/09/12 1510 04/09/12 1646 04/09/12 2236 04/10/12 0612  BP: 115/70 133/72 116/80 135/91  Pulse: 73 68 70 65  Temp: 98.3 F (36.8 C) 97.8 F (36.6 C) 99.3 F (37.4 C) 97.7 F (36.5 C)  TempSrc: Oral Oral Oral Oral  Resp: 18 18 17 17   Height:      Weight:   86.6 kg (190 lb 14.7 oz)   SpO2: 100% 100% 100% 100%   Physical Exam General: sleepy, rouses easily Heart: RRR Lungs: no wheezes or rales Abdomen: soft Extremities: no LE edema Dialysis Access: right IJ Qb 400 Other: foley small amount clear amber urine  Assessment/Plan:  1. Psych/encephalopathy - significantly improved- ok for discharge to SNF; if he does not go back to [facility], need to check to be sure they transport to [facility] 2. Fever/Leukoctyosis- remains afebrile off antibiotics;WBC stable at 12 K 3. ESRD - Tolerated HD in recliner. CLIP to [place] done but never signed papers; continue TTS schedule; Next HD Thursdy; cont using RIJ PC (placed by VVS); Vein mapping done in June. VVS consulted for access placement.- discussed with VVS PA today to investigate plans on access placement.  Would like done before discharge. 4. Deconditioning - PT 3x weekly minimum - needs daily if able to participate 5. Hypothyroidism- ?TSH 6.222; previous TSH 2.622 on 02/18/12 with thyroid panel on yesterday wnl; no meds 6. Hypertension/volume -controlled on Bystolic qhs with parameters to hold; no volume issues 7. Afib/Flutter- Echo w/ E.F. 60-65%, cardiology has signed off; not candidate for Warfarin anticoagulation; SCD's for DVT prophylaxis; no longer on telemetry 8. Anemia -Hgb 8.4 x 2 ; (was ^8.9 s/p transfusion 2 units on last week); on Aranesp 100 mcg/wk and completing IV iron bolus (x5 doses); 1 left; if Hgb continues to trend down, will increase Aranesp and recheck Fe 9. Metabolic bone  disease - ca 9.4, phos now down to 1.9 despite recent decrease Phoslo; Phoslo d/c 7/29 P now 3.6; monitor off binders;iPTH 930 6/13 and 14 7/21 - significant variance - recheck; decrease zemplar to 1 mq per week until iPTH results back (prev Zemplar 2 tiw); recheck Ca/P with Tuesday HD 10. Fournier's gangrene/scrotal wound- s/p I&D in June; has foley - urology replaced foley/ wound care 11. Nutrition - Alb remains 2.0 (has proteinuria), - severe malnutrition; appetite poor but intake improving; on D3 high protein renal (mech soft and thin liquids with supervision); continue encourage nepro and protein supplements tid 12. HIV - ID CD4. 180 6/8; continue current antiretroviral regimen per ID; need to reschedule hospital follow-up appt with ID clinc at discharge. 13. MRSA + contact precautions - Bactroban/Contact Isolation 14. Hep C +/Hx of EtOH abuse       15. Disposition- discharge planning - SNF appropriate at this time. DNR/DNI. Appreciate SW involvement.    Bryan Slider, PA-C [facility] Beeper [phone]  04/10/2012,8:38 AM  LOS: 12 days   Patient seen and examined and agree with assessment and plan as above.  Bryan Moselle  MD [facility] [phone] pgr    [phone] cell 04/10/2012, 11:46 AM     Additional Objective Labs: Basic Metabolic Panel:  Lab 04/09/12 4696 04/06/12 1100 04/05/12 0545 04/04/12 0719  NA 134* 133* 138 --  K 4.1 4.4 4.3 --  CL 97 97 100 --  CO2 22 27 28  --  GLUCOSE 102* 96 92 --  BUN 52* 32* 16 --  CREATININE 5.80* 4.72* 3.37* --  CALCIUM 9.5 9.4 9.0 --  ALB -- -- -- --  PHOS 3.6 1.9* -- 2.5   Liver Function Tests:  Lab 04/09/12 0738 04/06/12 1100 04/05/12 0545  AST -- -- 27  ALT -- -- 11  ALKPHOS -- -- 96  BILITOT -- -- 0.7  PROT -- -- 6.3  ALBUMIN 2.0* 1.8* 1.8*  CBC:  Lab 04/09/12 0738 04/06/12 1100 04/05/12 0545 04/04/12 0718  WBC 12.2* 12.2* 10.2 --  NEUTROABS -- -- -- --  HGB 8.4* 8.4* 8.9* --  HCT 25.0*  25.8* 27.0* --  MCV 94.7 94.9 93.8 97.3  PLT 326 312 273 --   Blood Culture    Component Value Date/Time   SDES BLOOD RIGHT ARM 04/04/2012 2216   SPECREQUEST BOTTLES DRAWN AEROBIC AND ANAEROBIC 10CC 04/04/2012 2216   CULT        BLOOD CULTURE RECEIVED NO GROWTH TO DATE CULTURE WILL BE HELD FOR 5 DAYS BEFORE ISSUING A FINAL NEGATIVE REPORT 04/04/2012 2216   REPTSTATUS PENDING 04/04/2012 2216   Medications:      . allopurinol  300 mg Oral Daily  . darbepoetin (ARANESP) injection - DIALYSIS  100 mcg Intravenous Q Sat-HD  . feeding supplement (NEPRO CARB STEADY)  237 mL Oral TID BM  . feeding supplement  30 mL Oral TID WC  . ferric gluconate (FERRLECIT/NULECIT) IV  125 mg Intravenous Q T,Th,Sa-HD  . lamiVUDine  50 mg Oral QHS  . lopinavir-ritonavir  5 mL Oral BID WC  . multivitamin  1 tablet Oral QHS  . mupirocin ointment   Nasal BID  . nebivolol  20 mg Oral QHS  . pantoprazole  40 mg Oral Q1200  . paricalcitol  2 mcg Intravenous 3 times weekly  . sodium chloride  3 mL Intravenous Q12H  . zidovudine  300 mg Oral QHS

## 2012-04-11 ENCOUNTER — Inpatient Hospital Stay (HOSPITAL_COMMUNITY): Payer: BC Managed Care – PPO

## 2012-04-11 LAB — CULTURE, BLOOD (ROUTINE X 2): Culture: NO GROWTH

## 2012-04-11 LAB — RENAL FUNCTION PANEL
CO2: 24 mEq/L (ref 19–32)
Calcium: 9.3 mg/dL (ref 8.4–10.5)
Creatinine, Ser: 5.33 mg/dL — ABNORMAL HIGH (ref 0.50–1.35)
Glucose, Bld: 108 mg/dL — ABNORMAL HIGH (ref 70–99)

## 2012-04-11 LAB — CBC
HCT: 24.4 % — ABNORMAL LOW (ref 39.0–52.0)
Hemoglobin: 8.2 g/dL — ABNORMAL LOW (ref 13.0–17.0)
MCH: 31.8 pg (ref 26.0–34.0)
MCHC: 33.6 g/dL (ref 30.0–36.0)

## 2012-04-11 MED ORDER — PARICALCITOL 5 MCG/ML IV SOLN
INTRAVENOUS | Status: AC
  Administered 2012-04-11: 2 ug via INTRAVENOUS
  Filled 2012-04-11: qty 1

## 2012-04-11 MED ORDER — DEXTROSE 5 % IV SOLN
1.5000 g | INTRAVENOUS | Status: AC
  Administered 2012-04-12: 1.5 g via INTRAVENOUS
  Filled 2012-04-11: qty 1.5

## 2012-04-11 MED ORDER — CALCIUM ACETATE 667 MG PO CAPS
667.0000 mg | ORAL_CAPSULE | Freq: Two times a day (BID) | ORAL | Status: DC
  Administered 2012-04-11 – 2012-04-13 (×3): 667 mg via ORAL
  Filled 2012-04-11 (×6): qty 1

## 2012-04-11 NOTE — Procedures (Signed)
I was present at this dialysis session. I have reviewed the session itself and made appropriate changes.   Rob Labarron Durnin, MD [facility] 04/11/2012, 1:16 PM   

## 2012-04-11 NOTE — Progress Notes (Signed)
VVS  Pt vein mapping from June reviewed by Dr. Hart Rochester - possible left BVT versus graft.  We will try to schedule tomorrow but may have to be on Monday.

## 2012-04-11 NOTE — Clinical Social Work Note (Addendum)
LATE NOTE FOR 04/10/12: CSW received call from Carolynne Edouard from [facility] with questions asked by insurance rep. CSW asked to contact Nino Glow - Ref #[id number] - ph [phone]. Call made and charge nurse Harriett Sine answered questions and CSW faxed requested information to Ms. Andrez Grime.   04/11/12 - CSW will follow-up with admissions staff regarding insurance authorization.  04/11/12 - Received call from Webb at [facility] advising CSW that they have insurance authorization from [place]. Dr. Arlean Hopping contacted and CSW advised that patient will have fistula placement tomorrow and can d/c to SNF Friday or Saturday.  **Patient would have to d/c to SNF on Saturday as paperwork has not been signed by patient at his outpatient dialysis center, therefore he would have to have dialysis at the hospital on Saturday and d/c to SNF on Saturday.  04/11/12: Call made to [facility] to update her on patient status. She will check to see to they can accept patient on Saturday.  04/11/12: CSW contacted by Bjorn Loser at [facility] and they can take patient on Saturday. Bjorn Loser advised that if patient does not get fistula on Friday, they will do procedure as outpatient (Per Dr. Arlean Hopping). CSW will advise weekend CSW of discharge.   Genelle Bal, MSW, LCSW [phone]

## 2012-04-11 NOTE — Progress Notes (Signed)
Pt off unit

## 2012-04-11 NOTE — Progress Notes (Signed)
Bryan Gilmore Progress Note  Subjective:  Hasn't eaten yet today. On HD  Objective Filed Vitals:   04/11/12 0731 04/11/12 0800 04/11/12 0830 04/11/12 0900  BP: 121/78 132/80 130/76 127/87  Pulse: 75 81 77 74  Temp:      TempSrc:      Resp:      Height:      Weight:      SpO2:       Physical Exam General: NAD on HD in recliner Heart: RR with frequent ectopy Lungs: no wheezes or rales Abdomen: + ostomy Extremities: no LE edema Access; right I-J  Assessment/Plan:  1. Psych/encephalopathy - significantly improved- ok for discharge to SNF; has a spot at [facility], will be able to be discharged Sat after HD- due to administrative issues, his first outpatient HD must be during the week, i.e. Tuesday.   2. Fever/Leukoctyosis- remains afebrile off antibiotics;WBC stable at 11.8K 3. ESRD - Tolerated HD in recliner. CLIP to [place] done but never signed papers; continue TTS schedule; Next HD Thursdy; cont using RIJ PC (placed by VVS); Vein mapping done in June. VVS consulted for access placement.- discussed with VVS scheduler.  Dr. Arbie Cookey to place access Friday is possible.  4. Deconditioning - PT - using a walker with assistance. 5. Hypothyroidism- ?TSH 6.222; previous TSH 2.622 on 02/18/12 with thyroid panel on yesterday wnl; no meds 6. Hypertension/volume -controlled on Bystolic qhs with parameters to hold; no volume issues 7. Afib/Flutter- Echo w/ E.F. 60-65%, cardiology has signed off; not candidate for Warfarin anticoagulation; SCD's for DVT prophylaxis; no longer on telemetry 8. Anemia -Hgb 8.2 ; (was ^8.9 s/p transfusion 2 units on last week); on Aranesp 100 mcg/Sat and today completing IV iron bolus (x5 doses); if Hgb continues to trend down, will increase Aranesp and recheck Fe 9. Metabolic bone disease - ca 9.4, phos up to 5.4 Phoslo d/c 7/29 ; resume phoslo BID ac ;iPTH 930 6/13 and 14 7/21 - significant variance - recheck; decrease zemplar to 1 mq per week until iPTH  results back (prev Zemplar 2 tiw);Fournier's gangrene/scrotal wound- s/p I&D in June; has foley - urology replaced foley/ wound care 10. Nutrition - Alb remains 1.9 (has proteinuria), - severe malnutrition; appetite poor but intake improving; on D3 high protein renal (mech soft and thin liquids with supervision); continue encourage nepro and protein supplements tid 11. HIV - ID CD4. 180 6/8; continue current antiretroviral regimen per ID; need to reschedule hospital follow-up appt with ID clinc at discharge. 12. MRSA + contact precautions - Bactroban/Contact Isolation 13. Hep C +/Hx of EtOH abuse        15. Disposition- discharge planning - SNF appropriate at this time. DNR/DNI. Appreciate SW involvement; likely won't be able to start at outpt center until Tuesday, because staff won't be available until Tuesday for him to sign papers. (i.e. Papers can't be signed on Saturday). He should also have a family member with him there when he signs papers.      Sheffield Slider, PA-C [facility] [phone]  04/11/2012,9:24 AM  LOS: 13 days    Additional Objective Labs: Basic Metabolic Panel:  Lab 04/11/12 4540 04/09/12 0738 04/06/12 1100  NA 135 134* 133*  K 3.5 4.1 4.4  CL 95* 97 97  CO2 24 22 27   GLUCOSE 108* 102* 96  BUN 66* 52* 32*  CREATININE 5.33* 5.80* 4.72*  CALCIUM 9.3 9.29.5 9.4  ALB -- -- --  PHOS 5.4* 3.6 1.9*  Liver Function Tests:  Lab 04/11/12 0743 04/09/12 0738 04/06/12 1100 04/05/12 0545  AST -- -- -- 27  ALT -- -- -- 11  ALKPHOS -- -- -- 96  BILITOT -- -- -- 0.7  PROT -- -- -- 6.3  ALBUMIN 1.9* 2.0* 1.8* --  CBC:  Lab 04/11/12 0743 04/09/12 0738 04/06/12 1100 04/05/12 0545  WBC 11.8* 12.2* 12.2* --  NEUTROABS -- -- -- --  HGB 8.2* 8.4* 8.4* --  HCT 24.4* 25.0* 25.8* --  MCV 94.6 94.7 94.9 93.8  PLT 293 326 312 --   Blood Culture    Component Value Date/Time   SDES BLOOD RIGHT ARM 04/04/2012 2216   SPECREQUEST BOTTLES DRAWN AEROBIC  AND ANAEROBIC 10CC 04/04/2012 2216   CULT NO GROWTH 5 DAYS 04/04/2012 2216   REPTSTATUS 04/11/2012 FINAL 04/04/2012 2216  Medications:      . allopurinol  300 mg Oral Daily  . darbepoetin (ARANESP) injection - DIALYSIS  100 mcg Intravenous Q Sat-HD  . feeding supplement (NEPRO CARB STEADY)  237 mL Oral TID BM  . feeding supplement  30 mL Oral TID WC  . ferric gluconate (FERRLECIT/NULECIT) IV  125 mg Intravenous Q T,Th,Sa-HD  . lamiVUDine  50 mg Oral QHS  . lopinavir-ritonavir  5 mL Oral BID WC  . multivitamin  1 tablet Oral QHS  . mupirocin ointment   Nasal BID  . nebivolol  20 mg Oral QHS  . pantoprazole  40 mg Oral Q1200  . paricalcitol  2 mcg Intravenous 3 times weekly  . sodium chloride  3 mL Intravenous Q12H  . zidovudine  300 mg Oral QHS

## 2012-04-12 ENCOUNTER — Encounter (HOSPITAL_COMMUNITY): Payer: Self-pay | Admitting: Anesthesiology

## 2012-04-12 ENCOUNTER — Encounter (HOSPITAL_COMMUNITY)
Admission: RE | Disposition: A | Discharge status: Skilled Nursing Facility | Payer: Self-pay | Source: Ambulatory Visit | Attending: Nephrology

## 2012-04-12 ENCOUNTER — Inpatient Hospital Stay (HOSPITAL_COMMUNITY): Payer: BC Managed Care – PPO

## 2012-04-12 ENCOUNTER — Inpatient Hospital Stay (HOSPITAL_COMMUNITY): Payer: BC Managed Care – PPO | Admitting: Anesthesiology

## 2012-04-12 DIAGNOSIS — N186 End stage renal disease: Secondary | ICD-10-CM

## 2012-04-12 HISTORY — PX: AV FISTULA PLACEMENT: SHX1204

## 2012-04-12 LAB — URINALYSIS, ROUTINE W REFLEX MICROSCOPIC
Bilirubin Urine: NEGATIVE
Ketones, ur: NEGATIVE mg/dL
Nitrite: NEGATIVE
Protein, ur: >300 mg/dL — AB
Urobilinogen, UA: 0.2 mg/dL (ref 0.0–1.0)

## 2012-04-12 LAB — CBC
MCH: 32.1 pg (ref 26.0–34.0)
MCV: 98.2 fL (ref 78.0–100.0)
Platelets: 289 10*3/uL (ref 150–400)
RDW: 20.7 % — ABNORMAL HIGH (ref 11.5–15.5)

## 2012-04-12 LAB — URINE MICROSCOPIC-ADD ON

## 2012-04-12 LAB — BASIC METABOLIC PANEL
CO2: 26 mEq/L (ref 19–32)
Calcium: 9.4 mg/dL (ref 8.4–10.5)
Creatinine, Ser: 3.57 mg/dL — ABNORMAL HIGH (ref 0.50–1.35)
Glucose, Bld: 102 mg/dL — ABNORMAL HIGH (ref 70–99)

## 2012-04-12 LAB — PTH, INTACT AND CALCIUM: Calcium, Total (PTH): 8.7 mg/dL (ref 8.4–10.5)

## 2012-04-12 SURGERY — INSERTION OF ARTERIOVENOUS (AV) GORE-TEX GRAFT ARM
Anesthesia: Monitor Anesthesia Care | Site: Arm Lower | Laterality: Left | Wound class: Clean

## 2012-04-12 MED ORDER — ONDANSETRON HCL 4 MG/2ML IJ SOLN: 4.0000 mg | Freq: Once | INTRAMUSCULAR | Status: DC | PRN

## 2012-04-12 MED ORDER — 0.9 % SODIUM CHLORIDE (POUR BTL) OPTIME
TOPICAL | Status: DC | PRN
  Administered 2012-04-12: 1000 mL

## 2012-04-12 MED ORDER — SODIUM CHLORIDE 0.9 % IR SOLN
Status: DC | PRN
  Administered 2012-04-12: 1000 mL

## 2012-04-12 MED ORDER — SODIUM CHLORIDE 0.9 % IV SOLN
INTRAVENOUS | Status: DC
  Administered 2012-04-12: 13:00:00 via INTRAVENOUS

## 2012-04-12 MED ORDER — HYDROMORPHONE HCL PF 1 MG/ML IJ SOLN: 0.2500 mg | INTRAMUSCULAR | Status: DC | PRN

## 2012-04-12 MED ORDER — HEPARIN SODIUM (PORCINE) 1000 UNIT/ML DIALYSIS
20.0000 [IU]/kg | INTRAMUSCULAR | Status: DC | PRN
  Filled 2012-04-12: qty 2

## 2012-04-12 MED ORDER — PROPOFOL 10 MG/ML IV EMUL
INTRAVENOUS | Status: DC | PRN
  Administered 2012-04-12: 50 ug/kg/min via INTRAVENOUS

## 2012-04-12 MED ORDER — LIDOCAINE-EPINEPHRINE 0.5 %-1:200000 IJ SOLN
INTRAMUSCULAR | Status: DC | PRN
  Administered 2012-04-12: 11 mL

## 2012-04-12 MED ORDER — HEPARIN SODIUM (PORCINE) 1000 UNIT/ML IJ SOLN
INTRAMUSCULAR | Status: DC | PRN
  Administered 2012-04-12: 6000 [IU]

## 2012-04-12 SURGICAL SUPPLY — 42 items
APL SKNCLS STERI-STRIP NONHPOA (GAUZE/BANDAGES/DRESSINGS) ×2
BENZOIN TINCTURE PRP APPL 2/3 (GAUZE/BANDAGES/DRESSINGS) ×3 IMPLANT
CANISTER SUCTION 2500CC (MISCELLANEOUS) ×3 IMPLANT
CLIP LIGATING EXTRA MED SLVR (CLIP) ×3 IMPLANT
CLIP LIGATING EXTRA SM BLUE (MISCELLANEOUS) ×3 IMPLANT
CLOTH BEACON ORANGE TIMEOUT ST (SAFETY) ×3 IMPLANT
CLSR STERI-STRIP ANTIMIC 1/2X4 (GAUZE/BANDAGES/DRESSINGS) ×2 IMPLANT
COVER PROBE W GEL 5X96 (DRAPES) ×3 IMPLANT
COVER SURGICAL LIGHT HANDLE (MISCELLANEOUS) ×3 IMPLANT
DECANTER SPIKE VIAL GLASS SM (MISCELLANEOUS) ×1 IMPLANT
ELECT REM PT RETURN 9FT ADLT (ELECTROSURGICAL) ×3
ELECTRODE REM PT RTRN 9FT ADLT (ELECTROSURGICAL) ×2 IMPLANT
GEL ULTRASOUND 20GR AQUASONIC (MISCELLANEOUS) IMPLANT
GLOVE BIOGEL PI IND STRL 6.5 (GLOVE) ×2 IMPLANT
GLOVE BIOGEL PI IND STRL 7.0 (GLOVE) ×1 IMPLANT
GLOVE BIOGEL PI IND STRL 7.5 (GLOVE) ×1 IMPLANT
GLOVE BIOGEL PI INDICATOR 6.5 (GLOVE) ×2
GLOVE BIOGEL PI INDICATOR 7.0 (GLOVE) ×1
GLOVE BIOGEL PI INDICATOR 7.5 (GLOVE) ×1
GLOVE ECLIPSE 6.5 STRL STRAW (GLOVE) ×2 IMPLANT
GLOVE SS BIOGEL STRL SZ 7 (GLOVE) ×1 IMPLANT
GLOVE SS BIOGEL STRL SZ 7.5 (GLOVE) ×2 IMPLANT
GLOVE SUPERSENSE BIOGEL SZ 7 (GLOVE) ×1
GLOVE SUPERSENSE BIOGEL SZ 7.5 (GLOVE) ×1
GLOVE SURG SS PI 7.5 STRL IVOR (GLOVE) ×4 IMPLANT
GOWN STRL NON-REIN LRG LVL3 (GOWN DISPOSABLE) ×12 IMPLANT
GRAFT GORETEX STRT 6X50 (Vascular Products) ×2 IMPLANT
KIT BASIN OR (CUSTOM PROCEDURE TRAY) ×3 IMPLANT
KIT ROOM TURNOVER OR (KITS) ×3 IMPLANT
NS IRRIG 1000ML POUR BTL (IV SOLUTION) ×3 IMPLANT
PACK CV ACCESS (CUSTOM PROCEDURE TRAY) ×3 IMPLANT
PAD ARMBOARD 7.5X6 YLW CONV (MISCELLANEOUS) ×6 IMPLANT
SPONGE GAUZE 4X4 12PLY (GAUZE/BANDAGES/DRESSINGS) ×3 IMPLANT
STRIP CLOSURE SKIN 1/2X4 (GAUZE/BANDAGES/DRESSINGS) ×3 IMPLANT
SUT PROLENE 6 0 CC (SUTURE) ×5 IMPLANT
SUT VIC AB 3-0 SH 27 (SUTURE) ×6
SUT VIC AB 3-0 SH 27X BRD (SUTURE) ×3 IMPLANT
TAPE CLOTH SURG 4X10 WHT LF (GAUZE/BANDAGES/DRESSINGS) ×2 IMPLANT
TOWEL OR 17X24 6PK STRL BLUE (TOWEL DISPOSABLE) ×3 IMPLANT
TOWEL OR 17X26 10 PK STRL BLUE (TOWEL DISPOSABLE) ×3 IMPLANT
UNDERPAD 30X30 INCONTINENT (UNDERPADS AND DIAPERS) ×3 IMPLANT
WATER STERILE IRR 1000ML POUR (IV SOLUTION) ×3 IMPLANT

## 2012-04-12 NOTE — Interval H&P Note (Signed)
History and Physical Interval Note:  04/12/2012 1:28 PM  Bryan Gilmore  has presented today for surgery, with the diagnosis of ESRD  The various methods of treatment have been discussed with the patient and family. After consideration of risks, benefits and other options for treatment, the patient has consented to  Procedure(s) (LRB): BASCILIC VEIN TRANSPOSITION (Left) as a surgical intervention .  The patient's history has been reviewed, patient examined, no change in status, stable for surgery.  I have reviewed the patient's chart and labs.  Questions were answered to the patient's satisfaction.     EARLY, TODD

## 2012-04-12 NOTE — Progress Notes (Signed)
04/12/12 Dr Early aware of patient's temp last night of 100.6, patient at this time is afebrile.  Continue to go to surgery per Dr Arbie Cookey.  Mervin Hack rn

## 2012-04-12 NOTE — Progress Notes (Signed)
Report received from [place]

## 2012-04-12 NOTE — Progress Notes (Signed)
Pt restless pulled off colostomy had to be cleaned up reapplied new collostomy bag

## 2012-04-12 NOTE — Op Note (Signed)
OPERATIVE REPORT  DATE OF SURGERY: 04/12/2012  PATIENT: Bryan Gilmore, 61 y.o. male MRN: [id number]  DOB: [birth year]  PRE-OPERATIVE DIAGNOSIS: End-stage renal disease  POST-OPERATIVE DIAGNOSIS:  Same  PROCEDURE: Left forearm loop AV Gore-Tex graft  SURGEON:  Gretta Began, M.D.  PHYSICIAN ASSISTANT: Collins  ANESTHESIA:  Local with sedation  EBL: Minimal ml     BLOOD ADMINISTERED: None  DRAINS: None  SPECIMEN: None  COUNTS CORRECT:  YES  PLAN OF CARE: PACU   PATIENT DISPOSITION:  PACU - hemodynamically stable  PROCEDURE DETAILS: The patient was taken to the operating room placed supine position where the area of the left arm was prepped and draped in usual sterile fashion. An incision was made over the antecubital space carried down to isolate the brachial artery and brachial vein. Ultrasound imaging revealed that the patient had very small cephalic vein and unusable basilic vein for fistula. Separate incision made over the distal forearm loop configuration tunnel was created. A 6 mm standard wall stretch Gore-Tex graft was brought through the tunnel. The brachial vein was occluded proximally and distally was opened 11 blade and symmetry Potts scissors. The vein was spatulated and sewn into side to the vein with a running 6-0 Prolene suture. Clamps removed the graft was flushed with heparinized and reoccluded. Next the brachial artery was occluded proximally and distally and a small arteriotomy was created. The graft cut to appropriate length and was sewn end-to-side to the artery with a running 6-0 Prolene suture. Clamps removed and excellent thrill was noted. Wound irrigated with saline. Hemostasis obtained with cautery. Wounds were closed with 3-0 Vicryl in the subcutaneous and subcuticular tissue   Gretta Began, M.D. 04/12/2012 3:19 PM

## 2012-04-12 NOTE — Progress Notes (Signed)
Colostomy site cleabned new bag applied dressing of sterile 4x4's applied to small open area on middle of abdomen

## 2012-04-12 NOTE — Consult Note (Signed)
WOC consult Note Reason for Consult: CCS performed initial consult for abd and scrotal wounds on 7/22 and ordered plan of care.  WOC requested for abd and scrotal wounds at this time for s/s infection.  Pt is in OR and unable to assess until Mon.  If assessment desired sooner than that date, please reconsult CCS team.  Cammie Mcgee, RN, MSN, [facility]  [phone]

## 2012-04-12 NOTE — Transfer of Care (Signed)
Immediate Anesthesia Transfer of Care Note  Patient: Bryan Gilmore  Procedure(s) Performed: Procedure(s) (LRB): INSERTION OF ARTERIOVENOUS (AV) GORE-TEX GRAFT ARM (Left)  Patient Location: PACU  Anesthesia Type: MAC  Level of Consciousness: awake  Airway & Oxygen Therapy: Patient Spontanous Breathing  Post-op Assessment: Report given to PACU RN and Post -op Vital signs reviewed and stable  Post vital signs: Reviewed and stable  Complications: No apparent anesthesia complications

## 2012-04-12 NOTE — Anesthesia Preprocedure Evaluation (Addendum)
Anesthesia Evaluation  Patient identified by MRN, date of birth, ID band Patient awake and Patient confused    Reviewed: Allergy & Precautions, H&P , NPO status , Patient's Chart, lab work & pertinent test results  Airway Mallampati: I TM Distance: >3 FB Neck ROM: full    Dental   Pulmonary former smoker,          Cardiovascular Exercise Tolerance: Poor hypertension, + dysrhythmias Atrial Fibrillation Rhythm:irregular Rate:Normal     Neuro/Psych PSYCHIATRIC DISORDERS    GI/Hepatic (+) Hepatitis -  Endo/Other    Renal/GU CRF, ESRF and DialysisRenal disease     Musculoskeletal   Abdominal   Peds  Hematology  (+) HIV,   Anesthesia Other Findings   Reproductive/Obstetrics                          Anesthesia Physical Anesthesia Plan  ASA: IV  Anesthesia Plan: MAC   Post-op Pain Management:    Induction: Intravenous  Airway Management Planned: Mask and LMA  Additional Equipment:   Intra-op Plan:   Post-operative Plan:   Informed Consent:   Plan Discussed with: CRNA, Anesthesiologist and Surgeon  Anesthesia Plan Comments:         Anesthesia Quick Evaluation

## 2012-04-12 NOTE — H&P (View-Only) (Signed)
VVS  Pt vein mapping from June reviewed by Dr. Lawson - possible left BVT versus graft.  We will try to schedule tomorrow but may have to be on Monday.  

## 2012-04-12 NOTE — Progress Notes (Signed)
Pt received from PACU. Pt stable. Pt alert and oriented to room. Call bell within reach. Bryan Gilmore, Bryan Gilmore

## 2012-04-12 NOTE — Progress Notes (Signed)
Subjective:  Tmax 100.6, no chills or subjective fever. Eating, getting OOB. No complaints.   Objective Filed Vitals:   04/11/12 1651 04/11/12 2048 04/12/12 0607 04/12/12 0959  BP: 99/70 114/64 108/68 130/1  Pulse: 77 73 71 81  Temp: 100.2 F (37.9 C) 100.6 F (38.1 C) 98.6 F (37 C) 98.4 F (36.9 C)  TempSrc: Oral Oral Oral Oral  Resp: 18 17 17 18   Height:      Weight:  88.7 kg (195 lb 8.8 oz)    SpO2: 100% 99% 99% 100%   Physical Exam General: NAD in bed, alert Heart: RR with frequent ectopy Lungs: no wheezes or rales, R IJ TDC no drainage at exit site Abdomen: + ostomy LLQ, midline wound weal healed except open area 1x2 cm, no fluctuance or drainage noted in the wound area.  GU: scrotal wound is healing, there is exposed scrotal tissue with no odor or drainage Extremities: no LE edema Access; right I-J  Assessment/Plan:  1. Fever- concerning, is low grade but getting worse. Suspect UTI, wounds don't look infected on exam, HD cath site is clean.  Will panculture, check UA and CXR. Ask Wound Care nurse to check various wounds. WBC down.  2. AMS- do to being overmedicated on psych meds. Resolved, all meds d/c'd.  3. ESRD - TTS clipped at [facility]; awaiting word from [facility]VS regarding timing of AVF placement. Tight hep x 2 prn w HD.  4. Deconditioning - PT - using a walker with assistance. 5. Elevated TSH- at 6.222, TFT's were all normal c/w sick euthyroid, do not retest til acute illness resolved.  6. Hypertension/volume -controlled on Bystolic qhs with parameters to hold; no volume issues 7. Afib/Flutter- Echo w/ E.F. 60-65%, cardiology has signed off; not candidate for Warfarin anticoagulation; SCD's for DVT prophylaxis; no longer on telemetry 8. Anemia -Hgb 8.2 ; (was ^8.9 s/p transfusion 2 units on last week); on Aranesp 100 mcg/Sat and today completing IV iron bolus (x5 doses); if Hgb continues to trend down, will increase Aranesp and recheck Fe 9. Metabolic bone disease - ca 9.4,  phos up to 5.4 Phoslo d/c 7/29 ; resume phoslo BID ac ;iPTH 930 6/13 and 14 7/21 - significant variance - recheck; decrease zemplar to 1 mq per week until iPTH results back (prev Zemplar 2 tiw); 10. Fournier's gangrene/scrotal wound- s/p I&D in June; has foley - urology replaced foley/ wound care 11. Nutrition - Alb remains 1.9 (has proteinuria), - severe malnutrition; appetite poor but intake improving; on D3 high protein renal (mech soft and thin liquids with supervision); continue encourage nepro and protein supplements tid 12. HIV - ID CD4. 180 6/8; continue current antiretroviral regimen per ID; need to reschedule hospital follow-up appt with ID clinc at discharge. 13. MRSA + contact precautions - Bactroban/Contact Isolation 14. Hep C +/Hx of EtOH abuse        15. Disposition- SNF when medically stable   Vinson Moselle  MD [facility] [phone] pgr    [phone] cell 04/12/2012, 10:26 AM    Additional Objective Labs: Basic Metabolic Panel:  Lab 04/12/12 4782 04/11/12 0743 04/09/12 0738 04/06/12 1100  NA 139 135 134* --  K 3.9 3.5 4.1 --  CL 100 95* 97 --  CO2 26 24 22  --  GLUCOSE 102* 108* 102* --  BUN 40* 66* 52* --  CREATININE 3.57* 5.33* 5.80* --  CALCIUM 9.4 9.3 9.29.5 --  ALB -- -- -- --  PHOS -- 5.4* 3.6 1.9*  Liver Function Tests:  Lab 04/11/12 0743 04/09/12 0738 04/06/12 1100  AST -- -- --  ALT -- -- --  ALKPHOS -- -- --  BILITOT -- -- --  PROT -- -- --  ALBUMIN 1.9* 2.0* 1.8*  CBC:  Lab 04/12/12 0520 04/11/12 0743 04/09/12 0738 04/06/12 1100  WBC 8.9 11.8* 12.2* --  NEUTROABS -- -- -- --  HGB 8.7* 8.2* 8.4* --  HCT 26.6* 24.4* 25.0* --  MCV 98.2 94.6 94.7 94.9  PLT 289 293 326 --   Blood Culture    Component Value Date/Time   SDES BLOOD RIGHT ARM 04/04/2012 2216   SPECREQUEST BOTTLES DRAWN AEROBIC AND ANAEROBIC 10CC 04/04/2012 2216   CULT NO GROWTH 5 DAYS 04/04/2012 2216   REPTSTATUS 04/11/2012 FINAL 04/04/2012 2216  Medications:        . allopurinol  300 mg Oral Daily  . calcium acetate  667 mg Oral BID AC  . cefUROXime (ZINACEF)  IV  1.5 g Intravenous On Call to OR  . darbepoetin (ARANESP) injection - DIALYSIS  100 mcg Intravenous Q Sat-HD  . feeding supplement (NEPRO CARB STEADY)  237 mL Oral TID BM  . feeding supplement  30 mL Oral TID WC  . ferric gluconate (FERRLECIT/NULECIT) IV  125 mg Intravenous Q T,Th,Sa-HD  . lamiVUDine  50 mg Oral QHS  . lopinavir-ritonavir  5 mL Oral BID WC  . multivitamin  1 tablet Oral QHS  . mupirocin ointment   Nasal BID  . nebivolol  20 mg Oral QHS  . pantoprazole  40 mg Oral Q1200  . paricalcitol  2 mcg Intravenous 3 times weekly  . sodium chloride  3 mL Intravenous Q12H  . zidovudine  300 mg Oral QHS

## 2012-04-12 NOTE — Clinical Social Work Note (Signed)
CSW spoke with patient's nephew on 7/31 at patient's room and he is pleased that patient's mental status is better and that he continues to improve physically. Discharge to [facility] is still the family's plan.  CSW talked with MD to have FL-2 and out-of-facility DNR signed and to get update on patient readiness for discharge. Per Dr. Arlean Hopping, patient has a fever, so he is not sure if patient will d/c on Saturday. CSW will contact [facility] and advise that patient may not discharge on Saturday. CSW will continue to monitor patient progress and will leave handoff for weekend staff to follow-up to determine if patient ready for d/c.   Genelle Bal, MSW, LCSW [phone]

## 2012-04-12 NOTE — Anesthesia Postprocedure Evaluation (Signed)
  Anesthesia Post-op Note  Patient: Bryan Gilmore  Procedure(s) Performed: Procedure(s) (LRB): INSERTION OF ARTERIOVENOUS (AV) GORE-TEX GRAFT ARM (Left)  Patient Location: PACU  Anesthesia Type: MAC  Level of Consciousness: awake, patient cooperative and confused  Airway and Oxygen Therapy: Patient Spontanous Breathing and Patient connected to nasal cannula oxygen  Post-op Pain: none  Post-op Assessment: Post-op Vital signs reviewed, Patient's Cardiovascular Status Stable, Respiratory Function Stable, Patent Airway, No signs of Nausea or vomiting and Pain level controlled  Post-op Vital Signs: stable  Complications: No apparent anesthesia complications

## 2012-04-12 NOTE — Progress Notes (Signed)
Physical Therapy Treatment Patient Details Name: Bryan Gilmore MRN: [id number] DOB: 12-09-1950 Today's Date: 04/12/2012 Time: 0725-0734 PT Time Calculation (min): 9 min  PT Assessment / Plan / Recommendation Comments on Treatment Session  Continuing to progress with mobility.  Made a few comments that were not approrriate to the situation but overall much more engaged in session from cognitive standpoint.    Follow Up Recommendations  Skilled nursing facility;Supervision/Assistance - 24 hour;Supervision for mobility/OOB    Barriers to Discharge        Equipment Recommendations  Defer to next venue    Recommendations for Other Services    Frequency Min 3X/week   Plan Discharge plan remains appropriate;Frequency remains appropriate    Precautions / Restrictions Precautions Precautions: Fall Precaution Comments: Scrotal wound, colostomy Restrictions Weight Bearing Restrictions: No   Pertinent Vitals/Pain No pain reported, no distress    Mobility  Bed Mobility Bed Mobility: Supine to Sit;Sitting - Scoot to Edge of Bed Rolling Right: 5: Supervision;With rail Sitting - Scoot to Edge of Bed: 5: Supervision Details for Bed Mobility Assistance: cues for safety and technique Transfers Transfers: Sit to Stand;Stand to Sit Sit to Stand: 4: Min assist;From bed;With upper extremity assist;From elevated surface Stand to Sit: 4: Min assist;With upper extremity assist;To chair/3-in-1 Details for Transfer Assistance: Pt. needed to have height of bed elevated this am due to LE weakness, needed min assist of r controlled descent Ambulation/Gait Ambulation/Gait Assistance: 4: Min assist Ambulation Distance (Feet): 65 Feet Assistive device: Rolling walker Ambulation/Gait Assistance Details: Pt. somewhat unsteady this am, possible due to just waking up.  He needs cues for sfe RW distance. Gait Pattern: Step-to pattern;Decreased stride length;Right foot flat;Left foot flat;Right flexed knee in  stance;Left flexed knee in stance;Shuffle;Trunk flexed    Exercises     PT Diagnosis:    PT Problem List:   PT Treatment Interventions:     PT Goals Acute Rehab PT Goals PT Goal: Supine/Side to Sit - Progress: Progressing toward goal PT Goal: Sit to Stand - Progress: Progressing toward goal PT Goal: Stand to Sit - Progress: Progressing toward goal PT Goal: Ambulate - Progress: Progressing toward goal  Visit Information  Last PT Received On: 04/12/12 Assistance Needed: +1    Subjective Data  Subjective: Agreeable to OOB   Cognition  Arousal/Alertness: Awake/alert Orientation Level: Appears intact for tasks assessed Behavior During Session: [facility] for tasks performed    Balance     End of Session PT - End of Session Equipment Utilized During Treatment: Gait belt Activity Tolerance: Patient tolerated treatment well;Patient limited by fatigue Patient left: in chair;with call bell/phone within reach Nurse Communication: Mobility status   GP     Ferman Hamming 04/12/2012, 11:15 AM Weldon Picking PT Acute Rehab Services [phone] Beeper [phone]

## 2012-04-13 ENCOUNTER — Inpatient Hospital Stay (HOSPITAL_COMMUNITY): Payer: BC Managed Care – PPO

## 2012-04-13 DIAGNOSIS — B20 Human immunodeficiency virus [HIV] disease: Secondary | ICD-10-CM | POA: Diagnosis not present

## 2012-04-13 DIAGNOSIS — N189 Chronic kidney disease, unspecified: Secondary | ICD-10-CM | POA: Diagnosis not present

## 2012-04-13 LAB — CBC
MCH: 31.3 pg (ref 26.0–34.0)
MCV: 96.3 fL (ref 78.0–100.0)
Platelets: 256 10*3/uL (ref 150–400)
RDW: 19.7 % — ABNORMAL HIGH (ref 11.5–15.5)

## 2012-04-13 LAB — RENAL FUNCTION PANEL
Albumin: 2 g/dL — ABNORMAL LOW (ref 3.5–5.2)
Calcium: 9.7 mg/dL (ref 8.4–10.5)
Creatinine, Ser: 5.07 mg/dL — ABNORMAL HIGH (ref 0.50–1.35)
GFR calc non Af Amer: 11 mL/min — ABNORMAL LOW (ref >90)

## 2012-04-13 MED ORDER — CALCIUM ACETATE 667 MG PO CAPS: 667.0000 mg | ORAL_CAPSULE | Freq: Three times a day (TID) | ORAL | Status: DC

## 2012-04-13 MED ORDER — NEPRO/CARBSTEADY PO LIQD: 237.0000 mL | Freq: Three times a day (TID) | ORAL | Status: DC

## 2012-04-13 MED ORDER — PARICALCITOL 5 MCG/ML IV SOLN: 2.0000 ug | INTRAVENOUS | Status: DC

## 2012-04-13 MED ORDER — ACETAMINOPHEN 325 MG PO TABS
ORAL_TABLET | ORAL | Status: AC
  Administered 2012-04-13: 650 mg via ORAL
  Filled 2012-04-13: qty 2

## 2012-04-13 MED ORDER — RENA-VITE PO TABS: 1.0000 | ORAL_TABLET | Freq: Every day | ORAL | Status: DC

## 2012-04-13 MED ORDER — PARICALCITOL 5 MCG/ML IV SOLN
INTRAVENOUS | Status: AC
  Filled 2012-04-13: qty 1

## 2012-04-13 MED ORDER — MUPIROCIN 2 % EX OINT: TOPICAL_OINTMENT | Freq: Two times a day (BID) | CUTANEOUS | Status: AC

## 2012-04-13 MED ORDER — DARBEPOETIN ALFA-POLYSORBATE 100 MCG/0.5ML IJ SOLN
INTRAMUSCULAR | Status: AC
  Filled 2012-04-13: qty 0.5

## 2012-04-13 MED ORDER — PRO-STAT SUGAR FREE PO LIQD: 30.0000 mL | Freq: Three times a day (TID) | ORAL | Status: DC

## 2012-04-13 NOTE — Progress Notes (Signed)
Subjective:  On hd, sp yesterday Left fa avgg insert minimal post op pain , mild scrotal pain, more alert today Objective Vital signs in last 24 hours: Filed Vitals:   04/13/12 0718 04/13/12 0740 04/13/12 0800 04/13/12 0830  BP: 110/49 129/74 119/63 115/62  Pulse: 75 83 87 88  Temp:      TempSrc:      Resp:      Height:      Weight:      SpO2:       Weight change: 2.45 kg (5 lb 6.4 oz)  Intake/Output Summary (Last 24 hours) at 04/13/12 0857 Last data filed at 04/12/12 1615  Gross per 24 hour  Intake    100 ml  Output    180 ml  Net    -80 ml   Labs: Basic Metabolic Panel:  Lab 04/13/12 1610 04/12/12 0520 04/11/12 0743 04/09/12 0738  NA 136 139 135 --  K 4.2 3.9 3.5 --  CL 97 100 95* --  CO2 25 26 24  --  GLUCOSE 110* 102* 108* --  BUN 62* 40* 66* --  CREATININE 5.07* 3.57* 5.33* --  CALCIUM 9.7 9.4 8.79.3 --  ALB -- -- -- --  PHOS 5.9* -- 5.4* 3.6   Liver Function Tests:  Lab 04/13/12 0740 04/11/12 0743 04/09/12 0738  AST -- -- --  ALT -- -- --  ALKPHOS -- -- --  BILITOT -- -- --  PROT -- -- --  ALBUMIN 2.0* 1.9* 2.0*   No results found for this basename: LIPASE:3,AMYLASE:3 in the last 168 hours No results found for this basename: AMMONIA:3 in the last 168 hours CBC:  Lab 04/13/12 0740 04/12/12 0520 04/11/12 0743 04/09/12 0738 04/06/12 1100  WBC 10.0 8.9 11.8* -- --  NEUTROABS -- -- -- -- --  HGB 8.4* 8.7* 8.2* -- --  HCT 25.8* 26.6* 24.4* -- --  MCV 96.3 98.2 94.6 94.7 94.9  PLT 256 289 293 -- --   Cardiac Enzymes: No results found for this basename: CKTOTAL:5,CKMB:5,CKMBINDEX:5,TROPONINI:5 in the last 168 hours CBG: No results found for this basename: GLUCAP:5 in the last 168 hours  Iron Studies: No results found for this basename: IRON,TIBC,TRANSFERRIN,FERRITIN in the last 72 hours Studies/Results: Dg Chest 2 View  04/12/2012  *RADIOLOGY REPORT*  Clinical Data: Fever and shortness of breath  CHEST - 2 VIEW  Comparison: 03/29/2012  Findings: A dual  lumen dialysis catheter is in place, stable in position.  Heart and mediastinal contours are stable with calcification seen in the aortic arch and mild cardiomegaly noted.  The lung fields appear clear with no signs of focal infiltrate or congestive failure.  No pleural fluid or significant peribronchial cuffing is identified.  Bony structures appear intact.  IMPRESSION: Stable appearance with no radiographic evidence for pneumonia seen.  Original Report Authenticated By: Bertha Stakes, M.D.   Medications:    . sodium chloride 10 mL/hr at 04/12/12 1256      . allopurinol  300 mg Oral Daily  . calcium acetate  667 mg Oral BID AC  . cefUROXime (ZINACEF)  IV  1.5 g Intravenous On Call to OR  . darbepoetin      . darbepoetin (ARANESP) injection - DIALYSIS  100 mcg Intravenous Q Sat-HD  . feeding supplement (NEPRO CARB STEADY)  237 mL Oral TID BM  . feeding supplement  30 mL Oral TID WC  . ferric gluconate (FERRLECIT/NULECIT) IV  125 mg Intravenous Q T,Th,Sa-HD  . lamiVUDine  50 mg Oral QHS  . lopinavir-ritonavir  5 mL Oral BID WC  . multivitamin  1 tablet Oral QHS  . mupirocin ointment   Nasal BID  . nebivolol  20 mg Oral QHS  . pantoprazole  40 mg Oral Q1200  . paricalcitol      . paricalcitol  2 mcg Intravenous 3 times weekly  . sodium chloride  3 mL Intravenous Q12H  . zidovudine  300 mg Oral QHS   I  have reviewed scheduled and prn medications.  Physical Exam: General: Alert Bm on  HD NAD  Heart: RR with ocaasional ectopy , rate  70s Lungs: CTA no wheezes or rales Abdomen: + ostomy LLQ with brown stool , midline wound weal healed except open area 1x2 cm, no fluctuance or drainage noted in the wound area.  GU: scrotal wound is healing, there is exposed scrotal tissue with pink healing tissue no odor or drainage  Extremities: no LE edema  Access;  R IJ TDC no drainage at exit site / left forearm avgg positive bruit,slightly cool hand to touch  With good grip     Problem/Plan: 1. Fever- concerning, is low grade,  blood cultures no growth so far. Suspect UTI, with urine in foley now clearing by exam  Yesterday more brown  Bloody urine / wounds don't look infected on exam, HD cath site is clean.  CXR no pna, Asking  Wound Care nurse to check various wounds. WBC down.  2. AMS-m this am OX3, etiology appears to be being overmedicated on psych meds. Resolved, all meds d/c'd.  3. ESRD - TTS clipped at [facility]; now has  avgg placement yesterday. Tight hep x 2 prn w HD.  4. Deconditioning - PT - using a walker with assistance. 5. Elevated TSH- at 6.222, TFT's were all normal c/w sick euthyroid, do not retest til acute illness resolved.  6. Hypertension/volume -controlled on Bystolic20mg  qhs with parameters to hold; no volume issues 7. Afib/Flutter- Echo w/ E.F. 60-65%, cardiology has signed off; not candidate for Warfarin anticoagulation; SCD's for DVT prophylaxis; no longer on telemetry 8. Anemia -Hgb 8.4 (was ^8.9 s/p transfusion 2 units on last week); on Aranesp 100 mcg/Sat and today completing IV iron bolus (x5 doses); if Hgb continues to trend down, will increase Aranesp and recheck Fe 9. Metabolic bone disease - ca 9.4, phos up to 5.4 Phoslo d/c 7/29 ; resume phoslo BID ac ;iPTH 930 6/13 and 14 7/21 - significant variance - recheck = 60 will dc zemplar  , fu next month 10. Fournier's gangrene/scrotal wound- s/p I&D in June; has foley - urology replaced foley/ wound care 11. Nutrition - Alb remains 1.9 (has proteinuria), - severe malnutrition; appetite poor but intake improving; on D3 high protein renal (mech soft and thin liquids with supervision); continue encourage nepro and protein supplements tid 12. HIV - ID CD4. 180 6/8; continue current antiretroviral regimen per ID; need to reschedule hospital follow-up appt with ID clinc at discharge. 13. MRSA + contact precautions - Bactroban/Contact Isolation 14. Hep C +/Hx of EtOH abuse Disposition- SNF when  medically stable     Lenny Pastel, PA-C [facility] [phone] 04/13/2012,8:57 AM  LOS: 15 days

## 2012-04-13 NOTE — Discharge Summary (Signed)
Physician Discharge Summary  Patient ID: Bryan Gilmore MRN: [id number] DOB/AGE: 1951-04-20 61 y.o.  Admit date: 03/29/2012 Discharge date: 04/13/2012  Consults: VVS,= Dr. Arbie Cookey  ID+ Dr. Marissa Calamity RN, Urology= Dr. Caryl Pina Cardiology= Dr. Peter Swaziland   Bryan Gilmore is a 61 y.o. male with ESRD (started during June 2013 admission )on HD who was discharged from [facility] (6/21 - 03/26/2012 admission) to [facility] where he was treated following sepsis secondary to Fournier's gangrene s/p debridement. During the [facility] admission, he also had a Hartman procedure with end colostomy and umbilical hernia repair due to soilage of wound with incontinence and diarrhea. Also during that admission he was seen by psychiatry, the details of which are not available in discharge summary except that he is on remeron, seroquel, klonopin and prn haldol and ativan. When he presented to the [facility] dialysis center yesterday, he was confused and argumentative and unable to sign consents. At some point, he pulled his dialysis catheter out and arrangements were made for replacement today. Initial istat was 6.8 with serum confirmation of the same. His tunneled catheter placement was post-poned. He was admitted for emergent hemodialysis and correction of hypokalemia and placement of tunneled catheter.  His discharge summary form [facility] states he is a DNR/DNI   Present on Admission:  .Hyperkalemia .Secondary hyperparathyroidism (of renal origin) .ESRD (end stage renal disease) .HIV INFECTION .HEPATITIS C .Fever .Delirium .Atrial fibrillation, permanent .Metabolic encephalopathy scrotal/perineal/peri-rectal necrotizing fascitis  Urethral stricture - continue foley  Urethro-cutaneous fistula  Discharge Diagnoses  .Hyperkalemia .Secondary hyperparathyroidism (of renal origin) .ESRD (end stage renal disease) .HIV INFECTION .HEPATITIS C .Fever .Delirium .Atrial fibrillation, permanent .Metabolic  encephalopathy  Course in Hospital  1. AMS-  CT head showed ischemic small vessel changes and atrophy . Patient was somnolent and overmedication was felt to be  The issue. He had never had any psych diagnosis prior to his LT[facility] stay which is where all the meds were started. Discussions w family were held and all psych meds (Klonopin, Remeron and Seroquel) were stopped.  About a week later after stopping meds patient became fully oriented and alert.  2. ESRD - TTS clipped at [facility]; He had aLeft forearm avgg placed by Dr. Early8/02013 without steal and follow up in his ov as planned. Will use. Tight hep x 2 prn w HD.. Initially needed acute hd for  k of 6.8 and fem. Hd cath as he pulled his perm cath out at admitt with ams. A new perm cath was placed by VVS 3. Deconditioning - PT noted that he needed continued PT using a walker with assistance.Secondary to scrotal wounds.  4. Hypertension/volume -controlled on Bystolic20mg  qhs with parameters to hold; no volume issues 5. Afib/Flutter- Echo w/ E.F. 60-65%, cardiology evaluated and signed off;noting he was not  a candidate for Warfarin as per Dr. Swaziland. anticoagulation; SCD's for DVT prophylaxis used. 6. Anemia -Hgb 8.4 (was ^8.9 s/p transfusion 2 units on last week); on Aranesp 100 mcg/Sat and  Will be on epogen 20000 at kidney center.Need to follow up iron studies in kidney center, with Ferritn 1434 and 8 % sat. Rate with fever no iron given . 7. Metabolic bone disease - ca 9.4, phos up to 5.4. On binder Phoslo ;   BID ac ;iPTH 930 6/13 and 14 7/21 - significant variance - recheck = 60  And no zemplar , fu next month 8. Fournier's gangrene/scrotal wound=He underwent cystoscopy, baloon dilation of urethral stricture, foley placement and debridement of scrotal/perineal/peri-rectal necrotizing  fascitis February 17, 2012 by Dr. Sherron Monday. He was taken back to OR February 29, 2012 for partial wound closure. Seen by Urology in hospital with granulation tissue present at  discharge . He will need close follow up after dc by urology. No antibiotics needed as noted by ID 9. HIV/ Fevers = He spiked some low grade temps in hospital and was pancultured with no growth at time of  Discharge. Dr. Orvan Falconer was consulted and stopped antibiotics in hospital. He noted recent CD4 count done 04/01/12 was720 showing that his immune system is relatively intact. I did not think his recent fever was HIV related / unclear what is the source.Pt is to have follow up in ID clinic outpt 10. Nutrition - Alb remains 1.9 (has proteinuria), - severe malnutrition; appetite poor but intake improving; on D3 high protein renal (mech soft and thin liquids with supervision); continue encourage nepro and protein supplements tid 11. 10.Hep C +/Hx of EtOH abuse  Diet:Dysphagia 3 (Mechanical Soft);Thin liquid with Renal Failure restrictions 90 gram 2grm Potassium , 2gm sodium  1200ccpo per day  Discharge Medications:  allopurinol  300 mg  Oral  Daily  .  calcium acetate  667 mg  Oral  BID AC  .   Marland Kitchen  EPOGEN 20,000units Intravenous  Q Thrusaday-HD  .  feeding supplement (NEPRO CARB STEADY)  237 mL  Oral  TID BM  Venofer50mg   q weekly thrusday hd.  lamiVUDine  50 mg  Oral  QHS  .  lopinavir-ritonavir  5 mL  Oral  BID WC  .  nephrovite q day .  mupirocin ointment  Nasal  BID  .  nebivolol  20 mg  Oral  QHS  .  pantoprazole  40 mg  Oral  Q1200   .  paricalcitol  2 mcg  Intravenous  3 times weekly on hd .   .  zidovudine  300 mg  Oral   Prostat Sugar free 30ml tid meals  Tylenol 650mg  po every 6 hours as needed for pain Disposition:  improved  Follow up= Dr. Arbie Cookey OV  4weeks ID Clinic= Dr, Junie Panning  Call for apt . From [place] Urology = Dr. Sherron Monday  1 week after dc Discharge Vital Signs and Labs: Temp:  [97.1 F (36.2 C)-99 F (37.2 C)] 97.1 F (36.2 C) (08/03 1159) Pulse Rate:  [71-105] 105  (08/03 1159) Resp:  [13-18] 18  (08/03 1159) BP:  (90-129)/(49-97) 122/78 mmHg (08/03 1159) SpO2:  [96 %-100 %] 96 % (08/03 1134) Weight:  [86.9 kg (191 lb 9.3 oz)-88.6 kg (195 lb 5.2 oz)] 86.9 kg (191 lb 9.3 oz) (08/03 1134)  Basename 04/13/12 0740 04/12/12 0520  WBC 10.0 8.9  HGB 8.4* 8.7*  HCT 25.8* 26.6*  PLT 256 289   Renal:  Basename 04/13/12 0740 04/12/12 0520 04/11/12 0743  NA 136 139 --  K 4.2 3.9 --  CL 97 100 --  CO2 25 26 --  GLUCOSE 110* 102* --  BUN 62* 40* --  CREATININE 5.07* 3.57* --  CALCIUM 9.7 9.4 --  PHOS 5.9* -- 5.4*  ALBUMIN 2.0* -- 1.9*   Iron Studies: No results found for this basename: IRON,TIBC,TRANSFERRIN,FERRITIN in the last 72 hours  75 minutes were spent completing this discharge summary, discharge med reconciliation, discharge patient instructions and communicating discharge information to the patient's dialysis center.  Lenny Pastel, PA-C [facility] [phone] 04/13/2012, 2:56 PM  Patient seen and examined and agree with assessment and plan as  above.  Vinson Moselle  MD [facility] [phone] pgr    [phone] cell 04/14/2012, 9:00 AM

## 2012-04-13 NOTE — Procedures (Signed)
I was present at this dialysis session. I have reviewed the session itself and made appropriate changes.   Rob Yarisbel Miranda, MD [facility] 04/13/2012, 10:59 AM   

## 2012-04-13 NOTE — Progress Notes (Signed)
[facility]  Daily Progress Note  Assessment/Planning: POD #1 s/p L FA AVG   No evidence of steal  Follow in the office with Dr. Arbie Cookey in 4 weeks  Subjective  - 1 Day Post-Op  Mild pain from procedure  Objective Filed Vitals:   04/13/12 0740 04/13/12 0800 04/13/12 0830 04/13/12 0857  BP: 129/74 119/63 115/62 108/59  Pulse: 83 87 88 83  Temp:      TempSrc:      Resp:      Height:      Weight:      SpO2:        Intake/Output Summary (Last 24 hours) at 04/13/12 0926 Last data filed at 04/12/12 1615  Gross per 24 hour  Intake    100 ml  Output    180 ml  Net    -80 ml    PULM  CTAB CV  RRR GI  soft, NTND VASC  Cool finger, sensation intact, no palpable radial, hand grip 5/5, +bruit, +thrill  Laboratory CBC    Component Value Date/Time   WBC 10.0 04/13/2012 0740   HGB 8.4* 04/13/2012 0740   HCT 25.8* 04/13/2012 0740   PLT 256 04/13/2012 0740    BMET    Component Value Date/Time   NA 136 04/13/2012 0740   K 4.2 04/13/2012 0740   CL 97 04/13/2012 0740   CO2 25 04/13/2012 0740   GLUCOSE 110* 04/13/2012 0740   BUN 62* 04/13/2012 0740   CREATININE 5.07* 04/13/2012 0740   CREATININE 4.51* 04/17/2011 1521   CALCIUM 9.7 04/13/2012 0740   CALCIUM 8.7 04/11/2012 0743   GFRNONAA 11* 04/13/2012 0740   GFRAA 13* 04/13/2012 0740    Leonides Sake, MD [facility] Office: [phone] Pager: [phone]  04/13/2012, 9:26 AM

## 2012-04-14 NOTE — Progress Notes (Signed)
CSW was consulted to complete discharge of patient. Pt to transfer to [facility] today via [facility]. Sonny Dandy is aware of d/c. D/C packet complete with chart copy, signed FL2, and signed hard Rx.  CSW signing off as no other CSW needs identified at this time.  Lia Foyer, LCSWA [facility] Clinical Social Worker Contact #: [phone] (weekend)

## 2012-04-15 ENCOUNTER — Encounter (HOSPITAL_COMMUNITY): Payer: Self-pay | Admitting: Vascular Surgery

## 2012-04-18 LAB — CULTURE, BLOOD (ROUTINE X 2): Culture: NO GROWTH

## 2012-04-29 ENCOUNTER — Telehealth: Payer: Self-pay | Admitting: Licensed Clinical Social Worker

## 2012-04-29 NOTE — Telephone Encounter (Signed)
Bryan Gilmore from [facility] left a message for a returned call on the doctor's line, I returned her called and left a message for her to call  Back.

## 2012-05-13 DIAGNOSIS — M6281 Muscle weakness (generalized): Secondary | ICD-10-CM | POA: Diagnosis not present

## 2012-05-13 DIAGNOSIS — R279 Unspecified lack of coordination: Secondary | ICD-10-CM | POA: Diagnosis not present

## 2012-05-13 DIAGNOSIS — R262 Difficulty in walking, not elsewhere classified: Secondary | ICD-10-CM | POA: Diagnosis not present

## 2012-05-13 DIAGNOSIS — N186 End stage renal disease: Secondary | ICD-10-CM | POA: Diagnosis not present

## 2012-05-13 DIAGNOSIS — G9340 Encephalopathy, unspecified: Secondary | ICD-10-CM | POA: Diagnosis not present

## 2012-05-14 DIAGNOSIS — N186 End stage renal disease: Secondary | ICD-10-CM | POA: Diagnosis not present

## 2012-05-14 DIAGNOSIS — R279 Unspecified lack of coordination: Secondary | ICD-10-CM | POA: Diagnosis not present

## 2012-05-14 DIAGNOSIS — G9340 Encephalopathy, unspecified: Secondary | ICD-10-CM | POA: Diagnosis not present

## 2012-05-14 DIAGNOSIS — R262 Difficulty in walking, not elsewhere classified: Secondary | ICD-10-CM | POA: Diagnosis not present

## 2012-05-14 DIAGNOSIS — M6281 Muscle weakness (generalized): Secondary | ICD-10-CM | POA: Diagnosis not present

## 2012-05-15 DIAGNOSIS — R279 Unspecified lack of coordination: Secondary | ICD-10-CM | POA: Diagnosis not present

## 2012-05-15 DIAGNOSIS — M6281 Muscle weakness (generalized): Secondary | ICD-10-CM | POA: Diagnosis not present

## 2012-05-15 DIAGNOSIS — R262 Difficulty in walking, not elsewhere classified: Secondary | ICD-10-CM | POA: Diagnosis not present

## 2012-05-15 DIAGNOSIS — N186 End stage renal disease: Secondary | ICD-10-CM | POA: Diagnosis not present

## 2012-05-15 DIAGNOSIS — G9340 Encephalopathy, unspecified: Secondary | ICD-10-CM | POA: Diagnosis not present

## 2012-05-16 DIAGNOSIS — G9340 Encephalopathy, unspecified: Secondary | ICD-10-CM | POA: Diagnosis not present

## 2012-05-16 DIAGNOSIS — R279 Unspecified lack of coordination: Secondary | ICD-10-CM | POA: Diagnosis not present

## 2012-05-16 DIAGNOSIS — R262 Difficulty in walking, not elsewhere classified: Secondary | ICD-10-CM | POA: Diagnosis not present

## 2012-05-16 DIAGNOSIS — M6281 Muscle weakness (generalized): Secondary | ICD-10-CM | POA: Diagnosis not present

## 2012-05-16 DIAGNOSIS — N186 End stage renal disease: Secondary | ICD-10-CM | POA: Diagnosis not present

## 2012-05-17 DIAGNOSIS — R262 Difficulty in walking, not elsewhere classified: Secondary | ICD-10-CM | POA: Diagnosis not present

## 2012-05-17 DIAGNOSIS — N186 End stage renal disease: Secondary | ICD-10-CM | POA: Diagnosis not present

## 2012-05-17 DIAGNOSIS — G9340 Encephalopathy, unspecified: Secondary | ICD-10-CM | POA: Diagnosis not present

## 2012-05-17 DIAGNOSIS — M6281 Muscle weakness (generalized): Secondary | ICD-10-CM | POA: Diagnosis not present

## 2012-05-17 DIAGNOSIS — R279 Unspecified lack of coordination: Secondary | ICD-10-CM | POA: Diagnosis not present

## 2012-05-20 DIAGNOSIS — R262 Difficulty in walking, not elsewhere classified: Secondary | ICD-10-CM | POA: Diagnosis not present

## 2012-05-20 DIAGNOSIS — M6281 Muscle weakness (generalized): Secondary | ICD-10-CM | POA: Diagnosis not present

## 2012-05-20 DIAGNOSIS — R279 Unspecified lack of coordination: Secondary | ICD-10-CM | POA: Diagnosis not present

## 2012-05-20 DIAGNOSIS — N186 End stage renal disease: Secondary | ICD-10-CM | POA: Diagnosis not present

## 2012-05-20 DIAGNOSIS — G9340 Encephalopathy, unspecified: Secondary | ICD-10-CM | POA: Diagnosis not present

## 2012-05-21 ENCOUNTER — Other Ambulatory Visit (HOSPITAL_COMMUNITY): Payer: Self-pay | Admitting: Nephrology

## 2012-05-21 DIAGNOSIS — N186 End stage renal disease: Secondary | ICD-10-CM

## 2012-05-22 ENCOUNTER — Other Ambulatory Visit (INDEPENDENT_AMBULATORY_CARE_PROVIDER_SITE_OTHER): Payer: BC Managed Care – PPO

## 2012-05-22 DIAGNOSIS — B20 Human immunodeficiency virus [HIV] disease: Secondary | ICD-10-CM

## 2012-05-22 DIAGNOSIS — Z21 Asymptomatic human immunodeficiency virus [HIV] infection status: Secondary | ICD-10-CM

## 2012-05-22 DIAGNOSIS — Z113 Encounter for screening for infections with a predominantly sexual mode of transmission: Secondary | ICD-10-CM

## 2012-05-22 LAB — CBC WITH DIFFERENTIAL/PLATELET
Eosinophils Absolute: 0.1 10*3/uL (ref 0.0–0.7)
Hemoglobin: 12.7 g/dL — ABNORMAL LOW (ref 13.0–17.0)
Lymphs Abs: 1.7 10*3/uL (ref 0.7–4.0)
MCH: 32.9 pg (ref 26.0–34.0)
Monocytes Relative: 12 % (ref 3–12)
Neutrophils Relative %: 62 % (ref 43–77)
RBC: 3.86 MIL/uL — ABNORMAL LOW (ref 4.22–5.81)

## 2012-05-22 LAB — COMPLETE METABOLIC PANEL WITH GFR
Albumin: 3.3 g/dL — ABNORMAL LOW (ref 3.5–5.2)
CO2: 28 mEq/L (ref 19–32)
Calcium: 9.2 mg/dL (ref 8.4–10.5)
Chloride: 95 mEq/L — ABNORMAL LOW (ref 96–112)
GFR, Est African American: 23 mL/min — ABNORMAL LOW
GFR, Est Non African American: 20 mL/min — ABNORMAL LOW
Glucose, Bld: 111 mg/dL — ABNORMAL HIGH (ref 70–99)
Potassium: 3.6 mEq/L (ref 3.5–5.3)
Sodium: 135 mEq/L (ref 135–145)
Total Protein: 7.4 g/dL (ref 6.0–8.3)

## 2012-05-23 LAB — HIV-1 RNA QUANT-NO REFLEX-BLD: HIV 1 RNA Quant: <20 copies/mL (ref <20)

## 2012-05-27 ENCOUNTER — Ambulatory Visit (HOSPITAL_COMMUNITY)
Admission: RE | Admit: 2012-05-27 | Discharge: 2012-05-27 | Disposition: A | Discharge status: Home / Self Care | Payer: BC Managed Care – PPO | Source: Ambulatory Visit | Attending: Nephrology | Admitting: Nephrology

## 2012-05-27 VITALS — BP 137/78 | HR 68 | Temp 97.8°F

## 2012-05-27 DIAGNOSIS — Z452 Encounter for adjustment and management of vascular access device: Secondary | ICD-10-CM | POA: Insufficient documentation

## 2012-05-27 DIAGNOSIS — N186 End stage renal disease: Secondary | ICD-10-CM | POA: Insufficient documentation

## 2012-05-27 MED ORDER — CHLORHEXIDINE GLUCONATE 4 % EX LIQD
CUTANEOUS | Status: AC
  Filled 2012-05-27: qty 30

## 2012-05-27 NOTE — Procedures (Signed)
Removal tunnelled RIJ HD cath No complication No blood loss. See complete dictation in [facility].

## 2012-05-27 NOTE — Progress Notes (Signed)
Rt upper Chest site clean and dry.  Covered with gauze and transparent dressing

## 2012-05-27 NOTE — Progress Notes (Signed)
Site looks clean and dry.  Dressing is clean, dry and intact.

## 2012-05-28 ENCOUNTER — Telehealth (HOSPITAL_COMMUNITY): Payer: Self-pay | Admitting: *Deleted

## 2012-05-28 NOTE — Telephone Encounter (Signed)
Post procedure follow up call.  Spoke with pt, says doing well with no questions or concerns.

## 2012-06-05 ENCOUNTER — Ambulatory Visit (INDEPENDENT_AMBULATORY_CARE_PROVIDER_SITE_OTHER): Payer: BC Managed Care – PPO | Admitting: Internal Medicine

## 2012-06-05 ENCOUNTER — Ambulatory Visit: Payer: BC Managed Care – PPO

## 2012-06-05 ENCOUNTER — Encounter: Payer: Self-pay | Admitting: Internal Medicine

## 2012-06-05 ENCOUNTER — Other Ambulatory Visit: Payer: Self-pay | Admitting: Internal Medicine

## 2012-06-05 VITALS — BP 149/91 | HR 90 | Temp 97.8°F | Ht 71.0 in | Wt 196.0 lb

## 2012-06-05 DIAGNOSIS — N493 Fournier gangrene: Secondary | ICD-10-CM | POA: Insufficient documentation

## 2012-06-05 DIAGNOSIS — N501 Vascular disorders of male genital organs: Secondary | ICD-10-CM | POA: Diagnosis not present

## 2012-06-05 DIAGNOSIS — R369 Urethral discharge, unspecified: Secondary | ICD-10-CM

## 2012-06-05 NOTE — Addendum Note (Signed)
Addended by: Mariea Clonts D on: 06/05/2012 04:57 PM   Modules accepted: Orders

## 2012-06-05 NOTE — Progress Notes (Signed)
HIV CLINIC NOTE  RFV: follow up for routine HIV  Subjective:    Patient ID: Bryan Gilmore, male    DOB: [birth year], 61 y.o.   MRN: [id number]  HPI HIV/HCV, ESRD on HD, recently hospitalized in July for fornier's gangrene. CD 4 count of 330/VL,20, on raltegravir, lopinavir/rit, lamivudine, zidovudine  Discharged to [facility] rehab center in early August. Had sepsis for fournier's gangrene s/p debridement in mid july as well has hartman procedure with end colostomy and umbilical hernia repair. Had prolonged hospitalization inc. Staying at [facility]. He had urtheral stricture +/- urine diversion from wound, and has had foley placed since then. He states that he notices a little bit of mucous at tip of urethra, foul smelling. Unable to tell if any dysuria due to foley in place. He gets tues-thur-sat hemodialysis through left AVF. He has not seen urologist yet. He is doing well. Not yet participating in physical therapy. Eating well. Improved overall energy. His perineal wound is only requiring local dressing change.   Current Outpatient Prescriptions on File Prior to Visit  Medication Sig Dispense Refill  . allopurinol (ZYLOPRIM) 300 MG tablet Take 300 mg by mouth daily.      . calcium acetate (PHOSLO) 667 MG capsule Take 2 capsules (1,334 mg total) by mouth 3 (three) times daily with meals.  90 capsule  1  . calcium acetate (PHOSLO) 667 MG capsule Take 1 capsule (667 mg total) by mouth 3 (three) times daily with meals.      . feeding supplement (PRO-STAT SUGAR FREE 64) LIQD Take 30 mLs by mouth 3 (three) times daily with meals.  900 mL    . lamivudine (EPIVIR) 100 MG tablet Take 50 mg by mouth daily.      Marland Kitchen lopinavir-ritonavir (KALETRA) 400-100 MG/5ML solution Take 5 mLs (400 mg total) by mouth 2 (two) times daily with a meal.  160 mL  1  . multivitamin (RENA-VIT) TABS tablet Take 1 tablet by mouth at bedtime.      . Nebivolol HCl (BYSTOLIC) 20 MG TABS Take 20 mg by mouth daily.      . Nutritional  Supplements (FEEDING SUPPLEMENT, NEPRO CARB STEADY,) LIQD Take 237 mLs by mouth 3 (three) times daily between meals.      . pantoprazole (PROTONIX) 40 MG tablet Take 40 mg by mouth daily.      . paricalcitol (ZEMPLAR) 5 MCG/ML injection Inject 0.4 mLs (2 mcg total) into the vein 3 (three) times a week.  1 mL    . raltegravir (ISENTRESS) 400 MG tablet Take 400 mg by mouth 2 (two) times daily.        . zidovudine (RETROVIR) 100 MG capsule Take 300 mg by mouth daily.      . Nutritional Supplements (FEEDING SUPPLEMENT, NEPRO CARB STEADY,) LIQD Take 237 mLs by mouth 2 (two) times daily.       Active Ambulatory Problems    Diagnosis Date Noted  . HIV INFECTION 09/15/2010  . HEPATITIS C 10/07/2010  . GOUT, CHRONIC 10/07/2010  . 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 Normocytic anemia 02/17/2012  . ESRD (end stage renal disease) 02/29/2012  . Anemia of chronic renal failure 02/29/2012  . Hyperkalemia 03/29/2012  . Secondary hyperparathyroidism (of renal origin) 03/29/2012  . Fever 04/01/2012  . Delirium 04/01/2012  . Atrial fibrillation, permanent 04/04/2012  . Alcohol abuse   . Metabolic encephalopathy 04/06/2012   Resolved Ambulatory Problems    Diagnosis Date Noted  . Decreased libido 10/07/2010  .  Sepsis secondary to suspected Fornier's gangrene 02/17/2012  . ARF (acute renal failure) in the setting of stage III CKD 02/17/2012  . Metabolic acidosis 02/17/2012  . Hyperkalemia 02/17/2012  . Hyponatremia 02/17/2012  . Acute respiratory failure 02/17/2012   Past Medical History  Diagnosis Date  . Hypertension   . HIV (human immunodeficiency virus infection)   . Gout   . Hepatitis C   . Fournier's gangrene   . Anemia   . Severe protein-calorie malnutrition   . End stage renal disease        Review of Systems Review of Systems  Constitutional: Negative for fever, chills, diaphoresis, activity change, appetite change, fatigue and unexpected weight change.  HENT: Negative  for congestion, sore throat, rhinorrhea, sneezing, trouble swallowing and sinus pressure.  Eyes: Negative for photophobia and visual disturbance.  Respiratory: Negative for cough, chest tightness, shortness of breath, wheezing and stridor.  Cardiovascular: Negative for chest pain, palpitations and leg swelling.  Gastrointestinal: Negative for nausea, vomiting, abdominal pain, diarrhea, constipation, blood in stool, abdominal distention and anal bleeding.  Genitourinary: Negative for dysuria, hematuria, flank pain and difficulty urinating.  Musculoskeletal: Negative for myalgias, back pain, joint swelling, arthralgias and gait problem.  Skin: Negative for color change, pallor, rash and wound.  Neurological: Negative for dizziness, tremors, weakness and light-headedness.  Hematological: Negative for adenopathy. Does not bruise/bleed easily.  Psychiatric/Behavioral: Negative for behavioral problems, confusion, sleep disturbance, dysphoric mood, decreased concentration and agitation.       Objective:   Physical Exam  BP 149/91  Pulse 90  Temp 97.8 F (36.6 C) (Oral)  Ht 5\' 11"  (1.803 m)  Wt 196 lb (88.905 kg)  BMI 27.34 kg/m2 Physical Exam  Constitutional: He is oriented to person, place, and time. He appears well-developed and well-nourished. No distress.  HENT:  Mouth/Throat: Oropharynx is clear and moist. No oropharyngeal exudate.  Cardiovascular: Normal rate, regular rhythm and normal heart sounds. Exam reveals no gallop and no friction rub.  No murmur heard.  Pulmonary/Chest: Effort normal and breath sounds normal. No respiratory distress. He has no wheezes.  Abdominal: Soft. Bowel sounds are normal. He exhibits no distension. There is no tenderness.  Lymphadenopathy:  no cervical adenopathy.  GU= small subcm scrotal shallow ulcer, healing well. Perineal area has shallow 2cm x 5mm ulcer from surgery that is also has good granulation tissue, healing well. No exudate. Urethra has  foley in place with milky exudate Ext = left arm AFV.  Bilaterally gouty tophy on elbows, non tender.  Neurological: He is alert and oriented to person, place, and time.  Skin: Skin is warm and dry. No rash noted. No erythema.       Assessment & Plan:  Urethral discharge= did a swab for culture to see what is isolated  Retained foley catheter= patient not previously known to have a chronic foley prior to his hospitalization. It appears it was placed to divert urine from wound but possibly for urinary stricture. He will need to refer to urology to makes sure it can be removed without difficulty/urinary retention.  Fournier's gangrene = healing well, continue with local wound care  Pressure ulcer = healing, but still calloused on left lower leg, generally non-tender. Had abi that showed moderate PVD. Continue with local wound care  hiv = well controlled, virally suppressed. will continue on current regimen  Health maintenance = received flu vaccine at HD center  rtc in 3 months  Will call pt with results at [phone]

## 2012-06-10 DIAGNOSIS — N186 End stage renal disease: Secondary | ICD-10-CM | POA: Diagnosis not present

## 2012-06-10 DIAGNOSIS — D649 Anemia, unspecified: Secondary | ICD-10-CM | POA: Diagnosis not present

## 2012-06-10 DIAGNOSIS — L97909 Non-pressure chronic ulcer of unspecified part of unspecified lower leg with unspecified severity: Secondary | ICD-10-CM | POA: Diagnosis not present

## 2012-06-10 DIAGNOSIS — I10 Essential (primary) hypertension: Secondary | ICD-10-CM | POA: Diagnosis not present

## 2012-06-10 DIAGNOSIS — E43 Unspecified severe protein-calorie malnutrition: Secondary | ICD-10-CM | POA: Diagnosis not present

## 2012-06-10 LAB — CULTURE, ROUTINE-ABSCESS

## 2012-07-12 DIAGNOSIS — G9340 Encephalopathy, unspecified: Secondary | ICD-10-CM | POA: Diagnosis not present

## 2012-07-12 DIAGNOSIS — L97909 Non-pressure chronic ulcer of unspecified part of unspecified lower leg with unspecified severity: Secondary | ICD-10-CM | POA: Diagnosis not present

## 2012-07-12 DIAGNOSIS — N501 Vascular disorders of male genital organs: Secondary | ICD-10-CM | POA: Diagnosis not present

## 2012-07-12 DIAGNOSIS — R262 Difficulty in walking, not elsewhere classified: Secondary | ICD-10-CM | POA: Diagnosis not present

## 2012-07-15 ENCOUNTER — Other Ambulatory Visit (HOSPITAL_COMMUNITY): Payer: Self-pay | Admitting: Nephrology

## 2012-07-15 ENCOUNTER — Emergency Department (HOSPITAL_COMMUNITY): Payer: Medicare Other | Admitting: Certified Registered"

## 2012-07-15 ENCOUNTER — Encounter (HOSPITAL_COMMUNITY): Payer: Self-pay | Admitting: Certified Registered"

## 2012-07-15 ENCOUNTER — Encounter (HOSPITAL_COMMUNITY): Payer: Self-pay

## 2012-07-15 ENCOUNTER — Other Ambulatory Visit: Payer: Self-pay | Admitting: *Deleted

## 2012-07-15 ENCOUNTER — Inpatient Hospital Stay: Admit: 2012-07-15 | Payer: Self-pay | Admitting: Vascular Surgery

## 2012-07-15 ENCOUNTER — Emergency Department (HOSPITAL_COMMUNITY)
Admission: EM | Admit: 2012-07-15 | Discharge: 2012-07-15 | Disposition: A | Discharge status: Home / Self Care | Payer: Medicare Other | Attending: Emergency Medicine | Admitting: Emergency Medicine

## 2012-07-15 ENCOUNTER — Ambulatory Visit (HOSPITAL_COMMUNITY)
Admission: RE | Admit: 2012-07-15 | Discharge: 2012-07-15 | Disposition: A | Discharge status: Home / Self Care | Payer: Medicare Other | Source: Ambulatory Visit | Attending: Nephrology | Admitting: Nephrology

## 2012-07-15 ENCOUNTER — Encounter (HOSPITAL_COMMUNITY)
Admission: EM | Disposition: A | Discharge status: Home / Self Care | Payer: Self-pay | Source: Home / Self Care | Attending: Emergency Medicine

## 2012-07-15 DIAGNOSIS — Y849 Medical procedure, unspecified as the cause of abnormal reaction of the patient, or of later complication, without mention of misadventure at the time of the procedure: Secondary | ICD-10-CM | POA: Insufficient documentation

## 2012-07-15 DIAGNOSIS — Z992 Dependence on renal dialysis: Secondary | ICD-10-CM | POA: Insufficient documentation

## 2012-07-15 DIAGNOSIS — N186 End stage renal disease: Secondary | ICD-10-CM

## 2012-07-15 DIAGNOSIS — I4891 Unspecified atrial fibrillation: Secondary | ICD-10-CM | POA: Diagnosis not present

## 2012-07-15 DIAGNOSIS — Z21 Asymptomatic human immunodeficiency virus [HIV] infection status: Secondary | ICD-10-CM | POA: Insufficient documentation

## 2012-07-15 DIAGNOSIS — I120 Hypertensive chronic kidney disease with stage 5 chronic kidney disease or end stage renal disease: Secondary | ICD-10-CM | POA: Insufficient documentation

## 2012-07-15 DIAGNOSIS — T82898A Other specified complication of vascular prosthetic devices, implants and grafts, initial encounter: Secondary | ICD-10-CM

## 2012-07-15 DIAGNOSIS — I10 Essential (primary) hypertension: Secondary | ICD-10-CM | POA: Diagnosis not present

## 2012-07-15 DIAGNOSIS — A419 Sepsis, unspecified organism: Secondary | ICD-10-CM | POA: Diagnosis not present

## 2012-07-15 DIAGNOSIS — N189 Chronic kidney disease, unspecified: Secondary | ICD-10-CM | POA: Diagnosis not present

## 2012-07-15 DIAGNOSIS — K759 Inflammatory liver disease, unspecified: Secondary | ICD-10-CM | POA: Diagnosis not present

## 2012-07-15 DIAGNOSIS — Y832 Surgical operation with anastomosis, bypass or graft as the cause of abnormal reaction of the patient, or of later complication, without mention of misadventure at the time of the procedure: Secondary | ICD-10-CM | POA: Insufficient documentation

## 2012-07-15 DIAGNOSIS — H33049 Retinal detachment with retinal dialysis, unspecified eye: Secondary | ICD-10-CM | POA: Diagnosis not present

## 2012-07-15 DIAGNOSIS — T82598A Other mechanical complication of other cardiac and vascular devices and implants, initial encounter: Secondary | ICD-10-CM | POA: Diagnosis not present

## 2012-07-15 HISTORY — PX: THROMBECTOMY AND REVISION OF ARTERIOVENTOUS (AV) GORETEX  GRAFT: SHX6120

## 2012-07-15 HISTORY — DX: Cardiac arrhythmia, unspecified: I49.9

## 2012-07-15 LAB — APTT: aPTT: 31 seconds (ref 24–37)

## 2012-07-15 LAB — POCT I-STAT 4, (NA,K, GLUC, HGB,HCT): Sodium: 138 mEq/L (ref 135–145)

## 2012-07-15 SURGERY — THROMBECTOMY AND REVISION OF ARTERIOVENTOUS (AV) GORETEX  GRAFT
Anesthesia: Monitor Anesthesia Care | Site: Arm Lower | Laterality: Left | Wound class: Clean

## 2012-07-15 MED ORDER — THROMBIN 20000 UNITS EX SOLR
CUTANEOUS | Status: DC | PRN
  Administered 2012-07-15: 18:00:00 via TOPICAL

## 2012-07-15 MED ORDER — HYDROMORPHONE HCL PF 1 MG/ML IJ SOLN
INTRAMUSCULAR | Status: AC
  Filled 2012-07-15: qty 1

## 2012-07-15 MED ORDER — ONDANSETRON HCL 4 MG/2ML IJ SOLN
INTRAMUSCULAR | Status: DC | PRN
  Administered 2012-07-15: 4 mg via INTRAVENOUS

## 2012-07-15 MED ORDER — OXYCODONE HCL 5 MG PO TABS: 5.0000 mg | ORAL_TABLET | Freq: Four times a day (QID) | ORAL | Status: DC | PRN

## 2012-07-15 MED ORDER — 0.9 % SODIUM CHLORIDE (POUR BTL) OPTIME
TOPICAL | Status: DC | PRN
  Administered 2012-07-15: 1000 mL

## 2012-07-15 MED ORDER — FENTANYL CITRATE 0.05 MG/ML IJ SOLN
INTRAMUSCULAR | Status: AC | PRN
  Administered 2012-07-15: 25 ug via INTRAVENOUS

## 2012-07-15 MED ORDER — ONDANSETRON HCL 4 MG/2ML IJ SOLN: 4.0000 mg | Freq: Once | INTRAMUSCULAR | Status: DC | PRN

## 2012-07-15 MED ORDER — ALTEPLASE 100 MG IV SOLR
4.0000 mg | INTRAVENOUS | Status: AC
  Administered 2012-07-15: 2 mg
  Filled 2012-07-15: qty 4

## 2012-07-15 MED ORDER — FENTANYL CITRATE 0.05 MG/ML IJ SOLN
INTRAMUSCULAR | Status: AC
  Filled 2012-07-15: qty 2

## 2012-07-15 MED ORDER — SODIUM CHLORIDE 0.9 % IR SOLN
Status: DC | PRN
  Administered 2012-07-15: 18:00:00

## 2012-07-15 MED ORDER — IOHEXOL 300 MG/ML  SOLN
100.0000 mL | Freq: Once | INTRAMUSCULAR | Status: AC | PRN
  Administered 2012-07-15: 40 mL via INTRAVENOUS

## 2012-07-15 MED ORDER — MIDAZOLAM HCL 2 MG/2ML IJ SOLN
INTRAMUSCULAR | Status: AC | PRN
  Administered 2012-07-15: 1 mg via INTRAVENOUS

## 2012-07-15 MED ORDER — DEXTROSE 5 % IV SOLN
1.5000 g | INTRAVENOUS | Status: AC
  Administered 2012-07-15: 1.5 g via INTRAVENOUS
  Filled 2012-07-15: qty 1.5

## 2012-07-15 MED ORDER — THROMBIN 20000 UNITS EX SOLR
CUTANEOUS | Status: AC
  Filled 2012-07-15: qty 20000

## 2012-07-15 MED ORDER — LIDOCAINE HCL (CARDIAC) 20 MG/ML IV SOLN
INTRAVENOUS | Status: DC | PRN
  Administered 2012-07-15: 100 mg via INTRAVENOUS

## 2012-07-15 MED ORDER — MUPIROCIN 2 % EX OINT
TOPICAL_OINTMENT | CUTANEOUS | Status: AC
  Filled 2012-07-15: qty 22

## 2012-07-15 MED ORDER — PROPOFOL 10 MG/ML IV BOLUS
INTRAVENOUS | Status: DC | PRN
  Administered 2012-07-15: 130 mg via INTRAVENOUS

## 2012-07-15 MED ORDER — ARTIFICIAL TEARS OP OINT
TOPICAL_OINTMENT | OPHTHALMIC | Status: DC | PRN
  Administered 2012-07-15: 1 via OPHTHALMIC

## 2012-07-15 MED ORDER — HEPARIN SODIUM (PORCINE) 1000 UNIT/ML IJ SOLN
INTRAMUSCULAR | Status: DC | PRN
  Administered 2012-07-15: 5000 [IU] via INTRAVENOUS

## 2012-07-15 MED ORDER — MIDAZOLAM HCL 2 MG/2ML IJ SOLN
INTRAMUSCULAR | Status: AC
  Filled 2012-07-15: qty 2

## 2012-07-15 MED ORDER — FENTANYL CITRATE 0.05 MG/ML IJ SOLN
INTRAMUSCULAR | Status: DC | PRN
  Administered 2012-07-15: 50 ug via INTRAVENOUS
  Administered 2012-07-15 (×2): 100 ug via INTRAVENOUS

## 2012-07-15 MED ORDER — HYDROMORPHONE HCL PF 1 MG/ML IJ SOLN: 0.2500 mg | INTRAMUSCULAR | Status: DC | PRN

## 2012-07-15 MED ORDER — DEXTROSE 5 % IV SOLN
INTRAVENOUS | Status: AC
  Filled 2012-07-15: qty 50

## 2012-07-15 MED ORDER — HEPARIN SODIUM (PORCINE) 1000 UNIT/ML IJ SOLN
INTRAMUSCULAR | Status: AC
  Administered 2012-07-15: 3000 [IU]
  Filled 2012-07-15: qty 1

## 2012-07-15 MED ORDER — EPHEDRINE SULFATE 50 MG/ML IJ SOLN
INTRAMUSCULAR | Status: DC | PRN
  Administered 2012-07-15: 15 mg via INTRAVENOUS
  Administered 2012-07-15 (×3): 10 mg via INTRAVENOUS

## 2012-07-15 MED ORDER — DEXAMETHASONE SODIUM PHOSPHATE 4 MG/ML IJ SOLN
INTRAMUSCULAR | Status: DC | PRN
  Administered 2012-07-15: 4 mg via INTRAVENOUS

## 2012-07-15 MED ORDER — CEFUROXIME SODIUM 1.5 G IJ SOLR
INTRAMUSCULAR | Status: AC
  Filled 2012-07-15: qty 1.5

## 2012-07-15 MED ORDER — SODIUM CHLORIDE 0.9 % IV SOLN
INTRAVENOUS | Status: DC
  Administered 2012-07-15: 17:00:00 via INTRAVENOUS

## 2012-07-15 SURGICAL SUPPLY — 72 items
ADH SKN CLS APL DERMABOND .7 (GAUZE/BANDAGES/DRESSINGS) ×4
BAG DECANTER FOR FLEXI CONT (MISCELLANEOUS) ×1 IMPLANT
BANDAGE GAUZE ELAST BULKY 4 IN (GAUZE/BANDAGES/DRESSINGS) ×2 IMPLANT
CANISTER SUCTION 2500CC (MISCELLANEOUS) ×3 IMPLANT
CATH CANNON HEMO 15F 50CM (CATHETERS) IMPLANT
CATH CANNON HEMO 15FR 19 (HEMODIALYSIS SUPPLIES) IMPLANT
CATH CANNON HEMO 15FR 23CM (HEMODIALYSIS SUPPLIES) IMPLANT
CATH CANNON HEMO 15FR 31CM (HEMODIALYSIS SUPPLIES) IMPLANT
CATH CANNON HEMO 15FR 32 (HEMODIALYSIS SUPPLIES) IMPLANT
CATH CANNON HEMO 15FR 32CM (HEMODIALYSIS SUPPLIES) IMPLANT
CATH EMB 4FR 80CM (CATHETERS) ×3 IMPLANT
CHLORAPREP W/TINT 26ML (MISCELLANEOUS) ×1 IMPLANT
CLIP TI MEDIUM 6 (CLIP) ×3 IMPLANT
CLIP TI WIDE RED SMALL 6 (CLIP) ×3 IMPLANT
CLOTH BEACON ORANGE TIMEOUT ST (SAFETY) ×3 IMPLANT
COVER PROBE W GEL 5X96 (DRAPES) IMPLANT
COVER SURGICAL LIGHT HANDLE (MISCELLANEOUS) ×3 IMPLANT
DECANTER SPIKE VIAL GLASS SM (MISCELLANEOUS) ×1 IMPLANT
DERMABOND ADVANCED (GAUZE/BANDAGES/DRESSINGS) ×2
DERMABOND ADVANCED .7 DNX12 (GAUZE/BANDAGES/DRESSINGS) ×3 IMPLANT
DRAPE C-ARM 42X72 X-RAY (DRAPES) ×1 IMPLANT
DRAPE CHEST BREAST 15X10 FENES (DRAPES) ×1 IMPLANT
DRAPE X-RAY CASS 24X20 (DRAPES) IMPLANT
DRSG PAD ABDOMINAL 8X10 ST (GAUZE/BANDAGES/DRESSINGS) ×2 IMPLANT
ELECT REM PT RETURN 9FT ADLT (ELECTROSURGICAL) ×3
ELECTRODE REM PT RTRN 9FT ADLT (ELECTROSURGICAL) ×2 IMPLANT
GAUZE SPONGE 2X2 8PLY STRL LF (GAUZE/BANDAGES/DRESSINGS) ×1 IMPLANT
GAUZE SPONGE 4X4 16PLY XRAY LF (GAUZE/BANDAGES/DRESSINGS) ×3 IMPLANT
GEL ULTRASOUND 20GR AQUASONIC (MISCELLANEOUS) IMPLANT
GLOVE BIO SURGEON STRL SZ 6.5 (GLOVE) ×4 IMPLANT
GLOVE BIO SURGEON STRL SZ7.5 (GLOVE) ×3 IMPLANT
GLOVE BIOGEL PI IND STRL 6.5 (GLOVE) ×3 IMPLANT
GLOVE BIOGEL PI IND STRL 7.0 (GLOVE) ×1 IMPLANT
GLOVE BIOGEL PI INDICATOR 6.5 (GLOVE) ×3
GLOVE BIOGEL PI INDICATOR 7.0 (GLOVE) ×1
GLOVE ECLIPSE 6.5 STRL STRAW (GLOVE) ×2 IMPLANT
GLOVE ECLIPSE 7.5 STRL STRAW (GLOVE) ×2 IMPLANT
GOWN PREVENTION PLUS XLARGE (GOWN DISPOSABLE) ×3 IMPLANT
GOWN STRL NON-REIN LRG LVL3 (GOWN DISPOSABLE) ×6 IMPLANT
GRAFT GORETEX 6X10 (Vascular Products) ×2 IMPLANT
KIT BASIN OR (CUSTOM PROCEDURE TRAY) ×3 IMPLANT
KIT ROOM TURNOVER OR (KITS) ×3 IMPLANT
LOOP VESSEL MINI RED (MISCELLANEOUS) ×2 IMPLANT
NDL 18GX1X1/2 (RX/OR ONLY) (NEEDLE) ×1 IMPLANT
NDL HYPO 25GX1X1/2 BEV (NEEDLE) ×1 IMPLANT
NEEDLE 18GX1X1/2 (RX/OR ONLY) (NEEDLE) IMPLANT
NEEDLE HYPO 25GX1X1/2 BEV (NEEDLE) IMPLANT
NS IRRIG 1000ML POUR BTL (IV SOLUTION) ×3 IMPLANT
PACK CV ACCESS (CUSTOM PROCEDURE TRAY) ×3 IMPLANT
PACK SURGICAL SETUP 50X90 (CUSTOM PROCEDURE TRAY) ×1 IMPLANT
PAD ARMBOARD 7.5X6 YLW CONV (MISCELLANEOUS) ×6 IMPLANT
SET COLLECT BLD 21X3/4 12 (NEEDLE) IMPLANT
SPONGE GAUZE 2X2 STER 10/PKG (GAUZE/BANDAGES/DRESSINGS)
SPONGE GAUZE 4X4 12PLY (GAUZE/BANDAGES/DRESSINGS) ×2 IMPLANT
SPONGE SURGIFOAM ABS GEL 100 (HEMOSTASIS) ×2 IMPLANT
STOPCOCK 4 WAY LG BORE MALE ST (IV SETS) IMPLANT
SUT ETHILON 3 0 PS 1 (SUTURE) ×5 IMPLANT
SUT PROLENE 6 0 CC (SUTURE) ×5 IMPLANT
SUT VIC AB 3-0 SH 27 (SUTURE) ×6
SUT VIC AB 3-0 SH 27X BRD (SUTURE) ×3 IMPLANT
SUT VICRYL 4-0 PS2 18IN ABS (SUTURE) ×5 IMPLANT
SYR 20CC LL (SYRINGE) ×2 IMPLANT
SYR 30ML LL (SYRINGE) IMPLANT
SYR 5ML LL (SYRINGE) ×2 IMPLANT
SYR CONTROL 10ML LL (SYRINGE) ×1 IMPLANT
SYRINGE 10CC LL (SYRINGE) ×1 IMPLANT
TAPE CLOTH SURG 4X10 WHT LF (GAUZE/BANDAGES/DRESSINGS) ×2 IMPLANT
TOWEL OR 17X24 6PK STRL BLUE (TOWEL DISPOSABLE) ×3 IMPLANT
TOWEL OR 17X26 10 PK STRL BLUE (TOWEL DISPOSABLE) ×3 IMPLANT
TUBING EXTENTION W/L.L. (IV SETS) IMPLANT
UNDERPAD 30X30 INCONTINENT (UNDERPADS AND DIAPERS) ×3 IMPLANT
WATER STERILE IRR 1000ML POUR (IV SOLUTION) ×3 IMPLANT

## 2012-07-15 NOTE — Anesthesia Postprocedure Evaluation (Signed)
Anesthesia Post Note  Patient: Bryan Gilmore  Procedure(s) Performed: Procedure(s) (LRB): THROMBECTOMY AND REVISION OF ARTERIOVENTOUS (AV) GORETEX  GRAFT (Left)  Anesthesia type: General  Patient location: PACU  Post pain: Pain level controlled and Adequate analgesia  Post assessment: Post-op Vital signs reviewed, Patient's Cardiovascular Status Stable, Respiratory Function Stable, Patent Airway and Pain level controlled  Last Vitals:  Filed Vitals:   07/15/12 1930  BP: 143/76  Pulse:   Temp:   Resp:     Post vital signs: Reviewed and stable  Level of consciousness: awake, alert  and oriented  Complications: No apparent anesthesia complications

## 2012-07-15 NOTE — Progress Notes (Signed)
Patient came with foley catheter from [facility] per patient

## 2012-07-15 NOTE — H&P (Signed)
[facility]ORT STAY H&P  CC:  Clotted graft left arm  HPI: Thrombolysis and declot in IR this am.  Pt clotted on dialysis today.  Past Medical History  Diagnosis Date  . Hypertension   . HIV (human immunodeficiency virus infection)   . Sepsis(995.91)   . Gout   . Hepatitis C   . Fournier's gangrene   . Anemia   . Severe protein-calorie malnutrition   . End stage renal disease   . Alcohol abuse   . Dysrhythmia     AF    FH:  Non-Contributory  Social HX History  Substance Use Topics  . Smoking status: Never Smoker   . Smokeless tobacco: Never Used  . Alcohol Use: No  Lives at [facility]  ROS: Denies dyspnea.  Denies chest pain  Allergies No Known Allergies  Medications Current Facility-Administered Medications  Medication Dose Route Frequency Provider Last Rate Last Dose  . 0.9 %  sodium chloride infusion   Intravenous Continuous Sherren Kerns, MD 20 mL/hr at 07/15/12 1635     Current Outpatient Prescriptions  Medication Sig Dispense Refill  . allopurinol (ZYLOPRIM) 300 MG tablet Take 300 mg by mouth daily.      . calcium acetate (PHOSLO) 667 MG capsule Take 2 capsules (1,334 mg total) by mouth 3 (three) times daily with meals.  90 capsule  1  . calcium acetate (PHOSLO) 667 MG capsule Take 1 capsule (667 mg total) by mouth 3 (three) times daily with meals.      . feeding supplement (PRO-STAT SUGAR FREE 64) LIQD Take 30 mLs by mouth 3 (three) times daily with meals.  900 mL    . lamivudine (EPIVIR) 100 MG tablet Take 50 mg by mouth daily.      Marland Kitchen lopinavir-ritonavir (KALETRA) 400-100 MG/5ML solution Take 5 mLs (400 mg total) by mouth 2 (two) times daily with a meal.  160 mL  1  . multivitamin (RENA-VIT) TABS tablet Take 1 tablet by mouth at bedtime.      . Nebivolol HCl (BYSTOLIC) 20 MG TABS Take 20 mg by mouth daily.      . Nutritional Supplements (FEEDING SUPPLEMENT, NEPRO CARB STEADY,) LIQD Take 237 mLs by mouth 3 (three) times daily with  meals.       . pantoprazole (PROTONIX) 40 MG tablet Take 40 mg by mouth daily.      . raltegravir (ISENTRESS) 400 MG tablet Take 400 mg by mouth 2 (two) times daily.        . zidovudine (RETROVIR) 100 MG capsule Take 300 mg by mouth daily.       Facility-Administered Medications Ordered in Other Encounters  Medication Dose Route Frequency Provider Last Rate Last Dose  . [COMPLETED] alteplase (ACTIVASE) injection 4 mg  4 mg Intracatheter to Loma Newton, MD   2 mg at 07/15/12 0926  . cefUROXime (ZINACEF) 1.5 g in dextrose 5 % 50 mL IVPB  1.5 g Intravenous 30 min Pre-Op Sherren Kerns, MD      . fentaNYL (SUBLIMAZE) 0.05 MG/ML injection           . [COMPLETED] fentaNYL (SUBLIMAZE) injection   Intravenous PRN Reola Calkins, MD   25 mcg at 07/15/12 0915  . [COMPLETED] heparin 1000 UNIT/ML injection        3,000 Units at 07/15/12 0924  . [COMPLETED] iohexol (OMNIPAQUE) 300 MG/ML solution 100 mL  100 mL Intravenous Once PRN Medication Radiologist, MD   40 mL  at 07/15/12 1004  . midazolam (VERSED) 2 MG/2ML injection           . [COMPLETED] midazolam (VERSED) injection   Intravenous PRN Reola Calkins, MD   1 mg at 07/15/12 0915  . mupirocin ointment (BACTROBAN) 2 %               PHYSICAL EXAM  Filed Vitals:   07/15/12 1546  BP: 136/91  Pulse: 55  Temp: 97.6 F (36.4 C)  Resp: 18    General:  WDWN in NAD HENT: WNL Eyes: Pupils equal Pulmonary: normal non-labored breathing , without Rales, rhonchi,  wheezing Cardiac: RRR, Vascular Exam/Pulses:  No bruit or thrill in graft left forearm, 1+ radial pulse Extremities without ischemic changes, no Gangrene , no cellulitis; no open wounds;  Neuro A&O x 3; good sensation; motion in all extremities  Impression: Thrombosed left forearm graft  Plan: Thrombectomy and revision left forearm AV graft Jemina Scahill E @TODAY @ 4:40 PM

## 2012-07-15 NOTE — Transfer of Care (Signed)
Immediate Anesthesia Transfer of Care Note  Patient: Bryan Gilmore  Procedure(s) Performed: Procedure(s) (LRB) with comments: THROMBECTOMY AND REVISION OF ARTERIOVENTOUS (AV) GORETEX  GRAFT (Left)  Patient Location: PACU  Anesthesia Type:General  Level of Consciousness: awake, alert , oriented and patient cooperative  Airway & Oxygen Therapy: Patient Spontanous Breathing and Patient connected to nasal cannula oxygen  Post-op Assessment: Report given to PACU RN, Post -op Vital signs reviewed and stable and Patient moving all extremities X 4  Post vital signs: Reviewed and stable  Complications: No apparent anesthesia complications

## 2012-07-15 NOTE — ED Notes (Signed)
Pt reports clogged dialysis shunt in left arm. Normally Tues, Thurs, Sat dialysis. Has not had dialysis since Thursday.

## 2012-07-15 NOTE — Anesthesia Procedure Notes (Signed)
Procedure Name: LMA Insertion Date/Time: 07/15/2012 5:14 PM Performed by: Jefm Miles E Pre-anesthesia Checklist: Patient identified, Timeout performed, Emergency Drugs available, Suction available and Patient being monitored Patient Re-evaluated:Patient Re-evaluated prior to inductionOxygen Delivery Method: Circle system utilized Preoxygenation: Pre-oxygenation with 100% oxygen Intubation Type: IV induction Ventilation: Mask ventilation without difficulty LMA: LMA inserted LMA Size: 4.0 Number of attempts: 1 Placement Confirmation: positive ETCO2 Tube secured with: Tape Dental Injury: Teeth and Oropharynx as per pre-operative assessment

## 2012-07-15 NOTE — Anesthesia Preprocedure Evaluation (Addendum)
Anesthesia Evaluation  Patient identified by MRN, date of birth, ID band Patient awake    Reviewed: Allergy & Precautions, H&P , NPO status , Patient's Chart, lab work & pertinent test results  Airway Mallampati: I TM Distance: >3 FB Neck ROM: full    Dental  (+) Poor Dentition and Dental Advisory Given   Pulmonary  breath sounds clear to auscultation        Cardiovascular hypertension, + dysrhythmias Atrial Fibrillation Rhythm:irregular Rate:Normal     Neuro/Psych PSYCHIATRIC DISORDERS    GI/Hepatic (+) Hepatitis -  Endo/Other    Renal/GU ESRF, Dialysis and CRFRenal disease     Musculoskeletal   Abdominal   Peds  Hematology   Anesthesia Other Findings   Reproductive/Obstetrics                          Anesthesia Physical Anesthesia Plan  ASA: III  Anesthesia Plan: MAC   Post-op Pain Management:    Induction: Intravenous  Airway Management Planned: LMA  Additional Equipment:   Intra-op Plan:   Post-operative Plan: Extubation in OR  Informed Consent: I have reviewed the patients History and Physical, chart, labs and discussed the procedure including the risks, benefits and alternatives for the proposed anesthesia with the patient or authorized representative who has indicated his/her understanding and acceptance.     Plan Discussed with: CRNA, Anesthesiologist and Surgeon  Anesthesia Plan Comments:         Anesthesia Quick Evaluation

## 2012-07-15 NOTE — ED Notes (Signed)
Time out completed at 0913am

## 2012-07-15 NOTE — H&P (Signed)
Bryan Gilmore is an 61 y.o. male.   Chief Complaint: left arm dialysis graft clotted Last use 10/31: good flow 11/2: clotted Never imaged; only 5 months old Pt has been using dialysis catheter placed by surgeon until 6 weeks ago Scheduled now for graft thrombolysis and poss angioplasty/stent placement. possible dialysis catheter placement if needed HPI: HIV; HTN; Hep C; ESRD; gout  Past Medical History  Diagnosis Date  . Hypertension   . HIV (human immunodeficiency virus infection)   . Sepsis(995.91)   . Gout   . Hepatitis C   . Fournier's gangrene   . Anemia   . Severe protein-calorie malnutrition   . End stage renal disease   . Alcohol abuse     Past Surgical History  Procedure Date  . Cystoscopy with urethral dilatation 02/17/2012    Procedure: CYSTOSCOPY WITH URETHRAL DILATATION;  Surgeon: Martina Sinner, MD;  Location: [facility] ORS;  Service: Urology;  Laterality: N/A;  retrograde urethragram, placement of foley catheter exam under anesthesia   . Insertion of dialysis catheter 02/20/2012    Procedure: INSERTION OF DIALYSIS CATHETER;  Surgeon: Chuck Hint, MD;  Location: [facility] OR;  Service: Vascular;  Laterality: N/A;  . Irrigation and debridement abscess 02/29/2012    Procedure: IRRIGATION AND DEBRIDEMENT ABSCESS;  Surgeon: Martina Sinner, MD;  Location: [facility] OR;  Service: Urology;  Laterality: N/A;  I&D of scrotum abcess  . Insertion of dialysis catheter 03/29/2012    Procedure: INSERTION OF DIALYSIS CATHETER;  Surgeon: Larina Earthly, MD;  Location: [facility] OR;  Service: Vascular;  Laterality: N/A;  Insertion tunneled dialysis catheter right IJ  . Av fistula placement 04/12/2012    Procedure: INSERTION OF ARTERIOVENOUS (AV) GORE-TEX GRAFT ARM;  Surgeon: Larina Earthly, MD;  Location: [facility] OR;  Service: Vascular;  Laterality: Left;  insertion of 6x50 stretch goretex graft     Family History  Problem Relation Age of Onset  . Hypertension Mother   . Hypertension Father    Social  History:  reports that he has never smoked. He has never used smokeless tobacco. He reports that he does not drink alcohol or use illicit drugs.  Allergies: No Known Allergies   (Not in a hospital admission)  No results found for this or any previous visit (from the past 48 hour(s)). No results found.  Review of Systems  Respiratory: Negative for shortness of breath.   Cardiovascular: Negative for chest pain.  Gastrointestinal: Negative for nausea and vomiting.  Neurological: Negative for weakness.    Blood pressure 136/83, pulse 58, temperature 97.6 F (36.4 C), temperature source Oral. Physical Exam  Constitutional: He is oriented to person, place, and time.  Cardiovascular: Normal heart sounds.        Irreg rhythm: afib  Respiratory: Effort normal and breath sounds normal. He has no wheezes.  GI: Soft. Bowel sounds are normal. There is no tenderness.  Musculoskeletal: Normal range of motion.       Left forearm graft no thrill; no pulse  Neurological: He is alert and oriented to person, place, and time.  Skin: Skin is warm and dry.  Psychiatric: He has a normal mood and affect. His behavior is normal. Judgment and thought content normal.     Assessment/Plan Left arm dialysis graft clotted Scheduled now for thrombolysis and poss pta/stent. Poss dialysis catheter placement if needed Pt aware of procedure benefits and risks and agreeable to proceed Consent signed and in chart  Bekah Igoe A 07/15/2012, 8:03 AM

## 2012-07-15 NOTE — H&P (Signed)
Agree 

## 2012-07-15 NOTE — Op Note (Signed)
Procedure: Thrombectomy and revision of left forearm AV graft  Preoperative diagnosis: Thrombosed AV graft left forearm  Postoperative diagnosis: Same  Anesthesia: General  Assistant: Doreatha Massed PA-C  Operative findings: 3 mm outflow vein, revised to 6 mm interposition graft  Operative details: After obtaining informed consent, the patient was taken to the operating room. The patient was placed in supine position on the operating room table. After administration of general anesthesia, the patient's entire left upper extremity was prepped and draped in the usual sterile fashion. A transverse incision was made in the antecubital location through a pre-existing scar. The incision was carried into the subcutaneous tissues down to level the graft. The graft was dissected free circumferentially. Dissection was carried down to the level of the venous anastomosis. This was dissected free circumferentially. The patient was given 5,000 units of intravenous heparin. A transverse graftotomy was made just above the level of the anastomosis. A #4 Fogarty catheter was used to thrombectomize the venous limb of the graft. There was some venous backbleeding but there were still intimal hyperplasia within the vein and the vein was quite thickened. Therefore the vein was opened longitudinally past the segment intimal hyperplasia. The thickened portion of vein was debrided away.  An additional longitudinal incision was made a few centimeters above the antecubital incision and carried down to a segment of more proximal vein which was about 3 mm diameter.  This was a suitable outflow vein and was dissected free circumferentially. The arterial limb the graft was then thrombectomized with a #4 Fogarty catheter excellent arterial inflow was obtained.   There were several needle holes bleeding throughout the graft body and these were repaired with 3 0 Nylon sutures.  This graft was clamped proximally with a fistula clamp. A  new 6 mm interposition graft was brought up in the operative field and sewn end-to-side to the vein using a running 6-0 Prolene suture. At completion of the anastomosis the venous limb was thoroughly flushed with heparinized saline and reoccluded. The graft was cut to length in preparation for sewing to the proximal graft. This was then sewn end-to-end to the proximal arterial limb of the graft using running 6-0 Prolene suture. Just prior to completion, the anastomosis was forebled backbled and thoroughly flushed. The anastomosis was secured; clamps were released; and there was a palpable thrill in the graft immediately. Hemostasis was obtained with direct pressure and thrombin gelfoam. The subcutaneous tissues were reapproximated with running 3-0 Vicryl suture. The skin was closed with a 4 0 Vicryl subcuticular stitch. Dermabond was applied the incision. The patient tolerated the procedure well and there were no complications. Instrument sponge and needle counts were correct at the end of the case. The patient was taken to the recovery room in stable condition.  The patient had audible radial doppler flow at the end of the case.  Fabienne Bruns, MD [facility] Office: [phone] Pager: [phone]

## 2012-07-15 NOTE — Preoperative (Signed)
Beta Blockers   Reason not to administer Beta Blockers:Not Applicable 

## 2012-07-15 NOTE — ED Provider Notes (Signed)
MSE was initiated and I personally evaluated the patient and placed orders (if any) at  3:15 PM on July 15, 2012.   Bryan Gilmore is a 61 y.o. male was transferred to the hospital from dialysis for thrombectomy. He was to be taken to Short Stay, but was mistakenly sent here. He has no other c/o. Left forearm AV fistula is pulseless. Left hand is sensate and has normal perfusion.    The patient appears stable so that the remainder of the MSE may be completed by another provider.   Pt is taken to Short Stay by the ED nurse.  Flint Melter, MD 07/15/12 [phone]

## 2012-07-15 NOTE — Procedures (Signed)
Procedure:  Dialysis graft declot  Findings:  Graft opened.  Critical venous anast stenosis treated with 6 mm PTA.

## 2012-07-16 ENCOUNTER — Telehealth (HOSPITAL_COMMUNITY): Payer: Self-pay | Admitting: *Deleted

## 2012-07-16 ENCOUNTER — Encounter (HOSPITAL_COMMUNITY): Payer: Self-pay | Admitting: Vascular Surgery

## 2012-07-19 DIAGNOSIS — R262 Difficulty in walking, not elsewhere classified: Secondary | ICD-10-CM | POA: Diagnosis not present

## 2012-07-19 DIAGNOSIS — G9340 Encephalopathy, unspecified: Secondary | ICD-10-CM | POA: Diagnosis not present

## 2012-07-22 ENCOUNTER — Other Ambulatory Visit: Payer: Self-pay | Admitting: Urology

## 2012-07-22 DIAGNOSIS — G9340 Encephalopathy, unspecified: Secondary | ICD-10-CM | POA: Diagnosis not present

## 2012-07-22 DIAGNOSIS — N35919 Unspecified urethral stricture, male, unspecified site: Secondary | ICD-10-CM | POA: Diagnosis not present

## 2012-07-22 DIAGNOSIS — R262 Difficulty in walking, not elsewhere classified: Secondary | ICD-10-CM | POA: Diagnosis not present

## 2012-07-22 DIAGNOSIS — N390 Urinary tract infection, site not specified: Secondary | ICD-10-CM | POA: Diagnosis not present

## 2012-07-23 ENCOUNTER — Encounter (HOSPITAL_COMMUNITY): Payer: Self-pay | Admitting: Pharmacy Technician

## 2012-07-24 ENCOUNTER — Encounter (HOSPITAL_COMMUNITY): Payer: Self-pay | Admitting: *Deleted

## 2012-07-24 DIAGNOSIS — G9340 Encephalopathy, unspecified: Secondary | ICD-10-CM | POA: Diagnosis not present

## 2012-07-24 DIAGNOSIS — R262 Difficulty in walking, not elsewhere classified: Secondary | ICD-10-CM | POA: Diagnosis not present

## 2012-07-25 ENCOUNTER — Encounter (HOSPITAL_COMMUNITY): Payer: Self-pay | Admitting: *Deleted

## 2012-07-25 NOTE — Progress Notes (Signed)
Spoke with erica patterson lpn at [facility] unable to tell this RN if pt has skin wound, active mrsa or peg tube in place.

## 2012-07-25 NOTE — Progress Notes (Signed)
Faxed pre op instructions and [place] preparing for surgery sheet to erica patterson lpn [facility], fax confirmation received and placed on pt chart

## 2012-07-26 DIAGNOSIS — G9340 Encephalopathy, unspecified: Secondary | ICD-10-CM | POA: Diagnosis not present

## 2012-07-26 DIAGNOSIS — R262 Difficulty in walking, not elsewhere classified: Secondary | ICD-10-CM | POA: Diagnosis not present

## 2012-07-29 MED ORDER — GENTAMICIN IN SALINE 1.6-0.9 MG/ML-% IV SOLN
80.0000 mg | INTRAVENOUS | Status: AC
  Administered 2012-07-30: 80 mg via INTRAVENOUS
  Filled 2012-07-29: qty 50

## 2012-07-29 MED ORDER — GENTAMICIN SULFATE 40 MG/ML IJ SOLN
440.0000 mg | INTRAVENOUS | Status: DC
  Filled 2012-07-29: qty 11

## 2012-07-29 NOTE — Anesthesia Preprocedure Evaluation (Addendum)
Anesthesia Evaluation  Patient identified by MRN, date of birth, ID band Patient awake and Patient confused    Reviewed: Allergy & Precautions, H&P , NPO status , Patient's Chart, lab work & pertinent test results, Unable to perform ROS - Chart review only  Airway Mallampati: III TM Distance: >3 FB Neck ROM: Full    Dental  (+) Missing, Chipped, Poor Dentition, Dental Advisory Given and Teeth Intact,    Pulmonary neg pulmonary ROS,  breath sounds clear to auscultation  Pulmonary exam normal       Cardiovascular hypertension, Pt. on medications and Pt. on home beta blockers + dysrhythmias Atrial Fibrillation Rhythm:Regular Rate:Normal     Neuro/Psych PSYCHIATRIC DISORDERS negative neurological ROS  negative psych ROS   GI/Hepatic negative GI ROS, GERD-  Medicated,(+)     substance abuse  alcohol use, Hepatitis -, C  Endo/Other  negative endocrine ROS  Renal/GU ARF, Renal Insufficiency and ESRFRenal disease  negative genitourinary   Musculoskeletal negative musculoskeletal ROS (+)   Abdominal   Peds  Hematology  (+) HIV,   Anesthesia Other Findings   Reproductive/Obstetrics negative OB ROS                           Anesthesia Physical Anesthesia Plan  ASA: III  Anesthesia Plan: General   Post-op Pain Management:    Induction: Intravenous  Airway Management Planned: LMA  Additional Equipment:   Intra-op Plan:   Post-operative Plan: Extubation in OR  Informed Consent: I have reviewed the patients History and Physical, chart, labs and discussed the procedure including the risks, benefits and alternatives for the proposed anesthesia with the patient or authorized representative who has indicated his/her understanding and acceptance.   Dental advisory given  Plan Discussed with: CRNA  Anesthesia Plan Comments:        Anesthesia Quick Evaluation

## 2012-07-29 NOTE — H&P (Signed)
History of Present Illness   Bryan Gilmore had Fournier's gangrene. It is likely initially a perirectal abscess or urethral perforation from the stricture. He has had the catheter changed by my partner. It now has been in for a few months. He is HIV positive.  He is in [facility]. He has had Hartmann procedure and colostomy due to soilage of his wound with incontinence and diarrhea. He has been on dialysis in the past. I remember taking him back the second time for debridement and I could see the Foley catheter. An 22 [place] Coude has been placed without difficulty in the past I believe by Dr. Mena Gilmore. A serum creatinine was 6.72 on his notes.   Review of Systems: No other change in bowel or neurologic systems.   Some of his medical comorbidities include malnutrition, chronic renal insufficiency, and anemia.    I reviewed some of his medical records and he had fever on August 2. One serum creatinine has been as low as 3.57. He still has dialysis 3 times a week. He has had encephalopathy but that is significantly improved. He seemed very clear today. He has a history of ethanol abuse and hepatitis C and he is MRSA positive.   There is no other modifying factors or associated signs or symptoms. There is no other aggravating or relieving factors. His symptoms are moderate in severity and persisting. He is having discharge that can be foul-smelling from the tip of the penis around the catheter and his urine today did not look very clear.  On physical examination, the perineum and scrotum has healed beautifully. There was no obvious fistula.    Past Medical History Problems  1. History of  End Stage Renal Disease 585.6 2. History of  Gout 274.9 3. History of  Hepatitis 573.3 4. History of  HIV Infection 042 5. History of  Hypertension 401.9  Surgical History Problems  1. History of  Colostomy 2. History of  Dialysis V56.8  Current Meds 1. Acetaminophen Non Aspirin TABS; Therapy:  (Recorded:11Nov2013) to 2. Bystolic 20 MG Oral Tablet; Therapy: (Recorded:11Nov2013) to 3. Isentress 400 MG Oral Tablet; Therapy: (Recorded:11Nov2013) to 4. Kaletra TABS; Therapy: (Recorded:11Nov2013) to 5. KlonoPIN 0.5 MG Oral Tablet; Therapy: (Recorded:11Nov2013) to 6. Multi Vitamin/Minerals TABS; Therapy: (Recorded:11Nov2013) to 7. PhosLo 667 MG TABS; Therapy: (Recorded:11Nov2013) to 8. Protonix PACK; Therapy: (Recorded:11Nov2013) to 9. Remeron 15 MG Oral Tablet; Therapy: (Recorded:11Nov2013) to 10. Retrovir TABS; Therapy: (Recorded:11Nov2013) to 11. SEROquel 100 MG Oral Tablet; Therapy: (Recorded:11Nov2013) to 12. SEROquel 50 MG Oral Tablet; Therapy: (Recorded:11Nov2013) to 13. Truvada 200-300 MG Oral Tablet; Therapy: (Recorded:11Nov2013) to 14. Zyloprim 300 MG Oral Tablet; Therapy: (Recorded:11Nov2013) to  Allergies Medication  1. No Known Drug Allergies  Social History Problems  1. Never A Smoker  Vitals Vital Signs [Data Includes: Last 1 Day]  11Nov2013 02:27PM  Blood Pressure: 124 / 84 Temperature: 97.4 F Heart Rate: 63  Assessment Assessed  1. 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 Urinary Tract Infection 599.0  Plan Urethral Stricture (598.9)  1. Follow-up Schedule Surgery Office  Follow-up  Requested for: 11Nov2013  Discussion/Summary   Bryan Gilmore in my opinion should have an examination under anesthesia for urethral stricture disease and urethrocutaneous fistula. I would examine his bladder and urethra and irrigate his bladder as necessary. I would stage his stricture. My goal is to try to get him catheter free. Unfortunately, he has a high risk of recurrence, but I do not think at this stage it warrants a suprapubic tube. I  do not want to take the catheter out and not be able to reinsert it, especially if he has a fistula. Pros and cons and risks were discussed. He consented to the procedure. This will be done as an outpatient. I covered him with broad-spectrum antibiotics  before and after the procedure. He would start ciprofloxacin 3 days prior.   I also discussed balloon dilation.  We talked about balloon dilation in detail. Pros, cons, general surgical and anesthetic risks, and other options including watchful waiting were discussed. He understands that dilation is generally successful in most cases but long-term success rates are low. We talked about the risk of persistent, de novo, or worsening incontinence and voiding dysfunction. Risks were described but not limited to the discussion of injury to neighboring structures and soft tissues. Bleeding, infection, pain, erectile dysfunction, and spraying of urination were discussed. The risk of neuropathy was discussed as well as the usual post-operative course.   Picture drawn. I recommended to follow up with general surgery regarding his colostomy.  I am going to send a copy of my note to Dr. Murray Gilmore and Dr. Delano Gilmore, Nephrology and Geriatrics.  After a thorough review of the management options for the patient's condition the patient  elected to proceed with surgical therapy as noted above. We have discussed the potential benefits and risks of the procedure, side effects of the proposed treatment, the likelihood of the patient achieving the goals of the procedure, and any potential problems that might occur during the procedure or recuperation. Informed consent has been obtained.

## 2012-07-30 ENCOUNTER — Encounter (HOSPITAL_COMMUNITY): Payer: Self-pay | Admitting: *Deleted

## 2012-07-30 ENCOUNTER — Ambulatory Visit (HOSPITAL_COMMUNITY)
Admission: RE | Admit: 2012-07-30 | Discharge: 2012-07-30 | Disposition: A | Discharge status: Rehabilitation Facility | Payer: Medicare Other | Source: Ambulatory Visit | Attending: Urology | Admitting: Urology

## 2012-07-30 ENCOUNTER — Encounter (HOSPITAL_COMMUNITY): Payer: Self-pay | Admitting: Anesthesiology

## 2012-07-30 ENCOUNTER — Encounter (HOSPITAL_COMMUNITY)
Admission: RE | Disposition: A | Discharge status: Rehabilitation Facility | Payer: Self-pay | Source: Ambulatory Visit | Attending: Urology

## 2012-07-30 ENCOUNTER — Ambulatory Visit (HOSPITAL_COMMUNITY): Payer: Medicare Other | Admitting: Anesthesiology

## 2012-07-30 DIAGNOSIS — S3730XA Unspecified injury of urethra, initial encounter: Secondary | ICD-10-CM | POA: Diagnosis not present

## 2012-07-30 DIAGNOSIS — S3720XA Unspecified injury of bladder, initial encounter: Secondary | ICD-10-CM | POA: Insufficient documentation

## 2012-07-30 DIAGNOSIS — Z21 Asymptomatic human immunodeficiency virus [HIV] infection status: Secondary | ICD-10-CM | POA: Insufficient documentation

## 2012-07-30 DIAGNOSIS — G9340 Encephalopathy, unspecified: Secondary | ICD-10-CM | POA: Diagnosis not present

## 2012-07-30 DIAGNOSIS — N135 Crossing vessel and stricture of ureter without hydronephrosis: Secondary | ICD-10-CM | POA: Diagnosis not present

## 2012-07-30 DIAGNOSIS — I120 Hypertensive chronic kidney disease with stage 5 chronic kidney disease or end stage renal disease: Secondary | ICD-10-CM | POA: Diagnosis not present

## 2012-07-30 DIAGNOSIS — X58XXXA Exposure to other specified factors, initial encounter: Secondary | ICD-10-CM | POA: Insufficient documentation

## 2012-07-30 DIAGNOSIS — N35919 Unspecified urethral stricture, male, unspecified site: Secondary | ICD-10-CM | POA: Insufficient documentation

## 2012-07-30 DIAGNOSIS — N186 End stage renal disease: Secondary | ICD-10-CM | POA: Diagnosis not present

## 2012-07-30 DIAGNOSIS — Z79899 Other long term (current) drug therapy: Secondary | ICD-10-CM | POA: Diagnosis not present

## 2012-07-30 DIAGNOSIS — R262 Difficulty in walking, not elsewhere classified: Secondary | ICD-10-CM | POA: Diagnosis not present

## 2012-07-30 HISTORY — PX: URETHROGRAM: SHX6163

## 2012-07-30 HISTORY — PX: CYSTOSCOPY: SHX5120

## 2012-07-30 LAB — COMPREHENSIVE METABOLIC PANEL
ALT: 44 U/L (ref 0–53)
Albumin: 3 g/dL — ABNORMAL LOW (ref 3.5–5.2)
Alkaline Phosphatase: 112 U/L (ref 39–117)
BUN: 30 mg/dL — ABNORMAL HIGH (ref 6–23)
Chloride: 93 mEq/L — ABNORMAL LOW (ref 96–112)
Potassium: 3.4 mEq/L — ABNORMAL LOW (ref 3.5–5.1)
Total Bilirubin: 1.3 mg/dL — ABNORMAL HIGH (ref 0.3–1.2)

## 2012-07-30 LAB — CBC
HCT: 33.5 % — ABNORMAL LOW (ref 39.0–52.0)
Hemoglobin: 10.9 g/dL — ABNORMAL LOW (ref 13.0–17.0)
RDW: 16 % — ABNORMAL HIGH (ref 11.5–15.5)
WBC: 6.9 10*3/uL (ref 4.0–10.5)

## 2012-07-30 LAB — SURGICAL PCR SCREEN: Staphylococcus aureus: POSITIVE — AB

## 2012-07-30 SURGERY — URETHROGRAM
Anesthesia: General | Site: Bladder | Wound class: Clean Contaminated

## 2012-07-30 MED ORDER — LACTATED RINGERS IV SOLN: INTRAVENOUS | Status: DC

## 2012-07-30 MED ORDER — ONDANSETRON HCL 4 MG/2ML IJ SOLN
INTRAMUSCULAR | Status: DC | PRN
  Administered 2012-07-30: 4 mg via INTRAVENOUS

## 2012-07-30 MED ORDER — MIDAZOLAM HCL 5 MG/5ML IJ SOLN
INTRAMUSCULAR | Status: DC | PRN
  Administered 2012-07-30: 1 mg via INTRAVENOUS

## 2012-07-30 MED ORDER — FENTANYL CITRATE 0.05 MG/ML IJ SOLN
INTRAMUSCULAR | Status: DC | PRN
  Administered 2012-07-30 (×2): 50 ug via INTRAVENOUS

## 2012-07-30 MED ORDER — CEFAZOLIN SODIUM-DEXTROSE 2-3 GM-% IV SOLR
2.0000 g | INTRAVENOUS | Status: AC
  Administered 2012-07-30: 2 g via INTRAVENOUS

## 2012-07-30 MED ORDER — LIDOCAINE HCL 1 % IJ SOLN
INTRAMUSCULAR | Status: DC | PRN
  Administered 2012-07-30: 40 mg via INTRADERMAL

## 2012-07-30 MED ORDER — EPHEDRINE SULFATE 50 MG/ML IJ SOLN
INTRAMUSCULAR | Status: DC | PRN
  Administered 2012-07-30: 5 mg via INTRAVENOUS

## 2012-07-30 MED ORDER — CEFAZOLIN SODIUM 1-5 GM-% IV SOLN
INTRAVENOUS | Status: AC
  Filled 2012-07-30: qty 50

## 2012-07-30 MED ORDER — HYDROMORPHONE HCL PF 1 MG/ML IJ SOLN
0.2500 mg | INTRAMUSCULAR | Status: DC | PRN
  Administered 2012-07-30 (×2): 0.5 mg via INTRAVENOUS

## 2012-07-30 MED ORDER — PROMETHAZINE HCL 25 MG/ML IJ SOLN: 6.2500 mg | INTRAMUSCULAR | Status: DC | PRN

## 2012-07-30 MED ORDER — SODIUM CHLORIDE 0.9 % IV SOLN
INTRAVENOUS | Status: DC | PRN
  Administered 2012-07-30: 07:00:00 via INTRAVENOUS

## 2012-07-30 MED ORDER — MUPIROCIN 2 % EX OINT
TOPICAL_OINTMENT | Freq: Two times a day (BID) | CUTANEOUS | Status: DC
  Filled 2012-07-30: qty 22

## 2012-07-30 MED ORDER — PROPOFOL 10 MG/ML IV BOLUS
INTRAVENOUS | Status: DC | PRN
  Administered 2012-07-30: 140 mg via INTRAVENOUS

## 2012-07-30 MED ORDER — IOHEXOL 300 MG/ML  SOLN
INTRAMUSCULAR | Status: AC
  Filled 2012-07-30: qty 1

## 2012-07-30 MED ORDER — HYDROMORPHONE HCL PF 1 MG/ML IJ SOLN
INTRAMUSCULAR | Status: AC
  Filled 2012-07-30: qty 1

## 2012-07-30 SURGICAL SUPPLY — 16 items
BAG URINE DRAINAGE (UROLOGICAL SUPPLIES) ×2 IMPLANT
BAG URO CATCHER STRL LF (DRAPE) ×4 IMPLANT
CATH FOLEY 2WAY SLVR  5CC 16FR (CATHETERS) ×1
CATH FOLEY 2WAY SLVR 5CC 16FR (CATHETERS) ×1 IMPLANT
CATH INTERMIT  6FR 70CM (CATHETERS) ×2 IMPLANT
CLOTH BEACON ORANGE TIMEOUT ST (SAFETY) ×4 IMPLANT
CYSTOGRAFIN 30% 250ML (MISCELLANEOUS) ×2 IMPLANT
DRAPE CAMERA CLOSED 9X96 (DRAPES) ×2 IMPLANT
GLOVE BIO SURGEON STRL SZ7.5 (GLOVE) ×4 IMPLANT
GLOVE BIOGEL M STRL SZ7.5 (GLOVE) ×4 IMPLANT
GOWN PREVENTION PLUS XLARGE (GOWN DISPOSABLE) ×4 IMPLANT
GOWN STRL REIN XL XLG (GOWN DISPOSABLE) ×4 IMPLANT
HOLDER FOLEY CATH W/STRAP (MISCELLANEOUS) ×4 IMPLANT
KIT BASIN OR (CUSTOM PROCEDURE TRAY) ×2 IMPLANT
PACK CYSTO (CUSTOM PROCEDURE TRAY) ×4 IMPLANT
TUBING CONNECTING 10 (TUBING) IMPLANT

## 2012-07-30 NOTE — Progress Notes (Signed)
Dr. Sherron Monday came by and spoke with pt before discharge.

## 2012-07-30 NOTE — Interval H&P Note (Signed)
History and Physical Interval Note:  07/30/2012 7:05 AM  Bryan Gilmore.  has presented today for surgery, with the diagnosis of URETHRAL STRICTURE  The various methods of treatment have been discussed with the patient and family. After consideration of risks, benefits and other options for treatment, the patient has consented to  Procedure(s) (LRB) with comments: CYSTOSCOPY WITH URETHRAL DILATATION (N/A) - CYSTO, BALLOON DILATION, RETROGRADE URETHROGRAM URETHROGRAM (N/A) as a surgical intervention .  The patient's history has been reviewed, patient examined, no change in status, stable for surgery.  I have reviewed the patient's chart and labs.  Questions were answered to the patient's satisfaction.     [name]

## 2012-07-30 NOTE — Transfer of Care (Signed)
Immediate Anesthesia Transfer of Care Note  Patient: Bryan Gilmore.  Procedure(s) Performed: Procedure(s) (LRB) with comments: URETHROGRAM (N/A) - cystogram CYSTOSCOPY (Bilateral) - retrograde  Patient Location: PACU  Anesthesia Type:General  Level of Consciousness: awake, alert  and oriented  Airway & Oxygen Therapy: Patient Spontanous Breathing and Patient connected to face mask oxygen  Post-op Assessment: Report given to PACU RN, Post -op Vital signs reviewed and stable and Patient moving all extremities  Post vital signs: Reviewed and stable  Complications: No apparent anesthesia complications

## 2012-07-30 NOTE — Anesthesia Procedure Notes (Signed)
Procedure Name: LMA Insertion Date/Time: 07/30/2012 7:46 AM Performed by: Durward Parcel A Pre-anesthesia Checklist: Patient identified and Timeout performed Patient Re-evaluated:Patient Re-evaluated prior to inductionOxygen Delivery Method: Circle system utilized and Simple face mask Intubation Type: IV induction Ventilation: Mask ventilation without difficulty LMA: LMA inserted and LMA with gastric port inserted LMA Size: 4.0 Number of attempts: 1 Dental Injury: Teeth and Oropharynx as per pre-operative assessment

## 2012-07-30 NOTE — Progress Notes (Signed)
Pt given instructions on how to continue the bactroban ointment for his positive PCR screen.  Pt verbalized understanding.  I also sent instructions on this with his paperwork back to [facility].

## 2012-07-30 NOTE — Progress Notes (Signed)
Pt discharged via wheelchair by [facility] transportation back to [facility].

## 2012-07-30 NOTE — Progress Notes (Signed)
Pt is noted to have a PIV 18G in Rt forearm infusing with LR.  PIV site clean dry and intact.  Tegaderm dressing noted.

## 2012-07-30 NOTE — Anesthesia Postprocedure Evaluation (Signed)
Anesthesia Post Note  Patient: Bryan Gilmore.  Procedure(s) Performed: Procedure(s) (LRB): URETHROGRAM (N/A) CYSTOSCOPY (Bilateral)  Anesthesia type: General  Patient location: PACU  Post pain: Pain level controlled  Post assessment: Post-op Vital signs reviewed  Last Vitals:  Filed Vitals:   07/30/12 0845  BP: 109/65  Pulse:   Temp:   Resp:     Post vital signs: Reviewed  Level of consciousness: sedated  Complications: No apparent anesthesia complications

## 2012-07-30 NOTE — Op Note (Signed)
Preoperative diagnosis: Urethral stricture and urethral perforation  Postop diagnosis: Urethral stricture and healed urethral perforation Surgery: Cystoscopy retrograde urethrogram and cystogram Surgeon: Dr. Lorin Picket Traylen Eckels  Mr. Bosher present with Fournier's gangrene likely from urethral perforation at distal stricture in the proximal bulbar urethra. He's had a Foley catheter for a number of months.  Preoperative antibiotics were given. He is on dialysis. Extra care was taken with leg positioning  17 [place] scope was utilized. The penile urethra was normal. The bulbar urethra was wide open. He looked like he had a false passage well-healed just distal to the membranous urethra approximately a centimeter in depth. He had mild bilobar enlargement of prostate. Throughout the case I cystoscoped him a few times it looked like his membranous urethra was wide open.  He had minimal cystitis on cystoscopy. Trigone was easily identified. His urine was very cloudy and I sent it for culture and irrigated as bladder.  I measured his capacity with a mild hydrodistention and was approximate 150 mL.  Retrograde urethrogram: In the AP lithotomy position I performed a retrograde urethrogram using a 5 French ureteral catheter. 2 x-rays were taken. His urethra was wide open. There is no extravasation and dye reach the bladder easily  A 16 French straight catheter was easily inserted twice in the bladder. I then did a gravity cystogram  Gravity cystogram: In the AP lithotomy position I did a gravity cystogram and his capacity was approximately 200 mL. His bladder was minimally trabeculated. He no extravasation. The reflux. Posterior films were also noted.  Catheter was left to straight drainage.  Of concern the Mr. Gasper may be incontinent from a small capacity bladder in combination with a poorly functioning membranous urethra. Of course the patient was asleep when this was viewed in the sphincter may work well  or his bladder neck may work well. I will take the catheter out after counseling him and give him a trial of voiding in the clinic. He is at risk of recurrent urethral stricture disease as well. The degree of spongiose fibrosis in the proximal bulbar urethra today was minimal.

## 2012-07-30 NOTE — Progress Notes (Signed)
PIV dc'd per protocol.  IV catheter intact.

## 2012-07-31 ENCOUNTER — Encounter (HOSPITAL_COMMUNITY): Payer: Self-pay | Admitting: Urology

## 2012-07-31 DIAGNOSIS — I10 Essential (primary) hypertension: Secondary | ICD-10-CM | POA: Diagnosis not present

## 2012-07-31 DIAGNOSIS — L97909 Non-pressure chronic ulcer of unspecified part of unspecified lower leg with unspecified severity: Secondary | ICD-10-CM | POA: Diagnosis not present

## 2012-07-31 DIAGNOSIS — I96 Gangrene, not elsewhere classified: Secondary | ICD-10-CM | POA: Diagnosis not present

## 2012-07-31 DIAGNOSIS — K5900 Constipation, unspecified: Secondary | ICD-10-CM | POA: Diagnosis not present

## 2012-07-31 DIAGNOSIS — B20 Human immunodeficiency virus [HIV] disease: Secondary | ICD-10-CM | POA: Diagnosis not present

## 2012-07-31 LAB — URINE CULTURE

## 2012-08-02 DIAGNOSIS — H109 Unspecified conjunctivitis: Secondary | ICD-10-CM | POA: Diagnosis not present

## 2012-08-02 DIAGNOSIS — L97909 Non-pressure chronic ulcer of unspecified part of unspecified lower leg with unspecified severity: Secondary | ICD-10-CM | POA: Diagnosis not present

## 2012-08-02 DIAGNOSIS — Z933 Colostomy status: Secondary | ICD-10-CM | POA: Diagnosis not present

## 2012-08-02 DIAGNOSIS — G9340 Encephalopathy, unspecified: Secondary | ICD-10-CM | POA: Diagnosis not present

## 2012-08-02 DIAGNOSIS — R262 Difficulty in walking, not elsewhere classified: Secondary | ICD-10-CM | POA: Diagnosis not present

## 2012-08-04 ENCOUNTER — Emergency Department (HOSPITAL_COMMUNITY)
Admission: EM | Admit: 2012-08-04 | Discharge: 2012-08-04 | Disposition: A | Discharge status: Home / Self Care | Payer: Medicare Other | Attending: Emergency Medicine | Admitting: Emergency Medicine

## 2012-08-04 ENCOUNTER — Inpatient Hospital Stay: Admit: 2012-08-04 | Payer: Self-pay | Admitting: Vascular Surgery

## 2012-08-04 ENCOUNTER — Emergency Department (HOSPITAL_COMMUNITY): Payer: Medicare Other | Admitting: Critical Care Medicine

## 2012-08-04 ENCOUNTER — Emergency Department (HOSPITAL_COMMUNITY): Payer: Medicare Other

## 2012-08-04 ENCOUNTER — Encounter (HOSPITAL_COMMUNITY): Payer: Self-pay | Admitting: Critical Care Medicine

## 2012-08-04 ENCOUNTER — Other Ambulatory Visit: Payer: Self-pay

## 2012-08-04 ENCOUNTER — Encounter (HOSPITAL_COMMUNITY)
Admission: EM | Disposition: A | Discharge status: Home / Self Care | Payer: Self-pay | Source: Home / Self Care | Attending: Emergency Medicine

## 2012-08-04 ENCOUNTER — Encounter (HOSPITAL_COMMUNITY): Payer: Self-pay | Admitting: Emergency Medicine

## 2012-08-04 DIAGNOSIS — Z01810 Encounter for preprocedural cardiovascular examination: Secondary | ICD-10-CM | POA: Diagnosis not present

## 2012-08-04 DIAGNOSIS — Z21 Asymptomatic human immunodeficiency virus [HIV] infection status: Secondary | ICD-10-CM | POA: Insufficient documentation

## 2012-08-04 DIAGNOSIS — N186 End stage renal disease: Secondary | ICD-10-CM | POA: Diagnosis not present

## 2012-08-04 DIAGNOSIS — B1920 Unspecified viral hepatitis C without hepatic coma: Secondary | ICD-10-CM | POA: Insufficient documentation

## 2012-08-04 DIAGNOSIS — I4891 Unspecified atrial fibrillation: Secondary | ICD-10-CM | POA: Diagnosis not present

## 2012-08-04 DIAGNOSIS — Y832 Surgical operation with anastomosis, bypass or graft as the cause of abnormal reaction of the patient, or of later complication, without mention of misadventure at the time of the procedure: Secondary | ICD-10-CM | POA: Insufficient documentation

## 2012-08-04 DIAGNOSIS — H33049 Retinal detachment with retinal dialysis, unspecified eye: Secondary | ICD-10-CM | POA: Diagnosis not present

## 2012-08-04 DIAGNOSIS — T82868A Thrombosis of vascular prosthetic devices, implants and grafts, initial encounter: Secondary | ICD-10-CM

## 2012-08-04 DIAGNOSIS — Z01818 Encounter for other preprocedural examination: Secondary | ICD-10-CM | POA: Diagnosis not present

## 2012-08-04 DIAGNOSIS — N189 Chronic kidney disease, unspecified: Secondary | ICD-10-CM | POA: Diagnosis not present

## 2012-08-04 DIAGNOSIS — Z992 Dependence on renal dialysis: Secondary | ICD-10-CM | POA: Insufficient documentation

## 2012-08-04 DIAGNOSIS — Z01812 Encounter for preprocedural laboratory examination: Secondary | ICD-10-CM | POA: Insufficient documentation

## 2012-08-04 DIAGNOSIS — A419 Sepsis, unspecified organism: Secondary | ICD-10-CM | POA: Diagnosis not present

## 2012-08-04 DIAGNOSIS — Z79899 Other long term (current) drug therapy: Secondary | ICD-10-CM | POA: Diagnosis not present

## 2012-08-04 DIAGNOSIS — T82898A Other specified complication of vascular prosthetic devices, implants and grafts, initial encounter: Secondary | ICD-10-CM | POA: Insufficient documentation

## 2012-08-04 DIAGNOSIS — K759 Inflammatory liver disease, unspecified: Secondary | ICD-10-CM | POA: Diagnosis not present

## 2012-08-04 DIAGNOSIS — R262 Difficulty in walking, not elsewhere classified: Secondary | ICD-10-CM | POA: Diagnosis not present

## 2012-08-04 DIAGNOSIS — M109 Gout, unspecified: Secondary | ICD-10-CM | POA: Insufficient documentation

## 2012-08-04 DIAGNOSIS — J9819 Other pulmonary collapse: Secondary | ICD-10-CM | POA: Diagnosis not present

## 2012-08-04 DIAGNOSIS — T8389XA Other specified complication of genitourinary prosthetic devices, implants and grafts, initial encounter: Secondary | ICD-10-CM | POA: Diagnosis not present

## 2012-08-04 DIAGNOSIS — I120 Hypertensive chronic kidney disease with stage 5 chronic kidney disease or end stage renal disease: Secondary | ICD-10-CM | POA: Diagnosis not present

## 2012-08-04 DIAGNOSIS — I10 Essential (primary) hypertension: Secondary | ICD-10-CM | POA: Diagnosis not present

## 2012-08-04 DIAGNOSIS — G9340 Encephalopathy, unspecified: Secondary | ICD-10-CM | POA: Diagnosis not present

## 2012-08-04 HISTORY — PX: INSERTION OF DIALYSIS CATHETER: SHX1324

## 2012-08-04 LAB — CBC WITH DIFFERENTIAL/PLATELET
Basophils Absolute: 0 K/uL (ref 0.0–0.1)
Basophils Relative: 0 % (ref 0–1)
Eosinophils Absolute: 0.1 K/uL (ref 0.0–0.7)
Eosinophils Relative: 2 % (ref 0–5)
HCT: 31.3 % — ABNORMAL LOW (ref 39.0–52.0)
Hemoglobin: 10.3 g/dL — ABNORMAL LOW (ref 13.0–17.0)
Lymphocytes Relative: 25 % (ref 12–46)
Lymphs Abs: 1.5 K/uL (ref 0.7–4.0)
MCH: 34.8 pg — ABNORMAL HIGH (ref 26.0–34.0)
MCHC: 32.9 g/dL (ref 30.0–36.0)
MCV: 105.7 fL — ABNORMAL HIGH (ref 78.0–100.0)
Monocytes Absolute: 0.6 K/uL (ref 0.1–1.0)
Monocytes Relative: 9 % (ref 3–12)
Neutro Abs: 3.8 K/uL (ref 1.7–7.7)
Neutrophils Relative %: 64 % (ref 43–77)
Platelets: 230 K/uL (ref 150–400)
RBC: 2.96 MIL/uL — ABNORMAL LOW (ref 4.22–5.81)
RDW: 15.7 % — ABNORMAL HIGH (ref 11.5–15.5)
WBC: 6 K/uL (ref 4.0–10.5)

## 2012-08-04 LAB — BASIC METABOLIC PANEL WITH GFR
BUN: 68 mg/dL — ABNORMAL HIGH (ref 6–23)
CO2: 27 meq/L (ref 19–32)
Calcium: 9.8 mg/dL (ref 8.4–10.5)
Chloride: 94 meq/L — ABNORMAL LOW (ref 96–112)
Creatinine, Ser: 6.02 mg/dL — ABNORMAL HIGH (ref 0.50–1.35)
GFR calc Af Amer: 10 mL/min — ABNORMAL LOW
GFR calc non Af Amer: 9 mL/min — ABNORMAL LOW
Glucose, Bld: 100 mg/dL — ABNORMAL HIGH (ref 70–99)
Potassium: 4.1 meq/L (ref 3.5–5.1)
Sodium: 135 meq/L (ref 135–145)

## 2012-08-04 SURGERY — INSERTION OF DIALYSIS CATHETER
Anesthesia: General | Site: Chest | Laterality: Right | Wound class: Clean

## 2012-08-04 MED ORDER — OXYCODONE HCL 5 MG/5ML PO SOLN: 5.0000 mg | Freq: Once | ORAL | Status: DC | PRN

## 2012-08-04 MED ORDER — OXYCODONE-ACETAMINOPHEN 5-325 MG PO TABS: 1.0000 | ORAL_TABLET | ORAL | Status: DC | PRN

## 2012-08-04 MED ORDER — FENTANYL CITRATE 0.05 MG/ML IJ SOLN
INTRAMUSCULAR | Status: DC | PRN
  Administered 2012-08-04 (×2): 25 ug via INTRAVENOUS

## 2012-08-04 MED ORDER — LIDOCAINE-EPINEPHRINE (PF) 1 %-1:200000 IJ SOLN
INTRAMUSCULAR | Status: AC
  Filled 2012-08-04: qty 10

## 2012-08-04 MED ORDER — HEPARIN SODIUM (PORCINE) 1000 UNIT/ML IJ SOLN
INTRAMUSCULAR | Status: DC | PRN
  Administered 2012-08-04: 4.6 mL via INTRAVENOUS

## 2012-08-04 MED ORDER — PROPOFOL 10 MG/ML IV BOLUS
INTRAVENOUS | Status: DC | PRN
  Administered 2012-08-04: 10 mg via INTRAVENOUS
  Administered 2012-08-04 (×2): 20 mg via INTRAVENOUS
  Administered 2012-08-04: 10 mg via INTRAVENOUS

## 2012-08-04 MED ORDER — 0.9 % SODIUM CHLORIDE (POUR BTL) OPTIME
TOPICAL | Status: DC | PRN
  Administered 2012-08-04: 1000 mL

## 2012-08-04 MED ORDER — SODIUM CHLORIDE 0.9 % IV SOLN
INTRAVENOUS | Status: DC | PRN
  Administered 2012-08-04: 07:00:00 via INTRAVENOUS

## 2012-08-04 MED ORDER — SODIUM CHLORIDE 0.9 % IR SOLN
Status: DC | PRN
  Administered 2012-08-04: 08:00:00

## 2012-08-04 MED ORDER — HYDROMORPHONE HCL PF 1 MG/ML IJ SOLN: 0.2500 mg | INTRAMUSCULAR | Status: DC | PRN

## 2012-08-04 MED ORDER — MIDAZOLAM HCL 5 MG/5ML IJ SOLN
INTRAMUSCULAR | Status: DC | PRN
  Administered 2012-08-04: 2 mg via INTRAVENOUS

## 2012-08-04 MED ORDER — LIDOCAINE HCL (CARDIAC) 20 MG/ML IV SOLN
INTRAVENOUS | Status: DC | PRN
  Administered 2012-08-04: 30 mg via INTRAVENOUS

## 2012-08-04 MED ORDER — LIDOCAINE-EPINEPHRINE (PF) 1 %-1:200000 IJ SOLN
INTRAMUSCULAR | Status: DC | PRN
  Administered 2012-08-04: 10 mL via INTRADERMAL

## 2012-08-04 MED ORDER — METOCLOPRAMIDE HCL 5 MG/ML IJ SOLN: 10.0000 mg | Freq: Once | INTRAMUSCULAR | Status: DC | PRN

## 2012-08-04 MED ORDER — CEFAZOLIN SODIUM 1-5 GM-% IV SOLN
INTRAVENOUS | Status: DC | PRN
  Administered 2012-08-04: 1 g via INTRAVENOUS

## 2012-08-04 MED ORDER — ONDANSETRON HCL 4 MG/2ML IJ SOLN
INTRAMUSCULAR | Status: DC | PRN
  Administered 2012-08-04: 4 mg via INTRAVENOUS

## 2012-08-04 MED ORDER — OXYCODONE HCL 5 MG PO TABS: 5.0000 mg | ORAL_TABLET | Freq: Once | ORAL | Status: DC | PRN

## 2012-08-04 MED ORDER — HEPARIN SODIUM (PORCINE) 1000 UNIT/ML IJ SOLN
INTRAMUSCULAR | Status: AC
  Filled 2012-08-04: qty 1

## 2012-08-04 SURGICAL SUPPLY — 60 items
ADH SKN CLS APL DERMABOND .7 (GAUZE/BANDAGES/DRESSINGS) ×2
BAG DECANTER FOR FLEXI CONT (MISCELLANEOUS) ×3 IMPLANT
CANISTER SUCTION 2500CC (MISCELLANEOUS) ×1 IMPLANT
CATH CANNON HEMO 15F 50CM (CATHETERS) IMPLANT
CATH CANNON HEMO 15FR 19 (HEMODIALYSIS SUPPLIES) ×2 IMPLANT
CATH CANNON HEMO 15FR 23CM (HEMODIALYSIS SUPPLIES) IMPLANT
CATH CANNON HEMO 15FR 31CM (HEMODIALYSIS SUPPLIES) IMPLANT
CATH CANNON HEMO 15FR 32 (HEMODIALYSIS SUPPLIES) IMPLANT
CATH CANNON HEMO 15FR 32CM (HEMODIALYSIS SUPPLIES) IMPLANT
CATH EMB 4FR 80CM (CATHETERS) ×1 IMPLANT
CHLORAPREP W/TINT 26ML (MISCELLANEOUS) ×3 IMPLANT
CLIP TI MEDIUM 6 (CLIP) ×1 IMPLANT
CLIP TI WIDE RED SMALL 6 (CLIP) ×1 IMPLANT
CLOTH BEACON ORANGE TIMEOUT ST (SAFETY) ×3 IMPLANT
COVER PROBE W GEL 5X96 (DRAPES) IMPLANT
COVER SURGICAL LIGHT HANDLE (MISCELLANEOUS) ×3 IMPLANT
DERMABOND ADVANCED (GAUZE/BANDAGES/DRESSINGS) ×1
DERMABOND ADVANCED .7 DNX12 (GAUZE/BANDAGES/DRESSINGS) ×2 IMPLANT
DRAPE C-ARM 42X72 X-RAY (DRAPES) ×3 IMPLANT
DRAPE CHEST BREAST 15X10 FENES (DRAPES) ×3 IMPLANT
DRAPE X-RAY CASS 24X20 (DRAPES) IMPLANT
ELECT REM PT RETURN 9FT ADLT (ELECTROSURGICAL)
ELECTRODE REM PT RTRN 9FT ADLT (ELECTROSURGICAL) ×1 IMPLANT
GAUZE SPONGE 2X2 8PLY STRL LF (GAUZE/BANDAGES/DRESSINGS) ×2 IMPLANT
GAUZE SPONGE 4X4 16PLY XRAY LF (GAUZE/BANDAGES/DRESSINGS) ×3 IMPLANT
GEL ULTRASOUND 20GR AQUASONIC (MISCELLANEOUS) IMPLANT
GLOVE BIO SURGEON STRL SZ7.5 (GLOVE) ×3 IMPLANT
GLOVE BIOGEL PI IND STRL 8 (GLOVE) ×2 IMPLANT
GLOVE BIOGEL PI INDICATOR 8 (GLOVE) ×1
GOWN STRL NON-REIN LRG LVL3 (GOWN DISPOSABLE) ×6 IMPLANT
KIT BASIN OR (CUSTOM PROCEDURE TRAY) ×3 IMPLANT
KIT ROOM TURNOVER OR (KITS) ×3 IMPLANT
NDL 18GX1X1/2 (RX/OR ONLY) (NEEDLE) ×1 IMPLANT
NDL HYPO 25GX1X1/2 BEV (NEEDLE) ×1 IMPLANT
NEEDLE 18GX1X1/2 (RX/OR ONLY) (NEEDLE) ×3 IMPLANT
NEEDLE 22X1 1/2 (OR ONLY) (NEEDLE) ×3 IMPLANT
NEEDLE HYPO 25GX1X1/2 BEV (NEEDLE) ×3 IMPLANT
NS IRRIG 1000ML POUR BTL (IV SOLUTION) ×3 IMPLANT
PACK CV ACCESS (CUSTOM PROCEDURE TRAY) ×3 IMPLANT
PACK SURGICAL SETUP 50X90 (CUSTOM PROCEDURE TRAY) ×3 IMPLANT
PAD ARMBOARD 7.5X6 YLW CONV (MISCELLANEOUS) ×6 IMPLANT
SET COLLECT BLD 21X3/4 12 (NEEDLE) IMPLANT
SPONGE GAUZE 2X2 STER 10/PKG (GAUZE/BANDAGES/DRESSINGS) ×1
SPONGE SURGIFOAM ABS GEL 100 (HEMOSTASIS) IMPLANT
STOPCOCK 4 WAY LG BORE MALE ST (IV SETS) IMPLANT
SUT ETHILON 3 0 PS 1 (SUTURE) ×3 IMPLANT
SUT PROLENE 6 0 BV (SUTURE) ×1 IMPLANT
SUT VIC AB 3-0 SH 27 (SUTURE)
SUT VIC AB 3-0 SH 27X BRD (SUTURE) ×1 IMPLANT
SUT VICRYL 4-0 PS2 18IN ABS (SUTURE) ×3 IMPLANT
SYR 20CC LL (SYRINGE) ×6 IMPLANT
SYR 30ML LL (SYRINGE) IMPLANT
SYR 5ML LL (SYRINGE) ×6 IMPLANT
SYR CONTROL 10ML LL (SYRINGE) ×3 IMPLANT
SYRINGE 10CC LL (SYRINGE) ×3 IMPLANT
TOWEL OR 17X24 6PK STRL BLUE (TOWEL DISPOSABLE) ×3 IMPLANT
TOWEL OR 17X26 10 PK STRL BLUE (TOWEL DISPOSABLE) ×3 IMPLANT
TUBING EXTENTION W/L.L. (IV SETS) IMPLANT
UNDERPAD 30X30 INCONTINENT (UNDERPADS AND DIAPERS) ×3 IMPLANT
WATER STERILE IRR 1000ML POUR (IV SOLUTION) ×3 IMPLANT

## 2012-08-04 NOTE — ED Provider Notes (Signed)
History     CSN: [id number]  Arrival date & time 08/04/12  Jeralyn Bennett   First MD Initiated Contact with Patient 08/04/12 [phone]      Chief Complaint  Patient presents with  . Pre-op Exam    (Consider location/radiation/quality/duration/timing/severity/associated sxs/prior treatment) HPI Comments: He arrives from [facility] for pre-surgical evaluation. He has an AV graft in the left arm that has become recurrently occluded and is scheduled to have surgical treatment by Dr. Edilia Bo today. The patient has no complaints of pain or weakness. There has been no fever, SOB.  The history is provided by the patient.    Past Medical History  Diagnosis Date  . Hypertension   . HIV (human immunodeficiency virus infection)   . Sepsis(995.91)   . Gout   . Hepatitis C   . Fournier's gangrene   . Anemia   . Severe protein-calorie malnutrition   . Alcohol abuse   . End stage renal disease     hemodialysis tues, thurs, sat  . Dysrhythmia     AFib, not a candidate for coumadin , dr [place] signed off on pt    Past Surgical History  Procedure Date  . Cystoscopy with urethral dilatation 02/17/2012    Procedure: CYSTOSCOPY WITH URETHRAL DILATATION;  Surgeon: Martina Sinner, MD;  Location: [facility] ORS;  Service: Urology;  Laterality: N/A;  retrograde urethragram, placement of foley catheter exam under anesthesia   . Insertion of dialysis catheter 02/20/2012    Procedure: INSERTION OF DIALYSIS CATHETER;  Surgeon: Chuck Hint, MD;  Location: [facility] OR;  Service: Vascular;  Laterality: N/A;  . Irrigation and debridement abscess 02/29/2012    Procedure: IRRIGATION AND DEBRIDEMENT ABSCESS;  Surgeon: Martina Sinner, MD;  Location: [facility] OR;  Service: Urology;  Laterality: N/A;  I&D of scrotum abcess  . Insertion of dialysis catheter 03/29/2012    Procedure: INSERTION OF DIALYSIS CATHETER;  Surgeon: Larina Earthly, MD;  Location: [facility] OR;  Service: Vascular;  Laterality: N/A;  Insertion tunneled dialysis  catheter right IJ  . Av fistula placement 04/12/2012    Procedure: INSERTION OF ARTERIOVENOUS (AV) GORE-TEX GRAFT ARM;  Surgeon: Larina Earthly, MD;  Location: [facility] OR;  Service: Vascular;  Laterality: Left;  insertion of 6x50 stretch goretex graft   . Thrombectomy and revision of arterioventous (av) goretex  graft 07/15/2012    Procedure: THROMBECTOMY AND REVISION OF ARTERIOVENTOUS (AV) GORETEX  GRAFT;  Surgeon: Sherren Kerns, MD;  Location: [facility] OR;  Service: Vascular;  Laterality: Left;  . Urethrogram 07/30/2012    Procedure: URETHROGRAM;  Surgeon: Martina Sinner, MD;  Location: [facility];  Service: Urology;  Laterality: N/A;  cystogram  . Cystoscopy 07/30/2012    Procedure: CYSTOSCOPY;  Surgeon: Martina Sinner, MD;  Location: [facility] ORS;  Service: Urology;  Laterality: Bilateral;  retrograde    Family History  Problem Relation Age of Onset  . Hypertension Mother   . Hypertension Father     History  Substance Use Topics  . Smoking status: Never Smoker   . Smokeless tobacco: Never Used  . Alcohol Use: No      Review of Systems  Constitutional: Negative for fever.  Respiratory: Negative for shortness of breath.   Cardiovascular: Negative for chest pain.  Gastrointestinal: Negative for nausea and vomiting.  Musculoskeletal: Negative for myalgias.       See HPI.  Skin: Negative for rash and wound.  Neurological: Negative for weakness and numbness.    Allergies  Review of  patient's allergies indicates no known allergies.  Home Medications   Current Outpatient Rx  Name  Route  Sig  Dispense  Refill  . ALLOPURINOL 300 MG PO TABS   Oral   Take 300 mg by mouth every morning.          Marland Kitchen CALCIUM ACETATE 667 MG PO CAPS   Oral   Take 1 capsule (667 mg total) by mouth 3 (three) times daily with meals.         Marland Kitchen CIPROFLOXACIN HCL 0.2 % OT SOLN   Both Ears   Place 0.2 mLs into both ears at bedtime. For five days         . DOCUSATE SODIUM 100 MG PO CAPS   Oral   Take 100  mg by mouth daily.         Marland Kitchen PRO-STAT 64 PO LIQD   Oral   Take 30 mLs by mouth 3 (three) times daily with meals.   900 mL      . GABAPENTIN 100 MG PO CAPS   Oral   Take 100 mg by mouth 3 (three) times daily.         Marland Kitchen LAMIVUDINE 100 MG PO TABS   Oral   Take 50 mg by mouth every morning.          Marland Kitchen LOPINAVIR-RITONAVIR 400-100 MG/5ML PO SOLN   Oral   Take 5 mLs (400 mg total) by mouth 2 (two) times daily with a meal.   160 mL   1   . ADULT MULTIVITAMIN W/MINERALS CH   Oral   Take 1 tablet by mouth at bedtime.          Marland Kitchen MUPIROCIN CALCIUM 2 % NA OINT   Nasal   Place 1 application into the nose 2 (two) times daily. Use one-half of tube in each nostril twice daily for five (5) days. After application, press sides of nose together and gently massage.         Marland Kitchen NEPRO/CARB STEADY PO LIQD   Oral   Take 237 mLs by mouth 3 (three) times daily with meals.          . OXYCODONE HCL 5 MG PO CAPS   Oral   Take 5 mg by mouth every 6 (six) hours as needed. For pain         . PANTOPRAZOLE SODIUM 40 MG PO TBEC   Oral   Take 40 mg by mouth daily.         Marland Kitchen PENTOXIFYLLINE ER 400 MG PO TBCR   Oral   Take 400 mg by mouth 3 (three) times daily with meals.         Marland Kitchen RALTEGRAVIR POTASSIUM 400 MG PO TABS   Oral   Take 400 mg by mouth 2 (two) times daily.          Marland Kitchen SEVELAMER CARBONATE 800 MG PO TABS   Oral   Take 800 mg by mouth 3 (three) times daily with meals.         . TRIAMCINOLONE ACETONIDE 0.1 % EX OINT   Topical   Apply 1 application topically daily.         Marland Kitchen ZIDOVUDINE 100 MG PO CAPS   Oral   Take 300 mg by mouth every morning.          . ACETAMINOPHEN ER 650 MG PO TBCR   Oral   Take 650 mg by mouth every 8 (eight) hours as needed.  For pain         . TRAMADOL HCL 50 MG PO TABS   Oral   Take 50 mg by mouth 2 (two) times daily as needed. For pain         . ZOLPIDEM TARTRATE 5 MG PO TABS   Oral   Take 5 mg by mouth at bedtime as needed.  For sleep           BP 136/85  Pulse 83  Temp 97.8 F (36.6 C) (Oral)  Resp 18  SpO2 97%  Physical Exam  Constitutional: He is oriented to person, place, and time. He appears well-developed and well-nourished.  Neck: Normal range of motion.  Cardiovascular:       Pulses 2+ distal left UE.  Pulmonary/Chest: Effort normal.  Musculoskeletal: Normal range of motion.       Left proximal forearm graft present without palpable thrill. No swelling. Non-tender.   Neurological: He is alert and oriented to person, place, and time.  Skin: Skin is warm and dry.  Psychiatric: He has a normal mood and affect.    ED Course  Procedures (including critical care time)  Labs Reviewed  CBC WITH DIFFERENTIAL - Abnormal; Notable for the following:    RBC 2.96 (*)     Hemoglobin 10.3 (*)     HCT 31.3 (*)     MCV 105.7 (*)     MCH 34.8 (*)     RDW 15.7 (*)     All other components within normal limits  BASIC METABOLIC PANEL   No results found.   No diagnosis found.  1. Clotted AV graft  MDM  Discussed with Dr. Edilia Bo who reports orders should have been transferred with patient. Orders given verbally. Patient will transfer to surgery.        Rodena Medin, PA-C 08/04/12 [phone]

## 2012-08-04 NOTE — ED Notes (Signed)
Pt comes from [facility] - was advised to check in in the Emergency Department today at 0600 for declot L FA AVG.  Will need to contact Dr. Edilia Bo for surgery.  Pt has no complaints at this time.

## 2012-08-04 NOTE — Transfer of Care (Signed)
Immediate Anesthesia Transfer of Care Note  Patient: Bryan Gilmore.  Procedure(s) Performed: Procedure(s) (LRB) with comments: INSERTION OF DIALYSIS CATHETER (Right) - right intrajugular hemodialysis catheter insertion  Patient Location: PACU  Anesthesia Type:MAC  Level of Consciousness: awake, alert  and oriented  Airway & Oxygen Therapy: Patient Spontanous Breathing  Post-op Assessment: Report given to PACU RN, Post -op Vital signs reviewed and stable and Patient moving all extremities X 4  Post vital signs: Reviewed and stable  Complications: No apparent anesthesia complications

## 2012-08-04 NOTE — Preoperative (Signed)
Beta Blockers   Reason not to administer Beta Blockers:Not Applicable 

## 2012-08-04 NOTE — Anesthesia Postprocedure Evaluation (Signed)
Anesthesia Post Note  Patient: Bryan Gilmore.  Procedure(s) Performed: Procedure(s) (LRB): INSERTION OF DIALYSIS CATHETER (Right)  Anesthesia type: MAC  Patient location: PACU  Post pain: Pain level controlled  Post assessment: Patient's Cardiovascular Status Stable  Last Vitals:  Filed Vitals:   08/04/12 0835  BP:   Pulse:   Temp: 36.4 C  Resp:     Post vital signs: Reviewed and stable  Level of consciousness: alert  Complications: No apparent anesthesia complications

## 2012-08-04 NOTE — Anesthesia Procedure Notes (Signed)
Procedure Name: MAC Date/Time: 08/04/2012 7:40 AM Performed by: Elon Alas Pre-anesthesia Checklist: Patient identified, Timeout performed, Emergency Drugs available, Suction available and Patient being monitored Patient Re-evaluated:Patient Re-evaluated prior to inductionOxygen Delivery Method: Simple face mask Placement Confirmation: positive ETCO2 and breath sounds checked- equal and bilateral Dental Injury: Teeth and Oropharynx as per pre-operative assessment

## 2012-08-04 NOTE — Anesthesia Preprocedure Evaluation (Addendum)
Anesthesia Evaluation  Patient identified by MRN, date of birth, ID band Patient awake    Reviewed: Allergy & Precautions, H&P , NPO status , Patient's Chart, lab work & pertinent test results  Airway Mallampati: I TM Distance: >3 FB Neck ROM: Full    Dental  (+) Dental Advisory Given and Poor Dentition   Pulmonary          Cardiovascular hypertension, + dysrhythmias Atrial Fibrillation     Neuro/Psych    GI/Hepatic (+) Hepatitis -, C  Endo/Other    Renal/GU ESRFRenal disease     Musculoskeletal   Abdominal   Peds  Hematology  (+) HIV,   Anesthesia Other Findings   Reproductive/Obstetrics                          Anesthesia Physical Anesthesia Plan  ASA: III  Anesthesia Plan: MAC   Post-op Pain Management:    Induction: Intravenous  Airway Management Planned: Simple Face Mask  Additional Equipment:   Intra-op Plan:   Post-operative Plan:   Informed Consent: I have reviewed the patients History and Physical, chart, labs and discussed the procedure including the risks, benefits and alternatives for the proposed anesthesia with the patient or authorized representative who has indicated his/her understanding and acceptance.   Dental advisory given  Plan Discussed with: Anesthesiologist, Surgeon and CRNA  Anesthesia Plan Comments: (See surgeon's H&P )      Anesthesia Quick Evaluation

## 2012-08-04 NOTE — H&P (Signed)
[facility] [place]  Patient name: Bryan J Laster Jr. MRN: [id number] DOB: 12/16/1950 Sex: male  REASON FOR CONSULT: clotted left forearm AV graft  HPI: Bryan J Cloe Jr. is a 61 y.o. male with end-stage renal disease who dialyzes on [address] on Tuesdays Thursdays and Saturdays. He presents with a clotted left forearm AV graft. The graft was originally placed on 8-13. This was a 6 mm PTFE graft from the brachial artery to brachial vein. This occluded 3 months later. He underwent thrombolyzes and was found to have a stenosis at the venous anastomosis which was ballooned with a 6 mm balloon. The graft occluded within 24 hours and he was taken to the operating room on 07/15/2012 for surgical thrombectomy and revision. It was noted that the patient had marked intimal hyperplasia at the venous anastomosis and the graft was extended higher up the brachial vein. The op report notes a 3 mm outflow vein. He presents now with his third graft occlusion. I think most likely this is related to a poor quality vein.   He denies shortness of breath. He has had no other uremic symptoms. Specifically he denies nausea, vomiting, fatigue, anorexia, or palpitations.  Past Medical History  Diagnosis Date  . Hypertension   . HIV (human immunodeficiency virus infection)   . Sepsis(995.91)   . Gout   . Hepatitis C   . Fournier's gangrene   . Anemia   . Severe protein-calorie malnutrition   . Alcohol abuse   . End stage renal disease     hemodialysis tues, thurs, sat  . Dysrhythmia     AFib, not a candidate for coumadin , dr jordan signed off on pt    Family History  Problem Relation Age of Onset  . Hypertension Mother   . Hypertension Father     SOCIAL HISTORY: History  Substance Use Topics  . Smoking status: Never Smoker   . Smokeless tobacco: Never Used  . Alcohol Use: No    No Known Allergies  No current facility-administered medications for this encounter.    Current Outpatient Prescriptions  Medication Sig Dispense Refill  . allopurinol (ZYLOPRIM) 300 MG tablet Take 300 mg by mouth every morning.       . calcium acetate (PHOSLO) 667 MG capsule Take 1 capsule (667 mg total) by mouth 3 (three) times daily with meals.      . Ciprofloxacin HCl 0.2 % otic solution Place 0.2 mLs into both ears at bedtime. For five days      . docusate sodium (COLACE) 100 MG capsule Take 100 mg by mouth daily.      . feeding supplement (PRO-STAT SUGAR FREE 64) LIQD Take 30 mLs by mouth 3 (three) times daily with meals.  900 mL    . gabapentin (NEURONTIN) 100 MG capsule Take 100 mg by mouth 3 (three) times daily.      . lamivudine (EPIVIR) 100 MG tablet Take 50 mg by mouth every morning.       . lopinavir-ritonavir (KALETRA) 400-100 MG/5ML solution Take 5 mLs (400 mg total) by mouth 2 (two) times daily with a meal.  160 mL  1  . Multiple Vitamin (MULTIVITAMIN WITH MINERALS) TABS Take 1 tablet by mouth at bedtime.       . mupirocin nasal ointment (BACTROBAN) 2 % Place 1 application into the nose 2 (two) times daily. Use one-half of tube in each nostril twice daily for five (5) days. After application, press sides of   nose together and gently massage.      . Nutritional Supplements (FEEDING SUPPLEMENT, NEPRO CARB STEADY,) LIQD Take 237 mLs by mouth 3 (three) times daily with meals.       . oxycodone (OXY-IR) 5 MG capsule Take 5 mg by mouth every 6 (six) hours as needed. For pain      . pantoprazole (PROTONIX) 40 MG tablet Take 40 mg by mouth daily.      . pentoxifylline (TRENTAL) 400 MG CR tablet Take 400 mg by mouth 3 (three) times daily with meals.      . raltegravir (ISENTRESS) 400 MG tablet Take 400 mg by mouth 2 (two) times daily.       . sevelamer (RENVELA) 800 MG tablet Take 800 mg by mouth 3 (three) times daily with meals.      . triamcinolone ointment (KENALOG) 0.1 % Apply 1 application topically daily.      . zidovudine (RETROVIR) 100 MG capsule Take 300 mg by  mouth every morning.       . acetaminophen (TYLENOL) 650 MG CR tablet Take 650 mg by mouth every 8 (eight) hours as needed. For pain      . traMADol (ULTRAM) 50 MG tablet Take 50 mg by mouth 2 (two) times daily as needed. For pain      . zolpidem (AMBIEN) 5 MG tablet Take 5 mg by mouth at bedtime as needed. For sleep        REVIEW OF SYSTEMS: [X ] denotes positive finding; [  ] denotes negative finding CARDIOVASCULAR:  [ ] chest pain   [ ] chest pressure   [ ] palpitations   [ ] orthopnea   [ ] dyspnea on exertion   [ ] claudication   [ ] rest pain   [ ] DVT   [ ] phlebitis PULMONARY:   [ ] productive cough   [ ] asthma   [ ] wheezing NEUROLOGIC:   [ ] weakness  [ ] paresthesias  [ ] aphasia  [ ] amaurosis  [ ] dizziness HEMATOLOGIC:   [ ] bleeding problems   [ ] clotting disorders MUSCULOSKELETAL:  [ ] joint pain   [ ] joint swelling [ ] leg swelling GASTROINTESTINAL: [ ]  blood in stool  [ ]  hematemesis GENITOURINARY:  [ ]  dysuria  [ ]  hematuria PSYCHIATRIC:  [ ] history of major depression INTEGUMENTARY:  [ ] rashes  [ ] ulcers CONSTITUTIONAL:  [ ] fever   [ ] chills  PHYSICAL EXAM: Filed Vitals:   08/04/12 0521  BP: 136/85  Pulse: 83  Temp: 97.8 F (36.6 C)  TempSrc: Oral  Resp: 18  SpO2: 97%   There is no height or weight on file to calculate BMI. GENERAL: The patient is a well-nourished male, in no acute distress. The vital signs are documented above. CARDIOVASCULAR: There is a regular rate and rhythm. PULMONARY: There is good air exchange bilaterally without wheezing or rales. ABDOMEN: Soft and non-tender with normal pitched bowel sounds.  MUSCULOSKELETAL: There are no major deformities or cyanosis. NEUROLOGIC: No focal weakness or paresthesias are detected. SKIN: There are no ulcers or rashes noted. PSYCHIATRIC: The patient has a normal affect.  DATA:  Lab Results  Component Value Date   WBC 6.0 08/04/2012   HGB 10.3* 08/04/2012   HCT 31.3* 08/04/2012   MCV  105.7* 08/04/2012   PLT 230 08/04/2012   Lab Results  Component Value Date   NA   135 08/04/2012   K 4.1 08/04/2012   CL 94* 08/04/2012   CO2 27 08/04/2012   Lab Results  Component Value Date   CREATININE 6.02* 08/04/2012   Lab Results  Component Value Date   INR 1.06 07/15/2012   INR 1.37 02/21/2012   INR 1.40 02/18/2012   I reviewed his venogram from 07/15/12. I did not see any specific problems with the graft or at the arterial anastomosis. The main issue appears to be the vein.  MEDICAL ISSUES: Given that this graft has clotted 3 times now and it was only placed in August I think the issue is a poor quality vein. Therefore I do not think thrombectomy of this graft is indicated. I have recommended placement of a tunneled dialysis catheter. We will arrange for elective lead placement of a new left upper arm AV graft.  Jabron Weese S [facility] [place] Beeper: [phone]   

## 2012-08-04 NOTE — Op Note (Signed)
08/04/2012  PREOP DIAGNOSIS: Chronic kidney disease  POSTOP DIAGNOSIS: Chronic kidney disease  PROCEDURE: Ultrasound guided placement of right IJ Diatek catheter (23 cm cuff to tip)  SURGEON: Di Kindle. Edilia Bo, MD, FACS  ASSIST: none  ANESTHESIA: local with sedation   EBL: minimal  FINDINGS: patent right IJ  INDICATIONS: this patient presents with a clotted left forearm graft which is clotted multiple times recently. It appears to be an issue with poor quality vein. He'll be scheduled for elective placement of a new left upper arm AV graft.  TECHNIQUE: The patient was taken to the operating room and sedated by anesthesia. The neck and upper chest were prepped and draped in the usual sterile fashion. After the skin was anesthetized with 1% lidocaine, and under ultrasound guidance, the right IJ was cannulated and a guidewire introduced into the superior vena cava under fluoroscopic control. The tract over the wire was dilated and then the dilator and peel-away sheath were passed over the wire and the wire and dilator removed. The catheter was passed through the peel-away sheath and positioned in the right atrium. The exit site for the catheter was selected and the skin anesthetized between the 2 areas. The catheter was then brought through the tunnel, cut to the appropriate length, and the distal ports were attached. Both ports withdrew easily, were then flushed with heparinized saline and filled with concentrated heparin. The catheter was secured at its exit site with a 3-0 nylon suture. The IJ cannulation site was closed with a 4-0 subcuticular stitch. A sterile dressing was applied. The patient tolerated the procedure well and was transferred to the recovery room in stable condition. All needle and sponge counts were correct.  Waverly Ferrari, MD, FACS [facility] [place]  DATE OF OPERATION: 08/04/2012 DATE OF DICTATION: 08/04/2012

## 2012-08-04 NOTE — ED Provider Notes (Signed)
Medical screening examination/treatment/procedure(s) were performed by non-physician practitioner and as supervising physician I was immediately available for consultation/collaboration.  Ethelda Chick, MD 08/04/12 [phone]

## 2012-08-05 DIAGNOSIS — N390 Urinary tract infection, site not specified: Secondary | ICD-10-CM | POA: Diagnosis not present

## 2012-08-05 DIAGNOSIS — G9340 Encephalopathy, unspecified: Secondary | ICD-10-CM | POA: Diagnosis not present

## 2012-08-05 DIAGNOSIS — N35919 Unspecified urethral stricture, male, unspecified site: Secondary | ICD-10-CM | POA: Diagnosis not present

## 2012-08-05 DIAGNOSIS — R262 Difficulty in walking, not elsewhere classified: Secondary | ICD-10-CM | POA: Diagnosis not present

## 2012-08-06 ENCOUNTER — Encounter (HOSPITAL_COMMUNITY): Payer: Self-pay | Admitting: Vascular Surgery

## 2012-08-07 ENCOUNTER — Encounter (HOSPITAL_COMMUNITY): Payer: Self-pay | Admitting: Pharmacy Technician

## 2012-08-07 ENCOUNTER — Encounter: Payer: Self-pay | Admitting: *Deleted

## 2012-08-07 ENCOUNTER — Other Ambulatory Visit: Payer: Self-pay | Admitting: *Deleted

## 2012-08-07 DIAGNOSIS — G9340 Encephalopathy, unspecified: Secondary | ICD-10-CM | POA: Diagnosis not present

## 2012-08-07 DIAGNOSIS — R262 Difficulty in walking, not elsewhere classified: Secondary | ICD-10-CM | POA: Diagnosis not present

## 2012-08-12 ENCOUNTER — Encounter (HOSPITAL_COMMUNITY): Payer: Self-pay | Admitting: *Deleted

## 2012-08-12 DIAGNOSIS — R262 Difficulty in walking, not elsewhere classified: Secondary | ICD-10-CM | POA: Diagnosis not present

## 2012-08-12 DIAGNOSIS — G9340 Encephalopathy, unspecified: Secondary | ICD-10-CM | POA: Diagnosis not present

## 2012-08-13 ENCOUNTER — Encounter (HOSPITAL_COMMUNITY): Payer: Self-pay

## 2012-08-13 MED ORDER — DEXTROSE 5 % IV SOLN
1.5000 g | INTRAVENOUS | Status: AC
  Administered 2012-08-14: 1.5 g via INTRAVENOUS
  Filled 2012-08-13: qty 1.5

## 2012-08-14 ENCOUNTER — Ambulatory Visit (HOSPITAL_COMMUNITY): Payer: Medicare Other | Admitting: Anesthesiology

## 2012-08-14 ENCOUNTER — Encounter (HOSPITAL_COMMUNITY)
Admission: RE | Disposition: A | Discharge status: Skilled Nursing Facility | Payer: Self-pay | Source: Ambulatory Visit | Attending: Vascular Surgery

## 2012-08-14 ENCOUNTER — Ambulatory Visit (HOSPITAL_COMMUNITY)
Admission: RE | Admit: 2012-08-14 | Discharge: 2012-08-14 | Disposition: A | Discharge status: Skilled Nursing Facility | Payer: Medicare Other | Source: Ambulatory Visit | Attending: Vascular Surgery | Admitting: Vascular Surgery

## 2012-08-14 ENCOUNTER — Encounter (HOSPITAL_COMMUNITY): Payer: Self-pay | Admitting: *Deleted

## 2012-08-14 ENCOUNTER — Encounter (HOSPITAL_COMMUNITY): Payer: Self-pay | Admitting: Anesthesiology

## 2012-08-14 DIAGNOSIS — K759 Inflammatory liver disease, unspecified: Secondary | ICD-10-CM | POA: Diagnosis not present

## 2012-08-14 DIAGNOSIS — Z21 Asymptomatic human immunodeficiency virus [HIV] infection status: Secondary | ICD-10-CM | POA: Insufficient documentation

## 2012-08-14 DIAGNOSIS — Z992 Dependence on renal dialysis: Secondary | ICD-10-CM | POA: Insufficient documentation

## 2012-08-14 DIAGNOSIS — I120 Hypertensive chronic kidney disease with stage 5 chronic kidney disease or end stage renal disease: Secondary | ICD-10-CM | POA: Insufficient documentation

## 2012-08-14 DIAGNOSIS — B1920 Unspecified viral hepatitis C without hepatic coma: Secondary | ICD-10-CM | POA: Insufficient documentation

## 2012-08-14 DIAGNOSIS — I4891 Unspecified atrial fibrillation: Secondary | ICD-10-CM | POA: Insufficient documentation

## 2012-08-14 DIAGNOSIS — M109 Gout, unspecified: Secondary | ICD-10-CM | POA: Insufficient documentation

## 2012-08-14 DIAGNOSIS — Z8249 Family history of ischemic heart disease and other diseases of the circulatory system: Secondary | ICD-10-CM | POA: Diagnosis not present

## 2012-08-14 DIAGNOSIS — I10 Essential (primary) hypertension: Secondary | ICD-10-CM | POA: Diagnosis not present

## 2012-08-14 DIAGNOSIS — N186 End stage renal disease: Secondary | ICD-10-CM

## 2012-08-14 DIAGNOSIS — A419 Sepsis, unspecified organism: Secondary | ICD-10-CM | POA: Diagnosis not present

## 2012-08-14 HISTORY — PX: AV FISTULA PLACEMENT: SHX1204

## 2012-08-14 LAB — POCT I-STAT 4, (NA,K, GLUC, HGB,HCT)
Glucose, Bld: 104 mg/dL — ABNORMAL HIGH (ref 70–99)
HCT: 37 % — ABNORMAL LOW (ref 39.0–52.0)
Potassium: 3.7 mEq/L (ref 3.5–5.1)
Sodium: 142 mEq/L (ref 135–145)

## 2012-08-14 LAB — PROTIME-INR
INR: 1 (ref 0.00–1.49)
Prothrombin Time: 13.1 seconds (ref 11.6–15.2)

## 2012-08-14 LAB — SURGICAL PCR SCREEN: Staphylococcus aureus: NEGATIVE

## 2012-08-14 SURGERY — INSERTION OF ARTERIOVENOUS (AV) GORE-TEX GRAFT ARM
Anesthesia: Monitor Anesthesia Care | Site: Arm Upper | Laterality: Left | Wound class: Clean

## 2012-08-14 MED ORDER — ONDANSETRON HCL 4 MG/2ML IJ SOLN
INTRAMUSCULAR | Status: DC | PRN
  Administered 2012-08-14: 4 mg via INTRAVENOUS

## 2012-08-14 MED ORDER — MUPIROCIN 2 % EX OINT
TOPICAL_OINTMENT | Freq: Two times a day (BID) | CUTANEOUS | Status: DC
  Administered 2012-08-14: 1 via NASAL
  Filled 2012-08-14 (×2): qty 22

## 2012-08-14 MED ORDER — SODIUM CHLORIDE 0.9 % IV SOLN
INTRAVENOUS | Status: DC
  Administered 2012-08-14: 10 mL/h via INTRAVENOUS

## 2012-08-14 MED ORDER — MIDAZOLAM HCL 5 MG/5ML IJ SOLN
INTRAMUSCULAR | Status: DC | PRN
  Administered 2012-08-14 (×2): 1 mg via INTRAVENOUS

## 2012-08-14 MED ORDER — 0.9 % SODIUM CHLORIDE (POUR BTL) OPTIME
TOPICAL | Status: DC | PRN
  Administered 2012-08-14: 1000 mL

## 2012-08-14 MED ORDER — LIDOCAINE-EPINEPHRINE (PF) 1 %-1:200000 IJ SOLN
INTRAMUSCULAR | Status: DC | PRN
  Administered 2012-08-14: 30 mL

## 2012-08-14 MED ORDER — LIDOCAINE-EPINEPHRINE (PF) 1 %-1:200000 IJ SOLN
INTRAMUSCULAR | Status: AC
  Filled 2012-08-14: qty 10

## 2012-08-14 MED ORDER — PROPOFOL INFUSION 10 MG/ML OPTIME
INTRAVENOUS | Status: DC | PRN
  Administered 2012-08-14: 100 ug/kg/min via INTRAVENOUS

## 2012-08-14 MED ORDER — LIDOCAINE HCL (PF) 1 % IJ SOLN
INTRAMUSCULAR | Status: DC | PRN
  Administered 2012-08-14: 30 mL

## 2012-08-14 MED ORDER — SODIUM CHLORIDE 0.9 % IV SOLN
INTRAVENOUS | Status: DC | PRN
  Administered 2012-08-14: 11:00:00 via INTRAVENOUS

## 2012-08-14 MED ORDER — FENTANYL CITRATE 0.05 MG/ML IJ SOLN
INTRAMUSCULAR | Status: DC | PRN
  Administered 2012-08-14 (×2): 25 ug via INTRAVENOUS

## 2012-08-14 MED ORDER — LIDOCAINE HCL (CARDIAC) 20 MG/ML IV SOLN
INTRAVENOUS | Status: DC | PRN
  Administered 2012-08-14: 50 mg via INTRAVENOUS

## 2012-08-14 MED ORDER — LIDOCAINE HCL (PF) 1 % IJ SOLN
INTRAMUSCULAR | Status: AC
  Filled 2012-08-14: qty 30

## 2012-08-14 MED ORDER — SODIUM CHLORIDE 0.9 % IR SOLN
Status: DC | PRN
  Administered 2012-08-14: 11:00:00

## 2012-08-14 MED ORDER — OXYCODONE HCL 5 MG PO TABS: 5.0000 mg | ORAL_TABLET | ORAL | Status: DC | PRN

## 2012-08-14 MED ORDER — PHENYLEPHRINE HCL 10 MG/ML IJ SOLN
INTRAMUSCULAR | Status: DC | PRN
  Administered 2012-08-14 (×3): 40 ug via INTRAVENOUS

## 2012-08-14 MED ORDER — MUPIROCIN 2 % EX OINT
TOPICAL_OINTMENT | CUTANEOUS | Status: AC
  Administered 2012-08-14: 1 via NASAL
  Filled 2012-08-14: qty 22

## 2012-08-14 SURGICAL SUPPLY — 44 items
ADH SKN CLS APL DERMABOND .7 (GAUZE/BANDAGES/DRESSINGS) ×1
BLADE SURG 10 STRL SS (BLADE) ×1 IMPLANT
CANISTER SUCTION 2500CC (MISCELLANEOUS) ×2 IMPLANT
CATH EMB 3FR 40CM (CATHETERS) ×1 IMPLANT
CLIP TI MEDIUM 6 (CLIP) ×2 IMPLANT
CLIP TI WIDE RED SMALL 6 (CLIP) ×2 IMPLANT
CLOTH BEACON ORANGE TIMEOUT ST (SAFETY) ×2 IMPLANT
COVER SURGICAL LIGHT HANDLE (MISCELLANEOUS) ×2 IMPLANT
DECANTER SPIKE VIAL GLASS SM (MISCELLANEOUS) ×1 IMPLANT
DERMABOND ADVANCED (GAUZE/BANDAGES/DRESSINGS) ×1
DERMABOND ADVANCED .7 DNX12 (GAUZE/BANDAGES/DRESSINGS) ×1 IMPLANT
ELECT REM PT RETURN 9FT ADLT (ELECTROSURGICAL) ×2
ELECTRODE REM PT RTRN 9FT ADLT (ELECTROSURGICAL) ×1 IMPLANT
GEL ULTRASOUND 20GR AQUASONIC (MISCELLANEOUS) IMPLANT
GLOVE BIOGEL PI IND STRL 6.5 (GLOVE) IMPLANT
GLOVE BIOGEL PI IND STRL 7.0 (GLOVE) IMPLANT
GLOVE BIOGEL PI IND STRL 7.5 (GLOVE) IMPLANT
GLOVE BIOGEL PI INDICATOR 6.5 (GLOVE) ×1
GLOVE BIOGEL PI INDICATOR 7.0 (GLOVE) ×2
GLOVE BIOGEL PI INDICATOR 7.5 (GLOVE) ×1
GLOVE ECLIPSE 7.0 STRL STRAW (GLOVE) ×2 IMPLANT
GLOVE ECLIPSE 7.5 STRL STRAW (GLOVE) ×1 IMPLANT
GLOVE SS BIOGEL STRL SZ 7 (GLOVE) ×1 IMPLANT
GLOVE SUPERSENSE BIOGEL SZ 7 (GLOVE) ×2
GOWN PREVENTION PLUS XXLARGE (GOWN DISPOSABLE) ×1 IMPLANT
GOWN STRL NON-REIN LRG LVL3 (GOWN DISPOSABLE) ×6 IMPLANT
GRAFT GORETEX STND 6X20 (Vascular Products) ×2 IMPLANT
GRAFT GORETEXSTD 6X20 (Vascular Products) IMPLANT
KIT BASIN OR (CUSTOM PROCEDURE TRAY) ×2 IMPLANT
KIT ROOM TURNOVER OR (KITS) ×2 IMPLANT
NEEDLE 22X1 1/2 (OR ONLY) (NEEDLE) ×1 IMPLANT
NS IRRIG 1000ML POUR BTL (IV SOLUTION) ×2 IMPLANT
PACK CV ACCESS (CUSTOM PROCEDURE TRAY) ×2 IMPLANT
PAD ARMBOARD 7.5X6 YLW CONV (MISCELLANEOUS) ×4 IMPLANT
SPONGE GAUZE 4X4 12PLY (GAUZE/BANDAGES/DRESSINGS) ×2 IMPLANT
SUT PROLENE 6 0 BV (SUTURE) ×5 IMPLANT
SUT VIC AB 3-0 SH 27 (SUTURE) ×4
SUT VIC AB 3-0 SH 27X BRD (SUTURE) ×2 IMPLANT
SYR CONTROL 10ML LL (SYRINGE) ×1 IMPLANT
SYR TB 1ML LUER SLIP (SYRINGE) ×1 IMPLANT
TOWEL OR 17X24 6PK STRL BLUE (TOWEL DISPOSABLE) ×3 IMPLANT
TOWEL OR 17X26 10 PK STRL BLUE (TOWEL DISPOSABLE) ×2 IMPLANT
UNDERPAD 30X30 INCONTINENT (UNDERPADS AND DIAPERS) ×2 IMPLANT
WATER STERILE IRR 1000ML POUR (IV SOLUTION) ×2 IMPLANT

## 2012-08-14 NOTE — Anesthesia Postprocedure Evaluation (Signed)
  Anesthesia Post-op Note  Patient: Bryan Gilmore.  Procedure(s) Performed: Procedure(s) (LRB) with comments: INSERTION OF ARTERIOVENOUS (AV) GORE-TEX GRAFT ARM (Left)  Patient Location: PACU  Anesthesia Type:General  Level of Consciousness: awake, alert  and oriented  Airway and Oxygen Therapy: Patient Spontanous Breathing  Post-op Pain: mild  Post-op Assessment: Post-op Vital signs reviewed and Patient's Cardiovascular Status Stable  Post-op Vital Signs: stable  Complications: No apparent anesthesia complications

## 2012-08-14 NOTE — Anesthesia Preprocedure Evaluation (Addendum)
Anesthesia Evaluation  Patient identified by MRN, date of birth, ID band Patient awake    Reviewed: Allergy & Precautions, H&P , NPO status , Patient's Chart, lab work & pertinent test results  History of Anesthesia Complications Negative for: history of anesthetic complications  Airway Mallampati: II TM Distance: >3 FB Neck ROM: Full    Dental  (+) Dental Advisory Given and Poor Dentition   Pulmonary neg pulmonary ROS,  breath sounds clear to auscultation        Cardiovascular hypertension, + dysrhythmias Atrial Fibrillation Rhythm:Regular Rate:Normal     Neuro/Psych negative neurological ROS  negative psych ROS   GI/Hepatic (+) Hepatitis -, C  Endo/Other  negative endocrine ROS  Renal/GU ESRF and DialysisRenal disease     Musculoskeletal   Abdominal   Peds  Hematology  (+) HIV,   Anesthesia Other Findings   Reproductive/Obstetrics                          Anesthesia Physical Anesthesia Plan  ASA: III  Anesthesia Plan: MAC   Post-op Pain Management:    Induction: Intravenous  Airway Management Planned: Natural Airway  Additional Equipment: None  Intra-op Plan:   Post-operative Plan:   Informed Consent: I have reviewed the patients History and Physical, chart, labs and discussed the procedure including the risks, benefits and alternatives for the proposed anesthesia with the patient or authorized representative who has indicated his/her understanding and acceptance.   Dental advisory given  Plan Discussed with: Anesthesiologist, Surgeon and CRNA  Anesthesia Plan Comments:        Anesthesia Quick Evaluation

## 2012-08-14 NOTE — Progress Notes (Signed)
Spoke with charles renal pa notifd of pts surgery location and k=3.7 will instruct pt to return to dialysis tomorrow at regular time

## 2012-08-14 NOTE — Transfer of Care (Signed)
Immediate Anesthesia Transfer of Care Note  Patient: Bryan Gilmore.  Procedure(s) Performed: Procedure(s) (LRB) with comments: INSERTION OF ARTERIOVENOUS (AV) GORE-TEX GRAFT ARM (Left)  Patient Location: PACU  Anesthesia Type:MAC  Level of Consciousness: awake, alert  and oriented  Airway & Oxygen Therapy: Patient Spontanous Breathing and Patient connected to nasal cannula oxygen  Post-op Assessment: Report given to PACU RN, Post -op Vital signs reviewed and stable, Patient moving all extremities and Patient moving all extremities X 4  Post vital signs: Reviewed and stable  Complications: No apparent anesthesia complications

## 2012-08-14 NOTE — Anesthesia Procedure Notes (Addendum)
Procedure Name: MAC Date/Time: 08/14/2012 11:35 AM Performed by: Gayla Medicus Pre-anesthesia Checklist: Patient identified, Timeout performed, Emergency Drugs available, Suction available and Patient being monitored Patient Re-evaluated:Patient Re-evaluated prior to inductionOxygen Delivery Method: Nasal cannula Preoxygenation: Pre-oxygenation with 100% oxygen Dental Injury: Teeth and Oropharynx as per pre-operative assessment

## 2012-08-14 NOTE — H&P (View-Only) (Signed)
[facility]ist of [place]  Patient name: Bryan Gilmore. MRN: [id number] DOB: 1951/09/03 Sex: male  REASON FOR CONSULT: clotted left forearm AV graft  HPI: Bryan Gilmore. is a 61 y.o. male with end-stage renal disease who dialyzes on [address] on Tuesdays Thursdays and Saturdays. He presents with a clotted left forearm AV graft. The graft was originally placed on 8-13. This was a 6 mm PTFE graft from the brachial artery to brachial vein. This occluded 3 months later. He underwent thrombolyzes and was found to have a stenosis at the venous anastomosis which was ballooned with a 6 mm balloon. The graft occluded within 24 hours and he was taken to the operating room on 07/15/2012 for surgical thrombectomy and revision. It was noted that the patient had marked intimal hyperplasia at the venous anastomosis and the graft was extended higher up the brachial vein. The op report notes a 3 mm outflow vein. He presents now with his third graft occlusion. I think most likely this is related to a poor quality vein.   He denies shortness of breath. He has had no other uremic symptoms. Specifically he denies nausea, vomiting, fatigue, anorexia, or palpitations.  Past Medical History  Diagnosis Date  . Hypertension   . HIV (human immunodeficiency virus infection)   . Sepsis(995.91)   . Gout   . Hepatitis C   . Fournier's gangrene   . Anemia   . Severe protein-calorie malnutrition   . Alcohol abuse   . End stage renal disease     hemodialysis tues, thurs, sat  . Dysrhythmia     AFib, not a candidate for coumadin , dr [place] signed off on pt    Family History  Problem Relation Age of Onset  . Hypertension Mother   . Hypertension Father     SOCIAL HISTORY: History  Substance Use Topics  . Smoking status: Never Smoker   . Smokeless tobacco: Never Used  . Alcohol Use: No    No Known Allergies  No current facility-administered medications for this encounter.    Current Outpatient Prescriptions  Medication Sig Dispense Refill  . allopurinol (ZYLOPRIM) 300 MG tablet Take 300 mg by mouth every morning.       . calcium acetate (PHOSLO) 667 MG capsule Take 1 capsule (667 mg total) by mouth 3 (three) times daily with meals.      . Ciprofloxacin HCl 0.2 % otic solution Place 0.2 mLs into both ears at bedtime. For five days      . docusate sodium (COLACE) 100 MG capsule Take 100 mg by mouth daily.      . feeding supplement (PRO-STAT SUGAR FREE 64) LIQD Take 30 mLs by mouth 3 (three) times daily with meals.  900 mL    . gabapentin (NEURONTIN) 100 MG capsule Take 100 mg by mouth 3 (three) times daily.      Marland Kitchen lamivudine (EPIVIR) 100 MG tablet Take 50 mg by mouth every morning.       . lopinavir-ritonavir (KALETRA) 400-100 MG/5ML solution Take 5 mLs (400 mg total) by mouth 2 (two) times daily with a meal.  160 mL  1  . Multiple Vitamin (MULTIVITAMIN WITH MINERALS) TABS Take 1 tablet by mouth at bedtime.       . mupirocin nasal ointment (BACTROBAN) 2 % Place 1 application into the nose 2 (two) times daily. Use one-half of tube in each nostril twice daily for five (5) days. After application, press sides of  nose together and gently massage.      . Nutritional Supplements (FEEDING SUPPLEMENT, NEPRO CARB STEADY,) LIQD Take 237 mLs by mouth 3 (three) times daily with meals.       Marland Kitchen oxycodone (OXY-IR) 5 MG capsule Take 5 mg by mouth every 6 (six) hours as needed. For pain      . pantoprazole (PROTONIX) 40 MG tablet Take 40 mg by mouth daily.      . pentoxifylline (TRENTAL) 400 MG CR tablet Take 400 mg by mouth 3 (three) times daily with meals.      . raltegravir (ISENTRESS) 400 MG tablet Take 400 mg by mouth 2 (two) times daily.       . sevelamer (RENVELA) 800 MG tablet Take 800 mg by mouth 3 (three) times daily with meals.      . triamcinolone ointment (KENALOG) 0.1 % Apply 1 application topically daily.      . zidovudine (RETROVIR) 100 MG capsule Take 300 mg by  mouth every morning.       Marland Kitchen acetaminophen (TYLENOL) 650 MG CR tablet Take 650 mg by mouth every 8 (eight) hours as needed. For pain      . traMADol (ULTRAM) 50 MG tablet Take 50 mg by mouth 2 (two) times daily as needed. For pain      . zolpidem (AMBIEN) 5 MG tablet Take 5 mg by mouth at bedtime as needed. For sleep        REVIEW OF SYSTEMS: Arly.Keller ] denotes positive finding; [  ] denotes negative finding CARDIOVASCULAR:  [ ]  chest pain   [ ]  chest pressure   [ ]  palpitations   [ ]  orthopnea   [ ]  dyspnea on exertion   [ ]  claudication   [ ]  rest pain   [ ]  DVT   [ ]  phlebitis PULMONARY:   [ ]  productive cough   [ ]  asthma   [ ]  wheezing NEUROLOGIC:   [ ]  weakness  [ ]  paresthesias  [ ]  aphasia  [ ]  amaurosis  [ ]  dizziness HEMATOLOGIC:   [ ]  bleeding problems   [ ]  clotting disorders MUSCULOSKELETAL:  [ ]  joint pain   [ ]  joint swelling [ ]  leg swelling GASTROINTESTINAL: [ ]   blood in stool  [ ]   hematemesis GENITOURINARY:  [ ]   dysuria  [ ]   hematuria PSYCHIATRIC:  [ ]  history of major depression INTEGUMENTARY:  [ ]  rashes  [ ]  ulcers CONSTITUTIONAL:  [ ]  fever   [ ]  chills  PHYSICAL EXAM: Filed Vitals:   08/04/12 0521  BP: 136/85  Pulse: 83  Temp: 97.8 F (36.6 C)  TempSrc: Oral  Resp: 18  SpO2: 97%   There is no height or weight on file to calculate BMI. GENERAL: The patient is a well-nourished male, in no acute distress. The vital signs are documented above. CARDIOVASCULAR: There is a regular rate and rhythm. PULMONARY: There is good air exchange bilaterally without wheezing or rales. ABDOMEN: Soft and non-tender with normal pitched bowel sounds.  MUSCULOSKELETAL: There are no major deformities or cyanosis. NEUROLOGIC: No focal weakness or paresthesias are detected. SKIN: There are no ulcers or rashes noted. PSYCHIATRIC: The patient has a normal affect.  DATA:  Lab Results  Component Value Date   WBC 6.0 08/04/2012   HGB 10.3* 08/04/2012   HCT 31.3* 08/04/2012   MCV  105.7* 08/04/2012   PLT 230 08/04/2012   Lab Results  Component Value Date   NA  135 08/04/2012   K 4.1 08/04/2012   CL 94* 08/04/2012   CO2 27 08/04/2012   Lab Results  Component Value Date   CREATININE 6.02* 08/04/2012   Lab Results  Component Value Date   INR 1.06 07/15/2012   INR 1.37 02/21/2012   INR 1.40 02/18/2012   I reviewed his venogram from 07/15/12. I did not see any specific problems with the graft or at the arterial anastomosis. The main issue appears to be the vein.  MEDICAL ISSUES: Given that this graft has clotted 3 times now and it was only placed in August I think the issue is a poor quality vein. Therefore I do not think thrombectomy of this graft is indicated. I have recommended placement of a tunneled dialysis catheter. We will arrange for elective lead placement of a new left upper arm AV graft.  DICKSON,CHRISTOPHER S [facility] [place] Beeper: [phone]

## 2012-08-14 NOTE — Progress Notes (Signed)
Report given to teresa rn as caregiver 

## 2012-08-14 NOTE — Op Note (Signed)
OPERATIVE REPORT  Date of Surgery: 08/14/2012  Surgeon: Josephina Gip, MD  Assistant: Lianne Cure PA  Pre-op Diagnosis: End Stage Renal Disease  Post-op Diagnosis: End Stage Renal Disease  Procedure: Procedure(s): INSERTION OF ARTERIOVENOUS (AV) GORE-TEX GRAFT left upper arm-brachial artery-axillary vein using 6 mm Gore-Tex Anesthesia: MAC  EBL: Minimal  Complications: None  Procedure Details: Patient was taken to the operating room placed in the supine position at which time the left upper extremity was prepped with Betadine scrub and solution draped in the routine sterile manner. After infiltration with 1% Xylocaine with epinephrine longitudinal incision was made in the distal upper arm just distal to the previous one where grafted been revised from the forearm. Incision was carried down to subcutaneous tissue the brachial artery and vein were identified the vein was quite thickened. The artery was a 3-1/2 mm in size adnexal and pulse free of disease. It was encircled vessel loops. Second incision was made just spoke to the axilla carried down to subcutaneous tissue in longitudinal fashion. Axillary vein was exposed right at the junction of the brachial and axillary vein. It was in circle with vessel loops. A curvilinear tunnel was created on the anterior aspect of the arm and a 6 mm Gore-Tex graft liver through the tunnel. No heparin was given. Artery was occluded proximally and distally with Vesseloops a 15 blade extended with Potts scissors Gore-Tex was anastomosed end to side with 6-0 Prolene. Following this the vein was ligated distally transected it was a 5 mm vein Gore-Tex cut appropriately and anastomosed in end to the axillary vein with 6-0 Prolene. Clamps were then released there was excellent pulse and thrill in the graft. There is slight diminution of flow in the radial and ulnar arteries with the graft open. Adequate hemostasis was achieved wounds closed in layers with Vicryl in  subcuticular fashion with Dermabond patient taken to recovery room in stable condition   Josephina Gip, MD 08/14/2012 1:15 PM

## 2012-08-14 NOTE — Progress Notes (Signed)
Dialysis access record faxed to [facility] dialysis center

## 2012-08-14 NOTE — Interval H&P Note (Signed)
History and Physical Interval Note:  08/14/2012 11:03 AM  Bryan Gilmore.  has presented today for surgery, with the diagnosis of ESRD  The various methods of treatment have been discussed with the patient and family. After consideration of risks, benefits and other options for treatment, the patient has consented to  Procedure(s) (LRB) with comments: INSERTION OF ARTERIOVENOUS (AV) GORE-TEX GRAFT ARM (Left) as a surgical intervention .  The patient's history has been reviewed, patient examined, no change in status, stable for surgery.  I have reviewed the patient's chart and labs.  Questions were answered to the patient's satisfaction.     Josephina Gip

## 2012-08-14 NOTE — Preoperative (Signed)
Beta Blockers   Reason not to administer Beta Blockers: not prescribed 

## 2012-08-15 ENCOUNTER — Encounter (HOSPITAL_COMMUNITY): Payer: Self-pay | Admitting: Vascular Surgery

## 2012-08-16 DIAGNOSIS — L97909 Non-pressure chronic ulcer of unspecified part of unspecified lower leg with unspecified severity: Secondary | ICD-10-CM | POA: Diagnosis not present

## 2012-08-16 DIAGNOSIS — G9340 Encephalopathy, unspecified: Secondary | ICD-10-CM | POA: Diagnosis not present

## 2012-08-16 DIAGNOSIS — R262 Difficulty in walking, not elsewhere classified: Secondary | ICD-10-CM | POA: Diagnosis not present

## 2012-08-23 DIAGNOSIS — N390 Urinary tract infection, site not specified: Secondary | ICD-10-CM | POA: Diagnosis not present

## 2012-08-23 DIAGNOSIS — N35919 Unspecified urethral stricture, male, unspecified site: Secondary | ICD-10-CM | POA: Diagnosis not present

## 2012-09-05 DIAGNOSIS — G4700 Insomnia, unspecified: Secondary | ICD-10-CM | POA: Diagnosis not present

## 2012-09-05 DIAGNOSIS — F329 Major depressive disorder, single episode, unspecified: Secondary | ICD-10-CM | POA: Diagnosis not present

## 2012-09-05 DIAGNOSIS — F29 Unspecified psychosis not due to a substance or known physiological condition: Secondary | ICD-10-CM | POA: Diagnosis not present

## 2012-09-05 DIAGNOSIS — F411 Generalized anxiety disorder: Secondary | ICD-10-CM | POA: Diagnosis not present

## 2012-09-20 ENCOUNTER — Other Ambulatory Visit (INDEPENDENT_AMBULATORY_CARE_PROVIDER_SITE_OTHER): Payer: Self-pay | Admitting: General Surgery

## 2012-09-20 DIAGNOSIS — M79609 Pain in unspecified limb: Secondary | ICD-10-CM | POA: Diagnosis not present

## 2012-09-20 DIAGNOSIS — B351 Tinea unguium: Secondary | ICD-10-CM | POA: Diagnosis not present

## 2012-09-20 NOTE — Progress Notes (Signed)
Pt presented today without an appointment.  Contacted Dr. Donell Beers to determine if she could see pt.  After review of records and consultation with Dr. Johna Sheriff, she indicated the patient would not be seen today and the patient was to be advised to his primary care doctor or the surgeon who did the surgery.  Additionally, Dr. Donell Beers contacted Lenny Pastel at [facility] and advised accordingly.  [facility] will refer the patient appropriately for care.  I advised patient of the above information and called for the patient to be picked up by [facility].  Sonja M. Esperanza Richters

## 2012-09-26 ENCOUNTER — Other Ambulatory Visit (HOSPITAL_COMMUNITY): Payer: Self-pay | Admitting: Nephrology

## 2012-09-26 DIAGNOSIS — N186 End stage renal disease: Secondary | ICD-10-CM

## 2012-09-30 ENCOUNTER — Ambulatory Visit (HOSPITAL_COMMUNITY): Admission: RE | Admit: 2012-09-30 | Payer: Medicare Other | Source: Ambulatory Visit

## 2012-10-01 DIAGNOSIS — N039 Chronic nephritic syndrome with unspecified morphologic changes: Secondary | ICD-10-CM | POA: Diagnosis not present

## 2012-10-01 DIAGNOSIS — N186 End stage renal disease: Secondary | ICD-10-CM | POA: Diagnosis not present

## 2012-10-01 DIAGNOSIS — I10 Essential (primary) hypertension: Secondary | ICD-10-CM | POA: Diagnosis not present

## 2012-10-01 DIAGNOSIS — R05 Cough: Secondary | ICD-10-CM | POA: Diagnosis not present

## 2012-10-01 DIAGNOSIS — E43 Unspecified severe protein-calorie malnutrition: Secondary | ICD-10-CM | POA: Diagnosis not present

## 2012-10-02 DIAGNOSIS — F411 Generalized anxiety disorder: Secondary | ICD-10-CM | POA: Diagnosis not present

## 2012-10-02 DIAGNOSIS — G4700 Insomnia, unspecified: Secondary | ICD-10-CM | POA: Diagnosis not present

## 2012-10-02 DIAGNOSIS — F329 Major depressive disorder, single episode, unspecified: Secondary | ICD-10-CM | POA: Diagnosis not present

## 2012-10-02 DIAGNOSIS — F29 Unspecified psychosis not due to a substance or known physiological condition: Secondary | ICD-10-CM | POA: Diagnosis not present

## 2012-10-03 ENCOUNTER — Other Ambulatory Visit (HOSPITAL_COMMUNITY): Payer: Self-pay | Admitting: Nephrology

## 2012-10-03 DIAGNOSIS — N186 End stage renal disease: Secondary | ICD-10-CM

## 2012-10-04 ENCOUNTER — Ambulatory Visit (HOSPITAL_COMMUNITY)
Admission: RE | Admit: 2012-10-04 | Discharge: 2012-10-04 | Disposition: A | Discharge status: Home / Self Care | Payer: Medicare Other | Source: Ambulatory Visit | Attending: Nephrology | Admitting: Nephrology

## 2012-10-04 ENCOUNTER — Ambulatory Visit (INDEPENDENT_AMBULATORY_CARE_PROVIDER_SITE_OTHER): Payer: BC Managed Care – PPO | Admitting: General Surgery

## 2012-10-04 ENCOUNTER — Encounter (HOSPITAL_COMMUNITY): Payer: Self-pay

## 2012-10-04 ENCOUNTER — Other Ambulatory Visit (HOSPITAL_COMMUNITY): Payer: Self-pay | Admitting: Nephrology

## 2012-10-04 DIAGNOSIS — N186 End stage renal disease: Secondary | ICD-10-CM | POA: Diagnosis not present

## 2012-10-04 DIAGNOSIS — T82898A Other specified complication of vascular prosthetic devices, implants and grafts, initial encounter: Secondary | ICD-10-CM | POA: Insufficient documentation

## 2012-10-04 DIAGNOSIS — Z992 Dependence on renal dialysis: Secondary | ICD-10-CM | POA: Insufficient documentation

## 2012-10-04 DIAGNOSIS — I120 Hypertensive chronic kidney disease with stage 5 chronic kidney disease or end stage renal disease: Secondary | ICD-10-CM | POA: Insufficient documentation

## 2012-10-04 DIAGNOSIS — Z21 Asymptomatic human immunodeficiency virus [HIV] infection status: Secondary | ICD-10-CM | POA: Insufficient documentation

## 2012-10-04 DIAGNOSIS — Y832 Surgical operation with anastomosis, bypass or graft as the cause of abnormal reaction of the patient, or of later complication, without mention of misadventure at the time of the procedure: Secondary | ICD-10-CM | POA: Insufficient documentation

## 2012-10-04 MED ORDER — FENTANYL CITRATE 0.05 MG/ML IJ SOLN
INTRAMUSCULAR | Status: AC
  Filled 2012-10-04: qty 4

## 2012-10-04 MED ORDER — IOHEXOL 300 MG/ML  SOLN
100.0000 mL | Freq: Once | INTRAMUSCULAR | Status: AC | PRN
  Administered 2012-10-04: 50 mL via INTRAVENOUS

## 2012-10-04 MED ORDER — MIDAZOLAM HCL 2 MG/2ML IJ SOLN
INTRAMUSCULAR | Status: AC | PRN
  Administered 2012-10-04: 1 mg via INTRAVENOUS

## 2012-10-04 MED ORDER — MIDAZOLAM HCL 2 MG/2ML IJ SOLN
INTRAMUSCULAR | Status: AC
  Filled 2012-10-04: qty 4

## 2012-10-04 MED ORDER — ALTEPLASE 100 MG IV SOLR
4.0000 mg | Freq: Once | INTRAVENOUS | Status: AC
  Administered 2012-10-04: 4 mg
  Filled 2012-10-04: qty 4

## 2012-10-04 MED ORDER — FENTANYL CITRATE 0.05 MG/ML IJ SOLN
INTRAMUSCULAR | Status: AC | PRN
  Administered 2012-10-04: 50 ug via INTRAVENOUS

## 2012-10-04 MED ORDER — HEPARIN SODIUM (PORCINE) 1000 UNIT/ML IJ SOLN
INTRAMUSCULAR | Status: AC
  Filled 2012-10-04: qty 1

## 2012-10-04 NOTE — H&P (Signed)
Bryan Gilmore. is an 62 y.o. male.   Chief Complaint: ESRD Left upper arm dialysis graft clotted approx 60 weeks old Last use: 1/21 - good flow Attempt: 1/23 - clotted Last intervention: never on this graft Scheduled for left upper arm graft thrombolysis and possible angioplasty/stent placement Pt has existing dialysis catheter HPI: ESRD; HTN; HIV; Hep C; alc abuse; Irreg HR  Past Medical History  Diagnosis Date  . Hypertension   . HIV (human immunodeficiency virus infection)   . Sepsis(995.91)   . Gout   . Hepatitis C   . Fournier's gangrene   . Anemia   . Severe protein-calorie malnutrition   . Alcohol abuse   . End stage renal disease     hemodialysis tues, thurs, sat  . Dysrhythmia     AFib, not a candidate for coumadin , dr [place] signed off on pt    Past Surgical History  Procedure Date  . Cystoscopy with urethral dilatation 02/17/2012    Procedure: CYSTOSCOPY WITH URETHRAL DILATATION;  Surgeon: Martina Sinner, MD;  Location: [facility] ORS;  Service: Urology;  Laterality: N/A;  retrograde urethragram, placement of foley catheter exam under anesthesia   . Insertion of dialysis catheter 02/20/2012    Procedure: INSERTION OF DIALYSIS CATHETER;  Surgeon: Chuck Hint, MD;  Location: [facility] OR;  Service: Vascular;  Laterality: N/A;  . Irrigation and debridement abscess 02/29/2012    Procedure: IRRIGATION AND DEBRIDEMENT ABSCESS;  Surgeon: Martina Sinner, MD;  Location: [facility] OR;  Service: Urology;  Laterality: N/A;  I&D of scrotum abcess  . Insertion of dialysis catheter 03/29/2012    Procedure: INSERTION OF DIALYSIS CATHETER;  Surgeon: Larina Earthly, MD;  Location: [facility] OR;  Service: Vascular;  Laterality: N/A;  Insertion tunneled dialysis catheter right IJ  . Av fistula placement 04/12/2012    Procedure: INSERTION OF ARTERIOVENOUS (AV) GORE-TEX GRAFT ARM;  Surgeon: Larina Earthly, MD;  Location: [facility] OR;  Service: Vascular;  Laterality: Left;  insertion of 6x50 stretch goretex graft    . Thrombectomy and revision of arterioventous (av) goretex  graft 07/15/2012    Procedure: THROMBECTOMY AND REVISION OF ARTERIOVENTOUS (AV) GORETEX  GRAFT;  Surgeon: Sherren Kerns, MD;  Location: [facility] OR;  Service: Vascular;  Laterality: Left;  . Urethrogram 07/30/2012    Procedure: URETHROGRAM;  Surgeon: Martina Sinner, MD;  Location: [facility] ORS;  Service: Urology;  Laterality: N/A;  cystogram  . Cystoscopy 07/30/2012    Procedure: CYSTOSCOPY;  Surgeon: Martina Sinner, MD;  Location: [facility] ORS;  Service: Urology;  Laterality: Bilateral;  retrograde  . Insertion of dialysis catheter 08/04/2012    Procedure: INSERTION OF DIALYSIS CATHETER;  Surgeon: Chuck Hint, MD;  Location: [facility] OR;  Service: Vascular;  Laterality: Right;  right intrajugular hemodialysis catheter insertion  . Av fistula placement 08/14/2012    Procedure: INSERTION OF ARTERIOVENOUS (AV) GORE-TEX GRAFT ARM;  Surgeon: Pryor Ochoa, MD;  Location[facility] OR;  Service: Vascular;  Laterality: Left;    Family History  Problem Relation Age of Onset  . Hypertension Mother   . Hypertension Father    Social History:  reports that he has never smoked. He has never used smokeless tobacco. He reports that he does not drink alcohol or use illicit drugs.  Allergies: No Known Allergies   (Not in a hospital admission)  No results found for this or any previous visit (from the past 48 hour(s)). No results found.  Review of Systems  Constitutional: Negative for fever.  Respiratory: Negative for shortness of breath.   Cardiovascular: Negative for chest pain.  Gastrointestinal: Negative for nausea and vomiting.  Neurological: Positive for weakness. Negative for headaches.    Blood pressure 99/72, pulse 91, temperature 99.2 F (37.3 C), temperature source Oral, resp. rate 14, SpO2 100.00%. Physical Exam  Constitutional: He appears well-developed and well-nourished.  Cardiovascular: Normal rate.   No murmur heard.       Irreg  Respiratory: Effort normal and breath sounds normal. No respiratory distress.  GI: Soft. Bowel sounds are normal. There is no tenderness.  Musculoskeletal: Normal range of motion.       Left upper arm graft clotted  Neurological: He is alert.  Psychiatric: He has a normal mood and affect. His behavior is normal. Judgment and thought content normal.     Assessment/Plan Left upper graft clotted Scheduled for thrombolysis and poss pta/stent Pt has existing dial catheter Pt aware of procedure benefits and risks and agreeable to proceed Consent signed and in chart  Bryan Gilmore A 10/04/2012, 1:04 PM

## 2012-10-04 NOTE — Procedures (Signed)
Technically successful declot of left upper arm graft.  No immediate post procedural complications. 

## 2012-10-07 ENCOUNTER — Other Ambulatory Visit (HOSPITAL_COMMUNITY): Payer: Self-pay | Admitting: Nephrology

## 2012-10-07 DIAGNOSIS — N186 End stage renal disease: Secondary | ICD-10-CM

## 2012-10-09 ENCOUNTER — Ambulatory Visit (HOSPITAL_COMMUNITY)
Admission: RE | Admit: 2012-10-09 | Discharge: 2012-10-09 | Disposition: A | Discharge status: Home / Self Care | Payer: Medicare Other | Source: Ambulatory Visit | Attending: Nephrology | Admitting: Nephrology

## 2012-10-09 ENCOUNTER — Other Ambulatory Visit: Payer: Self-pay

## 2012-10-09 ENCOUNTER — Encounter (HOSPITAL_COMMUNITY): Payer: Self-pay

## 2012-10-10 DIAGNOSIS — L0211 Cutaneous abscess of neck: Secondary | ICD-10-CM | POA: Diagnosis not present

## 2012-10-10 DIAGNOSIS — K9403 Colostomy malfunction: Secondary | ICD-10-CM | POA: Diagnosis not present

## 2012-10-10 DIAGNOSIS — K9413 Enterostomy malfunction: Secondary | ICD-10-CM | POA: Diagnosis not present

## 2012-10-10 DIAGNOSIS — I4891 Unspecified atrial fibrillation: Secondary | ICD-10-CM | POA: Diagnosis not present

## 2012-10-10 DIAGNOSIS — L723 Sebaceous cyst: Secondary | ICD-10-CM | POA: Diagnosis not present

## 2012-10-10 DIAGNOSIS — Z433 Encounter for attention to colostomy: Secondary | ICD-10-CM | POA: Diagnosis not present

## 2012-10-10 MED ORDER — SODIUM CHLORIDE 0.9 % IV SOLN: INTRAVENOUS | Status: DC

## 2012-10-10 MED ORDER — HYDROMORPHONE HCL PF 1 MG/ML IJ SOLN: 0.2500 mg | INTRAMUSCULAR | Status: DC | PRN

## 2012-10-10 MED ORDER — DEXTROSE 5 % IV SOLN
1.5000 g | INTRAVENOUS | Status: DC
  Filled 2012-10-10: qty 1.5

## 2012-10-10 MED ORDER — ACETAMINOPHEN 10 MG/ML IV SOLN: 1000.0000 mg | Freq: Once | INTRAVENOUS | Status: AC | PRN

## 2012-10-10 MED ORDER — ONDANSETRON HCL 4 MG/2ML IJ SOLN: 4.0000 mg | Freq: Once | INTRAMUSCULAR | Status: AC | PRN

## 2012-10-10 NOTE — Progress Notes (Signed)
I spoke with [place] at [place] and went over pre op instructions.

## 2012-10-11 ENCOUNTER — Encounter (HOSPITAL_COMMUNITY): Admission: RE | Payer: Self-pay | Source: Ambulatory Visit

## 2012-10-11 ENCOUNTER — Ambulatory Visit (HOSPITAL_COMMUNITY): Admission: RE | Admit: 2012-10-11 | Payer: Self-pay | Source: Ambulatory Visit | Admitting: Vascular Surgery

## 2012-10-11 SURGERY — THROMBECTOMY AND REVISION OF ARTERIOVENTOUS (AV) GORETEX  GRAFT
Anesthesia: Monitor Anesthesia Care | Site: Arm Upper | Laterality: Left

## 2012-10-29 ENCOUNTER — Encounter: Payer: Self-pay | Admitting: Vascular Surgery

## 2012-10-30 ENCOUNTER — Encounter: Payer: Self-pay | Admitting: *Deleted

## 2012-10-30 ENCOUNTER — Ambulatory Visit (INDEPENDENT_AMBULATORY_CARE_PROVIDER_SITE_OTHER): Payer: Medicare Other | Admitting: Vascular Surgery

## 2012-10-30 ENCOUNTER — Encounter: Payer: Self-pay | Admitting: Vascular Surgery

## 2012-10-30 VITALS — BP 119/79 | HR 98 | Ht 71.0 in | Wt 206.9 lb

## 2012-10-30 DIAGNOSIS — N186 End stage renal disease: Secondary | ICD-10-CM

## 2012-10-30 NOTE — Progress Notes (Signed)
Vascular and Vein Specialist of [place]  Patient name: Bryan Gilmore. MRN: [id number] DOB: 06/24/1951 Sex: male  REASON FOR VISIT: follow up of hemodialysis access.  HPI: Shamel J Traub Gilmore. is a 62 y.o. male who had a new left brachial axillary graft placed on 08/14/2012. He had a functioning right IJ tunneled dialysis catheter. The graft clotted soon after that he began using it and it was felt that the graft could not be salvaged. Comes in today to be scheduled for new access. He denies any recent uremic symptoms. Specifically, he denies nausea, vomiting, fatigue, anorexia, or palpitations. He dialyzes on Tuesdays Thursdays and Saturdays   REVIEW OF SYSTEMS: [X ] denotes positive finding; [  ] denotes negative finding  CARDIOVASCULAR:  [ ] chest pain   [ ] dyspnea on exertion    CONSTITUTIONAL:  [ ] fever   [ ] chills  PHYSICAL EXAM: Filed Vitals:   10/30/12 1344  BP: 119/79  Pulse: 98  Height: 5' 11" (1.803 m)  Weight: 206 lb 14.4 oz (93.849 kg)  SpO2: 100%   Body mass index is 28.87 kg/(m^2). GENERAL: The patient is a well-nourished male, in no acute distress. The vital signs are documented above. CARDIOVASCULAR: There is a regular rate and rhythm  PULMONARY: There is good air exchange bilaterally without wheezing or rales. His upper arm graft in forearm graft on the left are chronically occluded.  MEDICAL ISSUES:  End stage renal disease This patient needs new access. Based on his previous vein map from June of 2013, it appears that he will likely require an AV graft. The forearm and upper arm cephalic veins were small area of the basilic vein on the right was marginal. However, I will interrogate with the ultrasound scan or intraoperatively before making a final decision. If he is not a candidate for a fistula then we would proceed with placement of an AV graft. He dialyzes on Tuesdays Thursdays and Saturdays. We'll arrangements for placement of a new fistula or graft in the  right arm on 11/08/2012. The procedure and risks were discussed with the patient. All of his questions were answered and he is agreeable to proceed.   DICKSON,CHRISTOPHER S [facility] [place] Beeper: [phone]     

## 2012-10-30 NOTE — Assessment & Plan Note (Signed)
This patient needs new access. Based on his previous vein map from June of 2013, it appears that he will likely require an AV graft. The forearm and upper arm cephalic veins were small area of the basilic vein on the right was marginal. However, I will interrogate with the ultrasound scan or intraoperatively before making a final decision. If he is not a candidate for a fistula then we would proceed with placement of an AV graft. He dialyzes on Tuesdays Thursdays and Saturdays. We'll arrangements for placement of a new fistula or graft in the right arm on 11/08/2012. The procedure and risks were discussed with the patient. All of his questions were answered and he is agreeable to proceed.

## 2012-10-31 ENCOUNTER — Encounter (HOSPITAL_COMMUNITY): Payer: Self-pay | Admitting: Pharmacist

## 2012-11-07 ENCOUNTER — Encounter (HOSPITAL_COMMUNITY): Payer: Self-pay | Admitting: *Deleted

## 2012-11-07 MED ORDER — DEXTROSE 5 % IV SOLN
1.5000 g | INTRAVENOUS | Status: AC
  Administered 2012-11-08: 1.5 g via INTRAVENOUS
  Filled 2012-11-07: qty 1.5

## 2012-11-08 ENCOUNTER — Ambulatory Visit (HOSPITAL_COMMUNITY): Payer: Medicare Other | Admitting: Anesthesiology

## 2012-11-08 ENCOUNTER — Inpatient Hospital Stay (HOSPITAL_COMMUNITY)
Admission: RE | Admit: 2012-11-08 | Discharge: 2012-11-09 | DRG: 673 | Disposition: A | Discharge status: Home / Self Care | Payer: Medicare Other | Source: Ambulatory Visit | Attending: Vascular Surgery | Admitting: Vascular Surgery

## 2012-11-08 ENCOUNTER — Encounter (HOSPITAL_COMMUNITY)
Admission: RE | Disposition: A | Discharge status: Home / Self Care | Payer: Self-pay | Source: Ambulatory Visit | Attending: Vascular Surgery

## 2012-11-08 ENCOUNTER — Telehealth: Payer: Self-pay | Admitting: Vascular Surgery

## 2012-11-08 ENCOUNTER — Other Ambulatory Visit: Payer: Self-pay | Admitting: *Deleted

## 2012-11-08 ENCOUNTER — Encounter (HOSPITAL_COMMUNITY): Payer: Self-pay | Admitting: *Deleted

## 2012-11-08 ENCOUNTER — Encounter (HOSPITAL_COMMUNITY): Payer: Self-pay | Admitting: Anesthesiology

## 2012-11-08 DIAGNOSIS — I120 Hypertensive chronic kidney disease with stage 5 chronic kidney disease or end stage renal disease: Principal | ICD-10-CM | POA: Diagnosis present

## 2012-11-08 DIAGNOSIS — M109 Gout, unspecified: Secondary | ICD-10-CM | POA: Diagnosis present

## 2012-11-08 DIAGNOSIS — Z4931 Encounter for adequacy testing for hemodialysis: Secondary | ICD-10-CM

## 2012-11-08 DIAGNOSIS — B1920 Unspecified viral hepatitis C without hepatic coma: Secondary | ICD-10-CM | POA: Diagnosis present

## 2012-11-08 DIAGNOSIS — Z21 Asymptomatic human immunodeficiency virus [HIV] infection status: Secondary | ICD-10-CM | POA: Diagnosis not present

## 2012-11-08 DIAGNOSIS — N186 End stage renal disease: Secondary | ICD-10-CM

## 2012-11-08 DIAGNOSIS — I4891 Unspecified atrial fibrillation: Secondary | ICD-10-CM | POA: Diagnosis present

## 2012-11-08 HISTORY — PX: AV FISTULA PLACEMENT: SHX1204

## 2012-11-08 LAB — POCT I-STAT 4, (NA,K, GLUC, HGB,HCT)
HCT: 33 % — ABNORMAL LOW (ref 39.0–52.0)
Hemoglobin: 11.2 g/dL — ABNORMAL LOW (ref 13.0–17.0)
Potassium: 3.8 mEq/L (ref 3.5–5.1)
Sodium: 140 mEq/L (ref 135–145)

## 2012-11-08 LAB — SURGICAL PCR SCREEN: Staphylococcus aureus: NEGATIVE

## 2012-11-08 SURGERY — ARTERIOVENOUS (AV) FISTULA CREATION
Anesthesia: General | Site: Arm Lower | Laterality: Right | Wound class: Clean

## 2012-11-08 MED ORDER — OXYCODONE HCL 5 MG PO TABS: 5.0000 mg | ORAL_TABLET | ORAL | Status: DC | PRN

## 2012-11-08 MED ORDER — PHENOL 1.4 % MT LIQD
1.0000 | OROMUCOSAL | Status: DC | PRN
  Filled 2012-11-08: qty 177

## 2012-11-08 MED ORDER — GUAIFENESIN-DM 100-10 MG/5ML PO SYRP
15.0000 mL | ORAL_SOLUTION | ORAL | Status: DC | PRN
  Filled 2012-11-08: qty 15

## 2012-11-08 MED ORDER — PHENYLEPHRINE HCL 10 MG/ML IJ SOLN
INTRAMUSCULAR | Status: DC | PRN
  Administered 2012-11-08 (×2): 120 ug via INTRAVENOUS
  Administered 2012-11-08 (×2): 80 ug via INTRAVENOUS
  Administered 2012-11-08: 40 ug via INTRAVENOUS

## 2012-11-08 MED ORDER — SODIUM CHLORIDE 0.9 % IJ SOLN: 3.0000 mL | INTRAMUSCULAR | Status: DC | PRN

## 2012-11-08 MED ORDER — 0.9 % SODIUM CHLORIDE (POUR BTL) OPTIME
TOPICAL | Status: DC | PRN
  Administered 2012-11-08: 1000 mL

## 2012-11-08 MED ORDER — MUPIROCIN 2 % EX OINT
TOPICAL_OINTMENT | CUTANEOUS | Status: AC
  Filled 2012-11-08: qty 22

## 2012-11-08 MED ORDER — HYDRALAZINE HCL 20 MG/ML IJ SOLN
10.0000 mg | INTRAMUSCULAR | Status: DC | PRN
  Filled 2012-11-08: qty 0.5

## 2012-11-08 MED ORDER — SODIUM CHLORIDE 0.9 % IR SOLN
Status: DC | PRN
  Administered 2012-11-08: 14:00:00

## 2012-11-08 MED ORDER — SODIUM CHLORIDE 0.9 % IJ SOLN
3.0000 mL | Freq: Two times a day (BID) | INTRAMUSCULAR | Status: DC
  Administered 2012-11-08: 3 mL via INTRAVENOUS

## 2012-11-08 MED ORDER — FENTANYL CITRATE 0.05 MG/ML IJ SOLN
INTRAMUSCULAR | Status: DC | PRN
  Administered 2012-11-08: 50 ug via INTRAVENOUS

## 2012-11-08 MED ORDER — ONDANSETRON HCL 4 MG/2ML IJ SOLN: 4.0000 mg | Freq: Four times a day (QID) | INTRAMUSCULAR | Status: DC | PRN

## 2012-11-08 MED ORDER — HYDROMORPHONE HCL PF 1 MG/ML IJ SOLN
INTRAMUSCULAR | Status: AC
  Filled 2012-11-08: qty 1

## 2012-11-08 MED ORDER — LABETALOL HCL 5 MG/ML IV SOLN
10.0000 mg | INTRAVENOUS | Status: DC | PRN
  Filled 2012-11-08: qty 4

## 2012-11-08 MED ORDER — LIDOCAINE HCL (PF) 1 % IJ SOLN
INTRAMUSCULAR | Status: AC
  Filled 2012-11-08: qty 30

## 2012-11-08 MED ORDER — LACTATED RINGERS IV SOLN: INTRAVENOUS | Status: DC

## 2012-11-08 MED ORDER — SODIUM CHLORIDE 0.9 % IV SOLN: INTRAVENOUS | Status: DC

## 2012-11-08 MED ORDER — SODIUM CHLORIDE 0.9 % IV SOLN: 250.0000 mL | INTRAVENOUS | Status: DC | PRN

## 2012-11-08 MED ORDER — PROTAMINE SULFATE 10 MG/ML IV SOLN
INTRAVENOUS | Status: DC | PRN
  Administered 2012-11-08: 10 mg via INTRAVENOUS
  Administered 2012-11-08: 20 mg via INTRAVENOUS

## 2012-11-08 MED ORDER — ALLOPURINOL 300 MG PO TABS
300.0000 mg | ORAL_TABLET | Freq: Every morning | ORAL | Status: DC
  Filled 2012-11-08: qty 1

## 2012-11-08 MED ORDER — HYDROMORPHONE HCL PF 1 MG/ML IJ SOLN
0.2500 mg | INTRAMUSCULAR | Status: DC | PRN
  Administered 2012-11-08 (×2): 0.5 mg via INTRAVENOUS

## 2012-11-08 MED ORDER — ONDANSETRON HCL 4 MG/2ML IJ SOLN
INTRAMUSCULAR | Status: DC | PRN
  Administered 2012-11-08: 4 mg via INTRAVENOUS

## 2012-11-08 MED ORDER — PROPOFOL 10 MG/ML IV BOLUS
INTRAVENOUS | Status: DC | PRN
  Administered 2012-11-08: 125 mg via INTRAVENOUS

## 2012-11-08 MED ORDER — LIDOCAINE-EPINEPHRINE (PF) 1 %-1:200000 IJ SOLN
INTRAMUSCULAR | Status: AC
  Filled 2012-11-08: qty 10

## 2012-11-08 MED ORDER — POTASSIUM CHLORIDE CRYS ER 20 MEQ PO TBCR
20.0000 meq | EXTENDED_RELEASE_TABLET | Freq: Once | ORAL | Status: AC
  Administered 2012-11-08: 20 meq via ORAL
  Filled 2012-11-08: qty 1

## 2012-11-08 MED ORDER — HEPARIN SODIUM (PORCINE) 1000 UNIT/ML IJ SOLN
INTRAMUSCULAR | Status: DC | PRN
  Administered 2012-11-08: 7000 [IU] via INTRAVENOUS

## 2012-11-08 MED ORDER — GABAPENTIN 100 MG PO CAPS
100.0000 mg | ORAL_CAPSULE | Freq: Three times a day (TID) | ORAL | Status: DC
  Administered 2012-11-08 (×2): 100 mg via ORAL
  Filled 2012-11-08 (×5): qty 1

## 2012-11-08 MED ORDER — LIDOCAINE HCL (CARDIAC) 20 MG/ML IV SOLN
INTRAVENOUS | Status: DC | PRN
  Administered 2012-11-08: 80 mg via INTRAVENOUS

## 2012-11-08 MED ORDER — MUPIROCIN 2 % EX OINT
TOPICAL_OINTMENT | Freq: Once | CUTANEOUS | Status: AC
  Administered 2012-11-08: 1 via NASAL

## 2012-11-08 MED ORDER — SODIUM CHLORIDE 0.9 % IV SOLN
INTRAVENOUS | Status: DC | PRN
  Administered 2012-11-08: 13:00:00 via INTRAVENOUS

## 2012-11-08 MED ORDER — MIDAZOLAM HCL 2 MG/2ML IJ SOLN: 1.0000 mg | INTRAMUSCULAR | Status: DC | PRN

## 2012-11-08 MED ORDER — PANTOPRAZOLE SODIUM 40 MG PO TBEC
40.0000 mg | DELAYED_RELEASE_TABLET | Freq: Every day | ORAL | Status: DC
  Administered 2012-11-08: 40 mg via ORAL
  Filled 2012-11-08: qty 1

## 2012-11-08 MED ORDER — FENTANYL CITRATE 0.05 MG/ML IJ SOLN: 50.0000 ug | Freq: Once | INTRAMUSCULAR | Status: DC

## 2012-11-08 MED ORDER — METOPROLOL TARTRATE 1 MG/ML IV SOLN
2.0000 mg | INTRAVENOUS | Status: DC | PRN
  Filled 2012-11-08: qty 5

## 2012-11-08 MED ORDER — THROMBIN 20000 UNITS EX SOLR
CUTANEOUS | Status: AC
  Filled 2012-11-08: qty 20000

## 2012-11-08 MED ORDER — OXYCODONE HCL 5 MG PO TABS
5.0000 mg | ORAL_TABLET | ORAL | Status: DC | PRN
  Administered 2012-11-08: 10 mg via ORAL
  Administered 2012-11-08: 5 mg via ORAL
  Filled 2012-11-08: qty 1
  Filled 2012-11-08: qty 2

## 2012-11-08 SURGICAL SUPPLY — 39 items
ADH SKN CLS APL DERMABOND .7 (GAUZE/BANDAGES/DRESSINGS) ×1
CANISTER SUCTION 2500CC (MISCELLANEOUS) ×2 IMPLANT
CLIP TI MEDIUM 6 (CLIP) ×2 IMPLANT
CLIP TI WIDE RED SMALL 6 (CLIP) ×3 IMPLANT
CLOTH BEACON ORANGE TIMEOUT ST (SAFETY) ×2 IMPLANT
COVER PROBE W GEL 5X96 (DRAPES) ×2 IMPLANT
COVER SURGICAL LIGHT HANDLE (MISCELLANEOUS) ×2 IMPLANT
DECANTER SPIKE VIAL GLASS SM (MISCELLANEOUS) ×2 IMPLANT
DERMABOND ADVANCED (GAUZE/BANDAGES/DRESSINGS) ×1
DERMABOND ADVANCED .7 DNX12 (GAUZE/BANDAGES/DRESSINGS) ×1 IMPLANT
DRAIN PENROSE 1/2X12 LTX STRL (WOUND CARE) IMPLANT
ELECT REM PT RETURN 9FT ADLT (ELECTROSURGICAL) ×2
ELECTRODE REM PT RTRN 9FT ADLT (ELECTROSURGICAL) ×1 IMPLANT
GEL ULTRASOUND 20GR AQUASONIC (MISCELLANEOUS) ×1 IMPLANT
GLOVE BIO SURGEON STRL SZ 6.5 (GLOVE) ×2 IMPLANT
GLOVE BIO SURGEON STRL SZ7.5 (GLOVE) ×2 IMPLANT
GLOVE BIOGEL PI IND STRL 6.5 (GLOVE) IMPLANT
GLOVE BIOGEL PI IND STRL 7.0 (GLOVE) IMPLANT
GLOVE BIOGEL PI IND STRL 8 (GLOVE) ×1 IMPLANT
GLOVE BIOGEL PI INDICATOR 6.5 (GLOVE) ×1
GLOVE BIOGEL PI INDICATOR 7.0 (GLOVE) ×1
GLOVE BIOGEL PI INDICATOR 8 (GLOVE) ×2
GLOVE ECLIPSE 6.5 STRL STRAW (GLOVE) ×1 IMPLANT
GOWN STRL NON-REIN LRG LVL3 (GOWN DISPOSABLE) ×5 IMPLANT
KIT BASIN OR (CUSTOM PROCEDURE TRAY) ×2 IMPLANT
KIT ROOM TURNOVER OR (KITS) ×2 IMPLANT
NS IRRIG 1000ML POUR BTL (IV SOLUTION) ×2 IMPLANT
PACK CV ACCESS (CUSTOM PROCEDURE TRAY) ×2 IMPLANT
PAD ARMBOARD 7.5X6 YLW CONV (MISCELLANEOUS) ×4 IMPLANT
SPONGE GAUZE 4X4 12PLY (GAUZE/BANDAGES/DRESSINGS) ×2 IMPLANT
SPONGE SURGIFOAM ABS GEL 100 (HEMOSTASIS) IMPLANT
SUT PROLENE 6 0 BV (SUTURE) ×2 IMPLANT
SUT VIC AB 3-0 SH 27 (SUTURE) ×2
SUT VIC AB 3-0 SH 27X BRD (SUTURE) ×1 IMPLANT
SUT VICRYL 4-0 PS2 18IN ABS (SUTURE) ×2 IMPLANT
TOWEL OR 17X24 6PK STRL BLUE (TOWEL DISPOSABLE) ×2 IMPLANT
TOWEL OR 17X26 10 PK STRL BLUE (TOWEL DISPOSABLE) ×2 IMPLANT
UNDERPAD 30X30 INCONTINENT (UNDERPADS AND DIAPERS) ×2 IMPLANT
WATER STERILE IRR 1000ML POUR (IV SOLUTION) ×2 IMPLANT

## 2012-11-08 NOTE — H&P (View-Only) (Signed)
[facility] and Vein Specialist of [place]  Patient name: Bryan Gilmore. MRN: [id number] DOB: 04-27-1951 Sex: male  REASON FOR VISIT: follow up of hemodialysis access.  HPI: Bryan Gilmore. is a 62 y.o. male who had a new left brachial axillary graft placed on 08/14/2012. He had a functioning right IJ tunneled dialysis catheter. The graft clotted soon after that he began using it and it was felt that the graft could not be salvaged. Comes in today to be scheduled for new access. He denies any recent uremic symptoms. Specifically, he denies nausea, vomiting, fatigue, anorexia, or palpitations. He dialyzes on Tuesdays Thursdays and Saturdays   REVIEW OF SYSTEMS: Arly.Keller ] denotes positive finding; [  ] denotes negative finding  CARDIOVASCULAR:  [ ]  chest pain   [ ]  dyspnea on exertion    CONSTITUTIONAL:  [ ]  fever   [ ]  chills  PHYSICAL EXAM: Filed Vitals:   10/30/12 1344  BP: 119/79  Pulse: 98  Height: 5\' 11"  (1.803 m)  Weight: 206 lb 14.4 oz (93.849 kg)  SpO2: 100%   Body mass index is 28.87 kg/(m^2). GENERAL: The patient is a well-nourished male, in no acute distress. The vital signs are documented above. CARDIOVASCULAR: There is a regular rate and rhythm  PULMONARY: There is good air exchange bilaterally without wheezing or rales. His upper arm graft in forearm graft on the left are chronically occluded.  MEDICAL ISSUES:  End stage renal disease This patient needs new access. Based on his previous vein map from June of 2013, it appears that he will likely require an AV graft. The forearm and upper arm cephalic veins were small area of the basilic vein on the right was marginal. However, I will interrogate with the ultrasound scan or intraoperatively before making a final decision. If he is not a candidate for a fistula then we would proceed with placement of an AV graft. He dialyzes on Tuesdays Thursdays and Saturdays. We'll arrangements for placement of a new fistula or graft in the  right arm on 11/08/2012. The procedure and risks were discussed with the patient. All of his questions were answered and he is agreeable to proceed.   Vicent Febles S [facility] [place] Beeper: [phone]

## 2012-11-08 NOTE — Telephone Encounter (Signed)
notified patient of fu appt. on 12-18-12 10:30 am

## 2012-11-08 NOTE — Progress Notes (Signed)
Notified Dr.Kasik of pt. Stating after surgery he doesn't have anyone to stay with him over night and planned to ride the [employer] bus home. Dr. Gypsy Balsam stated this was unacceptable and to notify Dr.Dickson.  Notified Dr. Edilia Bo,  stated he will admit pt.

## 2012-11-08 NOTE — Op Note (Signed)
NAME: [name].   MRN: [id number] DOB: 10/19/1950    DATE OF OPERATION: 11/08/2012  PREOP DIAGNOSIS: chronic kidney disease  POSTOP DIAGNOSIS: same  PROCEDURE: right radial cephalic AV fistula  SURGEON: Di Kindle. Edilia Bo, MD, FACS  ASSIST: Doreatha Massed, PA  ANESTHESIA: Gen.   EBL: minimal  INDICATIONS: Bryan Gilmore. is a 62 y.o. male presents for new access.  FINDINGS: 3.5 mm cephalic vein. 2.5 millimeter artery.  TECHNIQUE: The patient was brought to the operating room and received a general anesthetic. The right upper extremity was prepped and draped in the usual sterile fashion. An oblique incision was made in the right wrist and through this incision the cephalic vein was dissected free. It was ligated distally. Branches were divided between clips and 3-0 silk ties. Through the same incision, beneath the fascia, the radial artery was dissected free. The patient was heparinized. The vein was distended up with heparinized saline and was an approximately 3.5 mm vein. The radial artery was clamped proximally and distally and a longitudinal arteriotomy was made. The vein was spatulated and sewn end-to-side to the artery using continuous 6-0 Prolene suture. At the completion was a good thrill in the fistula. Hemostasis was obtained in the wound. The wound was closed in layers 3-0 Vicryl. The skin was closed with 4-0 Vicryl. Dermabond was applied. The patient tolerated the procedure well and was transferred to the recovery room in stable condition. All needle and sponge counts were correct.  Waverly Ferrari, MD, FACS [facility]  DATE OF DICTATION:   11/08/2012

## 2012-11-08 NOTE — Anesthesia Preprocedure Evaluation (Signed)
Anesthesia Evaluation  Patient identified by MRN, date of birth, ID band Patient awake    Reviewed: Allergy & Precautions, H&P , NPO status , Patient's Chart, lab work & pertinent test results  History of Anesthesia Complications Negative for: history of anesthetic complications  Airway Mallampati: II TM Distance: >3 FB Neck ROM: Full    Dental  (+) Dental Advisory Given and Poor Dentition   Pulmonary neg pulmonary ROS,  breath sounds clear to auscultation        Cardiovascular hypertension, + dysrhythmias Atrial Fibrillation Rhythm:Irregular Rate:Normal     Neuro/Psych negative neurological ROS  negative psych ROS   GI/Hepatic (+) Hepatitis -, C  Endo/Other  negative endocrine ROS  Renal/GU ESRF and DialysisRenal disease     Musculoskeletal   Abdominal   Peds  Hematology  (+) HIV,   Anesthesia Other Findings   Reproductive/Obstetrics                           Anesthesia Physical Anesthesia Plan  ASA: III  Anesthesia Plan: General   Post-op Pain Management:    Induction: Intravenous  Airway Management Planned: LMA  Additional Equipment:   Intra-op Plan:   Post-operative Plan: Extubation in OR  Informed Consent: I have reviewed the patients History and Physical, chart, labs and discussed the procedure including the risks, benefits and alternatives for the proposed anesthesia with the patient or authorized representative who has indicated his/her understanding and acceptance.     Plan Discussed with: CRNA and Surgeon  Anesthesia Plan Comments:         Anesthesia Quick Evaluation

## 2012-11-08 NOTE — Progress Notes (Signed)
Rt chest diatec intact,  dst U.  Ports capped.

## 2012-11-08 NOTE — Anesthesia Procedure Notes (Signed)
Procedure Name: LMA Insertion Date/Time: 11/08/2012 1:06 PM Performed by: Carmela Rima Pre-anesthesia Checklist: Patient identified, Timeout performed, Emergency Drugs available, Suction available and Patient being monitored Patient Re-evaluated:Patient Re-evaluated prior to inductionOxygen Delivery Method: Circle system utilized Preoxygenation: Pre-oxygenation with 100% oxygen Intubation Type: IV induction Ventilation: Mask ventilation without difficulty LMA: LMA inserted LMA Size: 4.0 Placement Confirmation: positive ETCO2 and breath sounds checked- equal and bilateral Tube secured with: Tape Dental Injury: Teeth and Oropharynx as per pre-operative assessment

## 2012-11-08 NOTE — Transfer of Care (Signed)
Immediate Anesthesia Transfer of Care Note  Patient: Bryan Gilmore.  Procedure(s) Performed: Procedure(s): ARTERIOVENOUS (AV) FISTULA CREATION (Right)  Patient Location: PACU  Anesthesia Type:General  Level of Consciousness: awake, alert  and oriented  Airway & Oxygen Therapy: Patient Spontanous Breathing and Patient connected to face mask oxygen  Post-op Assessment: Report given to PACU RN, Post -op Vital signs reviewed and stable and Patient moving all extremities X 4  Post vital signs: Reviewed and stable  Complications: No apparent anesthesia complications

## 2012-11-08 NOTE — Preoperative (Signed)
Beta Blockers   Reason not to administer Beta Blockers:Not Applicable 

## 2012-11-08 NOTE — Anesthesia Postprocedure Evaluation (Signed)
  Anesthesia Post-op Note  Patient: Bryan Gilmore.  Procedure(s) Performed: Procedure(s): ARTERIOVENOUS (AV) FISTULA CREATION (Right)  Patient Location: PACU  Anesthesia Type:General  Level of Consciousness: awake  Airway and Oxygen Therapy: Patient Spontanous Breathing  Post-op Pain: mild  Post-op Assessment: Post-op Vital signs reviewed, Patient's Cardiovascular Status Stable, Respiratory Function Stable, Patent Airway, No signs of Nausea or vomiting and Pain level controlled  Post-op Vital Signs: stable  Complications: No apparent anesthesia complications

## 2012-11-08 NOTE — Interval H&P Note (Signed)
History and Physical Interval Note:  11/08/2012 12:32 PM  Bryan Gilmore.  has presented today for surgery, with the diagnosis of ESRD  The various methods of treatment have been discussed with the patient and family. After consideration of risks, benefits and other options for treatment, the patient has consented to  Procedure(s) with comments: ARTERIOVENOUS (AV) FISTULA CREATION (Right) - POSSIBLE INSERTION GRAFT as a surgical intervention .  The patient's history has been reviewed, patient examined, no change in status, stable for surgery.  I have reviewed the patient's chart and labs.  Questions were answered to the patient's satisfaction.     Laurie Lovejoy S

## 2012-11-08 NOTE — Progress Notes (Signed)
Pt arrived from PACU. Alert and oriented. C/O 6/10 pain to rt arm, but declines pain medication at this time. No numbness or tingling. Incision site closed with skin glue, with approximated edges. See assessment for further details. Oriented pt to unit. Bed in low position. Call bell within reach. Will continue to monitor.

## 2012-11-09 NOTE — Progress Notes (Signed)
  VASCULAR AND VEIN SURGERY PROGRESS NOTE  POST-OP HEMODIALYSIS ACCESS  Date of Surgery: 11/08/2012 Surgeon: Surgeon(s): Chuck Hint, MD 1 Day Post-Op Right Procedure(s): ARTERIOVENOUS (AV) FISTULA CREATION   HPI: Bryan Gilmore. is a 62 y.o. male who is 1 Day Post-Op creation/revision of right upper extremity Hemodialysis access. The patient denies symptoms of numbness, tingling, weakness; complains of mild pain in the operative limb.   Significant Diagnostic Studies: CBC Lab Results  Component Value Date   WBC 6.0 08/04/2012   HGB 11.2* 11/08/2012   HCT 33.0* 11/08/2012   MCV 105.7* 08/04/2012   PLT 230 08/04/2012    BMET    Component Value Date/Time   NA 140 11/08/2012 1052   K 3.8 11/08/2012 1052   CL 94* 08/04/2012 0623   CO2 27 08/04/2012 0623   GLUCOSE 92 11/08/2012 1052   BUN 68* 08/04/2012 0623   CREATININE 6.02* 08/04/2012 0623   CREATININE 3.15* 05/22/2012 1008   CALCIUM 9.8 08/04/2012 0623   CALCIUM 8.7 04/11/2012 0743   GFRNONAA 9* 08/04/2012 0623   GFRAA 10* 08/04/2012 0623    COAG Lab Results  Component Value Date   INR 1.00 08/14/2012   INR 1.06 07/15/2012   INR 1.37 02/21/2012   No results found for this basename: PTT    Vital Signs  BP Readings from Last 3 Encounters:  11/09/12 103/75  11/09/12 103/75  10/30/12 119/79   Temp Readings from Last 3 Encounters:  11/09/12 98 F (36.7 C) Oral  11/09/12 98 F (36.7 C) Oral  10/04/12 99.2 F (37.3 C) Oral   SpO2 Readings from Last 3 Encounters:  11/09/12 97%  11/09/12 97%  10/30/12 100%   Pulse Readings from Last 3 Encounters:  11/09/12 83  11/09/12 83  10/30/12 98     Physical Examination  right upper Incision is healing well, skin color is normal , hand grip is 5/5, sensation in digits is intact;  There is a good thrill and good bruit in the right R-C AVF.  Assessment/Plan Bryan Barthold. is a 62 y.o. year old male who presents s/p creation/revision of right upper  extremity Hemodialysis access. Follow-up in 6 weeks  The patient's access will be ready for use in 12 weeks.  Bryan Gilmore 11/09/2012 7:35 AM

## 2012-11-09 NOTE — Discharge Summary (Signed)
[facility] Discharge Summary   Patient ID:  Bryan Gilmore. MRN: [id number] DOB/AGE: Jul 11, 1951 62 y.o.  Admit date: 11/08/2012 Discharge date: 11/09/2012 Date of Surgery: 11/08/2012 Surgeon: Surgeon(s): Chuck Hint, MD  Admission Diagnosis: ESRD  Discharge Diagnoses:  ESRD  Secondary Diagnoses: Past Medical History  Diagnosis Date  . HIV (human immunodeficiency virus infection)   . Sepsis(995.91)   . Gout   . Hepatitis C   . Fournier's gangrene   . Anemia   . Severe protein-calorie malnutrition   . Alcohol abuse   . End stage renal disease     hemodialysis tues, thurs, sat  . Dysrhythmia     AFib, not a candidate for coumadin , dr [place] signed off on pt    Procedure(s): ARTERIOVENOUS (AV) FISTULA CREATION  Discharged Condition: good  HPI:  Bryan Lubeck. is a 62 y.o. male who had a new left brachial axillary graft placed on 08/14/2012. He had a functioning right IJ tunneled dialysis catheter. The graft clotted soon after that he began using it and it was felt that the graft could not be salvaged. Comes in today to be scheduled for new access. He denies any recent uremic symptoms. Specifically, he denies nausea, vomiting, fatigue, anorexia, or palpitations. He dialyzes on Tuesdays Thursdays and Saturdays   Hospital Course:  Bryan Capp. is a 62 y.o. male is S/P Right Procedure(s): ARTERIOVENOUS (AV) FISTULA CREATION Post-op wounds healing well Good thrill and bruit in AVF Pt. Ambulating, voiding and taking PO diet without difficulty. Pt pain controlled with PO pain meds. Labs as below Complications:none  Consults:     Significant Diagnostic Studies: CBC Lab Results  Component Value Date   WBC 6.0 08/04/2012   HGB 11.2* 11/08/2012   HCT 33.0* 11/08/2012   MCV 105.7* 08/04/2012   PLT 230 08/04/2012    BMET    Component Value Date/Time   NA 140 11/08/2012 1052   K 3.8 11/08/2012 1052   CL 94* 08/04/2012 0623   CO2  27 08/04/2012 0623   GLUCOSE 92 11/08/2012 1052   BUN 68* 08/04/2012 0623   CREATININE 6.02* 08/04/2012 0623   CREATININE 3.15* 05/22/2012 1008   CALCIUM 9.8 08/04/2012 0623   CALCIUM 8.7 04/11/2012 0743   GFRNONAA 9* 08/04/2012 0623   GFRAA 10* 08/04/2012 0623   COAG Lab Results  Component Value Date   INR 1.00 08/14/2012   INR 1.06 07/15/2012   INR 1.37 02/21/2012     Disposition:  Discharge to :Home Discharge Orders   Future Appointments Provider Department Dept Phone   12/18/2012 10:30 AM [facility] [phone]   12/18/2012 11:45 AM Chuck Hint, MD [facility] [phone]   Future Orders Complete By Expires     Call MD for:  redness, tenderness, or signs of infection (pain, swelling, bleeding, redness, odor or green/yellow discharge around incision site)  As directed     Call MD for:  severe or increased pain, loss or decreased feeling  in affected limb(s)  As directed     Call MD for:  temperature >100.5  As directed     Driving Restrictions  As directed     Comments:      No driving for 24 hours    Increase activity slowly  As directed     Comments:      Walk with assistance use walker or cane as needed  Lifting restrictions  As directed     Comments:      No lifting for 6 weeks    May shower   As directed     Resume previous diet  As directed         Medication List    TAKE these medications       allopurinol 300 MG tablet  Commonly known as:  ZYLOPRIM  Take 300 mg by mouth every morning.     gabapentin 100 MG capsule  Commonly known as:  NEURONTIN  Take 100 mg by mouth 3 (three) times daily.     NEPHRO-VITE PO  Take 1 tablet by mouth daily.     oxyCODONE 5 MG immediate release tablet  Commonly known as:  ROXICODONE  Take 1 tablet (5 mg total) by mouth every 4 (four) hours as needed for pain.       Verbal and written Discharge instructions given to the patient. Wound  care per Discharge AVS     Follow-up Information   Follow up with DICKSON,CHRISTOPHER S, MD In 6 weeks. (sent)    Contact information:   [address] [place] [address] [phone]       Signed: Marlowe Shores 11/09/2012, 9:59 AM

## 2012-11-11 ENCOUNTER — Encounter (HOSPITAL_COMMUNITY): Payer: Self-pay | Admitting: Vascular Surgery

## 2012-11-11 ENCOUNTER — Encounter: Payer: Self-pay | Admitting: Vascular Surgery

## 2012-11-11 ENCOUNTER — Telehealth: Payer: Self-pay | Admitting: Vascular Surgery

## 2012-11-11 ENCOUNTER — Other Ambulatory Visit: Payer: Self-pay | Admitting: *Deleted

## 2012-11-11 NOTE — Telephone Encounter (Addendum)
Message copied by Shari Prows on Mon Nov 11, 2012  2:53 PM ------      Message from: Phillips Odor      Created: Mon Nov 11, 2012 10:27 AM      Regarding: FW: charge and f/u       Needs 6 wk. F/u w/ [facility]       Joyce Gross- can you place order for ultrasound of access at the f/u appt. Please?            ----- Message -----         From: Chuck Hint, MD         Sent: 11/08/2012   2:45 PM           To: Reuel Derby, Melene Plan, RN, #      Subject: charge and f/u                                           PROCEDURE: right radial cephalic AV fistula            SURGEON: Di Kindle. Edilia Bo, MD, FACS            ASSIST: Doreatha Massed, PA      He needs a follow up visit in 6 weeks to check on the maturation of this fistula. He needs a duplex when he comes in. Thank you.CD       ------this appt was already scheduled for 12/18/12 w/ csd. Pt was notified/awt

## 2012-11-11 NOTE — Discharge Summary (Signed)
Agree with above. Cari Caraway Beeper [phone] 11/11/2012

## 2012-11-13 ENCOUNTER — Other Ambulatory Visit: Payer: Self-pay | Admitting: Licensed Clinical Social Worker

## 2012-11-13 ENCOUNTER — Other Ambulatory Visit: Payer: Medicare Other

## 2012-11-13 DIAGNOSIS — B20 Human immunodeficiency virus [HIV] disease: Secondary | ICD-10-CM

## 2012-11-13 DIAGNOSIS — I10 Essential (primary) hypertension: Secondary | ICD-10-CM | POA: Diagnosis not present

## 2012-11-13 DIAGNOSIS — N186 End stage renal disease: Secondary | ICD-10-CM | POA: Diagnosis not present

## 2012-11-13 LAB — CBC WITH DIFFERENTIAL/PLATELET
Basophils Absolute: 0 10*3/uL (ref 0.0–0.1)
Eosinophils Absolute: 0.1 10*3/uL (ref 0.0–0.7)
Eosinophils Relative: 1 % (ref 0–5)
HCT: 33.8 % — ABNORMAL LOW (ref 39.0–52.0)
Lymphocytes Relative: 24 % (ref 12–46)
MCH: 34.5 pg — ABNORMAL HIGH (ref 26.0–34.0)
MCV: 102.4 fL — ABNORMAL HIGH (ref 78.0–100.0)
Monocytes Absolute: 0.8 10*3/uL (ref 0.1–1.0)
RDW: 14.2 % (ref 11.5–15.5)
WBC: 7.8 10*3/uL (ref 4.0–10.5)

## 2012-11-13 LAB — COMPREHENSIVE METABOLIC PANEL
ALT: 22 U/L (ref 0–53)
AST: 32 U/L (ref 0–37)
Albumin: 3.9 g/dL (ref 3.5–5.2)
CO2: 27 mEq/L (ref 19–32)
Calcium: 9.3 mg/dL (ref 8.4–10.5)
Chloride: 99 mEq/L (ref 96–112)
Creat: 6.02 mg/dL — ABNORMAL HIGH (ref 0.50–1.35)
Potassium: 3.9 mEq/L (ref 3.5–5.3)
Sodium: 139 mEq/L (ref 135–145)
Total Protein: 7.5 g/dL (ref 6.0–8.3)

## 2012-11-13 LAB — RPR

## 2012-11-18 ENCOUNTER — Ambulatory Visit (INDEPENDENT_AMBULATORY_CARE_PROVIDER_SITE_OTHER): Payer: BC Managed Care – PPO | Admitting: Surgery

## 2012-11-27 ENCOUNTER — Other Ambulatory Visit: Payer: Self-pay | Admitting: *Deleted

## 2012-11-27 ENCOUNTER — Other Ambulatory Visit: Payer: Self-pay

## 2012-11-27 ENCOUNTER — Encounter: Payer: Self-pay | Admitting: Internal Medicine

## 2012-11-27 ENCOUNTER — Ambulatory Visit (INDEPENDENT_AMBULATORY_CARE_PROVIDER_SITE_OTHER): Payer: Medicare Other | Admitting: Internal Medicine

## 2012-11-27 VITALS — BP 115/85 | HR 84 | Temp 97.9°F | Ht 71.0 in | Wt 209.0 lb

## 2012-11-27 DIAGNOSIS — M109 Gout, unspecified: Secondary | ICD-10-CM | POA: Diagnosis not present

## 2012-11-27 DIAGNOSIS — B20 Human immunodeficiency virus [HIV] disease: Secondary | ICD-10-CM | POA: Diagnosis not present

## 2012-11-27 DIAGNOSIS — Z23 Encounter for immunization: Secondary | ICD-10-CM | POA: Diagnosis not present

## 2012-11-27 DIAGNOSIS — Z0000 Encounter for general adult medical examination without abnormal findings: Secondary | ICD-10-CM

## 2012-11-27 DIAGNOSIS — I10 Essential (primary) hypertension: Secondary | ICD-10-CM | POA: Diagnosis not present

## 2012-11-27 MED ORDER — ALLOPURINOL 300 MG PO TABS: ORAL_TABLET | ORAL | Status: DC

## 2012-11-27 NOTE — Progress Notes (Signed)
[facility] NOTE  RFV: off of HIV meds, since jan 31st.  Subjective:    Patient ID: Bryan Pel., male    DOB: 1950-12-14, 62 y.o.   MRN: [id number]  HPI 62yo Male with HIV/HCV, ESRD, on tues-thru,CD 4 count of 450/VL 1950, previously on raltegravir, lopinavir/rit, lamivudine, zidovudine. He has been out of care and has not had his recent HIV medications.  Denies any recent illness  Current Outpatient Prescriptions on File Prior to Visit  Medication Sig Dispense Refill  . allopurinol (ZYLOPRIM) 300 MG tablet Take 300 mg by mouth every morning.       . B Complex-C-Folic Acid (NEPHRO-VITE PO) Take 1 tablet by mouth daily.      Marland Kitchen gabapentin (NEURONTIN) 100 MG capsule Take 100 mg by mouth 3 (three) times daily.      Marland Kitchen oxyCODONE (ROXICODONE) 5 MG immediate release tablet Take 1 tablet (5 mg total) by mouth every 4 (four) hours as needed for pain.  30 tablet  0   Current Facility-Administered Medications on File Prior to Visit  Medication Dose Route Frequency Provider Last Rate Last Dose  . HYDROmorphone (DILAUDID) injection 0.25-0.5 mg  0.25-0.5 mg Intravenous Q5 min PRN Kipp Brood, MD         Review of Systems 10 point ROS negative    Objective:   Physical Exam BP 115/85  Pulse 84  Temp(Src) 97.9 F (36.6 C) (Oral)  Ht 5\' 11"  (1.803 m)  Wt 209 lb (94.802 kg)  BMI 29.16 kg/m2 Physical Exam  Constitutional: He is oriented to person, place, and time. He appears well-developed and well-nourished. No distress.  HENT:  Mouth/Throat: Oropharynx is clear and moist. No oropharyngeal exudate.  Cardiovascular: Normal rate, regular rhythm and normal heart sounds. Exam reveals no gallop and no friction rub.  No murmur heard.  Pulmonary/Chest: Effort normal and breath sounds normal. No respiratory distress. He has no wheezes.  Abdominal: Soft. Bowel sounds are normal. He exhibits no distension. There is no tenderness.  Lymphadenopathy:  He has no cervical adenopathy.   Neurological: He is alert and oriented to person, place, and time.  Skin: Skin is warm and dry. No rash noted. No erythema.  Psychiatric: He has a normal mood and affect. His behavior is normal.          Assessment & Plan:  hiv = restart him on his old regimen  Health maintenance = will give hep B #1 now  rtc in 1 month

## 2012-12-06 ENCOUNTER — Telehealth: Payer: Self-pay | Admitting: *Deleted

## 2012-12-06 MED ORDER — LOPINAVIR-RITONAVIR 200-50 MG PO TABS: 2.0000 | ORAL_TABLET | Freq: Two times a day (BID) | ORAL | Status: DC

## 2012-12-06 MED ORDER — ZIDOVUDINE 300 MG PO TABS: 300.0000 mg | ORAL_TABLET | Freq: Every day | ORAL | Status: DC

## 2012-12-06 MED ORDER — RALTEGRAVIR POTASSIUM 400 MG PO TABS: 400.0000 mg | ORAL_TABLET | Freq: Two times a day (BID) | ORAL | Status: DC

## 2012-12-06 MED ORDER — LAMIVUDINE 100 MG PO TABS: 50.0000 mg | ORAL_TABLET | Freq: Every day | ORAL | Status: DC

## 2012-12-06 NOTE — Telephone Encounter (Signed)
Pt called [facility] wondering when his HIV medications were going to be delivered. RN spoke with Dr. Drue Second.  Pt currently has both Medicare and Medicaid.  Pt's preference is to have rxes delivered through [facility] in [place], [place].  Dr. Drue Second informed.  Will send refills.

## 2012-12-09 ENCOUNTER — Other Ambulatory Visit: Payer: Self-pay | Admitting: Nurse Practitioner

## 2012-12-16 ENCOUNTER — Ambulatory Visit: Payer: Self-pay | Admitting: Nurse Practitioner

## 2012-12-17 ENCOUNTER — Encounter: Payer: Self-pay | Admitting: Vascular Surgery

## 2012-12-18 ENCOUNTER — Other Ambulatory Visit: Payer: Self-pay

## 2012-12-18 ENCOUNTER — Encounter (INDEPENDENT_AMBULATORY_CARE_PROVIDER_SITE_OTHER): Payer: Medicare Other | Admitting: *Deleted

## 2012-12-18 ENCOUNTER — Encounter: Payer: Self-pay | Admitting: Vascular Surgery

## 2012-12-18 ENCOUNTER — Ambulatory Visit (INDEPENDENT_AMBULATORY_CARE_PROVIDER_SITE_OTHER): Payer: Medicare Other | Admitting: Vascular Surgery

## 2012-12-18 VITALS — BP 104/65 | HR 89 | Resp 16 | Ht 71.0 in | Wt 216.0 lb

## 2012-12-18 DIAGNOSIS — T82897A Other specified complication of cardiac prosthetic devices, implants and grafts, initial encounter: Secondary | ICD-10-CM

## 2012-12-18 DIAGNOSIS — Z4931 Encounter for adequacy testing for hemodialysis: Secondary | ICD-10-CM

## 2012-12-18 DIAGNOSIS — N186 End stage renal disease: Secondary | ICD-10-CM

## 2012-12-18 DIAGNOSIS — Z48812 Encounter for surgical aftercare following surgery on the circulatory system: Secondary | ICD-10-CM

## 2012-12-18 NOTE — Progress Notes (Signed)
  [facility] [place]  Assessment/Planning:  s/p 6 weeksarteriovenous fistula We will schedual him for a fistula gram and possible angioplasty with Dr. Edilia Bo.  Subjective  - He has numbness during dialysis, but other wise no signs of constant steal syndrome.  Objective Filed Vitals:   12/18/12 1130  BP: 104/65  Pulse: 89  Resp: 16  Height: 5\' 11"  (1.803 m)  Weight: 216 lb (97.977 kg)  SpO2: 100%    PULM  CTAB CV  RRR VASC  Palpable weak thrill right forearm. Radial pulse palpable.  Right upper extremity dialysis duplex : Narrowing proximal to the anastomosis .13and mid fistula.39 cm narrowing  Laboratory most recent CBC    Component Value Date/Time   WBC 7.8 11/13/2012 1536   HGB 11.4* 11/13/2012 1536   HCT 33.8* 11/13/2012 1536   PLT 243 11/13/2012 1536    BMET    Component Value Date/Time   NA 139 11/13/2012 1536   K 3.9 11/13/2012 1536   CL 99 11/13/2012 1536   CO2 27 11/13/2012 1536   GLUCOSE 83 11/13/2012 1536   BUN 34* 11/13/2012 1536   CREATININE 6.02* 11/13/2012 1536   CREATININE 6.02* 08/04/2012 0623   CALCIUM 9.3 11/13/2012 1536   CALCIUM 8.7 04/11/2012 0743   GFRNONAA 9* 08/04/2012 0623   GFRAA 10* 08/04/2012 9562    Thomasena Edis, Priti Consoli MAUREEN PA-C [facility] [place] Office: [phone] Pager: [phone]  12/18/2012, 11:59 AM  Agree with above. He appears to have 2 areas of stenosis in the fistula. One in the upper forearm and one adjacent to the anastomosis. Given that the right radiocephalic fistula has a fairly weak thrill I have recommended we performed a fistulogram in order to determine if he has stenoses amenable to venoplasty. This has been scheduled for 12/23/2012.  Waverly Ferrari, MD, FACS Beeper [phone] 12/18/2012

## 2012-12-23 ENCOUNTER — Ambulatory Visit (INDEPENDENT_AMBULATORY_CARE_PROVIDER_SITE_OTHER): Payer: Medicare Other | Admitting: Nurse Practitioner

## 2012-12-23 ENCOUNTER — Encounter (HOSPITAL_COMMUNITY): Payer: Self-pay | Admitting: Pharmacy Technician

## 2012-12-23 ENCOUNTER — Encounter: Payer: Self-pay | Admitting: Nurse Practitioner

## 2012-12-23 VITALS — BP 118/68 | HR 74 | Temp 96.4°F | Resp 16 | Wt 219.8 lb

## 2012-12-23 DIAGNOSIS — M1A00X0 Idiopathic chronic gout, unspecified site, without tophus (tophi): Secondary | ICD-10-CM

## 2012-12-23 DIAGNOSIS — B20 Human immunodeficiency virus [HIV] disease: Secondary | ICD-10-CM | POA: Diagnosis not present

## 2012-12-23 DIAGNOSIS — N186 End stage renal disease: Secondary | ICD-10-CM

## 2012-12-23 DIAGNOSIS — D649 Anemia, unspecified: Secondary | ICD-10-CM | POA: Diagnosis not present

## 2012-12-23 DIAGNOSIS — M1A9XX0 Chronic gout, unspecified, without tophus (tophi): Secondary | ICD-10-CM

## 2012-12-23 NOTE — Patient Instructions (Signed)
Please come back in 3 months with labs before.    Cardiac Diet This diet can help prevent heart disease and stroke. Many factors influence your heart health, including eating and exercise habits. Coronary risk rises a lot with abnormal blood fat (lipid) levels. Cardiac meal planning includes limiting unhealthy fats, increasing healthy fats, and making other small dietary changes. General guidelines are as follows:  Adjust calorie intake to reach and maintain desirable body weight.  Limit total fat intake to less than 30% of total calories. Saturated fat should be less than 7% of calories.  Saturated fats are found in animal products and in some vegetable products. Saturated vegetable fats are found in coconut oil, cocoa butter, palm oil, and palm kernel oil. Read labels carefully to avoid these products as much as possible. Use butter in moderation. Choose tub margarines and oils that have 2 grams of fat or less. Good cooking oils are canola and olive oils.  Practice low-fat cooking techniques. Do not fry food. Instead, broil, bake, boil, steam, grill, roast on a rack, stir-fry, or microwave it. Other fat reducing suggestions include:  Remove the skin from poultry.  Remove all visible fat from meats.  Skim the fat off stews, soups, and gravies before serving them.  Steam vegetables in water or broth instead of sauting them in fat.  Avoid foods with trans fat (or hydrogenated oils), such as commercially fried foods and commercially baked goods. Commercial shortening and deep-frying fats will contain trans fat.  Increase intake of fruits, vegetables, whole grains, and legumes to replace foods high in fat.  Increase consumption of nuts, legumes, and seeds to at least 4 servings weekly. One serving of a legume equals  cup, and 1 serving of nuts or seeds equals  cup.  Choose whole grains more often. Have 3 servings per day (a serving is 1 ounce [oz]).  Eat 4 to 5 servings of vegetables  per day. A serving of vegetables is 1 cup of raw leafy vegetables;  cup of raw or cooked cut-up vegetables;  cup of vegetable juice.  Eat 4 to 5 servings of fruit per day. A serving of fruit is 1 medium whole fruit;  cup of dried fruit;  cup of fresh, frozen, or canned fruit;  cup of 100% fruit juice.  Increase your intake of dietary fiber to 20 to 30 grams per day. Insoluble fiber may help lower your risk of heart disease and may help curb your appetite. Soluble fiber binds cholesterol to be removed from the blood. Foods high in soluble fiber are dried beans, citrus fruits, oats, apples, bananas, broccoli, Brussels sprouts, and eggplant.  Try to include foods fortified with plant sterols or stanols, such as yogurt, breads, juices, or margarines. Choose several fortified foods to achieve a daily intake of 2 to 3 grams of plant sterols or stanols.  Foods with omega-3 fats can help reduce your risk of heart disease. Aim to have a 3.5 oz portion of fatty fish twice per week, such as salmon, mackerel, albacore tuna, sardines, lake trout, or herring. If you wish to take a fish oil supplement, choose one that contains 1 gram of both DHA and EPA.  Limit processed meats to 2 servings (3 oz portion) weekly.  Limit the sodium in your diet to 1500 milligrams (mg) per day. If you have high blood pressure, talk to a registered dietitian about a DASH (Dietary Approaches to Stop Hypertension) eating plan.  Limit sweets and beverages with added sugar, such  as soda, to no more than 5 servings per week. One serving is:   1 tablespoon sugar.  1 tablespoon jelly or jam.   cup sorbet.  1 cup lemonade.   cup regular soda. CHOOSING FOODS Starches  Allowed: Breads: All kinds (wheat, rye, raisin, white, oatmeal, [place], [place], and English muffin bread). Low-fat rolls: English muffins, frankfurter and hamburger buns, bagels, pita bread, tortillas (not fried). Pancakes, waffles, biscuits, and muffins made  with recommended oil.  Avoid: Products made with saturated or trans fats, oils, or whole milk products. Butter rolls, cheese breads, croissants. Commercial doughnuts, muffins, sweet rolls, biscuits, waffles, pancakes, store-bought mixes. Crackers  Allowed: Low-fat crackers and snacks: Animal, graham, rye, saltine (with recommended oil, no lard), oyster, and matzo crackers. Bread sticks, melba toast, rusks, flatbread, pretzels, and light popcorn.  Avoid: High-fat crackers: cheese crackers, butter crackers, and those made with coconut, palm oil, or trans fat (hydrogenated oils). Buttered popcorn. Cereals  Allowed: Hot or cold whole-grain cereals.  Avoid: Cereals containing coconut, hydrogenated vegetable fat, or animal fat. Potatoes / Pasta / Rice  Allowed: All kinds of potatoes, rice, and pasta (such as macaroni, spaghetti, and noodles).  Avoid: Pasta or rice prepared with cream sauce or high-fat cheese. Chow mein noodles, [place] fries. Vegetables  Allowed: All vegetables and vegetable juices.  Avoid: Fried vegetables. Vegetables in cream, butter, or high-fat cheese sauces. Limit coconut. Fruit in cream or custard. Protein  Allowed: Limit your intake of meat, seafood, and poultry to no more than 6 oz (cooked weight) per day. All lean, well-trimmed beef, veal, pork, and lamb. All chicken and [place] without skin. All fish and shellfish. Wild game: wild duck, rabbit, pheasant, and venison. Egg whites or low-cholesterol egg substitutes may be used as desired. Meatless dishes: recipes with dried beans, peas, lentils, and tofu (soybean curd). Seeds and nuts: all seeds and most nuts.  Avoid: Prime grade and other heavily marbled and fatty meats, such as short ribs, spare ribs, rib eye roast or steak, frankfurters, sausage, bacon, and high-fat luncheon meats, mutton. Caviar. Commercially fried fish. Domestic duck, goose, venison sausage. Organ meats: liver, gizzard, heart, chitterlings, brains,  kidney, sweetbreads. Dairy  Allowed: Low-fat cheeses: nonfat or low-fat cottage cheese (1% or 2% fat), cheeses made with part skim milk, such as mozzarella, farmers, string, or ricotta. (Cheeses should be labeled no more than 2 to 6 grams fat per oz.). Skim (or 1%) milk: liquid, powdered, or evaporated. Buttermilk made with low-fat milk. Drinks made with skim or low-fat milk or cocoa. Chocolate milk or cocoa made with skim or low-fat (1%) milk. Nonfat or low-fat yogurt.  Avoid: Whole milk cheeses, including colby, cheddar, [place], [address], [place], [place], [place], [address], [place], and blue. Creamed cottage cheese, cream cheese. Whole milk and whole milk products, including buttermilk or yogurt made from whole milk, drinks made from whole milk. Condensed milk, evaporated whole milk, and 2% milk. Soups and Combination Foods  Allowed: Low-fat low-sodium soups: broth, dehydrated soups, homemade broth, soups with the fat removed, homemade cream soups made with skim or low-fat milk. Low-fat spaghetti, lasagna, chili, and Spanish rice if low-fat ingredients and low-fat cooking techniques are used.  Avoid: Cream soups made with whole milk, cream, or high-fat cheese. All other soups. Desserts and Sweets  Allowed: Sherbet, fruit ices, gelatins, meringues, and angel food cake. Homemade desserts with recommended fats, oils, and milk products. Jam, jelly, honey, marmalade, sugars, and syrups. Pure sugar candy, such as gum drops, hard candy, jelly beans, marshmallows,  mints, and small amounts of dark chocolate.  Avoid: Commercially prepared cakes, pies, cookies, frosting, pudding, or mixes for these products. Desserts containing whole milk products, chocolate, coconut, lard, palm oil, or palm kernel oil. Ice cream or ice cream drinks. Candy that contains chocolate, coconut, butter, hydrogenated fat, or unknown ingredients. Buttered syrups. Fats and Oils  Allowed: Vegetable oils: safflower, sunflower,  corn, soybean, cottonseed, sesame, canola, olive, or peanut. Non-hydrogenated margarines. Salad dressing or mayonnaise: homemade or commercial, made with a recommended oil. Low or nonfat salad dressing or mayonnaise.  Limit added fats and oils to 6 to 8 tsp per day (includes fats used in cooking, baking, salads, and spreads on bread). Remember to count the "hidden fats" in foods.  Avoid: Solid fats and shortenings: butter, lard, salt pork, bacon drippings. Gravy containing meat fat, shortening, or suet. Cocoa butter, coconut. Coconut oil, palm oil, palm kernel oil, or hydrogenated oils: these ingredients are often used in bakery products, nondairy creamers, whipped toppings, candy, and commercially fried foods. Read labels carefully. Salad dressings made of unknown oils, sour cream, or cheese, such as blue cheese and Roquefort. Cream, all kinds: half-and-half, light, heavy, or whipping. Sour cream or cream cheese (even if "light" or low-fat). Nondairy cream substitutes: coffee creamers and sour cream substitutes made with palm, palm kernel, hydrogenated oils, or coconut oil. Beverages  Allowed: Coffee (regular or decaffeinated), tea. Diet carbonated beverages, mineral water. Alcohol: Check with your caregiver. Moderation is recommended.  Avoid: Whole milk, regular sodas, and juice drinks with added sugar. Condiments  Allowed: All seasonings and condiments. Cocoa powder. "Cream" sauces made with recommended ingredients.  Avoid: Carob powder made with hydrogenated fats. SAMPLE MENU Breakfast   cup orange juice   cup oatmeal  1 slice toast  1 tsp margarine  1 cup skim milk Lunch  [place] sandwich with 2 oz [place], 2 slices bread  Lettuce and tomato slices  Fresh fruit  Carrot sticks  Coffee or tea Snack  Fresh fruit or low-fat crackers Dinner  3 oz lean ground beef  1 baked potato  1 tsp margarine   cup asparagus  Lettuce salad  1 tbs non-creamy dressing   cup  peach slices  1 cup skim milk Document Released: 06/06/2008 Document Revised: 02/27/2012 Document Reviewed: 11/21/2011 [facility] Patient Information 2013 [place], [place].

## 2012-12-23 NOTE — Assessment & Plan Note (Signed)
Not currently on HAART currently awaiting appt with ID- reports he has called to set up lab work and appt time.

## 2012-12-23 NOTE — Assessment & Plan Note (Signed)
Patient is stable;  Will monitor and make changes as necessary.  

## 2012-12-23 NOTE — Progress Notes (Signed)
Patient ID: Bryan Gilmore, male   DOB: [birth year], 62 y.o.   MRN: [id number]   No Known Allergies  Chief Complaint  Patient presents with  . Medical Managment of Chronic Issues    HPI: Patient is a 62 y.o. male seen in the office today for routine follow up. Still has not seen ID for HAART medications. Still going to dialysis T,T, Sat Currently without complaints   Review of Systems:  Review of Systems  Constitutional: Negative for fever, chills, weight loss (weight gain ), malaise/fatigue and diaphoresis.  HENT: Negative for ear pain, nosebleeds, congestion and ear discharge.   Eyes: Negative for blurred vision, double vision, pain and redness.  Respiratory: Negative for cough, sputum production and shortness of breath.   Cardiovascular: Negative for chest pain, palpitations and leg swelling.  Gastrointestinal: Negative for heartburn, nausea, vomiting, abdominal pain, diarrhea and constipation.  Genitourinary: Positive for urgency (chronic). Negative for dysuria.  Musculoskeletal: Negative.   Skin: Negative.   Neurological: Positive for tingling (on gabapentin which helps ). Negative for weakness and headaches.  Psychiatric/Behavioral: Negative for depression. The patient is not nervous/anxious and does not have insomnia.      Past Medical History  Diagnosis Date  . HIV (human immunodeficiency virus infection)   . Sepsis(995.91)   . Gout   . Hepatitis C   . Fournier's gangrene   . Anemia   . Severe protein-calorie malnutrition   . Alcohol abuse   . End stage renal disease     hemodialysis tues, thurs, sat  . Dysrhythmia     AFib, not a candidate for coumadin , dr [place] signed off on pt  . Hypertension   . Colostomy status   . Sebaceous cyst   . Cough   . Unspecified constipation   . Gout, unspecified   . GERD (gastroesophageal reflux disease)   . Peripheral vascular disease, unspecified   . Atrial fibrillation   . Ulcer of lower limb, unspecified   . Ulcer of lower  limb, unspecified   . Human immunodeficiency virus (HIV) disease   . Other severe protein-calorie malnutrition   . Disorders of phosphorus metabolism   . Hypopotassemia   . Anemia in chronic kidney disease(285.21)   . Anemia in chronic kidney disease(285.21)   . Anemia, unspecified   . Hepatitis, unspecified   . End stage renal disease   . Altered mental status   . Hemoptysis, unspecified   . Acute respiratory failure   . Gangrene   . Sepsis(995.91)    Past Surgical History  Procedure Laterality Date  . Cystoscopy with urethral dilatation  02/17/2012    Procedure: CYSTOSCOPY WITH URETHRAL DILATATION;  Surgeon: Martina Sinner, MD;  Location: [facility];  Service: Urology;  Laterality: N/A;  retrograde urethragram, placement of foley catheter exam under anesthesia   . Insertion of dialysis catheter  02/20/2012    Procedure: INSERTION OF DIALYSIS CATHETER;  Surgeon: Chuck Hint, MD;  Location: [facility] OR;  Service: Vascular;  Laterality: N/A;  . Irrigation and debridement abscess  02/29/2012    Procedure: IRRIGATION AND DEBRIDEMENT ABSCESS;  Surgeon: Martina Sinner, MD;  Location: [facility] OR;  Service: Urology;  Laterality: N/A;  I&D of scrotum abcess  . Insertion of dialysis catheter  03/29/2012    Procedure: INSERTION OF DIALYSIS CATHETER;  Surgeon: Larina Earthly, MD;  Location: [facility] OR;  Service: Vascular;  Laterality: N/A;  Insertion tunneled dialysis catheter right IJ  . Av fistula placement  04/12/2012  Procedure: INSERTION OF ARTERIOVENOUS (AV) GORE-TEX GRAFT ARM;  Surgeon: Larina Earthly, MD;  Location: [facility] OR;  Service: Vascular;  Laterality: Left;  insertion of 6x50 stretch goretex graft   . Thrombectomy and revision of arterioventous (av) goretex  graft  07/15/2012    Procedure: THROMBECTOMY AND REVISION OF ARTERIOVENTOUS (AV) GORETEX  GRAFT;  Surgeon: Sherren Kerns, MD;  Location: [facility] OR;  Service: Vascular;  Laterality: Left;  . Urethrogram  07/30/2012    Procedure: URETHROGRAM;   Surgeon: Martina Sinner, MD;  Location: [facility];  Service: Urology;  Laterality: N/A;  cystogram  . Cystoscopy  07/30/2012    Procedure: CYSTOSCOPY;  Surgeon: Martina Sinner, MD;  Location: [facility];  Service: Urology;  Laterality: Bilateral;  retrograde  . Insertion of dialysis catheter  08/04/2012    Procedure: INSERTION OF DIALYSIS CATHETER;  Surgeon: Chuck Hint, MD;  Location: [facility] OR;  Service: Vascular;  Laterality: Right;  right intrajugular hemodialysis catheter insertion  . Av fistula placement  08/14/2012    Procedure: INSERTION OF ARTERIOVENOUS (AV) GORE-TEX GRAFT ARM;  Surgeon: Pryor Ochoa, MD;  Location: [facility] OR;  Service: Vascular;  Laterality: Left;  . Colostomy  02/2012    d/t an infection  . Av fistula placement Right 11/08/2012    Procedure: ARTERIOVENOUS (AV) FISTULA CREATION;  Surgeon: Chuck Hint, MD;  Location: [facility] OR;  Service: Vascular;  Laterality: Right;  . Hernia repair      Umbilical  . Surgery for fournier's gangrene  02/2012   Social History:   reports that he has never smoked. He has never used smokeless tobacco. He reports that he does not drink alcohol or use illicit drugs.  Family History  Problem Relation Age of Onset  . Hypertension Mother   . Hypertension Father     Medications: Patient's Medications  New Prescriptions   No medications on file  Previous Medications   ALLOPURINOL (ZYLOPRIM) 300 MG TABLET    Take one tablet once a day for gout   CALCIUM ACETATE (PHOSLO) 667 MG CAPSULE    Take 667 mg by mouth 6 (six) times daily. Take 2 capsules by mouth 3 times daily with meals and 1 capsule by mouth 3 times daily with snacks.   GABAPENTIN (NEURONTIN) 100 MG CAPSULE    TAKE ONE CAPSULE BY MOUTH THREE TIMES DAILY   LAMIVUDINE (EPIVIR) 100 MG TABLET    Take 0.5 tablets (50 mg total) by mouth daily.   LOPINAVIR-RITONAVIR (KALETRA) 200-50 MG PER TABLET    Take 2 tablets by mouth 2 (two) times daily.   RALTEGRAVIR (ISENTRESS) 400 MG  TABLET    Take 1 tablet (400 mg total) by mouth 2 (two) times daily.   ZIDOVUDINE (RETROVIR) 300 MG TABLET    Take 1 tablet (300 mg total) by mouth daily.  Modified Medications   No medications on file  Discontinued Medications   No medications on file     Physical Exam: Physical Exam  Constitutional: He is oriented to person, place, and time. He appears well-developed and well-nourished. No distress.  HENT:  Head: Normocephalic and atraumatic.  Mouth/Throat: Normal dentition (poor denition ).  Eyes: Conjunctivae and EOM are normal. Pupils are equal, round, and reactive to light.  Neck: Normal range of motion. Neck supple.  Cardiovascular: Normal rate, regular rhythm, normal heart sounds and intact distal pulses.   Pulmonary/Chest: Effort normal and breath sounds normal.  Abdominal: Soft. Bowel sounds are normal.  Colostomy bag intact  Genitourinary: Prostate normal. Guaiac stool: no stool in rectum   Musculoskeletal: Normal range of motion. He exhibits no edema and no tenderness.  Neurological: He is alert and oriented to person, place, and time.  Skin: Skin is warm and dry. He is not diaphoretic.    Filed Vitals:   12/23/12 0847  BP: 118/68  Pulse: 74  Temp: 96.4 F (35.8 C)  Resp: 16  Weight: 219 lb 12.8 oz (99.701 kg)      Labs reviewed: Basic Metabolic Panel:  Recent Labs  16/10/96 1830 02/18/12 0500  02/19/12 0500  02/20/12 0500  03/31/12 0526  04/05/12 1130  04/09/12 0738 04/11/12 0743  04/13/12 0740  07/30/12 0548 08/04/12 0623 08/14/12 0841 11/08/12 1052 11/13/12 1536  NA 132* 135  < > 136  < > 136  < > 138  < >  --   < > 134* 135  < > 136  < > 136 135 142 140 139  K 5.3* 4.0  < > 4.1  < > 4.2  < > 3.6  < >  --   < > 4.1 3.5  < > 4.2  < > 3.4* 4.1 3.7 3.8 3.9  CL 99 92*  < > 97  < > 101  < > 98  < >  --   < > 97 95*  < > 97  < > 93* 94*  --   --  99  CO2 12* 25  < > 30  < > 28  < > 26  < >  --   < > 22 24  < > 25  < > 28 27  --   --  27  GLUCOSE  101* 96  < > 98  < > 114*  < > 90  < >  --   < > 102* 108*  < > 110*  < > 99 100* 104* 92 83  BUN 121* 98*  < > 44*  < > 23  < > 10  < >  --   < > 52* 66*  < > 62*  < > 30* 68*  --   --  34*  CREATININE 8.51* 5.95*  < > 3.02*  < > 2.16*  < > 3.35*  < >  --   < > 5.80* 5.33*  < > 5.07*  < > 3.81* 6.02*  --   --  6.02*  CALCIUM 8.0* 7.8*  < > 8.4  < > 7.9*  < > 9.6  < >  --   < > 9.5  9.2 9.3  8.7  < > 9.7  < > 9.8 9.8  --   --  9.3  MG 2.2 2.1  --  2.4  --  2.4  --   --   --  2.0  --   --   --   --   --   --   --   --   --   --   --   PHOS 9.5* 6.6*  6.6*  < > 4.4  < > 3.1  < > 4.0  < >  --   < > 3.6 5.4*  --  5.9*  --   --   --   --   --   --   TSH  --  2.622  --   --   --   --   --  6.222*  --   --   --   --   --   --   --   --   --   --   --   --   --   < > =  values in this interval not displayed. Liver Function Tests:  Recent Labs  05/22/12 1008 07/30/12 0548 11/13/12 1536  AST 24 52* 32  ALT 17 44 22  ALKPHOS 67 112 110  BILITOT 0.9 1.3* 0.6  PROT 7.4 7.5 7.5  ALBUMIN 3.3* 3.0* 3.9   No results found for this basename: LIPASE, AMYLASE,  in the last 8760 hours  Recent Labs  03/30/12 0928 04/05/12 1135  AMMONIA 15 14   CBC:  Recent Labs  05/22/12 1008  07/30/12 0548 08/04/12 0623 08/14/12 0841 11/08/12 1052 11/13/12 1536  WBC 6.6  --  6.9 6.0  --   --  7.8  NEUTROABS 4.1  --   --  3.8  --   --  5.1  HGB 12.7*  < > 10.9* 10.3* 12.6* 11.2* 11.4*  HCT 39.8  < > 33.5* 31.3* 37.0* 33.0* 33.8*  MCV 103.1*  --  106.3* 105.7*  --   --  102.4*  PLT 258  --  151 230  --   --  243  < > = values in this interval not displayed.    Assessment/Plan GOUT, CHRONIC Patient is stable-- no recurrent gout; continue current regimen. Will monitor and make changes as necessary.   ESRD (end stage renal disease) Stable on dialysis T, T, Saturday. Cont to follow a renal diet with supplements.    Normocytic anemia Patient is stable; Will monitor and make changes as  necessary.   HIV INFECTION Not currently on HAART currently awaiting appt with ID- reports he has called to set up lab work and appt time.      Labs/tests ordered Will schedule EV with fasting lipids before visit.

## 2012-12-23 NOTE — Assessment & Plan Note (Signed)
Patient is stable-- no recurrent gout; continue current regimen. Will monitor and make changes as necessary.

## 2012-12-23 NOTE — Assessment & Plan Note (Signed)
Stable on dialysis T, T, Saturday. Cont to follow a renal diet with supplements.

## 2012-12-24 ENCOUNTER — Encounter: Payer: Self-pay | Admitting: Nurse Practitioner

## 2012-12-26 ENCOUNTER — Ambulatory Visit (INDEPENDENT_AMBULATORY_CARE_PROVIDER_SITE_OTHER): Payer: Medicare Other | Admitting: General Surgery

## 2012-12-26 ENCOUNTER — Encounter (INDEPENDENT_AMBULATORY_CARE_PROVIDER_SITE_OTHER): Payer: Self-pay | Admitting: General Surgery

## 2012-12-26 VITALS — BP 158/64 | HR 72 | Temp 97.3°F | Resp 16 | Ht 71.0 in | Wt 220.0 lb

## 2012-12-26 DIAGNOSIS — Z933 Colostomy status: Secondary | ICD-10-CM | POA: Diagnosis not present

## 2012-12-26 NOTE — Patient Instructions (Signed)
Call us for a return appointment as soon as your colonoscopy is completed

## 2012-12-26 NOTE — Progress Notes (Signed)
Chief complaint: Colostomy  History: Patient is a 62 year old male with multiple medical problems as detailed below. He was hospitalized in June of 2013 448 gangrene. He was cared for on the urology service with multiple debridements and subsequently was transferred to [facility] for continued care. While hospitalized there he apparently had a diverting colostomy. He has made a steady recovery and is now living independently. He desires colostomy reversal. It is not causing any specific complication but he does not like the odor and care, etc.  Past Medical History  Diagnosis Date  . HIV (human immunodeficiency virus infection)   . Sepsis(995.91)   . Gout   . Hepatitis C   . Fournier's gangrene   . Anemia   . Severe protein-calorie malnutrition   . Alcohol abuse   . End stage renal disease     hemodialysis tues, thurs, sat  . Dysrhythmia     AFib, not a candidate for coumadin , dr Swaziland signed off on pt  . Hypertension   . Colostomy status   . Sebaceous cyst   . Cough   . Unspecified constipation   . Gout, unspecified   . GERD (gastroesophageal reflux disease)   . Peripheral vascular disease, unspecified   . Atrial fibrillation   . Ulcer of lower limb, unspecified   . Ulcer of lower limb, unspecified   . Human immunodeficiency virus (HIV) disease   . Other severe protein-calorie malnutrition   . Disorders of phosphorus metabolism   . Hypopotassemia   . Anemia in chronic kidney disease(285.21)   . Anemia in chronic kidney disease(285.21)   . Anemia, unspecified   . Hepatitis, unspecified   . End stage renal disease   . Altered mental status   . Hemoptysis, unspecified   . Acute respiratory failure   . Gangrene   . Sepsis(995.91)    Past Surgical History  Procedure Laterality Date  . Cystoscopy with urethral dilatation  02/17/2012    Procedure: CYSTOSCOPY WITH URETHRAL DILATATION;  Surgeon: Martina Sinner, MD;  Location: [facility] ORS;  Service: Urology;  Laterality:  N/A;  retrograde urethragram, placement of foley catheter exam under anesthesia   . Insertion of dialysis catheter  02/20/2012    Procedure: INSERTION OF DIALYSIS CATHETER;  Surgeon: Chuck Hint, MD;  Location: [facility] OR;  Service: Vascular;  Laterality: N/A;  . Irrigation and debridement abscess  02/29/2012    Procedure: IRRIGATION AND DEBRIDEMENT ABSCESS;  Surgeon: Martina Sinner, MD;  Location: [facility] OR;  Service: Urology;  Laterality: N/A;  I&D of scrotum abcess  . Insertion of dialysis catheter  03/29/2012    Procedure: INSERTION OF DIALYSIS CATHETER;  Surgeon: Larina Earthly, MD;  Location: [facility] OR;  Service: Vascular;  Laterality: N/A;  Insertion tunneled dialysis catheter right IJ  . Av fistula placement  04/12/2012    Procedure: INSERTION OF ARTERIOVENOUS (AV) GORE-TEX GRAFT ARM;  Surgeon: Larina Earthly, MD;  Location: [facility] OR;  Service: Vascular;  Laterality: Left;  insertion of 6x50 stretch goretex graft   . Thrombectomy and revision of arterioventous (av) goretex  graft  07/15/2012    Procedure: THROMBECTOMY AND REVISION OF ARTERIOVENTOUS (AV) GORETEX  GRAFT;  Surgeon: Sherren Kerns, MD;  Location: [facility] OR;  Service: Vascular;  Laterality: Left;  . Urethrogram  07/30/2012    Procedure: URETHROGRAM;  Surgeon: Martina Sinner, MD;  Location: [facility];  Service: Urology;  Laterality: N/A;  cystogram  . Cystoscopy  07/30/2012    Procedure: CYSTOSCOPY;  Surgeon: Martina Sinner, MD;  Location: [facility];  Service: Urology;  Laterality: Bilateral;  retrograde  . Insertion of dialysis catheter  08/04/2012    Procedure: INSERTION OF DIALYSIS CATHETER;  Surgeon: Chuck Hint, MD;  Location: [facility] OR;  Service: Vascular;  Laterality: Right;  right intrajugular hemodialysis catheter insertion  . Av fistula placement  08/14/2012    Procedure: INSERTION OF ARTERIOVENOUS (AV) GORE-TEX GRAFT ARM;  Surgeon: Pryor Ochoa, MD;  Location: [facility] OR;  Service: Vascular;  Laterality: Left;  . Colostomy   02/2012    d/t an infection  . Av fistula placement Right 11/08/2012    Procedure: ARTERIOVENOUS (AV) FISTULA CREATION;  Surgeon: Chuck Hint, MD;  Location: [facility] OR;  Service: Vascular;  Laterality: Right;  . Hernia repair      Umbilical  . Surgery for fournier's gangrene  02/2012   Current Outpatient Prescriptions  Medication Sig Dispense Refill  . allopurinol (ZYLOPRIM) 300 MG tablet Take 300 mg by mouth daily.      Marland Kitchen b complex-vitamin c-folic acid (NEPHRO-VITE) 0.8 MG TABS Take 0.8 mg by mouth at bedtime.      . calcium acetate (PHOSLO) 667 MG capsule Take 667-1,334 mg by mouth 6 (six) times daily. Take 2 capsules by mouth 3 times daily with meals and 1 capsule by mouth 3 times daily with snacks.      . gabapentin (NEURONTIN) 100 MG capsule Take 100 mg by mouth 3 (three) times daily.      Marland Kitchen lamivudine (EPIVIR) 100 MG tablet Take 0.5 tablets (50 mg total) by mouth daily.  30 tablet  6  . lopinavir-ritonavir (KALETRA) 200-50 MG per tablet Take 2 tablets by mouth 2 (two) times daily.  120 tablet  11  . raltegravir (ISENTRESS) 400 MG tablet Take 1 tablet (400 mg total) by mouth 2 (two) times daily.  60 tablet  11  . zidovudine (RETROVIR) 300 MG tablet Take 1 tablet (300 mg total) by mouth daily.  30 tablet  11   No current facility-administered medications for this visit.   No Known Allergies History  Substance Use Topics  . Smoking status: Never Smoker   . Smokeless tobacco: Never Used  . Alcohol Use: No     Comment: 9 months   Exam: BP 158/64  Pulse 72  Temp(Src) 97.3 F (36.3 C) (Temporal)  Resp 16  Ht 5\' 11"  (1.803 m)  Wt 220 lb (99.791 kg)  BMI 30.7 kg/m2 General: Alert somewhat overweight Afro-American male Skin: Healed small wounds over both lower extremities. No open wounds or rash infection HEENT: Sclerae nonicteric. Oropharynx clear with poor dentition. No masses. Lungs: Clear equal breath sounds without increased work of breathing Cardiac: Irregular rhythm.  Soft systolic murmur. No edema. Abdomen: Colostomy left lower quadrant which appears to be an end colostomy. Midline incision well-healed. Soft and nontender. No masses or organomegaly. Rectal: Normal tone, no masses Extremities: Functioning fistula right arm. No edema. Neurologic: He is alert and fully oriented. Gait normal.  Assessment and plan: 61 year old male with multiple medical problems including end-stage renal disease, HIV status and history of encephalopathy that appears resolved. He has had a diverting colostomy secondary to Fournier's gangrene. He strongly desires colostomy reversal. I discussed with him that this is a major surgery with risks of infection and cardiopulmonary complications and even some risk of death. We discussed that he would be at increased risk of infectious complications based on his renal disease and HIV status. After discussion  he understands all these issues and feels very strongly that he wants his colostomy reversed. I do not see any absolute contraindications. He's never had a colonoscopy I would like to have his colon evaluated prior to surgery. We will need to obtain his operative note to see exactly what was done. Like to clear this with Dr. Rochele Raring and Dr Lacy Duverney as well prior to surgery. I'll see him back following this.

## 2012-12-30 ENCOUNTER — Other Ambulatory Visit: Payer: Self-pay | Admitting: *Deleted

## 2012-12-30 ENCOUNTER — Encounter (INDEPENDENT_AMBULATORY_CARE_PROVIDER_SITE_OTHER): Payer: Self-pay | Admitting: General Surgery

## 2012-12-30 ENCOUNTER — Telehealth (INDEPENDENT_AMBULATORY_CARE_PROVIDER_SITE_OTHER): Payer: Self-pay | Admitting: General Surgery

## 2012-12-30 MED ORDER — OSTOMY SUPPLIES KIT: PACK | Status: DC

## 2012-12-30 MED ORDER — SKIN PREP WIPES MISC: Status: DC

## 2012-12-30 NOTE — Telephone Encounter (Signed)
Spoke with patient he is aware of test on 01/03/13 @9am  he was instructed to pick up lo so prep at [facility] .

## 2013-01-03 ENCOUNTER — Ambulatory Visit
Admission: RE | Admit: 2013-01-03 | Discharge: 2013-01-03 | Disposition: A | Discharge status: Home / Self Care | Payer: Medicare Other | Source: Ambulatory Visit | Attending: General Surgery | Admitting: General Surgery

## 2013-01-03 DIAGNOSIS — Z933 Colostomy status: Secondary | ICD-10-CM

## 2013-01-03 DIAGNOSIS — Z01818 Encounter for other preprocedural examination: Secondary | ICD-10-CM | POA: Diagnosis not present

## 2013-01-06 ENCOUNTER — Other Ambulatory Visit: Payer: Self-pay | Admitting: *Deleted

## 2013-01-06 ENCOUNTER — Encounter (HOSPITAL_COMMUNITY)
Admission: RE | Disposition: A | Discharge status: Home / Self Care | Payer: Self-pay | Source: Ambulatory Visit | Attending: Vascular Surgery

## 2013-01-06 ENCOUNTER — Ambulatory Visit (HOSPITAL_COMMUNITY)
Admission: RE | Admit: 2013-01-06 | Discharge: 2013-01-06 | Disposition: A | Discharge status: Home / Self Care | Payer: BC Managed Care – PPO | Source: Ambulatory Visit | Attending: Vascular Surgery | Admitting: Vascular Surgery

## 2013-01-06 ENCOUNTER — Telehealth: Payer: Self-pay | Admitting: Vascular Surgery

## 2013-01-06 DIAGNOSIS — I4891 Unspecified atrial fibrillation: Secondary | ICD-10-CM | POA: Insufficient documentation

## 2013-01-06 DIAGNOSIS — D631 Anemia in chronic kidney disease: Secondary | ICD-10-CM | POA: Insufficient documentation

## 2013-01-06 DIAGNOSIS — M109 Gout, unspecified: Secondary | ICD-10-CM | POA: Diagnosis not present

## 2013-01-06 DIAGNOSIS — I871 Compression of vein: Secondary | ICD-10-CM | POA: Diagnosis not present

## 2013-01-06 DIAGNOSIS — E876 Hypokalemia: Secondary | ICD-10-CM | POA: Diagnosis not present

## 2013-01-06 DIAGNOSIS — D649 Anemia, unspecified: Secondary | ICD-10-CM | POA: Diagnosis not present

## 2013-01-06 DIAGNOSIS — B20 Human immunodeficiency virus [HIV] disease: Secondary | ICD-10-CM | POA: Insufficient documentation

## 2013-01-06 DIAGNOSIS — N186 End stage renal disease: Secondary | ICD-10-CM | POA: Diagnosis not present

## 2013-01-06 DIAGNOSIS — K219 Gastro-esophageal reflux disease without esophagitis: Secondary | ICD-10-CM | POA: Diagnosis not present

## 2013-01-06 DIAGNOSIS — B1920 Unspecified viral hepatitis C without hepatic coma: Secondary | ICD-10-CM | POA: Diagnosis not present

## 2013-01-06 DIAGNOSIS — T82898A Other specified complication of vascular prosthetic devices, implants and grafts, initial encounter: Secondary | ICD-10-CM | POA: Diagnosis not present

## 2013-01-06 DIAGNOSIS — I120 Hypertensive chronic kidney disease with stage 5 chronic kidney disease or end stage renal disease: Secondary | ICD-10-CM | POA: Diagnosis not present

## 2013-01-06 DIAGNOSIS — Y832 Surgical operation with anastomosis, bypass or graft as the cause of abnormal reaction of the patient, or of later complication, without mention of misadventure at the time of the procedure: Secondary | ICD-10-CM | POA: Insufficient documentation

## 2013-01-06 DIAGNOSIS — I739 Peripheral vascular disease, unspecified: Secondary | ICD-10-CM | POA: Diagnosis not present

## 2013-01-06 DIAGNOSIS — Z48812 Encounter for surgical aftercare following surgery on the circulatory system: Secondary | ICD-10-CM

## 2013-01-06 HISTORY — PX: FISTULOGRAM: SHX5832

## 2013-01-06 LAB — POCT I-STAT, CHEM 8
Chloride: 103 mEq/L (ref 96–112)
Glucose, Bld: 96 mg/dL (ref 70–99)
HCT: 38 % — ABNORMAL LOW (ref 39.0–52.0)
Hemoglobin: 12.9 g/dL — ABNORMAL LOW (ref 13.0–17.0)
Potassium: 4 mEq/L (ref 3.5–5.1)

## 2013-01-06 SURGERY — FISTULOGRAM
Anesthesia: LOCAL | Laterality: Right

## 2013-01-06 MED ORDER — HEPARIN SODIUM (PORCINE) 1000 UNIT/ML IJ SOLN
INTRAMUSCULAR | Status: AC
  Filled 2013-01-06: qty 1

## 2013-01-06 MED ORDER — SODIUM CHLORIDE 0.9 % IV SOLN: 250.0000 mL | INTRAVENOUS | Status: DC | PRN

## 2013-01-06 MED ORDER — LIDOCAINE HCL (PF) 1 % IJ SOLN
INTRAMUSCULAR | Status: AC
  Filled 2013-01-06: qty 30

## 2013-01-06 MED ORDER — SODIUM CHLORIDE 0.9 % IJ SOLN: 3.0000 mL | Freq: Two times a day (BID) | INTRAMUSCULAR | Status: DC

## 2013-01-06 MED ORDER — SODIUM CHLORIDE 0.9 % IJ SOLN: 3.0000 mL | INTRAMUSCULAR | Status: DC | PRN

## 2013-01-06 NOTE — Telephone Encounter (Signed)
Message copied by Margaretmary Eddy on Mon Jan 06, 2013 11:43 AM ------      Message from: Phillips Odor      Created: Mon Jan 06, 2013 10:25 AM      Regarding: FW: charge                   ----- Message -----         From: Chuck Hint, MD         Sent: 01/06/2013  10:15 AM           To: Reuel Derby, Melene Plan, RN, #      Subject: charge                                                   This patient had ultrasound-guided access to his right radiocephalic AV fistula with a fistulogram and venoplasty of 3 stenoses. He'll need a follow up visit with the duplex of his fistula in approximately 3-4 weeks.thanks CSD ------

## 2013-01-06 NOTE — Telephone Encounter (Signed)
LVM for pt re fu appt, mailed letter - Marylu Lund

## 2013-01-06 NOTE — H&P (Signed)
[facility] [place]  Patient name: Bryan Gilmore. MRN: [id number] DOB: [birth year] Sex: male  REASON FOR CONSULT: poorly functioning right radial cephalic AV fistula  HPI: Bryan Gilmore. is a 62 y.o. male who had a right radial cephalic AV fistula placed on 13/24/4010. He had a small 2.5 mm artery and a 3.5 mm cephalic vein. He has a functioning catheter. His fistula has not been maturing adequately he is brought in for a fistulogram. He dialyzes on Tuesdays Thursdays and Saturdays.  Past Medical History  Diagnosis Date  . HIV (human immunodeficiency virus infection)   . Sepsis(995.91)   . Gout   . Hepatitis C   . Fournier's gangrene   . Anemia   . Severe protein-calorie malnutrition   . Alcohol abuse   . End stage renal disease     hemodialysis tues, thurs, sat  . Dysrhythmia     AFib, not a candidate for coumadin , dr Swaziland signed off on pt  . Hypertension   . Colostomy status   . Sebaceous cyst   . Cough   . Unspecified constipation   . Gout, unspecified   . GERD (gastroesophageal reflux disease)   . Peripheral vascular disease, unspecified   . Atrial fibrillation   . Ulcer of lower limb, unspecified   . Ulcer of lower limb, unspecified   . Human immunodeficiency virus (HIV) disease   . Other severe protein-calorie malnutrition   . Disorders of phosphorus metabolism   . Hypopotassemia   . Anemia in chronic kidney disease(285.21)   . Anemia in chronic kidney disease(285.21)   . Anemia, unspecified   . Hepatitis, unspecified   . End stage renal disease   . Altered mental status   . Hemoptysis, unspecified   . Acute respiratory failure   . Gangrene   . Sepsis(995.91)     Family History  Problem Relation Age of Onset  . Hypertension Mother   . Hypertension Father   . Cancer Cousin     breast   SOCIAL HISTORY: History  Substance Use Topics  . Smoking status: Never Smoker   . Smokeless tobacco: Never Used  . Alcohol Use: No   Comment: 9 months   No Known Allergies  Current Facility-Administered Medications  Medication Dose Route Frequency Provider Last Rate Last Dose  . 0.9 %  sodium chloride infusion  250 mL Intravenous PRN Chuck Hint, MD      . sodium chloride 0.9 % injection 3 mL  3 mL Intravenous Q12H Chuck Hint, MD      . sodium chloride 0.9 % injection 3 mL  3 mL Intravenous PRN Chuck Hint, MD       REVIEW OF SYSTEMS: Arly.Keller ] denotes positive finding; [  ] denotes negative finding CARDIOVASCULAR:  [ ]  chest pain   [ ]  chest pressure   [ ]  palpitations   [ ]  orthopnea   [ ]  dyspnea on exertion   [ ]  claudication   [ ]  rest pain   [ ]  DVT   [ ]  phlebitis PULMONARY:   [ ]  productive cough   [ ]  asthma   [ ]  wheezing NEUROLOGIC:   [ ]  weakness  [ ]  paresthesias  [ ]  aphasia  [ ]  amaurosis  [ ]  dizziness HEMATOLOGIC:   [ ]  bleeding problems   [ ]  clotting disorders MUSCULOSKELETAL:  [ ]  joint pain   [ ]  joint swelling [ ]  leg swelling GASTROINTESTINAL: [ ]   blood in stool  [ ]   hematemesis GENITOURINARY:  [ ]   dysuria  [ ]   hematuria PSYCHIATRIC:  [ ]  history of major depression INTEGUMENTARY:  [ ]  rashes  [ ]  ulcers CONSTITUTIONAL:  [ ]  fever   [ ]  chills  PHYSICAL EXAM: Filed Vitals:   01/06/13 0822  BP: 129/84  Pulse: 69  Temp: 97 F (36.1 C)  TempSrc: Oral  Resp: 18  Height: 5\' 11"  (1.803 m)  Weight: 220 lb (99.791 kg)  SpO2: 100%   Body mass index is 30.7 kg/(m^2). GENERAL: The patient is a well-nourished male, in no acute distress. The vital signs are documented above. CARDIOVASCULAR: There is a regular rate and rhythm.  PULMONARY: There is good air exchange bilaterally without wheezing or rales. ABDOMEN: Soft and non-tender with normal pitched bowel sounds.  MUSCULOSKELETAL: There are no major deformities or cyanosis. NEUROLOGIC: No focal weakness or paresthesias are detected. SKIN: There are no ulcers or rashes noted. PSYCHIATRIC: The patient has a normal  affect.  DATA:  Lab Results  Component Value Date   WBC 7.8 11/13/2012   HGB 11.4* 11/13/2012   HCT 33.8* 11/13/2012   MCV 102.4* 11/13/2012   PLT 243 11/13/2012   Lab Results  Component Value Date   NA 139 11/13/2012   K 3.9 11/13/2012   CL 99 11/13/2012   CO2 27 11/13/2012   Lab Results  Component Value Date   CREATININE 6.02* 11/13/2012   Lab Results  Component Value Date   INR 1.00 08/14/2012   INR 1.06 07/15/2012   INR 1.37 02/21/2012   DUPLEX RIGHT FOREARM AV FISTULA: He has a stenosis near the arterial anastomosis and also stenosis in the mid upper arm.  MEDICAL ISSUES: Plan is for fistulogram of right radiocephalic AV fistula and possible venoplasty of stenoses. I have discussed the procedure and risks with the patient and he is agreeable to proceed. All of his questions were answered.  Venba Zenner S [facility] [place] Beeper: [phone]

## 2013-01-06 NOTE — Op Note (Signed)
PATIENT: Bryan Gilmore.   MRN: [id number] DOB: 23-Jul-1951    DATE OF PROCEDURE: 01/06/2013  INDICATIONS: Gianlucca Szymborski. is a 62 y.o. male with a poorly functioning right radiocephalic AV fistula.  PROCEDURE:  1. Ultrasound-guided access to right radiocephalic fistula x2 2. Antegrade cannulation of right radiocephalic fistula with fistulogram 3. venoplasty of cephalic vein with 5 mm x 2 cm Mustang balloon. 4. Retrograde cannulation of right radiocephalic AV fistula with venoplasty of cephalic vein using 4 mm x 2 cm balloon.  SURGEON: Di Kindle. Edilia Bo, MD, FACS  ANESTHESIA: local   EBL: minimal  TECHNIQUE: The patient was brought to the peripheral vascular lab. The right upper extremity was prepped and draped in the usual sterile fashion. Under ultrasound guidance the proximal fistula was cannulated after the skin was anesthetized. A micropuncture sheath was introduced over a micropuncture wire. A fistulogram was obtained. There was a stenosis in the midportion the form. I elected to address this with venoplasty. The micropuncture sheath was exchanged for a 6 French sheath. Patient received 3000 units of IV heparin. After measurements were made, a 5 mm x 2 cm balloon was selected and positioned across the stenosis. It was inflated to 12 atmospheres for 1 minute. There was a good result. In order to access the area of narrowing in the proximal fistula the current cannulation site was sutured and pressure held for hemostasis. I then had to recannulate the fistula in the upper forearm to allow access to the proximal fistula near the wrist. Under ultrasound guidance, after the skin was anesthetized, the micropuncture sheath was introduced over a micropuncture wire. The 6 French sheath was then exchanged over a wire. A 4 mm x 2 cm balloon was selected this was positioned across the 2 areas of stenosis the proximal fistula. The balloon was inflated in both areas to 12 atmospheres with an excellent  result. The patient still showed good result.  FINDINGS:  1. Successful venoplasty of 3 stenoses of the right radiocephalic fistula as described above the 2. The upper arm cephalic vein is fairly small. The forearm cephalic vein empties into the basilic system with a branch off of this which forms the upper arm cephalic vein. No central venous stenosis is noted.  CLINICAL NOTE: The fistula can be used in approximately 2 weeks.  Waverly Ferrari, MD, FACS [facility]  DATE OF DICTATION:   01/06/2013

## 2013-01-07 ENCOUNTER — Telehealth (INDEPENDENT_AMBULATORY_CARE_PROVIDER_SITE_OTHER): Payer: Self-pay

## 2013-01-07 NOTE — Telephone Encounter (Signed)
Called patient to give results from Barium Enema through Colostomy (X-ray is normal)  Patient states he's ready to have the surgery.  Phone call was disconnected possibly due to poor reception.

## 2013-01-15 ENCOUNTER — Encounter: Payer: Self-pay | Admitting: Internal Medicine

## 2013-01-15 ENCOUNTER — Ambulatory Visit (INDEPENDENT_AMBULATORY_CARE_PROVIDER_SITE_OTHER): Payer: Medicare Other | Admitting: Internal Medicine

## 2013-01-15 VITALS — BP 137/87 | HR 76 | Temp 97.4°F | Wt 221.0 lb

## 2013-01-15 DIAGNOSIS — B20 Human immunodeficiency virus [HIV] disease: Secondary | ICD-10-CM

## 2013-01-15 DIAGNOSIS — Z23 Encounter for immunization: Secondary | ICD-10-CM

## 2013-01-15 DIAGNOSIS — Z0000 Encounter for general adult medical examination without abnormal findings: Secondary | ICD-10-CM | POA: Diagnosis not present

## 2013-01-15 DIAGNOSIS — K739 Chronic hepatitis, unspecified: Secondary | ICD-10-CM

## 2013-01-15 NOTE — Progress Notes (Signed)
[facility] NOTE  RFV: routine visit Subjective:    Patient ID: Bryan Wild., male    DOB: 02/17/1951, 62 y.o.   MRN: [id number]  HPI 62yo Male with HIV-HCV, genotype 1b, as well ESRD, on HD  tues-thru,CD 4 count of 450/VL 1950 ( in march 2014) , had delay in getting back on ART. He was restarted on his old regimen with  raltegravir, lopinavir/rit, lamivudine, zidovudine for the past 3 wks now. The patient has been taking his meds regularly. Still urinates. Had colonoscopy last week and now looking forward going back to surgery to have eval for colostomy reversed  Current Outpatient Prescriptions on File Prior to Visit  Medication Sig Dispense Refill  . allopurinol (ZYLOPRIM) 300 MG tablet Take 300 mg by mouth daily.      Marland Kitchen b complex-vitamin c-folic acid (NEPHRO-VITE) 0.8 MG TABS Take 0.8 mg by mouth at bedtime.      . gabapentin (NEURONTIN) 100 MG capsule Take 100 mg by mouth 3 (three) times daily.      Marland Kitchen lamivudine (EPIVIR) 100 MG tablet Take 0.5 tablets (50 mg total) by mouth daily.  30 tablet  6  . lopinavir-ritonavir (KALETRA) 200-50 MG per tablet Take 2 tablets by mouth 2 (two) times daily.  120 tablet  11  . raltegravir (ISENTRESS) 400 MG tablet Take 1 tablet (400 mg total) by mouth 2 (two) times daily.  60 tablet  11  . zidovudine (RETROVIR) 300 MG tablet Take 1 tablet (300 mg total) by mouth daily.  30 tablet  11  . calcium acetate (PHOSLO) 667 MG capsule Take 667-1,334 mg by mouth 6 (six) times daily. Take 2 capsules by mouth 3 times daily with meals and 1 capsule by mouth 3 times daily with snacks.      Sherren Mocha Supplies (SKIN PREP WIPES) MISC Use as Directed  50 each  6  . Ostomy Supplies KIT Use as Directed  1 each  6   No current facility-administered medications on file prior to visit.   Active Ambulatory Problems    Diagnosis Date Noted  . HIV INFECTION 09/15/2010  . HEPATITIS C 10/07/2010  . GOUT, CHRONIC 10/07/2010  . 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  Normocytic anemia 02/17/2012  . ESRD (end stage renal disease) 02/29/2012  . Anemia of chronic renal failure 02/29/2012  . Hyperkalemia 03/29/2012  . Secondary hyperparathyroidism (of renal origin) 03/29/2012  . Fever 04/01/2012  . Delirium 04/01/2012  . Atrial fibrillation, permanent 04/04/2012  . Alcohol abuse   . Metabolic encephalopathy 04/06/2012  . Fournier's gangrene in male 06/05/2012  . End stage renal disease 10/30/2012  . Aftercare following surgery of the circulatory system, NEC 12/18/2012  . Colostomy status 12/26/2012   Resolved Ambulatory Problems    Diagnosis Date Noted  . Decreased libido 10/07/2010  . Sepsis secondary to suspected Fornier's gangrene 02/17/2012  . ARF (acute renal failure) in the setting of stage III CKD 02/17/2012  . Metabolic acidosis 02/17/2012  . Hyperkalemia 02/17/2012  . Hyponatremia 02/17/2012  . Acute respiratory failure 02/17/2012   Past Medical History  Diagnosis Date  . HIV (human immunodeficiency virus infection)   . Gout   . Hepatitis C   . Fournier's gangrene   . Anemia   . Severe protein-calorie malnutrition   . Dysrhythmia   . Hypertension   . Sebaceous cyst   . Cough   . Unspecified constipation   . Gout, unspecified   . GERD (gastroesophageal reflux disease)   .  Peripheral vascular disease, unspecified   . Atrial fibrillation   . Ulcer of lower limb, unspecified   . Ulcer of lower limb, unspecified   . Other severe protein-calorie malnutrition   . Disorders of phosphorus metabolism   . Hypopotassemia   . Anemia in chronic kidney disease(285.21)   . Anemia in chronic kidney disease(285.21)   . Anemia, unspecified   . Hepatitis, unspecified   . Altered mental status   . Hemoptysis, unspecified   . Gangrene    Family and social hx unchanged since last visit  Review of Systems  Constitutional: Negative for fever, chills, diaphoresis, activity change, appetite change, fatigue and unexpected weight change.   HENT: Negative for congestion, sore throat, rhinorrhea, sneezing, trouble swallowing and sinus pressure.  Eyes: Negative for photophobia and visual disturbance.  Respiratory: Negative for cough, chest tightness, shortness of breath, wheezing and stridor.  Cardiovascular: Negative for chest pain, palpitations and leg swelling.  Gastrointestinal: Negative for nausea, vomiting, abdominal pain, diarrhea, constipation, blood in stool, abdominal distention and anal bleeding.  Genitourinary: Negative for dysuria, hematuria, flank pain and difficulty urinating.  Musculoskeletal: Negative for myalgias, back pain, joint swelling, arthralgias and gait problem.  Skin: Negative for color change, pallor, rash and wound.  Neurological: Negative for dizziness, tremors, weakness and light-headedness.  Hematological: Negative for adenopathy. Does not bruise/bleed easily.  Psychiatric/Behavioral: Negative for behavioral problems, confusion, sleep disturbance, dysphoric mood, decreased concentration and agitation.       Objective:   Physical Exam BP 137/87  Pulse 76  Temp(Src) 97.4 F (36.3 C) (Oral)  Wt 221 lb (100.245 kg)  BMI 30.84 kg/m2 Physical Exam  Constitutional: He is oriented to person, place, and time. He appears well-developed and well-nourished. No distress.  HENT:  Mouth/Throat: Oropharynx is clear and moist. No oropharyngeal exudate.  Cardiovascular: Normal rate, regular rhythm and normal heart sounds. Exam reveals no gallop and no friction rub.  No murmur heard.  Pulmonary/Chest: Effort normal and breath sounds normal. No respiratory distress. He has no wheezes.  Abdominal: Soft. Bowel sounds are normal. He exhibits no distension. There is no tenderness. LLQ colostomy looks good  Lymphadenopathy:  He has no cervical adenopathy.  Neurological: He is alert and oriented to person, place, and time.  Skin: Skin is warm and dry. No rash noted. No erythema.  Psychiatric: He has a normal mood  and affect. His behavior is normal.       Assessment & Plan:  HIV = will check viral load in 3 wk  to see if he is getting adequate viral suppression. Will likely need to change kaletra to darunavir/r  Hep C management = will check CMP, cbc and coags today. APRI score of 0.599 unlikely to have cirrhosis. Refer to hepatologist for annual eval. Will hold off on hep c viral load until next blood draw.  Health maintenance = will get double dose hep B#2 today, already immunized ag hep A  colonostomy revision = looking forward to having revision by Dr. Johna Sheriff  rtc in 3 wks for labs and assess for change off of [place]

## 2013-01-16 ENCOUNTER — Telehealth (INDEPENDENT_AMBULATORY_CARE_PROVIDER_SITE_OTHER): Payer: Self-pay

## 2013-01-16 ENCOUNTER — Other Ambulatory Visit (INDEPENDENT_AMBULATORY_CARE_PROVIDER_SITE_OTHER): Payer: Self-pay | Admitting: General Surgery

## 2013-01-16 ENCOUNTER — Telehealth (INDEPENDENT_AMBULATORY_CARE_PROVIDER_SITE_OTHER): Payer: Self-pay | Admitting: General Surgery

## 2013-01-16 NOTE — Telephone Encounter (Signed)
Call the patient and discussed further planned colostomy takedown. He has been seen by Dr. Drue Second who feels he is okay for surgery. He does not feel he needs to see me again preoperatively and states he understands the procedure and risks as we had previously discussed. I will go ahead and get him scheduled for colostomy takedown.

## 2013-01-16 NOTE — Telephone Encounter (Signed)
1 day bowel prep w/antibiotics & nulytely mailed to patient home address as listed in EPIC.

## 2013-01-20 ENCOUNTER — Telehealth (INDEPENDENT_AMBULATORY_CARE_PROVIDER_SITE_OTHER): Payer: Self-pay | Admitting: *Deleted

## 2013-01-20 ENCOUNTER — Other Ambulatory Visit: Payer: Self-pay | Admitting: Nurse Practitioner

## 2013-01-20 NOTE — Telephone Encounter (Signed)
[facility] called to clarify dosage of Erythromycin and Neomycin.  Spoke with Hoxworth MD who stated he wanted each to be 1 gram x3 each.

## 2013-01-28 ENCOUNTER — Encounter: Payer: Self-pay | Admitting: Vascular Surgery

## 2013-01-29 ENCOUNTER — Encounter (INDEPENDENT_AMBULATORY_CARE_PROVIDER_SITE_OTHER): Payer: Medicare Other

## 2013-01-29 ENCOUNTER — Other Ambulatory Visit (INDEPENDENT_AMBULATORY_CARE_PROVIDER_SITE_OTHER): Payer: Self-pay

## 2013-01-29 ENCOUNTER — Encounter: Payer: Self-pay | Admitting: Vascular Surgery

## 2013-01-29 ENCOUNTER — Ambulatory Visit (INDEPENDENT_AMBULATORY_CARE_PROVIDER_SITE_OTHER): Payer: Medicare Other | Admitting: Vascular Surgery

## 2013-01-29 ENCOUNTER — Telehealth (INDEPENDENT_AMBULATORY_CARE_PROVIDER_SITE_OTHER): Payer: Self-pay

## 2013-01-29 VITALS — BP 151/85 | HR 100 | Ht 71.0 in | Wt 222.2 lb

## 2013-01-29 DIAGNOSIS — T82598A Other mechanical complication of other cardiac and vascular devices and implants, initial encounter: Secondary | ICD-10-CM | POA: Diagnosis not present

## 2013-01-29 DIAGNOSIS — N186 End stage renal disease: Secondary | ICD-10-CM

## 2013-01-29 DIAGNOSIS — Z48812 Encounter for surgical aftercare following surgery on the circulatory system: Secondary | ICD-10-CM

## 2013-01-29 DIAGNOSIS — T82898A Other specified complication of vascular prosthetic devices, implants and grafts, initial encounter: Secondary | ICD-10-CM | POA: Diagnosis not present

## 2013-01-29 MED ORDER — ERYTHROMYCIN BASE 500 MG PO TABS: ORAL_TABLET | ORAL | Status: DC

## 2013-01-29 NOTE — Progress Notes (Signed)
[facility] [place]  Patient name: Bryan Gilmore. MRN: [id number] DOB: 1951/08/02 Sex: male  REASON FOR VISIT: Follow up of right radiocephalic AV fistula.  HPI: Bryan Gilmore. is a 62 y.o. male who had placement of a right radiocephalic AV fistula on 11/08/2012. The vein was noted to be 3.5 mm. The artery was noted to be 2.5 mm. This was slow to mature. Therefore he underwent a fistulogram on 01/06/2013. He had successful venoplasty of 3 stenoses within the right radiocephalic AV fistula.on that study it was noted that the upper arm cephalic vein was fairly small. The forearm cephalic vein essentially empties into the basilic system with the upper arm cephalic vein been a branch off of this. He continues to use his catheter for dialysis.  REVIEW OF SYSTEMS: Arly.Keller ] denotes positive finding; [  ] denotes negative finding  CARDIOVASCULAR:  [ ]  chest pain   [ ]  dyspnea on exertion    CONSTITUTIONAL:  [ ]  fever   [ ]  chills  PHYSICAL EXAM: Filed Vitals:   01/29/13 1513  BP: 151/85  Pulse: 100  Height: 5\' 11"  (1.803 m)  Weight: 222 lb 3.2 oz (100.789 kg)  SpO2: 100%   Body mass index is 31 kg/(m^2). GENERAL: The patient is a well-nourished male, in no acute distress. The vital signs are documented above. CARDIOVASCULAR: There is a regular rate and rhythm  PULMONARY: There is good air exchange bilaterally without wheezing or rales. The fistula has a reasonable thrill which is improved but the vein is still somewhat small.  Duplex scan shows there continues to be some areas of increased velocity likely related to some diffuse intimal hyperplasia of the vein. Diameters range from 0.23-0.55 cm.  MEDICAL ISSUES: I think that it would be reasonable to give the vein a few more weeks to mature in and try to cannulate it. If this is not successful then this should probably be converted to an upper arm brachiocephalic fistula or if by ultrasound as were 2 small a basilic vein  transposition. He knows to call if they have problems cannulating his fistula approximately 3 weeks.  Kynzi Levay S [facility] [place] [facility]: [phone]

## 2013-01-29 NOTE — Telephone Encounter (Signed)
Refill authorization faxed to [facility] @ [phone] for ErythromycinBS 500MG  tab, take two tablets at 3pm, 6pm and 10pm, #6 pre-op antibiotic

## 2013-02-05 ENCOUNTER — Ambulatory Visit (INDEPENDENT_AMBULATORY_CARE_PROVIDER_SITE_OTHER): Payer: Medicare Other | Admitting: Internal Medicine

## 2013-02-05 ENCOUNTER — Encounter: Payer: Self-pay | Admitting: Internal Medicine

## 2013-02-05 ENCOUNTER — Encounter: Payer: Self-pay | Admitting: *Deleted

## 2013-02-05 VITALS — BP 126/77 | HR 68 | Temp 98.1°F | Wt 221.0 lb

## 2013-02-05 DIAGNOSIS — B20 Human immunodeficiency virus [HIV] disease: Secondary | ICD-10-CM

## 2013-02-05 DIAGNOSIS — B1920 Unspecified viral hepatitis C without hepatic coma: Secondary | ICD-10-CM

## 2013-02-05 LAB — BASIC METABOLIC PANEL
CO2: 26 mEq/L (ref 19–32)
Calcium: 9.8 mg/dL (ref 8.4–10.5)
Chloride: 97 mEq/L (ref 96–112)
Creat: 6.46 mg/dL — ABNORMAL HIGH (ref 0.50–1.35)
Sodium: 136 mEq/L (ref 135–145)

## 2013-02-05 NOTE — Progress Notes (Signed)
Advised patient of the referral to the Hep C clinic Dr Drue Second wants him to have and he advised he does not want to go. Patient advised that he has to many doctors at this time and his dialysis center does regular blood draws to check his liver function and he feels comfortable with that. I advised him that the Hep clinic could do a better job at monitoring his liver and evaulating for possible damage as they are specialist and he still declined. He advised he will let us know when he is ready. Advised him not to wait to long as once damage is done it may be harder to help him. He advised he has bigger worries than that but he will think about it. Advised him will let the doctor know his decision and to call me if he changes his mind, gave him one of Dr Feliz Beam cards and put my name on the back.

## 2013-02-05 NOTE — Progress Notes (Signed)
[facility] CLIN[facility] NOTE  RFV: routine visit Subjective:    Patient ID: Bryan Mccaslin., male    DOB: 05/13/1951, 62 y.o.   MRN: [id number]  HPI  62yo Male with HIV-HCV, genotype 1b, as well ESRD, on HD tues-thru,CD 4 count of 450/VL 1950 ( in march 2014) , had delay in getting back on ART. He was restarted on his old regimen with raltegravir, lopinavir/rit, lamivudine, zidovudine for the past 6 wks consistently. The patient has been taking his meds regularly. Still urinates. Colostomy reversal scheduled for June 13th.. Vascular surgery states that his right forearm AVG is still healing. Still has cath-HON dialysis catheter in right upper chest, which will be removed once ready to use new graft. Overall doing well. Looking forward to his colostomy takedown. Current Outpatient Prescriptions on File Prior to Visit  Medication Sig Dispense Refill  . allopurinol (ZYLOPRIM) 300 MG tablet Take 300 mg by mouth daily.      Marland Kitchen b complex-vitamin c-folic acid (NEPHRO-VITE) 0.8 MG TABS Take 0.8 mg by mouth at bedtime.      . calcium acetate (PHOSLO) 667 MG capsule Take 667-1,334 mg by mouth 6 (six) times daily. Take 2 capsules by mouth 3 times daily with meals and 1 capsule by mouth 3 times daily with snacks.      . lamivudine (EPIVIR) 100 MG tablet Take 0.5 tablets (50 mg total) by mouth daily.  30 tablet  6  . lopinavir-ritonavir (KALETRA) 200-50 MG per tablet Take 2 tablets by mouth 2 (two) times daily.  120 tablet  11  . Ostomy Supplies (SKIN PREP WIPES) MISC Use as Directed  50 each  6  . Ostomy Supplies KIT Use as Directed  1 each  6  . raltegravir (ISENTRESS) 400 MG tablet Take 1 tablet (400 mg total) by mouth 2 (two) times daily.  60 tablet  11  . zidovudine (RETROVIR) 300 MG tablet Take 1 tablet (300 mg total) by mouth daily.  30 tablet  11  . erythromycin base (E-MYCIN) 500 MG tablet Take 500 mg by mouth 3 (three) times daily. Day before procedure.      . gabapentin (NEURONTIN) 100 MG capsule Take 100  mg by mouth 3 (three) times daily.       No current facility-administered medications on file prior to visit.   Active Ambulatory Problems    Diagnosis Date Noted  . HIV INFECTION 09/15/2010  . HEPATITIS C 10/07/2010  . GOUT, CHRONIC 10/07/2010  . 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 Normocytic anemia 02/17/2012  . ESRD (end stage renal disease) 02/29/2012  . Anemia of chronic renal failure 02/29/2012  . Hyperkalemia 03/29/2012  . Secondary hyperparathyroidism (of renal origin) 03/29/2012  . Fever 04/01/2012  . Delirium 04/01/2012  . Atrial fibrillation, permanent 04/04/2012  . Alcohol abuse   . Metabolic encephalopathy 04/06/2012  . Fournier's gangrene in male 06/05/2012  . End stage renal disease 10/30/2012  . Aftercare following surgery of the circulatory system, NEC 12/18/2012  . Colostomy status 12/26/2012  . Other complications due to renal dialysis device, implant, and graft 01/29/2013   Resolved Ambulatory Problems    Diagnosis Date Noted  . Decreased libido 10/07/2010  . Sepsis secondary to suspected Fornier's gangrene 02/17/2012  . ARF (acute renal failure) in the setting of stage III CKD 02/17/2012  . Metabolic acidosis 02/17/2012  . Hyperkalemia 02/17/2012  . Hyponatremia 02/17/2012  . Acute respiratory failure 02/17/2012   Past Medical History  Diagnosis Date  . HIV (  human immunodeficiency virus infection)   . Gout   . Hepatitis C   . Fournier's gangrene   . Anemia   . Severe protein-calorie malnutrition   . Dysrhythmia   . Hypertension   . Sebaceous cyst   . Cough   . Unspecified constipation   . Gout, unspecified   . GERD (gastroesophageal reflux disease)   . Peripheral vascular disease, unspecified   . Atrial fibrillation   . Ulcer of lower limb, unspecified   . Ulcer of lower limb, unspecified   . Other severe protein-calorie malnutrition   . Disorders of phosphorus metabolism   . Hypopotassemia   . Anemia in chronic kidney  disease(285.21)   . Anemia in chronic kidney disease(285.21)   . Anemia, unspecified   . Hepatitis, unspecified   . Altered mental status   . Hemoptysis, unspecified   . Gangrene    Social and family hx are unchanged   Review of Systems Review of Systems  Constitutional: Negative for fever, chills, diaphoresis, activity change, appetite change, fatigue and unexpected weight change.  HENT: Negative for congestion, sore throat, rhinorrhea, sneezing, trouble swallowing and sinus pressure.  Eyes: Negative for photophobia and visual disturbance.  Respiratory: Negative for cough, chest tightness, shortness of breath, wheezing and stridor.  Cardiovascular: Negative for chest pain, palpitations and leg swelling.  Gastrointestinal: Negative for nausea, vomiting, abdominal pain, diarrhea, constipation, blood in stool, abdominal distention and anal bleeding.  Genitourinary: Negative for dysuria, hematuria, flank pain and difficulty urinating.  Musculoskeletal: Negative for myalgias, back pain, joint swelling, arthralgias and gait problem.  Skin: Negative for color change, pallor, rash and wound.  Neurological: Negative for dizziness, tremors, weakness and light-headedness.  Hematological: Negative for adenopathy. Does not bruise/bleed easily.  Psychiatric/Behavioral: Negative for behavioral problems, confusion, sleep disturbance, dysphoric mood, decreased concentration and agitation.       Objective:   Physical Exam BP 126/77  Pulse 68  Temp(Src) 98.1 F (36.7 C) (Oral)  Wt 221 lb (100.245 kg)  BMI 30.84 kg/m2  Right eye me dial pterygium  Physical Exam  Constitutional: He is oriented to person, place, and time. He appears well-developed and well-nourished. No distress.  HENT:  Mouth/Throat: Oropharynx is clear and moist. No oropharyngeal exudate.  Cardiovascular: Normal rate, regular rhythm and normal heart sounds. Exam reveals no gallop and no friction rub.  No murmur heard.   Pulmonary/Chest: Effort normal and breath sounds normal. No respiratory distress. He has no wheezes.  Abdominal: Soft. Bowel sounds are normal. He exhibits no distension. There is no tenderness. Ostomy on RLQ Lymphadenopathy:  no cervical adenopathy.  Neurological: He is alert and oriented to person, place, and time.  Ext: left forearm AV graft is prominent. Thrill notable on left forearm Skin: Skin is warm and dry. No rash noted. No erythema.  Psychiatric: He has a normal mood and affect. His behavior is normal.         Assessment & Plan:  hiv = will check cd 4 count and viral load to see that he has appropriate viral suppression after having a brief break off of meds. Plan to switch from [place] to boosted darunavir  hcv = will check hcv viral load  Colostomy = patient getting reversal in mid June which he is looking forward to doing.  rtc 4-6 wk

## 2013-02-06 LAB — HEPATITIS C RNA QUANTITATIVE
HCV Quantitative Log: 6.87 {Log} — ABNORMAL HIGH (ref <1.18)
HCV Quantitative: 7491727 IU/mL — ABNORMAL HIGH (ref <15)

## 2013-02-06 LAB — HIV-1 RNA QUANT-NO REFLEX-BLD: HIV-1 RNA Quant, Log: <1.3 {Log} (ref <1.30)

## 2013-02-07 ENCOUNTER — Encounter (HOSPITAL_COMMUNITY): Payer: Self-pay | Admitting: Pharmacy Technician

## 2013-02-12 ENCOUNTER — Encounter (HOSPITAL_COMMUNITY)
Admission: RE | Admit: 2013-02-12 | Discharge: 2013-02-12 | Disposition: A | Discharge status: Home / Self Care | Payer: BC Managed Care – PPO | Source: Ambulatory Visit | Attending: General Surgery | Admitting: General Surgery

## 2013-02-12 NOTE — Pre-Procedure Instructions (Signed)
Bryan Gilmore.  02/12/2013   Your procedure is scheduled on:  02/21/13  Report to [name] at 530 AM.  Call this number if you have problems the morning of surgery: [phone]   Remember:   Do not eat food or drink liquids after midnight.   Take these medicines the morning of surgery with A SIP OF WATER: all hiv meds taken in am   Do not wear jewelry, make-up or nail polish.  Do not wear lotions, powders, or perfumes. You may wear deodorant.  Do not shave 48 hours prior to surgery. Men may shave face and neck.  Do not bring valuables to the hospital.  [facility] is not responsible                   for any belongings or valuables.  Contacts, dentures or bridgework may not be worn into surgery.  Leave suitcase in the car. After surgery it may be brought to your room.  For patients admitted to the hospital, checkout time is 11:00 AM the day of  discharge.   Patients discharged the day of surgery will not be allowed to drive  home.  Name and phone number of your driver:  Special Instructions: Shower using CHG 2 nights before surgery and the night before surgery.  If you shower the day of surgery use CHG.  Use special wash - you have one bottle of CHG for all showers.  You should use approximately 1/3 of the bottle for each shower.   Please read over the following fact sheets that you were given: Pain Booklet, Coughing and Deep Breathing, MRSA Information and Surgical Site Infection Prevention

## 2013-02-13 NOTE — Progress Notes (Addendum)
Anesthesia Chart Review: Patient is a 62 year old male scheduled for colostomy takedown on 02/21/2013 by Dr. Johna Sheriff. He missed his PAT appointment on 02/12/13 and it has been rescheduled for 02/19/13.  History includes ESRD on HD TTS [facility] [place]) with history of RUE AVF (but currently getting HD via right chest wall Diatek), HIV, Hepatitis C, ETOH abuse sober for the past year, afib, anemia, PVD, non-smoker. HTN, GERD, gout, extensive surgery for Fournier's gangrene in 02/2012 requiring I&D of peroneal and perirectal abscess and later a diverting colostomy.  He was last evaluated by Dr. Judyann Munson on 01/15/13 in the HIV clinic and she is aware of planned surgery.  PCP is Candelaria Celeste, NP with [facility].   He was evaluated by cardiologist Dr. [place] 04/04/2012 for permanent afib/flutter, rate controlled on b-blocker therapy. (He was previously noted to be in afib during his June 2013 hospitalization but cardiology was not consulted at that time.) Echo was unremarkable and he was not felt to be a candidate for anticoagulation due to his multiple co-morbidities.  Cardiology had nothing further to add at that point and signed off.  He is not followed by cardiology now.   His last EKG on 08/04/12 showed afib @ 80 bpm, non-specific ST/T abnormality.  2V CXR on 08/04/12 showed cardiomegaly without congestive failure. 1V that same day showed satisfactory position of dialysis catheter without evidence of complications.  Labs are pending his PAT visit.  Velna Ochs [facility]/Anesthesiology Phone [phone] 02/13/2013 5:45 PM  Addendum: 02/19/13 1120 I had previously reviewed his chart with anesthesiologist Dr. Gypsy Balsam who agreed that if there was no new changes in his CV status/symptomology that patient could likely proceed as planned if labs were acceptable.  I evaluated Mr. Leach after his PAT visit today.  He feels well and is tolerating HD.  He denies chest pain, SOB,  significant DOE, and LE edema.  He does not do regular aerobic exercise, but can climb stairs, do his shopping, and routine daily activities without difficulty.  Exam show a pleasant, black male in NAD.  Heart is irregular, rate 60-70 bpm.  Lungs clear.  No carotid bruits or LE edema noted.  + thrill in RUE AVF.  Today's labs appear acceptable for OR.  He has known ESRD and will need an ISTAT on the day of surgery.

## 2013-02-19 ENCOUNTER — Encounter (HOSPITAL_COMMUNITY)
Admission: RE | Admit: 2013-02-19 | Discharge: 2013-02-19 | Disposition: A | Discharge status: Home / Self Care | Payer: BC Managed Care – PPO | Source: Ambulatory Visit | Attending: General Surgery | Admitting: General Surgery

## 2013-02-19 ENCOUNTER — Encounter (HOSPITAL_COMMUNITY): Payer: Self-pay

## 2013-02-19 HISTORY — DX: Unspecified osteoarthritis, unspecified site: M19.90

## 2013-02-19 LAB — COMPREHENSIVE METABOLIC PANEL
Albumin: 3.5 g/dL (ref 3.5–5.2)
Alkaline Phosphatase: 77 U/L (ref 39–117)
BUN: 38 mg/dL — ABNORMAL HIGH (ref 6–23)
CO2: 27 mEq/L (ref 19–32)
Calcium: 9.6 mg/dL (ref 8.4–10.5)
Chloride: 96 mEq/L (ref 96–112)
GFR calc Af Amer: 9 mL/min — ABNORMAL LOW (ref >90)
Sodium: 138 mEq/L (ref 135–145)
Total Protein: 8.7 g/dL — ABNORMAL HIGH (ref 6.0–8.3)

## 2013-02-19 LAB — CBC
HCT: 36.9 % — ABNORMAL LOW (ref 39.0–52.0)
Hemoglobin: 12.8 g/dL — ABNORMAL LOW (ref 13.0–17.0)
MCV: 99.5 fL (ref 78.0–100.0)
RBC: 3.71 MIL/uL — ABNORMAL LOW (ref 4.22–5.81)
RDW: 16.3 % — ABNORMAL HIGH (ref 11.5–15.5)
WBC: 6 10*3/uL (ref 4.0–10.5)

## 2013-02-19 LAB — PROTIME-INR
INR: 1 (ref 0.00–1.49)
Prothrombin Time: 13.1 seconds (ref 11.6–15.2)

## 2013-02-19 LAB — SURGICAL PCR SCREEN: Staphylococcus aureus: NEGATIVE

## 2013-02-19 NOTE — Pre-Procedure Instructions (Signed)
Bryan Gilmore.  02/19/2013   Your procedure is scheduled on:  02/21/13  Report to [name] at 530 AM.  Call this number if you have problems the morning of surgery: [phone]   Remember:   Do not eat food or drink liquids after midnight.   Take these medicines the morning of surgery with A SIP OF WATER: all hiv meds taken in am   Do not wear jewelry, make-up or nail polish.  Do not wear lotions, powders, or perfumes. You may wear deodorant.  Do not shave 48 hours prior to surgery. Men may shave face and neck.  Do not bring valuables to the hospital.  [facility] is not responsible                   for any belongings or valuables.  Contacts, dentures or bridgework may not be worn into surgery.  Leave suitcase in the car. After surgery it may be brought to your room.  For patients admitted to the hospital, checkout time is 11:00 AM the day of  discharge.   Patients discharged the day of surgery will not be allowed to drive  home.  Name and phone number of your driver:  Special Instructions: Shower using CHG 2 nights before surgery and the night before surgery.  If you shower the day of surgery use CHG.  Use special wash - you have one bottle of CHG for all showers.  You should use approximately 1/3 of the bottle for each shower.   Please read over the following fact sheets that you were given: Pain Booklet, Coughing and Deep Breathing, MRSA Information and Surgical Site Infection Prevention

## 2013-02-20 MED ORDER — DEXTROSE 5 % IV SOLN
2.0000 g | INTRAVENOUS | Status: AC
  Administered 2013-02-21: 2 g via INTRAVENOUS
  Filled 2013-02-20: qty 2

## 2013-02-20 MED ORDER — CHLORHEXIDINE GLUCONATE 4 % EX LIQD: 1.0000 "application " | Freq: Once | CUTANEOUS | Status: DC

## 2013-02-21 ENCOUNTER — Encounter (HOSPITAL_COMMUNITY): Payer: Self-pay | Admitting: *Deleted

## 2013-02-21 ENCOUNTER — Inpatient Hospital Stay (HOSPITAL_COMMUNITY)
Admission: RE | Admit: 2013-02-21 | Discharge: 2013-02-28 | DRG: 704 | Disposition: A | Discharge status: Home / Self Care | Payer: BC Managed Care – PPO | Source: Ambulatory Visit | Attending: General Surgery | Admitting: General Surgery

## 2013-02-21 ENCOUNTER — Encounter (HOSPITAL_COMMUNITY)
Admission: RE | Disposition: A | Discharge status: Home / Self Care | Payer: Self-pay | Source: Ambulatory Visit | Attending: General Surgery

## 2013-02-21 ENCOUNTER — Encounter (HOSPITAL_COMMUNITY): Payer: Self-pay | Admitting: Vascular Surgery

## 2013-02-21 ENCOUNTER — Inpatient Hospital Stay (HOSPITAL_COMMUNITY): Payer: BC Managed Care – PPO | Admitting: Anesthesiology

## 2013-02-21 DIAGNOSIS — I4891 Unspecified atrial fibrillation: Secondary | ICD-10-CM | POA: Diagnosis present

## 2013-02-21 DIAGNOSIS — Q453 Other congenital malformations of pancreas and pancreatic duct: Secondary | ICD-10-CM

## 2013-02-21 DIAGNOSIS — M899 Disorder of bone, unspecified: Secondary | ICD-10-CM | POA: Diagnosis present

## 2013-02-21 DIAGNOSIS — K432 Incisional hernia without obstruction or gangrene: Secondary | ICD-10-CM | POA: Diagnosis present

## 2013-02-21 DIAGNOSIS — D214 Benign neoplasm of connective and other soft tissue of abdomen: Secondary | ICD-10-CM

## 2013-02-21 DIAGNOSIS — I739 Peripheral vascular disease, unspecified: Secondary | ICD-10-CM | POA: Diagnosis present

## 2013-02-21 DIAGNOSIS — K219 Gastro-esophageal reflux disease without esophagitis: Secondary | ICD-10-CM | POA: Diagnosis present

## 2013-02-21 DIAGNOSIS — I120 Hypertensive chronic kidney disease with stage 5 chronic kidney disease or end stage renal disease: Secondary | ICD-10-CM | POA: Diagnosis present

## 2013-02-21 DIAGNOSIS — Y9210 Unspecified residential institution as the place of occurrence of the external cause: Secondary | ICD-10-CM | POA: Diagnosis present

## 2013-02-21 DIAGNOSIS — M949 Disorder of cartilage, unspecified: Secondary | ICD-10-CM | POA: Diagnosis present

## 2013-02-21 DIAGNOSIS — Y833 Surgical operation with formation of external stoma as the cause of abnormal reaction of the patient, or of later complication, without mention of misadventure at the time of the procedure: Secondary | ICD-10-CM | POA: Diagnosis present

## 2013-02-21 DIAGNOSIS — N186 End stage renal disease: Secondary | ICD-10-CM | POA: Diagnosis present

## 2013-02-21 DIAGNOSIS — E43 Unspecified severe protein-calorie malnutrition: Secondary | ICD-10-CM | POA: Diagnosis present

## 2013-02-21 DIAGNOSIS — D631 Anemia in chronic kidney disease: Secondary | ICD-10-CM | POA: Diagnosis present

## 2013-02-21 DIAGNOSIS — Z933 Colostomy status: Secondary | ICD-10-CM

## 2013-02-21 DIAGNOSIS — M109 Gout, unspecified: Secondary | ICD-10-CM | POA: Diagnosis present

## 2013-02-21 DIAGNOSIS — Z992 Dependence on renal dialysis: Secondary | ICD-10-CM

## 2013-02-21 DIAGNOSIS — B1920 Unspecified viral hepatitis C without hepatic coma: Secondary | ICD-10-CM | POA: Diagnosis present

## 2013-02-21 DIAGNOSIS — Z433 Encounter for attention to colostomy: Secondary | ICD-10-CM

## 2013-02-21 DIAGNOSIS — N501 Vascular disorders of male genital organs: Secondary | ICD-10-CM | POA: Diagnosis present

## 2013-02-21 DIAGNOSIS — B20 Human immunodeficiency virus [HIV] disease: Principal | ICD-10-CM | POA: Diagnosis present

## 2013-02-21 DIAGNOSIS — N039 Chronic nephritic syndrome with unspecified morphologic changes: Secondary | ICD-10-CM | POA: Diagnosis present

## 2013-02-21 DIAGNOSIS — F1010 Alcohol abuse, uncomplicated: Secondary | ICD-10-CM | POA: Diagnosis present

## 2013-02-21 HISTORY — PX: BOWEL RESECTION: SHX1257

## 2013-02-21 HISTORY — PX: COLOSTOMY CLOSURE: SHX1381

## 2013-02-21 LAB — BASIC METABOLIC PANEL
BUN: 29 mg/dL — ABNORMAL HIGH (ref 6–23)
CO2: 25 mEq/L (ref 19–32)
Chloride: 97 mEq/L (ref 96–112)
Creatinine, Ser: 5.66 mg/dL — ABNORMAL HIGH (ref 0.50–1.35)

## 2013-02-21 LAB — POCT I-STAT 4, (NA,K, GLUC, HGB,HCT)
Glucose, Bld: 104 mg/dL — ABNORMAL HIGH (ref 70–99)
HCT: 39 % (ref 39.0–52.0)
Potassium: 4 mEq/L (ref 3.5–5.1)

## 2013-02-21 LAB — CBC
HCT: 35.2 % — ABNORMAL LOW (ref 39.0–52.0)
MCV: 99.7 fL (ref 78.0–100.0)
RBC: 3.53 MIL/uL — ABNORMAL LOW (ref 4.22–5.81)
RDW: 16.4 % — ABNORMAL HIGH (ref 11.5–15.5)
WBC: 8.2 10*3/uL (ref 4.0–10.5)

## 2013-02-21 SURGERY — COLOSTOMY CLOSURE
Anesthesia: General | Wound class: Contaminated

## 2013-02-21 MED ORDER — ALTEPLASE 2 MG IJ SOLR
2.0000 mg | Freq: Once | INTRAMUSCULAR | Status: AC | PRN
  Filled 2013-02-21: qty 2

## 2013-02-21 MED ORDER — PENTAFLUOROPROP-TETRAFLUOROETH EX AERO: 1.0000 "application " | INHALATION_SPRAY | CUTANEOUS | Status: DC | PRN

## 2013-02-21 MED ORDER — SODIUM CHLORIDE 0.9 % IV SOLN: 100.0000 mL | INTRAVENOUS | Status: DC | PRN

## 2013-02-21 MED ORDER — ROCURONIUM BROMIDE 100 MG/10ML IV SOLN
INTRAVENOUS | Status: DC | PRN
  Administered 2013-02-21 (×2): 10 mg via INTRAVENOUS
  Administered 2013-02-21: 50 mg via INTRAVENOUS
  Administered 2013-02-21: 10 mg via INTRAVENOUS

## 2013-02-21 MED ORDER — HEPARIN SODIUM (PORCINE) 1000 UNIT/ML DIALYSIS
1000.0000 [IU] | INTRAMUSCULAR | Status: DC | PRN
  Filled 2013-02-21: qty 1

## 2013-02-21 MED ORDER — NEPRO/CARBSTEADY PO LIQD
237.0000 mL | ORAL | Status: DC | PRN
  Filled 2013-02-21: qty 237

## 2013-02-21 MED ORDER — MORPHINE SULFATE 4 MG/ML IJ SOLN
INTRAMUSCULAR | Status: AC
  Filled 2013-02-21: qty 1

## 2013-02-21 MED ORDER — LIDOCAINE-PRILOCAINE 2.5-2.5 % EX CREA
1.0000 "application " | TOPICAL_CREAM | CUTANEOUS | Status: DC | PRN
  Filled 2013-02-21: qty 5

## 2013-02-21 MED ORDER — MORPHINE SULFATE 2 MG/ML IJ SOLN
2.0000 mg | INTRAMUSCULAR | Status: DC | PRN
  Administered 2013-02-21: 4 mg via INTRAVENOUS
  Administered 2013-02-21 – 2013-02-22 (×8): 2 mg via INTRAVENOUS
  Administered 2013-02-22: 4 mg via INTRAVENOUS
  Administered 2013-02-23 (×3): 2 mg via INTRAVENOUS
  Administered 2013-02-23 – 2013-02-24 (×2): 4 mg via INTRAVENOUS
  Administered 2013-02-25 – 2013-02-27 (×4): 2 mg via INTRAVENOUS
  Administered 2013-02-27: 4 mg via INTRAVENOUS
  Filled 2013-02-21: qty 1
  Filled 2013-02-21 (×2): qty 2
  Filled 2013-02-21: qty 1
  Filled 2013-02-21 (×2): qty 2
  Filled 2013-02-21 (×3): qty 1
  Filled 2013-02-21 (×2): qty 2
  Filled 2013-02-21 (×4): qty 1

## 2013-02-21 MED ORDER — SODIUM CHLORIDE 0.9 % IV SOLN
INTRAVENOUS | Status: DC | PRN
  Administered 2013-02-21 (×2): via INTRAVENOUS

## 2013-02-21 MED ORDER — LIDOCAINE HCL (CARDIAC) 20 MG/ML IV SOLN
INTRAVENOUS | Status: DC | PRN
  Administered 2013-02-21: 100 mg via INTRAVENOUS

## 2013-02-21 MED ORDER — ONDANSETRON HCL 4 MG/2ML IJ SOLN
INTRAMUSCULAR | Status: DC | PRN
  Administered 2013-02-21: 4 mg via INTRAVENOUS

## 2013-02-21 MED ORDER — RENA-VITE PO TABS
1.0000 | ORAL_TABLET | Freq: Every day | ORAL | Status: DC
  Administered 2013-02-26 – 2013-02-28 (×3): 1 via ORAL
  Filled 2013-02-21 (×8): qty 1

## 2013-02-21 MED ORDER — FENTANYL CITRATE 0.05 MG/ML IJ SOLN
INTRAMUSCULAR | Status: DC | PRN
  Administered 2013-02-21 (×3): 50 ug via INTRAVENOUS
  Administered 2013-02-21: 100 ug via INTRAVENOUS
  Administered 2013-02-21 (×5): 50 ug via INTRAVENOUS

## 2013-02-21 MED ORDER — CHLORHEXIDINE GLUCONATE 0.12 % MT SOLN
15.0000 mL | Freq: Two times a day (BID) | OROMUCOSAL | Status: DC
  Administered 2013-02-21 – 2013-02-28 (×13): 15 mL via OROMUCOSAL
  Filled 2013-02-21 (×9): qty 15

## 2013-02-21 MED ORDER — MIDAZOLAM HCL 5 MG/5ML IJ SOLN
INTRAMUSCULAR | Status: DC | PRN
  Administered 2013-02-21: 2 mg via INTRAVENOUS

## 2013-02-21 MED ORDER — HEPARIN SODIUM (PORCINE) 5000 UNIT/ML IJ SOLN
5000.0000 [IU] | Freq: Three times a day (TID) | INTRAMUSCULAR | Status: DC
  Administered 2013-02-22 – 2013-02-28 (×18): 5000 [IU] via SUBCUTANEOUS
  Filled 2013-02-21 (×27): qty 1

## 2013-02-21 MED ORDER — 0.9 % SODIUM CHLORIDE (POUR BTL) OPTIME
TOPICAL | Status: DC | PRN
  Administered 2013-02-21: 1000 mL

## 2013-02-21 MED ORDER — NEOSTIGMINE METHYLSULFATE 1 MG/ML IJ SOLN
INTRAMUSCULAR | Status: DC | PRN
  Administered 2013-02-21: 5 mg via INTRAVENOUS

## 2013-02-21 MED ORDER — CALCIUM ACETATE 667 MG PO CAPS
1334.0000 mg | ORAL_CAPSULE | Freq: Three times a day (TID) | ORAL | Status: DC
  Administered 2013-02-26 – 2013-02-28 (×6): 1334 mg via ORAL
  Filled 2013-02-21 (×22): qty 2

## 2013-02-21 MED ORDER — ONDANSETRON HCL 4 MG/2ML IJ SOLN: 4.0000 mg | Freq: Four times a day (QID) | INTRAMUSCULAR | Status: DC | PRN

## 2013-02-21 MED ORDER — ONDANSETRON HCL 4 MG PO TABS: 4.0000 mg | ORAL_TABLET | Freq: Four times a day (QID) | ORAL | Status: DC | PRN

## 2013-02-21 MED ORDER — PROPOFOL 10 MG/ML IV BOLUS
INTRAVENOUS | Status: DC | PRN
  Administered 2013-02-21: 200 mg via INTRAVENOUS

## 2013-02-21 MED ORDER — SODIUM CHLORIDE 0.9 % IV SOLN
INTRAVENOUS | Status: DC
  Administered 2013-02-21 – 2013-02-23 (×3): via INTRAVENOUS

## 2013-02-21 MED ORDER — BIOTENE DRY MOUTH MT LIQD
15.0000 mL | Freq: Two times a day (BID) | OROMUCOSAL | Status: DC
  Administered 2013-02-22 – 2013-02-27 (×9): 15 mL via OROMUCOSAL

## 2013-02-21 MED ORDER — GLYCOPYRROLATE 0.2 MG/ML IJ SOLN
INTRAMUSCULAR | Status: DC | PRN
  Administered 2013-02-21: .8 mg via INTRAVENOUS

## 2013-02-21 MED ORDER — LIDOCAINE HCL (PF) 1 % IJ SOLN
5.0000 mL | INTRAMUSCULAR | Status: DC | PRN
  Filled 2013-02-21: qty 5

## 2013-02-21 MED ORDER — HYDROMORPHONE HCL PF 1 MG/ML IJ SOLN
INTRAMUSCULAR | Status: AC
  Filled 2013-02-21: qty 2

## 2013-02-21 MED ORDER — HYDROMORPHONE HCL PF 1 MG/ML IJ SOLN
0.2500 mg | INTRAMUSCULAR | Status: DC | PRN
  Administered 2013-02-21 (×4): 0.5 mg via INTRAVENOUS

## 2013-02-21 MED ORDER — PHENYLEPHRINE HCL 10 MG/ML IJ SOLN
INTRAMUSCULAR | Status: DC | PRN
  Administered 2013-02-21 (×2): 80 ug via INTRAVENOUS

## 2013-02-21 SURGICAL SUPPLY — 70 items
BLADE SURG ROTATE 9660 (MISCELLANEOUS) IMPLANT
CANISTER SUCTION 2500CC (MISCELLANEOUS) ×2 IMPLANT
CLIP TI LARGE 6 (CLIP) IMPLANT
CLIP TI MEDIUM 6 (CLIP) IMPLANT
CLOTH BEACON ORANGE TIMEOUT ST (SAFETY) ×2 IMPLANT
COVER MAYO STAND STRL (DRAPES) ×2 IMPLANT
COVER SURGICAL LIGHT HANDLE (MISCELLANEOUS) ×2 IMPLANT
DRAPE LAPAROSCOPIC ABDOMINAL (DRAPES) ×2 IMPLANT
DRAPE PROXIMA HALF (DRAPES) ×1 IMPLANT
DRAPE UTILITY 15X26 W/TAPE STR (DRAPE) ×6 IMPLANT
DRAPE WARM FLUID 44X44 (DRAPE) ×2 IMPLANT
DRSG OPSITE POSTOP 4X10 (GAUZE/BANDAGES/DRESSINGS) ×1 IMPLANT
DRSG OPSITE POSTOP 4X6 (GAUZE/BANDAGES/DRESSINGS) ×1 IMPLANT
ELECT BLADE 6.5 EXT (BLADE) ×1 IMPLANT
ELECT REM PT RETURN 9FT ADLT (ELECTROSURGICAL) ×2
ELECTRODE REM PT RTRN 9FT ADLT (ELECTROSURGICAL) ×1 IMPLANT
GAUZE SPONGE 4X4 16PLY XRAY LF (GAUZE/BANDAGES/DRESSINGS) IMPLANT
GLOVE BIO SURGEON STRL SZ7.5 (GLOVE) ×3 IMPLANT
GLOVE BIOGEL M 7.0 STRL (GLOVE) ×1 IMPLANT
GLOVE BIOGEL PI IND STRL 7.0 (GLOVE) IMPLANT
GLOVE BIOGEL PI IND STRL 7.5 (GLOVE) IMPLANT
GLOVE BIOGEL PI IND STRL 8 (GLOVE) ×2 IMPLANT
GLOVE BIOGEL PI INDICATOR 7.0 (GLOVE) ×4
GLOVE BIOGEL PI INDICATOR 7.5 (GLOVE) ×3
GLOVE BIOGEL PI INDICATOR 8 (GLOVE) ×3
GLOVE BIOGEL PI ORTHO PRO SZ8 (GLOVE)
GLOVE OPTIFIT SS 8.0 STRL (GLOVE) ×2 IMPLANT
GLOVE PI ORTHO PRO STRL SZ8 (GLOVE) IMPLANT
GLOVE SS BIOGEL STRL SZ 7.5 (GLOVE) ×2 IMPLANT
GLOVE SUPERSENSE BIOGEL SZ 7.5 (GLOVE) ×4
GLOVE SURG SS PI 7.0 STRL IVOR (GLOVE) ×1 IMPLANT
GOWN PREVENTION PLUS XLARGE (GOWN DISPOSABLE) ×6 IMPLANT
GOWN STRL NON-REIN LRG LVL3 (GOWN DISPOSABLE) ×11 IMPLANT
KIT BASIN OR (CUSTOM PROCEDURE TRAY) ×2 IMPLANT
KIT ROOM TURNOVER OR (KITS) ×2 IMPLANT
LEGGING LITHOTOMY PAIR STRL (DRAPES) IMPLANT
NS IRRIG 1000ML POUR BTL (IV SOLUTION) ×2 IMPLANT
PACK GENERAL/GYN (CUSTOM PROCEDURE TRAY) ×2 IMPLANT
PAD ARMBOARD 7.5X6 YLW CONV (MISCELLANEOUS) ×4 IMPLANT
PAD SHARPS MAGNETIC DISPOSAL (MISCELLANEOUS) ×2 IMPLANT
RELOAD PROXIMATE 75MM BLUE (ENDOMECHANICALS) ×8 IMPLANT
RELOAD PROXIMATE TA60MM BLUE (ENDOMECHANICALS) ×2 IMPLANT
RELOAD STAPLE 60 BLU REG PROX (ENDOMECHANICALS) IMPLANT
RELOAD STAPLE 75 3.8 BLU REG (ENDOMECHANICALS) IMPLANT
SPONGE LAP 18X18 X RAY DECT (DISPOSABLE) ×5 IMPLANT
SPONGE LAP 4X18 X RAY DECT (DISPOSABLE) IMPLANT
STAPLER GUN LINEAR PROX 60 (STAPLE) ×1 IMPLANT
STAPLER PROXIMATE 75MM BLUE (STAPLE) ×1 IMPLANT
STAPLER VISISTAT 35W (STAPLE) ×2 IMPLANT
SUCTION POOLE TIP (SUCTIONS) ×2 IMPLANT
SUT CHROMIC 0 CT 802H (SUTURE) ×2 IMPLANT
SUT CHROMIC 2 0 T10/V 26 (SUTURE) IMPLANT
SUT CHROMIC 3 0 SH 27 (SUTURE) IMPLANT
SUT NOV 1 T60/GS (SUTURE) ×4 IMPLANT
SUT NOVA 1 T20/GS 25DT (SUTURE) ×3 IMPLANT
SUT SILK 0 TIES 10X30 (SUTURE) ×2 IMPLANT
SUT SILK 2 0 FS (SUTURE) ×2 IMPLANT
SUT SILK 2 0 SH CR/8 (SUTURE) ×5 IMPLANT
SUT SILK 2 0 TIES 10X30 (SUTURE) ×2 IMPLANT
SUT SILK 2 0SH CR/8 30 (SUTURE) ×1 IMPLANT
SUT SILK 3 0 SH CR/8 (SUTURE) ×1 IMPLANT
SUT SILK 3 0 TIES 10X30 (SUTURE) ×2 IMPLANT
SYR BULB IRRIGATION 50ML (SYRINGE) ×2 IMPLANT
TOWEL OR 17X24 6PK STRL BLUE (TOWEL DISPOSABLE) ×1 IMPLANT
TOWEL OR 17X26 10 PK STRL BLUE (TOWEL DISPOSABLE) ×4 IMPLANT
TRAY FOLEY CATH 14FRSI W/METER (CATHETERS) ×1 IMPLANT
TRAY PROCTOSCOPIC FIBER OPTIC (SET/KITS/TRAYS/PACK) IMPLANT
UNDERPAD 30X30 INCONTINENT (UNDERPADS AND DIAPERS) ×2 IMPLANT
WATER STERILE IRR 1000ML POUR (IV SOLUTION) ×2 IMPLANT
YANKAUER SUCT BULB TIP NO VENT (SUCTIONS) ×2 IMPLANT

## 2013-02-21 NOTE — Consult Note (Signed)
[facility] Renal Consultation Note  Indication for Consultation:  Management of ESRD/hemodialysis; anemia, hypertension/volume and secondary hyperparathyroidism  HPI: Bryan Gilmore. is a 62 y.o. male with ESRD on dialysis on TTS at the [facility] who last June had treatment of sepsis secondary to severe Fournier's gangrene and, while a patient at [facility] (6/21 - 03/26/12) for continued care, required [place] procedure with diverting sigmoid colostomy due to soilage of wound with incontinence and diarrhea.  He has fully recovered and today presented for scheduled elective reversal of his colostomy by Dr. Glenna Fellows.  He is currently post-surgery and complains only of local pain.   Dialysis Orders: Center: [place] on TTS. EDW 98.5 kg   HD Bath 2K/2Ca  Time 4 hrs  Heparin 8600 U.  Access Right IJ catheter, AVF @ RFA (to be cannulated post-hospitalization)   BFR 450 DFR A1.5   Hectorol 0 mcg IV/HD Epogen 0 Units IV/HD  Venofer 0   Past Medical History  Diagnosis Date  . HIV (human immunodeficiency virus infection)   . Sepsis(995.91)   . Gout   . Hepatitis C   . Fournier's gangrene   . Anemia   . Severe protein-calorie malnutrition   . Alcohol abuse   . Dysrhythmia     AFib, not a candidate for coumadin , dr Swaziland signed off on pt  . Hypertension   . Colostomy status   . Sebaceous cyst   . Cough   . Unspecified constipation   . Gout, unspecified   . GERD (gastroesophageal reflux disease)   . Atrial fibrillation   . Ulcer of lower limb, unspecified   . Ulcer of lower limb, unspecified   . Human immunodeficiency virus (HIV) disease   . Other severe protein-calorie malnutrition   . Disorders of phosphorus metabolism   . Hypopotassemia   . Anemia in chronic kidney disease(285.21)   . Anemia in chronic kidney disease(285.21)   . Anemia, unspecified   . Hepatitis, unspecified   . Altered mental status   . Hemoptysis, unspecified   .  Acute respiratory failure   . Gangrene   . Sepsis(995.91)   . Peripheral vascular disease, unspecified     denies  . End stage renal disease     hemodialysis tues, thurs, sat-east fornesia  . End stage renal disease   . Arthritis     gout   Past Surgical History  Procedure Laterality Date  . Cystoscopy with urethral dilatation  02/17/2012    Procedure: CYSTOSCOPY WITH URETHRAL DILATATION;  Surgeon: Martina Sinner, MD;  Location: [facility] ORS;  Service: Urology;  Laterality: N/A;  retrograde urethragram, placement of foley catheter exam under anesthesia   . Insertion of dialysis catheter  02/20/2012    Procedure: INSERTION OF DIALYSIS CATHETER;  Surgeon: Chuck Hint, MD;  Location[facility] OR;  Service: Vascular;  Laterality: N/A;  . Irrigation and debridement abscess  02/29/2012    Procedure: IRRIGATION AND DEBRIDEMENT ABSCESS;  Surgeon: Martina Sinner, MD;  Location: [facility] OR;  Service: Urology;  Laterality: N/A;  I&D of scrotum abcess  . Insertion of dialysis catheter  03/29/2012    Procedure: INSERTION OF DIALYSIS CATHETER;  Surgeon: Larina Earthly, MD;  Location: [facility] OR;  Service: Vascular;  Laterality: N/A;  Insertion tunneled dialysis catheter right IJ  . Av fistula placement  04/12/2012    Procedure: INSERTION OF ARTERIOVENOUS (AV) GORE-TEX GRAFT ARM;  Surgeon: Larina Earthly, MD;  Location: [facility] OR;  Service: Vascular;  Laterality: Left;  insertion of 6x50 stretch goretex graft   . Thrombectomy and revision of arterioventous (av) goretex  graft  07/15/2012    Procedure: THROMBECTOMY AND REVISION OF ARTERIOVENTOUS (AV) GORETEX  GRAFT;  Surgeon: Sherren Kerns, MD;  Location: [facility] OR;  Service: Vascular;  Laterality: Left;  . Urethrogram  07/30/2012    Procedure: URETHROGRAM;  Surgeon: Martina Sinner, MD;  Location: [facility]S;  Service: Urology;  Laterality: N/A;  cystogram  . Cystoscopy  07/30/2012    Procedure: CYSTOSCOPY;  Surgeon: Martina Sinner, MD;  Location: [facility]S;  Service:  Urology;  Laterality: Bilateral;  retrograde  . Insertion of dialysis catheter  08/04/2012    Procedure: INSERTION OF DIALYSIS CATHETER;  Surgeon: Chuck Hint, MD;  Location: [facility] OR;  Service: Vascular;  Laterality: Right;  right intrajugular hemodialysis catheter insertion  . Av fistula placement  08/14/2012    Procedure: INSERTION OF ARTERIOVENOUS (AV) GORE-TEX GRAFT ARM;  Surgeon: Pryor Ochoa, MD;  Location: [facility] OR;  Service: Vascular;  Laterality: Left;  . Colostomy  02/2012    d/t an infection  . Av fistula placement Right 11/08/2012    Procedure: ARTERIOVENOUS (AV) FISTULA CREATION;  Surgeon: Chuck Hint, MD;  Location: [facility] OR;  Service: Vascular;  Laterality: Right;  . Hernia repair      Umbilical  . Surgery for fournier's gangrene  02/2012  . Colon surgery  13    colostomy   Family History  Problem Relation Age of Onset  . Hypertension Mother   . Hypertension Father   . Cancer Cousin     breast   Social History Unable to obtain post-surgery.  No Known Allergies Prior to Admission medications   Medication Sig Start Date End Date Taking? Authorizing Provider  allopurinol (ZYLOPRIM) 300 MG tablet Take 300 mg by mouth daily. 11/27/12  Yes Claudie Revering, NP  b complex-vitamin c-folic acid (NEPHRO-VITE) 0.8 MG TABS Take 0.8 mg by mouth at bedtime.   Yes Historical Provider, MD  calcium acetate (PHOSLO) 667 MG capsule Take 667-1,334 mg by mouth 6 (six) times daily. Take 2 capsules by mouth 3 times daily with meals and 1 capsule by mouth 3 times daily with snacks.   Yes Historical Provider, MD  erythromycin base (E-MYCIN) 500 MG tablet Take 500 mg by mouth 3 (three) times daily. Day before procedure.   Yes Historical Provider, MD  gabapentin (NEURONTIN) 100 MG capsule Take 100 mg by mouth 3 (three) times daily.   Yes Historical Provider, MD  lamivudine (EPIVIR) 100 MG tablet Take 0.5 tablets (50 mg total) by mouth daily. 12/06/12  Yes Judyann Munson, MD   lopinavir-ritonavir (KALETRA) 200-50 MG per tablet Take 2 tablets by mouth 2 (two) times daily. 12/06/12  Yes Judyann Munson, MD  Ostomy Supplies (SKIN PREP WIPES) MISC Use as Directed 12/30/12  Yes Kermit Balo, DO  Ostomy Supplies KIT Use as Directed 12/30/12  Yes Tiffany L Reed, DO  raltegravir (ISENTRESS) 400 MG tablet Take 1 tablet (400 mg total) by mouth 2 (two) times daily. 12/06/12  Yes Judyann Munson, MD  zidovudine (RETROVIR) 300 MG tablet Take 1 tablet (300 mg total) by mouth daily. 12/06/12  Yes Judyann Munson, MD   Labs:  Results for orders placed during the hospital encounter of 02/21/13 (from the past 48 hour(s))  POCT I-STAT 4, (NA,K, GLUC, HGB,HCT)     Status: Abnormal   Collection Time    02/21/13  6:21 AM      Result Value Range   Sodium 136  135 - 145 mEq/L   Potassium 4.0  3.5 - 5.1 mEq/L   Glucose, Bld 104 (*) 70 - 99 mg/dL   HCT 16.1  09.6 - 04.5 %   Hemoglobin 13.3  13.0 - 17.0 g/dL   Constitutional: negative for chills, fatigue, fevers and sweats Ears, nose, mouth, throat, and face: negative for earaches, hoarseness, nasal congestion and sore throat Respiratory: negative for cough, dyspnea on exertion, hemoptysis and sputum Cardiovascular: negative for chest pain, chest pressure/discomfort, dyspnea, orthopnea and palpitations Gastrointestinal: positive for post-surgical pain; negative for change in bowel habits, nausea and vomiting Genitourinary:negative, oliguric Musculoskeletal:negative for arthralgias, back pain, myalgias and neck pain Neurological: negative for dizziness, headaches, paresthesia and speech problems  Physical Exam: Filed Vitals:   02/21/13 1102  BP:   Pulse: 88  Temp:   Resp: 17     General appearance: alert, cooperative and no distress Head: Normocephalic, without obvious abnormality, atraumatic Neck: no adenopathy, no carotid bruit, no JVD and supple, symmetrical, trachea midline Resp: clear to auscultation bilaterally Cardio:  regular rate and rhythm, S1, S2 normal, no murmur, click, rub or gallop GI: + BS, soft with post-surgical dressing Extremities: extremities normal, atraumatic, no cyanosis or edema Neurologic: Grossly normal Dialysis Access: Right IJ catheter, AVF @ RUA with + bruit   Assessment/Plan: 1. Colostomy reversal - per Dr. Johna Sheriff today, stable post-surgery. 2. ESRD -  HD on TTS @ [place]; K 4 today.  Next HD tomorrow. 3. Hypertension/volume  - BP 152/87, no meds; reached EDW of 98.5 kg with HD yesterday. 4. Anemia  - Hgb 13.3, no outpatient Epogen or Fe. 5. Metabolic bone disease -  Last Ca 10.1, P 6.5, iPTH 95.2 on 5/29; no Hectorol, on Phoslo 2 with meals. 6. Nutrition - last Alb 3.7; renal diet, vitamin. 7. Dialysis access - Using R IJ catheter; AVF @ RFA placed 2/28, required venoplasty of 3 stenoses by Dr. Edilia Bo 4/28, will cannulate on return to center. 8. HIV - on antivirals per ID. 9. Hx Fournier's gangrene - 02/2012.  Bryan Gilmore 02/21/2013, 11:30 AM   Attending Nephrologist: Terrial Rhodes, MD

## 2013-02-21 NOTE — OR Nursing (Signed)
Lab notified of orders for labs

## 2013-02-21 NOTE — Preoperative (Signed)
Beta Blockers   Reason not to administer Beta Blockers:Not Applicable 

## 2013-02-21 NOTE — Anesthesia Preprocedure Evaluation (Addendum)
Anesthesia Evaluation   Patient awake    Reviewed: Allergy & Precautions, H&P , NPO status , Patient's Chart, lab work & pertinent test results  Airway Mallampati: II      Dental   Pulmonary neg pulmonary ROS,          Cardiovascular hypertension, + Peripheral Vascular Disease     Neuro/Psych    GI/Hepatic GERD-  ,(+) Hepatitis -, C  Endo/Other  negative endocrine ROS  Renal/GU Dialysis and ESRFRenal disease     Musculoskeletal   Abdominal   Peds  Hematology   Anesthesia Other Findings   Reproductive/Obstetrics                          Anesthesia Physical Anesthesia Plan  ASA: III  Anesthesia Plan: General   Post-op Pain Management:    Induction:   Airway Management Planned: Oral ETT  Additional Equipment:   Intra-op Plan:   Post-operative Plan: Possible Post-op intubation/ventilation  Informed Consent: I have reviewed the patients History and Physical, chart, labs and discussed the procedure including the risks, benefits and alternatives for the proposed anesthesia with the patient or authorized representative who has indicated his/her understanding and acceptance.   Dental advisory given  Plan Discussed with: CRNA, Anesthesiologist and Surgeon  Anesthesia Plan Comments:         Anesthesia Quick Evaluation

## 2013-02-21 NOTE — Progress Notes (Addendum)
Patient had a black bag that was labeled it is to big to put into patient belonging bag. Items sent to security noted on env number [id number]. Patient notified that anything valuable not locked up [place] could not be held liable for. Patient kept wallet at bedside but removed items he wanted sent to security including money

## 2013-02-21 NOTE — H&P (Signed)
Chief complaint: Colostomy   History: Patient is a 62 year old male with multiple medical problems as detailed below. He was hospitalized in June of 2013 for Fornier's gangrene gangrene. He was cared for on the urology service with multiple debridements and subsequently was transferred to [facility] for continued care. While hospitalized there he had a diverting sig;moid colostomy. He has made a steady recovery and is now living independently. He desires colostomy reversal. It is not causing any specific complication but he does not like the odor and care, etc.    Past Medical History   Diagnosis  Date   .  HIV (human immunodeficiency virus infection)     .  Sepsis(995.91)     .  Gout     .  Hepatitis C     .  Fournier's gangrene     .  Anemia     .  Severe protein-calorie malnutrition     .  Alcohol abuse     .  End stage renal disease         hemodialysis tues, thurs, sat   .  Dysrhythmia         AFib, not a candidate for coumadin , dr [place] signed off on pt   .  Hypertension     .  Colostomy status     .  Sebaceous cyst     .  Cough     .  Unspecified constipation     .  Gout, unspecified     .  GERD (gastroesophageal reflux disease)     .  Peripheral vascular disease, unspecified     .  Atrial fibrillation     .  Ulcer of lower limb, unspecified     .  Ulcer of lower limb, unspecified     .  Human immunodeficiency virus (HIV) disease     .  Other severe protein-calorie malnutrition     .  Disorders of phosphorus metabolism     .  Hypopotassemia     .  Anemia in chronic kidney disease(285.21)     .  Anemia in chronic kidney disease(285.21)     .  Anemia, unspecified     .  Hepatitis, unspecified     .  End stage renal disease     .  Altered mental status     .  Hemoptysis, unspecified     .  Acute respiratory failure     .  Gangrene     .  Sepsis(995.91)         Past Surgical History   Procedure  Laterality  Date   .  Cystoscopy with urethral dilatation     02/17/2012       Procedure: CYSTOSCOPY WITH URETHRAL DILATATION;  Surgeon: Martina Sinner, MD;  Location: [facility];  Service: Urology;  Laterality: N/A;  retrograde urethragram, placement of foley catheter exam under anesthesia    .  Insertion of dialysis catheter    02/20/2012       Procedure: INSERTION OF DIALYSIS CATHETER;  Surgeon: Chuck Hint, MD;  Location: [facility] OR;  Service: Vascular;  Laterality: N/A;   .  Irrigation and debridement abscess    02/29/2012       Procedure: IRRIGATION AND DEBRIDEMENT ABSCESS;  Surgeon: Martina Sinner, MD;  Location: [facility] OR;  Service: Urology;  Laterality: N/A;  I&D of scrotum abcess   .  Insertion of dialysis catheter    03/29/2012  Procedure: INSERTION OF DIALYSIS CATHETER;  Surgeon: Larina Earthly, MD;  Location: [facility] OR;  Service: Vascular;  Laterality: N/A;  Insertion tunneled dialysis catheter right IJ   .  Av fistula placement    04/12/2012       Procedure: INSERTION OF ARTERIOVENOUS (AV) GORE-TEX GRAFT ARM;  Surgeon: Larina Earthly, MD;  Location: [facility] OR;  Service: Vascular;  Laterality: Left;  insertion of 6x50 stretch goretex graft    .  Thrombectomy and revision of arterioventous (av) goretex  graft    07/15/2012       Procedure: THROMBECTOMY AND REVISION OF ARTERIOVENTOUS (AV) GORETEX  GRAFT;  Surgeon: Sherren Kerns, MD;  Location: [facility] OR;  Service: Vascular;  Laterality: Left;   .  Urethrogram    07/30/2012       Procedure: URETHROGRAM;  Surgeon: Martina Sinner, MD;  Location: [facility];  Service: Urology;  Laterality: N/A;  cystogram   .  Cystoscopy    07/30/2012       Procedure: CYSTOSCOPY;  Surgeon: Martina Sinner, MD;  Location: [facility];  Service: Urology;  Laterality: Bilateral;  retrograde   .  Insertion of dialysis catheter    08/04/2012       Procedure: INSERTION OF DIALYSIS CATHETER;  Surgeon: Chuck Hint, MD;  Location: [facility] OR;  Service: Vascular;  Laterality: Right;  right intrajugular hemodialysis catheter insertion    .  Av fistula placement    08/14/2012       Procedure: INSERTION OF ARTERIOVENOUS (AV) GORE-TEX GRAFT ARM;  Surgeon: Pryor Ochoa, MD;  Location: [facility] OR;  Service: Vascular;  Laterality: Left;   .  Colostomy    02/2012       d/t an infection   .  Av fistula placement  Right  11/08/2012       Procedure: ARTERIOVENOUS (AV) FISTULA CREATION;  Surgeon: Chuck Hint, MD;  Location: [facility] OR;  Service: Vascular;  Laterality: Right;   .  Hernia repair           Umbilical   .  Surgery for fournier's gangrene    02/2012       Current Outpatient Prescriptions   Medication  Sig  Dispense  Refill   .  allopurinol (ZYLOPRIM) 300 MG tablet  Take 300 mg by mouth daily.         Marland Kitchen  b complex-vitamin c-folic acid (NEPHRO-VITE) 0.8 MG TABS  Take 0.8 mg by mouth at bedtime.         .  calcium acetate (PHOSLO) 667 MG capsule  Take 667-1,334 mg by mouth 6 (six) times daily. Take 2 capsules by mouth 3 times daily with meals and 1 capsule by mouth 3 times daily with snacks.         .  gabapentin (NEURONTIN) 100 MG capsule  Take 100 mg by mouth 3 (three) times daily.         Marland Kitchen  lamivudine (EPIVIR) 100 MG tablet  Take 0.5 tablets (50 mg total) by mouth daily.   30 tablet   6   .  lopinavir-ritonavir (KALETRA) 200-50 MG per tablet  Take 2 tablets by mouth 2 (two) times daily.   120 tablet   11   .  raltegravir (ISENTRESS) 400 MG tablet  Take 1 tablet (400 mg total) by mouth 2 (two) times daily.   60 tablet   11   .  zidovudine (RETROVIR) 300 MG tablet  Take 1 tablet (  300 mg total) by mouth daily.   30 tablet   11       No current facility-administered medications for this visit.      No Known Allergies History   Substance Use Topics   .  Smoking status:  Never Smoker    .  Smokeless tobacco:  Never Used   .  Alcohol Use:  No         Comment: 9 months      Exam: BP 115/80  Pulse 71  Temp(Src) 98 F (36.7 C) (Oral)  Resp 18  SpO2 98%  General: Alert somewhat overweight Afro-American male Skin:  Healed small wounds over both lower extremities. No open wounds or rash infection HEENT: Sclerae nonicteric. Oropharynx clear with poor dentition. No masses. Lungs: Clear equal breath sounds without increased work of breathing Cardiac: Irregular rhythm. Soft systolic murmur. No edema. Abdomen: Colostomy left lower quadrant which appears to be an end colostomy. Midline incision well-healed. Soft and nontender. No masses or organomegaly. Rectal: Normal tone, no masses Extremities: Functioning fistula right arm. No edema. Neurologic: He is alert and fully oriented. Gait normal.   Assessment and plan: 62 year old male with multiple medical problems including end-stage renal disease, HIV status and history of encephalopathy that appears resolved. He has had a diverting colostomy secondary to Fournier's gangrene. He strongly desires colostomy reversal. I discussed with him that this is a major surgery with risks of infection and cardiopulmonary complications and even some risk of death. We discussed that he would be at increased risk of infectious complications based on his renal disease and HIV status. After discussion he understands all these issues and feels very strongly that he wants his colostomy reversed. I do not see any absolute contraindications. Pre op barium enema was normal. He is now electively admitted for colostomy takedown.  He had a mechanical and antibiotic bowel prop at home.  Mariella Saa MD, FACS  02/21/2013, 7:06 AM

## 2013-02-21 NOTE — Anesthesia Postprocedure Evaluation (Signed)
  Anesthesia Post-op Note  Patient: Bryan Gilmore.  Procedure(s) Performed: Procedure(s) with comments: COLOSTOMY CLOSURE (N/A) - takedown of sigmoid colostomy with anastomosis SMALL BOWEL RESECTION (N/A)  Patient Location: PACU  Anesthesia Type:General  Level of Consciousness: awake  Airway and Oxygen Therapy: Patient Spontanous Breathing  Post-op Pain: mild  Post-op Assessment: Post-op Vital signs reviewed  Post-op Vital Signs: Reviewed  Complications: No apparent anesthesia complications

## 2013-02-21 NOTE — Transfer of Care (Signed)
Immediate Anesthesia Transfer of Care Note  Patient: Bryan Gilmore.  Procedure(s) Performed: Procedure(s) with comments: COLOSTOMY CLOSURE (N/A) - takedown of sigmoid colostomy with anastomosis SMALL BOWEL RESECTION (N/A)  Patient Location: PACU  Anesthesia Type:General  Level of Consciousness: awake, alert  and oriented  Airway & Oxygen Therapy: Patient Spontanous Breathing and Patient connected to nasal cannula oxygen  Post-op Assessment: Report given to PACU RN and Post -op Vital signs reviewed and stable  Post vital signs: Reviewed and stable  Complications: No apparent anesthesia complications

## 2013-02-21 NOTE — Consult Note (Signed)
I have seen and examined this patient and agree with plan as outlined by Gerome Apley, PA-C.  Pt is s/p reversal of colostomy.  Will cont with HD qTTS while he remains an inpt.  No heparin with HD tomorrow.  Cont with oupt meds/rx. Brysyn Brandenberger A,MD 02/21/2013 3:30 PM

## 2013-02-21 NOTE — Op Note (Signed)
Preoperative Diagnosis: colostomy status  Postoprative Diagnosis: colostomy status and smalll bowel mass  Procedure: Procedure(s): COLOSTOMY CLOSURE SMALL BOWEL RESECTION   Surgeon: Glenna Fellows T   Assistants: Darnell Level  Anesthesia:  General endotracheal anesthesia  Indications:   The patient is a 62 year old male with multiple medical problems including end-stage renal disease, hepatitis C and HIV status. In 2013 he had severe Fournier's gangrene and while at a long-term care facility underwent open diverting sigmoid colostomy.  He has fully recovered from this acute illness and strongly desires reversal of his colostomy. We have had extensive discussion regarding the nature of the procedure and significant risks of anesthetic complications, bleeding, infection, anastomotic leak and chance of death. He understands completely and strongly desires to proceed. He has had a barium enema to find the anatomy and showing no abnormalities in the colon. Following a mechanical and antibiotic bowel prep at home the patient is brought to the operating room for this procedure.  Procedure Detail:  The patient was brought to the operating room, placed in the supine position on the operating table, and general endotracheal anesthesia induced. He received preoperative IV antibiotics. PAS were placed. He was carefully positioned and padded in the semi-lithotomy position. Foley catheter was placed a small amount of urine returned. The left lower quadrant ostomy was sutured closed with a pursestring silk suture. The abdomen and perineum were widely sterilely prepped and draped. Patient time out was performed and correct procedure verified. The previous midline incision skirting the umbilicus was used and dissection carried down through the subcutaneous tissue. A small incisional hernia at the umbilicus was encountered and the peritoneum entered. The fascia above and below this was incised along the midline  and omental adhesions taken down with cautery and sharp dissection. There were fortunately minimal intra-abdominal adhesions. There were some filmy small bowel adhesions were completely lysed. The proximal colon was freed to the abdominal wall the ostomy site and was divided at the level of the abdominal wall with the GIA stapler. The proximal colon was further mobilized dividing some lateral peritoneal attachments and some adhesions to the omentum so it could be brought down toward the pelvis. The distal colonic segment was easily identified. The end was grasped and elevated and further adhesions lysed so that it could be brought nicely up out of the pelvis an anastomosis could be performed under no tension. While lysing small bowel adhesions we encountered an approximately 2 cm mass in the jejunum which was firm and whitish and partially protruding through the bowel wall. There was no adenopathy. The remainder of the small bowel was normal. We felt that this needed to be resected due to the possibility of neoplasm. We initially then performed the limited small bowel resection dividing the small bowel several centimeters proximal and distal to the mass with the GIA stapler and the mesentery of the involved segment was divided between clamps and tied with 2-0 silk ties and the specimen removed and sent for frozen section. This was significant for a calcified polyp as well as a small area of ectopic pancreatic tissue. A functional end-to-end anastomosis was created with the GIA stapler and the common enterotomy was closed with the TA 60 stapler. This was airtight to pressure it appeared well perfused. The mesenteric defect was closed with interrupted silks and the proximal anastomosis reinforced with a 2-0 silk. Following that the proximal and distal colon were resected back about 2 cm in each direction back to soft healthy bowel. A functional  end-to-end anastomosis was then created with full thickness inverting 2-0  silk sutures. At the completion of the anastomosis Dr. Gerrit Friends went below and tensely inflated the rectosigmoid with air with the rigid sigmoidoscope and with the bowel clamp proximal to the anastomosis and under saline irrigation there was no evidence of leak. The abdomen was irrigated and all instruments and gloves and gowns changed.  The viscera returned to their anatomic position. Midline fascia was closed with running #1 loop PDS beginning at either end of the incision in addition to interrupted #1 Novafil sutures. We had mobilized to a small degree the fascia at the umbilicus due to the small hernia. This closed under no tension. The subcutaneous tissue was irrigated and the skin closed with staples. This wound was isolated and the colostomy site was then elliptically excised and the colon at the ostomy dissected away from the subcutaneous tissue with cautery down to the level of the fascia which was divided directly off the bowel and the entire remainder of the ostomy removed. The size was healthy and was closed transversely with interrupted #1 Novafil. This wound was irrigated and also closed with staples. Sponge needle and instrument counts were correct  Findings: As above  Estimated Blood Loss:  Minimal         Drains: none  Blood Given: none          Specimens: #1 portion of small intestine #2 colostomy and portions of sigmoid colon        Complications:  * No complications entered in OR log *         Disposition: PACU - hemodynamically stable.         Condition: stable

## 2013-02-22 LAB — CBC
MCH: 34 pg (ref 26.0–34.0)
MCV: 98.6 fL (ref 78.0–100.0)
Platelets: 132 10*3/uL — ABNORMAL LOW (ref 150–400)
RDW: 16.5 % — ABNORMAL HIGH (ref 11.5–15.5)

## 2013-02-22 LAB — BASIC METABOLIC PANEL
CO2: 19 mEq/L (ref 19–32)
Calcium: 9.1 mg/dL (ref 8.4–10.5)
Creatinine, Ser: 6.87 mg/dL — ABNORMAL HIGH (ref 0.50–1.35)
GFR calc non Af Amer: 8 mL/min — ABNORMAL LOW (ref >90)
Glucose, Bld: 87 mg/dL (ref 70–99)
Sodium: 133 mEq/L — ABNORMAL LOW (ref 135–145)

## 2013-02-22 LAB — ALBUMIN: Albumin: 3 g/dL — ABNORMAL LOW (ref 3.5–5.2)

## 2013-02-22 NOTE — Progress Notes (Signed)
Subjective:  Seen on dialysis, no complaints post-surgery yesterday  Objective: Vital signs in last 24 hours: Temp:  [97.6 F (36.4 C)-98.7 F (37.1 C)] 98.2 F (36.8 C) (06/14 0715) Pulse Rate:  [45-101] 89 (06/14 0735) Resp:  [10-34] 19 (06/14 0735) BP: (133-163)/(72-100) 163/100 mmHg (06/14 0735) SpO2:  [80 %-100 %] 97 % (06/14 0715) Weight:  [97.841 kg (215 lb 11.2 oz)-98.3 kg (216 lb 11.4 oz)] 98.3 kg (216 lb 11.4 oz) (06/14 0715) Weight change:   Intake/Output from previous day: 06/13 0701 - 06/14 0700 In: 900 [I.V.:900] Out: 100 [Blood:100]   EXAM: General appearance:  Alert, in no apparent distress Resp: CTA without rales, rhonchi, or wheezes Cardio:  RRR without murmur or rub GI: + BS, soft and nontender Extremities:  No edema Access:  Right IJ catheter with BFR 400 cc/min, AVF @ RUA with + bruit  Lab Results:  Recent Labs  02/21/13 1210 02/22/13 0555  WBC 8.2 6.0  HGB 12.4* 12.1*  HCT 35.2* 35.1*  PLT 152 132*   BMET:  Recent Labs  02/19/13 0943  02/21/13 1210 02/22/13 0555  NA 138  < > 136 133*  K 3.7  < > 4.4 5.0  CL 96  --  97 98  CO2 27  --  25 19  GLUCOSE 87  < > 104* 87  BUN 38*  --  29* 40*  CREATININE 6.51*  --  5.66* 6.87*  CALCIUM 9.6  --  9.5 9.1  ALBUMIN 3.5  --   --   --   < > = values in this interval not displayed. No results found for this basename: PTH,  in the last 72 hours Iron Studies: No results found for this basename: IRON, TIBC, TRANSFERRIN, FERRITIN,  in the last 72 hours  Dialysis Orders: Center: [place] on TTS.  EDW 98.5 kg HD Bath 2K/2Ca Time 4 hrs Heparin 8600 U. Access Right IJ catheter, AVF @ RFA (to be cannulated post-hospitalization) BFR 450 DFR A1.5 Hectorol 0 mcg IV/HD Epogen 0 Units IV/HD Venofer   Assessment/Plan: 1. Colostomy reversal - per Bryan Gilmore 6/13, stable.  2. ESRD - HD on TTS @ [facility]; K 5 pre-HD today. HD today.  3. Hypertension/volume - BP 163/102, no meds; current wt 98.3 kg with EDW 98.5, but UF  goal increased to 3.5 L secondary to high BP.  4. Anemia - Hgb 12.1, no outpatient Epogen or Fe.  5. Metabolic bone disease - Ca 9.1 (9.5 corrected), P 5.9, iPTH 95.2 (5/29); no Hectorol, on Phoslo 2 with meals.  6. Nutrition - last Alb 3.7; renal diet, vitamin.  7. Dialysis access - Using R IJ catheter; AVF @ RFA placed 2/28, required venoplasty of 3 stenoses by Dr. Edilia Bo 4/28, will cannulate on return to center.  8. HIV - on antivirals per ID.  9. Hx Fournier's gangrene - 02/2012    LOS: 1 day   Bryan Gilmore 02/22/2013,7:55 AM

## 2013-02-22 NOTE — Progress Notes (Signed)
I have seen and examined this patient and agree with plan as outlined by Gerome Apley, PA-C.  C/o some abd discomfort. Patient was seen on dialysis and the procedure was supervised. BFR 400 Via RIJ PC BP is 141/82.  Patient appears to be tolerating treatment well  . Demyan Fugate A,MD 02/22/2013 9:43 AM

## 2013-02-23 ENCOUNTER — Inpatient Hospital Stay (HOSPITAL_COMMUNITY): Payer: BC Managed Care – PPO

## 2013-02-23 LAB — CBC
MCH: 34 pg (ref 26.0–34.0)
MCHC: 34.1 g/dL (ref 30.0–36.0)
MCV: 99.7 fL (ref 78.0–100.0)
Platelets: 142 10*3/uL — ABNORMAL LOW (ref 150–400)
RDW: 16.3 % — ABNORMAL HIGH (ref 11.5–15.5)

## 2013-02-23 LAB — COMPREHENSIVE METABOLIC PANEL
AST: 47 U/L — ABNORMAL HIGH (ref 0–37)
Albumin: 3.1 g/dL — ABNORMAL LOW (ref 3.5–5.2)
Calcium: 9.8 mg/dL (ref 8.4–10.5)
Creatinine, Ser: 5.38 mg/dL — ABNORMAL HIGH (ref 0.50–1.35)
GFR calc non Af Amer: 10 mL/min — ABNORMAL LOW (ref >90)
Sodium: 134 mEq/L — ABNORMAL LOW (ref 135–145)
Total Protein: 8 g/dL (ref 6.0–8.3)

## 2013-02-23 NOTE — Progress Notes (Signed)
2 Days Post-Op  Subjective: No flatus feels bloated.  No vomiting.   Objective: Vital signs in last 24 hours: Temp:  [98.5 F (36.9 C)-99.1 F (37.3 C)] 98.5 F (36.9 C) (06/15 0610) Pulse Rate:  [87-115] 98 (06/15 0610) Resp:  [18] 18 (06/15 0610) BP: (77-142)/(50-97) 130/78 mmHg (06/15 0610) SpO2:  [97 %-100 %] 100 % (06/15 0610) Weight:  [209 lb 7 oz (95 kg)-215 lb 13.3 oz (97.9 kg)] 215 lb 13.3 oz (97.9 kg) (06/15 0610) Last BM Date:  (pre op)  Intake/Output from previous day: 06/14 0701 - 06/15 0700 In: 770 [I.V.:770] Out: 2050  Intake/Output this shift:    Incision/Wound:intact with min serous drainage.  Distended sore but not rigid and has no rebound.   Lab Results:   Recent Labs  02/21/13 1210 02/22/13 0555  WBC 8.2 6.0  HGB 12.4* 12.1*  HCT 35.2* 35.1*  PLT 152 132*   BMET  Recent Labs  02/21/13 1210 02/22/13 0555  NA 136 133*  K 4.4 5.0  CL 97 98  CO2 25 19  GLUCOSE 104* 87  BUN 29* 40*  CREATININE 5.66* 6.87*  CALCIUM 9.5 9.1   PT/INR No results found for this basename: LABPROT, INR,  in the last 72 hours ABG No results found for this basename: PHART, PCO2, PO2, HCO3,  in the last 72 hours  Studies/Results: No results found.  Anti-infectives: Anti-infectives   Start     Dose/Rate Route Frequency Ordered Stop   02/21/13 0600  cefOXitin (MEFOXIN) 2 g in dextrose 5 % 50 mL IVPB     2 g 100 mL/hr over 30 Minutes Intravenous On call to O.R. 02/20/13 1422 02/21/13 0736      Assessment/Plan: s/p Procedure(s) with comments: COLOSTOMY CLOSURE (N/A) - takedown of sigmoid colostomy with anastomosis SMALL BOWEL RESECTION (N/A) No flatus and distended.  Not vomiting but will hold off on po until he passes gas.  Ambulate to halls Check KUB Check labs NG if he vomits  LOS: 2 days    Bryan Gilmore A. 02/23/2013

## 2013-02-23 NOTE — Progress Notes (Signed)
Subjective:  Feeling bloated, uncomfortable, no BM or flatus since surgery  Objective: Vital signs in last 24 hours: Temp:  [98.5 F (36.9 C)-99.1 F (37.3 C)] 98.5 F (36.9 C) (06/15 0610) Pulse Rate:  [87-115] 98 (06/15 0610) Resp:  [18] 18 (06/15 0610) BP: (77-142)/(50-97) 130/78 mmHg (06/15 0610) SpO2:  [97 %-100 %] 100 % (06/15 0610) Weight:  [95 kg (209 lb 7 oz)-97.9 kg (215 lb 13.3 oz)] 97.9 kg (215 lb 13.3 oz) (06/15 0610) Weight change: 0.459 kg (1 lb 0.2 oz)  Intake/Output from previous day: 06/14 0701 - 06/15 0700 In: 770 [I.V.:770] Out: 2050    EXAM: General appearance:  Alert, in no apparent distress Resp:  CTA without rales, rhonchi, or wheezes Cardio:  RRR without murmur or rub GI:  Distended, slight general tenderness Extremities:  No edema Access:  Right IJ catheter, AVF @ RUA with + bruit  Lab Results:  Recent Labs  02/21/13 1210 02/22/13 0555  WBC 8.2 6.0  HGB 12.4* 12.1*  HCT 35.2* 35.1*  PLT 152 132*   BMET:  Recent Labs  02/21/13 1210 02/22/13 0555 02/22/13 0930  NA 136 133*  --   K 4.4 5.0  --   CL 97 98  --   CO2 25 19  --   GLUCOSE 104* 87  --   BUN 29* 40*  --   CREATININE 5.66* 6.87*  --   CALCIUM 9.5 9.1  --   ALBUMIN  --   --  3.0*   No results found for this basename: PTH,  in the last 72 hours Iron Studies: No results found for this basename: IRON, TIBC, TRANSFERRIN, FERRITIN,  in the last 72 hours  Dialysis Orders: Center: [place] on TTS.  EDW 98.5 kg HD Bath 2K/2Ca Time 4 hrs Heparin 8600 U. Access Right IJ catheter, AVF @ RFA (to be cannulated post-hospitalization) BFR 450 DFR A1.5 Hectorol 0 mcg IV/HD Epogen 0 Units IV/HD Venofer 0   Assessment/Plan: 1. Colostomy reversal - per Dr. Johna Sheriff 6/13, abdomen distended, no BM or flatus.  KUB pending. 2. ESRD - HD on TTS @ [facility]; K 5 pre-HD yesterday.  Next HD on 6/17.  3. Hypertension/volume - BP 130/78, no meds; wt 97.9 kg s/p net UF 2 L yesterday, but did not tolerate  challenge despite initial high BPs, which fell quickly.  Maintain EDW 98.5.  4. Anemia - Hgb 12.1, no outpatient Epogen or Fe.  5. Metabolic bone disease - Ca 9.1 (9.9 corrected), P 7.2, iPTH 95.2 (5/29); no Hectorol, on Phoslo 2 with meals.  6. Nutrition - last Alb 3; renal diet, vitamin.  7. Dialysis access - Using R IJ catheter; AVF @ RFA placed 2/28, required venoplasty of 3 stenoses by Dr. Edilia Bo 4/28, will cannulate on return to center.  8. HIV - on antivirals per ID.  9. Hx Fournier's gangrene - 02/2012.    LOS: 2 days   Bryan Gilmore 02/23/2013,9:54 AM

## 2013-02-23 NOTE — Progress Notes (Signed)
I have seen and examined this patient and agree with plan as outlined by Gerome Apley, PA-C.  Mr. Winders is much more distended today.  No flatulence/BM.  KUB per [facility]. Cont with HD as regular schedule. Ayleen Mckinstry A,MD 02/23/2013 11:09 AM

## 2013-02-24 NOTE — Progress Notes (Signed)
Subjective:Still feeling bloated, uncomfortable, no BM or flatus since surgery. Says if he could just "get this gas out and get and enema" he thinks he'd be alright  Objective:  Vital signs in last 24 hours:  Temp: [98.5 F (36.9 C)-99.1 F (37.3 C)] 98.5 F (36.9 C) (06/15 0610)  Pulse Rate: [87-115] 98 (06/15 0610)  Resp: [18] 18 (06/15 0610)  BP: (77-142)/(50-97) 130/78 mmHg (06/15 0610)  SpO2: [97 %-100 %] 100 % (06/15 0610)  Weight: [95 kg (209 lb 7 oz)-97.9 kg (215 lb 13.3 oz)] 97.9 kg (215 lb 13.3 oz) (06/15 0610)  Weight change: 0.459 kg (1 lb 0.2 oz)  Intake/Output from previous day:  06/14 0701 - 06/15 0700  In: 770 [I.V.:770]  Out: 2050   EXAM:  General appearance: Alert, in no apparent distress  Resp: CTA without rales, rhonchi, or wheezes  Cardio: RRR without murmur or rub  GI: Distended, slight general tenderness , no BS  heard Extremities: No edema  Access: Right IJ catheter, AVF @ RUA with + bruit  Lab Results:   Recent Labs   02/21/13 1210  02/22/13 0555   WBC  8.2  6.0   HGB  12.4*  12.1*   HCT  35.2*  35.1*   PLT  152  132*    BMET:  Recent Labs   02/21/13 1210  02/22/13 0555  02/22/13 0930   NA  136  133*  --   K  4.4  5.0  --   CL  97  98  --   CO2  25  19  --   GLUCOSE  104*  87  --   BUN  29*  40*  --   CREATININE  5.66*  6.87*  --   CALCIUM  9.5  9.1  --   ALBUMIN  --  --  3.0*    Dialysis Orders: Center: [place] on TTS.  EDW 98.5 kg HD Bath 2K/2Ca Time 4 hrs Heparin 8600 U. Access Right IJ catheter, AVF @ RFA (to be cannulated post-hospitalization) BFR 450 DFR A1.5 Hectorol 0 mcg IV/HD Epogen 0 Units IV/HD Venofer none   Assessment/Plan:  1. Colostomy reversal, Dr. Johna Sheriff 6/13- abdomen remains distended, no BM or flatus. KUB showed ileus 2. ESRD, cont TTS HD 3. HTN/volume- 1kg up by weight, UF same at HD tomorrow 4. Anemia - Hgb 12.1, no outpatient Epogen or Fe.  5. Metabolic bone disease- Ca 9.1 (9.9 corrected), P 7.2, iPTH 95.2  (5/29); no Hectorol, on Phoslo 2 with meals.  6. Nutrition - last Alb 3; renal diet, vitamin.  7. Dialysis access - Using R IJ catheter; AVF @ RFA placed 2/28, required venoplasty of 3 stenoses by Dr. Edilia Bo 4/28, will cannulate on return to center.  8. HIV - on antivirals per ID.  9. Hx Fournier's gangrene - 02/2012   Vinson Moselle  MD [phone] pgr    [phone] cell 02/24/2013, 12:42 PM

## 2013-02-24 NOTE — Progress Notes (Signed)
Patient ID: Bryan Gilmore., male   DOB: [birth year], 62 y.o.   MRN: [id number] 3 Days Post-Op  Subjective: Not feeling well due to abdominal swelling.  No N/V, no flatus yet.  Some pain around incision but not severe and better than yesterday.  Has been walking  Objective: Vital signs in last 24 hours: Temp:  [98.1 F (36.7 C)-98.5 F (36.9 C)] 98.1 F (36.7 C) (06/16 0549) Pulse Rate:  [93-100] 93 (06/16 0549) Resp:  [18] 18 (06/16 0549) BP: (139-150)/(81-91) 149/91 mmHg (06/16 0549) SpO2:  [96 %-100 %] 100 % (06/16 0549) Weight:  [219 lb 12.8 oz (99.7 kg)] 219 lb 12.8 oz (99.7 kg) (06/16 0549) Last BM Date:  (pre op)  Intake/Output from previous day: 06/15 0701 - 06/16 0700 In: 450 [I.V.:450] Out: 0  Intake/Output this shift:    General appearance: alert, cooperative and no distress Resp: clear to auscultation bilaterally GI: abnormal findings:  distended, hypoactive bowel sounds and very minimal tenderness without guarding Incision/Wound: Clean without drainage or other signs of infection  Lab Results:   Recent Labs  02/22/13 0555 02/23/13 1020  WBC 6.0 8.8  HGB 12.1* 11.6*  HCT 35.1* 34.0*  PLT 132* 142*   BMET  Recent Labs  02/22/13 0555 02/23/13 1020  NA 133* 134*  K 5.0 4.3  CL 98 96  CO2 19 23  GLUCOSE 87 76  BUN 40* 33*  CREATININE 6.87* 5.38*  CALCIUM 9.1 9.8     Studies/Results: Dg Abd 1 View  02/23/2013   *RADIOLOGY REPORT*  Clinical Data: Abdominal distention.  Postop from colostomy takedown.  ABDOMEN - 1 VIEW  Comparison: None.  Findings: A small amount of residual contrast is seen in the rectosigmoid colon.  There is generalized gaseous distention of the small bowel and colon, consistent with dynamic ileus.  Skin staples are noted in the lower abdominal wall.  IMPRESSION: Postop ileus pattern.   Original Report Authenticated By: Myles Rosenthal, M.D.    Anti-infectives: Anti-infectives   Start     Dose/Rate Route Frequency Ordered Stop   02/21/13 0600  cefOXitin (MEFOXIN) 2 g in dextrose 5 % 50 mL IVPB     2 g 100 mL/hr over 30 Minutes Intravenous On call to O.R. 02/20/13 1422 02/21/13 0736      Assessment/Plan: s/p Procedure(s): COLOSTOMY CLOSURE SMALL BOWEL RESECTION Stable Post op ileus, not unexpected. Cont NPO for now.  Ambulation encouraged ESRD, renal followowing for dialysis needs   LOS: 3 days    Breann Losano T 02/24/2013

## 2013-02-25 ENCOUNTER — Encounter (HOSPITAL_COMMUNITY): Payer: Self-pay | Admitting: General Surgery

## 2013-02-25 LAB — BASIC METABOLIC PANEL
CO2: 21 mEq/L (ref 19–32)
Calcium: 8.7 mg/dL (ref 8.4–10.5)
GFR calc Af Amer: 10 mL/min — ABNORMAL LOW (ref >90)
Sodium: 136 mEq/L (ref 135–145)

## 2013-02-25 LAB — CBC
MCH: 34.1 pg — ABNORMAL HIGH (ref 26.0–34.0)
Platelets: 162 10*3/uL (ref 150–400)
RBC: 2.99 MIL/uL — ABNORMAL LOW (ref 4.22–5.81)
RDW: 16.4 % — ABNORMAL HIGH (ref 11.5–15.5)
WBC: 8.8 10*3/uL (ref 4.0–10.5)

## 2013-02-25 MED ORDER — METOPROLOL TARTRATE 1 MG/ML IV SOLN: 5.0000 mg | Freq: Four times a day (QID) | INTRAVENOUS | Status: DC | PRN

## 2013-02-25 MED ORDER — NEPRO/CARBSTEADY PO LIQD
237.0000 mL | ORAL | Status: DC | PRN
  Filled 2013-02-25: qty 237

## 2013-02-25 MED ORDER — LIDOCAINE-PRILOCAINE 2.5-2.5 % EX CREA
1.0000 "application " | TOPICAL_CREAM | CUTANEOUS | Status: DC | PRN
  Filled 2013-02-25: qty 5

## 2013-02-25 MED ORDER — LIDOCAINE HCL (PF) 1 % IJ SOLN
5.0000 mL | INTRAMUSCULAR | Status: DC | PRN
  Filled 2013-02-25: qty 5

## 2013-02-25 MED ORDER — SODIUM CHLORIDE 0.9 % IV SOLN: 100.0000 mL | INTRAVENOUS | Status: DC | PRN

## 2013-02-25 MED ORDER — ALTEPLASE 2 MG IJ SOLR
2.0000 mg | Freq: Once | INTRAMUSCULAR | Status: AC | PRN
  Filled 2013-02-25: qty 2

## 2013-02-25 MED ORDER — HEPARIN SODIUM (PORCINE) 1000 UNIT/ML DIALYSIS
1000.0000 [IU] | INTRAMUSCULAR | Status: DC | PRN
  Filled 2013-02-25: qty 1

## 2013-02-25 MED ORDER — PENTAFLUOROPROP-TETRAFLUOROETH EX AERO: 1.0000 "application " | INHALATION_SPRAY | CUTANEOUS | Status: DC | PRN

## 2013-02-25 NOTE — Progress Notes (Signed)
Subjective: passed some flatus, no BM yet  Objective:  Vital signs in last 24 hours:  Temp: [98.5 F (36.9 C)-99.1 F (37.3 C)] 98.5 F (36.9 C) (06/15 0610)  Pulse Rate: [87-115] 98 (06/15 0610)  Resp: [18] 18 (06/15 0610)  BP: (77-142)/(50-97) 130/78 mmHg (06/15 0610)  SpO2: [97 %-100 %] 100 % (06/15 0610)  Weight: [95 kg (209 lb 7 oz)-97.9 kg (215 lb 13.3 oz)] 97.9 kg (215 lb 13.3 oz) (06/15 0610)  Weight change: 0.459 kg (1 lb 0.2 oz)  Intake/Output from previous day:  06/14 0701 - 06/15 0700  In: 770 [I.V.:770]  Out: 2050   Lab Results:   Recent Labs   02/21/13 1210  02/22/13 0555   WBC  8.2  6.0   HGB  12.4*  12.1*   HCT  35.2*  35.1*   PLT  152  132*    BMET:  Recent Labs   02/21/13 1210  02/22/13 0555  02/22/13 0930   NA  136  133*  --   K  4.4  5.0  --   CL  97  98  --   CO2  25  19  --   GLUCOSE  104*  87  --   BUN  29*  40*  --   CREATININE  5.66*  6.87*  --   CALCIUM  9.5  9.1  --   ALBUMIN  --  --  3.0*    EXAM:  Gen: Alert, in no apparent distress  Resp: CTA without rales, rhonchi, or wheezes  Cardio: RRR without murmur or rub  GI: Distended, slight general tenderness , no BS  heard Extremities: No edema  Access: Right IJ catheter, AVF @ RUA with + bruit   Dialysis Orders: Center: [place] on TTS.  EDW 98.5 kg HD Bath 2K/2Ca Time 4 hrs Heparin 8600 U. Access Right IJ catheter, AVF @ RFA (to be cannulated post-hospitalization) BFR 450 DFR A1.5 Hectorol 0 mcg IV/HD Epogen 0 Units IV/HD Venofer none   Assessment/Plan:  1. Colostomy reversal, Dr. Johna Sheriff 6/13- post op ileus, per primary 2. ESRD, cont TTS HD 3. HTN/volume- HD today 4. Anemia - Hgb 12.1, no outpatient Epogen or Fe.  5. Metabolic bone disease- Ca 9.1 (9.9 corrected), P 7.2, iPTH 95.2 (5/29); no Hectorol, on Phoslo 2 with meals.  6. Nutrition - last Alb 3; renal diet, vitamin.  7. Dialysis access - Using R IJ catheter; AVF @ RFA placed 2/28, required venoplasty of 3 stenoses by Dr.  Edilia Bo 4/28, will cannulate on return to center.  8. HIV - on antivirals per ID.  9. Hx Fournier's gangrene - 02/2012   Vinson Moselle  MD [phone] pgr    [phone] cell 02/25/2013, 10:35 AM

## 2013-02-25 NOTE — Procedures (Signed)
I was present at this dialysis session. I have reviewed the session itself and made appropriate changes.   Vinson Moselle, MD [employer] 02/25/2013, 10:35 AM

## 2013-02-25 NOTE — Progress Notes (Signed)
Patient ID: Bryan Ferrin., male   DOB: 02-23-1951, 62 y.o.   MRN: [id number] 4 Days Post-Op  Subjective: States he feels better today but had one episode of vomiting a couple of hours ago. Abdomen still feels tight to him but no severe pain. He states he passed a small amount of flatus but no bowel movements. Back from dialysis. Has been ambulatory.  Objective: Vital signs in last 24 hours: Temp:  [97.6 F (36.4 C)-98 F (36.7 C)] 97.7 F (36.5 C) (06/17 1358) Pulse Rate:  [88-104] 102 (06/17 1358) Resp:  [16-20] 18 (06/17 1358) BP: (125-162)/(70-111) 148/99 mmHg (06/17 1358) SpO2:  [96 %-100 %] 100 % (06/17 1358) Weight:  [215 lb 13.3 oz (97.9 kg)-218 lb 4.1 oz (99 kg)] 215 lb 13.3 oz (97.9 kg) (06/17 1135) Last BM Date:  (prior to surgery via colostomy)  Intake/Output from previous day: 06/16 0701 - 06/17 0700 In: 801 [I.V.:801] Out: -  Intake/Output this shift: Total I/O In: 0  Out: 750 [Urine:250; Other:500]  General appearance: alert, cooperative and no distress GI: remains moderately distended but not tense, no change from yesterday. Minimal tenderness. Incision/Wound: dressing intact and wound appears clean without unusual drainage or erythema  Lab Results:   Recent Labs  02/23/13 1020 02/25/13 0540  WBC 8.8 8.8  HGB 11.6* 10.2*  HCT 34.0* 29.9*  PLT 142* 162   BMET  Recent Labs  02/23/13 1020 02/25/13 0740  NA 134* 136  K 4.3 4.1  CL 96 97  CO2 23 21  GLUCOSE 76 101*  BUN 33* 54*  CREATININE 5.38* 6.46*  CALCIUM 9.8 8.7     Studies/Results: No results found.  Anti-infectives: Anti-infectives   Start     Dose/Rate Route Frequency Ordered Stop   02/21/13 0600  cefOXitin (MEFOXIN) 2 g in dextrose 5 % 50 mL IVPB     2 g 100 mL/hr over 30 Minutes Intravenous On call to O.R. 02/20/13 1422 02/21/13 0736      Assessment/Plan: s/p Procedure(s): COLOSTOMY CLOSURE SMALL BOWEL RESECTION Still with postoperative ileus. May need an NG tube if  vomiting continues. He otherwise appears stable. Hypertension. Will add low dose IV Lopressor.   LOS: 4 days    Ivelis Norgard T 02/25/2013

## 2013-02-26 NOTE — Progress Notes (Signed)
Subjective: + BM's  Objective:  Vital signs in last 24 hours:  Temp: [98.5 F (36.9 C)-99.1 F (37.3 C)] 98.5 F (36.9 C) (06/15 0610)  Pulse Rate: [87-115] 98 (06/15 0610)  Resp: [18] 18 (06/15 0610)  BP: (77-142)/(50-97) 130/78 mmHg (06/15 0610)  SpO2: [97 %-100 %] 100 % (06/15 0610)  Weight: [95 kg (209 lb 7 oz)-97.9 kg (215 lb 13.3 oz)] 97.9 kg (215 lb 13.3 oz) (06/15 0610)  Weight change: 0.459 kg (1 lb 0.2 oz)  Intake/Output from previous day:  06/14 0701 - 06/15 0700  In: 770 [I.V.:770]  Out: 2050   Lab Results:   Recent Labs   02/21/13 1210  02/22/13 0555   WBC  8.2  6.0   HGB  12.4*  12.1*   HCT  35.2*  35.1*   PLT  152  132*    BMET:  Recent Labs   02/21/13 1210  02/22/13 0555  02/22/13 0930   NA  136  133*  --   K  4.4  5.0  --   CL  97  98  --   CO2  25  19  --   GLUCOSE  104*  87  --   BUN  29*  40*  --   CREATININE  5.66*  6.87*  --   CALCIUM  9.5  9.1  --   ALBUMIN  --  --  3.0*    EXAM:  Gen: Alert, in no apparent distress  Resp: CTA without rales, rhonchi, or wheezes  Cardio: RRR without murmur or rub  GI: +BS , less distended Extremities: No edema  Access: Right IJ catheter, AVF @ RUA with + bruit   Dialysis Orders: Center: [facility] on TTS.  EDW 98.5 kg HD Bath 2K/2Ca Time 4 hrs Heparin 8600 U. Access Right IJ catheter, AVF @ RFA (to be cannulated post-hospitalization) BFR 450 DFR A1.5 Hectorol 0 mcg IV/HD Epogen 0 Units IV/HD Venofer none   Assessment/Plan:  1. Colostomy reversal, Dr. Johna Sheriff 6/13- ileus resolving, possibly home tomorrow 2. ESRD, cont TTS HD 3. HTN/volume- HD tomorrow, no fluid off , below dry wt 4. Anemia - Hgb 12's, no outpatient Epogen or Fe.  5. Metabolic bone disease- Ca 9.1 (9.9 corrected), P 7.2, iPTH 95.2 (5/29); no Hectorol, on Phoslo 2 with meals.  6. Nutrition - last Alb 3; renal diet, vitamin.  7. Dialysis access - Using R IJ catheter; AVF @ RFA placed 2/28, required venoplasty of 3 stenoses by Dr. Edilia Bo  4/28, will cannulate on return to center.  8. HIV - on antivirals per ID.  9. Hx Fournier's gangrene - 02/2012   Vinson Moselle  MD [phone] pgr    [phone] cell 02/26/2013, 12:28 PM

## 2013-02-26 NOTE — Progress Notes (Signed)
Patient ID: Bryan Hengst., male   DOB: 04-10-1951, 62 y.o.   MRN: [id number] 5 Days Post-Op  Subjective: Feels much better today.  Had several BMs.  Distention resolved, no pain or nausea  Objective: Vital signs in last 24 hours: Temp:  [97.6 F (36.4 C)-98.4 F (36.9 C)] 97.6 F (36.4 C) (06/18 0549) Pulse Rate:  [86-102] 86 (06/18 0549) Resp:  [18] 18 (06/18 0549) BP: (121-162)/(67-99) 121/67 mmHg (06/18 0549) SpO2:  [96 %-100 %] 100 % (06/18 0549) Weight:  [213 lb 6.5 oz (96.8 kg)-215 lb 13.3 oz (97.9 kg)] 213 lb 6.5 oz (96.8 kg) (06/18 0549) Last BM Date: 02/25/13  Intake/Output from previous day: 06/17 0701 - 06/18 0700 In: 0  Out: 750 [Urine:250] Intake/Output this shift:    General appearance: alert, cooperative and no distress GI: normal findings: soft, non-tender Incision/Wound: No drainage or erythema  Lab Results:   Recent Labs  02/23/13 1020 02/25/13 0540  WBC 8.8 8.8  HGB 11.6* 10.2*  HCT 34.0* 29.9*  PLT 142* 162   BMET  Recent Labs  02/23/13 1020 02/25/13 0740  NA 134* 136  K 4.3 4.1  CL 96 97  CO2 23 21  GLUCOSE 76 101*  BUN 33* 54*  CREATININE 5.38* 6.46*  CALCIUM 9.8 8.7     Studies/Results: No results found.  Anti-infectives: Anti-infectives   Start     Dose/Rate Route Frequency Ordered Stop   02/21/13 0600  cefOXitin (MEFOXIN) 2 g in dextrose 5 % 50 mL IVPB     2 g 100 mL/hr over 30 Minutes Intravenous On call to O.R. 02/20/13 1422 02/21/13 0736      Assessment/Plan: s/p Procedure(s): COLOSTOMY CLOSURE SMALL BOWEL RESECTION Doing well, ileus resolving FL diet, possibly home tomorrow   LOS: 5 days    Torrance Stockley T 02/26/2013

## 2013-02-27 LAB — CBC
MCV: 97.2 fL (ref 78.0–100.0)
Platelets: 167 10*3/uL (ref 150–400)
RBC: 3.16 MIL/uL — ABNORMAL LOW (ref 4.22–5.81)
WBC: 5.4 10*3/uL (ref 4.0–10.5)

## 2013-02-27 LAB — RENAL FUNCTION PANEL
Albumin: 2.4 g/dL — ABNORMAL LOW (ref 3.5–5.2)
CO2: 24 mEq/L (ref 19–32)
Chloride: 95 mEq/L — ABNORMAL LOW (ref 96–112)
Creatinine, Ser: 6.8 mg/dL — ABNORMAL HIGH (ref 0.50–1.35)
GFR calc Af Amer: 9 mL/min — ABNORMAL LOW (ref >90)
GFR calc non Af Amer: 8 mL/min — ABNORMAL LOW (ref >90)
Potassium: 2.8 mEq/L — ABNORMAL LOW (ref 3.5–5.1)
Sodium: 134 mEq/L — ABNORMAL LOW (ref 135–145)

## 2013-02-27 MED ORDER — HEPARIN SODIUM (PORCINE) 1000 UNIT/ML DIALYSIS: 1000.0000 [IU] | INTRAMUSCULAR | Status: DC | PRN

## 2013-02-27 MED ORDER — SODIUM CHLORIDE 0.9 % IV SOLN: 100.0000 mL | INTRAVENOUS | Status: DC | PRN

## 2013-02-27 MED ORDER — LIDOCAINE HCL (PF) 1 % IJ SOLN: 5.0000 mL | INTRAMUSCULAR | Status: DC | PRN

## 2013-02-27 MED ORDER — ALTEPLASE 2 MG IJ SOLR: 2.0000 mg | Freq: Once | INTRAMUSCULAR | Status: DC | PRN

## 2013-02-27 MED ORDER — OXYCODONE-ACETAMINOPHEN 5-325 MG PO TABS: 1.0000 | ORAL_TABLET | ORAL | Status: DC | PRN

## 2013-02-27 MED ORDER — POTASSIUM CHLORIDE CRYS ER 20 MEQ PO TBCR
20.0000 meq | EXTENDED_RELEASE_TABLET | Freq: Once | ORAL | Status: AC
  Administered 2013-02-27: 20 meq via ORAL
  Filled 2013-02-27: qty 1

## 2013-02-27 MED ORDER — PENTAFLUOROPROP-TETRAFLUOROETH EX AERO: 1.0000 "application " | INHALATION_SPRAY | CUTANEOUS | Status: DC | PRN

## 2013-02-27 MED ORDER — NEPRO/CARBSTEADY PO LIQD: 237.0000 mL | ORAL | Status: DC | PRN

## 2013-02-27 MED ORDER — LIDOCAINE-PRILOCAINE 2.5-2.5 % EX CREA: 1.0000 "application " | TOPICAL_CREAM | CUTANEOUS | Status: DC | PRN

## 2013-02-27 NOTE — Progress Notes (Signed)
Patient ID: Bryan Pracht., male   DOB: 01-Apr-1951, 62 y.o.   MRN: [id number] 6 Days Post-Op  Subjective: Seen at dialysis. No complaints of abdominal pain or nausea. Tolerating full liquids well. Had several loose bowel movements.  Objective: Vital signs in last 24 hours: Temp:  [98 F (36.7 C)-98.7 F (37.1 C)] 98 F (36.7 C) (06/19 1478) Pulse Rate:  [81-109] 100 (06/19 0830) Resp:  [16-20] 16 (06/19 0632) BP: (122-164)/(83-109) 131/83 mmHg (06/19 0830) SpO2:  [97 %-99 %] 97 % (06/19 2956) Weight:  [213 lb 10 oz (96.9 kg)-219 lb 5.7 oz (99.5 kg)] 213 lb 10 oz (96.9 kg) (06/19 2130) Last BM Date: 02/26/13  Intake/Output from previous day: 06/18 0701 - 06/19 0700 In: 240 [P.O.:240] Out: -  Intake/Output this shift:    General appearance: alert, cooperative and no distress GI: normal findings: soft, non-tender and and nondistended Incision/Wound: dressing removed. Staples and packed in incisions are clean and dry without erythema or drainage.  Lab Results:   Recent Labs  02/25/13 0540 02/27/13 0656  WBC 8.8 5.4  HGB 10.2* 10.8*  HCT 29.9* 30.7*  PLT 162 167   BMET  Recent Labs  02/25/13 0740 02/27/13 0656  NA 136 134*  K 4.1 2.8*  CL 97 95*  CO2 21 24  GLUCOSE 101* 98  BUN 54* 53*  CREATININE 6.46* 6.80*  CALCIUM 8.7 9.0     Studies/Results: No results found.  Anti-infectives: Anti-infectives   Start     Dose/Rate Route Frequency Ordered Stop   02/21/13 0600  cefOXitin (MEFOXIN) 2 g in dextrose 5 % 50 mL IVPB     2 g 100 mL/hr over 30 Minutes Intravenous On call to O.R. 02/20/13 1422 02/21/13 0736      Assessment/Plan: s/p Procedure(s): COLOSTOMY CLOSURE SMALL BOWEL RESECTION Doing well postoperatively without complication identified. Okay for discharge.   LOS: 6 days    Bryan Gilmore T 02/27/2013

## 2013-02-27 NOTE — Discharge Summary (Signed)
   Patient ID: Bryan Gilmore [id number] 62 y.o. 03/24/1951  02/21/2013  Discharge date and time: 02/27/2013   Admitting Physician: Glenna Fellows T  Discharge Physician: Glenna Fellows T  Admission Diagnoses: colostomy status  Discharge Diagnoses: same  Operations: Procedure(s): COLOSTOMY CLOSURE SMALL BOWEL RESECTION  Admission Condition: good  Discharged Condition: good  Indication for Admission: patient is a 62 year old male with a previous history of diverging end sigmoid colostomy due to severe Fournier's gangrene. He has completely recovered from this. He has multiple medical problems that are stable. He strongly desires colostomy reversal and after extensive discussion detail the elsewhere he is electively admitted for this procedure.  Hospital Course: patient was admitted on the morning of this procedure. He had undergone a mechanical and antibiotic bowel prep at home. He underwent reversal of his Hartmann colostomy. A mass in the small bowel was also noted that he underwent a limited small bowel resection. Pathology showed a calcified polyp. His postoperative course was relatively uncomplicated. He did have a somewhat prolonged ileus with some abdominal distention for about 4 days postoperatively. He then began having flatus and bowel movements on postop day 5 and his abdominal distention completely resolved. He was advanced to a full liquid diet which he tolerated well. At the time of discharge he is afebrile. No abdominal pain or nausea. His abdomen is nontender. Incisions are clean and dry without evidence of infection.  Consults: nephrology  Disposition: Home  Patient Instructions:    Medication List    TAKE these medications       allopurinol 300 MG tablet  Commonly known as:  ZYLOPRIM  Take 300 mg by mouth daily.     b complex-vitamin c-folic acid 0.8 MG Tabs  Take 0.8 mg by mouth at bedtime.     calcium acetate 667 MG capsule  Commonly known as:   PHOSLO  Take 667-1,334 mg by mouth 6 (six) times daily. Take 2 capsules by mouth 3 times daily with meals and 1 capsule by mouth 3 times daily with snacks.     erythromycin base 500 MG tablet  Commonly known as:  E-MYCIN  Take 500 mg by mouth 3 (three) times daily. Day before procedure.     gabapentin 100 MG capsule  Commonly known as:  NEURONTIN  Take 100 mg by mouth 3 (three) times daily.     lamivudine 100 MG tablet  Commonly known as:  EPIVIR  Take 0.5 tablets (50 mg total) by mouth daily.     lopinavir-ritonavir 200-50 MG per tablet  Commonly known as:  KALETRA  Take 2 tablets by mouth 2 (two) times daily.     Ostomy Supplies Kit  Use as Directed     SKIN PREP WIPES Misc  Use as Directed     oxyCODONE-acetaminophen 5-325 MG per tablet  Commonly known as:  ROXICET  Take 1 tablet by mouth every 4 (four) hours as needed for pain.     raltegravir 400 MG tablet  Commonly known as:  ISENTRESS  Take 1 tablet (400 mg total) by mouth 2 (two) times daily.     zidovudine 300 MG tablet  Commonly known as:  RETROVIR  Take 1 tablet (300 mg total) by mouth daily.        Activity: no heavy lifting for 4 weeks Diet: renal diet Wound Care: none needed  Follow-up:  With Dr. Johna Sheriff in 1 week.  Signed: Mariella Saa MD, FACS  02/27/2013, 8:36 AM

## 2013-02-27 NOTE — Progress Notes (Signed)
Pt has order to be discharged to home. Pt now reports having abdominal pain and would prefer to wait til  in the AM to be discharge instead. Dr. Andrey Campanile notified. Said to hold discharge for now. MD said will f/u in AM.

## 2013-02-28 NOTE — Discharge Planning (Signed)
Copy of instructions to pt who vebalizes understanding.  Will dc per SCAT, pt calling with all personal belongings.

## 2013-03-05 ENCOUNTER — Encounter (INDEPENDENT_AMBULATORY_CARE_PROVIDER_SITE_OTHER): Payer: Self-pay | Admitting: General Surgery

## 2013-03-05 ENCOUNTER — Ambulatory Visit (INDEPENDENT_AMBULATORY_CARE_PROVIDER_SITE_OTHER): Payer: Medicare Other | Admitting: General Surgery

## 2013-03-05 VITALS — BP 124/86 | HR 84 | Temp 97.0°F | Resp 16 | Ht 71.0 in | Wt 215.2 lb

## 2013-03-05 DIAGNOSIS — Z09 Encounter for follow-up examination after completed treatment for conditions other than malignant neoplasm: Secondary | ICD-10-CM

## 2013-03-05 NOTE — Progress Notes (Signed)
History: Patient returns 2 weeks following open takedown of his Hartmann colostomy. He reports no particular problems at home. He has some expected discomfort at the incision that is improving. His bowels are moving regularly. No fever or chills or abdominal pain or trouble with his incisions.  Exam: BP 124/86  Pulse 84  Temp(Src) 97 F (36.1 C)  Resp 16  Ht 5\' 11"  (1.803 m)  Wt 215 lb 3.2 oz (97.614 kg)  BMI 30.03 kg/m2 General: Appears well in no distress Abdomen: Soft and nontender. His midline and stoma site in incisions are nicely healed. Staples were removed.   Assessment and plan: Doing well following takedown of colostomy without apparent complication. Reviewed activity limitations. He will return in one month.

## 2013-03-12 DIAGNOSIS — N35919 Unspecified urethral stricture, male, unspecified site: Secondary | ICD-10-CM | POA: Diagnosis not present

## 2013-03-17 ENCOUNTER — Other Ambulatory Visit: Payer: Self-pay | Admitting: Nurse Practitioner

## 2013-03-19 ENCOUNTER — Other Ambulatory Visit: Payer: Self-pay | Admitting: *Deleted

## 2013-03-19 ENCOUNTER — Ambulatory Visit: Payer: Medicare Other

## 2013-03-19 ENCOUNTER — Encounter: Payer: Self-pay | Admitting: Internal Medicine

## 2013-03-19 ENCOUNTER — Ambulatory Visit (INDEPENDENT_AMBULATORY_CARE_PROVIDER_SITE_OTHER): Payer: Medicare Other | Admitting: Internal Medicine

## 2013-03-19 VITALS — BP 132/83 | HR 84 | Temp 97.9°F | Wt 214.0 lb

## 2013-03-19 DIAGNOSIS — I10 Essential (primary) hypertension: Secondary | ICD-10-CM

## 2013-03-19 DIAGNOSIS — N390 Urinary tract infection, site not specified: Secondary | ICD-10-CM

## 2013-03-19 DIAGNOSIS — M109 Gout, unspecified: Secondary | ICD-10-CM

## 2013-03-19 DIAGNOSIS — D649 Anemia, unspecified: Secondary | ICD-10-CM | POA: Diagnosis not present

## 2013-03-19 DIAGNOSIS — B20 Human immunodeficiency virus [HIV] disease: Secondary | ICD-10-CM | POA: Diagnosis not present

## 2013-03-19 DIAGNOSIS — E785 Hyperlipidemia, unspecified: Secondary | ICD-10-CM

## 2013-03-19 DIAGNOSIS — Z13220 Encounter for screening for lipoid disorders: Secondary | ICD-10-CM | POA: Diagnosis not present

## 2013-03-19 NOTE — Progress Notes (Signed)
[facility] NOTE  RFV: routine Subjective:    Patient ID: Bryan Carmine., male    DOB: 1950-10-14, 62 y.o.   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 He states that he is still having difficulty with port during dialysis. Right AVG (latest) clotted on first use thus still using his Hohn catheter. Scheduled to work on his AVG right forearm graft this coming Friday. Missed 3-4 days of medications while hospitalized due to being kept NPO after colostomy reversal surgery. He is doing well from his surgery. Having daily BM no difficulty with surgery incision healing. He has yet to change his regimen since medications have not yet arrived.   ROS: He is oligouric with 4 oz of urine per day, usually in the morning. Had cloudy urine.denies fever, chills, nightsweats, dysuria Saw his pcp for evaluation this morning    Current Outpatient Prescriptions on File Prior to Visit  Medication Sig Dispense Refill  . allopurinol (ZYLOPRIM) 300 MG tablet Take 300 mg by mouth daily.      Marland Kitchen b complex-vitamin c-folic acid (NEPHRO-VITE) 0.8 MG TABS Take 0.8 mg by mouth at bedtime.      . calcium acetate (PHOSLO) 667 MG capsule Take 667-1,334 mg by mouth 6 (six) times daily. Take 2 capsules by mouth 3 times daily with meals and 1 capsule by mouth 3 times daily with snacks.      Marland Kitchen erythromycin base (E-MYCIN) 500 MG tablet Take 500 mg by mouth 3 (three) times daily. Day before procedure.      . gabapentin (NEURONTIN) 100 MG capsule Take 100 mg by mouth 3 (three) times daily.      Marland Kitchen gabapentin (NEURONTIN) 100 MG capsule TAKE ONE CAPSULE BY MOUTH THREE TIMES DAILY  90 capsule  1  . lamivudine (EPIVIR) 100 MG tablet Take 0.5 tablets (50 mg total) by mouth daily.  30 tablet  6  . lopinavir-ritonavir (KALETRA)  200-50 MG per tablet Take 2 tablets by mouth 2 (two) times daily.  120 tablet  11  . Ostomy Supplies (SKIN PREP WIPES) MISC Use as Directed  50 each  6  . Ostomy Supplies KIT Use as Directed  1 each  6  . oxyCODONE-acetaminophen (ROXICET) 5-325 MG per tablet Take 1 tablet by mouth every 4 (four) hours as needed for pain.  40 tablet  0  . raltegravir (ISENTRESS) 400 MG tablet Take 1 tablet (400 mg total) by mouth 2 (two) times daily.  60 tablet  11  . zidovudine (RETROVIR) 300 MG tablet Take 1 tablet (300 mg total) by mouth daily.  30 tablet  11   No current facility-administered medications on file prior to visit.   Active Ambulatory Problems    Diagnosis Date Noted  . HIV INFECTION 09/15/2010  . HEPATITIS C 10/07/2010  . GOUT, CHRONIC 10/07/2010  . 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 Normocytic anemia 02/17/2012  . ESRD (end stage renal disease) 02/29/2012  . Anemia of chronic renal failure 02/29/2012  . Hyperkalemia 03/29/2012  . Secondary hyperparathyroidism (of renal origin) 03/29/2012  . Fever 04/01/2012  . Delirium 04/01/2012  . Atrial fibrillation, permanent 04/04/2012  . Alcohol abuse   . Metabolic encephalopathy 04/06/2012  . Fournier's gangrene in male 06/05/2012  . End stage renal disease 10/30/2012  . Aftercare  following surgery of the circulatory system, NEC 12/18/2012  . Colostomy status 12/26/2012  . Other complications due to renal dialysis device, implant, and graft 01/29/2013   Resolved Ambulatory Problems    Diagnosis Date Noted  . Decreased libido 10/07/2010  . Sepsis secondary to suspected Fornier's gangrene 02/17/2012  . ARF (acute renal failure) in the setting of stage III CKD 02/17/2012  . Metabolic acidosis 02/17/2012  . Hyperkalemia 02/17/2012  . Hyponatremia 02/17/2012  . Acute respiratory failure 02/17/2012   Past Medical History  Diagnosis Date  . HIV (human immunodeficiency virus infection)   . Gout   . Hepatitis C   . Fournier's  gangrene   . Anemia   . Severe protein-calorie malnutrition   . Dysrhythmia   . Hypertension   . Sebaceous cyst   . Cough   . Unspecified constipation   . Gout, unspecified   . GERD (gastroesophageal reflux disease)   . Atrial fibrillation   . Ulcer of lower limb, unspecified   . Ulcer of lower limb, unspecified   . Other severe protein-calorie malnutrition   . Disorders of phosphorus metabolism   . Hypopotassemia   . Anemia in chronic kidney disease(285.21)   . Anemia in chronic kidney disease(285.21)   . Anemia, unspecified   . Hepatitis, unspecified   . Altered mental status   . Hemoptysis, unspecified   . Gangrene   . Peripheral vascular disease, unspecified   . Arthritis     Review of Systems 12 point ROS reviewed, positive pertinents listed in hpi    Objective:   Physical Exam BP 132/83  Pulse 84  Temp(Src) 97.9 F (36.6 C) (Oral)  Wt 214 lb (97.07 kg)  BMI 29.86 kg/m2 Physical Exam  Constitutional: He is oriented to person, place, and time. He appears well-developed and well-nourished. No distress.  HENT:  Mouth/Throat: Oropharynx is clear and moist. No oropharyngeal exudate.  Cardiovascular: Normal rate, regular rhythm and normal heart sounds. Exam reveals no gallop and no friction rub.  No murmur heard.  Pulmonary/Chest: Effort normal and breath sounds normal. No respiratory distress. He has no wheezes.  Abdominal: Soft. Bowel sounds are normal. He exhibits no distension. There is no tenderness. Well healed surgical incision in LLQ and midline Lymphadenopathy:  no cervical adenopathy.  Neurological: He is alert and oriented to person, place, and time.  Skin: Skin is warm and dry. No rash noted. No erythema.  Ext: right AVG +thrill, non tender        Assessment & Plan:   HIV = change his regimen to isentress- darunavir-ritonavir- lamivudine-zidovudine for the  20th, coordinate with pharmacy the change regimen   rtc in 6 wks.

## 2013-03-20 LAB — URINALYSIS
Nitrite, UA: NEGATIVE
Specific Gravity, UA: 1.017 (ref 1.005–1.030)
Urobilinogen, Ur: 0.2 mg/dL (ref 0.0–1.9)
pH, UA: 6.5 (ref 5.0–7.5)

## 2013-03-20 LAB — LIPID PANEL
Cholesterol, Total: 125 mg/dL (ref 100–199)
Triglycerides: 113 mg/dL (ref 0–149)

## 2013-03-20 LAB — CBC WITH DIFFERENTIAL/PLATELET
Basophils Absolute: 0 10*3/uL (ref 0.0–0.2)
Eosinophils Absolute: 0.1 10*3/uL (ref 0.0–0.4)
Hemoglobin: 8 g/dL — ABNORMAL LOW (ref 12.6–17.7)
Immature Grans (Abs): 0 10*3/uL (ref 0.0–0.1)
Immature Granulocytes: 0 % (ref 0–2)
Lymphs: 27 % (ref 14–46)
MCH: 33.6 pg — ABNORMAL HIGH (ref 26.6–33.0)
MCHC: 32.9 g/dL (ref 31.5–35.7)
Monocytes: 9 % (ref 4–12)
Neutrophils Relative %: 62 % (ref 40–74)
RDW: 15.8 % — ABNORMAL HIGH (ref 12.3–15.4)

## 2013-03-20 LAB — COMPREHENSIVE METABOLIC PANEL
ALT: 17 IU/L (ref 0–44)
AST: 23 IU/L (ref 0–40)
Albumin/Globulin Ratio: 1 — ABNORMAL LOW (ref 1.1–2.5)
Alkaline Phosphatase: 82 IU/L (ref 39–117)
BUN/Creatinine Ratio: 3 — ABNORMAL LOW (ref 10–22)
Calcium: 9.4 mg/dL (ref 8.6–10.2)
GFR calc non Af Amer: 12 mL/min/{1.73_m2} — ABNORMAL LOW (ref >59)
Potassium: 3.2 mmol/L — ABNORMAL LOW (ref 3.5–5.2)
Sodium: 138 mmol/L (ref 134–144)
Total Bilirubin: 0.7 mg/dL (ref 0.0–1.2)

## 2013-03-20 LAB — URINE CULTURE

## 2013-03-20 LAB — URIC ACID: Uric Acid: 3.3 mg/dL — ABNORMAL LOW (ref 3.7–8.6)

## 2013-03-24 ENCOUNTER — Other Ambulatory Visit: Payer: Self-pay | Admitting: Licensed Clinical Social Worker

## 2013-03-24 ENCOUNTER — Ambulatory Visit: Payer: Medicare Other | Admitting: Nurse Practitioner

## 2013-03-24 DIAGNOSIS — B20 Human immunodeficiency virus [HIV] disease: Secondary | ICD-10-CM

## 2013-03-24 MED ORDER — ZIDOVUDINE 300 MG PO TABS: 300.0000 mg | ORAL_TABLET | Freq: Every day | ORAL | Status: DC

## 2013-03-24 MED ORDER — LAMIVUDINE 100 MG PO TABS: 50.0000 mg | ORAL_TABLET | Freq: Every day | ORAL | Status: DC

## 2013-03-24 MED ORDER — RALTEGRAVIR POTASSIUM 400 MG PO TABS: 400.0000 mg | ORAL_TABLET | Freq: Two times a day (BID) | ORAL | Status: DC

## 2013-03-24 MED ORDER — LOPINAVIR-RITONAVIR 200-50 MG PO TABS: 2.0000 | ORAL_TABLET | Freq: Two times a day (BID) | ORAL | Status: DC

## 2013-03-25 ENCOUNTER — Encounter: Payer: Self-pay | Admitting: *Deleted

## 2013-03-25 ENCOUNTER — Ambulatory Visit: Payer: Medicare Other | Admitting: Nurse Practitioner

## 2013-03-26 ENCOUNTER — Encounter: Payer: Self-pay | Admitting: Nurse Practitioner

## 2013-03-26 ENCOUNTER — Other Ambulatory Visit: Payer: Self-pay | Admitting: *Deleted

## 2013-03-26 ENCOUNTER — Ambulatory Visit (INDEPENDENT_AMBULATORY_CARE_PROVIDER_SITE_OTHER): Payer: Medicare Other | Admitting: Nurse Practitioner

## 2013-03-26 ENCOUNTER — Encounter (INDEPENDENT_AMBULATORY_CARE_PROVIDER_SITE_OTHER): Payer: Medicare Other | Admitting: General Surgery

## 2013-03-26 VITALS — BP 138/87 | HR 78 | Temp 98.1°F | Resp 18 | Wt 220.0 lb

## 2013-03-26 DIAGNOSIS — B20 Human immunodeficiency virus [HIV] disease: Secondary | ICD-10-CM

## 2013-03-26 DIAGNOSIS — D539 Nutritional anemia, unspecified: Secondary | ICD-10-CM

## 2013-03-26 DIAGNOSIS — D631 Anemia in chronic kidney disease: Secondary | ICD-10-CM

## 2013-03-26 DIAGNOSIS — N184 Chronic kidney disease, stage 4 (severe): Secondary | ICD-10-CM

## 2013-03-26 DIAGNOSIS — R300 Dysuria: Secondary | ICD-10-CM | POA: Diagnosis not present

## 2013-03-26 DIAGNOSIS — N186 End stage renal disease: Secondary | ICD-10-CM

## 2013-03-26 DIAGNOSIS — D6489 Other specified anemias: Secondary | ICD-10-CM

## 2013-03-26 DIAGNOSIS — D649 Anemia, unspecified: Secondary | ICD-10-CM

## 2013-03-26 MED ORDER — DARUNAVIR ETHANOLATE 800 MG PO TABS: 800.0000 mg | ORAL_TABLET | Freq: Every day | ORAL | Status: DC

## 2013-03-26 MED ORDER — RITONAVIR 100 MG PO TABS: 100.0000 mg | ORAL_TABLET | Freq: Every day | ORAL | Status: DC

## 2013-03-26 NOTE — Progress Notes (Signed)
Patient ID: Bryan Verno., male   DOB: 1950/10/17, 62 y.o.   MRN: [id number]   No Known Allergies  Chief Complaint  Patient presents with  . Follow-up    osteomy reversed, labs results,    HPI: Patient is a 62 y.o. male seen in the office today for routine follow up  Since last visit pt has colostomy reversal- following up with surgery on the 7/28 and is doing well since surgery Reports pain with urination- however urine sample showed mixed flora  dialysis- T, T, Sat  Review of Systems:  Review of Systems  Constitutional: Negative for fever, chills and malaise/fatigue.  Respiratory: Negative for cough and shortness of breath.   Cardiovascular: Negative for chest pain and leg swelling.  Gastrointestinal: Negative for heartburn, abdominal pain, diarrhea and constipation.  Genitourinary: Positive for dysuria. Negative for urgency and frequency.  Skin: Negative.   Neurological: Negative for weakness.  Psychiatric/Behavioral: Negative for depression. The patient is not nervous/anxious and does not have insomnia.      Past Medical History  Diagnosis Date  . HIV (human immunodeficiency virus infection)   . Sepsis(995.91)   . Gout   . Hepatitis C   . Fournier's gangrene   . Anemia   . Severe protein-calorie malnutrition   . Alcohol abuse   . Dysrhythmia     AFib, not a candidate for coumadin , dr [place] signed off on pt  . Hypertension   . Colostomy status   . Sebaceous cyst   . Cough   . Unspecified constipation   . Gout, unspecified   . GERD (gastroesophageal reflux disease)   . Atrial fibrillation   . Ulcer of lower limb, unspecified   . Ulcer of lower limb, unspecified   . Human immunodeficiency virus (HIV) disease   . Other severe protein-calorie malnutrition   . Disorders of phosphorus metabolism   . Hypopotassemia   . Anemia in chronic kidney disease(285.21)   . Anemia in chronic kidney disease(285.21)   . Anemia, unspecified   . Hepatitis, unspecified   .  Altered mental status   . Hemoptysis, unspecified   . Acute respiratory failure   . Gangrene   . Sepsis(995.91)   . Peripheral vascular disease, unspecified     denies  . End stage renal disease     hemodialysis tues, thurs, sat-east fornesia  . End stage renal disease   . Arthritis     gout   Past Surgical History  Procedure Laterality Date  . Cystoscopy with urethral dilatation  02/17/2012    Procedure: CYSTOSCOPY WITH URETHRAL DILATATION;  Surgeon: Martina Sinner, MD;  Location: [facility];  Service: Urology;  Laterality: N/A;  retrograde urethragram, placement of foley catheter exam under anesthesia   . Insertion of dialysis catheter  02/20/2012    Procedure: INSERTION OF DIALYSIS CATHETER;  Surgeon: Chuck Hint, MD;  Location: [facility] OR;  Service: Vascular;  Laterality: N/A;  . Irrigation and debridement abscess  02/29/2012    Procedure: IRRIGATION AND DEBRIDEMENT ABSCESS;  Surgeon: Martina Sinner, MD;  Location: [facility] OR;  Service: Urology;  Laterality: N/A;  I&D of scrotum abcess  . Insertion of dialysis catheter  03/29/2012    Procedure: INSERTION OF DIALYSIS CATHETER;  Surgeon: Larina Earthly, MD;  Location: [facility] OR;  Service: Vascular;  Laterality: N/A;  Insertion tunneled dialysis catheter right IJ  . Av fistula placement  04/12/2012    Procedure: INSERTION OF ARTERIOVENOUS (AV) GORE-TEX GRAFT ARM;  Surgeon: Tawanna Cooler  Shirline Frees, MD;  Location: [facility] OR;  Service: Vascular;  Laterality: Left;  insertion of 6x50 stretch goretex graft   . Thrombectomy and revision of arterioventous (av) goretex  graft  07/15/2012    Procedure: THROMBECTOMY AND REVISION OF ARTERIOVENTOUS (AV) GORETEX  GRAFT;  Surgeon: Sherren Kerns, MD;  Location: [facility] OR;  Service: Vascular;  Laterality: Left;  . Urethrogram  07/30/2012    Procedure: URETHROGRAM;  Surgeon: Martina Sinner, MD;  Location: [facility];  Service: Urology;  Laterality: N/A;  cystogram  . Cystoscopy  07/30/2012    Procedure: CYSTOSCOPY;  Surgeon:  Martina Sinner, MD;  Location: [facility];  Service: Urology;  Laterality: Bilateral;  retrograde  . Insertion of dialysis catheter  08/04/2012    Procedure: INSERTION OF DIALYSIS CATHETER;  Surgeon: Chuck Hint, MD;  Location: [facility] OR;  Service: Vascular;  Laterality: Right;  right intrajugular hemodialysis catheter insertion  . Av fistula placement  08/14/2012    Procedure: INSERTION OF ARTERIOVENOUS (AV) GORE-TEX GRAFT ARM;  Surgeon: Pryor Ochoa, MD;  Location: [facility] OR;  Service: Vascular;  Laterality: Left;  . Colostomy  02/2012    d/t an infection  . Av fistula placement Right 11/08/2012    Procedure: ARTERIOVENOUS (AV) FISTULA CREATION;  Surgeon: Chuck Hint, MD;  Location: [facility] OR;  Service: Vascular;  Laterality: Right;  . Hernia repair      Umbilical  . Surgery for fournier's gangrene  02/2012  . Colon surgery  13    colostomy  . Colostomy closure  02/21/2013  . Colostomy closure N/A 02/21/2013    Procedure: COLOSTOMY CLOSURE;  Surgeon: Mariella Saa, MD;  Location: [facility] OR;  Service: General;  Laterality: N/A;  takedown of sigmoid colostomy with anastomosis  . Bowel resection N/A 02/21/2013    Procedure: SMALL BOWEL RESECTION;  Surgeon: Mariella Saa, MD;  Location: [facility] OR;  Service: General;  Laterality: N/A;   Social History:   reports that he has never smoked. He has never used smokeless tobacco. He reports that he does not drink alcohol or use illicit drugs.  Family History  Problem Relation Age of Onset  . Hypertension Mother   . Hypertension Father   . Cancer Cousin     breast    Medications: Patient's Medications  New Prescriptions   No medications on file  Previous Medications   ALLOPURINOL (ZYLOPRIM) 300 MG TABLET    Take 300 mg by mouth daily.   B COMPLEX-VITAMIN C-FOLIC ACID (NEPHRO-VITE) 0.8 MG TABS    Take 0.8 mg by mouth at bedtime.   CALCIUM ACETATE (PHOSLO) 667 MG CAPSULE    Take 667-1,334 mg by mouth 6 (six) times daily. Take 2  capsules by mouth 3 times daily with meals and 1 capsule by mouth 3 times daily with snacks.   GABAPENTIN (NEURONTIN) 100 MG CAPSULE    TAKE ONE CAPSULE BY MOUTH THREE TIMES DAILY   LAMIVUDINE (EPIVIR) 100 MG TABLET    Take 0.5 tablets (50 mg total) by mouth daily.   OXYCODONE-ACETAMINOPHEN (ROXICET) 5-325 MG PER TABLET    Take 1 tablet by mouth every 4 (four) hours as needed for pain.   RALTEGRAVIR (ISENTRESS) 400 MG TABLET    Take 1 tablet (400 mg total) by mouth 2 (two) times daily.   RITONAVIR (NORVIR) 100 MG TABS    Take 1 tablet (100 mg total) by mouth daily.   ZIDOVUDINE (RETROVIR) 300 MG TABLET    Take 1 tablet (  300 mg total) by mouth daily.  Modified Medications   No medications on file  Discontinued Medications   DARUNAVIR ETHANOLATE (PREZISTA) 800 MG TABLET    Take 1 tablet (800 mg total) by mouth daily with breakfast.   ERYTHROMYCIN BASE (E-MYCIN) 500 MG TABLET    Take 500 mg by mouth 3 (three) times daily. Day before procedure.   GABAPENTIN (NEURONTIN) 100 MG CAPSULE    Take 100 mg by mouth 3 (three) times daily.   OSTOMY SUPPLIES (SKIN PREP WIPES) MISC    Use as Directed   OSTOMY SUPPLIES KIT    Use as Directed     Physical Exam:  Filed Vitals:   03/26/13 1619  BP: 138/87  Pulse: 78  Temp: 98.1 F (36.7 C)  TempSrc: Oral  Resp: 18  Weight: 220 lb (99.791 kg)  SpO2: 96%    Physical Exam  Vitals reviewed. Constitutional: He is oriented to person, place, and time and well-developed, well-nourished, and in no distress. No distress.  HENT:  Head: Normocephalic and atraumatic.  Eyes: Conjunctivae and EOM are normal. Pupils are equal, round, and reactive to light.  Neck: Normal range of motion. Neck supple. No thyromegaly present.  Cardiovascular: Normal rate, regular rhythm and normal heart sounds.   Pulmonary/Chest: Effort normal and breath sounds normal. No respiratory distress.  Abdominal: Soft. Bowel sounds are normal. He exhibits no distension. There is no  tenderness.  Musculoskeletal: Normal range of motion. He exhibits no edema and no tenderness.  Neurological: He is alert and oriented to person, place, and time.  Skin: Skin is warm and dry. He is not diaphoretic.  Psychiatric: Affect normal.    Labs reviewed: Basic Metabolic Panel:  Recent Labs  78/29/56 0526  04/05/12 1130  04/13/12 0740  02/22/13 0930  02/25/13 0740 02/27/13 0656 03/19/13 1011  NA 138  < >  --   < > 136  < >  --   < > 136 134* 138  K 3.6  < >  --   < > 4.2  < >  --   < > 4.1 2.8* 3.2*  CL 98  < >  --   < > 97  < >  --   < > 97 95* 96*  CO2 26  < >  --   < > 25  < >  --   < > 21 24 25   GLUCOSE 90  < >  --   < > 110*  < >  --   < > 101* 98 84  BUN 10  < >  --   < > 62*  < >  --   < > 54* 53* 16  CREATININE 3.35*  < >  --   < > 5.07*  < >  --   < > 6.46* 6.80* 4.97*  CALCIUM 9.6  < >  --   < > 9.7  < >  --   < > 8.7 9.0 9.4  MG  --   --  2.0  --   --   --   --   --   --   --   --   PHOS 4.0  < >  --   < > 5.9*  --  7.2*  --   --  5.2*  --   TSH 6.222*  --   --   --   --   --   --   --   --   --   --   < > =  values in this interval not displayed. Liver Function Tests:  Recent Labs  02/19/13 0943 02/22/13 0930 02/23/13 1020 02/27/13 0656 03/19/13 1011  AST 32  --  47*  --  23  ALT 37  --  38  --  17  ALKPHOS 77  --  66  --  82  BILITOT 0.7  --  3.2*  --  0.7  PROT 8.7*  --  8.0  --  7.3  ALBUMIN 3.5 3.0* 3.1* 2.4*  --    No results found for this basename: LIPASE, AMYLASE,  in the last 8760 hours  Recent Labs  03/30/12 0928 04/05/12 1135  AMMONIA 15 14   CBC:  Recent Labs  02/23/13 1020 02/25/13 0540 02/27/13 0656 03/19/13 1011  WBC 8.8 8.8 5.4 7.3  NEUTROABS  --   --   --  4.5  HGB 11.6* 10.2* 10.8* 8.0*  HCT 34.0* 29.9* 30.7* 24.3*  MCV 99.7 100.0 97.2 102*  PLT 142* 162 167  --    Lipid Panel:  Recent Labs  03/19/13 1011  HDL 41  LDLCALC 61  TRIG 113  CHOLHDL 3.0     Assessment/Plan  1.   Dysuria 788.1   - will  recollect UA c&S- pt did not provide clean catch at last visit   2.anemia 285.9   -  with ESRD- worse in the last month- no signs of bleeding; will get B12 and folate    3.   ESRD (end stage renal disease) 585.6   conts dialysis T, T, sat   4.   HIV INFECTION - currently back on antivirals and following with ID

## 2013-03-26 NOTE — Patient Instructions (Signed)
Bring clean catch urine sample

## 2013-03-27 ENCOUNTER — Other Ambulatory Visit: Payer: Self-pay | Admitting: Geriatric Medicine

## 2013-03-27 DIAGNOSIS — D649 Anemia, unspecified: Secondary | ICD-10-CM

## 2013-03-28 ENCOUNTER — Other Ambulatory Visit: Payer: Medicare Other

## 2013-03-28 DIAGNOSIS — R300 Dysuria: Secondary | ICD-10-CM | POA: Diagnosis not present

## 2013-03-28 DIAGNOSIS — D539 Nutritional anemia, unspecified: Secondary | ICD-10-CM | POA: Diagnosis not present

## 2013-03-28 DIAGNOSIS — D649 Anemia, unspecified: Secondary | ICD-10-CM | POA: Diagnosis not present

## 2013-03-29 LAB — URINALYSIS
Nitrite, UA: NEGATIVE
Specific Gravity, UA: 1.018 (ref 1.005–1.030)
pH, UA: 7.5 (ref 5.0–7.5)

## 2013-03-29 LAB — URINE CULTURE

## 2013-04-01 ENCOUNTER — Other Ambulatory Visit: Payer: Self-pay | Admitting: Geriatric Medicine

## 2013-04-07 DIAGNOSIS — N390 Urinary tract infection, site not specified: Secondary | ICD-10-CM | POA: Diagnosis not present

## 2013-04-16 ENCOUNTER — Encounter (INDEPENDENT_AMBULATORY_CARE_PROVIDER_SITE_OTHER): Payer: Self-pay | Admitting: General Surgery

## 2013-04-16 ENCOUNTER — Ambulatory Visit (INDEPENDENT_AMBULATORY_CARE_PROVIDER_SITE_OTHER): Payer: Medicare Other | Admitting: General Surgery

## 2013-04-16 VITALS — BP 130/82 | HR 76 | Resp 16 | Ht 71.0 in | Wt 213.4 lb

## 2013-04-16 DIAGNOSIS — Z09 Encounter for follow-up examination after completed treatment for conditions other than malignant neoplasm: Secondary | ICD-10-CM

## 2013-04-16 NOTE — Progress Notes (Signed)
History: Patient returns for more long-term followup over one month following takedown of Hartmann colostomy. He reports no problems, specifically abdominal pain or difficulty with bowel movements.  Exam: BP 130/82  Pulse 76  Resp 16  Ht 5\' 11"  (1.803 m)  Wt 213 lb 6.4 oz (96.798 kg)  BMI 29.78 kg/m2 General: Appears well Abdomen: Soft and nontender. Incisions well healed. No hernias or infection  Assessment and plan: Doing well without apparent complication. He is discharged return as needed without limitations.

## 2013-05-01 ENCOUNTER — Ambulatory Visit: Payer: BC Managed Care – PPO | Admitting: Internal Medicine

## 2013-05-06 IMAGING — CR DG CHEST 2V
2 series · 2 of 2 positions shown · non-contrast
Comparison: 03/29/2012

CLINICAL DATA: Fever and shortness of breath

CHEST - 2 VIEW

[w chest pa]
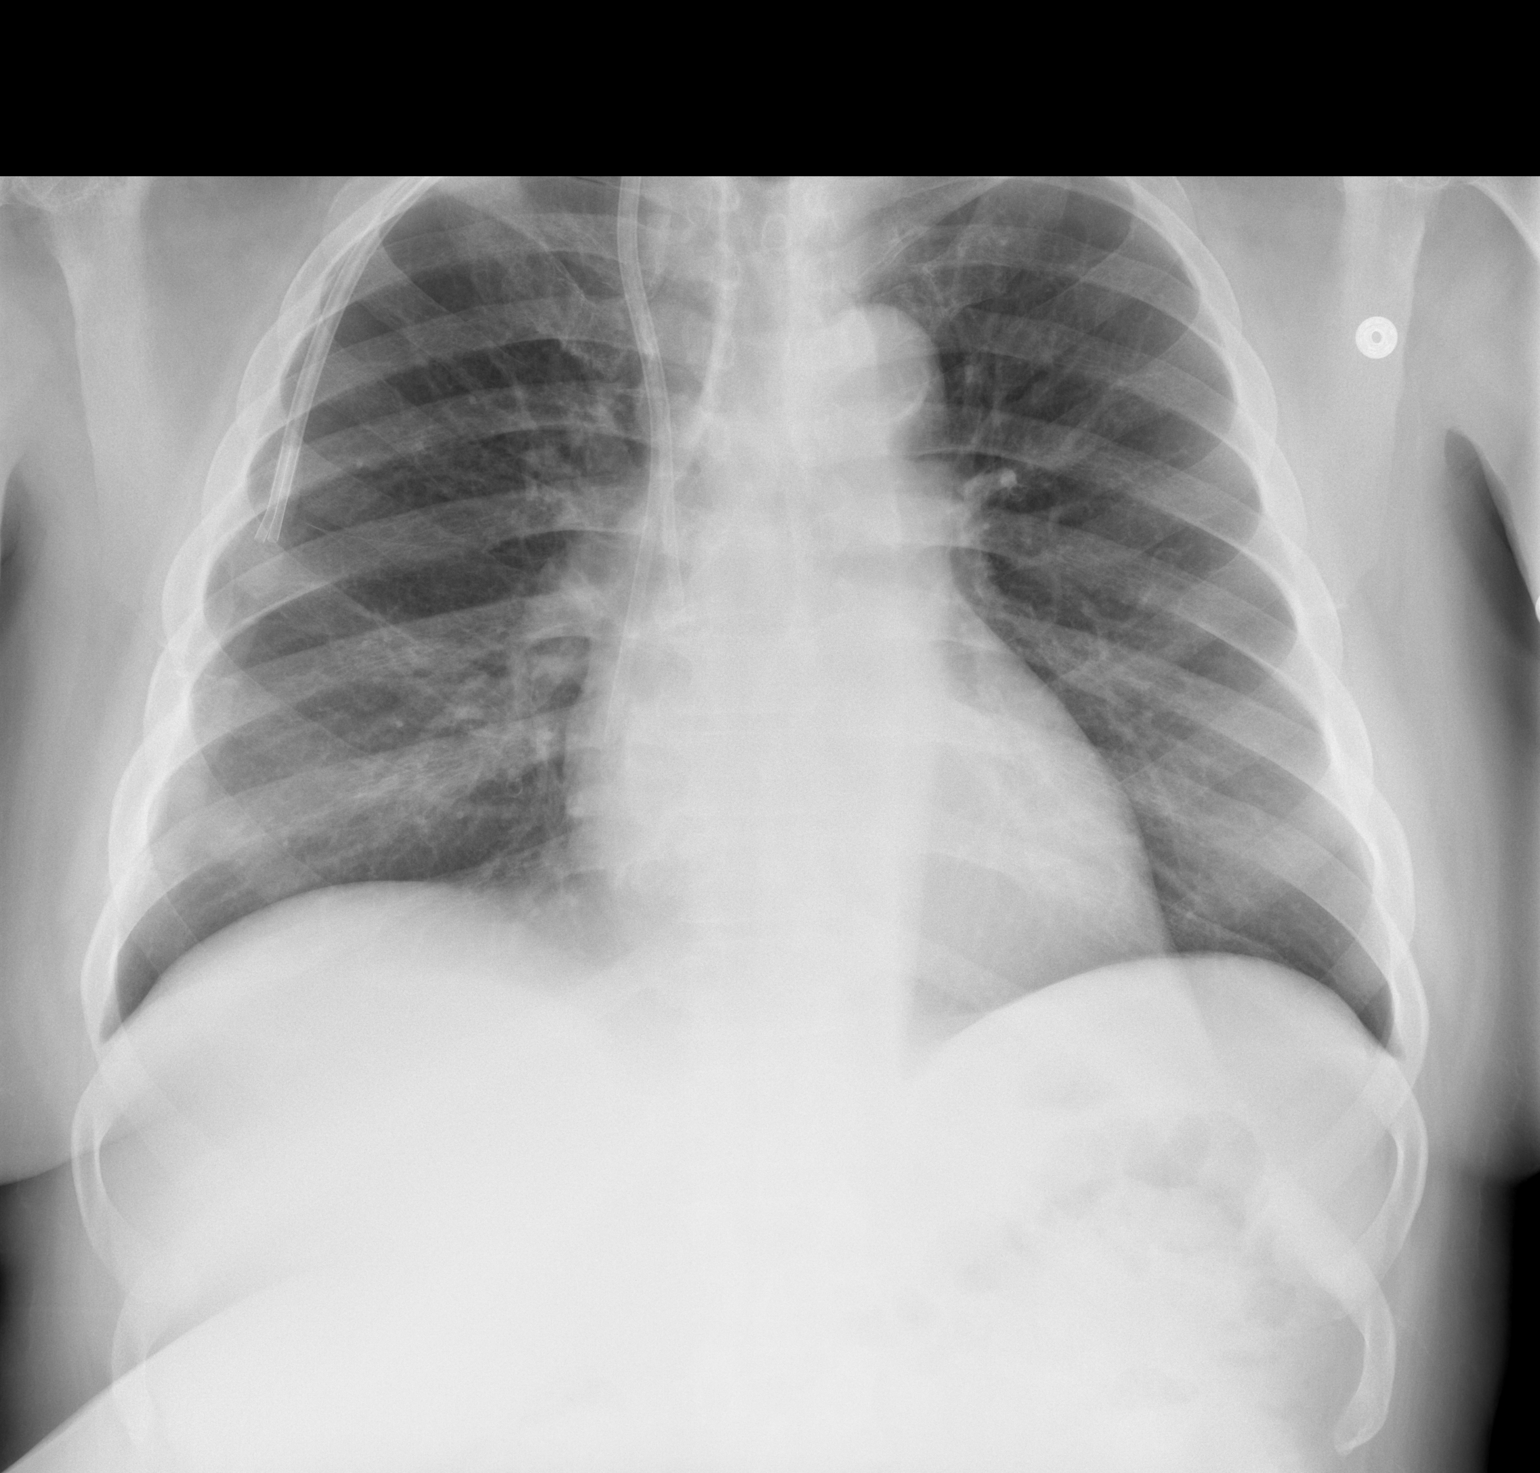

[w chest lat]
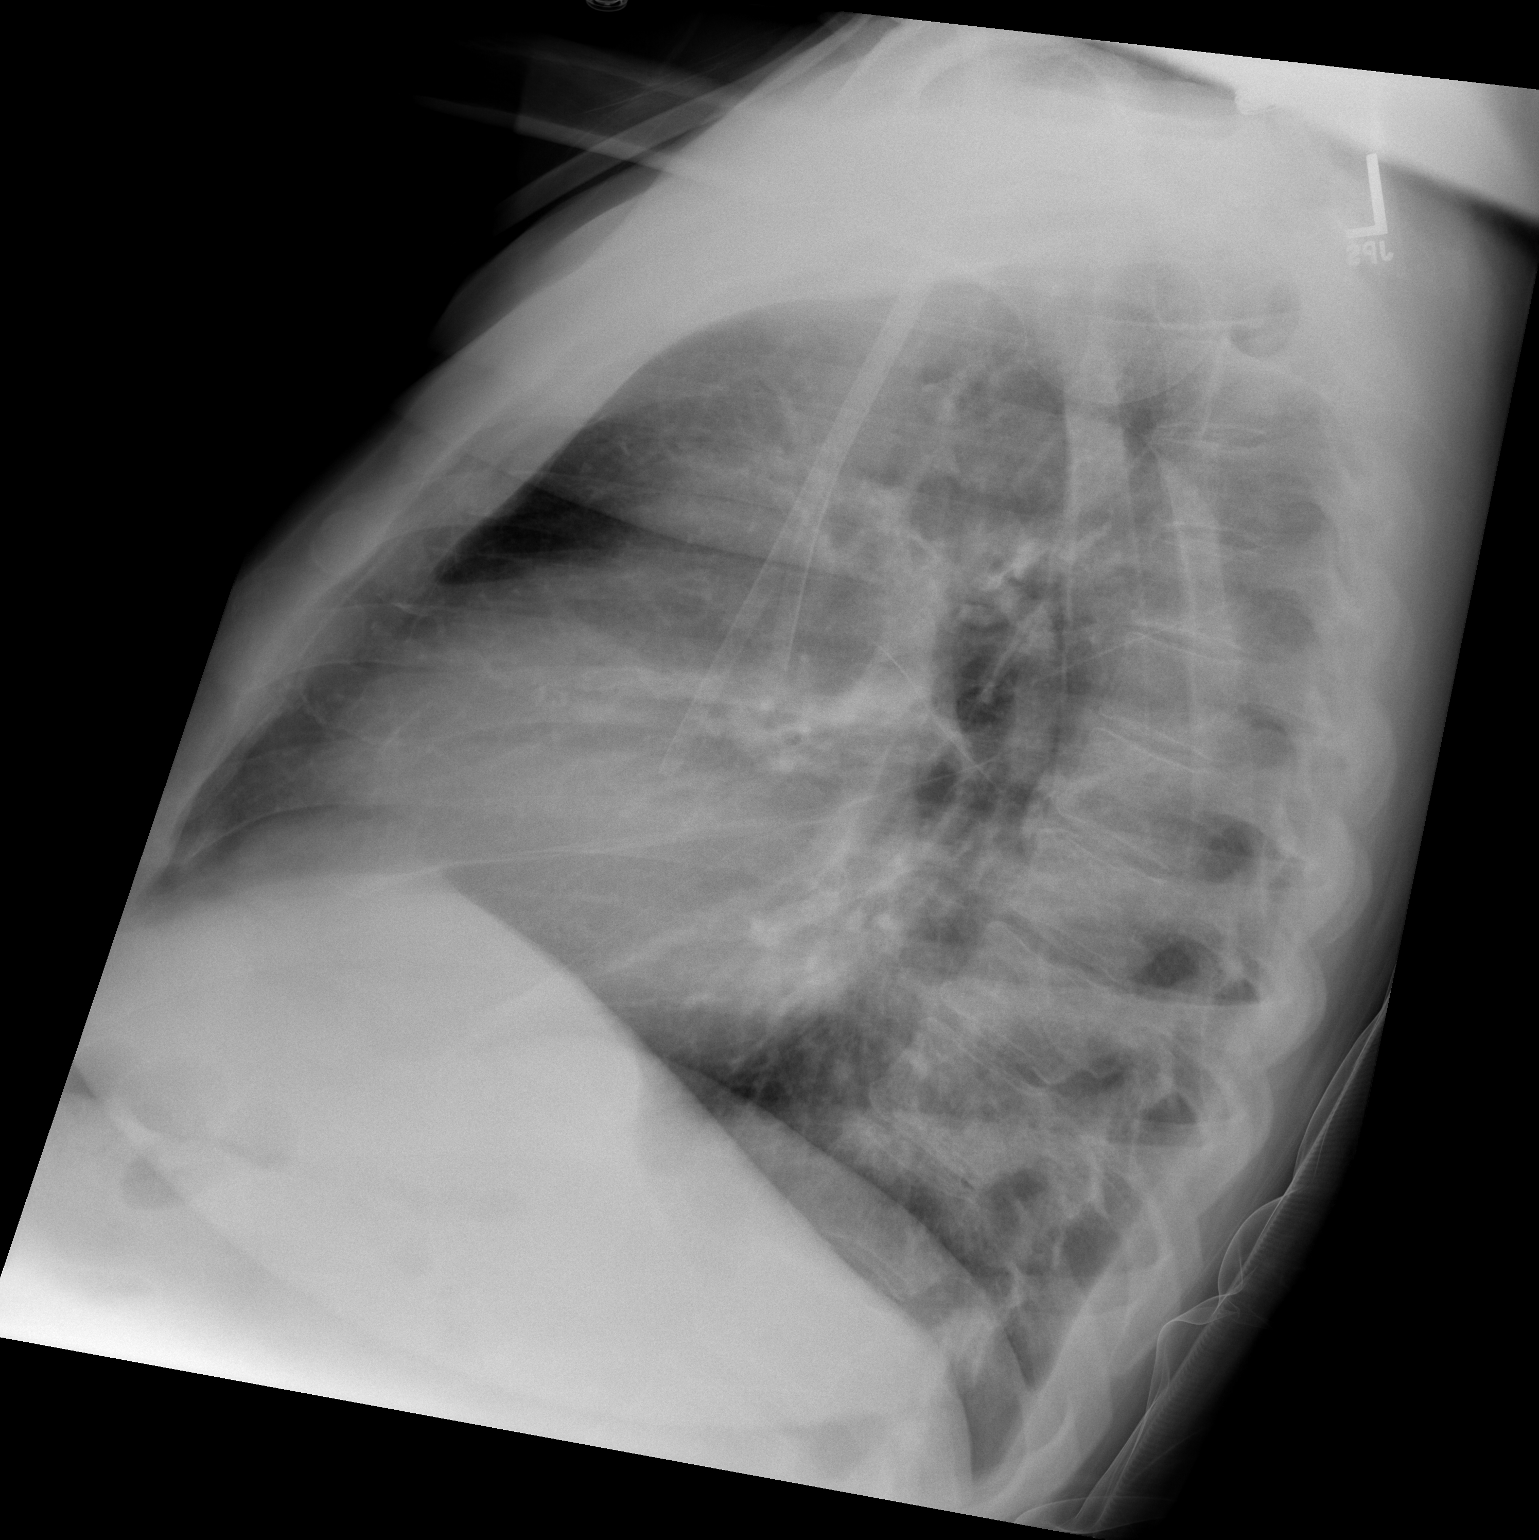

[2 of 2 positions shown; findings below may reference images not displayed]

FINDINGS: A dual lumen dialysis catheter is in place, stable in
position.  Heart and mediastinal contours are stable with
calcification seen in the aortic arch and mild cardiomegaly noted.

The lung fields appear clear with no signs of focal infiltrate or
congestive failure.  No pleural fluid or significant peribronchial
cuffing is identified.

Bony structures appear intact.
IMPRESSION: Stable appearance with no radiographic evidence for pneumonia seen.

## 2013-05-26 ENCOUNTER — Ambulatory Visit: Payer: BC Managed Care – PPO | Admitting: Internal Medicine

## 2013-06-03 ENCOUNTER — Other Ambulatory Visit: Payer: Self-pay | Admitting: Nurse Practitioner

## 2013-06-04 DIAGNOSIS — I871 Compression of vein: Secondary | ICD-10-CM | POA: Diagnosis not present

## 2013-06-04 DIAGNOSIS — N186 End stage renal disease: Secondary | ICD-10-CM | POA: Diagnosis not present

## 2013-06-04 DIAGNOSIS — T82898A Other specified complication of vascular prosthetic devices, implants and grafts, initial encounter: Secondary | ICD-10-CM | POA: Diagnosis not present

## 2013-06-06 ENCOUNTER — Other Ambulatory Visit: Payer: Self-pay | Admitting: Nurse Practitioner

## 2013-06-06 ENCOUNTER — Ambulatory Visit (INDEPENDENT_AMBULATORY_CARE_PROVIDER_SITE_OTHER): Payer: Medicare Other | Admitting: Internal Medicine

## 2013-06-06 ENCOUNTER — Encounter: Payer: Self-pay | Admitting: Internal Medicine

## 2013-06-06 VITALS — BP 115/79 | HR 66 | Temp 98.0°F | Ht 71.0 in | Wt 221.0 lb

## 2013-06-06 DIAGNOSIS — B1710 Acute hepatitis C without hepatic coma: Secondary | ICD-10-CM

## 2013-06-06 DIAGNOSIS — R300 Dysuria: Secondary | ICD-10-CM | POA: Diagnosis not present

## 2013-06-06 DIAGNOSIS — Z23 Encounter for immunization: Secondary | ICD-10-CM

## 2013-06-06 DIAGNOSIS — B20 Human immunodeficiency virus [HIV] disease: Secondary | ICD-10-CM

## 2013-06-06 LAB — CBC
HCT: 38.1 % — ABNORMAL LOW (ref 39.0–52.0)
Hemoglobin: 13.2 g/dL (ref 13.0–17.0)
MCHC: 34.6 g/dL (ref 30.0–36.0)
RDW: 13.7 % (ref 11.5–15.5)
WBC: 4.6 10*3/uL (ref 4.0–10.5)

## 2013-06-06 LAB — COMPREHENSIVE METABOLIC PANEL
Albumin: 4 g/dL (ref 3.5–5.2)
Alkaline Phosphatase: 72 U/L (ref 39–117)
BUN: 41 mg/dL — ABNORMAL HIGH (ref 6–23)
CO2: 31 mEq/L (ref 19–32)
Glucose, Bld: 74 mg/dL (ref 70–99)
Total Bilirubin: 0.6 mg/dL (ref 0.3–1.2)

## 2013-06-06 LAB — T-HELPER CELL (CD4) - (RCID CLINIC ONLY): CD4 % Helper T Cell: 26 % — ABNORMAL LOW (ref 33–55)

## 2013-06-06 MED ORDER — DARUNAVIR ETHANOLATE 800 MG PO TABS: 800.0000 mg | ORAL_TABLET | Freq: Every day | ORAL | Status: DC

## 2013-06-06 NOTE — Progress Notes (Signed)
[facility] NOTE  RFV: routine Subjective:    Patient ID: Bryan Gilmore, male    DOB: 04-19-1951, 62 y.o.   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 Doing well with medications. Not missing any doses. He states that he is in good state of health. He is trying to find a job but difficult with all his doctors appointments. He denies having an dysuria, or dark cloudy urine but he states that his lower abdomen feels tight since having surgery from fournier's gangrene.  Soc hx: maybe need to be moving since his new management is not responsive to renters  No Known Allergies Current Outpatient Prescriptions on File Prior to Visit  Medication Sig Dispense Refill  . allopurinol (ZYLOPRIM) 300 MG tablet Take 300 mg by mouth daily.      . calcium acetate (PHOSLO) 667 MG capsule Take 667-1,334 mg by mouth 6 (six) times daily. Take 2 capsules by mouth 3 times daily with meals and 1 capsule by mouth 3 times daily with snacks.      . gabapentin (NEURONTIN) 100 MG capsule TAKE ONE CAPSULE BY MOUTH THREE TIMES DAILY  90 capsule  1  . lamivudine (EPIVIR) 100 MG tablet Take 0.5 tablets (50 mg total) by mouth daily.  30 tablet  6  . raltegravir (ISENTRESS) 400 MG tablet Take 1 tablet (400 mg total) by mouth 2 (two) times daily.  60 tablet  11  . ritonavir (NORVIR) 100 MG TABS Take 1 tablet (100 mg total) by mouth daily.  30 tablet  11  . zidovudine (RETROVIR) 300 MG tablet Take 1 tablet (300 mg total) by mouth daily.  30 tablet  11  . b complex-vitamin c-folic acid (NEPHRO-VITE) 0.8 MG TABS Take 0.8 mg by mouth at bedtime.       No current facility-administered medications on file prior to visit.   Active Ambulatory Problems    Diagnosis Date Noted  . HIV INFECTION 09/15/2010  . HEPATITIS C 10/07/2010  . GOUT, CHRONIC 10/07/2010  .  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 Normocytic anemia 02/17/2012  . ESRD (end stage renal disease) 02/29/2012  . Anemia of chronic renal failure 02/29/2012  . Hyperkalemia 03/29/2012  . Secondary hyperparathyroidism (of renal origin) 03/29/2012  . Fever 04/01/2012  . Delirium 04/01/2012  . Atrial fibrillation, permanent 04/04/2012  . Alcohol abuse   . Metabolic encephalopathy 04/06/2012  . Fournier's gangrene in male 06/05/2012  . End stage renal disease 10/30/2012  . Aftercare following surgery of the circulatory system, NEC 12/18/2012  . Colostomy status 12/26/2012  . Other complications due to renal dialysis device, implant, and graft 01/29/2013   Resolved Ambulatory Problems    Diagnosis Date Noted  . Decreased libido 10/07/2010  . Sepsis secondary to suspected Fornier's gangrene 02/17/2012  . ARF (acute renal failure) in the setting of stage III CKD 02/17/2012  . Metabolic acidosis 02/17/2012  . Hyperkalemia 02/17/2012  . Hyponatremia 02/17/2012  . Acute respiratory failure 02/17/2012   Past Medical History  Diagnosis Date  . HIV (human immunodeficiency virus infection)   . Gout   . Hepatitis C   . Fournier's gangrene   . Anemia   . Severe protein-calorie malnutrition   . Dysrhythmia   . Hypertension   . Sebaceous cyst   . Cough   .  Unspecified constipation   . Gout, unspecified   . GERD (gastroesophageal reflux disease)   . Atrial fibrillation   . Ulcer of lower limb, unspecified   . Ulcer of lower limb, unspecified   . Other severe protein-calorie malnutrition   . Disorders of phosphorus metabolism   . Hypopotassemia   . Anemia in chronic kidney disease(285.21)   . Anemia in chronic kidney disease(285.21)   . Anemia, unspecified   . Hepatitis, unspecified   . Altered mental status   . Hemoptysis, unspecified   . Gangrene   . Peripheral vascular disease, unspecified   . Arthritis      Review of Systems 12 point ros is negative    Objective:    Physical Exam BP 115/79  Pulse 66  Temp(Src) 98 F (36.7 C) (Oral)  Ht 5\' 11"  (1.803 m)  Wt 221 lb (100.245 kg)  BMI 30.84 kg/m2 Physical Exam  Constitutional: He is oriented to person, place, and time. He appears well-developed and well-nourished. No distress.  HENT:  Mouth/Throat: Oropharynx is clear and moist. No oropharyngeal exudate.  Cardiovascular: Normal rate, regular rhythm and normal heart sounds. Exam reveals no gallop and no friction rub.  No murmur heard.  Pulmonary/Chest: Effort normal and breath sounds normal. No respiratory distress. He has no wheezes.  Abdominal: Soft. Bowel sounds are normal. He exhibits no distension. There is no tenderness.  Lymphadenopathy:  He has no cervical adenopathy.  Neurological: He is alert and oriented to person, place, and time.  Skin: Skin is warm and dry. No rash noted. No erythema.  Psychiatric: He has a normal mood and affect. His behavior is normal.          Assessment & Plan:  HIV = continue on his current HIV regimen, will check cd 4 count and viral load today, in addn to cbc and cmp  HCV = discussed that it would be good for him to be initiated into being followed by hepatologist. Has not had recent RUQ u/s to evaluate liver. Will consider to do in early 2015. Mentioned that new therapies are available. He is former alcohol user, but no longer drinking for several years.  Health maintenance = will get 3rd double dose hep B series on wed since we are out of the vac and flu vaccination today.  ? Dysuria = will get ua and urine culture  rtc in 6 months

## 2013-06-09 LAB — HIV-1 RNA QUANT-NO REFLEX-BLD
HIV 1 RNA Quant: <20 copies/mL (ref <20)
HIV-1 RNA Quant, Log: <1.3 {Log} (ref <1.30)

## 2013-06-11 ENCOUNTER — Ambulatory Visit (INDEPENDENT_AMBULATORY_CARE_PROVIDER_SITE_OTHER): Payer: Medicare Other | Admitting: *Deleted

## 2013-06-11 ENCOUNTER — Other Ambulatory Visit: Payer: Medicare Other

## 2013-06-11 DIAGNOSIS — R300 Dysuria: Secondary | ICD-10-CM

## 2013-06-11 DIAGNOSIS — Z23 Encounter for immunization: Secondary | ICD-10-CM

## 2013-06-11 NOTE — Addendum Note (Signed)
Addended by: Mariea Clonts D on: 06/11/2013 11:09 AM   Modules accepted: Orders

## 2013-06-12 LAB — URINALYSIS, MICROSCOPIC ONLY
Bacteria, UA: NONE SEEN
Crystals: NONE SEEN
Squamous Epithelial / LPF: NONE SEEN
WBC, UA: >50 WBC/hpf — AB (ref <3)

## 2013-06-12 LAB — URINALYSIS, ROUTINE W REFLEX MICROSCOPIC
Glucose, UA: NEGATIVE mg/dL
Hgb urine dipstick: NEGATIVE
Nitrite: NEGATIVE
Protein, ur: >300 mg/dL — AB
Urobilinogen, UA: 0.2 mg/dL (ref 0.0–1.0)

## 2013-06-14 LAB — URINE CULTURE: Colony Count: >=100000

## 2013-07-05 ENCOUNTER — Emergency Department (HOSPITAL_COMMUNITY): Payer: BC Managed Care – PPO

## 2013-07-05 ENCOUNTER — Encounter (HOSPITAL_COMMUNITY): Payer: Self-pay | Admitting: Emergency Medicine

## 2013-07-05 ENCOUNTER — Emergency Department (HOSPITAL_COMMUNITY)
Admission: EM | Admit: 2013-07-05 | Discharge: 2013-07-05 | Disposition: A | Discharge status: Home / Self Care | Payer: BC Managed Care – PPO | Attending: Emergency Medicine | Admitting: Emergency Medicine

## 2013-07-05 DIAGNOSIS — R0602 Shortness of breath: Secondary | ICD-10-CM | POA: Diagnosis not present

## 2013-07-05 DIAGNOSIS — Z872 Personal history of diseases of the skin and subcutaneous tissue: Secondary | ICD-10-CM | POA: Diagnosis not present

## 2013-07-05 DIAGNOSIS — I120 Hypertensive chronic kidney disease with stage 5 chronic kidney disease or end stage renal disease: Secondary | ICD-10-CM | POA: Insufficient documentation

## 2013-07-05 DIAGNOSIS — N186 End stage renal disease: Secondary | ICD-10-CM | POA: Insufficient documentation

## 2013-07-05 DIAGNOSIS — Z862 Personal history of diseases of the blood and blood-forming organs and certain disorders involving the immune mechanism: Secondary | ICD-10-CM | POA: Insufficient documentation

## 2013-07-05 DIAGNOSIS — Z79899 Other long term (current) drug therapy: Secondary | ICD-10-CM | POA: Insufficient documentation

## 2013-07-05 DIAGNOSIS — Z8619 Personal history of other infectious and parasitic diseases: Secondary | ICD-10-CM | POA: Insufficient documentation

## 2013-07-05 DIAGNOSIS — T452X5A Adverse effect of vitamins, initial encounter: Secondary | ICD-10-CM | POA: Insufficient documentation

## 2013-07-05 DIAGNOSIS — T45515A Adverse effect of anticoagulants, initial encounter: Secondary | ICD-10-CM | POA: Insufficient documentation

## 2013-07-05 DIAGNOSIS — Z8719 Personal history of other diseases of the digestive system: Secondary | ICD-10-CM | POA: Diagnosis not present

## 2013-07-05 DIAGNOSIS — Z992 Dependence on renal dialysis: Secondary | ICD-10-CM | POA: Insufficient documentation

## 2013-07-05 DIAGNOSIS — M109 Gout, unspecified: Secondary | ICD-10-CM | POA: Insufficient documentation

## 2013-07-05 DIAGNOSIS — Z21 Asymptomatic human immunodeficiency virus [HIV] infection status: Secondary | ICD-10-CM | POA: Diagnosis not present

## 2013-07-05 DIAGNOSIS — R079 Chest pain, unspecified: Secondary | ICD-10-CM | POA: Diagnosis not present

## 2013-07-05 DIAGNOSIS — Z8709 Personal history of other diseases of the respiratory system: Secondary | ICD-10-CM | POA: Diagnosis not present

## 2013-07-05 DIAGNOSIS — R0789 Other chest pain: Secondary | ICD-10-CM | POA: Diagnosis not present

## 2013-07-05 DIAGNOSIS — T50905A Adverse effect of unspecified drugs, medicaments and biological substances, initial encounter: Secondary | ICD-10-CM

## 2013-07-05 LAB — CBC WITH DIFFERENTIAL/PLATELET
Basophils Absolute: 0 10*3/uL (ref 0.0–0.1)
Eosinophils Absolute: 0.1 10*3/uL (ref 0.0–0.7)
Eosinophils Relative: 1 % (ref 0–5)
HCT: 34.2 % — ABNORMAL LOW (ref 39.0–52.0)
Hemoglobin: 11.7 g/dL — ABNORMAL LOW (ref 13.0–17.0)
Lymphocytes Relative: 32 % (ref 12–46)
Lymphs Abs: 1.9 10*3/uL (ref 0.7–4.0)
MCH: 35.6 pg — ABNORMAL HIGH (ref 26.0–34.0)
MCV: 104 fL — ABNORMAL HIGH (ref 78.0–100.0)
Monocytes Relative: 10 % (ref 3–12)
RBC: 3.29 MIL/uL — ABNORMAL LOW (ref 4.22–5.81)
RDW: 13.2 % (ref 11.5–15.5)
WBC: 5.9 10*3/uL (ref 4.0–10.5)

## 2013-07-05 LAB — POCT I-STAT TROPONIN I: Troponin i, poc: 0.05 ng/mL (ref 0.00–0.08)

## 2013-07-05 LAB — BASIC METABOLIC PANEL
BUN: 35 mg/dL — ABNORMAL HIGH (ref 6–23)
CO2: 28 mEq/L (ref 19–32)
Calcium: 9.3 mg/dL (ref 8.4–10.5)
Chloride: 96 mEq/L (ref 96–112)
GFR calc Af Amer: 13 mL/min — ABNORMAL LOW (ref >90)
GFR calc non Af Amer: 12 mL/min — ABNORMAL LOW (ref >90)
Glucose, Bld: 89 mg/dL (ref 70–99)
Sodium: 138 mEq/L (ref 135–145)

## 2013-07-05 LAB — TROPONIN I: Troponin I: <0.3 ng/mL (ref <0.30)

## 2013-07-05 MED ORDER — TRAMADOL HCL 50 MG PO TABS: 50.0000 mg | ORAL_TABLET | Freq: Four times a day (QID) | ORAL | Status: DC | PRN

## 2013-07-05 MED ORDER — IOHEXOL 350 MG/ML SOLN
100.0000 mL | Freq: Once | INTRAVENOUS | Status: AC | PRN
  Administered 2013-07-05: 100 mL via INTRAVENOUS

## 2013-07-05 MED ORDER — MORPHINE SULFATE 4 MG/ML IJ SOLN
4.0000 mg | Freq: Once | INTRAMUSCULAR | Status: AC
  Administered 2013-07-05: 4 mg via INTRAVENOUS
  Filled 2013-07-05: qty 1
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
## 2013-07-05 NOTE — ED Notes (Signed)
Patient transported to CT 

## 2013-07-05 NOTE — ED Provider Notes (Signed)
CSN: [id number]     Arrival date & time 07/05/13  1346 History   First MD Initiated Contact with Patient 07/05/13 1349     Chief Complaint  Patient presents with  . Chest Pain   (Consider location/radiation/quality/duration/timing/severity/associated sxs/prior Treatment) HPI Patient states he been feeling fine today. He went to dialysis today, he goes to dialysis on Tuesday, Wednesday, and Saturday which is today. He states he was sleeping and was awakened when the nurse gave him an injection of heparin and vitamin D. He reports about 5 minutes later he started feeling short of breath and having a chest achiness in the center of his chest. He states his back started burning and indicates his upper back. He states this has never happened before. He states now his chest discomfort feels like an achy pain. He states he still has shortness of breath but it's improved. He denies any pain or swelling in his legs. He denies any coughing. He denies any rash or itching. He denies any nausea or vomiting. He states he has only finished 1-3/4 hours out of his usual 4 hour dialysis. Patient states his current pain is a 5/10, it was a 7/10 however it improved after EMS gave him 4 aspirin to chew. He states he's never had this pain before.  Patient denies a family history of DVT, PE, or coronary artery disease.   PCP Dr Purnell Shoemaker Nephrologist Dr Kathrene Bongo  Past Medical History  Diagnosis Date  . HIV (human immunodeficiency virus infection)   . Sepsis(995.91)   . Gout   . Hepatitis C   . Fournier's gangrene   . Anemia   . Severe protein-calorie malnutrition   . Alcohol abuse   . Dysrhythmia     AFib, not a candidate for coumadin , dr [place] signed off on pt  . Hypertension   . Colostomy status   . Sebaceous cyst   . Cough   . Unspecified constipation   . Gout, unspecified   . GERD (gastroesophageal reflux disease)   . Atrial fibrillation   . Ulcer of lower limb, unspecified   . Ulcer of lower  limb, unspecified   . Human immunodeficiency virus (HIV) disease   . Other severe protein-calorie malnutrition   . Disorders of phosphorus metabolism   . Hypopotassemia   . Anemia in chronic kidney disease(285.21)   . Anemia in chronic kidney disease(285.21)   . Anemia, unspecified   . Hepatitis, unspecified   . Altered mental status   . Hemoptysis, unspecified   . Acute respiratory failure   . Gangrene   . Sepsis(995.91)   . Peripheral vascular disease, unspecified     denies  . End stage renal disease     hemodialysis tues, thurs, sat-east fornesia  . End stage renal disease   . Arthritis     gout   Past Surgical History  Procedure Laterality Date  . Cystoscopy with urethral dilatation  02/17/2012    Procedure: CYSTOSCOPY WITH URETHRAL DILATATION;  Surgeon: Martina Sinner, MD;  Location: [facility];  Service: Urology;  Laterality: N/A;  retrograde urethragram, placement of foley catheter exam under anesthesia   . Insertion of dialysis catheter  02/20/2012    Procedure: INSERTION OF DIALYSIS CATHETER;  Surgeon: Chuck Hint, MD;  Location: [facility] OR;  Service: Vascular;  Laterality: N/A;  . Irrigation and debridement abscess  02/29/2012    Procedure: IRRIGATION AND DEBRIDEMENT ABSCESS;  Surgeon: Martina Sinner, MD;  Location: [facility] OR;  Service: Urology;  Laterality: N/A;  I&D of scrotum abcess  . Insertion of dialysis catheter  03/29/2012    Procedure: INSERTION OF DIALYSIS CATHETER;  Surgeon: Larina Earthly, MD;  Location: [facility] OR;  Service: Vascular;  Laterality: N/A;  Insertion tunneled dialysis catheter right IJ  . Av fistula placement  04/12/2012    Procedure: INSERTION OF ARTERIOVENOUS (AV) GORE-TEX GRAFT ARM;  Surgeon: Larina Earthly, MD;  Location: [facility] OR;  Service: Vascular;  Laterality: Left;  insertion of 6x50 stretch goretex graft   . Thrombectomy and revision of arterioventous (av) goretex  graft  07/15/2012    Procedure: THROMBECTOMY AND REVISION OF ARTERIOVENTOUS (AV)  GORETEX  GRAFT;  Surgeon: Sherren Kerns, MD;  Location: [facility] OR;  Service: Vascular;  Laterality: Left;  . Urethrogram  07/30/2012    Procedure: URETHROGRAM;  Surgeon: Martina Sinner, MD;  Location: [facility];  Service: Urology;  Laterality: N/A;  cystogram  . Cystoscopy  07/30/2012    Procedure: CYSTOSCOPY;  Surgeon: Martina Sinner, MD;  Location: [facility];  Service: Urology;  Laterality: Bilateral;  retrograde  . Insertion of dialysis catheter  08/04/2012    Procedure: INSERTION OF DIALYSIS CATHETER;  Surgeon: Chuck Hint, MD;  Location: [facility] OR;  Service: Vascular;  Laterality: Right;  right intrajugular hemodialysis catheter insertion  . Av fistula placement  08/14/2012    Procedure: INSERTION OF ARTERIOVENOUS (AV) GORE-TEX GRAFT ARM;  Surgeon: Pryor Ochoa, MD;  Location: [facility] OR;  Service: Vascular;  Laterality: Left;  . Colostomy  02/2012    d/t an infection  . Av fistula placement Right 11/08/2012    Procedure: ARTERIOVENOUS (AV) FISTULA CREATION;  Surgeon: Chuck Hint, MD;  Location: [facility] OR;  Service: Vascular;  Laterality: Right;  . Hernia repair      Umbilical  . Surgery for fournier's gangrene  02/2012  . Colon surgery  13    colostomy  . Colostomy closure  02/21/2013  . Colostomy closure N/A 02/21/2013    Procedure: COLOSTOMY CLOSURE;  Surgeon: Mariella Saa, MD;  Location: [facility] OR;  Service: General;  Laterality: N/A;  takedown of sigmoid colostomy with anastomosis  . Bowel resection N/A 02/21/2013    Procedure: SMALL BOWEL RESECTION;  Surgeon: Mariella Saa, MD;  Location: [facility] OR;  Service: General;  Laterality: N/A;   Family History  Problem Relation Age of Onset  . Hypertension Mother   . Hypertension Father   . Cancer Cousin     breast   History  Substance Use Topics  . Smoking status: Never Smoker   . Smokeless tobacco: Never Used     Comment: quit alcohol 1 yr  . Alcohol Use: No     Comment: 9 months  lives at home Lives  alone  Review of Systems  All other systems reviewed and are negative.    Allergies  Review of patient's allergies indicates no known allergies.  Home Medications   Current Outpatient Rx  Name  Route  Sig  Dispense  Refill  . allopurinol (ZYLOPRIM) 300 MG tablet   Oral   Take 300 mg by mouth daily.         . calcium acetate (PHOSLO) 667 MG capsule   Oral   Take 667-1,334 mg by mouth 6 (six) times daily. Take 2 capsules by mouth 3 times daily with meals and 1 capsule by mouth 3 times daily with snacks.         . Darunavir Ethanolate (PREZISTA) 800 MG  tablet   Oral   Take 1 tablet (800 mg total) by mouth daily with breakfast.   30 tablet   11   . gabapentin (NEURONTIN) 100 MG capsule   Oral   Take 100 mg by mouth 3 (three) times daily.         Marland Kitchen lamivudine (EPIVIR) 100 MG tablet   Oral   Take 0.5 tablets (50 mg total) by mouth daily.   30 tablet   6   . raltegravir (ISENTRESS) 400 MG tablet   Oral   Take 1 tablet (400 mg total) by mouth 2 (two) times daily.   60 tablet   11   . ritonavir (NORVIR) 100 MG TABS   Oral   Take 1 tablet (100 mg total) by mouth daily.   30 tablet   11   . zidovudine (RETROVIR) 300 MG tablet   Oral   Take 1 tablet (300 mg total) by mouth daily.   30 tablet   11    BP 134/94  Pulse 79  Temp(Src) 98.8 F (37.1 C) (Oral)  SpO2 99%  Vital signs normal   Physical Exam  Nursing note and vitals reviewed. Constitutional: He is oriented to person, place, and time. He appears well-developed and well-nourished.  Non-toxic appearance. He does not appear ill. No distress.  HENT:  Head: Normocephalic and atraumatic.  Right Ear: External ear normal.  Left Ear: External ear normal.  Nose: Nose normal. No mucosal edema or rhinorrhea.  Mouth/Throat: Oropharynx is clear and moist and mucous membranes are normal. No dental abscesses or uvula swelling.  Eyes: Conjunctivae and EOM are normal. Pupils are equal, round, and reactive to  light.  Neck: Normal range of motion and full passive range of motion without pain. Neck supple.  Cardiovascular: Normal rate, regular rhythm and normal heart sounds.  Exam reveals no gallop and no friction rub.   No murmur heard. Pulmonary/Chest: Effort normal and breath sounds normal. No respiratory distress. He has no wheezes. He has no rhonchi. He has no rales. He exhibits no tenderness and no crepitus.  Abdominal: Soft. Normal appearance and bowel sounds are normal. He exhibits no distension. There is no tenderness. There is no rebound and no guarding.  Musculoskeletal: Normal range of motion. He exhibits no edema and no tenderness.  Moves all extremities well. No edema. Pt indicates he had burning in his upper back/post chest diffusely Dialysis graft in left arm  Neurological: He is alert and oriented to person, place, and time. He has normal strength. No cranial nerve deficit.  Skin: Skin is warm, dry and intact. No rash noted. No erythema. No pallor.  Psychiatric: He has a normal mood and affect. His speech is normal and behavior is normal. His mood appears not anxious.    ED Course  Procedures (including critical care time)  Medications  morphine 4 MG/ML injection 4 mg (4 mg Intravenous Given 07/05/13 1444)  iohexol (OMNIPAQUE) 350 MG/ML injection 100 mL (100 mLs Intravenous Contrast Given 07/05/13 1543)    Pt given his test results, we discussed a possible side effect from the IV meds given just prior to his symptom onset. He should also consider getting a stress test done to further evaluate his chest pain. 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 He does not have acute dissection, pneumonia, PE, or cardiac ischemia. He is stable for discharge  Labs Review Results for orders placed during the hospital encounter of  07/05/13  CBC WITH DIFFERENTIAL      Result Value Range   WBC 5.9  4.0 - 10.5 K/uL   RBC 3.29 (*) 4.22 - 5.81 MIL/uL    Hemoglobin 11.7 (*) 13.0 - 17.0 g/dL   HCT 84.6 (*) 96.2 - 95.2 %   MCV 104.0 (*) 78.0 - 100.0 fL   MCH 35.6 (*) 26.0 - 34.0 pg   MCHC 34.2  30.0 - 36.0 g/dL   RDW 84.1  32.4 - 40.1 %   Platelets 189  150 - 400 K/uL   Neutrophils Relative % 58  43 - 77 %   Neutro Abs 3.4  1.7 - 7.7 K/uL   Lymphocytes Relative 32  12 - 46 %   Lymphs Abs 1.9  0.7 - 4.0 K/uL   Monocytes Relative 10  3 - 12 %   Monocytes Absolute 0.6  0.1 - 1.0 K/uL   Eosinophils Relative 1  0 - 5 %   Eosinophils Absolute 0.1  0.0 - 0.7 K/uL   Basophils Relative 0  0 - 1 %   Basophils Absolute 0.0  0.0 - 0.1 K/uL  BASIC METABOLIC PANEL      Result Value Range   Sodium 138  135 - 145 mEq/L   Potassium 3.3 (*) 3.5 - 5.1 mEq/L   Chloride 96  96 - 112 mEq/L   CO2 28  19 - 32 mEq/L   Glucose, Bld 89  70 - 99 mg/dL   BUN 35 (*) 6 - 23 mg/dL   Creatinine, Ser 0.27 (*) 0.50 - 1.35 mg/dL   Calcium 9.3  8.4 - 25.3 mg/dL   GFR calc non Af Amer 12 (*) >90 mL/min   GFR calc Af Amer 13 (*) >90 mL/min  TROPONIN I      Result Value Range   Troponin I <0.30  <0.30 ng/mL  POCT I-STAT TROPONIN I      Result Value Range   Troponin i, poc 0.05  0.00 - 0.08 ng/mL   Comment 3            Laboratory interpretation all normal except mild anemia     Imaging Review Ct Angio Chest W/cm &/or Wo Cm  07/05/2013   CLINICAL DATA:  Shortness of breath, chest pain.  EXAM: CT ANGIOGRAPHY CHEST WITH CONTRAST  TECHNIQUE: Multidetector CT imaging of the chest was performed using the standard protocol during bolus administration of intravenous contrast. Multiplanar CT image reconstructions including MIPs were obtained to evaluate the vascular anatomy.  CONTRAST:  OMNIPAQUE IOHEXOL 350 MG/ML SOLN  COMPARISON:  None.  FINDINGS: No pleural effusion or pneumothorax is noted. No significant pulmonary parenchymal abnormality is noted. Coronary artery calcifications are noted consistent with coronary artery disease. There is no evidence of pulmonary  embolus. Atherosclerotic calcifications of abdominal aorta are noted. The ascending aorta is mildly dilated measuring 4.3 cm. No mediastinal mass or adenopathy is noted.  Review of the MIP images confirms the above findings.  IMPRESSION: No evidence of pulmonary embolus. Mild dilatation of ascending aorta is noted which measures 4.3 cm in diameter. Coronary artery calcifications are noted suggesting coronary artery disease.   Electronically Signed   By: Roque Lias M.D.   On: 07/05/2013 16:07    EKG Interpretation     Ventricular Rate:  74 PR Interval:    QRS Duration: 109 QT Interval:  513 QTC Calculation: 569 R Axis:   77 Text Interpretation:  Atrial fibrillation Ventricular premature complex Minimal  ST depression, diffuse leads Prolonged QT interval Baseline wander in lead(s) V1 No significant change since last tracing            MDM   1. Medication reaction, initial encounter    New Prescriptions   TRAMADOL (ULTRAM) 50 MG TABLET    Take 1 tablet (50 mg total) by mouth every 6 (six) hours as needed for pain.   Plan discharge  Devoria Albe, MD, Franz Dell, MD 07/05/13 [id number]

## 2013-07-05 NOTE — ED Notes (Signed)
Patient returned from CT

## 2013-08-08 ENCOUNTER — Other Ambulatory Visit: Payer: Self-pay | Admitting: Nurse Practitioner

## 2013-08-11 DIAGNOSIS — N390 Urinary tract infection, site not specified: Secondary | ICD-10-CM | POA: Diagnosis not present

## 2013-08-11 DIAGNOSIS — R310 Gross hematuria: Secondary | ICD-10-CM | POA: Diagnosis not present

## 2013-08-11 DIAGNOSIS — N35919 Unspecified urethral stricture, male, unspecified site: Secondary | ICD-10-CM | POA: Diagnosis not present

## 2013-08-13 ENCOUNTER — Telehealth (INDEPENDENT_AMBULATORY_CARE_PROVIDER_SITE_OTHER): Payer: Self-pay | Admitting: *Deleted

## 2013-08-13 NOTE — Telephone Encounter (Signed)
Patient called to report that he has been having a noticeable amount of weight gain over the last 2-3 months.  Patient reports normal BMs daily.  Patient states every time he goes to dialysis and weighs himself it just keeps increasing.  Patient concerned something medically could be going on to cause this.  Explained that I would send a message to Dr. Johna Sheriff to ask his opinion and what the best plan for the patient would be then we will give him a call back.

## 2013-08-14 NOTE — Telephone Encounter (Signed)
Called patient to make aware that per Dr. Johna Sheriff patient need's to discuss his weight gain concerns with his physician or at Dialysis.  Patient verbalized understanding.

## 2013-08-20 DIAGNOSIS — N35919 Unspecified urethral stricture, male, unspecified site: Secondary | ICD-10-CM | POA: Diagnosis not present

## 2013-08-27 ENCOUNTER — Ambulatory Visit (INDEPENDENT_AMBULATORY_CARE_PROVIDER_SITE_OTHER): Payer: BC Managed Care – PPO | Admitting: Internal Medicine

## 2013-08-27 ENCOUNTER — Encounter: Payer: Self-pay | Admitting: Internal Medicine

## 2013-08-27 ENCOUNTER — Ambulatory Visit (INDEPENDENT_AMBULATORY_CARE_PROVIDER_SITE_OTHER): Payer: Medicare Other | Admitting: Nurse Practitioner

## 2013-08-27 ENCOUNTER — Telehealth: Payer: Self-pay | Admitting: *Deleted

## 2013-08-27 ENCOUNTER — Encounter: Payer: Self-pay | Admitting: Nurse Practitioner

## 2013-08-27 VITALS — BP 118/72 | HR 77 | Temp 97.4°F | Wt 233.0 lb

## 2013-08-27 VITALS — Wt 228.0 lb

## 2013-08-27 DIAGNOSIS — B1920 Unspecified viral hepatitis C without hepatic coma: Secondary | ICD-10-CM | POA: Diagnosis not present

## 2013-08-27 DIAGNOSIS — N62 Hypertrophy of breast: Secondary | ICD-10-CM

## 2013-08-27 DIAGNOSIS — B20 Human immunodeficiency virus [HIV] disease: Secondary | ICD-10-CM | POA: Diagnosis not present

## 2013-08-27 DIAGNOSIS — G629 Polyneuropathy, unspecified: Secondary | ICD-10-CM

## 2013-08-27 DIAGNOSIS — N186 End stage renal disease: Secondary | ICD-10-CM

## 2013-08-27 DIAGNOSIS — G589 Mononeuropathy, unspecified: Secondary | ICD-10-CM | POA: Diagnosis not present

## 2013-08-27 LAB — COMPREHENSIVE METABOLIC PANEL
ALT: 24 U/L (ref 0–53)
Albumin: 3.7 g/dL (ref 3.5–5.2)
Alkaline Phosphatase: 55 U/L (ref 39–117)
CO2: 26 mEq/L (ref 19–32)
Creat: 7.81 mg/dL — ABNORMAL HIGH (ref 0.50–1.35)
Glucose, Bld: 91 mg/dL (ref 70–99)
Potassium: 4.4 mEq/L (ref 3.5–5.3)
Sodium: 138 mEq/L (ref 135–145)
Total Bilirubin: 0.5 mg/dL (ref 0.3–1.2)
Total Protein: 7.5 g/dL (ref 6.0–8.3)

## 2013-08-27 LAB — PROTIME-INR: INR: 0.96 (ref <1.50)

## 2013-08-27 LAB — T4, FREE: Free T4: 0.82 ng/dL (ref 0.80–1.80)

## 2013-08-27 LAB — TSH: TSH: 5.089 u[IU]/mL — ABNORMAL HIGH (ref 0.350–4.500)

## 2013-08-27 MED ORDER — TETANUS-DIPHTH-ACELL PERTUSSIS 5-2.5-18.5 LF-MCG/0.5 IM SUSP: 0.5000 mL | Freq: Once | INTRAMUSCULAR | Status: DC

## 2013-08-27 MED ORDER — GABAPENTIN 100 MG PO CAPS: 100.0000 mg | ORAL_CAPSULE | Freq: Three times a day (TID) | ORAL | Status: DC

## 2013-08-27 NOTE — Progress Notes (Signed)
Subjective:    Patient ID: Bryan Gilmore, male    DOB: 1950/09/12, 62 y.o.   MRN: [id number]  [facility] M with HIV-HCV & ESRD on HD, CD 4 count of 480/VL<20, on RLG/DRVr/epivir/retrovir. Doing well with exception to noticing increasing bilateral breast tenderness and growth. In addition, having increasing abdominal girth x 3-4 months. They continue to increase his dry weight in dialysis. He recently underwent imaging last week of abdomen.  Current Outpatient Prescriptions on File Prior to Visit  Medication Sig Dispense Refill  . allopurinol (ZYLOPRIM) 300 MG tablet TAKE ONE TABLET BY MOUTH ONCE DAILY FOR GOUT  30 tablet  0  . calcium acetate (PHOSLO) 667 MG capsule Take 667-1,334 mg by mouth 6 (six) times daily. Take 2 capsules by mouth 3 times daily with meals and 1 capsule by mouth 3 times daily with snacks.      . Darunavir Ethanolate (PREZISTA) 800 MG tablet Take 1 tablet (800 mg total) by mouth daily with breakfast.  30 tablet  11  . gabapentin (NEURONTIN) 100 MG capsule Take 1 capsule (100 mg total) by mouth 3 (three) times daily.  90 capsule  5  . lamivudine (EPIVIR) 100 MG tablet Take 0.5 tablets (50 mg total) by mouth daily.  30 tablet  6  . raltegravir (ISENTRESS) 400 MG tablet Take 1 tablet (400 mg total) by mouth 2 (two) times daily.  60 tablet  11  . ritonavir (NORVIR) 100 MG TABS Take 1 tablet (100 mg total) by mouth daily.  30 tablet  11  . Tdap (BOOSTRIX) 5-2.5-18.5 LF-MCG/0.5 injection Inject 0.5 mLs into the muscle once.  0.5 mL  0  . zidovudine (RETROVIR) 300 MG tablet Take 1 tablet (300 mg total) by mouth daily.  30 tablet  11   No current facility-administered medications on file prior to visit.   Active Ambulatory Problems    Diagnosis Date Noted  . HIV INFECTION 09/15/2010  . HEPATITIS C 10/07/2010  . GOUT, CHRONIC 10/07/2010  . 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 Normocytic anemia 02/17/2012  . ESRD (end stage renal disease) 02/29/2012  . Anemia of chronic  renal failure 02/29/2012  . Hyperkalemia 03/29/2012  . Secondary hyperparathyroidism (of renal origin) 03/29/2012  . Fever 04/01/2012  . Delirium 04/01/2012  . Atrial fibrillation, permanent 04/04/2012  . Alcohol abuse   . Metabolic encephalopathy 04/06/2012  . Fournier's gangrene in male 06/05/2012  . End stage renal disease 10/30/2012  . Aftercare following surgery of the circulatory system, NEC 12/18/2012  . Colostomy status 12/26/2012  . Other complications due to renal dialysis device, implant, and graft 01/29/2013   Resolved Ambulatory Problems    Diagnosis Date Noted  . Decreased libido 10/07/2010  . Sepsis secondary to suspected Fornier's gangrene 02/17/2012  . ARF (acute renal failure) in the setting of stage III CKD 02/17/2012  . Metabolic acidosis 02/17/2012  . Hyperkalemia 02/17/2012  . Hyponatremia 02/17/2012  . Acute respiratory failure 02/17/2012   Past Medical History  Diagnosis Date  . HIV (human immunodeficiency virus infection)   . Gout   . Hepatitis C   . Fournier's gangrene   . Anemia   . Severe protein-calorie malnutrition   . Dysrhythmia   . Hypertension   . Sebaceous cyst   . Cough   . Unspecified constipation   . Gout, unspecified   . GERD (gastroesophageal reflux disease)   . Atrial fibrillation   . Ulcer of lower limb, unspecified   . Ulcer of lower limb,  unspecified   . Other severe protein-calorie malnutrition   . Disorders of phosphorus metabolism   . Hypopotassemia   . Anemia in chronic kidney disease(285.21)   . Anemia in chronic kidney disease(285.21)   . Anemia, unspecified   . Hepatitis, unspecified   . Altered mental status   . Hemoptysis, unspecified   . Gangrene   . Peripheral vascular disease, unspecified   . Arthritis        Review of Systems  Constitutional: Negative for fever, chills, diaphoresis, activity change, appetite change, fatigue and unexpected weight change.  HENT: Negative for congestion, sore throat,  rhinorrhea, sneezing, trouble swallowing and sinus pressure.  Eyes: Negative for photophobia and visual disturbance.  Chest: increased breast size and breast tenderness Respiratory: Negative for cough, chest tightness, shortness of breath, wheezing and stridor.  Cardiovascular: Negative for chest pain, palpitations and leg swelling.  Gastrointestinal:increase abdominal distention Negative for nausea, vomiting, abdominal pain, diarrhea, constipation, blood in stool, abdominal distention and anal bleeding.  Genitourinary: Negative for dysuria, hematuria, flank pain and difficulty urinating.  Musculoskeletal: Negative for myalgias, back pain, joint swelling, arthralgias and gait problem.  Skin: Negative for color change, pallor, rash and wound.  Neurological: Negative for dizziness, tremors, weakness and light-headedness.  Hematological: Negative for adenopathy. Does not bruise/bleed easily.  Psychiatric/Behavioral: Negative for behavioral problems, confusion, sleep disturbance, dysphoric mood, decreased concentration and agitation.       Objective:   Physical Exam Wt 228 lb (103.42 kg) Physical Exam  Constitutional: He is oriented to person, place, and time. He appears well-developed and well-nourished. No distress.  HENT:  Mouth/Throat: Oropharynx is clear and moist. No oropharyngeal exudate.  Cardiovascular: Normal rate, regular rhythm and normal heart sounds. Exam reveals no gallop and no friction rub.  No murmur heard.  Chest: bilateral gynecomastia with mild tenderness on palpation Pulmonary/Chest: Effort normal and breath sounds normal. No respiratory distress. He has no wheezes.  Abdominal: Soft. Bowel sounds are normal. +abdominal girth. There is no tenderness. Negative fluid wave. Keloid scarring for previous surgery Lymphadenopathy:  He has no cervical adenopathy.  Neurological: He is alert and oriented to person, place, and time.  Ext: bilateral thrills from avf in arms Skin:  Skin is warm and dry. No rash noted. No erythema.  Psychiatric: He has a normal mood and affect. His behavior is normal.          Assessment & Plan:  hiv = continue with meds. Well controlled.  hcv = geno 1b. vl 7.7M. His exam not appreciably consistent with ascites. Patient reports undegoing mri of liver last week, ordered by urology, will track down records. If he has ascites, will start on lasix 40/spironolactone 100.concern that the spironolactone worsens gynecomastia. Will check cmp, pt/ptt. We will get uro note to see if we are doing concominant work-up.  Painful gynecomastia = will check labs suc has tsh, t4, hcg, estradiol, testosterone, prolactin  Ref to hepatology  rtc in 6-8 wks

## 2013-08-27 NOTE — Patient Instructions (Signed)
Discuss HAART therapy and breast tenderness with ID doctor Follow up in 6 weeks

## 2013-08-27 NOTE — Telephone Encounter (Signed)
Left message for MD with "alert" creatinine value, 7.81.

## 2013-08-27 NOTE — Progress Notes (Signed)
Patient ID: Bryan Gilmore, male   DOB: 06-Dec-1950, 62 y.o.   MRN: [id number]    No Known Allergies  Chief Complaint  Patient presents with  . Medical Managment of Chronic Issues    5 month follow-up   . Edema    Patient c/o stomach swelling x 6-7 months   . Nipple Problem    Nipple soreness x 4 month   . Raised area's    Sores in the back of head off/on     HPI:  Patient is a 62 y.o. male seen in the office today for routine follow up Still following up ID; compliant with medication Still going to HD: T, T, sat  Nipples are sore and have been this way for several months also with enlarged breast  Weight gain; Eating good- whatever he wants, no exercise -otherwise without complaints and doing well  Review of Systems:  Review of Systems  Constitutional: Negative for malaise/fatigue.  Respiratory: Negative for cough and shortness of breath.   Cardiovascular: Negative for chest pain, palpitations and leg swelling.  Gastrointestinal: Negative for heartburn, nausea, vomiting, abdominal pain, diarrhea and constipation.  Genitourinary:       Min urine on HD  Musculoskeletal: Negative for myalgias.  Neurological: Negative for dizziness, weakness and headaches.  Psychiatric/Behavioral: Negative for depression. The patient is not nervous/anxious and does not have insomnia.      Past Medical History  Diagnosis Date  . HIV (human immunodeficiency virus infection)   . Sepsis(995.91)   . Gout   . Hepatitis C   . Fournier's gangrene   . Anemia   . Severe protein-calorie malnutrition   . Alcohol abuse   . Dysrhythmia     AFib, not a candidate for coumadin , dr [place] signed off on pt  . Hypertension   . Colostomy status   . Sebaceous cyst   . Cough   . Unspecified constipation   . Gout, unspecified   . GERD (gastroesophageal reflux disease)   . Atrial fibrillation   . Ulcer of lower limb, unspecified   . Ulcer of lower limb, unspecified   . Human immunodeficiency virus (HIV)  disease   . Other severe protein-calorie malnutrition   . Disorders of phosphorus metabolism   . Hypopotassemia   . Anemia in chronic kidney disease(285.21)   . Anemia in chronic kidney disease(285.21)   . Anemia, unspecified   . Hepatitis, unspecified   . Altered mental status   . Hemoptysis, unspecified   . Acute respiratory failure   . Gangrene   . Sepsis(995.91)   . Peripheral vascular disease, unspecified     denies  . End stage renal disease     hemodialysis tues, thurs, sat-east fornesia  . End stage renal disease   . Arthritis     gout   Past Surgical History  Procedure Laterality Date  . Cystoscopy with urethral dilatation  02/17/2012    Procedure: CYSTOSCOPY WITH URETHRAL DILATATION;  Surgeon: Martina Sinner, MD;  Location: [facility];  Service: Urology;  Laterality: N/A;  retrograde urethragram, placement of foley catheter exam under anesthesia   . Insertion of dialysis catheter  02/20/2012    Procedure: INSERTION OF DIALYSIS CATHETER;  Surgeon: Chuck Hint, MD;  Location: [facility] OR;  Service: Vascular;  Laterality: N/A;  . Irrigation and debridement abscess  02/29/2012    Procedure: IRRIGATION AND DEBRIDEMENT ABSCESS;  Surgeon: Martina Sinner, MD;  Location: [facility] OR;  Service: Urology;  Laterality: N/A;  I&D  of scrotum abcess  . Insertion of dialysis catheter  03/29/2012    Procedure: INSERTION OF DIALYSIS CATHETER;  Surgeon: Larina Earthly, MD;  Location: [facility] OR;  Service: Vascular;  Laterality: N/A;  Insertion tunneled dialysis catheter right IJ  . Av fistula placement  04/12/2012    Procedure: INSERTION OF ARTERIOVENOUS (AV) GORE-TEX GRAFT ARM;  Surgeon: Larina Earthly, MD;  Location: [facility] OR;  Service: Vascular;  Laterality: Left;  insertion of 6x50 stretch goretex graft   . Thrombectomy and revision of arterioventous (av) goretex  graft  07/15/2012    Procedure: THROMBECTOMY AND REVISION OF ARTERIOVENTOUS (AV) GORETEX  GRAFT;  Surgeon: Sherren Kerns, MD;  Location: [facility]  OR;  Service: Vascular;  Laterality: Left;  . Urethrogram  07/30/2012    Procedure: URETHROGRAM;  Surgeon: Martina Sinner, MD;  [facility]: [facility];  Service: Urology;  Laterality: N/A;  cystogram  . Cystoscopy  07/30/2012    Procedure: CYSTOSCOPY;  Surgeon: Martina Sinner, MD;  Location: [facility];  Service: Urology;  Laterality: Bilateral;  retrograde  . Insertion of dialysis catheter  08/04/2012    Procedure: INSERTION OF DIALYSIS CATHETER;  Surgeon: Chuck Hint, MD;  Location: [facility] OR;  Service: Vascular;  Laterality: Right;  right intrajugular hemodialysis catheter insertion  . Av fistula placement  08/14/2012    Procedure: INSERTION OF ARTERIOVENOUS (AV) GORE-TEX GRAFT ARM;  Surgeon: Pryor Ochoa, MD;  Location: [facility] OR;  Service: Vascular;  Laterality: Left;  . Colostomy  02/2012    d/t an infection  . Av fistula placement Right 11/08/2012    Procedure: ARTERIOVENOUS (AV) FISTULA CREATION;  Surgeon: Chuck Hint, MD;  Location: [facility] OR;  Service: Vascular;  Laterality: Right;  . Hernia repair      Umbilical  . Surgery for fournier's gangrene  02/2012  . Colon surgery  13    colostomy  . Colostomy closure  02/21/2013  . Colostomy closure N/A 02/21/2013    Procedure: COLOSTOMY CLOSURE;  Surgeon: Mariella Saa, MD;  Location: [facility] OR;  Service: General;  Laterality: N/A;  takedown of sigmoid colostomy with anastomosis  . Bowel resection N/A 02/21/2013    Procedure: SMALL BOWEL RESECTION;  Surgeon: Mariella Saa, MD;  Location: [facility] OR;  Service: General;  Laterality: N/A;   Social History:   reports that he has never smoked. He has never used smokeless tobacco. He reports that he does not drink alcohol or use illicit drugs.  Family History  Problem Relation Age of Onset  . Hypertension Mother   . Hypertension Father   . Cancer Cousin     breast    Medications: Patient's Medications  New Prescriptions   No medications on file  Previous Medications    ALLOPURINOL (ZYLOPRIM) 300 MG TABLET    TAKE ONE TABLET BY MOUTH ONCE DAILY FOR GOUT   CALCIUM ACETATE (PHOSLO) 667 MG CAPSULE    Take 667-1,334 mg by mouth 6 (six) times daily. Take 2 capsules by mouth 3 times daily with meals and 1 capsule by mouth 3 times daily with snacks.   DARUNAVIR ETHANOLATE (PREZISTA) 800 MG TABLET    Take 1 tablet (800 mg total) by mouth daily with breakfast.   GABAPENTIN (NEURONTIN) 100 MG CAPSULE    Take 100 mg by mouth 3 (three) times daily.   LAMIVUDINE (EPIVIR) 100 MG TABLET    Take 0.5 tablets (50 mg total) by mouth daily.   RALTEGRAVIR (ISENTRESS) 400 MG TABLET  Take 1 tablet (400 mg total) by mouth 2 (two) times daily.   RITONAVIR (NORVIR) 100 MG TABS    Take 1 tablet (100 mg total) by mouth daily.   TDAP (BOOSTRIX) 5-2.5-18.5 LF-MCG/0.5 INJECTION    Inject 0.5 mLs into the muscle once.   ZIDOVUDINE (RETROVIR) 300 MG TABLET    Take 1 tablet (300 mg total) by mouth daily.  Modified Medications   No medications on file  Discontinued Medications   TRAMADOL (ULTRAM) 50 MG TABLET    Take 1 tablet (50 mg total) by mouth every 6 (six) hours as needed for pain.     Physical Exam:  Filed Vitals:   08/27/13 0815  BP: 118/72  Pulse: 77  Temp: 97.4 F (36.3 C)  TempSrc: Oral  Weight: 233 lb (105.688 kg)  SpO2: 95%    Physical Exam  Constitutional: He is oriented to person, place, and time. No distress.  HENT:  Mouth/Throat: Oropharynx is clear and moist. No oropharyngeal exudate.  Eyes: Conjunctivae and EOM are normal. Pupils are equal, round, and reactive to light.  Neck: Normal range of motion. Neck supple. No thyromegaly present.  Cardiovascular: Normal rate and regular rhythm.   Pulmonary/Chest: Effort normal and breath sounds normal. No respiratory distress. He exhibits no mass. Right breast exhibits no mass. Left breast exhibits no mass.  Enlarged breast tissue bilaterally; otherwise normal exam  Abdominal: Soft. Bowel sounds are normal. He  exhibits no distension.  Musculoskeletal: He exhibits no edema and no tenderness.  Lymphadenopathy:    He has no cervical adenopathy.  Neurological: He is alert and oriented to person, place, and time. No cranial nerve deficit.  Skin: Skin is warm and dry. He is not diaphoretic.  Psychiatric: Affect normal.     Labs reviewed: Basic Metabolic Panel:  Recent Labs  45/40/98 0930  02/27/13 0656 03/19/13 1011 06/06/13 1051 07/05/13 1424  NA  --   < > 134* 138 138 138  K  --   < > 2.8* 3.2* 3.5 3.3*  CL  --   < > 95* 96* 97 96  CO2  --   < > 24 25 31 28   GLUCOSE  --   < > 98 84 74 89  BUN  --   < > 53* 16 41* 35*  CREATININE  --   < > 6.80* 4.97* 5.43* 4.86*  CALCIUM  --   < > 9.0 9.4 9.5 9.3  PHOS 7.2*  --  5.2*  --   --   --   < > = values in this interval not displayed. Liver Function Tests:  Recent Labs  02/23/13 1020 02/27/13 0656 03/19/13 1011 06/06/13 1051  AST 47*  --  23 27  ALT 38  --  17 27  ALKPHOS 66  --  82 72  BILITOT 3.2*  --  0.7 0.6  PROT 8.0  --  7.3 7.8  ALBUMIN 3.1* 2.4*  --  4.0   No results found for this basename: LIPASE, AMYLASE,  in the last 8760 hours No results found for this basename: AMMONIA,  in the last 8760 hours CBC:  Recent Labs  02/27/13 0656 03/19/13 1011 06/06/13 1051 07/05/13 1424  WBC 5.4 7.3 4.6 5.9  NEUTROABS  --  4.5  --  3.4  HGB 10.8* 8.0* 13.2 11.7*  HCT 30.7* 24.3* 38.1* 34.2*  MCV 97.2 102* 100.3* 104.0*  PLT 167  --  182 189   Lipid Panel:  Recent Labs  03/19/13 1011  HDL 41  LDLCALC 61  TRIG 113  CHOLHDL 3.0   Assessment/Plan 1. ESRD (end stage renal disease) - conts dialysis   2. HIV INFECTION -on antiviral therapy; complaint with medications, following with ID   3. Neuropathy -stable - gabapentin (NEURONTIN) 100 MG capsule; Take 1 capsule (100 mg total) by mouth 3 (three) times daily.  Dispense: 90 capsule; Refill: 5  4. Gynecomastia -with enlarged breast tissue -may be related to HAART  therapy, pt to discuss with ID doctor to possible change therapy -If this is unsuccessful with work up further with breast US and labs (testosterone  levels)

## 2013-08-28 LAB — HIV-1 RNA QUANT-NO REFLEX-BLD
HIV 1 RNA Quant: <20 copies/mL (ref <20)
HIV-1 RNA Quant, Log: <1.3 {Log} (ref <1.30)

## 2013-08-28 LAB — T-HELPER CELL (CD4) - (RCID CLINIC ONLY)
CD4 % Helper T Cell: 25 % — ABNORMAL LOW (ref 33–55)
CD4 T Cell Abs: 340 /uL — ABNORMAL LOW (ref 400–2700)

## 2013-08-28 NOTE — Addendum Note (Signed)
Addended by: Mariea Clonts D on: 08/28/2013 03:12 PM   Modules accepted: Orders

## 2013-08-28 NOTE — Telephone Encounter (Signed)
He is an dialysis patient so the creatinine level will be high and must be correlated to when he gets dialyzed.

## 2013-08-29 LAB — LUTEINIZING HORMONE: LH: 13.5 m[IU]/mL — ABNORMAL HIGH (ref 1.5–9.3)

## 2013-08-29 LAB — TESTOSTERONE, FREE, TOTAL, SHBG: Testosterone: 690 ng/dL (ref 300–890)

## 2013-09-19 DIAGNOSIS — T82898A Other specified complication of vascular prosthetic devices, implants and grafts, initial encounter: Secondary | ICD-10-CM | POA: Diagnosis not present

## 2013-09-19 DIAGNOSIS — I871 Compression of vein: Secondary | ICD-10-CM | POA: Diagnosis not present

## 2013-09-19 DIAGNOSIS — N186 End stage renal disease: Secondary | ICD-10-CM | POA: Diagnosis not present

## 2013-10-01 ENCOUNTER — Ambulatory Visit: Payer: Medicaid Other | Admitting: Nurse Practitioner

## 2013-10-11 DIAGNOSIS — N186 End stage renal disease: Secondary | ICD-10-CM | POA: Diagnosis not present

## 2013-10-14 ENCOUNTER — Telehealth: Payer: Self-pay | Admitting: *Deleted

## 2013-10-14 NOTE — Telephone Encounter (Signed)
Spoke to Bryan Gilmore regarding his appointment on 10/15/13 per Jessica's recommendations.  He states that he is having a white discharge on his penis as well as ED issues, nipple soreness, and stomach swelling & light grey colored stools.  Advised him that he needed to get in with [facility] for the ED issues and probably needs to get back with Dr Sherrin Daisy since he did a reversal ostomy regarding the stomach swelling & stool color changes.  Pt still wants to see [name] and was told that was okay, will see him tomorrow.

## 2013-10-15 ENCOUNTER — Encounter: Payer: Self-pay | Admitting: Nurse Practitioner

## 2013-10-15 ENCOUNTER — Ambulatory Visit (INDEPENDENT_AMBULATORY_CARE_PROVIDER_SITE_OTHER): Payer: BC Managed Care – PPO | Admitting: Nurse Practitioner

## 2013-10-15 VITALS — BP 128/82 | HR 82 | Temp 98.2°F | Resp 16 | Wt 238.6 lb

## 2013-10-15 DIAGNOSIS — G629 Polyneuropathy, unspecified: Secondary | ICD-10-CM

## 2013-10-15 DIAGNOSIS — H109 Unspecified conjunctivitis: Secondary | ICD-10-CM

## 2013-10-15 DIAGNOSIS — B3749 Other urogenital candidiasis: Secondary | ICD-10-CM

## 2013-10-15 DIAGNOSIS — G589 Mononeuropathy, unspecified: Secondary | ICD-10-CM

## 2013-10-15 MED ORDER — GABAPENTIN 100 MG PO CAPS: 100.0000 mg | ORAL_CAPSULE | Freq: Three times a day (TID) | ORAL | Status: DC

## 2013-10-15 MED ORDER — GENTAMICIN SULFATE 0.3 % OP SOLN: 2.0000 [drp] | Freq: Four times a day (QID) | OPHTHALMIC | Status: AC

## 2013-10-15 MED ORDER — GERHARDT'S BUTT CREAM: TOPICAL_CREAM | CUTANEOUS | Status: DC

## 2013-10-15 NOTE — Progress Notes (Signed)
Patient ID: Bryan Gilmore, male   DOB: Dec 19, 1950, 63 y.o.   MRN: [id number]    No Known Allergies  Chief Complaint  Patient presents with  . Acute Visit    nipple pain, white discharge from penis, and ED issues.  also having reddness in the LT eye with itching under the eye.  has been using [facility]  . other    MMSE done    HPI: Patient is a 63 y.o. male seen in the office today for eye infection; he notices red eye, and itching for about 1 week; draining clear from eye.  Had yeast in the hospital and at the end of the day he will have white discharge around foreskin not discharge from penis  Has gained weight; no exercise, eating good; eats one large meal a day Went to ID and had labs done regarding breast enlargement but never got results  conts to follow up with Urology  Review of Systems:  Review of Systems  Constitutional: Negative for fever, chills and malaise/fatigue.  Respiratory: Negative for cough and shortness of breath.   Cardiovascular: Negative for chest pain, palpitations and leg swelling.  Gastrointestinal: Negative for heartburn, nausea, vomiting, abdominal pain, diarrhea and constipation.  Genitourinary: Negative for dysuria.       Min urine on HD  Musculoskeletal: Negative for myalgias.  Skin: Negative for itching and rash.  Neurological: Negative for dizziness, weakness and headaches.  Endo/Heme/Allergies:       Enlarged breast  Psychiatric/Behavioral: Negative for depression. The patient is not nervous/anxious and does not have insomnia.      Past Medical History  Diagnosis Date  . HIV (human immunodeficiency virus infection)   . Sepsis(995.91)   . Gout   . Hepatitis C   . Fournier's gangrene   . Anemia   . Severe protein-calorie malnutrition   . Alcohol abuse   . Dysrhythmia     AFib, not a candidate for coumadin , dr [name] signed off on pt  . Hypertension   . Colostomy status   . Sebaceous cyst   . Cough   . Unspecified constipation   . Gout,  unspecified   . GERD (gastroesophageal reflux disease)   . Atrial fibrillation   . Ulcer of lower limb, unspecified   . Ulcer of lower limb, unspecified   . Human immunodeficiency virus (HIV) disease   . Other severe protein-calorie malnutrition   . Disorders of phosphorus metabolism   . Hypopotassemia   . Anemia in chronic kidney disease(285.21)   . Anemia in chronic kidney disease(285.21)   . Anemia, unspecified   . Hepatitis, unspecified   . Altered mental status   . Hemoptysis, unspecified   . Acute respiratory failure   . Gangrene   . Sepsis(995.91)   . Peripheral vascular disease, unspecified     denies  . End stage renal disease     hemodialysis tues, thurs, sat-east fornesia  . End stage renal disease   . Arthritis     gout   Past Surgical History  Procedure Laterality Date  . Cystoscopy with urethral dilatation  02/17/2012    Procedure: CYSTOSCOPY WITH URETHRAL DILATATION;  Surgeon: Reece Packer, MD;  Location: [facility];  Service: Urology;  Laterality: N/A;  retrograde urethragram, placement of foley catheter exam under anesthesia   . Insertion of dialysis catheter  02/20/2012    Procedure: INSERTION OF DIALYSIS CATHETER;  Surgeon: Angelia Mould, MD;  Location: [place];  Service: Vascular;  Laterality: N/A;  .  Irrigation and debridement abscess  02/29/2012    Procedure: IRRIGATION AND DEBRIDEMENT ABSCESS;  Surgeon: Reece Packer, MD;  Location: [place];  Service: Urology;  Laterality: N/A;  I&D of scrotum abcess  . Insertion of dialysis catheter  03/29/2012    Procedure: INSERTION OF DIALYSIS CATHETER;  Surgeon: Rosetta Posner, MD;  Location: [facility] OR;  Service: Vascular;  Laterality: N/A;  Insertion tunneled dialysis catheter right IJ  . Av fistula placement  04/12/2012    Procedure: INSERTION OF ARTERIOVENOUS (AV) GORE-TEX GRAFT ARM;  Surgeon: Rosetta Posner, MD;  Location: [facility] OR;  Service: Vascular;  Laterality: Left;  insertion of 6x50 stretch goretex graft   .  Thrombectomy and revision of arterioventous (av) goretex  graft  07/15/2012    Procedure: THROMBECTOMY AND REVISION OF ARTERIOVENTOUS (AV) GORETEX  GRAFT;  Surgeon: Elam Dutch, MD;  Location: [place];  Service: Vascular;  Laterality: Left;  . Urethrogram  07/30/2012    Procedure: URETHROGRAM;  Surgeon: Reece Packer, MD;  Location: [facility];  Service: Urology;  Laterality: N/A;  cystogram  . Cystoscopy  07/30/2012    Procedure: CYSTOSCOPY;  Surgeon: Reece Packer, MD;  Location: [facility];  Service: Urology;  Laterality: Bilateral;  retrograde  . Insertion of dialysis catheter  08/04/2012    Procedure: INSERTION OF DIALYSIS CATHETER;  Surgeon: Angelia Mould, MD;  Location: [place];  Service: Vascular;  Laterality: Right;  right intrajugular hemodialysis catheter insertion  . Av fistula placement  08/14/2012    Procedure: INSERTION OF ARTERIOVENOUS (AV) GORE-TEX GRAFT ARM;  Surgeon: Mal Misty, MD;  Location: [place];  Service: Vascular;  Laterality: Left;  . Colostomy  02/2012    d/t an infection  . Av fistula placement Right 11/08/2012    Procedure: ARTERIOVENOUS (AV) FISTULA CREATION;  Surgeon: Angelia Mould, MD;  Location: [facility] OR;  Service: Vascular;  Laterality: Right;  . Hernia repair      Umbilical  . Surgery for fournier's gangrene  02/2012  . Colon surgery  13    colostomy  . Colostomy closure  02/21/2013  . Colostomy closure N/A 02/21/2013    Procedure: COLOSTOMY CLOSURE;  Surgeon: [name], MD;  Location: [place];  Service: General;  Laterality: N/A;  takedown of sigmoid colostomy with anastomosis  . Bowel resection N/A 02/21/2013    Procedure: SMALL BOWEL RESECTION;  Surgeon: [name], MD;  Location: [place];  Service: General;  Laterality: N/A;   Social History:   reports that he has never smoked. He has never used smokeless tobacco. He reports that he does not drink alcohol or use illicit drugs.  Family History  Problem Relation Age of  Onset  . Hypertension Mother   . Hypertension Father   . Cancer Cousin     breast    Medications: Patient's Medications  New Prescriptions   No medications on file  Previous Medications   ALLOPURINOL (ZYLOPRIM) 300 MG TABLET    TAKE ONE TABLET BY MOUTH ONCE DAILY FOR GOUT   CALCIUM ACETATE (PHOSLO) 667 MG CAPSULE    Take 667-1,334 mg by mouth 6 (six) times daily. Take 2 capsules by mouth 3 times daily with meals and 1 capsule by mouth 3 times daily with snacks.   DARUNAVIR ETHANOLATE (PREZISTA) 800 MG TABLET    Take 1 tablet (800 mg total) by mouth daily with breakfast.   GABAPENTIN (NEURONTIN) 100 MG CAPSULE    Take 1 capsule (100 mg  total) by mouth 3 (three) times daily.   LAMIVUDINE (EPIVIR) 100 MG TABLET    Take 0.5 tablets (50 mg total) by mouth daily.   RALTEGRAVIR (ISENTRESS) 400 MG TABLET    Take 1 tablet (400 mg total) by mouth 2 (two) times daily.   RITONAVIR (NORVIR) 100 MG TABS    Take 1 tablet (100 mg total) by mouth daily.   ZIDOVUDINE (RETROVIR) 300 MG TABLET    Take 1 tablet (300 mg total) by mouth daily.  Modified Medications   No medications on file  Discontinued Medications   TDAP (BOOSTRIX) 5-2.5-18.5 LF-MCG/0.5 INJECTION    Inject 0.5 mLs into the muscle once.     Physical Exam:  Filed Vitals:   10/15/13 1039  BP: 128/82  Pulse: 82  Temp: 98.2 F (36.8 C)  TempSrc: Oral  Resp: 16  Weight: 238 lb 9.6 oz (108.228 kg)  SpO2: 98%   Physical Exam  Constitutional: He is oriented to person, place, and time. No distress.  HENT:  Mouth/Throat: Oropharynx is clear and moist. No oropharyngeal exudate.  Eyes: Conjunctivae and EOM are normal. Pupils are equal, round, and reactive to light.  Neck: Normal range of motion. Neck supple. No thyromegaly present.  Cardiovascular: Normal rate and regular rhythm.   Pulmonary/Chest: Effort normal and breath sounds normal. No respiratory distress. He exhibits no mass. Right breast exhibits no mass. Left breast exhibits no  mass.  Enlarged breast tissue bilaterally; otherwise normal exam  Abdominal: Soft. Bowel sounds are normal. He exhibits no distension and no mass. There is no tenderness. There is no rebound.  Obese abdomen   Genitourinary: Penis normal. No discharge found.  Musculoskeletal: He exhibits no edema and no tenderness.  Lymphadenopathy:    He has no cervical adenopathy.  Neurological: He is alert and oriented to person, place, and time. No cranial nerve deficit.  Skin: Skin is warm and dry. No rash noted. He is not diaphoretic. No erythema.  Psychiatric: Affect normal.    Labs reviewed: Basic Metabolic Panel:  Recent Labs  02/22/13 0930  02/27/13 0656  06/06/13 1051 07/05/13 1424 08/27/13 1058  NA  --   < > 134*  < > 138 138 138  K  --   < > 2.8*  < > 3.5 3.3* 4.4  CL  --   < > 95*  < > 97 96 99  CO2  --   < > 24  < > 31 28 26   GLUCOSE  --   < > 98  < > 74 89 91  BUN  --   < > 53*  < > 41* 35* 39*  CREATININE  --   < > 6.80*  < > 5.43* 4.86* 7.81*  CALCIUM  --   < > 9.0  < > 9.5 9.3 9.1  PHOS 7.2*  --  5.2*  --   --   --   --   TSH  --   --   --   --   --   --  5.089*  < > = values in this interval not displayed. Liver Function Tests:  Recent Labs  02/27/13 0656 03/19/13 1011 06/06/13 1051 08/27/13 1058  AST  --  23 27 29   ALT  --  17 27 24   ALKPHOS  --  82 72 55  BILITOT  --  0.7 0.6 0.5  PROT  --  7.3 7.8 7.5  ALBUMIN 2.4*  --  4.0 3.7  No results found for this basename: LIPASE, AMYLASE,  in the last 8760 hours No results found for this basename: AMMONIA,  in the last 8760 hours CBC:  Recent Labs  02/27/13 0656 03/19/13 1011 06/06/13 1051 07/05/13 1424  WBC 5.4 7.3 4.6 5.9  NEUTROABS  --  4.5  --  3.4  HGB 10.8* 8.0* 13.2 11.7*  HCT 30.7* 24.3* 38.1* 34.2*  MCV 97.2 102* 100.3* 104.0*  PLT 167  --  182 189   Lipid Panel:  Recent Labs  03/19/13 1011  HDL 41  LDLCALC 61  TRIG 113  CHOLHDL 3.0   TSH:  Recent Labs  08/27/13 1058  TSH 5.089*     A1C: No results found for this basename: HGBA1C    Assessment/Plan 1. Yeast dermatitis of penis - Hydrocortisone (GERHARDT'S BUTT CREAM) CREA; twice daily As needed to affected area  Dispense: 1 each; Refill: 3  2. Conjunctivitis of left eye - gentamicin (GARAMYCIN) 0.3 % ophthalmic solution; Place 2 drops into the left eye 4 (four) times daily.  Dispense: 5 mL; Refill: 0  3. Neuropathy -chronic - gabapentin (NEURONTIN) 100 MG capsule; Take 1 capsule (100 mg total) by mouth 3 (three) times daily.  Dispense: 90 capsule; Refill: 5  4. 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

## 2013-10-15 NOTE — Patient Instructions (Signed)
Please keep followup appts; will give medication for eye and yeast Follow up if this does not improve

## 2013-10-15 NOTE — Progress Notes (Signed)
Passed clock drawing 

## 2013-10-28 ENCOUNTER — Ambulatory Visit: Payer: Medicare Other | Admitting: Internal Medicine

## 2013-11-10 ENCOUNTER — Ambulatory Visit (INDEPENDENT_AMBULATORY_CARE_PROVIDER_SITE_OTHER): Payer: BC Managed Care – PPO | Admitting: Internal Medicine

## 2013-11-10 ENCOUNTER — Encounter: Payer: Self-pay | Admitting: Internal Medicine

## 2013-11-10 VITALS — BP 140/90 | HR 80 | Temp 97.5°F | Wt 236.0 lb

## 2013-11-10 DIAGNOSIS — B1920 Unspecified viral hepatitis C without hepatic coma: Secondary | ICD-10-CM | POA: Diagnosis not present

## 2013-11-10 DIAGNOSIS — B20 Human immunodeficiency virus [HIV] disease: Secondary | ICD-10-CM | POA: Diagnosis not present

## 2013-11-10 LAB — CBC WITH DIFFERENTIAL/PLATELET
BASOS ABS: 0 10*3/uL (ref 0.0–0.1)
BASOS PCT: 1 % (ref 0–1)
Eosinophils Absolute: 0.1 10*3/uL (ref 0.0–0.7)
Eosinophils Relative: 2 % (ref 0–5)
HCT: 31.9 % — ABNORMAL LOW (ref 39.0–52.0)
Hemoglobin: 10.8 g/dL — ABNORMAL LOW (ref 13.0–17.0)
LYMPHS PCT: 31 % (ref 12–46)
Lymphs Abs: 1.1 10*3/uL (ref 0.7–4.0)
MCH: 36 pg — AB (ref 26.0–34.0)
MCHC: 33.9 g/dL (ref 30.0–36.0)
MCV: 106.3 fL — ABNORMAL HIGH (ref 78.0–100.0)
Monocytes Absolute: 0.5 10*3/uL (ref 0.1–1.0)
Monocytes Relative: 15 % — ABNORMAL HIGH (ref 3–12)
Neutro Abs: 1.8 10*3/uL (ref 1.7–7.7)
Neutrophils Relative %: 51 % (ref 43–77)
PLATELETS: 211 10*3/uL (ref 150–400)
RBC: 3 MIL/uL — ABNORMAL LOW (ref 4.22–5.81)
RDW: 14.7 % (ref 11.5–15.5)
WBC: 3.5 10*3/uL — AB (ref 4.0–10.5)

## 2013-11-10 NOTE — Progress Notes (Signed)
Cc: routine visit Subjective:    Patient ID: Bryan Gilmore, male    DOB: 07-20-1951, 63 y.o.   MRN: [id number]  HPI 63yo M with HIV-HCV plus ESRD on HD, CD 4 count of 340/VL<20 (dec 2014), on RLG/DRVr/epivir/retrovir. Doing well with exception to noticing increasing bilateral breast tenderness and growth. In addition, having increasing abdominal girth x 6 months. He states that he is constantly above his dry weight. Not currently on any diuretics. Still produces urine. Denies any recent illness  Current Outpatient Prescriptions on File Prior to Visit  Medication Sig Dispense Refill  . allopurinol (ZYLOPRIM) 300 MG tablet TAKE ONE TABLET BY MOUTH ONCE DAILY FOR GOUT  30 tablet  0  . calcium acetate (PHOSLO) 667 MG capsule Take 667-1,334 mg by mouth 6 (six) times daily. Take 2 capsules by mouth 3 times daily with meals and 1 capsule by mouth 3 times daily with snacks.      . Darunavir Ethanolate (PREZISTA) 800 MG tablet Take 1 tablet (800 mg total) by mouth daily with breakfast.  30 tablet  11  . gabapentin (NEURONTIN) 100 MG capsule Take 1 capsule (100 mg total) by mouth 3 (three) times daily.  90 capsule  5  . Hydrocortisone (GERHARDT'S BUTT CREAM) CREA twice daily As needed to affected area  1 each  3  . lamivudine (EPIVIR) 100 MG tablet Take 0.5 tablets (50 mg total) by mouth daily.  30 tablet  6  . raltegravir (ISENTRESS) 400 MG tablet Take 1 tablet (400 mg total) by mouth 2 (two) times daily.  60 tablet  11  . ritonavir (NORVIR) 100 MG TABS Take 1 tablet (100 mg total) by mouth daily.  30 tablet  11  . zidovudine (RETROVIR) 300 MG tablet Take 1 tablet (300 mg total) by mouth daily.  30 tablet  11   No current facility-administered medications on file prior to visit.   Active Ambulatory Problems    Diagnosis Date Noted  . HIV INFECTION 09/15/2010  . HEPATITIS C 10/07/2010  . GOUT, CHRONIC 10/07/2010  . 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 Normocytic anemia 02/17/2012  . ESRD  (end stage renal disease) 02/29/2012  . Anemia of chronic renal failure 02/29/2012  . Hyperkalemia 03/29/2012  . Secondary hyperparathyroidism (of renal origin) 03/29/2012  . Fever 04/01/2012  . Delirium 04/01/2012  . Atrial fibrillation, permanent 04/04/2012  . Alcohol abuse   . Metabolic encephalopathy 91/47/8295  . Fournier's gangrene in male 06/05/2012  . End stage renal disease 10/30/2012  . Aftercare following surgery of the circulatory system, [place] 12/18/2012  . Colostomy status 12/26/2012  . Other complications due to renal dialysis device, implant, and graft 01/29/2013   Resolved Ambulatory Problems    Diagnosis Date Noted  . Decreased libido 10/07/2010  . Sepsis secondary to suspected Fornier's gangrene 02/17/2012  . ARF (acute renal failure) in the setting of stage III CKD 02/17/2012  . Metabolic acidosis 62/13/0865  . Hyperkalemia 02/17/2012  . Hyponatremia 02/17/2012  . Acute respiratory failure 02/17/2012   Past Medical History  Diagnosis Date  . HIV (human immunodeficiency virus infection)   . Gout   . Hepatitis C   . Fournier's gangrene   . Anemia   . Severe protein-calorie malnutrition   . Dysrhythmia   . Hypertension   . Sebaceous cyst   . Cough   . Unspecified constipation   . Gout, unspecified   . GERD (gastroesophageal reflux disease)   . Atrial fibrillation   . Ulcer  of lower limb, unspecified   . Ulcer of lower limb, unspecified   . Other severe protein-calorie malnutrition   . Disorders of phosphorus metabolism   . Hypopotassemia   . Anemia in chronic kidney disease(285.21)   . Anemia in chronic kidney disease(285.21)   . Anemia, unspecified   . Hepatitis, unspecified   . Altered mental status   . Hemoptysis, unspecified   . Gangrene   . Peripheral vascular disease, unspecified   . Arthritis       Review of Systems Breast tenderness and increased abdominal girth    Objective:   Physical Exam BP 140/90  Pulse 80  Temp(Src) 97.5  F (36.4 C) (Oral)  Wt 236 lb (107.049 kg)  Constitutional: He is oriented to person, place, and time. He appears well-developed and well-nourished. No distress.  HENT:  Mouth/Throat: Oropharynx is clear and moist. No oropharyngeal exudate.  Cardiovascular: Normal rate, regular rhythm and normal heart sounds. Exam reveals no gallop and no friction rub.  No murmur heard.  Pulmonary/Chest: Effort normal and breath sounds normal. No respiratory distress. He has no wheezes.  Abdominal: Soft. Bowel sounds are normal. He exhibits no distension. There is no tenderness. Scarring midline and left lower quadrant Lymphadenopathy:  He has no cervical adenopathy.  Neurological: He is alert and oriented to person, place, and time.  Skin: Skin is warm and dry. No rash noted. No erythema.  Psychiatric: He has a normal mood and affect. His behavior is normal.       Assessment & Plan:  HIV = will check cd 4 coutn and viral load today. Appears well controlled. Continue with current regimen. Will need refills.  hcv =  Hep C Virus 1b genotype. With 7.5MU VL. concern that he may have increasing abdominal girth/ascites. abd u/s today to evaluate for ascites. Will refer to hepatology for further management of cirrhosis. Will send out for fibroscan to see if he would be a candidate for HCV treatment  Gynecomastia = likely related to his liver disease.  rtc in 6 wks

## 2013-11-11 LAB — T-HELPER CELL (CD4) - (RCID CLINIC ONLY)
CD4 % Helper T Cell: 33 % (ref 33–55)
CD4 T Cell Abs: 360 /uL — ABNORMAL LOW (ref 400–2700)

## 2013-11-12 ENCOUNTER — Ambulatory Visit (INDEPENDENT_AMBULATORY_CARE_PROVIDER_SITE_OTHER): Payer: BC Managed Care – PPO | Admitting: Nurse Practitioner

## 2013-11-12 ENCOUNTER — Encounter: Payer: Self-pay | Admitting: Nurse Practitioner

## 2013-11-12 VITALS — BP 120/78 | HR 89 | Temp 99.2°F | Wt 232.0 lb

## 2013-11-12 DIAGNOSIS — N186 End stage renal disease: Secondary | ICD-10-CM | POA: Diagnosis not present

## 2013-11-12 DIAGNOSIS — D631 Anemia in chronic kidney disease: Secondary | ICD-10-CM | POA: Diagnosis not present

## 2013-11-12 DIAGNOSIS — B20 Human immunodeficiency virus [HIV] disease: Secondary | ICD-10-CM | POA: Diagnosis not present

## 2013-11-12 DIAGNOSIS — N62 Hypertrophy of breast: Secondary | ICD-10-CM

## 2013-11-12 DIAGNOSIS — N189 Chronic kidney disease, unspecified: Secondary | ICD-10-CM

## 2013-11-12 DIAGNOSIS — M1A00X0 Idiopathic chronic gout, unspecified site, without tophus (tophi): Secondary | ICD-10-CM | POA: Diagnosis not present

## 2013-11-12 DIAGNOSIS — J069 Acute upper respiratory infection, unspecified: Secondary | ICD-10-CM | POA: Diagnosis not present

## 2013-11-12 DIAGNOSIS — N039 Chronic nephritic syndrome with unspecified morphologic changes: Secondary | ICD-10-CM

## 2013-11-12 DIAGNOSIS — B1920 Unspecified viral hepatitis C without hepatic coma: Secondary | ICD-10-CM | POA: Diagnosis not present

## 2013-11-12 DIAGNOSIS — M1A9XX0 Chronic gout, unspecified, without tophus (tophi): Secondary | ICD-10-CM

## 2013-11-12 LAB — HIV-1 RNA QUANT-NO REFLEX-BLD
HIV 1 RNA Quant: <20 copies/mL (ref <20)
HIV-1 RNA Quant, Log: <1.3 {Log} (ref <1.30)

## 2013-11-12 MED ORDER — SACCHAROMYCES BOULARDII 250 MG PO CAPS: 250.0000 mg | ORAL_CAPSULE | Freq: Two times a day (BID) | ORAL | Status: DC

## 2013-11-12 MED ORDER — DOXYCYCLINE HYCLATE 100 MG PO TABS: 100.0000 mg | ORAL_TABLET | Freq: Two times a day (BID) | ORAL | Status: DC

## 2013-11-12 NOTE — Progress Notes (Signed)
Patient ID: Bryan Gilmore, male   DOB: 1950-10-03, 63 y.o.   MRN: [id number]    No Known Allergies  Chief Complaint  Patient presents with  . Medical Managment of Chronic Issues    1 month follow-up, ongoing nipple pain. Patient seen ID doctor on Monday   . URI    Chest congestion and productive cough (worse at night)    HPI: Patient is a 63 y.o. male seen in the office today for routine follow up Feeling bad, has chest congestion, productive cough, decrease appetite, no fevers at home that he aware of and no fever at dialysis. No shortness of breath, chest pains. Reports runny nose and sore throat.  No recent gout flares Left eye doing better since drops and yeast on penis is better, no drainage.  Reports urology is not seeing him anymore due to unpaid bills.   Review of Systems:  Review of Systems  Constitutional: Positive for malaise/fatigue. Negative for fever and chills.  HENT: Positive for congestion and sore throat. Negative for ear discharge and ear pain.   Eyes: Negative.   Respiratory: Positive for cough and sputum production. Negative for shortness of breath and wheezing.   Cardiovascular: Negative for chest pain and palpitations.  Gastrointestinal: Negative for heartburn, diarrhea and constipation.  Genitourinary: Negative for dysuria.  Musculoskeletal: Negative for myalgias.  Skin: Negative.   Neurological: Positive for weakness. Negative for dizziness and headaches.     Past Medical History  Diagnosis Date  . HIV (human immunodeficiency virus infection)   . Sepsis(995.91)   . Gout   . Hepatitis C   . Fournier's gangrene   . Anemia   . Severe protein-calorie malnutrition   . Alcohol abuse   . Dysrhythmia     AFib, not a candidate for coumadin , dr [name] signed off on pt  . Hypertension   . Colostomy status   . Sebaceous cyst   . Cough   . Unspecified constipation   . Gout, unspecified   . GERD (gastroesophageal reflux disease)   . Atrial fibrillation    . Ulcer of lower limb, unspecified   . Ulcer of lower limb, unspecified   . Human immunodeficiency virus (HIV) disease   . Other severe protein-calorie malnutrition   . Disorders of phosphorus metabolism   . Hypopotassemia   . Anemia in chronic kidney disease(285.21)   . Anemia in chronic kidney disease(285.21)   . Anemia, unspecified   . Hepatitis, unspecified   . Altered mental status   . Hemoptysis, unspecified   . Acute respiratory failure   . Gangrene   . Sepsis(995.91)   . Peripheral vascular disease, unspecified     denies  . End stage renal disease     hemodialysis tues, thurs, sat-east fornesia  . End stage renal disease   . Arthritis     gout   Past Surgical History  Procedure Laterality Date  . Cystoscopy with urethral dilatation  02/17/2012    Procedure: CYSTOSCOPY WITH URETHRAL DILATATION;  Surgeon: Reece Packer, MD;  Location: [facility];  Service: Urology;  Laterality: N/A;  retrograde urethragram, placement of foley catheter exam under anesthesia   . Insertion of dialysis catheter  02/20/2012    Procedure: INSERTION OF DIALYSIS CATHETER;  Surgeon: Angelia Mould, MD;  Location: [facility] OR;  Service: Vascular;  Laterality: N/A;  . Irrigation and debridement abscess  02/29/2012    Procedure: IRRIGATION AND DEBRIDEMENT ABSCESS;  Surgeon: Reece Packer, MD;  Location: [facility]  OR;  Service: Urology;  Laterality: N/A;  I&D of scrotum abcess  . Insertion of dialysis catheter  03/29/2012    Procedure: INSERTION OF DIALYSIS CATHETER;  Surgeon: Rosetta Posner, MD;  Location: [facility] OR;  Service: Vascular;  Laterality: N/A;  Insertion tunneled dialysis catheter right IJ  . Av fistula placement  04/12/2012    Procedure: INSERTION OF ARTERIOVENOUS (AV) GORE-TEX GRAFT ARM;  Surgeon: Rosetta Posner, MD;  Location: [facility] OR;  Service: Vascular;  Laterality: Left;  insertion of 6x50 stretch goretex graft   . Thrombectomy and revision of arterioventous (av) goretex  graft  07/15/2012     Procedure: THROMBECTOMY AND REVISION OF ARTERIOVENTOUS (AV) GORETEX  GRAFT;  Surgeon: Elam Dutch, MD;  Location: [place];  Service: Vascular;  Laterality: Left;  . Urethrogram  07/30/2012    Procedure: URETHROGRAM;  Surgeon: Reece Packer, MD;  Location: [facility];  Service: Urology;  Laterality: N/A;  cystogram  . Cystoscopy  07/30/2012    Procedure: CYSTOSCOPY;  Surgeon: Reece Packer, MD;  Location: [facility];  Service: Urology;  Laterality: Bilateral;  retrograde  . Insertion of dialysis catheter  08/04/2012    Procedure: INSERTION OF DIALYSIS CATHETER;  Surgeon: Angelia Mould, MD;  Location: [place];  Service: Vascular;  Laterality: Right;  right intrajugular hemodialysis catheter insertion  . Av fistula placement  08/14/2012    Procedure: INSERTION OF ARTERIOVENOUS (AV) GORE-TEX GRAFT ARM;  Surgeon: Mal Misty, MD;  Location: [place];  Service: Vascular;  Laterality: Left;  . Colostomy  02/2012    d/t an infection  . Av fistula placement Right 11/08/2012    Procedure: ARTERIOVENOUS (AV) FISTULA CREATION;  Surgeon: Angelia Mould, MD;  Location: [facility] OR;  Service: Vascular;  Laterality: Right;  . Hernia repair      Umbilical  . Surgery for fournier's gangrene  02/2012  . Colon surgery  13    colostomy  . Colostomy closure  02/21/2013  . Colostomy closure N/A 02/21/2013    Procedure: COLOSTOMY CLOSURE;  Surgeon: [name], MD;  Location: [place];  Service: General;  Laterality: N/A;  takedown of sigmoid colostomy with anastomosis  . Bowel resection N/A 02/21/2013    Procedure: SMALL BOWEL RESECTION;  Surgeon: [name], MD;  Location: [place];  Service: General;  Laterality: N/A;   Social History:   reports that he has never smoked. He has never used smokeless tobacco. He reports that he does not drink alcohol or use illicit drugs.  Family History  Problem Relation Age of Onset  . Hypertension Mother   . Hypertension Father   . Cancer Cousin      breast    Medications: Patient's Medications  New Prescriptions   No medications on file  Previous Medications   ALLOPURINOL (ZYLOPRIM) 300 MG TABLET    TAKE ONE TABLET BY MOUTH ONCE DAILY FOR GOUT   CALCIUM ACETATE (PHOSLO) 667 MG CAPSULE    Take 667-1,334 mg by mouth 6 (six) times daily. Take 2 capsules by mouth 3 times daily with meals and 1 capsule by mouth 3 times daily with snacks.   DARUNAVIR ETHANOLATE (PREZISTA) 800 MG TABLET    Take 1 tablet (800 mg total) by mouth daily with breakfast.   GABAPENTIN (NEURONTIN) 100 MG CAPSULE    Take 1 capsule (100 mg total) by mouth 3 (three) times daily.   HYDROCORTISONE (GERHARDT'S BUTT CREAM) CREA    twice daily As needed to affected  area   LAMIVUDINE (EPIVIR) 100 MG TABLET    Take 0.5 tablets (50 mg total) by mouth daily.   RALTEGRAVIR (ISENTRESS) 400 MG TABLET    Take 1 tablet (400 mg total) by mouth 2 (two) times daily.   RITONAVIR (NORVIR) 100 MG TABS    Take 1 tablet (100 mg total) by mouth daily.   ZIDOVUDINE (RETROVIR) 300 MG TABLET    Take 1 tablet (300 mg total) by mouth daily.  Modified Medications   No medications on file  Discontinued Medications   No medications on file     Physical Exam:  Filed Vitals:   11/12/13 0820  BP: 120/78  Pulse: 89  Temp: 99.2 F (37.3 C)  TempSrc: Oral  Weight: 232 lb (105.235 kg)  SpO2: 95%    Physical Exam  Constitutional: He is oriented to person, place, and time and well-developed, well-nourished, and in no distress.  HENT:  Head: Normocephalic and atraumatic.  Right Ear: External ear normal.  Left Ear: External ear normal.  Nose: Nose normal.  Mouth/Throat: Oropharynx is clear and moist. No oropharyngeal exudate.  Eyes: Pupils are equal, round, and reactive to light.  Neck: Normal range of motion. Neck supple. No thyromegaly present.  Cardiovascular: Normal rate, regular rhythm and normal heart sounds.   Pulmonary/Chest: Effort normal and breath sounds normal. No respiratory  distress. He has no wheezes. He has no rales.  Abdominal: Soft. Bowel sounds are normal. He exhibits no distension. There is no tenderness.  Rounded abdomen   Musculoskeletal: He exhibits no edema and no tenderness.  Neurological: He is alert and oriented to person, place, and time.  Skin: Skin is warm and dry. No erythema.  Psychiatric: Affect normal.     Labs reviewed: Basic Metabolic Panel:  Recent Labs  02/22/13 0930  02/27/13 0656  06/06/13 1051 07/05/13 1424 08/27/13 1058  NA  --   < > 134*  < > 138 138 138  K  --   < > 2.8*  < > 3.5 3.3* 4.4  CL  --   < > 95*  < > 97 96 99  CO2  --   < > 24  < > 31 28 26   GLUCOSE  --   < > 98  < > 74 89 91  BUN  --   < > 53*  < > 41* 35* 39*  CREATININE  --   < > 6.80*  < > 5.43* 4.86* 7.81*  CALCIUM  --   < > 9.0  < > 9.5 9.3 9.1  PHOS 7.2*  --  5.2*  --   --   --   --   TSH  --   --   --   --   --   --  5.089*  < > = values in this interval not displayed. Liver Function Tests:  Recent Labs  02/27/13 0656 03/19/13 1011 06/06/13 1051 08/27/13 1058  AST  --  23 27 29   ALT  --  17 27 24   ALKPHOS  --  82 72 55  BILITOT  --  0.7 0.6 0.5  PROT  --  7.3 7.8 7.5  ALBUMIN 2.4*  --  4.0 3.7   No results found for this basename: LIPASE, AMYLASE,  in the last 8760 hours No results found for this basename: AMMONIA,  in the last 8760 hours CBC:  Recent Labs  03/19/13 1011 06/06/13 1051 07/05/13 1424 11/10/13 1025  WBC 7.3 4.6 5.9  3.5*  NEUTROABS 4.5  --  3.4 1.8  HGB 8.0* 13.2 11.7* 10.8*  HCT 24.3* 38.1* 34.2* 31.9*  MCV 102* 100.3* 104.0* 106.3*  PLT  --  182 189 211   Lipid Panel:  Recent Labs  03/19/13 1011  HDL 41  LDLCALC 61  TRIG 113  CHOLHDL 3.0   TSH:  Recent Labs  08/27/13 1058  TSH 5.089*     Assessment/Plan 1. GOUT, CHRONIC -no recent flares, conts on allopurinol   2. ESRD (end stage renal disease) -conts with dialysis on MWF  3. HIV INFECTION -conts to follow up ID, question if testerone  and lack of interest is related to HIV medications, however he is well controlled on current therapy   4. Anemia of chronic renal failure -stable on recent labs  5. Gynecomastia Thought to be related to liver disease per ID; pt with hep C, ID ordered ultrasound to rule out ascites and has sent for hepatology referral   6.. Upper respiratory infection with cough and congestion -maintain adequate hydration and nutrition -mucinex DM twice daily for 1 week  - doxycycline (VIBRA-TABS) 100 MG tablet; Take 1 tablet (100 mg total) by mouth 2 (two) times daily.  Dispense: 20 tablet; Refill: 0 - saccharomyces boulardii (FLORASTOR) 250 MG capsule; Take 1 capsule (250 mg total) by mouth 2 (two) times daily.  Dispense: 30 capsule; Refill: 0 -given return precautions   To follow up in July for EV with blood work before visit

## 2013-11-12 NOTE — Patient Instructions (Signed)
Follow up in July for physical with blood work before visit  -antibiotic twice daily-- sent to pharmacy  -take with probiotic   Upper Respiratory Infection, Adult An upper respiratory infection (URI) is also known as the common cold. It is often caused by a type of germ (virus). Colds are easily spread (contagious). You can pass it to others by kissing, coughing, sneezing, or drinking out of the same glass. Usually, you get better in 1 or 2 weeks.  HOME CARE   Only take medicine as told by your doctor.  Use a warm mist humidifier or breathe in steam from a hot shower.  Drink enough water and fluids to keep your pee (urine) clear or pale yellow.  Get plenty of rest.  Return to work when your temperature is back to normal or as told by your doctor. You may use a face mask and wash your hands to stop your cold from spreading. GET HELP RIGHT AWAY IF:   After the first few days, you feel you are getting worse.  You have questions about your medicine.  You have chills, shortness of breath, or brown or red spit (mucus).  You have yellow or brown snot (nasal discharge) or pain in the face, especially when you bend forward.  You have a fever, puffy (swollen) neck, pain when you swallow, or white spots in the back of your throat.  You have a bad headache, ear pain, sinus pain, or chest pain.  You have a high-pitched whistling sound when you breathe in and out (wheezing).  You have a lasting cough or cough up blood.  You have sore muscles or a stiff neck. MAKE SURE YOU:   Understand these instructions.  Will watch your condition.  Will get help right away if you are not doing well or get worse. Document Released: 02/14/2008 Document Revised: 11/20/2011 Document Reviewed: 01/02/2011 [facility] Patient Information 2014 [place], [place].

## 2013-12-09 ENCOUNTER — Telehealth: Payer: Self-pay | Admitting: *Deleted

## 2013-12-09 NOTE — Telephone Encounter (Signed)
Called patient with appt for US Abdominal. Gave him 12/10/13 at 1245 pm at [facility] Radiology Department. Advised him once we get the results of that we can refer him to [facility] or Dr Linus Salmons.

## 2013-12-10 ENCOUNTER — Ambulatory Visit (HOSPITAL_COMMUNITY)
Admission: RE | Admit: 2013-12-10 | Discharge: 2013-12-10 | Disposition: A | Discharge status: Home / Self Care | Payer: BC Managed Care – PPO | Source: Ambulatory Visit | Attending: Internal Medicine | Admitting: Internal Medicine

## 2013-12-10 DIAGNOSIS — B1920 Unspecified viral hepatitis C without hepatic coma: Secondary | ICD-10-CM | POA: Diagnosis not present

## 2013-12-10 DIAGNOSIS — R143 Flatulence: Principal | ICD-10-CM

## 2013-12-10 DIAGNOSIS — R142 Eructation: Principal | ICD-10-CM | POA: Insufficient documentation

## 2013-12-10 DIAGNOSIS — R141 Gas pain: Secondary | ICD-10-CM | POA: Diagnosis not present

## 2013-12-15 ENCOUNTER — Telehealth: Payer: Self-pay | Admitting: *Deleted

## 2013-12-15 NOTE — Telephone Encounter (Signed)
Pt calling about Gastroenterology referral.  Ultrasound completed last week.

## 2013-12-17 NOTE — Telephone Encounter (Signed)
Remind me who is [facility]? i just need him to see a gastroenterologist to evaluate his cirrhosis to see if he needs an EGD. Rob and I can treat his hep c

## 2013-12-17 NOTE — Telephone Encounter (Signed)
Dr Baxter Flattery the patient has had his ultrasound but in order for [facility] to take him as a patient he has to have a viral load. Is it ok to put the order in and have him come for the lab work? And if so is there anything else you would like to include with the labs?

## 2013-12-22 ENCOUNTER — Other Ambulatory Visit: Payer: Self-pay | Admitting: Licensed Clinical Social Worker

## 2013-12-22 DIAGNOSIS — B182 Chronic viral hepatitis C: Secondary | ICD-10-CM

## 2013-12-24 ENCOUNTER — Other Ambulatory Visit: Payer: Medicare Other

## 2013-12-24 DIAGNOSIS — B182 Chronic viral hepatitis C: Secondary | ICD-10-CM

## 2013-12-26 LAB — HEPATITIS C RNA QUANTITATIVE
HCV QUANT: 3031346 [IU]/mL — AB (ref <15)
HCV Quantitative Log: 6.48 {Log} — ABNORMAL HIGH (ref <1.18)

## 2013-12-29 DIAGNOSIS — N186 End stage renal disease: Secondary | ICD-10-CM | POA: Diagnosis not present

## 2013-12-29 DIAGNOSIS — T82898A Other specified complication of vascular prosthetic devices, implants and grafts, initial encounter: Secondary | ICD-10-CM | POA: Diagnosis not present

## 2013-12-29 DIAGNOSIS — I871 Compression of vein: Secondary | ICD-10-CM | POA: Diagnosis not present

## 2014-01-01 ENCOUNTER — Telehealth: Payer: Self-pay | Admitting: Licensed Clinical Social Worker

## 2014-01-01 NOTE — Telephone Encounter (Signed)
Called the patient to advise that the information needed to refer him to [facility] has been sent and received and that once they review the information they will call him and schedule the appt. I advised him they also let us know here and once they do that I will call him to make sure he knows. The patient asked what happens if they give him a day he can not do and I advised him he can call and reschedule for a better time advised him can not guarantee it will be a sooner appt. He was fine with that plan.

## 2014-01-01 NOTE — Telephone Encounter (Signed)
Patient calling for labs results and would like to know if test has been scheduled for "liver"?   Laverle Patter, RN

## 2014-01-14 ENCOUNTER — Ambulatory Visit (INDEPENDENT_AMBULATORY_CARE_PROVIDER_SITE_OTHER): Payer: Medicare Other | Admitting: Internal Medicine

## 2014-01-14 ENCOUNTER — Encounter: Payer: Self-pay | Admitting: Internal Medicine

## 2014-01-14 VITALS — BP 129/85 | HR 69 | Temp 98.1°F | Wt 241.0 lb

## 2014-01-14 DIAGNOSIS — R198 Other specified symptoms and signs involving the digestive system and abdomen: Secondary | ICD-10-CM

## 2014-01-14 DIAGNOSIS — B20 Human immunodeficiency virus [HIV] disease: Secondary | ICD-10-CM

## 2014-01-14 DIAGNOSIS — B1920 Unspecified viral hepatitis C without hepatic coma: Secondary | ICD-10-CM

## 2014-01-14 DIAGNOSIS — N529 Male erectile dysfunction, unspecified: Secondary | ICD-10-CM | POA: Diagnosis not present

## 2014-01-14 DIAGNOSIS — R1900 Intra-abdominal and pelvic swelling, mass and lump, unspecified site: Secondary | ICD-10-CM | POA: Diagnosis not present

## 2014-01-14 NOTE — Progress Notes (Signed)
Subjective:    Patient ID: Bryan Gilmore, male    DOB: June 06, 1951, 63 y.o.   MRN: [id number]  HPI 63yo M with HIV-HCv ESRD on HD, CD 4 count of 360/VL<20, on RLG/DRVr/lamiduvidine/zidovudine. Doing well. Referred to hepatology, but appt still pending. He had abd u/s on April 1st that did not show any ascites.he has had 10 lb weight gain since last visit in early march. Patient states that he is noticing some discomfort to his abd due to increase girth. Does not appear that his dry weight has changed per his report.  He also reports having impotence ever since hospital admission for fournier's gangrene. He is wondering if there is anything that can be done about it.    Current Outpatient Prescriptions on File Prior to Visit  Medication Sig Dispense Refill  . allopurinol (ZYLOPRIM) 300 MG tablet TAKE ONE TABLET BY MOUTH ONCE DAILY FOR GOUT  30 tablet  0  . calcium acetate (PHOSLO) 667 MG capsule Take 667-1,334 mg by mouth 6 (six) times daily. Take 2 capsules by mouth 3 times daily with meals and 1 capsule by mouth 3 times daily with snacks.      . Darunavir Ethanolate (PREZISTA) 800 MG tablet Take 1 tablet (800 mg total) by mouth daily with breakfast.  30 tablet  11  . gabapentin (NEURONTIN) 100 MG capsule Take 1 capsule (100 mg total) by mouth 3 (three) times daily.  90 capsule  5  . lamivudine (EPIVIR) 100 MG tablet Take 0.5 tablets (50 mg total) by mouth daily.  30 tablet  6  . raltegravir (ISENTRESS) 400 MG tablet Take 1 tablet (400 mg total) by mouth 2 (two) times daily.  60 tablet  11  . ritonavir (NORVIR) 100 MG TABS Take 1 tablet (100 mg total) by mouth daily.  30 tablet  11  . zidovudine (RETROVIR) 300 MG tablet Take 1 tablet (300 mg total) by mouth daily.  30 tablet  11  . doxycycline (VIBRA-TABS) 100 MG tablet Take 1 tablet (100 mg total) by mouth 2 (two) times daily.  20 tablet  0  . Hydrocortisone (GERHARDT'S BUTT CREAM) CREA twice daily As needed to affected area  1 each  3  .  saccharomyces boulardii (FLORASTOR) 250 MG capsule Take 1 capsule (250 mg total) by mouth 2 (two) times daily.  30 capsule  0   No current facility-administered medications on file prior to visit.   Active Ambulatory Problems    Diagnosis Date Noted  . HIV INFECTION 09/15/2010  . HEPATITIS C 10/07/2010  . GOUT, CHRONIC 10/07/2010  . 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 Normocytic anemia 02/17/2012  . ESRD (end stage renal disease) 02/29/2012  . Anemia of chronic renal failure 02/29/2012  . Hyperkalemia 03/29/2012  . Secondary hyperparathyroidism (of renal origin) 03/29/2012  . Fever 04/01/2012  . Delirium 04/01/2012  . Atrial fibrillation, permanent 04/04/2012  . Alcohol abuse   . Metabolic encephalopathy 14/43/1540  . Fournier's gangrene in male 06/05/2012  . End stage renal disease 10/30/2012  . Aftercare following surgery of the circulatory system, [place] 12/18/2012  . Colostomy status 12/26/2012  . Other complications due to renal dialysis device, implant, and graft 01/29/2013  . Gynecomastia 11/12/2013   Resolved Ambulatory Problems    Diagnosis Date Noted  . Decreased libido 10/07/2010  . Sepsis secondary to suspected Fornier's gangrene 02/17/2012  . ARF (acute renal failure) in the setting of stage III CKD 02/17/2012  . Metabolic acidosis 08/67/6195  .  Hyperkalemia 02/17/2012  . Hyponatremia 02/17/2012  . Acute respiratory failure 02/17/2012   Past Medical History  Diagnosis Date  . HIV (human immunodeficiency virus infection)   . Gout   . Hepatitis C   . Fournier's gangrene   . Anemia   . Severe protein-calorie malnutrition   . Dysrhythmia   . Hypertension   . Sebaceous cyst   . Cough   . Unspecified constipation   . Gout, unspecified   . GERD (gastroesophageal reflux disease)   . Atrial fibrillation   . Ulcer of lower limb, unspecified   . Ulcer of lower limb, unspecified   . Other severe protein-calorie malnutrition   . Disorders of phosphorus  metabolism   . Hypopotassemia   . Anemia in chronic kidney disease(285.21)   . Anemia in chronic kidney disease(285.21)   . Anemia, unspecified   . Hepatitis, unspecified   . Altered mental status   . Hemoptysis, unspecified   . Gangrene   . Peripheral vascular disease, unspecified   . Arthritis        Review of Systems + abdominal girth, impotence, otherwise 10 point ros is negative    Objective:   Physical Exam BP 129/85  Pulse 69  Temp(Src) 98.1 F (36.7 C) (Oral)  Wt 241 lb (109.317 kg) Physical Exam  Constitutional: He is oriented to person, place, and time. He appears well-developed and well-nourished. No distress.  HENT:  Mouth/Throat: Oropharynx is clear and moist. No oropharyngeal exudate.  Cardiovascular: Normal rate, regular rhythm and normal heart sounds. Exam reveals no gallop and no friction rub.  No murmur heard.  Pulmonary/Chest: Effort normal and breath sounds normal. No respiratory distress. He has no wheezes.  Abdominal: Soft. Bowel sounds are normal. Protuberant abdomen. Prior surgical scar. Mild assymetry with left paraumbilical area more noticeable Lymphadenopathy:  He has no cervical adenopathy.  Neurological: He is alert and oriented to person, place, and time.  Skin: Skin is warm and dry. No rash noted. No erythema.  Psychiatric: He has a normal mood and affect. His behavior is normal.       Assessment & Plan:  hiv = well controlled, continue on current regimen  Abdominal girth = may need to repeat u/s. But will see when he is upcoming appt on hepatology schedule. He may have early signs of abd hernia. For now we will watch until next appt  Impotence = likely sustained nerve damage from fournier's gangrene and debridement. Will see what alternatives can hlep him. Vs. Impotence from liver disease.   hcv = also being referred to hepatology

## 2014-01-27 IMAGING — RF DG BE THRU COLOSTOMY
19 of 24 series · 19 of 24 positions shown · non-contrast
Comparison: CT 02/17/2012

CLINICAL DATA: Diverting colostomy before pelvic infection.  Pre
operative evaluation

BARIUM ENEMA THROUGH COLOSTOMY
Fluoroscopy Time: 4 minutes-91 seconds

[Series 1: run · 1 of 1 slices shown (1 of 19)]
[im 1/1]
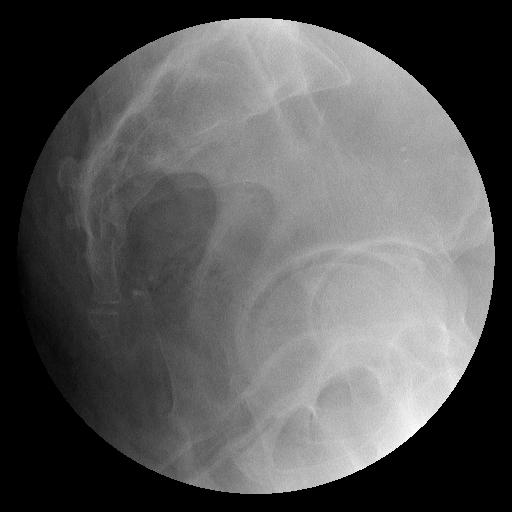

[Series 2: run · 1 of 1 slices shown (2 of 19)]
[im 1/1]
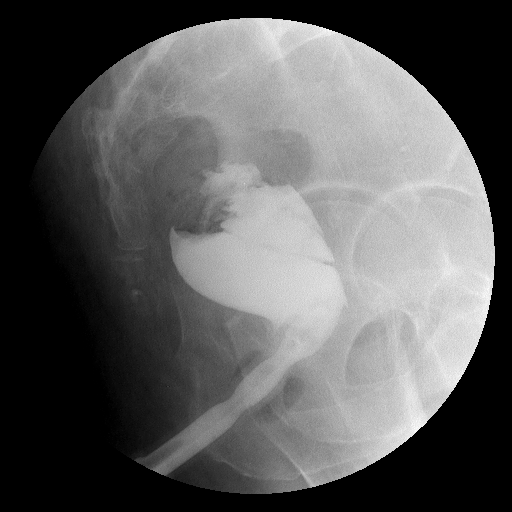

[Series 4: run · 1 of 1 slices shown (3 of 19)]
[im 1/1]
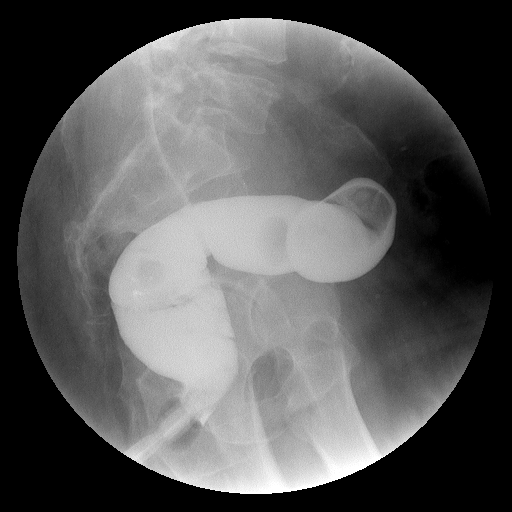

[Series 5: run · 1 of 1 slices shown (4 of 19)]
[im 1/1]
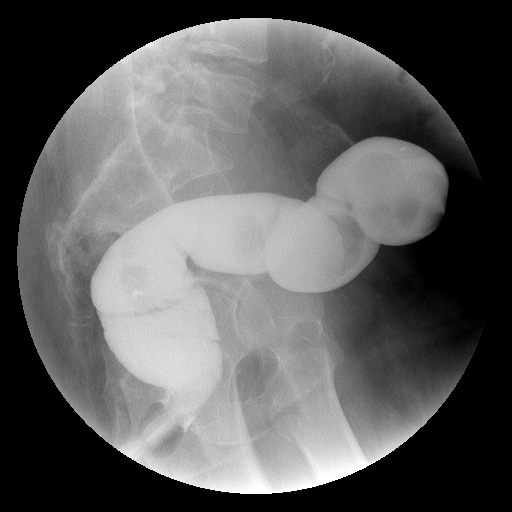

[Series 6: run · 1 of 1 slices shown (5 of 19)]
[im 1/1]
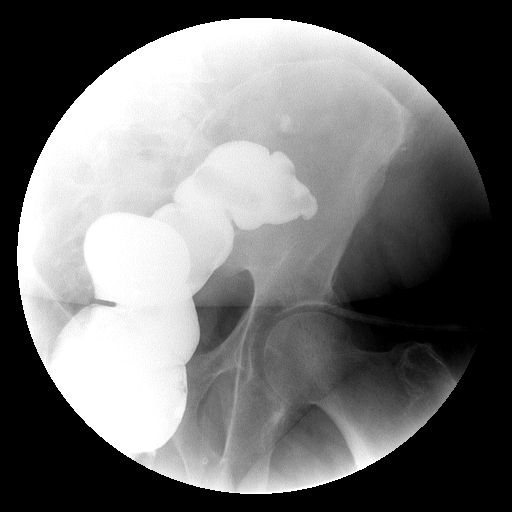

[Series 7: run · 1 of 1 slices shown (6 of 19)]
[im 1/1]
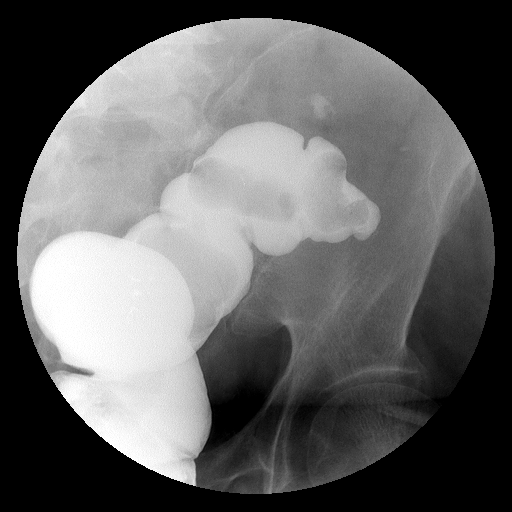

[Series 9: run · 1 of 1 slices shown (7 of 19)]
[im 1/1]
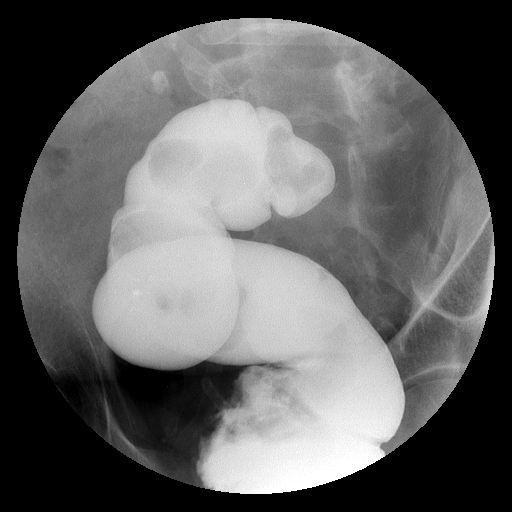

[Series 10: run · 1 of 1 slices shown (8 of 19)]
[im 1/1]
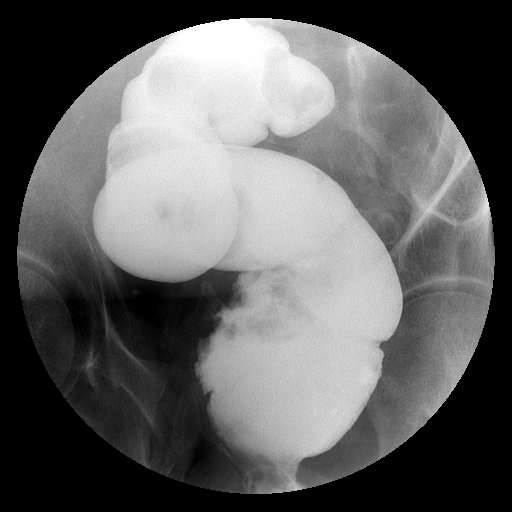

[Series 11: run · 1 of 1 slices shown (9 of 19)]
[im 1/1]
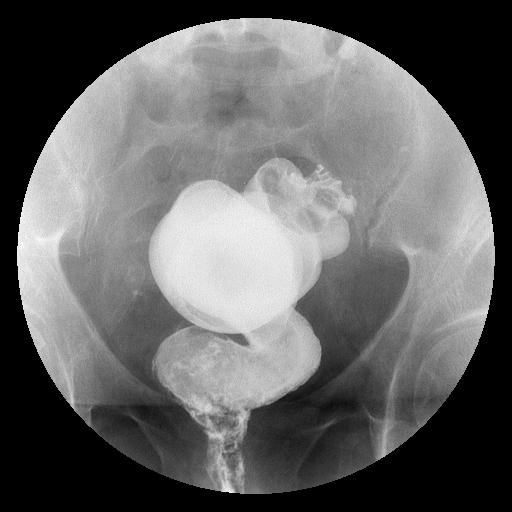

[Series 13: run · 1 of 1 slices shown (10 of 19)]
[im 1/1]
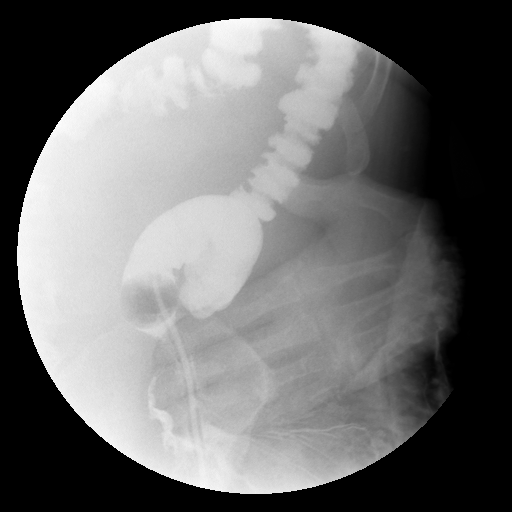

[Series 14: run · 1 of 1 slices shown (11 of 19)]
[im 1/1]
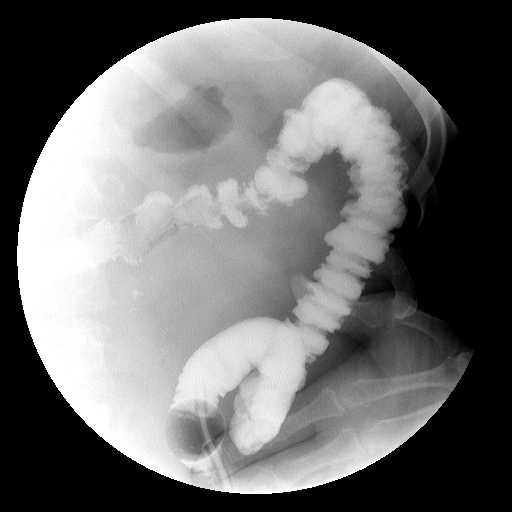

[Series 15: run · 1 of 1 slices shown (12 of 19)]
[im 1/1]
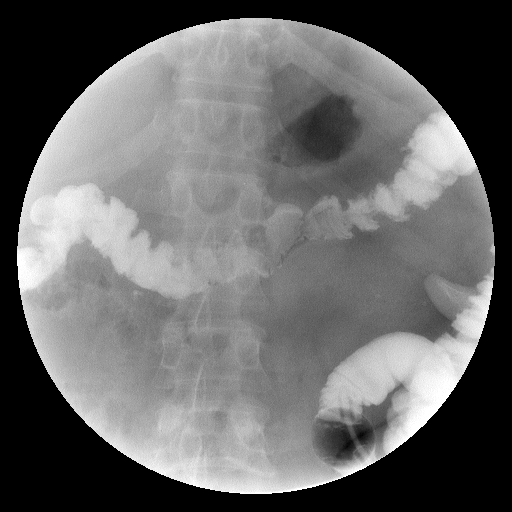

[Series 16: run · 1 of 1 slices shown (13 of 19)]
[im 1/1]
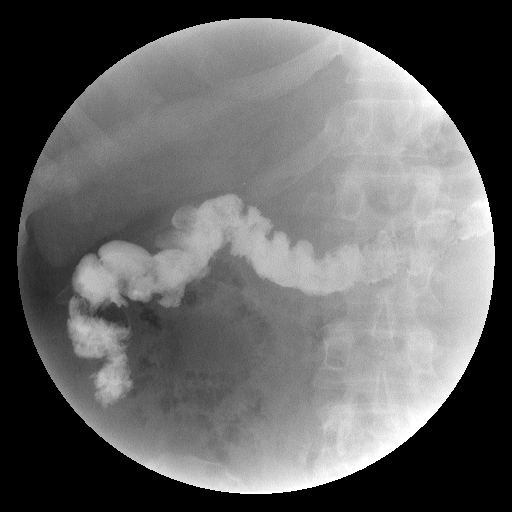

[Series 18: run · 1 of 1 slices shown (14 of 19)]
[im 1/1]
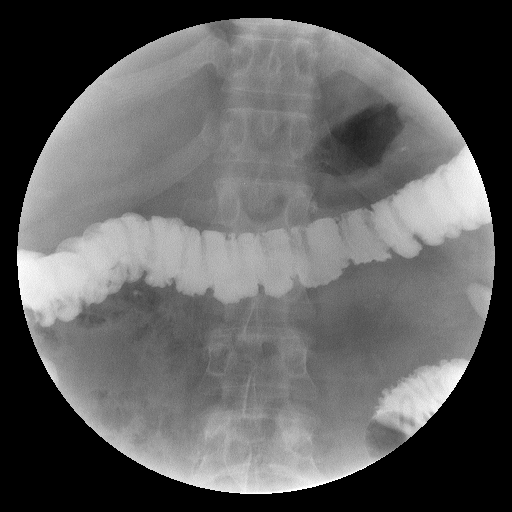

[Series 19: run · 1 of 1 slices shown (15 of 19)]
[im 1/1]
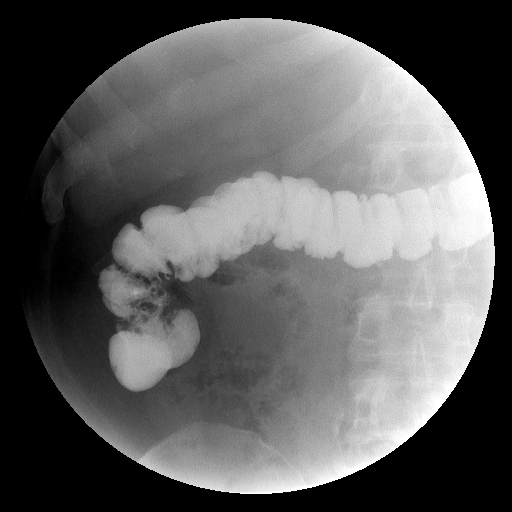

[Series 20: run · 1 of 1 slices shown (16 of 19)]
[im 1/1]
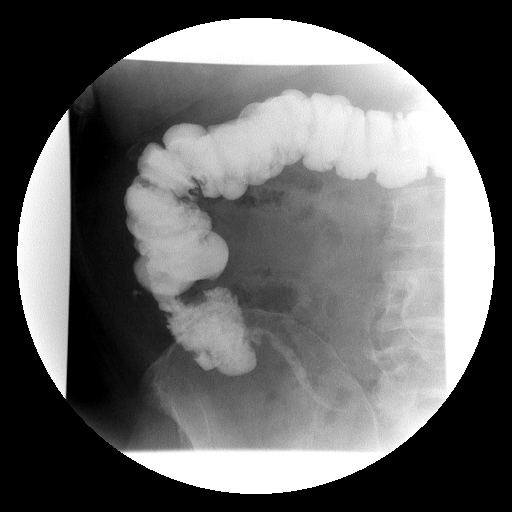

[Series 21: run · 1 of 1 slices shown (17 of 19)]
[im 1/1]
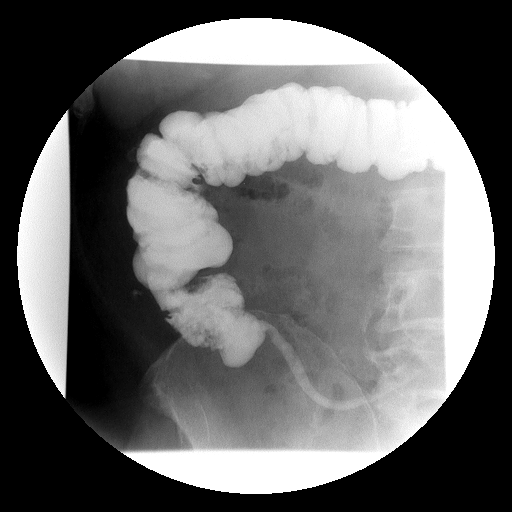

[Series 23: run · 1 of 1 slices shown (18 of 19)]
[im 1/1]
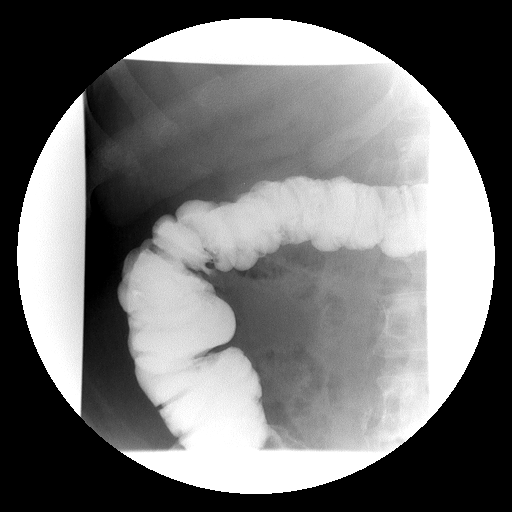

[Series 24: run · 1 of 1 slices shown (19 of 19)]
[im 1/1]
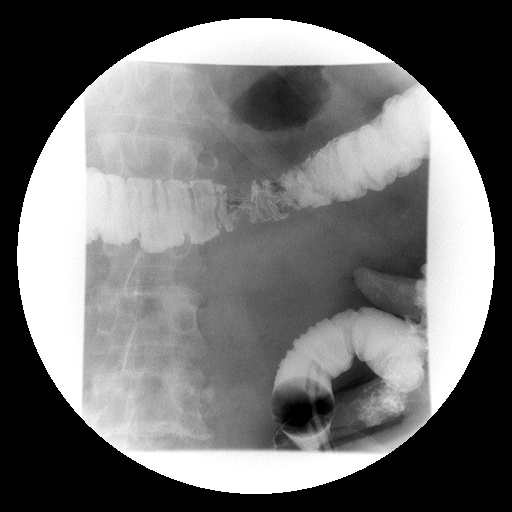

[19 of 24 positions shown; findings below may reference images not displayed]

FINDINGS: A enema tip was inserted in the rectum and under
fluoroscopic observation the retention balloon was inflated with
air.  Single contrast barium was administered via gravity feed.
The rectum and sigmoid colon filled to the limits of the pouch.  No
evidence of obstruction or mass.

Next attention was directed to the ostomy.  A Foley catheter was
inserted into the ostomy and the retention balloon inflated.
Single contrast barium was injected by gravity feed.  The colon
filled retrograde.  There is no evidence of mass or stricture in
the descending colon, transverse colon or descending colon.   The
cecum filled and contrast refluxed into the appendix.
IMPRESSION: 1..  Barium enema per rectum demonstrates a normal rectosigmoid
colon and sigmoid pouch.
2.  Barium enema through ostomy demonstrates normal ascending,
transverse, and descending colon.

## 2014-02-03 ENCOUNTER — Telehealth: Payer: Self-pay | Admitting: Licensed Clinical Social Worker

## 2014-02-03 NOTE — Telephone Encounter (Signed)
PA from [facility] called  Stating  that the patient is complaining of his abdominal girth worsening and uncomfortable. Patient would like to be seen. Please advise   Patient has an appointment on 7/6

## 2014-02-04 ENCOUNTER — Telehealth: Payer: Self-pay

## 2014-02-04 NOTE — Telephone Encounter (Signed)
Patient called with complaint he was told to call Dr Baxter Flattery by the PA at the dialysis center.  Patient states she is waiting on referral from Dr Baxter Flattery regarding increased abdominal girth. He states this has been happening for a quite a while.  I do not see a referral and not really sure what the patient may be referring to.  When I mentioned what was in the notes the patient stated that was not what he was "talking about" and wanted not sent to Dr Baxter Flattery who is aware of his condition.   I will forward note.   Laverle Patter, RN

## 2014-02-05 NOTE — Telephone Encounter (Signed)
i think he has a referral to gi for evaluation of cirrhosis

## 2014-02-05 NOTE — Telephone Encounter (Signed)
Can you check with travis, i think we referred him to gi for management of cirrhosis but have not heard if he has appointment

## 2014-02-06 NOTE — Telephone Encounter (Signed)
Called [facility] about this patient and was advised they will call the patient and try to get him scheduled for a visit asap

## 2014-02-27 DIAGNOSIS — R310 Gross hematuria: Secondary | ICD-10-CM | POA: Diagnosis not present

## 2014-02-27 DIAGNOSIS — N529 Male erectile dysfunction, unspecified: Secondary | ICD-10-CM | POA: Diagnosis not present

## 2014-02-27 DIAGNOSIS — N35919 Unspecified urethral stricture, male, unspecified site: Secondary | ICD-10-CM | POA: Diagnosis not present

## 2014-03-02 DIAGNOSIS — N35919 Unspecified urethral stricture, male, unspecified site: Secondary | ICD-10-CM | POA: Diagnosis not present

## 2014-03-16 ENCOUNTER — Ambulatory Visit: Payer: BC Managed Care – PPO | Admitting: Internal Medicine

## 2014-03-16 ENCOUNTER — Other Ambulatory Visit: Payer: Medicare Other

## 2014-03-16 DIAGNOSIS — B20 Human immunodeficiency virus [HIV] disease: Secondary | ICD-10-CM

## 2014-03-16 DIAGNOSIS — M1A00X0 Idiopathic chronic gout, unspecified site, without tophus (tophi): Secondary | ICD-10-CM | POA: Diagnosis not present

## 2014-03-17 LAB — LIPID PANEL
CHOL/HDL RATIO: 3 ratio (ref 0.0–5.0)
Cholesterol, Total: 134 mg/dL (ref 100–199)
HDL: 44 mg/dL (ref >39)
LDL Calculated: 67 mg/dL (ref 0–99)
TRIGLYCERIDES: 116 mg/dL (ref 0–149)
VLDL Cholesterol Cal: 23 mg/dL (ref 5–40)

## 2014-03-17 LAB — URIC ACID: Uric Acid: 5 mg/dL (ref 3.7–8.6)

## 2014-03-18 ENCOUNTER — Other Ambulatory Visit (HOSPITAL_COMMUNITY): Payer: Self-pay | Admitting: Gastroenterology

## 2014-03-18 DIAGNOSIS — Z1211 Encounter for screening for malignant neoplasm of colon: Secondary | ICD-10-CM | POA: Diagnosis not present

## 2014-03-18 DIAGNOSIS — R142 Eructation: Principal | ICD-10-CM

## 2014-03-18 DIAGNOSIS — R141 Gas pain: Secondary | ICD-10-CM | POA: Diagnosis not present

## 2014-03-18 DIAGNOSIS — R143 Flatulence: Principal | ICD-10-CM

## 2014-03-18 DIAGNOSIS — B1920 Unspecified viral hepatitis C without hepatic coma: Secondary | ICD-10-CM | POA: Diagnosis not present

## 2014-03-18 LAB — PROTIME-INR

## 2014-03-25 ENCOUNTER — Encounter: Payer: Self-pay | Admitting: Internal Medicine

## 2014-03-25 ENCOUNTER — Ambulatory Visit (INDEPENDENT_AMBULATORY_CARE_PROVIDER_SITE_OTHER): Payer: Medicare Other | Admitting: Internal Medicine

## 2014-03-25 ENCOUNTER — Ambulatory Visit (HOSPITAL_COMMUNITY)
Admission: RE | Admit: 2014-03-25 | Discharge: 2014-03-25 | Disposition: A | Discharge status: Home / Self Care | Payer: BC Managed Care – PPO | Source: Ambulatory Visit | Attending: Gastroenterology | Admitting: Gastroenterology

## 2014-03-25 VITALS — BP 153/83 | HR 80 | Temp 97.5°F | Wt 241.0 lb

## 2014-03-25 DIAGNOSIS — B1920 Unspecified viral hepatitis C without hepatic coma: Secondary | ICD-10-CM | POA: Insufficient documentation

## 2014-03-25 DIAGNOSIS — R142 Eructation: Secondary | ICD-10-CM

## 2014-03-25 DIAGNOSIS — B20 Human immunodeficiency virus [HIV] disease: Secondary | ICD-10-CM

## 2014-03-25 DIAGNOSIS — Z992 Dependence on renal dialysis: Secondary | ICD-10-CM | POA: Diagnosis not present

## 2014-03-25 DIAGNOSIS — R141 Gas pain: Secondary | ICD-10-CM

## 2014-03-25 DIAGNOSIS — Z21 Asymptomatic human immunodeficiency virus [HIV] infection status: Secondary | ICD-10-CM | POA: Insufficient documentation

## 2014-03-25 DIAGNOSIS — N186 End stage renal disease: Secondary | ICD-10-CM | POA: Insufficient documentation

## 2014-03-25 DIAGNOSIS — R143 Flatulence: Secondary | ICD-10-CM

## 2014-03-25 LAB — CBC WITH DIFFERENTIAL/PLATELET
Basophils Absolute: 0 10*3/uL (ref 0.0–0.1)
Basophils Relative: 0 % (ref 0–1)
EOS ABS: 0 10*3/uL (ref 0.0–0.7)
Eosinophils Relative: 1 % (ref 0–5)
HCT: 32.7 % — ABNORMAL LOW (ref 39.0–52.0)
Hemoglobin: 11.1 g/dL — ABNORMAL LOW (ref 13.0–17.0)
LYMPHS ABS: 1.5 10*3/uL (ref 0.7–4.0)
Lymphocytes Relative: 33 % (ref 12–46)
MCH: 35.9 pg — ABNORMAL HIGH (ref 26.0–34.0)
MCHC: 33.9 g/dL (ref 30.0–36.0)
MCV: 105.8 fL — ABNORMAL HIGH (ref 78.0–100.0)
Monocytes Absolute: 0.5 10*3/uL (ref 0.1–1.0)
Monocytes Relative: 11 % (ref 3–12)
NEUTROS PCT: 55 % (ref 43–77)
Neutro Abs: 2.4 10*3/uL (ref 1.7–7.7)
Platelets: 209 10*3/uL (ref 150–400)
RBC: 3.09 MIL/uL — AB (ref 4.22–5.81)
RDW: 14.8 % (ref 11.5–15.5)
WBC: 4.4 10*3/uL (ref 4.0–10.5)

## 2014-03-25 LAB — COMPLETE METABOLIC PANEL WITH GFR
ALT: 24 U/L (ref 0–53)
AST: 20 U/L (ref 0–37)
Albumin: 4.1 g/dL (ref 3.5–5.2)
Alkaline Phosphatase: 74 U/L (ref 39–117)
BILIRUBIN TOTAL: 0.8 mg/dL (ref 0.2–1.2)
BUN: 41 mg/dL — ABNORMAL HIGH (ref 6–23)
CO2: 26 meq/L (ref 19–32)
Calcium: 9.6 mg/dL (ref 8.4–10.5)
Chloride: 100 mEq/L (ref 96–112)
Creat: 9.33 mg/dL — ABNORMAL HIGH (ref 0.50–1.35)
GFR, EST AFRICAN AMERICAN: 6 mL/min — AB
GFR, Est Non African American: 5 mL/min — ABNORMAL LOW
GLUCOSE: 94 mg/dL (ref 70–99)
POTASSIUM: 4 meq/L (ref 3.5–5.3)
Sodium: 140 mEq/L (ref 135–145)
TOTAL PROTEIN: 7.3 g/dL (ref 6.0–8.3)

## 2014-03-26 ENCOUNTER — Ambulatory Visit (HOSPITAL_COMMUNITY): Payer: BC Managed Care – PPO

## 2014-03-26 ENCOUNTER — Telehealth: Payer: Self-pay | Admitting: *Deleted

## 2014-03-26 ENCOUNTER — Encounter: Payer: BC Managed Care – PPO | Admitting: Nurse Practitioner

## 2014-03-26 LAB — T-HELPER CELL (CD4) - (RCID CLINIC ONLY)
CD4 % Helper T Cell: 30 % — ABNORMAL LOW (ref 33–55)
CD4 T Cell Abs: 510 /uL (ref 400–2700)

## 2014-03-26 LAB — HIV-1 RNA QUANT-NO REFLEX-BLD
HIV 1 RNA Quant: <20 copies/mL (ref <20)
HIV-1 RNA Quant, Log: <1.3 {Log} (ref <1.30)

## 2014-03-26 NOTE — Telephone Encounter (Addendum)
Call from [facility] lab on alert creatinine of 9.33. Patient is ESRD; Dr. Baxter Flattery notified and she is aware. Myrtis Hopping

## 2014-04-05 NOTE — Progress Notes (Signed)
Subjective:    Patient ID: Bryan Gilmore, male    DOB: [birth year], 63 y.o.   MRN: [id number]  HPI 63yo M with HIV-HCV genotype 1b c/b cirrhosis, CD 4 count of 510/VL<20, currently on RLG/DRVr/lamivuidine/zidovudine for HIV disease. He had elastography done today for paperwork to get started on treatment for HEp C. He was initially referred to [facility] [facility] since we last saw him, but unclear why they have not scheduled appt. Still feels he has abdominal girth from cirrhosis  Current Outpatient Prescriptions on File Prior to Visit  Medication Sig Dispense Refill  . allopurinol (ZYLOPRIM) 300 MG tablet TAKE ONE TABLET BY MOUTH ONCE DAILY FOR GOUT  30 tablet  0  . calcium acetate (PHOSLO) 667 MG capsule Take 667-1,334 mg by mouth 6 (six) times daily. Take 2 capsules by mouth 3 times daily with meals and 1 capsule by mouth 3 times daily with snacks.      . Darunavir Ethanolate (PREZISTA) 800 MG tablet Take 1 tablet (800 mg total) by mouth daily with breakfast.  30 tablet  11  . gabapentin (NEURONTIN) 100 MG capsule Take 1 capsule (100 mg total) by mouth 3 (three) times daily.  90 capsule  5  . lamivudine (EPIVIR) 100 MG tablet Take 0.5 tablets (50 mg total) by mouth daily.  30 tablet  6  . raltegravir (ISENTRESS) 400 MG tablet Take 1 tablet (400 mg total) by mouth 2 (two) times daily.  60 tablet  11  . ritonavir (NORVIR) 100 MG TABS Take 1 tablet (100 mg total) by mouth daily.  30 tablet  11  . zidovudine (RETROVIR) 300 MG tablet Take 1 tablet (300 mg total) by mouth daily.  30 tablet  11   No current facility-administered medications on file prior to visit.      Review of Systems     Objective:   Physical Exam BP 153/83  Pulse 80  Temp(Src) 97.5 F (36.4 C) (Oral)  Wt 241 lb (109.317 kg) Physical Exam  Constitutional: He is oriented to person, place, and time. He appears well-developed and well-nourished. No distress.  HENT:  Mouth/Throat: Oropharynx is clear and moist. No  oropharyngeal exudate.  Cardiovascular: Normal rate, regular rhythm and normal heart sounds. Exam reveals no gallop and no friction rub.  No murmur heard.  Pulmonary/Chest: Effort normal and breath sounds normal. No respiratory distress. He has no wheezes.  Abdominal: Soft. Bowel sounds are normal. He exhibits no distension. There is no tenderness. Protuberant abdomen, has prior surgical scar from colostomy take down. Asymmetric ? hernia Lymphadenopathy:  He has no cervical adenopathy.  Neurological: He is alert and oriented to person, place, and time.  Skin: Skin is warm and dry. No rash noted. No erythema.  Psychiatric: He has a normal mood and affect. His behavior is normal.     Labs: Lab Results  Component Value Date   CD4TCELL 30* 03/25/2014   CD4TABS 510 03/25/2014   Lab Results  Component Value Date   HIV1RNAQUANT <20 03/25/2014   Hepatitis C RNA quantitative Latest Ref Rng 12/24/2013 02/05/2013 10/07/2010  HCV Quantitative <15 IU/mL 3031346(H) 7026378(H) 4770000(H)  HCV Quantitative Log <1.18 log 10 6.48(H) 6.87(H) -   Lab Results  Component Value Date   HCVGENOTYPE 1b 10/07/2010       Assessment & Plan:  hiv = continue on current regimen for now, may need to change his hiv regimen when he start HCV treatment. Will check labs today  hcv = await  read on elastrography, likley has advanced disease. We will submit for harvoni but see what his insurance will permit.  Abdominal girth/ascites = refer to [facility] for evaluation of portal hypertension with EGD.

## 2014-04-06 ENCOUNTER — Ambulatory Visit (INDEPENDENT_AMBULATORY_CARE_PROVIDER_SITE_OTHER): Payer: Medicare Other | Admitting: Internal Medicine

## 2014-04-06 ENCOUNTER — Encounter: Payer: Self-pay | Admitting: Internal Medicine

## 2014-04-06 ENCOUNTER — Ambulatory Visit: Payer: BC Managed Care – PPO | Admitting: Internal Medicine

## 2014-04-06 VITALS — BP 147/86 | HR 75 | Temp 98.2°F | Wt 229.5 lb

## 2014-04-06 DIAGNOSIS — K7460 Unspecified cirrhosis of liver: Secondary | ICD-10-CM

## 2014-04-06 DIAGNOSIS — B20 Human immunodeficiency virus [HIV] disease: Secondary | ICD-10-CM

## 2014-04-06 DIAGNOSIS — B182 Chronic viral hepatitis C: Secondary | ICD-10-CM | POA: Diagnosis not present

## 2014-04-06 DIAGNOSIS — R197 Diarrhea, unspecified: Secondary | ICD-10-CM | POA: Diagnosis not present

## 2014-04-06 NOTE — Progress Notes (Signed)
Subjective:    Patient ID: Bryan Gilmore, male    DOB: 11/02/1950, 63 y.o.   MRN: [id number]  HPI 63yo M with HIV-HCV c/b cirrhosis-ESRD on HD, well controlled hiv with cd 4 count of 510/VL<20, who was seen 2 wks ago but comes in for sick visit due to having diarrhea x 10 days. He has 4-5 BM of loose-watery stool. No abdominal cramping, no blood in stool. He denies any vomiting, but has decreased appetite. It has been 4-5 days since he had a large meal.stays hydrated. Diarrhea not associated with meals. He is concern about malabsorption  Current Outpatient Prescriptions on File Prior to Visit  Medication Sig Dispense Refill  . allopurinol (ZYLOPRIM) 300 MG tablet TAKE ONE TABLET BY MOUTH ONCE DAILY FOR GOUT  30 tablet  0  . calcium acetate (PHOSLO) 667 MG capsule Take 667-1,334 mg by mouth 6 (six) times daily. Take 2 capsules by mouth 3 times daily with meals and 1 capsule by mouth 3 times daily with snacks.      . Darunavir Ethanolate (PREZISTA) 800 MG tablet Take 1 tablet (800 mg total) by mouth daily with breakfast.  30 tablet  11  . gabapentin (NEURONTIN) 100 MG capsule Take 1 capsule (100 mg total) by mouth 3 (three) times daily.  90 capsule  5  . lamivudine (EPIVIR) 100 MG tablet Take 0.5 tablets (50 mg total) by mouth daily.  30 tablet  6  . raltegravir (ISENTRESS) 400 MG tablet Take 1 tablet (400 mg total) by mouth 2 (two) times daily.  60 tablet  11  . ritonavir (NORVIR) 100 MG TABS Take 1 tablet (100 mg total) by mouth daily.  30 tablet  11  . zidovudine (RETROVIR) 300 MG tablet Take 1 tablet (300 mg total) by mouth daily.  30 tablet  11   No current facility-administered medications on file prior to visit.      Review of Systems     Objective:   Physical Exam BP 147/86  Pulse 75  Temp(Src) 98.2 F (36.8 C) (Oral)  Wt 229 lb 8 oz (104.101 kg) Physical Exam  Constitutional: He is oriented to person, place, and time. He appears well-developed and well-nourished. No  distress.  HENT:  Mouth/Throat: Oropharynx is clear and moist. No oropharyngeal exudate.  Cardiovascular: Normal rate, regular rhythm and normal heart sounds. Exam reveals no gallop and no friction rub.  No murmur heard.  Pulmonary/Chest: Effort normal and breath sounds normal. No respiratory distress. He has no wheezes.  Abdominal: Soft. Bowel sounds are normal. He exhibits no distension. There is no tenderness.  Lymphadenopathy:  He has no cervical adenopathy.  Neurological: He is alert and oriented to person, place, and time.  Skin: Skin is warm and dry. No rash noted. No erythema.  Psychiatric: He has a normal mood and affect. His behavior is normal.    Lab Results  Component Value Date   CD4TCELL 30* 03/25/2014   CD4TABS 510 03/25/2014   Lab Results  Component Value Date   HIV1RNAQUANT <20 03/25/2014       Assessment & Plan:  hiv = well  Controlled, continue on current regimen for now. Anticipate needing to change once he starts treatment for hcv  hcv = with cirrhosis. We will reach out to Dr. Paulita Fujita to do egd and colonoscopy as part of his management of cirrhosis. Currently applying for harvoni. Awaiting for response to get approval to start therapy  Diarrhea= will get stool studies, cdiff, gia/crypto  to see if need to treat vs. Continue with supportive care

## 2014-04-08 ENCOUNTER — Other Ambulatory Visit: Payer: Self-pay | Admitting: *Deleted

## 2014-04-08 ENCOUNTER — Other Ambulatory Visit: Payer: Medicare Other

## 2014-04-08 DIAGNOSIS — R197 Diarrhea, unspecified: Secondary | ICD-10-CM

## 2014-04-08 NOTE — Addendum Note (Signed)
Addended by: Dolan Amen D on: 04/08/2014 11:56 AM   Modules accepted: Orders

## 2014-04-09 ENCOUNTER — Ambulatory Visit: Payer: BC Managed Care – PPO | Admitting: Internal Medicine

## 2014-04-09 ENCOUNTER — Other Ambulatory Visit: Payer: Self-pay | Admitting: Nephrology

## 2014-04-09 ENCOUNTER — Other Ambulatory Visit: Payer: Self-pay | Admitting: Internal Medicine

## 2014-04-09 DIAGNOSIS — A0472 Enterocolitis due to Clostridium difficile, not specified as recurrent: Secondary | ICD-10-CM

## 2014-04-09 DIAGNOSIS — R51 Headache: Principal | ICD-10-CM

## 2014-04-09 DIAGNOSIS — R519 Headache, unspecified: Secondary | ICD-10-CM

## 2014-04-09 LAB — CLOSTRIDIUM DIFFICILE BY PCR: Toxigenic C. Difficile by PCR: DETECTED — CR

## 2014-04-09 LAB — GIARDIA/CRYPTOSPORIDIUM (EIA)
Cryptosporidium Screen (EIA): NEGATIVE
GIARDIA SCREEN (EIA): NEGATIVE

## 2014-04-09 MED ORDER — METRONIDAZOLE 500 MG PO TABS: 500.0000 mg | ORAL_TABLET | Freq: Three times a day (TID) | ORAL | Status: DC

## 2014-04-09 NOTE — Progress Notes (Signed)
Stool studies show cdifficile positive. Will treat with metronidazole 500mg  TID x 10days

## 2014-04-12 LAB — STOOL CULTURE

## 2014-04-13 ENCOUNTER — Other Ambulatory Visit: Payer: BC Managed Care – PPO

## 2014-04-13 ENCOUNTER — Other Ambulatory Visit: Payer: Self-pay | Admitting: Nephrology

## 2014-04-13 ENCOUNTER — Ambulatory Visit
Admission: RE | Admit: 2014-04-13 | Discharge: 2014-04-13 | Disposition: A | Discharge status: Home / Self Care | Payer: BC Managed Care – PPO | Source: Ambulatory Visit | Attending: Nephrology | Admitting: Nephrology

## 2014-04-13 DIAGNOSIS — R519 Headache, unspecified: Secondary | ICD-10-CM

## 2014-04-13 DIAGNOSIS — R51 Headache: Principal | ICD-10-CM

## 2014-04-14 ENCOUNTER — Other Ambulatory Visit: Payer: Self-pay | Admitting: Internal Medicine

## 2014-04-15 ENCOUNTER — Telehealth: Payer: Self-pay | Admitting: *Deleted

## 2014-04-15 NOTE — Telephone Encounter (Signed)
Approval for Harvoni received.  Paperwork placed in Dr. Storm Frisk box in Triage for processing.  From Dr. Storm Frisk last note the pt's HIV medications may need changed.

## 2014-04-16 ENCOUNTER — Other Ambulatory Visit: Payer: Self-pay | Admitting: Internal Medicine

## 2014-04-16 DIAGNOSIS — B182 Chronic viral hepatitis C: Secondary | ICD-10-CM

## 2014-04-16 MED ORDER — LEDIPASVIR-SOFOSBUVIR 90-400 MG PO TABS: 1.0000 | ORAL_TABLET | Freq: Every day | ORAL | Status: DC

## 2014-04-16 NOTE — Progress Notes (Signed)
Patient has been approved for harvoni. We will meet with him next Friday to discuss how to take medication. i have asked him to wait to take this new medication since he is recovering from c.difficile colitis. He reports his diarrhea improved but still has loss of appetite.  In reviewing his hiv regimen appears that he can stay on his current regimen without any expected drug interaction with harvoni

## 2014-04-24 ENCOUNTER — Ambulatory Visit (INDEPENDENT_AMBULATORY_CARE_PROVIDER_SITE_OTHER): Payer: BC Managed Care – PPO | Admitting: Podiatrist

## 2014-04-24 ENCOUNTER — Encounter: Payer: Self-pay | Admitting: Podiatrist

## 2014-04-24 VITALS — BP 97/75 | HR 78 | Resp 18

## 2014-04-24 DIAGNOSIS — M79673 Pain in unspecified foot: Secondary | ICD-10-CM

## 2014-04-24 DIAGNOSIS — B351 Tinea unguium: Secondary | ICD-10-CM | POA: Diagnosis not present

## 2014-04-24 DIAGNOSIS — G589 Mononeuropathy, unspecified: Secondary | ICD-10-CM

## 2014-04-24 DIAGNOSIS — M79609 Pain in unspecified limb: Secondary | ICD-10-CM | POA: Diagnosis not present

## 2014-04-24 DIAGNOSIS — G629 Polyneuropathy, unspecified: Secondary | ICD-10-CM

## 2014-04-24 NOTE — Patient Instructions (Signed)
  HOME CARE INSTRUCTIONS  Wear shoes at all times, even in the house. Do not go barefoot. Bare feet are easily injured.  Check your feet daily for blisters, cuts, and redness. If you cannot see the bottom of your feet, use a mirror or ask someone for help.  Wash your feet with warm water (do not use hot water) and mild soap. Then pat your feet and the areas between your toes until they are completely dry. Do not soak your feet as this can dry your skin.  Apply a moisturizing lotion or petroleum jelly (that does not contain alcohol and is unscented) to the skin on your feet and to dry, brittle toenails. Do not apply lotion between your toes.  Trim your toenails straight across. Do not dig under them or around the cuticle. File the edges of your nails with an emery board or nail file.  Do not cut corns or calluses or try to remove them with medicine.  Wear clean socks or stockings every day. Make sure they are not too tight. Do not wear knee-high stockings since they may decrease blood flow to your legs.  Wear shoes that fit properly and have enough cushioning. To break in new shoes, wear them for just a few hours a day. This prevents you from injuring your feet. Always look in your shoes before you put them on to be sure there are no objects inside.  Do not cross your legs. This may decrease the blood flow to your feet.  If you find a minor scrape, cut, or break in the skin on your feet, keep it and the skin around it clean and dry. These areas may be cleansed with mild soap and water. Do not cleanse the area with peroxide, alcohol, or iodine.  When you remove an adhesive bandage, be sure not to damage the skin around it.  If you have a wound, look at it several times a day to make sure it is healing.  Do not use heating pads or hot water bottles. They may burn your skin. If you have lost feeling in your feet or legs, you may not know it is happening until it is too late.  Make sure your  health care provider performs a complete foot exam at least annually or more often if you have foot problems. Report any cuts, sores, or bruises to your health care provider immediately. SEEK MEDICAL CARE IF:   You have an injury that is not healing.  You have cuts or breaks in the skin.  You have an ingrown nail.  You notice redness on your legs or feet.  You feel burning or tingling in your legs or feet.  You have pain or cramps in your legs and feet.  Your legs or feet are numb.  Your feet always feel cold. SEEK IMMEDIATE MEDICAL CARE IF:   There is increasing redness, swelling, or pain in or around a wound.  There is a red line that goes up your leg.  Pus is coming from a wound.  You develop a fever or as directed by your health care provider.  You notice a bad smell coming from an ulcer or wound. Document Released: 08/25/2000 Document Revised: 04/30/2013 Document Reviewed: 02/04/2013 [employer] Patient Information 2015 [place], [place]. This information is not intended to replace advice given to you by your health care provider. Make sure you discuss any questions you have with your health care provider.

## 2014-04-24 NOTE — Progress Notes (Signed)
   Subjective:    Patient ID: Bryan Gilmore, male    DOB: [birth year], 63 y.o.   MRN: [id number]  HPI I AM A KIDNEY PATIENT AND GO TO DIALYSIS THREE TIMES A WEEK AND BURNS AND THROBS AND SORE AND TENDER AND NUMBNESS AND TINGLING AND NO SWELLING AND MY TOENAILS ARE LONG AND THICK    Review of Systems  Constitutional: Positive for appetite change, fatigue and unexpected weight change.  HENT: Positive for ear pain.   Eyes: Positive for visual disturbance.  Gastrointestinal: Positive for diarrhea.  Neurological: Positive for headaches.  All other systems reviewed and are negative.      Objective:   Physical Exam Patient is awake, alert, and oriented x 3.  In no acute distress.  Vascular status is intact with palpable pedal pulses at 1/4 DP and 2/4PT bilateral and capillary refill time within normal limits. Neurological sensation is alsodecreased bilaterally via Semmes Weinstein monofilament at 3/5 sites. Light touch, vibratory sensation, Achilles tendon reflex is intact. Dermatological exam reveals skin color, turger and texture as xerotic.  Patient's toenails are severely elongated, thickened, discolored, dystrophic and clinically mycotic bilateral. No open lesions present.  Musculature intact with dorsiflexion, plantarflexion, inversion, eversion.    Assessment & Plan:  Neuropathy, mycotic toenails  Plan: Careful debridement of the toenails was carried out today without complication. Discussed the neuropathy and he is are ready on gabapentin. Recommended he speak with his primary care physician regarding adjustment of the gabapentin to help more with the neuropathy. He will be seen back in 3 months or as needed for followup.

## 2014-05-04 ENCOUNTER — Ambulatory Visit (INDEPENDENT_AMBULATORY_CARE_PROVIDER_SITE_OTHER): Payer: BC Managed Care – PPO | Admitting: Internal Medicine

## 2014-05-04 ENCOUNTER — Encounter: Payer: Self-pay | Admitting: Internal Medicine

## 2014-05-04 VITALS — BP 137/87 | HR 85 | Temp 98.2°F | Wt 225.0 lb

## 2014-05-04 DIAGNOSIS — B182 Chronic viral hepatitis C: Secondary | ICD-10-CM | POA: Diagnosis not present

## 2014-05-04 DIAGNOSIS — A0472 Enterocolitis due to Clostridium difficile, not specified as recurrent: Secondary | ICD-10-CM | POA: Diagnosis not present

## 2014-05-04 DIAGNOSIS — B20 Human immunodeficiency virus [HIV] disease: Secondary | ICD-10-CM | POA: Diagnosis not present

## 2014-05-04 MED ORDER — VANCOMYCIN 50 MG/ML ORAL SOLUTION: 125.0000 mg | Freq: Four times a day (QID) | ORAL | Status: DC

## 2014-05-04 NOTE — Progress Notes (Signed)
Subjective:    Patient ID: Bryan Gilmore, male    DOB: [birth year], 63 y.o.   MRN: [id number]  HPI 63yo M with HIV-HCV and ESRD he is recently finished flagyl course for c.difficile. He has not had significant improvement with diarrhea. He still has 3-4 bm per day, watery stools usually after having a a meal. He has loss of appetite. Weight loss of 20# he reports over the last few months. No abdominal cramping or blood.  Current Outpatient Prescriptions on File Prior to Visit  Medication Sig Dispense Refill  . allopurinol (ZYLOPRIM) 300 MG tablet TAKE ONE TABLET BY MOUTH ONCE DAILY FOR GOUT  30 tablet  0  . Amino Acids (LIQUACEL PO) Take by mouth 3 (three) times a week. given to him at dialysis      . calcium acetate (PHOSLO) 667 MG capsule Take 667-1,334 mg by mouth 6 (six) times daily. Take 2 capsules by mouth 3 times daily with meals and 1 capsule by mouth 3 times daily with snacks.      . Darunavir Ethanolate (PREZISTA) 800 MG tablet Take 1 tablet (800 mg total) by mouth daily with breakfast.  30 tablet  11  . gabapentin (NEURONTIN) 100 MG capsule Take 1 capsule (100 mg total) by mouth 3 (three) times daily.  90 capsule  5  . ISENTRESS 400 MG tablet TAKE 1 TABLET BY MOUTH TWICE DAILY  60 tablet  0  . lamivudine (EPIVIR) 100 MG tablet TAKE 1/2 TABLET BY MOUTH DAILY  30 tablet  0  . metroNIDAZOLE (FLAGYL) 500 MG tablet Take 1 tablet (500 mg total) by mouth 3 (three) times daily.  30 tablet  0  . NORVIR 100 MG TABS tablet TAKE 1 TABLET BY MOUTH DAILY  30 tablet  0  . zidovudine (RETROVIR) 300 MG tablet TAKE 1 TABLET BY MOUTH DAILY  30 tablet  0  . Ledipasvir-Sofosbuvir (HARVONI) 90-400 MG TABS Take 1 tablet by mouth daily.  30 tablet  2   No current facility-administered medications on file prior to visit.   Active Ambulatory Problems    Diagnosis Date Noted  . HIV INFECTION 09/15/2010  . HEPATITIS C 10/07/2010  . GOUT, CHRONIC 10/07/2010  . 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  Normocytic anemia 02/17/2012  . ESRD (end stage renal disease) 02/29/2012  . Anemia of chronic renal failure 02/29/2012  . Hyperkalemia 03/29/2012  . Secondary hyperparathyroidism (of renal origin) 03/29/2012  . Fever 04/01/2012  . Delirium 04/01/2012  . Atrial fibrillation, permanent 04/04/2012  . Alcohol abuse   . Metabolic encephalopathy 42/59/5638  . Fournier's gangrene in male 06/05/2012  . End stage renal disease 10/30/2012  . Aftercare following surgery of the circulatory system, [place] 12/18/2012  . Colostomy status 12/26/2012  . Other complications due to renal dialysis device, implant, and graft 01/29/2013  . Gynecomastia 11/12/2013   Resolved Ambulatory Problems    Diagnosis Date Noted  . Decreased libido 10/07/2010  . Sepsis secondary to suspected Fornier's gangrene 02/17/2012  . ARF (acute renal failure) in the setting of stage III CKD 02/17/2012  . Metabolic acidosis 75/64/3329  . Hyperkalemia 02/17/2012  . Hyponatremia 02/17/2012  . Acute respiratory failure 02/17/2012   Past Medical History  Diagnosis Date  . HIV (human immunodeficiency virus infection)   . Gout   . Hepatitis C   . Fournier's gangrene   . Anemia   . Severe protein-calorie malnutrition   . Dysrhythmia   . Hypertension   . Sebaceous  cyst   . Cough   . Unspecified constipation   . Gout, unspecified   . GERD (gastroesophageal reflux disease)   . Atrial fibrillation   . Ulcer of lower limb, unspecified   . Ulcer of lower limb, unspecified   . Other severe protein-calorie malnutrition   . Disorders of phosphorus metabolism   . Hypopotassemia   . Anemia in chronic kidney disease(285.21)   . Anemia in chronic kidney disease(285.21)   . Anemia, unspecified   . Hepatitis, unspecified   . Altered mental status   . Hemoptysis, unspecified   . Gangrene   . Peripheral vascular disease, unspecified   . Arthritis        Review of Systems + diarrhea, decrease appetite, weight loss       Objective:   Physical Exam BP 137/87  Pulse 85  Temp(Src) 98.2 F (36.8 C) (Oral)  Wt 225 lb (102.059 kg) Physical Exam  Constitutional: He is oriented to person, place, and time. He appears well-developed and well-nourished. No distress.  HENT:  Mouth/Throat: Oropharynx is clear and moist. No oropharyngeal exudate.  Cardiovascular: Normal rate, regular rhythm and normal heart sounds. Exam reveals no gallop and no friction rub.  No murmur heard.  Pulmonary/Chest: Effort normal and breath sounds normal. No respiratory distress. He has no wheezes.  Abdominal: Soft. Bowel sounds are normal. He exhibits no distension. There is no tenderness.  Lymphadenopathy:  He has no cervical adenopathy.  Neurological: He is alert and oriented to person, place, and time.  Skin: Skin is warm and dry. No rash noted. No erythema.  Psychiatric: He has a normal mood and affect. His behavior is normal.          Assessment & Plan:  C.difficile colitis = failed metronidazole. Will do a course of oral vancomycin x 14 days to see if it improves.   hiv = well controlled. No need to change his art regimen because of harvoni  hcv = has been approved for harvoni. Has picked up first month, but has not started treatment. Wanted to clear up cdi first before initiation of treatment for hep c.  Weight loss/decrease appetite = likely secondary to cdi. Will continue to [facility]

## 2014-05-13 ENCOUNTER — Encounter: Payer: BC Managed Care – PPO | Admitting: Internal Medicine

## 2014-05-20 ENCOUNTER — Other Ambulatory Visit: Payer: Self-pay | Admitting: Internal Medicine

## 2014-05-20 ENCOUNTER — Other Ambulatory Visit: Payer: Self-pay | Admitting: *Deleted

## 2014-05-20 DIAGNOSIS — B20 Human immunodeficiency virus [HIV] disease: Secondary | ICD-10-CM

## 2014-05-20 MED ORDER — DARUNAVIR ETHANOLATE 800 MG PO TABS: 800.0000 mg | ORAL_TABLET | Freq: Every day | ORAL | Status: DC

## 2014-05-25 ENCOUNTER — Encounter: Payer: Self-pay | Admitting: Internal Medicine

## 2014-05-25 ENCOUNTER — Ambulatory Visit (INDEPENDENT_AMBULATORY_CARE_PROVIDER_SITE_OTHER): Payer: BC Managed Care – PPO | Admitting: Internal Medicine

## 2014-05-25 ENCOUNTER — Telehealth: Payer: Self-pay | Admitting: *Deleted

## 2014-05-25 VITALS — BP 179/95 | HR 70 | Temp 97.6°F | Ht 71.0 in | Wt 225.0 lb

## 2014-05-25 DIAGNOSIS — B182 Chronic viral hepatitis C: Secondary | ICD-10-CM | POA: Diagnosis not present

## 2014-05-25 DIAGNOSIS — R1900 Intra-abdominal and pelvic swelling, mass and lump, unspecified site: Secondary | ICD-10-CM | POA: Diagnosis not present

## 2014-05-25 DIAGNOSIS — Z23 Encounter for immunization: Secondary | ICD-10-CM

## 2014-05-25 DIAGNOSIS — A0472 Enterocolitis due to Clostridium difficile, not specified as recurrent: Secondary | ICD-10-CM | POA: Diagnosis not present

## 2014-05-25 DIAGNOSIS — R198 Other specified symptoms and signs involving the digestive system and abdomen: Secondary | ICD-10-CM

## 2014-05-25 DIAGNOSIS — B20 Human immunodeficiency virus [HIV] disease: Secondary | ICD-10-CM | POA: Diagnosis not present

## 2014-05-25 NOTE — Progress Notes (Signed)
Patient ID: Bryan Gilmore, male   DOB: 03-20-1951, 63 y.o.   MRN: [id number]       Patient ID: Bryan Gilmore, male   DOB: 1951/04/12, 63 y.o.   MRN: [id number]  HPI 63yo M with HIV-HCV, ESrD on HD. Recently teated fo cdifficile infeciton. Now impoved. He reports only having 1-2 loose stools per day. He feels that he is concerned that his abdomen is getting larger.  Outpatient Encounter Prescriptions as of 05/25/2014  Medication Sig  . allopurinol (ZYLOPRIM) 300 MG tablet TAKE ONE TABLET BY MOUTH ONCE DAILY FOR GOUT  . Darunavir Ethanolate (PREZISTA) 800 MG tablet Take 1 tablet (800 mg total) by mouth daily with breakfast.  . gabapentin (NEURONTIN) 100 MG capsule Take 1 capsule (100 mg total) by mouth 3 (three) times daily.  . ISENTRESS 400 MG tablet TAKE ONE TABLET BY MOUTH BY MOUTH TWICE DAILY  . lamivudine (EPIVIR) 100 MG tablet TAKE 1/2 TABLET BY MOUTH EVERY DAY  . NORVIR 100 MG TABS tablet TAKE 1 TABLET BY MOUTH EVERY DAY  . zidovudine (RETROVIR) 300 MG tablet TAKE ONE TABLET BY MOUTH EVERY DAY  . Amino Acids (LIQUACEL PO) Take by mouth 3 (three) times a week. given to him at dialysis  . calcium acetate (PHOSLO) 667 MG capsule Take 667-1,334 mg by mouth 6 (six) times daily. Take 2 capsules by mouth 3 times daily with meals and 1 capsule by mouth 3 times daily with snacks.  . Ledipasvir-Sofosbuvir (HARVONI) 90-400 MG TABS Take 1 tablet by mouth daily.  . metroNIDAZOLE (FLAGYL) 500 MG tablet Take 1 tablet (500 mg total) by mouth 3 (three) times daily.  . vancomycin (VANCOCIN) 50 mg/mL oral solution Take 2.5 mLs (125 mg total) by mouth every 6 (six) hours. X 14 days. Dispense quantity sufficient     Patient Active Problem List   Diagnosis Date Noted  . Gynecomastia 11/12/2013  . Other complications due to renal dialysis device, implant, and graft 01/29/2013  . Colostomy status 12/26/2012  . Aftercare following surgery of the circulatory system, [place] 12/18/2012  . End stage renal disease  10/30/2012  . Fournier's gangrene in male 06/05/2012  . Metabolic encephalopathy 84/16/6063  . Alcohol abuse   . Atrial fibrillation, permanent 04/04/2012  . Fever 04/01/2012  . Delirium 04/01/2012  . Hyperkalemia 03/29/2012  . Secondary hyperparathyroidism (of renal origin) 03/29/2012  . ESRD (end stage renal disease) 02/29/2012  . Anemia of chronic renal failure 02/29/2012  . Normocytic anemia 02/17/2012  . HEPATITIS C 10/07/2010  . GOUT, CHRONIC 10/07/2010  . 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 HIV INFECTION 09/15/2010     Health Maintenance Due  Topic Date Due  . Colonoscopy  11/18/2000  . Zostavax  11/19/2010  . Influenza Vaccine  04/11/2014     Review of Systems  Physical Exam   BP 179/95  Pulse 70  Temp(Src) 97.6 F (36.4 C) (Oral)  Ht 5\' 11"  (1.803 m)  Wt 225 lb (102.059 kg)  BMI 31.39 kg/m2 Physical Exam  Constitutional: He is oriented to person, place, and time. He appears well-developed and well-nourished. No distress.  HENT:  Mouth/Throat: Oropharynx is clear and moist. No oropharyngeal exudate.  Cardiovascular: Normal rate, regular rhythm and normal heart sounds. Exam reveals no gallop and no friction rub.  No murmur heard.  Pulmonary/Chest: Effort normal and breath sounds normal. No respiratory distress. He has no wheezes.  Abdominal: Soft. Bowel sounds are normal. He exhibits no distension. There is no tenderness. No  fluid shift. He does have asymmetry near surgical incision ? hernia Lymphadenopathy:  He has no cervical adenopathy.  Neurological: He is alert and oriented to person, place, and time.  Skin: Skin is warm and dry. No rash noted. No erythema.  Psychiatric: He has a normal mood and affect. His behavior is normal.    Lab Results  Component Value Date   CD4TCELL 30* 03/25/2014   Lab Results  Component Value Date   CD4TABS 510 03/25/2014   CD4TABS 360* 11/10/2013   CD4TABS 340* 08/27/2013   Lab Results  Component Value Date    HIV1RNAQUANT <20 03/25/2014   Lab Results  Component Value Date   HEPBSAB NEGATIVE 02/18/2012   No results found for this basename: RPR    CBC Lab Results  Component Value Date   WBC 4.4 03/25/2014   RBC 3.09* 03/25/2014   HGB 11.1* 03/25/2014   HCT 32.7* 03/25/2014   PLT 209 03/25/2014   MCV 105.8* 03/25/2014   MCH 35.9* 03/25/2014   MCHC 33.9 03/25/2014   RDW 14.8 03/25/2014   LYMPHSABS 1.5 03/25/2014   MONOABS 0.5 03/25/2014   EOSABS 0.0 03/25/2014   BASOSABS 0.0 03/25/2014   BMET Lab Results  Component Value Date   NA 140 03/25/2014   K 4.0 03/25/2014   CL 100 03/25/2014   CO2 26 03/25/2014   GLUCOSE 94 03/25/2014   BUN 41* 03/25/2014   CREATININE 9.33* 03/25/2014   CALCIUM 9.6 03/25/2014   GFRNONAA 5* 03/25/2014   GFRAA 6* 03/25/2014     Assessment and Plan   hcv = will stat havoni today. Get labs at 4 wk, 8 wks and 12 wks. Will have him see Dr. Paulita Gilmore as well for egd/csy/hcc surveillance  hiv = well contolled  cdifficile  Impoved. No need fo efills  abd girth = appeas like he may have abd wall henia. Will get abd ct, and refer to surgery

## 2014-05-25 NOTE — Telephone Encounter (Signed)
No record of recent imaging at [facility] or [facility]. Landis Gandy, RN

## 2014-06-03 DIAGNOSIS — R143 Flatulence: Secondary | ICD-10-CM | POA: Diagnosis not present

## 2014-06-03 DIAGNOSIS — K7460 Unspecified cirrhosis of liver: Secondary | ICD-10-CM | POA: Diagnosis not present

## 2014-06-03 DIAGNOSIS — R141 Gas pain: Secondary | ICD-10-CM | POA: Diagnosis not present

## 2014-06-03 DIAGNOSIS — R142 Eructation: Secondary | ICD-10-CM | POA: Diagnosis not present

## 2014-06-04 ENCOUNTER — Other Ambulatory Visit: Payer: Self-pay | Admitting: Gastroenterology

## 2014-06-04 DIAGNOSIS — R140 Abdominal distension (gaseous): Secondary | ICD-10-CM

## 2014-06-04 DIAGNOSIS — R143 Flatulence: Principal | ICD-10-CM

## 2014-06-04 DIAGNOSIS — R141 Gas pain: Secondary | ICD-10-CM

## 2014-06-04 DIAGNOSIS — R142 Eructation: Principal | ICD-10-CM

## 2014-06-09 ENCOUNTER — Other Ambulatory Visit: Payer: BC Managed Care – PPO

## 2014-06-10 ENCOUNTER — Ambulatory Visit
Admission: RE | Admit: 2014-06-10 | Discharge: 2014-06-10 | Disposition: A | Discharge status: Home / Self Care | Payer: BC Managed Care – PPO | Source: Ambulatory Visit | Attending: Gastroenterology | Admitting: Gastroenterology

## 2014-06-10 DIAGNOSIS — R141 Gas pain: Secondary | ICD-10-CM

## 2014-06-10 DIAGNOSIS — R142 Eructation: Principal | ICD-10-CM

## 2014-06-10 DIAGNOSIS — R140 Abdominal distension (gaseous): Secondary | ICD-10-CM

## 2014-06-10 DIAGNOSIS — R143 Flatulence: Principal | ICD-10-CM

## 2014-06-10 MED ORDER — IOHEXOL 300 MG/ML  SOLN
125.0000 mL | Freq: Once | INTRAMUSCULAR | Status: AC | PRN
  Administered 2014-06-10: 125 mL via INTRAVENOUS

## 2014-06-22 ENCOUNTER — Other Ambulatory Visit: Payer: Medicare Other

## 2014-06-22 DIAGNOSIS — B1920 Unspecified viral hepatitis C without hepatic coma: Secondary | ICD-10-CM

## 2014-06-23 LAB — HEPATITIS C RNA QUANTITATIVE
HCV Quantitative Log: <1.18 {Log} (ref <1.18)
HCV Quantitative: <15 IU/mL (ref <15)

## 2014-06-24 ENCOUNTER — Encounter: Payer: Self-pay | Admitting: Internal Medicine

## 2014-06-24 ENCOUNTER — Ambulatory Visit (INDEPENDENT_AMBULATORY_CARE_PROVIDER_SITE_OTHER): Payer: BC Managed Care – PPO | Admitting: Internal Medicine

## 2014-06-24 VITALS — BP 150/88 | HR 74 | Temp 96.9°F | Resp 20 | Ht 67.0 in | Wt 221.0 lb

## 2014-06-24 DIAGNOSIS — N184 Chronic kidney disease, stage 4 (severe): Secondary | ICD-10-CM

## 2014-06-24 DIAGNOSIS — B20 Human immunodeficiency virus [HIV] disease: Secondary | ICD-10-CM | POA: Diagnosis not present

## 2014-06-24 DIAGNOSIS — M1A00X0 Idiopathic chronic gout, unspecified site, without tophus (tophi): Secondary | ICD-10-CM | POA: Diagnosis not present

## 2014-06-24 DIAGNOSIS — H43393 Other vitreous opacities, bilateral: Secondary | ICD-10-CM | POA: Diagnosis not present

## 2014-06-24 DIAGNOSIS — I10 Essential (primary) hypertension: Secondary | ICD-10-CM

## 2014-06-24 DIAGNOSIS — Z0000 Encounter for general adult medical examination without abnormal findings: Secondary | ICD-10-CM | POA: Diagnosis not present

## 2014-06-24 DIAGNOSIS — M1A9XX0 Chronic gout, unspecified, without tophus (tophi): Secondary | ICD-10-CM | POA: Diagnosis not present

## 2014-06-24 DIAGNOSIS — D631 Anemia in chronic kidney disease: Secondary | ICD-10-CM

## 2014-06-24 DIAGNOSIS — N186 End stage renal disease: Secondary | ICD-10-CM

## 2014-06-24 DIAGNOSIS — K458 Other specified abdominal hernia without obstruction or gangrene: Secondary | ICD-10-CM | POA: Diagnosis not present

## 2014-06-24 DIAGNOSIS — K219 Gastro-esophageal reflux disease without esophagitis: Secondary | ICD-10-CM

## 2014-06-24 DIAGNOSIS — E0590 Thyrotoxicosis, unspecified without thyrotoxic crisis or storm: Secondary | ICD-10-CM | POA: Diagnosis not present

## 2014-06-24 DIAGNOSIS — I4891 Unspecified atrial fibrillation: Secondary | ICD-10-CM | POA: Insufficient documentation

## 2014-06-24 DIAGNOSIS — H43399 Other vitreous opacities, unspecified eye: Secondary | ICD-10-CM | POA: Insufficient documentation

## 2014-06-24 MED ORDER — ASPIRIN EC 325 MG PO TBEC: 325.0000 mg | DELAYED_RELEASE_TABLET | Freq: Every day | ORAL | Status: DC

## 2014-06-24 MED ORDER — CARVEDILOL 3.125 MG PO TABS: 3.1250 mg | ORAL_TABLET | Freq: Two times a day (BID) | ORAL | Status: DC

## 2014-06-24 MED ORDER — OMEPRAZOLE 20 MG PO CPDR: 20.0000 mg | DELAYED_RELEASE_CAPSULE | Freq: Every day | ORAL | Status: DC

## 2014-06-24 NOTE — Progress Notes (Signed)
Patient ID: Bryan Gilmore, male   DOB: 1950-11-04, 63 y.o.   MRN: [id number]    Chief Complaint  Patient presents with  . Annual Exam   No Known Allergies  HPI 63 y/o male patient is here for annual exam. I am seeing this patient for the first time. He has history of HIV, HCV, Afib, ESRD on dialysis 3 days a week, neuropathy, gout, secondary hyperparathyroidism. He has afib for long time but is not on any antiarrythmic agent, anticoagulation for unclear reason. He mentions that at one point he was on coumadin.  He sees ID and [facility] He also sees a podiatry for mycotic toenails He had c.diff infection recently and was treated for it. No further loose stool New heartburn that has been bothering him  Wt Readings from Last 3 Encounters:  06/24/14 221 lb (100.245 kg)  05/25/14 225 lb (102.059 kg)  05/04/14 225 lb (102.059 kg)    Review of Systems  Constitutional: Negative for fever, chills, malaise/fatigue and diaphoresis.  HENT: Negative for congestion, hearing loss and sore throat.  has some runny nose Eyes: Negative for blurred vision, double vision and discharge. has been having floaters, has not seen an eye doctor recently Respiratory: Negative for cough, sputum production, shortness of breath and wheezing.   Cardiovascular: Negative for chest pain, palpitations, orthopnea and leg swelling.  Gastrointestinal: Negative for nausea, vomiting, diarrhea and constipation. has abdominal hernia and is waiting to see surgery. Has abdominal discomfort when he coughs and sneezes, no pain otherwise. Has been having heartburn which is new for him. This bothers him Genitourinary: Negative for dysuria, urgency, frequency and flank pain. makes some urine. On HD 3 days a week Musculoskeletal: Negative for back pain, falls, joint pain   Skin: Negative for itching and rash.  Neurological: Negative for dizziness, tingling, focal weakness and headaches.  Psychiatric/Behavioral: Negative for depression  and memory loss. The patient is not nervous/anxious.  Past Medical History  Diagnosis Date  . HIV (human immunodeficiency virus infection)   . Sepsis(995.91)   . Gout   . Hepatitis C   . Fournier's gangrene   . Anemia   . Severe protein-calorie malnutrition   . Alcohol abuse   . Dysrhythmia     AFib, not a candidate for coumadin , dr [name] signed off on pt  . Hypertension   . Colostomy status   . Sebaceous cyst   . Cough   . Unspecified constipation   . Gout, unspecified   . GERD (gastroesophageal reflux disease)   . Atrial fibrillation   . Ulcer of lower limb, unspecified   . Ulcer of lower limb, unspecified   . Human immunodeficiency virus (HIV) disease   . Other severe protein-calorie malnutrition   . Disorders of phosphorus metabolism   . Hypopotassemia   . Anemia in chronic kidney disease(285.21)   . Anemia in chronic kidney disease(285.21)   . Anemia, unspecified   . Hepatitis, unspecified   . Altered mental status   . Hemoptysis, unspecified   . Acute respiratory failure   . Gangrene   . Sepsis(995.91)   . Peripheral vascular disease, unspecified     denies  . End stage renal disease     hemodialysis tues, thurs, sat-east fornesia  . End stage renal disease   . Arthritis     gout   Past Surgical History  Procedure Laterality Date  . Cystoscopy with urethral dilatation  02/17/2012    Procedure: CYSTOSCOPY WITH URETHRAL DILATATION;  Surgeon: Reece Packer, MD;  Location: [facility];  Service: Urology;  Laterality: N/A;  retrograde urethragram, placement of foley catheter exam under anesthesia   . Insertion of dialysis catheter  02/20/2012    Procedure: INSERTION OF DIALYSIS CATHETER;  Surgeon: Angelia Mould, MD;  Location: [facility] OR;  Service: Vascular;  Laterality: N/A;  . Irrigation and debridement abscess  02/29/2012    Procedure: IRRIGATION AND DEBRIDEMENT ABSCESS;  Surgeon: Reece Packer, MD;  Location: [place];  Service: Urology;  Laterality: N/A;   I&D of scrotum abcess  . Insertion of dialysis catheter  03/29/2012    Procedure: INSERTION OF DIALYSIS CATHETER;  Surgeon: Rosetta Posner, MD;  Location[facility] OR;  Service: Vascular;  Laterality: N/A;  Insertion tunneled dialysis catheter right IJ  . Av fistula placement  04/12/2012    Procedure: INSERTION OF ARTERIOVENOUS (AV) GORE-TEX GRAFT ARM;  Surgeon: Rosetta Posner, MD;  Location: [facility] OR;  Service: Vascular;  Laterality: Left;  insertion of 6x50 stretch goretex graft   . Thrombectomy and revision of arterioventous (av) goretex  graft  07/15/2012    Procedure: THROMBECTOMY AND REVISION OF ARTERIOVENTOUS (AV) GORETEX  GRAFT;  Surgeon: Elam Dutch, MD;  Location: [place];  Service: Vascular;  Laterality: Left;  . Urethrogram  07/30/2012    Procedure: URETHROGRAM;  Surgeon: Reece Packer, MD;  Location: [facility];  Service: Urology;  Laterality: N/A;  cystogram  . Cystoscopy  07/30/2012    Procedure: CYSTOSCOPY;  Surgeon: Reece Packer, MD;  Location: [facility];  Service: Urology;  Laterality: Bilateral;  retrograde  . Insertion of dialysis catheter  08/04/2012    Procedure: INSERTION OF DIALYSIS CATHETER;  Surgeon: Angelia Mould, MD;  Location: [place];  Service: Vascular;  Laterality: Right;  right intrajugular hemodialysis catheter insertion  . Av fistula placement  08/14/2012    Procedure: INSERTION OF ARTERIOVENOUS (AV) GORE-TEX GRAFT ARM;  Surgeon: Mal Misty, MD;  Location: [place];  Service: Vascular;  Laterality: Left;  . Colostomy  02/2012    d/t an infection  . Av fistula placement Right 11/08/2012    Procedure: ARTERIOVENOUS (AV) FISTULA CREATION;  Surgeon: Angelia Mould, MD;  Location: [facility] OR;  Service: Vascular;  Laterality: Right;  . Hernia repair      Umbilical  . Surgery for fournier's gangrene  02/2012  . Colon surgery  13    colostomy  . Colostomy closure  02/21/2013  . Colostomy closure N/A 02/21/2013    Procedure: COLOSTOMY CLOSURE;  Surgeon: [name], MD;  Location: [place];  Service: General;  Laterality: N/A;  takedown of sigmoid colostomy with anastomosis  . Bowel resection N/A 02/21/2013    Procedure: SMALL BOWEL RESECTION;  Surgeon: [name], MD;  Location: [place];  Service: General;  Laterality: N/A;   Current Outpatient Prescriptions on File Prior to Visit  Medication Sig Dispense Refill  . allopurinol (ZYLOPRIM) 300 MG tablet TAKE ONE TABLET BY MOUTH ONCE DAILY FOR GOUT  30 tablet  0  . Amino Acids (LIQUACEL PO) Take by mouth 3 (three) times a week. given to him at dialysis      . calcium acetate (PHOSLO) 667 MG capsule Take 667-1,334 mg by mouth 6 (six) times daily. Take 2 capsules by mouth 3 times daily with meals and 1 capsule by mouth 3 times daily with snacks.      . Darunavir Ethanolate (PREZISTA) 800 MG tablet Take 1 tablet (800  mg total) by mouth daily with breakfast.  30 tablet  5  . gabapentin (NEURONTIN) 100 MG capsule Take 1 capsule (100 mg total) by mouth 3 (three) times daily.  90 capsule  5  . ISENTRESS 400 MG tablet TAKE ONE TABLET BY MOUTH BY MOUTH TWICE DAILY  60 tablet  5  . lamivudine (EPIVIR) 100 MG tablet TAKE 1/2 TABLET BY MOUTH EVERY DAY  30 tablet  5  . NORVIR 100 MG TABS tablet TAKE 1 TABLET BY MOUTH EVERY DAY  30 tablet  5  . zidovudine (RETROVIR) 300 MG tablet TAKE ONE TABLET BY MOUTH EVERY DAY  30 tablet  5   No current facility-administered medications on file prior to visit.   Family History  Problem Relation Age of Onset  . Hypertension Mother   . Hypertension Father   . Cancer Cousin     breast   History   Social History  . Marital Status: Legally Separated    Spouse Name: N/A    Number of Children: 0  . Years of Education: N/A   Occupational History  . [employer]   Social History Main Topics  . Smoking status: Never Smoker   . Smokeless tobacco: Never Used  . Alcohol Use: No  . Drug Use: No  . Sexual Activity: No     Comment: declined condoms    Other Topics Concern  . Not on file   Social History Narrative   Single.  Unemployed.  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       Physical exam BP 150/88  Pulse 74  Temp(Src) 96.9 F (36.1 C) (Oral)  Resp 20  Ht 5\' 7"  (1.702 m)  Wt 221 lb (100.245 kg)  BMI 34.61 kg/m2  SpO2 99%  General- elderly male in no acute distress Head- atraumatic, normocephalic Eyes- PERRLA, EOMI, no pallor, no icterus, no discharge Ears- left ear normal tympanic membrane and normal external ear canal , right ear normal tympanic membrane and normal external ear canal Neck- no lymphadenopathy, no thyromegaly, no jugular vein distension, no carotid bruit Nose- normal nasal mucosa, no maxillary sinus tenderness, no frontal sinus tenderness Mouth- normal mucus membrane, no oral thrush, normal oropharynx, poor dentition Chest- no chest wall deformities, no chest wall tenderness Cardiovascular- irregular heart rate, no murmurs, normal distal pulses Respiratory- bilateral clear to auscultation, no wheeze, no rhonchi, no crackles Abdomen- bowel sounds present, soft, non tender, has abdominal wall hernia which is reducible, no guarding or rigidity, no CVA tenderness Musculoskeletal- able to move all 4 extremities, no spinal and paraspinal tenderness, steady gait, no use of assistive device, normal range of motion, no leg edema Neurological- no focal deficit, normal reflexes, normal muscle strength, normal sensation to fine touch and vibration Skin- warm and dry, excessively dry skin, right arm has AV fistula with good thrill, scar from surgery in abdominal wall Psychiatry- alert and oriented to person, place and time, normal mood and affect  Labs-  Lab Results  Component Value Date   WBC 4.4 03/25/2014   HGB 11.1* 03/25/2014   HCT 32.7* 03/25/2014   MCV 105.8* 03/25/2014   PLT 209 03/25/2014   CMP     Component Value Date/Time   NA 140 03/25/2014 1109   NA 138 03/19/2013 1011   K 4.0 03/25/2014  1109   CL 100 03/25/2014 1109   CO2 26 03/25/2014 1109   GLUCOSE 94 03/25/2014 1109   GLUCOSE 84  03/19/2013 1011   BUN 41* 03/25/2014 1109   BUN 16 03/19/2013 1011   CREATININE 9.33* 03/25/2014 1109   CREATININE 4.86* 07/05/2013 1424   CALCIUM 9.6 03/25/2014 1109   CALCIUM 8.7 04/11/2012 0743   PROT 7.3 03/25/2014 1109   PROT 7.3 03/19/2013 1011   ALBUMIN 4.1 03/25/2014 1109   AST 20 03/25/2014 1109   ALT 24 03/25/2014 1109   ALKPHOS 74 03/25/2014 1109   BILITOT 0.8 03/25/2014 1109   GFRNONAA 5* 03/25/2014 1109   GFRNONAA 12* 07/05/2013 1424   GFRAA 6* 03/25/2014 1109   GFRAA 13* 07/05/2013 1424   Lipid Panel     Component Value Date/Time   CHOL 163 09/20/2010 2008   TRIG 116 03/16/2014 0829   HDL 44 03/16/2014 0829   HDL 46 09/20/2010 2008   CHOLHDL 3.0 03/16/2014 0829   CHOLHDL 3.5 Ratio 09/20/2010 2008   VLDL 24 09/20/2010 2008   LDLCALC 67 03/16/2014 0829   LDLCALC 93 09/20/2010 2008   Lab Results  Component Value Date   TSH 5.089* 08/27/2013   06/24/14 ekg- afib, rate 74, QTc 480 Assessment/plan  1. Atrial fibrillation, unspecified Not on any rate controlling agent or antiarrythmic agent, not on any anticoagulation, CHADSVASC score of 2. Will start him on coreg 3.125 mg bid and EC ASA 325 mg daily for now and refer to cardiology - Ambulatory referral to Cardiology  2. Floaters in visual field, bilateral - Ambulatory referral to Ophthalmology  3. Chronic gouty arthropathy Continue allopurinol and check uric acid level - Uric Acid  4. Anemia of chronic renal failure, stage 4 (severe) Likely from his CKD. Recheck cbc - CBC with Differential - TSH - Vitamin D, 1,25-dihydroxy  5. End stage renal disease Continue HD 3 days a week. Continue phoslo  6. Human immunodeficiency virus (HIV) disease Follows with ID. Continue current regimen  7. Essential hypertension Elevated bp reading today, not on any bp med. Starting coreg for rate control and bp, monitor bp at home. Adjust meds if needed  in 4 weeks review - CMP - Lipid Panel - CBC with Differential - TSH  8. Other specified abdominal hernia without obstruction or gangrene Pending surgery appointment to discuss treatment option, warning signs with hernia explained, avoid heavy weight lifting for now  9. Gastroesophageal reflux disease, esophagitis presence not specified Start omeprazole 20 mg daily and reassess  10. Annual physical exam the patient was counseled regarding prevention of dental and periodontal disease, diet, regular sustained exercise for at least 30 minutes 5 times per week, the proper use of sunscreen and protective clothing, and recommended schedule for GI colonoscopy, cholesterol, thyroid and diabetes screening. Has pending gi and surgery appt. Made eye referral. Labs ordered.

## 2014-06-24 NOTE — Patient Instructions (Signed)
Take these new medications  Omeprazole once a day empty stomach for heartburn  Coreg 3.125 mg twice a day for your heart rate and blood pressure  Aspirin once a day for your heart

## 2014-06-25 LAB — CBC WITH DIFFERENTIAL/PLATELET
BASOS: 0 %
Basophils Absolute: 0 10*3/uL (ref 0.0–0.2)
EOS ABS: 0.1 10*3/uL (ref 0.0–0.4)
EOS: 1 %
HCT: 34.2 % — ABNORMAL LOW (ref 37.5–51.0)
HEMOGLOBIN: 11 g/dL — AB (ref 12.6–17.7)
IMMATURE GRANS (ABS): 0 10*3/uL (ref 0.0–0.1)
Immature Granulocytes: 0 %
LYMPHS: 31 %
Lymphocytes Absolute: 2 10*3/uL (ref 0.7–3.1)
MCH: 34.9 pg — ABNORMAL HIGH (ref 26.6–33.0)
MCHC: 32.2 g/dL (ref 31.5–35.7)
MCV: 109 fL — ABNORMAL HIGH (ref 79–97)
MONOCYTES: 9 %
Monocytes Absolute: 0.6 10*3/uL (ref 0.1–0.9)
NEUTROS ABS: 3.8 10*3/uL (ref 1.4–7.0)
NEUTROS PCT: 59 %
RBC: 3.15 x10E6/uL — AB (ref 4.14–5.80)
RDW: 14.6 % (ref 12.3–15.4)
WBC: 6.5 10*3/uL (ref 3.4–10.8)

## 2014-06-25 LAB — COMPREHENSIVE METABOLIC PANEL
A/G RATIO: 1.2 (ref 1.1–2.5)
ALBUMIN: 4.3 g/dL (ref 3.6–4.8)
ALK PHOS: 85 IU/L (ref 39–117)
ALT: 17 IU/L (ref 0–44)
AST: 19 IU/L (ref 0–40)
BUN / CREAT RATIO: 4 — AB (ref 10–22)
BUN: 24 mg/dL (ref 8–27)
CHLORIDE: 94 mmol/L — AB (ref 97–108)
CO2: 23 mmol/L (ref 18–29)
Calcium: 10.1 mg/dL (ref 8.6–10.2)
Creatinine, Ser: 6.25 mg/dL — ABNORMAL HIGH (ref 0.76–1.27)
GFR calc Af Amer: 10 mL/min/{1.73_m2} — ABNORMAL LOW (ref >59)
GFR, EST NON AFRICAN AMERICAN: 9 mL/min/{1.73_m2} — AB (ref >59)
GLUCOSE: 87 mg/dL (ref 65–99)
Globulin, Total: 3.7 g/dL (ref 1.5–4.5)
POTASSIUM: 3.7 mmol/L (ref 3.5–5.2)
Sodium: 137 mmol/L (ref 134–144)
TOTAL PROTEIN: 8 g/dL (ref 6.0–8.5)
Total Bilirubin: 0.4 mg/dL (ref 0.0–1.2)

## 2014-06-25 LAB — LIPID PANEL
CHOL/HDL RATIO: 3.8 ratio (ref 0.0–5.0)
Cholesterol, Total: 167 mg/dL (ref 100–199)
HDL: 44 mg/dL (ref >39)
LDL CALC: 100 mg/dL — AB (ref 0–99)
Triglycerides: 113 mg/dL (ref 0–149)
VLDL CHOLESTEROL CAL: 23 mg/dL (ref 5–40)

## 2014-06-25 LAB — VITAMIN D 1,25 DIHYDROXY: VIT D 1 25 DIHYDROXY: 151 pg/mL — AB (ref 19.9–79.3)

## 2014-06-25 LAB — TSH: TSH: 6.86 u[IU]/mL — AB (ref 0.450–4.500)

## 2014-06-25 LAB — URIC ACID: Uric Acid: 4.1 mg/dL (ref 3.7–8.6)

## 2014-06-30 LAB — T3: T3, Total: 170 ng/dL (ref 71–180)

## 2014-06-30 LAB — SPECIMEN STATUS REPORT

## 2014-06-30 LAB — T4: T4, Total: 7 ug/dL (ref 4.5–12.0)

## 2014-07-01 ENCOUNTER — Encounter: Payer: Self-pay | Admitting: Cardiovascular Disease

## 2014-07-01 ENCOUNTER — Ambulatory Visit (INDEPENDENT_AMBULATORY_CARE_PROVIDER_SITE_OTHER): Payer: BC Managed Care – PPO | Admitting: Cardiovascular Disease

## 2014-07-01 ENCOUNTER — Telehealth: Payer: Self-pay | Admitting: *Deleted

## 2014-07-01 VITALS — BP 136/80 | HR 81 | Ht 71.0 in | Wt 226.0 lb

## 2014-07-01 DIAGNOSIS — B1710 Acute hepatitis C without hepatic coma: Secondary | ICD-10-CM

## 2014-07-01 DIAGNOSIS — B20 Human immunodeficiency virus [HIV] disease: Secondary | ICD-10-CM

## 2014-07-01 DIAGNOSIS — I10 Essential (primary) hypertension: Secondary | ICD-10-CM

## 2014-07-01 DIAGNOSIS — N186 End stage renal disease: Secondary | ICD-10-CM

## 2014-07-01 DIAGNOSIS — I4821 Permanent atrial fibrillation: Secondary | ICD-10-CM

## 2014-07-01 DIAGNOSIS — I482 Chronic atrial fibrillation: Secondary | ICD-10-CM

## 2014-07-01 MED ORDER — CARVEDILOL 3.125 MG PO TABS: 3.1250 mg | ORAL_TABLET | Freq: Two times a day (BID) | ORAL | Status: DC

## 2014-07-01 MED ORDER — ASPIRIN EC 325 MG PO TBEC: 325.0000 mg | DELAYED_RELEASE_TABLET | Freq: Every day | ORAL | Status: DC

## 2014-07-01 NOTE — Patient Instructions (Signed)
Your physician wants you to follow-up in:   6 MONTHS WITH  DR [name] You will receive a reminder letter in the mail two months in advance. If you don't receive a letter, please call our office to schedule the follow-up appointment. Your physician recommends that you continue on your current medications as directed. Please refer to the Current Medication list given to you today. START  CARVEDILOL 3.125 MG  TWICE  DAY  AND  ASPIRIN  325 MG EVERY DAY

## 2014-07-01 NOTE — Assessment & Plan Note (Signed)
Recnet CD4 T cell count 510  Compliant with anti retroviral  No opportunistic infections f/u ID

## 2014-07-01 NOTE — Progress Notes (Signed)
Patient ID: Bryan Gilmore, male   DOB: 14-Nov-1950, 63 y.o.   MRN: [id number]   63 yo referred by Dr Bubba Camp for afib.  Actually seen by Dr [name] in 7/13 for same problem.  Dr [name] felt afib was permanent with good rate control , asymptomatic and not a good candidate for anticoagulation with comorbidities.  . Patient has a history of end-stage renal disease beginning in June of 2013. He is status post surgery in June of 2013 for Fournier's gangrene with debridement. He also had a Hartmann pouch with end colostomy and patient denies any known history of cardiac disease. umbilical hernia repair during that admission. Following this hospitalization he was admitted to [facility] until 03/26/2012 when he was discharged to [place] home. He was readmitted on 03/29/2012 when he presented with confusion. He also pulled out his dialysis catheter. He developed hyperkalemia and required emergent dialysis. It was noted on his ECG that he was in atrial fibrillation with a controlled ventricular response. This is confirmed by review of his telemetry. In fact the patient was in atrial fibrillation throughout his prior hospitalization from June 8 until 02/29/2012 but there was no mention of it in his previous assessment. Patient denies any known history of cardiac disease. He denies any history of arrhythmia. He has no history of congestive heart failure or coronary disease. There is no history of murmur. Patient does have a history of hypertension and HIV positivity. He also has a history of hepatitis C and severe protein calorie malnutrition. He denies any symptoms of chest pain or shortness of breath. He denies any palpitations. He has had no dizziness or syncope.  Review of med list with patient indicates he has not been taking aspirin or coreg  Echo 2013 with normal EF and mild LAE  Study Conclusions  - Left ventricle: The cavity size was normal. Wall thickness was increased in a pattern of moderate  LVH. Systolic function was normal. The estimated ejection fraction was in the range of 55% to 60%. Indeterminant diastolic function. Wall motion was normal; there were no regional wall motion abnormalities. - Aortic valve: There was no stenosis. - Mitral valve: No significant regurgitation. - Left atrium: The atrium was mildly dilated. - Right ventricle: The cavity size was normal. Systolic function was normal. - Right atrium: The atrium was mildly dilated. - Pulmonary arteries: No complete TR doppler jet so unable to estimate PA systolic pressure. - Inferior vena cava: The vessel was normal in size; the respirophasic diameter changes were in the normal range (= 50%); findings are consistent with normal central venous pressure.  His [place] VASC score is only one  ROS: Denies fever, malais, weight loss, blurry vision, decreased visual acuity, cough, sputum, SOB, hemoptysis, pleuritic pain, palpitaitons, heartburn, abdominal pain, melena, lower extremity edema, claudication, or rash.  All other systems reviewed and negative   General: Affect appropriate Chronically ill obese black male  HEENT: normal Neck supple with no adenopathy JVP normal no bruits no thyromegaly Lungs clear with no wheezing and good diaphragmatic motion Heart:  S1/S2 SEM  murmur,rub, gallop or click PMI normal Abdomen: benighn, BS positve, no tenderness, Multiple large ventral hernieas with previous colostomy scar  no bruit.  No HSM or HJR Non functional fistulas LUE  Thrill in RUE fistula  No edema Neuro non-focal Skin warm and dry No muscular weakness  Medications Current Outpatient Prescriptions  Medication Sig Dispense Refill  . allopurinol (ZYLOPRIM) 300 MG tablet TAKE ONE TABLET BY MOUTH  ONCE DAILY FOR GOUT  30 tablet  0  . Amino Acids (LIQUACEL PO) Take by mouth 3 (three) times a week. given to him at dialysis      . calcium acetate (PHOSLO) 667 MG capsule Take 667-1,334 mg by mouth 6 (six) times  daily. Take 2 capsules by mouth 3 times daily with meals and 1 capsule by mouth 3 times daily with snacks.      . Darunavir Ethanolate (PREZISTA) 800 MG tablet Take 1 tablet (800 mg total) by mouth daily with breakfast.  30 tablet  5  . gabapentin (NEURONTIN) 100 MG capsule Take 1 capsule (100 mg total) by mouth 3 (three) times daily.  90 capsule  5  . ISENTRESS 400 MG tablet TAKE ONE TABLET BY MOUTH BY MOUTH TWICE DAILY  60 tablet  5  . lamivudine (EPIVIR) 100 MG tablet TAKE 1/2 TABLET BY MOUTH EVERY DAY  30 tablet  5  . Ledipasvir-Sofosbuvir (HARVONI) 90-400 MG TABS Take by mouth.       . NORVIR 100 MG TABS tablet TAKE 1 TABLET BY MOUTH EVERY DAY  30 tablet  5  . zidovudine (RETROVIR) 300 MG tablet TAKE ONE TABLET BY MOUTH EVERY DAY  30 tablet  5   No current facility-administered medications for this visit.    Allergies Review of patient's allergies indicates no known allergies.  Family History: Family History  Problem Relation Age of Onset  . Hypertension Mother   . Hypertension Father   . Cancer Cousin     breast    Social History: History   Social History  . Marital Status: Legally Separated    Spouse Name: N/A    Number of Children: 0  . Years of Education: N/A   Occupational History  . [employer]   Social History Main Topics  . Smoking status: Never Smoker   . Smokeless tobacco: Never Used  . Alcohol Use: No  . Drug Use: No  . Sexual Activity: No     Comment: declined condoms   Other Topics Concern  . Not on file   Social History Narrative   Single.  Unemployed.  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      Electrocardiogram:  afib rate 81  Nonspecific ST/T wave changes   Assessment and Plan

## 2014-07-01 NOTE — Assessment & Plan Note (Signed)
Dialysis M/W/F  RUE fistula fine.  No hemodynamic changes F/U Dr Clover Mealy

## 2014-07-01 NOTE — Assessment & Plan Note (Signed)
Has 5 weeks more of Rx f/u with GI

## 2014-07-01 NOTE — Telephone Encounter (Signed)
Patient called and left voice message that he wanted a refill on a medication. Tried calling patient and LMOM to return call.

## 2014-07-01 NOTE — Assessment & Plan Note (Signed)
Well controlled.  Continue current medications and low sodium Dash type diet.   No hypotension on dialysis

## 2014-07-01 NOTE — Assessment & Plan Note (Signed)
Agree with previous assessment by Dr [name].  Asymptomatic long standing chronic afib with multiple comorbidities  [place] VASC score only one.  Coreg called in for rate control  Continue ASA.  F/U with Dr [name]

## 2014-07-08 DIAGNOSIS — K432 Incisional hernia without obstruction or gangrene: Secondary | ICD-10-CM | POA: Diagnosis not present

## 2014-07-09 ENCOUNTER — Ambulatory Visit: Payer: BC Managed Care – PPO | Admitting: Internal Medicine

## 2014-07-15 ENCOUNTER — Encounter: Payer: Self-pay | Admitting: Internal Medicine

## 2014-07-15 ENCOUNTER — Ambulatory Visit (INDEPENDENT_AMBULATORY_CARE_PROVIDER_SITE_OTHER): Payer: BC Managed Care – PPO | Admitting: Internal Medicine

## 2014-07-15 VITALS — BP 132/80 | HR 55 | Temp 97.9°F | Resp 10 | Ht 71.0 in | Wt 228.0 lb

## 2014-07-15 DIAGNOSIS — E039 Hypothyroidism, unspecified: Secondary | ICD-10-CM

## 2014-07-15 MED ORDER — LEVOTHYROXINE SODIUM 25 MCG PO TABS: 25.0000 ug | ORAL_TABLET | Freq: Every day | ORAL | Status: DC

## 2014-07-15 NOTE — Progress Notes (Signed)
Patient ID: Bryan Gilmore, male   DOB: [birth year], 63 y.o.   MRN: [id number]    Chief Complaint  Patient presents with  . Follow-up    Discuss labs    No Known Allergies  HPI 63 y/o male patient here to review his thyroid labs. He was sen recently for annual exam and his thyroid panel on review is suggestive of hypothyroidism. He is not on any meds.  He is overweight and denies any cold intolerance. Bowel movement is regular. On HIV medication and dialysis  ROS Denies chills, cold intolerance Denies chest pain, dyspnea Denies headache Denies dysphagia  Past Medical History  Diagnosis Date  . HIV (human immunodeficiency virus infection)   . Sepsis(995.91)   . Gout   . Hepatitis C   . Fournier's gangrene   . Anemia   . Severe protein-calorie malnutrition   . Alcohol abuse   . Dysrhythmia     AFib, not a candidate for coumadin , dr [name] signed off on pt  . Hypertension   . Colostomy status   . Sebaceous cyst   . Cough   . Unspecified constipation   . Gout, unspecified   . GERD (gastroesophageal reflux disease)   . Atrial fibrillation   . Ulcer of lower limb, unspecified   . Ulcer of lower limb, unspecified   . Human immunodeficiency virus (HIV) disease   . Other severe protein-calorie malnutrition   . Disorders of phosphorus metabolism   . Hypopotassemia   . Anemia in chronic kidney disease(285.21)   . Anemia in chronic kidney disease(285.21)   . Anemia, unspecified   . Hepatitis, unspecified   . Altered mental status   . Hemoptysis, unspecified   . Acute respiratory failure   . Gangrene   . Sepsis(995.91)   . Peripheral vascular disease, unspecified     denies  . End stage renal disease     hemodialysis tues, thurs, sat-east fornesia  . End stage renal disease   . Arthritis     gout   Current Outpatient Prescriptions on File Prior to Visit  Medication Sig Dispense Refill  . allopurinol (ZYLOPRIM) 300 MG tablet TAKE ONE TABLET BY MOUTH ONCE DAILY FOR  GOUT 30 tablet 0  . Amino Acids (LIQUACEL PO) Take by mouth 3 (three) times a week. given to him at dialysis    . aspirin EC 325 MG tablet Take 1 tablet (325 mg total) by mouth daily. 30 tablet 0  . calcium acetate (PHOSLO) 667 MG capsule Take 667-1,334 mg by mouth 6 (six) times daily. Take 2 capsules by mouth 3 times daily with meals and 1 capsule by mouth 3 times daily with snacks.    . carvedilol (COREG) 3.125 MG tablet Take 1 tablet (3.125 mg total) by mouth 2 (two) times daily. 180 tablet 3  . Darunavir Ethanolate (PREZISTA) 800 MG tablet Take 1 tablet (800 mg total) by mouth daily with breakfast. 30 tablet 5  . gabapentin (NEURONTIN) 100 MG capsule Take 1 capsule (100 mg total) by mouth 3 (three) times daily. 90 capsule 5  . ISENTRESS 400 MG tablet TAKE ONE TABLET BY MOUTH BY MOUTH TWICE DAILY 60 tablet 5  . lamivudine (EPIVIR) 100 MG tablet TAKE 1/2 TABLET BY MOUTH EVERY DAY 30 tablet 5  . NORVIR 100 MG TABS tablet TAKE 1 TABLET BY MOUTH EVERY DAY 30 tablet 5  . zidovudine (RETROVIR) 300 MG tablet TAKE ONE TABLET BY MOUTH EVERY DAY 30 tablet 5   No  current facility-administered medications on file prior to visit.    Physical exam BP 132/80 mmHg  Pulse 55  Temp(Src) 97.9 F (36.6 C) (Oral)  Resp 10  Ht 5\' 11"  (1.803 m)  Wt 228 lb (103.42 kg)  BMI 31.81 kg/m2  General- elderly male in no acute distress, obese Head- atraumatic, normocephalic Eyes- PERRLA, EOMI, no pallor, no icterus, no discharge Neck- no lymphadenopathy, no thyromegaly Mouth- normal mucus membrane, no oral thrush, normal oropharynx, poor dentition Cardiovascular- irregular heart rate, no murmurs, normal distal pulses Respiratory- bilateral clear to auscultation, no wheeze, no rhonchi, no crackles Neurological- no focal deficit Skin- warm and dry, excessively dry skin, right arm has AV fistula with good thrill, scar from surgery in abdominal wall Psychiatry- alert and oriented to person, place and time, normal  mood and affect  Labs Lab Results  Component Value Date   TSH 6.860* 06/24/2014    Assessment/plan  1. Hypothyroidism, unspecified hypothyroidism type Start levothyroxine 25 mcg daily. Recheck tsh in 8 weeks and adjust medication if needed - TSH; Future

## 2014-07-20 ENCOUNTER — Other Ambulatory Visit: Payer: BC Managed Care – PPO

## 2014-07-20 ENCOUNTER — Encounter: Payer: Self-pay | Admitting: Internal Medicine

## 2014-07-20 ENCOUNTER — Ambulatory Visit (INDEPENDENT_AMBULATORY_CARE_PROVIDER_SITE_OTHER): Payer: BC Managed Care – PPO | Admitting: Internal Medicine

## 2014-07-20 VITALS — BP 174/94 | HR 88 | Temp 98.4°F | Wt 230.0 lb

## 2014-07-20 DIAGNOSIS — K439 Ventral hernia without obstruction or gangrene: Secondary | ICD-10-CM

## 2014-07-20 DIAGNOSIS — B182 Chronic viral hepatitis C: Secondary | ICD-10-CM | POA: Diagnosis not present

## 2014-07-20 DIAGNOSIS — Z21 Asymptomatic human immunodeficiency virus [HIV] infection status: Secondary | ICD-10-CM | POA: Diagnosis not present

## 2014-07-20 LAB — COMPLETE METABOLIC PANEL WITH GFR
ALK PHOS: 76 U/L (ref 39–117)
ALT: 12 U/L (ref 0–53)
AST: 15 U/L (ref 0–37)
Albumin: 3.7 g/dL (ref 3.5–5.2)
BILIRUBIN TOTAL: 0.4 mg/dL (ref 0.2–1.2)
BUN: 28 mg/dL — ABNORMAL HIGH (ref 6–23)
CO2: 24 mEq/L (ref 19–32)
Calcium: 10.3 mg/dL (ref 8.4–10.5)
Chloride: 100 mEq/L (ref 96–112)
Creat: 7.23 mg/dL — ABNORMAL HIGH (ref 0.50–1.35)
GFR, Est African American: 8 mL/min — ABNORMAL LOW
GFR, Est Non African American: 7 mL/min — ABNORMAL LOW
Glucose, Bld: 81 mg/dL (ref 70–99)
Potassium: 4.9 mEq/L (ref 3.5–5.3)
SODIUM: 139 meq/L (ref 135–145)
Total Protein: 7.4 g/dL (ref 6.0–8.3)

## 2014-07-20 LAB — CBC WITH DIFFERENTIAL/PLATELET
BASOS ABS: 0 10*3/uL (ref 0.0–0.1)
BASOS PCT: 0 % (ref 0–1)
EOS ABS: 0.1 10*3/uL (ref 0.0–0.7)
Eosinophils Relative: 2 % (ref 0–5)
HCT: 33.7 % — ABNORMAL LOW (ref 39.0–52.0)
HEMOGLOBIN: 11.1 g/dL — AB (ref 13.0–17.0)
Lymphocytes Relative: 36 % (ref 12–46)
Lymphs Abs: 1.8 10*3/uL (ref 0.7–4.0)
MCH: 35 pg — AB (ref 26.0–34.0)
MCHC: 32.9 g/dL (ref 30.0–36.0)
MCV: 106.3 fL — ABNORMAL HIGH (ref 78.0–100.0)
MONOS PCT: 11 % (ref 3–12)
Monocytes Absolute: 0.5 10*3/uL (ref 0.1–1.0)
NEUTROS PCT: 51 % (ref 43–77)
Neutro Abs: 2.5 10*3/uL (ref 1.7–7.7)
Platelets: 217 10*3/uL (ref 150–400)
RBC: 3.17 MIL/uL — ABNORMAL LOW (ref 4.22–5.81)
RDW: 14.5 % (ref 11.5–15.5)
WBC: 4.9 10*3/uL (ref 4.0–10.5)

## 2014-07-20 NOTE — Progress Notes (Signed)
Patient ID: Bryan Gilmore, male   DOB: 07-Nov-1950, 63 y.o.   MRN: [id number]       Patient ID: Bryan Gilmore, male   DOB: [birth year], 63 y.o.   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 He also just starting 3rd month of led/sof for treatment of HCV. He saw Dr. Excell Seltzer for abdominal wall hernia, considering surgery. Doing well overall. Increasing his appetite. No diarrhea.   Outpatient Encounter Prescriptions as of 07/20/2014  Medication Sig  . allopurinol (ZYLOPRIM) 300 MG tablet TAKE ONE TABLET BY MOUTH ONCE DAILY FOR GOUT  . Amino Acids (LIQUACEL PO) Take by mouth 3 (three) times a week. given to him at dialysis  . aspirin EC 325 MG tablet Take 1 tablet (325 mg total) by mouth daily.  . calcium acetate (PHOSLO) 667 MG capsule Take 667-1,334 mg by mouth 6 (six) times daily. Take 2 capsules by mouth 3 times daily with meals and 1 capsule by mouth 3 times daily with snacks.  . carvedilol (COREG) 3.125 MG tablet Take 1 tablet (3.125 mg total) by mouth 2 (two) times daily.  . Darunavir Ethanolate (PREZISTA) 800 MG tablet Take 1 tablet (800 mg total) by mouth daily with breakfast.  . gabapentin (NEURONTIN) 100 MG capsule Take 1 capsule (100 mg total) by mouth 3 (three) times daily.  . ISENTRESS 400 MG tablet TAKE ONE TABLET BY MOUTH BY MOUTH TWICE DAILY  . lamivudine (EPIVIR) 100 MG tablet TAKE 1/2 TABLET BY MOUTH EVERY DAY  . Ledipasvir-Sofosbuvir (HARVONI) 90-400 MG TABS Take by mouth daily.  Marland Kitchen levothyroxine (LEVOTHROID) 25 MCG tablet Take 1 tablet (25 mcg total) by mouth daily before breakfast.  . NORVIR 100 MG TABS tablet TAKE 1 TABLET BY MOUTH EVERY DAY  . zidovudine (RETROVIR) 300 MG tablet TAKE ONE TABLET BY MOUTH EVERY DAY     Patient Active Problem List   Diagnosis Date Noted  . Essential hypertension 06/24/2014  . Floaters in visual field 06/24/2014  . Atrial fibrillation, unspecified 06/24/2014  . Other  specified abdominal hernia without obstruction or gangrene 06/24/2014  . Esophageal reflux 06/24/2014  . Annual physical exam 06/24/2014  . Gynecomastia 11/12/2013  . Other complications due to renal dialysis device, implant, and graft 01/29/2013  . Colostomy status 12/26/2012  . Aftercare following surgery of the circulatory system, [place] 12/18/2012  . End stage renal disease 10/30/2012  . Fournier's gangrene in male 06/05/2012  . Metabolic encephalopathy 01/74/9449  . Alcohol abuse   . Atrial fibrillation, permanent 04/04/2012  . Fever 04/01/2012  . Delirium 04/01/2012  . Hyperkalemia 03/29/2012  . Secondary hyperparathyroidism (of renal origin) 03/29/2012  . ESRD (end stage renal disease) 02/29/2012  . Anemia of chronic renal failure 02/29/2012  . Normocytic anemia 02/17/2012  . Acute hepatitis C virus infection 10/07/2010  . Chronic gouty arthropathy 10/07/2010  . 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 Human immunodeficiency virus (HIV) disease 09/15/2010     Health Maintenance Due  Topic Date Due  . COLONOSCOPY  11/18/2000  . ZOSTAVAX  11/19/2010     Review of Systems  Physical Exam   BP 174/94 mmHg  Pulse 88  Temp(Src) 98.4 F (36.9 C) (Oral)  Wt 230 lb (104.327 kg) Physical Exam  Constitutional: He is oriented to person, place, and time. He appears well-developed and well-nourished. No distress.  HENT:  Mouth/Throat: Oropharynx is clear and moist. No oropharyngeal exudate.  Cardiovascular: Normal rate,  regular rhythm and normal heart sounds. Exam reveals no gallop and no friction rub.  No murmur heard.  Pulmonary/Chest: Effort normal and breath sounds normal. No respiratory distress. He has no wheezes.  Abdominal: Soft. Bowel sounds are normal. He exhibits no distension. There is no tenderness. Increasing abdominal girth. No fluid wave Lymphadenopathy:  He has no cervical adenopathy.  Neurological: He is alert and oriented to person, place, and time.    Skin: Skin is warm and dry. No rash noted. No erythema.  Psychiatric: He has a normal mood and affect. His behavior is normal.    Lab Results  Component Value Date   CD4TCELL 30* 03/25/2014   Lab Results  Component Value Date   CD4TABS 510 03/25/2014   CD4TABS 360* 11/10/2013   CD4TABS 340* 08/27/2013   Lab Results  Component Value Date   HIV1RNAQUANT <20 03/25/2014   Lab Results  Component Value Date   HEPBSAB NEGATIVE 02/18/2012   No results found for: RPR  CBC Lab Results  Component Value Date   WBC 6.5 06/24/2014   RBC 3.15* 06/24/2014   HGB 11.0* 06/24/2014   HCT 34.2* 06/24/2014   PLT 209 03/25/2014   MCV 109* 06/24/2014   MCH 34.9* 06/24/2014   MCHC 32.2 06/24/2014   RDW 14.6 06/24/2014   LYMPHSABS 2.0 06/24/2014   MONOABS 0.5 03/25/2014   EOSABS 0.1 06/24/2014   BASOSABS 0.0 06/24/2014   BMET Lab Results  Component Value Date   NA 137 06/24/2014   K 3.7 06/24/2014   CL 94* 06/24/2014   CO2 23 06/24/2014   GLUCOSE 87 06/24/2014   BUN 24 06/24/2014   CREATININE 6.25* 06/24/2014   CALCIUM 10.1 06/24/2014   GFRNONAA 9* 06/24/2014   GFRAA 10* 06/24/2014     Assessment and Plan  hcv = continue on harvoni to end 12 wks at the end of this month and will check viral load today and at 12 weeks after completion of medications to ensure SVR12.  hiv = will check labs, continue on current regimen well controlled  Abdominal hernia = will discuss plans with dr. Excell Seltzer  cdi = resolved, no longer needs treatment for c.difficile  rtc in 3 months

## 2014-07-21 LAB — HEPATITIS C RNA QUANTITATIVE: HCV QUANT: NOT DETECTED [IU]/mL (ref <15)

## 2014-07-21 LAB — HIV-1 RNA QUANT-NO REFLEX-BLD

## 2014-07-21 LAB — T-HELPER CELL (CD4) - (RCID CLINIC ONLY)
CD4 % Helper T Cell: 34 % (ref 33–55)
CD4 T CELL ABS: 650 /uL (ref 400–2700)

## 2014-07-27 DIAGNOSIS — H179 Unspecified corneal scar and opacity: Secondary | ICD-10-CM | POA: Diagnosis not present

## 2014-07-27 DIAGNOSIS — H2513 Age-related nuclear cataract, bilateral: Secondary | ICD-10-CM | POA: Diagnosis not present

## 2014-07-27 DIAGNOSIS — H43393 Other vitreous opacities, bilateral: Secondary | ICD-10-CM | POA: Diagnosis not present

## 2014-07-29 ENCOUNTER — Encounter: Payer: Self-pay | Admitting: Internal Medicine

## 2014-07-29 ENCOUNTER — Ambulatory Visit (INDEPENDENT_AMBULATORY_CARE_PROVIDER_SITE_OTHER): Payer: BC Managed Care – PPO | Admitting: Internal Medicine

## 2014-07-29 VITALS — BP 160/100 | HR 68 | Temp 97.9°F | Ht 71.0 in | Wt 227.8 lb

## 2014-07-29 DIAGNOSIS — I10 Essential (primary) hypertension: Secondary | ICD-10-CM | POA: Diagnosis not present

## 2014-07-29 DIAGNOSIS — E039 Hypothyroidism, unspecified: Secondary | ICD-10-CM | POA: Diagnosis not present

## 2014-07-29 DIAGNOSIS — K219 Gastro-esophageal reflux disease without esophagitis: Secondary | ICD-10-CM

## 2014-07-29 MED ORDER — CARVEDILOL 6.25 MG PO TABS: 6.2500 mg | ORAL_TABLET | Freq: Two times a day (BID) | ORAL | Status: DC

## 2014-07-29 NOTE — Progress Notes (Signed)
Patient ID: Bryan Gilmore, male   DOB: 14-Mar-1951, 63 y.o.   MRN: [id number]    Chief Complaint  Patient presents with  . Follow-up    4 week follow up   No Known Allergies  HPI 63 y/o male pt here for follow up on HTN and reflux.  bp at home 112/64 yesterday post dialysis. Pre dialysis 190/ 90. His bp reading have been 180-210/90-105  Heartburn is under control. He did not start the omeprazole. He does not want his bp medication changed. Taking his levothyroxine as advised  ROS Denies headache or change of vision Denies chest pan or dyspnea Denies abdominal pain Has been noticing that he is not able to empty bladder completely at times Denies dizziness  Past Medical History  Diagnosis Date  . HIV (human immunodeficiency virus infection)   . Sepsis(995.91)   . Gout   . Hepatitis C   . Fournier's gangrene   . Anemia   . Severe protein-calorie malnutrition   . Alcohol abuse   . Dysrhythmia     AFib, not a candidate for coumadin , dr [name] signed off on pt  . Hypertension   . Colostomy status   . Sebaceous cyst   . Cough   . Unspecified constipation   . Gout, unspecified   . GERD (gastroesophageal reflux disease)   . Atrial fibrillation   . Ulcer of lower limb, unspecified   . Ulcer of lower limb, unspecified   . Human immunodeficiency virus (HIV) disease   . Other severe protein-calorie malnutrition   . Disorders of phosphorus metabolism   . Hypopotassemia   . Anemia in chronic kidney disease(285.21)   . Anemia in chronic kidney disease(285.21)   . Anemia, unspecified   . Hepatitis, unspecified   . Altered mental status   . Hemoptysis, unspecified   . Acute respiratory failure   . Gangrene   . Sepsis(995.91)   . Peripheral vascular disease, unspecified     denies  . End stage renal disease     hemodialysis tues, thurs, sat-east fornesia  . End stage renal disease   . Arthritis     gout   Current Outpatient Prescriptions on File Prior to Visit    Medication Sig Dispense Refill  . allopurinol (ZYLOPRIM) 300 MG tablet TAKE ONE TABLET BY MOUTH ONCE DAILY FOR GOUT 30 tablet 0  . Amino Acids (LIQUACEL PO) Take by mouth 3 (three) times a week. given to him at dialysis    . aspirin EC 325 MG tablet Take 1 tablet (325 mg total) by mouth daily. 30 tablet 0  . calcium acetate (PHOSLO) 667 MG capsule Take 667-1,334 mg by mouth 6 (six) times daily. Take 2 capsules by mouth 3 times daily with meals and 1 capsule by mouth 3 times daily with snacks.    . Darunavir Ethanolate (PREZISTA) 800 MG tablet Take 1 tablet (800 mg total) by mouth daily with breakfast. 30 tablet 5  . gabapentin (NEURONTIN) 100 MG capsule Take 1 capsule (100 mg total) by mouth 3 (three) times daily. 90 capsule 5  . ISENTRESS 400 MG tablet TAKE ONE TABLET BY MOUTH BY MOUTH TWICE DAILY 60 tablet 5  . lamivudine (EPIVIR) 100 MG tablet TAKE 1/2 TABLET BY MOUTH EVERY DAY 30 tablet 5  . Ledipasvir-Sofosbuvir (HARVONI) 90-400 MG TABS Take by mouth daily.    Marland Kitchen levothyroxine (LEVOTHROID) 25 MCG tablet Take 1 tablet (25 mcg total) by mouth daily before breakfast. 90 tablet 3  .  NORVIR 100 MG TABS tablet TAKE 1 TABLET BY MOUTH EVERY DAY 30 tablet 5  . zidovudine (RETROVIR) 300 MG tablet TAKE ONE TABLET BY MOUTH EVERY DAY 30 tablet 5   No current facility-administered medications on file prior to visit.   Physical exam BP 160/100 mmHg  Pulse 68  Temp(Src) 97.9 F (36.6 C) (Oral)  Ht 5\' 11"  (1.803 m)  Wt 227 lb 12.8 oz (103.329 kg)  BMI 31.79 kg/m2  SpO2 99%  General- elderly male in no acute distress, obese Head- atraumatic, normocephalic Eyes- PERRLA, EOMI, no pallor, no icterus, no discharge Neck- no lymphadenopathy, no thyromegaly Mouth- normal mucus membrane, no oral thrush, normal oropharynx, poor dentition Cardiovascular- irregular heart rate, no murmurs, normal distal pulses Respiratory- bilateral clear to auscultation, no wheeze, no rhonchi, no crackles Neurological- no  focal deficit Skin- warm and dry, excessively dry skin, right arm has AV fistula with good thrill, scar from surgery in abdominal wall Psychiatry- alert and oriented to person, place and time, normal mood and affect  Labs  Recent Labs    Lab Results   Component  Value  Date     TSH  6.860*  06/24/2014       Assessment/plan  1. Essential hypertension Change coreg to 6.25 mg bid for now. Check bp at home and reassess in 4 weeks  2. Gastroesophageal reflux disease, esophagitis presence not specified Symptoms resolved, off all PPI  3. Hypothyroidism, unspecified hypothyroidism type Continue levothyroxine 25 mcg daily. Recheck tsh and adjust medication if needed

## 2014-07-31 DIAGNOSIS — T82858A Stenosis of vascular prosthetic devices, implants and grafts, initial encounter: Secondary | ICD-10-CM | POA: Diagnosis not present

## 2014-07-31 DIAGNOSIS — I871 Compression of vein: Secondary | ICD-10-CM | POA: Diagnosis not present

## 2014-07-31 DIAGNOSIS — Z992 Dependence on renal dialysis: Secondary | ICD-10-CM | POA: Diagnosis not present

## 2014-07-31 DIAGNOSIS — N182 Chronic kidney disease, stage 2 (mild): Secondary | ICD-10-CM | POA: Diagnosis not present

## 2014-08-10 ENCOUNTER — Other Ambulatory Visit (INDEPENDENT_AMBULATORY_CARE_PROVIDER_SITE_OTHER): Payer: BC Managed Care – PPO

## 2014-08-10 DIAGNOSIS — B1920 Unspecified viral hepatitis C without hepatic coma: Secondary | ICD-10-CM

## 2014-08-11 LAB — HEPATITIS C RNA QUANTITATIVE: HCV Quantitative: NOT DETECTED IU/mL (ref <15)

## 2014-08-20 ENCOUNTER — Encounter (HOSPITAL_COMMUNITY): Payer: Self-pay | Admitting: Vascular Surgery

## 2014-08-24 DIAGNOSIS — R140 Abdominal distension (gaseous): Secondary | ICD-10-CM | POA: Diagnosis not present

## 2014-08-24 DIAGNOSIS — B1920 Unspecified viral hepatitis C without hepatic coma: Secondary | ICD-10-CM | POA: Diagnosis not present

## 2014-08-24 DIAGNOSIS — Z1211 Encounter for screening for malignant neoplasm of colon: Secondary | ICD-10-CM | POA: Diagnosis not present

## 2014-08-24 DIAGNOSIS — K7460 Unspecified cirrhosis of liver: Secondary | ICD-10-CM | POA: Diagnosis not present

## 2014-08-26 ENCOUNTER — Other Ambulatory Visit (INDEPENDENT_AMBULATORY_CARE_PROVIDER_SITE_OTHER): Payer: Self-pay | Admitting: General Surgery

## 2014-08-31 ENCOUNTER — Encounter: Payer: Self-pay | Admitting: Nurse Practitioner

## 2014-08-31 ENCOUNTER — Other Ambulatory Visit: Payer: Self-pay

## 2014-08-31 ENCOUNTER — Ambulatory Visit (INDEPENDENT_AMBULATORY_CARE_PROVIDER_SITE_OTHER): Payer: BC Managed Care – PPO | Admitting: Nurse Practitioner

## 2014-08-31 ENCOUNTER — Other Ambulatory Visit: Payer: Self-pay | Admitting: Internal Medicine

## 2014-08-31 VITALS — BP 140/80 | HR 82 | Temp 97.7°F | Resp 20 | Ht 71.0 in | Wt 230.8 lb

## 2014-08-31 DIAGNOSIS — I10 Essential (primary) hypertension: Secondary | ICD-10-CM

## 2014-08-31 DIAGNOSIS — K469 Unspecified abdominal hernia without obstruction or gangrene: Secondary | ICD-10-CM | POA: Diagnosis not present

## 2014-08-31 DIAGNOSIS — E039 Hypothyroidism, unspecified: Secondary | ICD-10-CM

## 2014-08-31 DIAGNOSIS — R51 Headache: Secondary | ICD-10-CM

## 2014-08-31 DIAGNOSIS — R519 Headache, unspecified: Secondary | ICD-10-CM

## 2014-08-31 NOTE — Progress Notes (Signed)
Patient ID: Bryan Gilmore, male   DOB: Jan 17, 1951, 63 y.o.   MRN: [id number]    PCP: Blanchie Serve, MD  No Known Allergies  Chief Complaint  Patient presents with  . Medical Management of Chronic Issues     HPI: Patient is a 63 y.o. male seen in the office today to follow up blood pressure. Was started on coreg and then increased to twice daily. Tolerating medications.no dizziness or feeling of being lightheaded.No side effects noted from medication. Shortness of breath with increase in activity.   Following with surgery due to abdominal hernia after colostomy reversal. Has a call into [facility] office to schedule surgery.   Noted to have Headaches CT scan done in august which showed-- 1. No acute abnormality. 2. Stable atrophy. 3. Progressive chronic small vessel white matter ischemic changes. Having these headaches once-twice a week. Reports it is not major but he is aware. Able to do daily activity. Lasting 5-10 mins and then will resolve without medications.   Stopped getting vit d in dialysis which he has been getting from the beginning. Plans to ask them about this tomorrow.   conts dialysis Tuesday, Thursday, Saturday   Review of Systems:  Review of Systems  Constitutional: Negative for activity change, appetite change, fatigue and unexpected weight change.  HENT: Negative for congestion and hearing loss.   Eyes: Negative.   Respiratory: Negative for cough and shortness of breath.   Cardiovascular: Negative for chest pain, palpitations and leg swelling.  Gastrointestinal: Negative for abdominal pain, diarrhea and constipation.  Genitourinary: Negative for difficulty urinating.       ESRD, on dialysis Tuesday, Thursday, Saturday   Musculoskeletal: Negative for myalgias and arthralgias.  Skin: Negative for color change and wound.  Neurological: Positive for headaches. Negative for dizziness and weakness.  Psychiatric/Behavioral: Negative for behavioral problems,  confusion and agitation.    Past Medical History  Diagnosis Date  . HIV (human immunodeficiency virus infection)   . Sepsis(995.91)   . Gout   . Hepatitis C   . Fournier's gangrene   . Anemia   . Severe protein-calorie malnutrition   . Alcohol abuse   . Dysrhythmia     AFib, not a candidate for coumadin , dr [name] signed off on pt  . Hypertension   . Colostomy status   . Sebaceous cyst   . Cough   . Unspecified constipation   . Gout, unspecified   . GERD (gastroesophageal reflux disease)   . Atrial fibrillation   . Ulcer of lower limb, unspecified   . Ulcer of lower limb, unspecified   . Human immunodeficiency virus (HIV) disease   . Other severe protein-calorie malnutrition   . Disorders of phosphorus metabolism   . Hypopotassemia   . Anemia in chronic kidney disease(285.21)   . Anemia in chronic kidney disease(285.21)   . Anemia, unspecified   . Hepatitis, unspecified   . Altered mental status   . Hemoptysis, unspecified   . Acute respiratory failure   . Gangrene   . Sepsis(995.91)   . Peripheral vascular disease, unspecified     denies  . End stage renal disease     hemodialysis tues, thurs, sat-east fornesia  . End stage renal disease   . Arthritis     gout   Past Surgical History  Procedure Laterality Date  . Cystoscopy with urethral dilatation  02/17/2012    Procedure: CYSTOSCOPY WITH URETHRAL DILATATION;  Surgeon: Reece Packer, MD;  Location: [facility];  Service: Urology;  Laterality: N/A;  retrograde urethragram, placement of foley catheter exam under anesthesia   . Insertion of dialysis catheter  02/20/2012    Procedure: INSERTION OF DIALYSIS CATHETER;  Surgeon: Angelia Mould, MD;  Location: [facility] OR;  Service: Vascular;  Laterality: N/A;  . Irrigation and debridement abscess  02/29/2012    Procedure: IRRIGATION AND DEBRIDEMENT ABSCESS;  Surgeon: Reece Packer, MD;  Location: [place];  Service: Urology;  Laterality: N/A;  I&D of scrotum abcess    . Insertion of dialysis catheter  03/29/2012    Procedure: INSERTION OF DIALYSIS CATHETER;  Surgeon: Rosetta Posner, MD;  Location: [facility] OR;  Service: Vascular;  Laterality: N/A;  Insertion tunneled dialysis catheter right IJ  . Av fistula placement  04/12/2012    Procedure: INSERTION OF ARTERIOVENOUS (AV) GORE-TEX GRAFT ARM;  Surgeon: Rosetta Posner, MD;  Location: [facility] OR;  Service: Vascular;  Laterality: Left;  insertion of 6x50 stretch goretex graft   . Thrombectomy and revision of arterioventous (av) goretex  graft  07/15/2012    Procedure: THROMBECTOMY AND REVISION OF ARTERIOVENTOUS (AV) GORETEX  GRAFT;  Surgeon: Elam Dutch, MD;  Location: [place];  Service: Vascular;  Laterality: Left;  . Urethrogram  07/30/2012    Procedure: URETHROGRAM;  Surgeon: Reece Packer, MD;  Location: [facility];  Service: Urology;  Laterality: N/A;  cystogram  . Cystoscopy  07/30/2012    Procedure: CYSTOSCOPY;  Surgeon: Reece Packer, MD;  Location: [facility];  Service: Urology;  Laterality: Bilateral;  retrograde  . Insertion of dialysis catheter  08/04/2012    Procedure: INSERTION OF DIALYSIS CATHETER;  Surgeon: Angelia Mould, MD;  Location: [place];  Service: Vascular;  Laterality: Right;  right intrajugular hemodialysis catheter insertion  . Av fistula placement  08/14/2012    Procedure: INSERTION OF ARTERIOVENOUS (AV) GORE-TEX GRAFT ARM;  Surgeon: Mal Misty, MD;  Location: [place];  Service: Vascular;  Laterality: Left;  . Colostomy  02/2012    d/t an infection  . Av fistula placement Right 11/08/2012    Procedure: ARTERIOVENOUS (AV) FISTULA CREATION;  Surgeon: Angelia Mould, MD;  Location: [facility] OR;  Service: Vascular;  Laterality: Right;  . Hernia repair      Umbilical  . Surgery for fournier's gangrene  02/2012  . Colon surgery  13    colostomy  . Colostomy closure  02/21/2013  . Colostomy closure N/A 02/21/2013    Procedure: COLOSTOMY CLOSURE;  Surgeon: [name], MD;  Location: [place];  Service: General;  Laterality: N/A;  takedown of sigmoid colostomy with anastomosis  . Bowel resection N/A 02/21/2013    Procedure: SMALL BOWEL RESECTION;  Surgeon: [name], MD;  Location: [place];  Service: General;  Laterality: N/A;  . Fistulogram Right 01/06/2013    Procedure: FISTULOGRAM;  Surgeon: Angelia Mould, MD;  Location: [facility] C[facility];  Service: Cardiovascular;  Laterality: Right;   Social History:   reports that he has never smoked. He has never used smokeless tobacco. He reports that he does not drink alcohol or use illicit drugs.  Family History  Problem Relation Age of Onset  . Hypertension Mother   . Hypertension Father   . Cancer Cousin     breast    Medications: Patient's Medications  New Prescriptions   No medications on file  Previous Medications   ALLOPURINOL (ZYLOPRIM) 300 MG TABLET    TAKE ONE TABLET BY MOUTH ONCE DAILY FOR  GOUT   AMINO ACIDS (LIQUACEL PO)    Take by mouth 3 (three) times a week. given to him at dialysis   ASPIRIN EC 325 MG TABLET    Take 1 tablet (325 mg total) by mouth daily.   CALCIUM ACETATE (PHOSLO) 667 MG CAPSULE    Take 667-1,334 mg by mouth 6 (six) times daily. Take 2 capsules by mouth 3 times daily with meals and 1 capsule by mouth 3 times daily with snacks.   CARVEDILOL (COREG) 6.25 MG TABLET    Take 1 tablet (6.25 mg total) by mouth 2 (two) times daily.   DARUNAVIR ETHANOLATE (PREZISTA) 800 MG TABLET    Take 1 tablet (800 mg total) by mouth daily with breakfast.   GABAPENTIN (NEURONTIN) 100 MG CAPSULE    Take 1 capsule (100 mg total) by mouth 3 (three) times daily.   ISENTRESS 400 MG TABLET    TAKE ONE TABLET BY MOUTH BY MOUTH TWICE DAILY   LAMIVUDINE (EPIVIR) 100 MG TABLET    TAKE 1/2 TABLET BY MOUTH EVERY DAY   LANTHANUM (FOSRENOL) 1000 MG CHEWABLE TABLET    Chew 1,000 mg by mouth 2 (two) times daily with a meal.   LEVOTHYROXINE (LEVOTHROID) 25 MCG TABLET    Take 1 tablet (25 mcg total) by mouth daily before  breakfast.   NORVIR 100 MG TABS TABLET    TAKE 1 TABLET BY MOUTH EVERY DAY   ZIDOVUDINE (RETROVIR) 300 MG TABLET    TAKE ONE TABLET BY MOUTH EVERY DAY  Modified Medications   No medications on file  Discontinued Medications   LEDIPASVIR-SOFOSBUVIR (HARVONI) 90-400 MG TABS    Take by mouth daily.     Physical Exam:  Filed Vitals:   08/31/14 1307  BP: 140/80  Pulse: 82  Temp: 97.7 F (36.5 C)  TempSrc: Oral  Resp: 20  Height: 5\' 11"  (1.803 m)  Weight: 230 lb 12.8 oz (104.69 kg)  SpO2: 93%    Physical Exam  Constitutional: He is oriented to person, place, and time. He appears well-developed and well-nourished. No distress.  HENT:  Head: Normocephalic and atraumatic.  Mouth/Throat: Oropharynx is clear and moist. No oropharyngeal exudate.  Eyes: Conjunctivae and EOM are normal. Pupils are equal, round, and reactive to light.  Neck: Normal range of motion. Neck supple.  Cardiovascular: Normal rate, regular rhythm and normal heart sounds.   Pulmonary/Chest: Effort normal and breath sounds normal.  Abdominal: Soft. Bowel sounds are normal. A hernia (easily reducable ) is present.  Musculoskeletal: He exhibits no edema or tenderness.  Neurological: He is alert and oriented to person, place, and time.  Skin: Skin is warm and dry. He is not diaphoretic.  Psychiatric: He has a normal mood and affect.    Labs reviewed: Basic Metabolic Panel:  Recent Labs  03/25/14 1109 06/24/14 0932 07/20/14 0932  NA 140 137 139  K 4.0 3.7 4.9  CL 100 94* 100  CO2 26 23 24   GLUCOSE 94 87 81  BUN 41* 24 28*  CREATININE 9.33* 6.25* 7.23*  CALCIUM 9.6 10.1 10.3  TSH  --  6.860*  --    Liver Function Tests:  Recent Labs  03/25/14 1109 06/24/14 0932 07/20/14 0932  AST 20 19 15   ALT 24 17 12   ALKPHOS 74 85 76  BILITOT 0.8 0.4 0.4  PROT 7.3 8.0 7.4  ALBUMIN 4.1  --  3.7   No results for input(s): LIPASE, AMYLASE in the last 8760 hours. No results  for input(s): AMMONIA in the last  8760 hours. CBC:  Recent Labs  11/10/13 1025 03/25/14 1109 06/24/14 0932 07/20/14 0932  WBC 3.5* 4.4 6.5 4.9  NEUTROABS 1.8 2.4 3.8 2.5  HGB 10.8* 11.1* 11.0* 11.1*  HCT 31.9* 32.7* 34.2* 33.7*  MCV 106.3* 105.8* 109* 106.3*  PLT 211 209  --  217   Lipid Panel:  Recent Labs  03/16/14 0829 06/24/14 0932  HDL 44 44  LDLCALC 67 100*  TRIG 116 113  CHOLHDL 3.0 3.8   TSH:  Recent Labs  06/24/14 0932  TSH 6.860*   A1C: No results found for: HGBA1C   Assessment/Plan 1. Essential hypertension -blood pressures reviewed. Sbp mostly <150. Pt to cont to monitor and take medications as prescribed.   2. Hypothyroidism, unspecified hypothyroidism type Currently taking synthroid as prescribed. Will follow up TSH today  3. Abdominal hernia without obstruction and without gangrene, recurrence not specified, unspecified hernia type -following with general surgery, plans to have hernia repair  4. Acute nonintractable headache, unspecified headache type Comes and goes, no visual disturbances. Offered neurology referral but would like to wait at this time. Plans to pay closer attention to when these headaches occur.  If headaches gets worse to return sooner.    Follow up in 3 months or sooner if sbp staying greater than 150, worsening of Headache or after hernia repair.

## 2014-08-31 NOTE — Patient Instructions (Signed)
Cont all medications  Follow up in 3 months Follow up sooner if blood pressure remains over 150/90 or headaches worsen or after surgery

## 2014-09-01 LAB — TSH: TSH: 6.19 u[IU]/mL — AB (ref 0.450–4.500)

## 2014-09-02 ENCOUNTER — Other Ambulatory Visit: Payer: Self-pay | Admitting: *Deleted

## 2014-09-02 MED ORDER — LEVOTHYROXINE SODIUM 50 MCG PO TABS: 50.0000 ug | ORAL_TABLET | Freq: Every day | ORAL | Status: DC

## 2014-09-14 ENCOUNTER — Other Ambulatory Visit: Payer: Self-pay | Admitting: Nurse Practitioner

## 2014-09-23 ENCOUNTER — Encounter: Payer: Self-pay | Admitting: Internal Medicine

## 2014-09-23 ENCOUNTER — Ambulatory Visit (INDEPENDENT_AMBULATORY_CARE_PROVIDER_SITE_OTHER): Payer: BC Managed Care – PPO | Admitting: Internal Medicine

## 2014-09-23 VITALS — BP 155/100 | HR 75 | Temp 98.2°F | Wt 228.0 lb

## 2014-09-23 DIAGNOSIS — I10 Essential (primary) hypertension: Secondary | ICD-10-CM

## 2014-09-23 DIAGNOSIS — Z21 Asymptomatic human immunodeficiency virus [HIV] infection status: Secondary | ICD-10-CM | POA: Diagnosis not present

## 2014-09-23 DIAGNOSIS — K469 Unspecified abdominal hernia without obstruction or gangrene: Secondary | ICD-10-CM

## 2014-09-23 DIAGNOSIS — K439 Ventral hernia without obstruction or gangrene: Secondary | ICD-10-CM | POA: Diagnosis not present

## 2014-09-23 DIAGNOSIS — B182 Chronic viral hepatitis C: Secondary | ICD-10-CM | POA: Diagnosis not present

## 2014-09-23 NOTE — Progress Notes (Signed)
Patient ID: Bryan Gilmore, male   DOB: 01-15-1951, 64 y.o.   MRN: [id number] HPI: Bryan Gilmore is a 64 y.o. male co-infected who is here for his routine follow up.   Lab Results  Component Value Date   HCVGENOTYPE 1b 10/07/2010    Allergies: No Known Allergies  Vitals: Temp: 98.2 F (36.8 C) (01/13 0928) Temp Source: Oral (01/13 0928) BP: 155/100 mmHg (01/13 0928) Pulse Rate: 75 (01/13 5625)  Past Medical History: Past Medical History  Diagnosis Date  . HIV (human immunodeficiency virus infection)   . Sepsis(995.91)   . Gout   . Hepatitis C   . Fournier's gangrene   . Anemia   . Severe protein-calorie malnutrition   . Alcohol abuse   . Dysrhythmia     AFib, not a candidate for coumadin , dr [name] signed off on pt  . Hypertension   . Colostomy status   . Sebaceous cyst   . Cough   . Unspecified constipation   . Gout, unspecified   . GERD (gastroesophageal reflux disease)   . Atrial fibrillation   . Ulcer of lower limb, unspecified   . Ulcer of lower limb, unspecified   . Human immunodeficiency virus (HIV) disease   . Other severe protein-calorie malnutrition   . Disorders of phosphorus metabolism   . Hypopotassemia   . Anemia in chronic kidney disease(285.21)   . Anemia in chronic kidney disease(285.21)   . Anemia, unspecified   . Hepatitis, unspecified   . Altered mental status   . Hemoptysis, unspecified   . Acute respiratory failure   . Gangrene   . Sepsis(995.91)   . Peripheral vascular disease, unspecified     denies  . End stage renal disease     hemodialysis tues, thurs, sat-east fornesia  . End stage renal disease   . Arthritis     gout    Social History: History   Social History  . Marital Status: Legally Separated    Spouse Name: N/A    Number of Children: 0  . Years of Education: N/A   Occupational History  . [employer]   Social History Main Topics  . Smoking status: Never Smoker   . Smokeless tobacco: Never  Used  . Alcohol Use: No  . Drug Use: No  . Sexual Activity: No     Comment: declined condoms   Other Topics Concern  . None   Social History Narrative   Single.  Unemployed.  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      Labs: HIV 1 RNA QUANT (copies/mL)  Date Value  07/20/2014 <20  03/25/2014 <20  11/10/2013 <20   CD4 T CELL ABS (/uL)  Date Value  07/20/2014 650  03/25/2014 510  11/10/2013 360*   HEP B S AB (no units)  Date Value  02/18/2012 NEGATIVE   HEPATITIS B SURFACE AG (no units)  Date Value  04/11/2012 NEGATIVE   HCV AB (no units)  Date Value  09/20/2010 REACTIVE*    Lab Results  Component Value Date   HCVGENOTYPE 1b 10/07/2010    Hepatitis C RNA quantitative Latest Ref Rng 08/10/2014 07/20/2014 06/22/2014 12/24/2013 02/05/2013  HCV Quantitative <15 IU/mL Not Detected Not Detected <15 3031346(H) 6389373(S)  HCV Quantitative Log <1.18 log 10 NOT CALC NOT CALC <1.18 6.48(H) 6.87(H)    AST  Date Value  07/20/2014 15 U/L  06/24/2014 19 IU/L  03/25/2014 20 U/L   ALT  Date Value  07/20/2014 12 U/L  06/24/2014 17 IU/L  03/25/2014 24 U/L   INR (no units)  Date Value  08/27/2013 0.96  02/19/2013 1.00  08/14/2012 1.00    CrCl: CrCl cannot be calculated (Patient has no serum creatinine result on file.).  Fibrosis Score: F4 as assessed by ARFI  Child-Pugh Score: A  Previous Treatment Regimen: Naive  Assessment: Pt completed his hep C treatment around the 1 week of December. He stated that he did not miss a single dose. His first two hep C viral loads were not detected. We'll bring him back in 3 months to confirm SVR12 cure status. He had no issues with therapy.   Recommendations: F/u with [place], [place], [place].D., BCPS, AAHIVP Clinical Infectious [place] for Infectious Disease 09/23/2014, 11:07 AM

## 2014-09-23 NOTE — Progress Notes (Signed)
Patient ID: Bryan Gilmore, male   DOB: 07-Apr-1951, 64 y.o.   MRN: [id number]       Patient ID: Bryan Gilmore, male   DOB: 10-Nov-1950, 64 y.o.   MRN: [id number]  HPI 64yo M with HIV-HCV, ERSD on HD,  HTN. Abdominal wall hernia. He has finished 12 wk course of harvoni in early December. CD 4 count of 650/VL<20 on RLG, DRVr, Lamivudine/zidovudine. Doing well overall except from abdominal discomfort with coughing from abdominal hernia. He is wondering how to be evaluated again for hernia repair surgery. Last seen by dr. Excell Seltzer in the late summer  Outpatient Encounter Prescriptions as of 09/23/2014  Medication Sig  . Darunavir Ethanolate (PREZISTA) 800 MG tablet Take 1 tablet (800 mg total) by mouth daily with breakfast.  . ISENTRESS 400 MG tablet TAKE ONE TABLET BY MOUTH BY MOUTH TWICE DAILY  . lamivudine (EPIVIR) 100 MG tablet TAKE 1/2 TABLET BY MOUTH EVERY DAY  . NORVIR 100 MG TABS tablet TAKE 1 TABLET BY MOUTH EVERY DAY  . zidovudine (RETROVIR) 300 MG tablet TAKE ONE TABLET BY MOUTH EVERY DAY  . allopurinol (ZYLOPRIM) 300 MG tablet TAKE ONE TABLET BY MOUTH ONCE DAILY FOR GOUT  . Amino Acids (LIQUACEL PO) Take by mouth 3 (three) times a week. given to him at dialysis  . aspirin EC 325 MG tablet Take 1 tablet (325 mg total) by mouth daily.  . calcium acetate (PHOSLO) 667 MG capsule Take 667-1,334 mg by mouth 6 (six) times daily. Take 2 capsules by mouth 3 times daily with meals and 1 capsule by mouth 3 times daily with snacks.  . carvedilol (COREG) 6.25 MG tablet Take 1 tablet (6.25 mg total) by mouth 2 (two) times daily.  Marland Kitchen gabapentin (NEURONTIN) 100 MG capsule TAKE ONE CAPSULE BY MOUTH THREE TIMES DAILY  . lanthanum (FOSRENOL) 1000 MG chewable tablet Chew 1,000 mg by mouth 2 (two) times daily with a meal.  . levothyroxine (SYNTHROID, LEVOTHROID) 50 MCG tablet Take 1 tablet (50 mcg total) by mouth daily.  . [DISCONTINUED] carvedilol (COREG) 3.125 MG tablet Take 1 tablet (3.125 mg total) by mouth  2 (two) times daily.  . [DISCONTINUED] gabapentin (NEURONTIN) 100 MG capsule Take 1 capsule (100 mg total) by mouth 3 (three) times daily.  . [DISCONTINUED] Ledipasvir-Sofosbuvir (HARVONI) 90-400 MG TABS Take by mouth daily.  . [DISCONTINUED] levothyroxine (LEVOTHROID) 25 MCG tablet Take 1 tablet (25 mcg total) by mouth daily before breakfast.     Patient Active Problem List   Diagnosis Date Noted  . Thyroid activity decreased 07/29/2014  . Essential hypertension 06/24/2014  . Floaters in visual field 06/24/2014  . Atrial fibrillation, unspecified 06/24/2014  . Other specified abdominal hernia without obstruction or gangrene 06/24/2014  . Esophageal reflux 06/24/2014  . Annual physical exam 06/24/2014  . Gynecomastia 11/12/2013  . Other complications due to renal dialysis device, implant, and graft 01/29/2013  . Colostomy status 12/26/2012  . Aftercare following surgery of the circulatory system, [place] 12/18/2012  . End stage renal disease 10/30/2012  . Metabolic encephalopathy 82/50/0370  . Alcohol abuse   . Atrial fibrillation, permanent 04/04/2012  . Fever 04/01/2012  . Delirium 04/01/2012  . Hyperkalemia 03/29/2012  . Secondary hyperparathyroidism (of renal origin) 03/29/2012  . ESRD (end stage renal disease) 02/29/2012  . Anemia of chronic renal failure 02/29/2012  . Normocytic anemia 02/17/2012  . Acute hepatitis C virus infection 10/07/2010  . Chronic gouty arthropathy 10/07/2010  . 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  Human immunodeficiency virus (HIV) disease 09/15/2010     Health Maintenance Due  Topic Date Due  . COLONOSCOPY  11/18/2000  . ZOSTAVAX  11/19/2010     Review of Systems  Physical Exam   BP 155/100 mmHg  Pulse 75  Temp(Src) 98.2 F (36.8 C) (Oral)  Wt 228 lb (103.42 kg)  Lab Results  Component Value Date   CD4TCELL 34 07/20/2014   Lab Results  Component Value Date   CD4TABS 650 07/20/2014   CD4TABS 510 03/25/2014   CD4TABS 360*  11/10/2013   Lab Results  Component Value Date   HIV1RNAQUANT <20 07/20/2014   Lab Results  Component Value Date   HEPBSAB NEGATIVE 02/18/2012   No results found for: RPR  CBC Lab Results  Component Value Date   WBC 4.9 07/20/2014   RBC 3.17* 07/20/2014   HGB 11.1* 07/20/2014   HCT 33.7* 07/20/2014   PLT 217 07/20/2014   MCV 106.3* 07/20/2014   MCH 35.0* 07/20/2014   MCHC 32.9 07/20/2014   RDW 14.5 07/20/2014   LYMPHSABS 1.8 07/20/2014   MONOABS 0.5 07/20/2014   EOSABS 0.1 07/20/2014   BASOSABS 0.0 07/20/2014   BMET Lab Results  Component Value Date   NA 139 07/20/2014   K 4.9 07/20/2014   CL 100 07/20/2014   CO2 24 07/20/2014   GLUCOSE 81 07/20/2014   BUN 28* 07/20/2014   CREATININE 7.23* 07/20/2014   CALCIUM 10.3 07/20/2014   GFRNONAA 7* 07/20/2014   GFRAA 8* 07/20/2014     Assessment and Plan  abd hernia = will refer back to [facility] for eval/surgical repair of abdominal hernia. He has been cured of hepatitis C and has well controlled hiv disease. Gave rx for abdominal binder  hiv = well controlled, continue on current regimen  hcv = has been cured, since viral load is undetectable. He has finished his course of therapy. He will have repeat blood test at beginning of [facility] for [facility]  Cirrhosis = will repeat ultrasound within the next 6 months  htn = gets dialyzed tomorrow

## 2014-09-24 ENCOUNTER — Encounter (HOSPITAL_COMMUNITY): Payer: Self-pay | Admitting: Vascular Surgery

## 2014-10-06 NOTE — Pre-Procedure Instructions (Signed)
[name].  10/06/2014   Your procedure is scheduled on:  Monday, February 1st  Report to [place] at 0530 AM.  Call this number if you have problems the morning of surgery: [phone]   Remember:   Do not eat food or drink liquids after midnight.   Take these medicines the morning of surgery with A SIP OF WATER: coreg, neurontin, synthroid, prezista, epivir, norvir, retrovir, isentress   Do not wear jewelry  Do not wear lotions, powders, or perfume,deodorant.  Do not shave 48 hours prior to surgery. Men may shave face and neck.  Do not bring valuables to the hospital.  [facility] is not responsible  for any belongings or valuables.               Contacts, dentures or bridgework may not be worn into surgery.  Leave suitcase in the car. After surgery it may be brought to your room.  For patients admitted to the hospital, discharge time is determined by your   treatment team.               Patients discharged the day of surgery will not be allowed to drive home.  Please read over the following fact sheets that you were given: Pain Booklet, Coughing and Deep Breathing and Surgical Site Infection Prevention  [place] - Preparing for Surgery  Before surgery, you can play an important role.  Because skin is not sterile, your skin needs to be as free of germs as possible.  You can reduce the number of germs on you skin by washing with CHG (chlorahexidine gluconate) soap before surgery.  CHG is an antiseptic cleaner which kills germs and bonds with the skin to continue killing germs even after washing.  Please DO NOT use if you have an allergy to CHG or antibacterial soaps.  If your skin becomes reddened/irritated stop using the CHG and inform your nurse when you arrive at Short Stay.  Do not shave (including legs and underarms) for at least 48 hours prior to the first CHG shower.  You may shave your face.  Please follow these instructions carefully:   1.   Shower with CHG Soap the night before surgery and the morning of Surgery.  2.  If you choose to wash your hair, wash your hair first as usual with your normal shampoo.  3.  After you shampoo, rinse your hair and body thoroughly to remove the shampoo.  4.  Use CHG as you would any other liquid soap.  You can apply CHG directly to the skin and wash gently with scrungie or a clean washcloth.  5.  Apply the CHG Soap to your body ONLY FROM THE NECK DOWN.  Do not use on open wounds or open sores.  Avoid contact with your eyes, ears, mouth and genitals (private parts).  Wash genitals (private parts) with your normal soap.  6.  Wash thoroughly, paying special attention to the area where your surgery will be performed.  7.  Thoroughly rinse your body with warm water from the neck down.  8.  DO NOT shower/wash with your normal soap after using and rinsing off the CHG Soap.  9.  Pat yourself dry with a clean towel.            10.  Wear clean pajamas.            11.  Place clean sheets on your bed the night of your first  shower and do not sleep with pets.  Day of Surgery  Do not apply any lotions/deoderants the morning of surgery.  Please wear clean clothes to the hospital/surgery center.

## 2014-10-07 ENCOUNTER — Encounter (HOSPITAL_COMMUNITY)
Admission: RE | Admit: 2014-10-07 | Discharge: 2014-10-07 | Disposition: A | Discharge status: Home / Self Care | Payer: BC Managed Care – PPO | Source: Ambulatory Visit | Attending: General Surgery | Admitting: General Surgery

## 2014-10-07 ENCOUNTER — Encounter (HOSPITAL_COMMUNITY): Payer: Self-pay

## 2014-10-07 ENCOUNTER — Other Ambulatory Visit (INDEPENDENT_AMBULATORY_CARE_PROVIDER_SITE_OTHER): Payer: Self-pay | Admitting: General Surgery

## 2014-10-07 DIAGNOSIS — K219 Gastro-esophageal reflux disease without esophagitis: Secondary | ICD-10-CM | POA: Diagnosis not present

## 2014-10-07 DIAGNOSIS — K432 Incisional hernia without obstruction or gangrene: Secondary | ICD-10-CM | POA: Diagnosis not present

## 2014-10-07 DIAGNOSIS — Z01812 Encounter for preprocedural laboratory examination: Secondary | ICD-10-CM | POA: Diagnosis not present

## 2014-10-07 DIAGNOSIS — I120 Hypertensive chronic kidney disease with stage 5 chronic kidney disease or end stage renal disease: Secondary | ICD-10-CM | POA: Insufficient documentation

## 2014-10-07 DIAGNOSIS — N186 End stage renal disease: Secondary | ICD-10-CM | POA: Insufficient documentation

## 2014-10-07 DIAGNOSIS — I482 Chronic atrial fibrillation: Secondary | ICD-10-CM | POA: Diagnosis not present

## 2014-10-07 DIAGNOSIS — E039 Hypothyroidism, unspecified: Secondary | ICD-10-CM | POA: Diagnosis not present

## 2014-10-07 HISTORY — DX: Hypothyroidism, unspecified: E03.9

## 2014-10-07 LAB — COMPREHENSIVE METABOLIC PANEL
ALBUMIN: 3.9 g/dL (ref 3.5–5.2)
ALK PHOS: 74 U/L (ref 39–117)
ALT: 14 U/L (ref 0–53)
ANION GAP: 13 (ref 5–15)
AST: 17 U/L (ref 0–37)
BILIRUBIN TOTAL: 0.8 mg/dL (ref 0.3–1.2)
BUN: 32 mg/dL — AB (ref 6–23)
CALCIUM: 9.7 mg/dL (ref 8.4–10.5)
CO2: 29 mmol/L (ref 19–32)
CREATININE: 8.3 mg/dL — AB (ref 0.50–1.35)
Chloride: 94 mmol/L — ABNORMAL LOW (ref 96–112)
GFR calc non Af Amer: 6 mL/min — ABNORMAL LOW (ref >90)
GFR, EST AFRICAN AMERICAN: 7 mL/min — AB (ref >90)
Glucose, Bld: 124 mg/dL — ABNORMAL HIGH (ref 70–99)
Potassium: 3.9 mmol/L (ref 3.5–5.1)
SODIUM: 136 mmol/L (ref 135–145)
Total Protein: 8.6 g/dL — ABNORMAL HIGH (ref 6.0–8.3)

## 2014-10-07 LAB — SURGICAL PCR SCREEN
MRSA, PCR: NEGATIVE
Staphylococcus aureus: NEGATIVE

## 2014-10-07 LAB — CBC
HCT: 34.6 % — ABNORMAL LOW (ref 39.0–52.0)
Hemoglobin: 11.9 g/dL — ABNORMAL LOW (ref 13.0–17.0)
MCH: 35.5 pg — ABNORMAL HIGH (ref 26.0–34.0)
MCHC: 34.4 g/dL (ref 30.0–36.0)
MCV: 103.3 fL — AB (ref 78.0–100.0)
Platelets: 167 10*3/uL (ref 150–400)
RBC: 3.35 MIL/uL — ABNORMAL LOW (ref 4.22–5.81)
RDW: 12.7 % (ref 11.5–15.5)
WBC: 5.7 10*3/uL (ref 4.0–10.5)

## 2014-10-07 NOTE — Progress Notes (Signed)
PCP is Dr Bubba Camp at [facility] Cardiologist is Dr Johnsie Cancel Echo noted in epic from 2013

## 2014-10-08 NOTE — Progress Notes (Signed)
Anesthesia Chart Review:  Patient is a 64 year old male scheduled for repair of ventral incisional hernia on 10/12/14 by Dr. Excell Seltzer.  History includes HIV, Hepatitis C s/p 12 week treatment with Harvoni 08/2014, ETOH, HTN, ESRD on HD (RUE AVF), hypothyroidism, chronic afib diagnosed in Dec 11, 2011 (not felt to be a candidate for anticoagulation therapy due to multiple co-morbidities; [place] VASC 1 06/2014), GERD, sepsis with Fournier's gangrene and loss of bulbar urethrae s/p extensive surgery including I&D of peroneal and perirectal abscess and later a diverting colostomy '13, small bowel resection with colostomy closure '14, anemia.  PCP is Dr. Bubba Camp at [facility]. ID is Dr. Baxter Flattery, last seen 09/23/14 with referral to [facility] for hernia repair.  She states his hepatitis C is cured and HIV is stable. Nephrologist is Dr. Moshe Cipro. Cardiologist is Dr. [name] with last visit with Dr. Johnsie Cancel on 07/01/14.  Meds include ASA, Coreg, gabapentin, Isentress, Epivir, Fosrenol, levothyroxine, Norvir, Retrovir.   07/01/14 EKG: afib, rightward axis, ST/T wave abnormality, consider inferior ischemia or digitalis effect, prolonged QT. (EKG already reviewed by Dr. Johnsie Cancel.  No new testing ordered.) Patient has had inferolateral ST/T wave abnormality on previous EKGs from 08/04/12 and 07/06/13--most pronounced on his 12/11/2011 EKG. HR was 76 bpm at PAT.   04/04/12 Echo: - Left ventricle: The cavity size was normal. Wall thickness was increased in a pattern of moderate LVH. Systolic function was normal. The estimated ejection fraction was in the range of 55% to 60%. Indeterminant diastolic function. Wall motion was normal; there were no regional wall motion abnormalities. - Aortic valve: There was no stenosis. - Mitral valve: No significant regurgitation. - Left atrium: The atrium was mildly dilated. - Right ventricle: The cavity size was normal. Systolic function was normal. - Right atrium: The atrium was  mildly dilated. - Pulmonary arteries: No complete TR doppler jet so unable to estimate PA systolic pressure. - Inferior vena cava: The vessel was normal in size; the respirophasic diameter changes were in the normal range (= 50%); findings are consistent with normal central venous pressure.  Preoperative labs noted.  He will get an ISTAT4 on arrival.   Patient with cardiology follow-up within the past 4 months with no new testing ordered.  He has known chronic afib since 12/11/11, and has underdone numerous surgical procedures since.  Cardiology is not recommending anticoagulation therapy at this time, and he remains rate controlled on b-blocker therapy.  He has also had PCP and ID follow-up within the past three months. If no acute changes and same day labs are acceptable then I would anticipate that he could proceed as planned.    Bryan Gilmore [facility] Short Stay Center/Anesthesiology Phone [phone] 10/08/2014 11:15 AM

## 2014-10-11 DIAGNOSIS — Z992 Dependence on renal dialysis: Secondary | ICD-10-CM | POA: Diagnosis not present

## 2014-10-11 DIAGNOSIS — N186 End stage renal disease: Secondary | ICD-10-CM | POA: Diagnosis not present

## 2014-10-11 MED ORDER — CEFAZOLIN SODIUM-DEXTROSE 2-3 GM-% IV SOLR
2.0000 g | INTRAVENOUS | Status: AC
  Administered 2014-10-12: 2 g via INTRAVENOUS
  Filled 2014-10-11: qty 50

## 2014-10-11 NOTE — Anesthesia Preprocedure Evaluation (Addendum)
Anesthesia Evaluation  Patient identified by MRN, date of birth, ID band Patient awake    Reviewed: Allergy & Precautions, NPO status , Patient's Chart, lab work & pertinent test results, reviewed documented beta blocker date and time   Airway Mallampati: II       Dental  (+) Poor Dentition, Missing, Dental Advisory Given   Pulmonary  breath sounds clear to auscultation        Cardiovascular hypertension, Pt. 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, followed regularly by cardio, AF not a candidate for anti coagulation   Neuro/Psych    GI/Hepatic GERD-  Medicated,(+)     substance abuse  alcohol use, Hepatitis -, C  Endo/Other  Hypothyroidism   Renal/GU DialysisRenal disease     Musculoskeletal   Abdominal (+) + obese,   Peds  Hematology  (+) HIV, 12/35 H/H   Anesthesia Other Findings   Reproductive/Obstetrics                            Anesthesia Physical Anesthesia Plan  ASA: III  Anesthesia Plan: General   Post-op Pain Management:    Induction: Intravenous  Airway Management Planned: Oral ETT  Additional Equipment:   Intra-op Plan:   Post-operative Plan: Extubation in OR  Informed Consent: I have reviewed the patients History and Physical, chart, labs and discussed the procedure including the risks, benefits and alternatives for the proposed anesthesia with the patient or authorized representative who has indicated his/her understanding and acceptance.     Plan Discussed with:   Anesthesia Plan Comments:         Anesthesia Quick Evaluation

## 2014-10-12 ENCOUNTER — Encounter (HOSPITAL_COMMUNITY): Payer: Self-pay | Admitting: *Deleted

## 2014-10-12 ENCOUNTER — Inpatient Hospital Stay (HOSPITAL_COMMUNITY): Payer: BC Managed Care – PPO | Admitting: Vascular Surgery

## 2014-10-12 ENCOUNTER — Encounter (HOSPITAL_COMMUNITY)
Admission: RE | Disposition: A | Discharge status: Home / Self Care | Payer: Self-pay | Source: Ambulatory Visit | Attending: General Surgery

## 2014-10-12 ENCOUNTER — Inpatient Hospital Stay (HOSPITAL_COMMUNITY)
Admission: RE | Admit: 2014-10-12 | Discharge: 2014-10-16 | DRG: 353 | Disposition: A | Discharge status: Home / Self Care | Payer: BC Managed Care – PPO | Source: Ambulatory Visit | Attending: General Surgery | Admitting: General Surgery

## 2014-10-12 ENCOUNTER — Inpatient Hospital Stay (HOSPITAL_COMMUNITY): Payer: BC Managed Care – PPO | Admitting: Anesthesiology

## 2014-10-12 DIAGNOSIS — Z992 Dependence on renal dialysis: Secondary | ICD-10-CM

## 2014-10-12 DIAGNOSIS — Z933 Colostomy status: Secondary | ICD-10-CM | POA: Diagnosis not present

## 2014-10-12 DIAGNOSIS — I4891 Unspecified atrial fibrillation: Secondary | ICD-10-CM | POA: Diagnosis present

## 2014-10-12 DIAGNOSIS — N186 End stage renal disease: Secondary | ICD-10-CM | POA: Diagnosis present

## 2014-10-12 DIAGNOSIS — Z9049 Acquired absence of other specified parts of digestive tract: Secondary | ICD-10-CM | POA: Diagnosis present

## 2014-10-12 DIAGNOSIS — E039 Hypothyroidism, unspecified: Secondary | ICD-10-CM | POA: Diagnosis present

## 2014-10-12 DIAGNOSIS — N2581 Secondary hyperparathyroidism of renal origin: Secondary | ICD-10-CM | POA: Diagnosis present

## 2014-10-12 DIAGNOSIS — K660 Peritoneal adhesions (postprocedural) (postinfection): Secondary | ICD-10-CM | POA: Diagnosis present

## 2014-10-12 DIAGNOSIS — K432 Incisional hernia without obstruction or gangrene: Principal | ICD-10-CM | POA: Diagnosis present

## 2014-10-12 DIAGNOSIS — K219 Gastro-esophageal reflux disease without esophagitis: Secondary | ICD-10-CM | POA: Diagnosis not present

## 2014-10-12 DIAGNOSIS — Z7982 Long term (current) use of aspirin: Secondary | ICD-10-CM | POA: Diagnosis not present

## 2014-10-12 DIAGNOSIS — I739 Peripheral vascular disease, unspecified: Secondary | ICD-10-CM | POA: Diagnosis present

## 2014-10-12 DIAGNOSIS — I120 Hypertensive chronic kidney disease with stage 5 chronic kidney disease or end stage renal disease: Secondary | ICD-10-CM | POA: Diagnosis present

## 2014-10-12 DIAGNOSIS — D631 Anemia in chronic kidney disease: Secondary | ICD-10-CM | POA: Diagnosis present

## 2014-10-12 DIAGNOSIS — M109 Gout, unspecified: Secondary | ICD-10-CM | POA: Diagnosis present

## 2014-10-12 DIAGNOSIS — M1990 Unspecified osteoarthritis, unspecified site: Secondary | ICD-10-CM | POA: Diagnosis present

## 2014-10-12 DIAGNOSIS — B1920 Unspecified viral hepatitis C without hepatic coma: Secondary | ICD-10-CM | POA: Diagnosis present

## 2014-10-12 HISTORY — DX: Dependence on renal dialysis: Z99.2

## 2014-10-12 HISTORY — DX: Acute myocardial infarction, unspecified: I21.9

## 2014-10-12 HISTORY — DX: Hepatitis a without hepatic coma: B15.9

## 2014-10-12 HISTORY — DX: Dependence on renal dialysis: N18.6

## 2014-10-12 HISTORY — PX: INSERTION OF MESH: SHX5868

## 2014-10-12 HISTORY — DX: Gout, unspecified: M10.9

## 2014-10-12 HISTORY — DX: Unspecified viral hepatitis B without hepatic coma: B19.10

## 2014-10-12 HISTORY — PX: VENTRAL HERNIA REPAIR: SHX424

## 2014-10-12 LAB — POCT I-STAT 4, (NA,K, GLUC, HGB,HCT)
Glucose, Bld: 92 mg/dL (ref 70–99)
HCT: 33 % — ABNORMAL LOW (ref 39.0–52.0)
Hemoglobin: 11.2 g/dL — ABNORMAL LOW (ref 13.0–17.0)
POTASSIUM: 4.1 mmol/L (ref 3.5–5.1)
SODIUM: 134 mmol/L — AB (ref 135–145)

## 2014-10-12 LAB — GLUCOSE, CAPILLARY
Glucose-Capillary: 95 mg/dL (ref 70–99)
Glucose-Capillary: 116 mg/dL — ABNORMAL HIGH (ref 70–99)

## 2014-10-12 SURGERY — REPAIR, HERNIA, VENTRAL
Anesthesia: General | Site: Abdomen

## 2014-10-12 MED ORDER — LAMIVUDINE 10 MG/ML PO SOLN
50.0000 mg | Freq: Every day | ORAL | Status: DC
  Administered 2014-10-12 – 2014-10-16 (×5): 50 mg via ORAL
  Filled 2014-10-12 (×6): qty 5

## 2014-10-12 MED ORDER — FENTANYL CITRATE 0.05 MG/ML IJ SOLN
INTRAMUSCULAR | Status: AC
  Administered 2014-10-12: 25 ug via INTRAVENOUS
  Filled 2014-10-12: qty 2

## 2014-10-12 MED ORDER — NEOSTIGMINE METHYLSULFATE 10 MG/10ML IV SOLN
INTRAVENOUS | Status: AC
  Filled 2014-10-12: qty 1

## 2014-10-12 MED ORDER — ROCURONIUM BROMIDE 50 MG/5ML IV SOLN
INTRAVENOUS | Status: AC
  Filled 2014-10-12: qty 1

## 2014-10-12 MED ORDER — MORPHINE SULFATE 2 MG/ML IJ SOLN
2.0000 mg | INTRAMUSCULAR | Status: DC | PRN
  Administered 2014-10-12: 2 mg via INTRAVENOUS
  Administered 2014-10-12 – 2014-10-13 (×4): 6 mg via INTRAVENOUS
  Filled 2014-10-12 (×4): qty 3

## 2014-10-12 MED ORDER — LAMIVUDINE 100 MG PO TABS: 50.0000 mg | ORAL_TABLET | Freq: Every day | ORAL | Status: DC

## 2014-10-12 MED ORDER — 0.9 % SODIUM CHLORIDE (POUR BTL) OPTIME
TOPICAL | Status: DC | PRN
  Administered 2014-10-12 (×2): 1000 mL

## 2014-10-12 MED ORDER — PROMETHAZINE HCL 25 MG/ML IJ SOLN: 6.2500 mg | INTRAMUSCULAR | Status: DC | PRN

## 2014-10-12 MED ORDER — DEXAMETHASONE SODIUM PHOSPHATE 4 MG/ML IJ SOLN
INTRAMUSCULAR | Status: DC | PRN
  Administered 2014-10-12: 8 mg via INTRAVENOUS

## 2014-10-12 MED ORDER — PHENYLEPHRINE 40 MCG/ML (10ML) SYRINGE FOR IV PUSH (FOR BLOOD PRESSURE SUPPORT)
PREFILLED_SYRINGE | INTRAVENOUS | Status: AC
  Filled 2014-10-12: qty 10

## 2014-10-12 MED ORDER — GLYCOPYRROLATE 0.2 MG/ML IJ SOLN
INTRAMUSCULAR | Status: DC | PRN
  Administered 2014-10-12: 0.4 mg via INTRAVENOUS

## 2014-10-12 MED ORDER — LIDOCAINE HCL (CARDIAC) 20 MG/ML IV SOLN
INTRAVENOUS | Status: DC | PRN
  Administered 2014-10-12: 100 mg via INTRAVENOUS

## 2014-10-12 MED ORDER — MEPERIDINE HCL 25 MG/ML IJ SOLN: 6.2500 mg | INTRAMUSCULAR | Status: DC | PRN

## 2014-10-12 MED ORDER — PROPOFOL 10 MG/ML IV BOLUS
INTRAVENOUS | Status: DC | PRN
  Administered 2014-10-12: 100 mg via INTRAVENOUS
  Administered 2014-10-12: 40 mg via INTRAVENOUS

## 2014-10-12 MED ORDER — SODIUM CHLORIDE 0.9 % IV SOLN
INTRAVENOUS | Status: DC
  Administered 2014-10-13: 16:00:00 via INTRAVENOUS

## 2014-10-12 MED ORDER — MORPHINE SULFATE 2 MG/ML IJ SOLN
INTRAMUSCULAR | Status: AC
  Filled 2014-10-12: qty 1

## 2014-10-12 MED ORDER — DARUNAVIR ETHANOLATE 800 MG PO TABS
800.0000 mg | ORAL_TABLET | Freq: Every day | ORAL | Status: DC
  Administered 2014-10-13 – 2014-10-16 (×4): 800 mg via ORAL
  Filled 2014-10-12 (×5): qty 1

## 2014-10-12 MED ORDER — FENTANYL CITRATE 0.05 MG/ML IJ SOLN
25.0000 ug | INTRAMUSCULAR | Status: DC | PRN
  Administered 2014-10-12 (×2): 25 ug via INTRAVENOUS

## 2014-10-12 MED ORDER — MIDAZOLAM HCL 5 MG/5ML IJ SOLN
INTRAMUSCULAR | Status: DC | PRN
  Administered 2014-10-12: 2 mg via INTRAVENOUS

## 2014-10-12 MED ORDER — NEOSTIGMINE METHYLSULFATE 10 MG/10ML IV SOLN
INTRAVENOUS | Status: DC | PRN
  Administered 2014-10-12: 3 mg via INTRAVENOUS

## 2014-10-12 MED ORDER — FENTANYL CITRATE 0.05 MG/ML IJ SOLN
INTRAMUSCULAR | Status: DC | PRN
  Administered 2014-10-12 (×3): 50 ug via INTRAVENOUS

## 2014-10-12 MED ORDER — GABAPENTIN 100 MG PO CAPS
100.0000 mg | ORAL_CAPSULE | Freq: Three times a day (TID) | ORAL | Status: DC
  Administered 2014-10-12 – 2014-10-16 (×11): 100 mg via ORAL
  Filled 2014-10-12 (×14): qty 1

## 2014-10-12 MED ORDER — RITONAVIR 100 MG PO TABS
100.0000 mg | ORAL_TABLET | Freq: Every day | ORAL | Status: DC
  Administered 2014-10-13 – 2014-10-16 (×4): 100 mg via ORAL
  Filled 2014-10-12 (×5): qty 1

## 2014-10-12 MED ORDER — ONDANSETRON HCL 4 MG PO TABS: 4.0000 mg | ORAL_TABLET | Freq: Four times a day (QID) | ORAL | Status: DC | PRN

## 2014-10-12 MED ORDER — EPHEDRINE SULFATE 50 MG/ML IJ SOLN
INTRAMUSCULAR | Status: AC
  Filled 2014-10-12: qty 1

## 2014-10-12 MED ORDER — LANTHANUM CARBONATE 500 MG PO CHEW
1000.0000 mg | CHEWABLE_TABLET | Freq: Three times a day (TID) | ORAL | Status: DC
  Administered 2014-10-14 – 2014-10-16 (×6): 1000 mg via ORAL
  Filled 2014-10-12 (×13): qty 2

## 2014-10-12 MED ORDER — SUCCINYLCHOLINE CHLORIDE 20 MG/ML IJ SOLN
INTRAMUSCULAR | Status: AC
  Filled 2014-10-12: qty 1

## 2014-10-12 MED ORDER — SODIUM CHLORIDE 0.9 % IV SOLN
INTRAVENOUS | Status: DC | PRN
  Administered 2014-10-12 (×2): via INTRAVENOUS

## 2014-10-12 MED ORDER — ALBUMIN HUMAN 5 % IV SOLN
INTRAVENOUS | Status: DC | PRN
  Administered 2014-10-12: 08:00:00 via INTRAVENOUS

## 2014-10-12 MED ORDER — FENTANYL CITRATE 0.05 MG/ML IJ SOLN
INTRAMUSCULAR | Status: AC
  Filled 2014-10-12: qty 5

## 2014-10-12 MED ORDER — MIDAZOLAM HCL 2 MG/2ML IJ SOLN
INTRAMUSCULAR | Status: AC
  Filled 2014-10-12: qty 2

## 2014-10-12 MED ORDER — OXYCODONE-ACETAMINOPHEN 5-325 MG PO TABS
1.0000 | ORAL_TABLET | ORAL | Status: DC | PRN
  Administered 2014-10-12 – 2014-10-16 (×7): 2 via ORAL
  Filled 2014-10-12 (×7): qty 2

## 2014-10-12 MED ORDER — ZIDOVUDINE 100 MG PO CAPS
300.0000 mg | ORAL_CAPSULE | Freq: Every day | ORAL | Status: DC
  Administered 2014-10-12 – 2014-10-16 (×5): 300 mg via ORAL
  Filled 2014-10-12 (×5): qty 3

## 2014-10-12 MED ORDER — ONDANSETRON HCL 4 MG/2ML IJ SOLN
INTRAMUSCULAR | Status: AC
  Filled 2014-10-12: qty 2

## 2014-10-12 MED ORDER — ONDANSETRON HCL 4 MG/2ML IJ SOLN: 4.0000 mg | Freq: Four times a day (QID) | INTRAMUSCULAR | Status: DC | PRN

## 2014-10-12 MED ORDER — LEVOTHYROXINE SODIUM 50 MCG PO TABS
50.0000 ug | ORAL_TABLET | Freq: Every day | ORAL | Status: DC
  Administered 2014-10-13 – 2014-10-16 (×4): 50 ug via ORAL
  Filled 2014-10-12 (×5): qty 1

## 2014-10-12 MED ORDER — PHENYLEPHRINE HCL 10 MG/ML IJ SOLN
10.0000 mg | INTRAVENOUS | Status: DC | PRN
  Administered 2014-10-12: 30 ug/min via INTRAVENOUS

## 2014-10-12 MED ORDER — PHENYLEPHRINE HCL 10 MG/ML IJ SOLN
INTRAMUSCULAR | Status: DC | PRN
  Administered 2014-10-12 (×2): 80 ug via INTRAVENOUS
  Administered 2014-10-12: 40 ug via INTRAVENOUS
  Administered 2014-10-12: 80 ug via INTRAVENOUS
  Administered 2014-10-12: 120 ug via INTRAVENOUS

## 2014-10-12 MED ORDER — SODIUM CHLORIDE 0.9 % IV SOLN
62.5000 mg | INTRAVENOUS | Status: DC
  Administered 2014-10-13: 62.5 mg via INTRAVENOUS
  Filled 2014-10-12 (×2): qty 5

## 2014-10-12 MED ORDER — DEXAMETHASONE SODIUM PHOSPHATE 4 MG/ML IJ SOLN
INTRAMUSCULAR | Status: AC
  Filled 2014-10-12: qty 2

## 2014-10-12 MED ORDER — CARVEDILOL 6.25 MG PO TABS
6.2500 mg | ORAL_TABLET | Freq: Two times a day (BID) | ORAL | Status: DC
  Administered 2014-10-12 – 2014-10-16 (×8): 6.25 mg via ORAL
  Filled 2014-10-12 (×10): qty 1

## 2014-10-12 MED ORDER — ROCURONIUM BROMIDE 100 MG/10ML IV SOLN
INTRAVENOUS | Status: DC | PRN
  Administered 2014-10-12: 35 mg via INTRAVENOUS
  Administered 2014-10-12: 15 mg via INTRAVENOUS

## 2014-10-12 MED ORDER — BUPIVACAINE-EPINEPHRINE (PF) 0.25% -1:200000 IJ SOLN
INTRAMUSCULAR | Status: AC
  Filled 2014-10-12: qty 30

## 2014-10-12 MED ORDER — LIDOCAINE HCL (CARDIAC) 20 MG/ML IV SOLN
INTRAVENOUS | Status: AC
  Filled 2014-10-12: qty 5

## 2014-10-12 MED ORDER — GLYCOPYRROLATE 0.2 MG/ML IJ SOLN
INTRAMUSCULAR | Status: AC
  Filled 2014-10-12: qty 2

## 2014-10-12 MED ORDER — ENOXAPARIN SODIUM 40 MG/0.4ML ~~LOC~~ SOLN
40.0000 mg | SUBCUTANEOUS | Status: DC
  Administered 2014-10-12 – 2014-10-14 (×3): 40 mg via SUBCUTANEOUS
  Filled 2014-10-12 (×3): qty 0.4

## 2014-10-12 MED ORDER — BUPIVACAINE-EPINEPHRINE 0.25% -1:200000 IJ SOLN
INTRAMUSCULAR | Status: DC | PRN
  Administered 2014-10-12: 30 mL

## 2014-10-12 MED ORDER — RALTEGRAVIR POTASSIUM 400 MG PO TABS
400.0000 mg | ORAL_TABLET | Freq: Two times a day (BID) | ORAL | Status: DC
  Administered 2014-10-12 – 2014-10-16 (×7): 400 mg via ORAL
  Filled 2014-10-12 (×9): qty 1

## 2014-10-12 MED ORDER — SODIUM CHLORIDE 0.9 % IJ SOLN
INTRAMUSCULAR | Status: AC
  Filled 2014-10-12: qty 10

## 2014-10-12 MED ORDER — ONDANSETRON HCL 4 MG/2ML IJ SOLN
INTRAMUSCULAR | Status: DC | PRN
  Administered 2014-10-12: 4 mg via INTRAVENOUS

## 2014-10-12 MED ORDER — ALLOPURINOL 300 MG PO TABS
300.0000 mg | ORAL_TABLET | Freq: Every day | ORAL | Status: DC
  Administered 2014-10-12 – 2014-10-16 (×5): 300 mg via ORAL
  Filled 2014-10-12 (×5): qty 1

## 2014-10-12 MED ORDER — RENA-VITE PO TABS
1.0000 | ORAL_TABLET | Freq: Every day | ORAL | Status: DC
  Administered 2014-10-14: 1 via ORAL
  Filled 2014-10-12 (×5): qty 1

## 2014-10-12 MED ORDER — PROPOFOL 10 MG/ML IV BOLUS
INTRAVENOUS | Status: AC
  Filled 2014-10-12: qty 20

## 2014-10-12 MED ORDER — CALCITRIOL 0.5 MCG PO CAPS
0.5000 ug | ORAL_CAPSULE | ORAL | Status: DC
  Administered 2014-10-13 – 2014-10-15 (×2): 0.5 ug via ORAL
  Filled 2014-10-12 (×3): qty 1

## 2014-10-12 SURGICAL SUPPLY — 57 items
BINDER ABD UNIV 10 28-50 (GAUZE/BANDAGES/DRESSINGS) IMPLANT
BINDER ABDOM UNIV 10 (GAUZE/BANDAGES/DRESSINGS) ×3
BLADE SURG ROTATE 9660 (MISCELLANEOUS) ×2 IMPLANT
CANISTER SUCTION 2500CC (MISCELLANEOUS) ×3 IMPLANT
CHLORAPREP W/TINT 26ML (MISCELLANEOUS) ×3 IMPLANT
COVER SURGICAL LIGHT HANDLE (MISCELLANEOUS) ×3 IMPLANT
DEVICE SECURE STRAP 25 ABSORB (INSTRUMENTS) IMPLANT
DEVICE TROCAR PUNCTURE CLOSURE (ENDOMECHANICALS) ×2 IMPLANT
DRAPE INCISE IOBAN 66X45 STRL (DRAPES) ×5 IMPLANT
DRAPE LAPAROSCOPIC ABDOMINAL (DRAPES) ×3 IMPLANT
DRAPE UTILITY XL STRL (DRAPES) ×2 IMPLANT
DRSG OPSITE POSTOP 4X10 (GAUZE/BANDAGES/DRESSINGS) ×2 IMPLANT
DRSG OPSITE POSTOP 4X8 (GAUZE/BANDAGES/DRESSINGS) ×2 IMPLANT
ELECT CAUTERY BLADE 6.4 (BLADE) ×3 IMPLANT
ELECT REM PT RETURN 9FT ADLT (ELECTROSURGICAL) ×3
ELECTRODE REM PT RTRN 9FT ADLT (ELECTROSURGICAL) ×1 IMPLANT
GAUZE SPONGE 2X2 8PLY STRL LF (GAUZE/BANDAGES/DRESSINGS) IMPLANT
GAUZE SPONGE 4X4 12PLY STRL (GAUZE/BANDAGES/DRESSINGS) IMPLANT
GLOVE BIOGEL PI IND STRL 7.0 (GLOVE) IMPLANT
GLOVE BIOGEL PI IND STRL 8 (GLOVE) ×1 IMPLANT
GLOVE BIOGEL PI INDICATOR 7.0 (GLOVE) ×6
GLOVE BIOGEL PI INDICATOR 8 (GLOVE) ×4
GLOVE SS BIOGEL STRL SZ 7.5 (GLOVE) ×1 IMPLANT
GLOVE SUPERSENSE BIOGEL SZ 7.5 (GLOVE) ×4
GLOVE SURG SIGNA 7.5 PF LTX (GLOVE) ×2 IMPLANT
GLOVE SURG SS PI 7.0 STRL IVOR (GLOVE) ×6 IMPLANT
GOWN STRL REUS W/ TWL LRG LVL3 (GOWN DISPOSABLE) ×2 IMPLANT
GOWN STRL REUS W/ TWL XL LVL3 (GOWN DISPOSABLE) ×1 IMPLANT
GOWN STRL REUS W/TWL LRG LVL3 (GOWN DISPOSABLE) ×9
GOWN STRL REUS W/TWL XL LVL3 (GOWN DISPOSABLE) ×6
KIT BASIN OR (CUSTOM PROCEDURE TRAY) ×3 IMPLANT
KIT ROOM TURNOVER OR (KITS) ×3 IMPLANT
MESH HERNIA 10X14 SHEET (Mesh General) ×2 IMPLANT
NDL HYPO 25GX1X1/2 BEV (NEEDLE) ×1 IMPLANT
NEEDLE HYPO 25GX1X1/2 BEV (NEEDLE) ×3 IMPLANT
NS IRRIG 1000ML POUR BTL (IV SOLUTION) ×3 IMPLANT
PACK GENERAL/GYN (CUSTOM PROCEDURE TRAY) ×3 IMPLANT
PAD ARMBOARD 7.5X6 YLW CONV (MISCELLANEOUS) ×4 IMPLANT
PEN SKIN MARKING BROAD (MISCELLANEOUS) ×3 IMPLANT
SPONGE GAUZE 2X2 STER 10/PKG (GAUZE/BANDAGES/DRESSINGS) ×2
SPONGE LAP 18X18 X RAY DECT (DISPOSABLE) ×4 IMPLANT
STAPLER VISISTAT 35W (STAPLE) ×2 IMPLANT
SUT NOVA 0 T20/GS 25DT 4464 63 (SUTURE) ×4 IMPLANT
SUT NOVA 1 T20/GS 25DT (SUTURE) IMPLANT
SUT NOVA T20/GS 25 (SUTURE) ×4 IMPLANT
SUT PDS AB 0 CT 36 (SUTURE) ×4 IMPLANT
SUT SILK 3 0 SH CR/8 (SUTURE) IMPLANT
SUT VIC AB 2-0 CT1 36 (SUTURE) ×2 IMPLANT
SUT VIC AB 3-0 SH 27 (SUTURE)
SUT VIC AB 3-0 SH 27XBRD (SUTURE) IMPLANT
SUT VIC AB 3-0 SH 8-18 (SUTURE) ×2 IMPLANT
SYR CONTROL 10ML LL (SYRINGE) ×3 IMPLANT
TAPE CLOTH SURG 4X10 WHT LF (GAUZE/BANDAGES/DRESSINGS) ×2 IMPLANT
TOWEL OR 17X24 6PK STRL BLUE (TOWEL DISPOSABLE) ×1 IMPLANT
TOWEL OR 17X26 10 PK STRL BLUE (TOWEL DISPOSABLE) ×3 IMPLANT
TRAY FOLEY CATH 14FRSI W/METER (CATHETERS) IMPLANT
WATER STERILE IRR 1000ML POUR (IV SOLUTION) IMPLANT

## 2014-10-12 NOTE — Interval H&P Note (Signed)
History and Physical Interval Note:  10/12/2014 7:14 AM  Bryan Gilmore.  has presented today for surgery, with the diagnosis of ventral incisional hernia  The various methods of treatment have been discussed with the patient and family. After consideration of risks, benefits and other options for treatment, the patient has consented to  Procedure(s): [place] (N/A) INSERTION OF MESH (N/A) as a surgical intervention .  The patient's history has been reviewed, patient examined, no change in status, stable for surgery.  I have reviewed the patient's chart and labs.  Questions were answered to the patient's satisfaction.     Suhaila Troiano T

## 2014-10-12 NOTE — Anesthesia Procedure Notes (Signed)
Procedure Name: Intubation Date/Time: 10/12/2014 7:41 AM Performed by: Julian Reil Pre-anesthesia Checklist: Patient identified, Emergency Drugs available, Suction available and Patient being monitored Patient Re-evaluated:Patient Re-evaluated prior to inductionOxygen Delivery Method: Circle system utilized Preoxygenation: Pre-oxygenation with 100% oxygen Intubation Type: IV induction Ventilation: Mask ventilation without difficulty and Oral airway inserted - appropriate to patient size Laryngoscope Size: Mac and 4 Grade View: Grade I Tube type: Oral Tube size: 7.5 mm Number of attempts: 1 Airway Equipment and Method: Stylet Placement Confirmation: ETT inserted through vocal cords under direct vision,  positive ETCO2 and breath sounds checked- equal and bilateral Secured at: 21 cm Tube secured with: Tape Dental Injury: Teeth and Oropharynx as per pre-operative assessment

## 2014-10-12 NOTE — Op Note (Signed)
Preoperative Diagnosis: ventral incisional hernia  Postoprative Diagnosis: ventral incisional hernia  Procedure: Procedure(s): [place]   Surgeon: Excell Seltzer T   Assistants: Alphonsa Overall M.D.  Anesthesia:  General endotracheal anesthesia  Indications:  Patient is a 64 year old male with multiple medical problems including ESRD and HIV status with previous history of diverting loop colostomy for Fournier's gangrene and subsequent colostomy takedown. He is developed a progressively enlarging ventral incisional hernia through his midline incision which is increasingly symptomatic and painful. After extensive discussion regarding options and risks detailed elsewhere we have elected to proceed with open repair of his ventral incisional hernia with mesh.    Procedure Detail:  Patient was brought to the operating room, placed in supine position on the operating table, and general endotracheal anesthesia induced. He received preoperative IV antibiotics. The abdomen was widely sterilely prepped and draped with Ioban. Patient timeout was performed and correct procedure verified. The previous midline incision skirting the umbilicus was used and dissection carried down through the subclavius tissue to the hernia sac. Hernia sac was dissected away from surrounding subcutaneous tissue and opened. There were omental adhesions up into the sac there were taken down with cautery. The entire hernia sac was excised down to the level of the fascia. There was a discrete fascial defect measuring approximately 12-15 cm in length by about 10 cm wide. Intra-abdominal adhesions were completely taken down off the anterior abdominal wall and there was no evidence of any herniation through the previous colostomy site in the left lateral abdomen. I elected to use a retrorectus technique. The posterior rectus sheath was incised initially just lateral to the fascial edge on the  left side in the retrorectus space entered without difficulty. The space was developed bluntly out laterally to the edge of the rectus muscle on the left. There were of course adhesions to the old colostomy site and using sharp and cautery dissection I was able to dissect the posterior sheath away from the rectus muscle with its remaining intact and again no evidence of herniation up through the anterior fascia. At the edge of the rectus the internal oblique was incised with cautery and the preperitoneal plane developed further out laterally toward the flank. Following this an identical dissection was performed on the patient's right side. Finally the plane was developed along the midline superiorly and inferiorly going up at least 8 or 9 cm above and below the fascial defect. Following this the peritoneum and posterior sheath was closed in the midline with running #1 PDS. A large piece of Prolene mesh was fashioned to place in the retrorectus space which measured about 30 x 25 cm. 8 stay sutures of 0 Novafil were placed circumferentially around the mesh after trimming it to size. This was placed in the retractorectus space and using the Endo Close device through the anterior abdominal wall and fascia each suture was brought up through the abdominal wall and secured with nice wide deployment of the mesh. This appeared to provide very broad coverage. This space was thoroughly irrigated and hemostasis assured. Following this the anterior fascial edges were defined dissecting these for about a centimeter away from the subcutaneous tissue and the fascia could be brought and closed to the midline without tension using running #1 Novafil begun at either end of the incision and tied centrally. The subcutaneous tissue was irrigated. Subcutaneous was closed with running 3-0 Vicryl and skin closed with staples. Sponge and needle And instrument counts were correct.  Findings: As above  Estimated Blood Loss:  Minimal          Drains: none  Blood Given: none          Specimens: None        Complications:  * No complications entered in OR log *         Disposition: PACU - hemodynamically stable.         Condition: stable

## 2014-10-12 NOTE — Transfer of Care (Signed)
Immediate Anesthesia Transfer of Care Note  Patient: Bryan Gilmore.  Procedure(s) Performed: Procedure(s): REPAIR VENTRAL INCISIONAL HERNIA (N/A) INSERTION OF MESH (N/A)  Patient Location: PACU  Anesthesia Type:General  Level of Consciousness: awake, alert , oriented and patient cooperative  Airway & Oxygen Therapy: Patient Spontanous Breathing and Patient connected to nasal cannula oxygen  Post-op Assessment: Report given to RN, Post -op Vital signs reviewed and stable and Patient moving all extremities  Post vital signs: Reviewed and stable  Last Vitals:  Filed Vitals:   10/12/14 0630  BP: 118/79  Pulse: 67  Temp: 56.8 C    Complications: No apparent anesthesia complications

## 2014-10-12 NOTE — Anesthesia Postprocedure Evaluation (Signed)
  Anesthesia Post-op Note  Patient: Bryan Gilmore.  Procedure(s) Performed: Procedure(s): REPAIR VENTRAL INCISIONAL HERNIA (N/A) INSERTION OF MESH (N/A)  Patient Location: PACU  Anesthesia Type:General  Level of Consciousness: awake and alert   Airway and Oxygen Therapy: Patient Spontanous Breathing and Patient connected to nasal cannula oxygen  Post-op Pain: moderate  Post-op Assessment: Post-op Vital signs reviewed and Patient's Cardiovascular Status Stable  Post-op Vital Signs: Reviewed and stable  Last Vitals:  Filed Vitals:   10/12/14 1055  BP: 155/82  Pulse: 55  Temp:   Resp: 17    Complications: No apparent anesthesia complications

## 2014-10-12 NOTE — Consult Note (Signed)
[facility] Renal Consultation Note    Indication for Consultation:  Management of ESRD/hemodialysis; anemia, hypertension/volume and secondary hyperparathyroidism PCP:  HPI: Bryan Gilmore. is a 64 y.o. male with ESRD on TTS [place]) hemodialysis since June 2013 following VDRF cardiopulmonary arrest with d/c to [facility].  He additionally had a history of diverting colostomy for Fourneir's gangrene and subsequent takedown with the development of an enlarging, symptomatic ventral hernia which was repair today by Dr. Abran Cantor.  His last hemodialysis was Saturday.  He has been running 3 - 3.5 of his 4 hour treatments and del kt/v have been generally suboptimal.  In addition to this, his AF has been declining and he had been scheduled for a fistuagram today but that was cancelled due to the hernia repair.  At present he is complaining of a sore throat (was intubated) and abdominal pain. He has no SOB, CP and except for the hernia and access issues had been doing ok at home.  He lives a long and has relatives that check in on him.  Past Medical History  Diagnosis Date  . HIV (human immunodeficiency virus infection)   . Sepsis(995.91)   . Gout   . Hepatitis C   . Fournier's gangrene   . Anemia   . Severe protein-calorie malnutrition   . Alcohol abuse   . Dysrhythmia     AFib, not a candidate for coumadin , dr [name] signed off on pt  . Hypertension   . Colostomy status   . Sebaceous cyst   . Cough   . Unspecified constipation   . Gout, unspecified   . GERD (gastroesophageal reflux disease)   . Atrial fibrillation   . Ulcer of lower limb, unspecified   . Ulcer of lower limb, unspecified   . Human immunodeficiency virus (HIV) disease   . Other severe protein-calorie malnutrition   . Disorders of phosphorus metabolism   . Hypopotassemia   . Anemia in chronic kidney disease(285.21)   . Anemia in chronic kidney disease(285.21)   . Anemia, unspecified   . Hepatitis,  unspecified   . Altered mental status   . Hemoptysis, unspecified   . Acute respiratory failure   . Gangrene   . Sepsis(995.91)   . Peripheral vascular disease, unspecified     denies  . End stage renal disease     hemodialysis tues, thurs, sat-east fornesia  . End stage renal disease   . Arthritis     gout  . Hypothyroidism    Past Surgical History  Procedure Laterality Date  . Cystoscopy with urethral dilatation  02/17/2012    Procedure: CYSTOSCOPY WITH URETHRAL DILATATION;  Surgeon: Reece Packer, MD;  Location: [facility];  Service: Urology;  Laterality: N/A;  retrograde urethragram, placement of foley catheter exam under anesthesia   . Insertion of dialysis catheter  02/20/2012    Procedure: INSERTION OF DIALYSIS CATHETER;  Surgeon: Angelia Mould, MD;  Location: [facility] OR;  Service: Vascular;  Laterality: N/A;  . Irrigation and debridement abscess  02/29/2012    Procedure: IRRIGATION AND DEBRIDEMENT ABSCESS;  Surgeon: Reece Packer, MD;  Location: [place];  Service: Urology;  Laterality: N/A;  I&D of scrotum abcess  . Insertion of dialysis catheter  03/29/2012    Procedure: INSERTION OF DIALYSIS CATHETER;  Surgeon: Rosetta Posner, MD;  Location: [facility] OR;  Service: Vascular;  Laterality: N/A;  Insertion tunneled dialysis catheter right IJ  . Av fistula placement  04/12/2012  Procedure: INSERTION OF ARTERIOVENOUS (AV) GORE-TEX GRAFT ARM;  Surgeon: Rosetta Posner, MD;  Location: [facility] OR;  Service: Vascular;  Laterality: Left;  insertion of 6x50 stretch goretex graft   . Thrombectomy and revision of arterioventous (av) goretex  graft  07/15/2012    Procedure: THROMBECTOMY AND REVISION OF ARTERIOVENTOUS (AV) GORETEX  GRAFT;  Surgeon: Elam Dutch, MD;  Location: [place];  Service: Vascular;  Laterality: Left;  . Urethrogram  07/30/2012    Procedure: URETHROGRAM;  Surgeon: Reece Packer, MD;  Location: [facility];  Service: Urology;  Laterality: N/A;  cystogram  . Cystoscopy  07/30/2012     Procedure: CYSTOSCOPY;  Surgeon: Reece Packer, MD;  Location: [facility];  Service: Urology;  Laterality: Bilateral;  retrograde  . Insertion of dialysis catheter  08/04/2012    Procedure: INSERTION OF DIALYSIS CATHETER;  Surgeon: Angelia Mould, MD;  Location: [place];  Service: Vascular;  Laterality: Right;  right intrajugular hemodialysis catheter insertion  . Av fistula placement  08/14/2012    Procedure: INSERTION OF ARTERIOVENOUS (AV) GORE-TEX GRAFT ARM;  Surgeon: Mal Misty, MD;  Location: [place];  Service: Vascular;  Laterality: Left;  . Colostomy  02/2012    d/t an infection  . Av fistula placement Right 11/08/2012    Procedure: ARTERIOVENOUS (AV) FISTULA CREATION;  Surgeon: Angelia Mould, MD;  Location: [facility] OR;  Service: Vascular;  Laterality: Right;  . Hernia repair      Umbilical  . Surgery for fournier's gangrene  02/2012  . Colon surgery  13    colostomy  . Colostomy closure  02/21/2013  . Colostomy closure N/A 02/21/2013    Procedure: COLOSTOMY CLOSURE;  Surgeon: [name], MD;  Location: [place];  Service: General;  Laterality: N/A;  takedown of sigmoid colostomy with anastomosis  . Bowel resection N/A 02/21/2013    Procedure: SMALL BOWEL RESECTION;  Surgeon: [name], MD;  Location: [place];  Service: General;  Laterality: N/A;  . Fistulogram Right 01/06/2013    Procedure: FISTULOGRAM;  Surgeon: Angelia Mould, MD;  Location: [facility] CATH LAB;  Service: Cardiovascular;  Laterality: Right;   Family History  Problem Relation Age of Onset  . Hypertension Mother   . Hypertension Father   . Cancer Cousin     breast   Social History:  reports that he has never smoked. He has never used smokeless tobacco. He reports that he does not drink alcohol or use illicit drugs. No Known Allergies Prior to Admission medications   Medication Sig Start Date End Date Taking? Authorizing Provider  allopurinol (ZYLOPRIM) 300 MG tablet TAKE ONE TABLET BY  MOUTH ONCE DAILY FOR GOUT 08/08/13  Yes Lauree Chandler, NP  carvedilol (COREG) 6.25 MG tablet Take 1 tablet (6.25 mg total) by mouth 2 (two) times daily. 07/29/14  Yes Mahima Bubba Camp, MD  Darunavir Ethanolate (PREZISTA) 800 MG tablet Take 1 tablet (800 mg total) by mouth daily with breakfast. 05/20/14  Yes Carlyle Basques, MD  gabapentin (NEURONTIN) 100 MG capsule TAKE ONE CAPSULE BY MOUTH THREE TIMES DAILY 09/14/14  Yes Lauree Chandler, NP  ISENTRESS 400 MG tablet TAKE ONE TABLET BY MOUTH BY MOUTH TWICE DAILY 05/20/14  Yes Carlyle Basques, MD  lamivudine (EPIVIR) 100 MG tablet TAKE 1/2 TABLET BY MOUTH EVERY DAY 05/20/14  Yes Carlyle Basques, MD  lanthanum (FOSRENOL) 1000 MG chewable tablet Chew 1,000 mg by mouth 3 (three) times daily with meals.    Yes Historical  Provider, MD  levothyroxine (SYNTHROID, LEVOTHROID) 50 MCG tablet Take 1 tablet (50 mcg total) by mouth daily. Patient taking differently: Take 50 mcg by mouth daily before breakfast.  09/02/14  Yes Mahima Pandey, MD  NORVIR 100 MG TABS tablet TAKE 1 TABLET BY MOUTH EVERY DAY 05/20/14  Yes Carlyle Basques, MD  zidovudine (RETROVIR) 300 MG tablet TAKE ONE TABLET BY MOUTH EVERY DAY 05/20/14  Yes Carlyle Basques, MD  aspirin EC 325 MG tablet Take 1 tablet (325 mg total) by mouth daily. Patient not taking: Reported on 10/05/2014 07/01/14   Josue Hector, MD   Current Facility-Administered Medications  Medication Dose Route Frequency Provider Last Rate Last Dose  . 0.9 %  sodium chloride infusion   Intravenous Continuous Excell Seltzer, MD      . allopurinol (ZYLOPRIM) tablet 300 mg  300 mg Oral Daily Excell Seltzer, MD      . carvedilol (COREG) tablet 6.25 mg  6.25 mg Oral BID WC Excell Seltzer, MD      . Derrill Memo ON 10/13/2014] Darunavir Ethanolate (PREZISTA) tablet 800 mg  800 mg Oral Q breakfast Excell Seltzer, MD      . enoxaparin (LOVENOX) injection 40 mg  40 mg Subcutaneous Q24H Excell Seltzer, MD      . gabapentin (NEURONTIN) capsule  100 mg  100 mg Oral TID Excell Seltzer, MD      . lamiVUDine (EPIVIR) 10 MG/ML solution 50 mg  50 mg Oral Daily Excell Seltzer, MD      . lanthanum [facility] Iv) chewable tablet 1,000 mg  1,000 mg Oral TID WC Excell Seltzer, MD      . Derrill Memo ON 10/13/2014] levothyroxine (SYNTHROID, LEVOTHROID) tablet 50 mcg  50 mcg Oral QAC breakfast Excell Seltzer, MD      . morphine 2 MG/ML injection 2-6 mg  2-6 mg Intravenous Q2H PRN Excell Seltzer, MD   2 mg at 10/12/14 1204  . morphine 2 MG/ML injection           . ondansetron (ZOFRAN) tablet 4 mg  4 mg Oral Q6H PRN Excell Seltzer, MD       Or  . ondansetron [facility]) injection 4 mg  4 mg Intravenous Q6H PRN Excell Seltzer, MD      . oxyCODONE-acetaminophen (PERCOCET/ROXICET) 5-325 MG per tablet 1-2 tablet  1-2 tablet Oral Q4H PRN Excell Seltzer, MD      . raltegravir (ISENTRESS) tablet 400 mg  400 mg Oral BID Excell Seltzer, MD      . Derrill Memo ON 10/13/2014] ritonavir (NORVIR) tablet 100 mg  100 mg Oral Q breakfast Excell Seltzer, MD      . zidovudine (RETROVIR) capsule 300 mg  300 mg Oral Daily Excell Seltzer, MD       Labs: Basic Metabolic Panel:  Recent Labs Lab 10/07/14 0833 10/12/14 0622  NA 136 134*  K 3.9 4.1  CL 94*  --   CO2 29  --   GLUCOSE 124* 92  BUN 32*  --   CREATININE 8.30*  --   CALCIUM 9.7  --    Liver Function Tests:  Recent Labs Lab 10/07/14 0833  AST 17  ALT 14  ALKPHOS 74  BILITOT 0.8  PROT 8.6*  ALBUMIN 3.9  CBC:  Recent Labs Lab 10/07/14 0833 10/12/14 0622  WBC 5.7  --   HGB 11.9* 11.2*  HCT 34.6* 33.0*  MCV 103.3*  --   PLT 167  --    CBG:  Recent Labs Lab 10/12/14 1028  10/12/14 1158  GLUCAP 95 116*  Studies/Results: No results found.  ROS: As per HPI otherwise negative.  Physical Exam: Filed Vitals:   10/12/14 1115 10/12/14 1125 10/12/14 1130 10/12/14 1140  BP: 149/78 127/85 127/85 140/87  Pulse:  47  56  Temp:    97.7 F (36.5 C)  TempSrc:      Resp:  16   19  Height:      Weight:      SpO2:  100%  100%     General:  NAD resting in room post surgery using pillow to spint pain Head: Normocephalic, atraumatic, sclera non-icteric, mucus membranes are moist Neck: Supple. JVD not elevated. Lungs: Clear bilaterally to auscultation without wheezes, rales, or rhonchi.  Heart: RRR with S1 S2.  Abdomen: Soft, dressing with abdominal binder in place Lower extremities: without edema or ischemic changes, no open wounds /feet somewhat calloused/ SCDs in place Neuro: Alert and oriented X 3. Moves all extremities spontaneously. Psych:  Responds to questions appropriately with a normal affect. Dialysis Access: right lower AVF +  Bruit  Dialysis Orders: Center: [facility] 180 4 hr EDW 101.5 3K 2.25 Ca right lower AVF 450/A 1.5 8600 heparin venofer 50/week, calcitriol 0.25 TIW and Mircera 50 q 2 weeks last given 1/ 26 Recent labs:  Hgb 11 1/28 ferritin 560 tsat 32% iPTH 688 increased C/P ok Access issues: had a f'gram schedule for [facility] today - cancelled due to surgery - last intervention 11/20 but AF sig declining 1315 Dec 798 1/12 and 584 1/16   Assessment/Plan: 1.  s/p ventral hernia repair 2/1 - Dr. Abran Cantor 2.  ESRD -  TTS Needs f'gram - but he doesn't want to have done now - wants to go back to [facility]'; advised as long as no problems running AVF we can postpone; Hd scheduled for Tuesday- no heparin due to post op status  3.  Hypertension/volume  - not getting to EDW - Dr. Moshe Cipro wanted to raise but pt declined - eval here; on coreg 6.25 bid  4.  Anemia  - on Mrcira - next dose due 2/11 - if Hgb declines - can give interim Aranesp - prior Aranesp dose  Was 25 q 4 weeks; follow CBC 5.  Metabolic bone disease -  iPTH increasing will increase calcitriol to 0.5 TTS- on fosrenol (not phoslo)- need to update outpt med list at d/c 6.  Nutrition - NPO post op - advance as tolerated; Alb 3.9 prior to surgery 7.  HIV/Hep C + 8. Gout 9. Hypothyroidism - on  synthroid  Myriam Jacobson, PA-C [facility] Beeper [phone] 10/12/2014, 12:41 PM   Renal Attending: Post op ventral hernia repair in an ESRD pt.  WIll proceed with plans for HD on schedule TTS.  Agree with note as articulated above. He may need a EDW adjustment but anticipates losing weight post op. Lunna Vogelgesang C

## 2014-10-12 NOTE — H&P (Signed)
Bryan Gilmore JR 10/07/2014 9:04 AM Location: [facility] Patient #: [id number] DOB: [birth year] Married / Language: English / Race: Black or African American Male  History of Present Illness [name] [name] MD; 10/07/2014 9:25 AM) Patient words: Recheck before surgery.  The patient is a 64 year old male who presents with an incisional hernia. He returns for preop visit prior to planned ventral incisional hernia repair. Has a history of Fournier's gangrene with extensive hospitalization in 2013 requiring colostomy. Subsequent colostomy takedown 2014. He has developed a progressive ventral incisional hernia that is enlarging and symptomatic with pain and pressure. We have discussed repair last fall. He has cardiac clearance. HIV is stable. Also end-stage renal disease on dialysis. All his medical problems appear stable.   Other Problems [name], MD; 10/07/2014 9:26 AM) HIV-positive High blood pressure VENTRAL INCISIONAL HERNIA (553.21  K43.2) Hepatitis Chronic Renal Failure Syndrome  Past Surgical History [name], MD; 10/07/2014 9:26 AM) Dialysis Shunt / Fistula  Diagnostic Studies History [name], MD; 10/07/2014 9:26 AM) Colonoscopy 1-5 years ago  Allergies [name], MD; 10/07/2014 9:26 AM) No Known Drug Allergies10/28/2015  Medication History [name], MD; 10/07/2014 9:26 AM) Medications Reconciled Isentress (400MG  Tablet, Oral) Active. LamiVUDine (100MG  Tablet, Oral) Active. Norvir (100MG  Tablet, Oral) Active. Prezista (800MG  Tablet, Oral) Active. Zidovudine (300MG  Tablet, Oral) Active.  Social History [name], MD; 10/07/2014 9:26 AM) Alcohol use Remotely quit alcohol use. Caffeine use Tea. Tobacco use Never smoker. No drug use  Family History [name], MD; 10/07/2014 9:26 AM) First Degree Relatives No pertinent family history  Vitals [name]  RMA; 10/07/2014 9:08 AM) 10/07/2014 9:08 AM Weight: 230.6 lb Height: 71in Body Surface Area: 2.29 m Body Mass Index: 32.16 kg/m Temp.: 97.64F(Oral)  Pulse: 76 (Regular)  Resp.: 15 (Unlabored)  BP: 138/90 (Sitting, Left Arm, Standard)    Physical Exam [name] [name] MD; 10/07/2014 9:26 AM) The physical exam findings are as follows: Note:General: Afro-American male in no distress Skin: No rash or infection Lungs: Clear equal breath sounds without increased work of breathing Cardiac: Regular rate and rhythm. No edema Abdomen: Healed midline and left lower quadrant incisions. Large ventral hernia presenting to the right of midline through the majority of his midline incision. Slightly tender. Not completely reducible. No masses or organomegaly. Extremities: No edema Neurologic: Alert and fully oriented. Gait normal.    Assessment & Plan [name] Saifan Rayford MD; 10/07/2014 9:27 AM) VENTRAL INCISIONAL HERNIA (553.21  K43.2) Impression: His hernia is large and symptomatic. We discussed that this would be major surgery with risks, particularly with his multiple co morbidities. We discussed he would be at higher risk of wound healing problems, recurrence, CV problems or even death. He feels strongly that he wants repair. He has cardiac clearance. Medical problems are stable. Plan to proceed with repair as previously discussed     Signed by [name], MD (10/07/2014 9:28 AM)

## 2014-10-13 ENCOUNTER — Encounter (HOSPITAL_COMMUNITY): Payer: Self-pay | Admitting: General Surgery

## 2014-10-13 LAB — CBC
HCT: 30.9 % — ABNORMAL LOW (ref 39.0–52.0)
HEMOGLOBIN: 10.3 g/dL — AB (ref 13.0–17.0)
MCH: 34.7 pg — ABNORMAL HIGH (ref 26.0–34.0)
MCHC: 33.3 g/dL (ref 30.0–36.0)
MCV: 104 fL — ABNORMAL HIGH (ref 78.0–100.0)
Platelets: 182 10*3/uL (ref 150–400)
RBC: 2.97 MIL/uL — ABNORMAL LOW (ref 4.22–5.81)
RDW: 13.4 % (ref 11.5–15.5)
WBC: 11.1 10*3/uL — ABNORMAL HIGH (ref 4.0–10.5)

## 2014-10-13 LAB — GLUCOSE, CAPILLARY: Glucose-Capillary: 115 mg/dL — ABNORMAL HIGH (ref 70–99)

## 2014-10-13 LAB — BASIC METABOLIC PANEL
Anion gap: 16 — ABNORMAL HIGH (ref 5–15)
BUN: 64 mg/dL — AB (ref 6–23)
CO2: 21 mmol/L (ref 19–32)
Calcium: 9.5 mg/dL (ref 8.4–10.5)
Chloride: 95 mmol/L — ABNORMAL LOW (ref 96–112)
Creatinine, Ser: 11.05 mg/dL — ABNORMAL HIGH (ref 0.50–1.35)
GFR calc Af Amer: 5 mL/min — ABNORMAL LOW (ref >90)
GFR calc non Af Amer: 4 mL/min — ABNORMAL LOW (ref >90)
Glucose, Bld: 121 mg/dL — ABNORMAL HIGH (ref 70–99)
Potassium: 6.1 mmol/L (ref 3.5–5.1)
Sodium: 132 mmol/L — ABNORMAL LOW (ref 135–145)

## 2014-10-13 LAB — HEPATITIS B SURFACE ANTIGEN: HEP B S AG: NEGATIVE

## 2014-10-13 MED ORDER — SODIUM CHLORIDE 0.9 % IV SOLN: 100.0000 mL | INTRAVENOUS | Status: DC | PRN

## 2014-10-13 MED ORDER — PENTAFLUOROPROP-TETRAFLUOROETH EX AERO: 1.0000 "application " | INHALATION_SPRAY | CUTANEOUS | Status: DC | PRN

## 2014-10-13 MED ORDER — LIDOCAINE-PRILOCAINE 2.5-2.5 % EX CREA: 1.0000 "application " | TOPICAL_CREAM | CUTANEOUS | Status: DC | PRN

## 2014-10-13 MED ORDER — LIDOCAINE HCL (PF) 1 % IJ SOLN: 5.0000 mL | INTRAMUSCULAR | Status: DC | PRN

## 2014-10-13 MED ORDER — NEPRO/CARBSTEADY PO LIQD: 237.0000 mL | ORAL | Status: DC | PRN

## 2014-10-13 MED ORDER — ALTEPLASE 2 MG IJ SOLR: 2.0000 mg | Freq: Once | INTRAMUSCULAR | Status: DC | PRN

## 2014-10-13 NOTE — Progress Notes (Signed)
Patient ID: Bryan Demetro., male   DOB: May 08, 1951, 64 y.o.   MRN: [id number] 1 Day Post-Op  Subjective: Pt seen in hemodialysis.  Very comfortable, remarkably little pain, denies nausea or other C/O  Objective: Vital signs in last 24 hours: Temp:  [97.4 F (36.3 C)-98.7 F (37.1 C)] 98.3 F (36.8 C) (02/02 0700) Pulse Rate:  [47-84] 84 (02/02 0830) Resp:  [16-20] 16 (02/02 0700) BP: (105-163)/(77-114) 105/89 mmHg (02/02 0830) SpO2:  [96 %-100 %] 100 % (02/02 0700) Weight:  [227 lb 4.7 oz (103.1 kg)] 227 lb 4.7 oz (103.1 kg) (02/02 0700) Last BM Date: 10/12/14  Intake/Output from previous day: 02/01 0701 - 02/02 0700 In: 1050 [I.V.:800; IV Piggyback:250] Out: 50 [Blood:50] Intake/Output this shift:    General appearance: alert, cooperative and no distress Resp: No wheezing or increased WOB GI: normal findings: soft, non-tender Incision/Wound: Clean and dry  Lab Results:   Recent Labs  10/12/14 0622 10/13/14 0458  WBC  --  11.1*  HGB 11.2* 10.3*  HCT 33.0* 30.9*  PLT  --  182   BMET  Recent Labs  10/12/14 0622 10/13/14 0458  NA 134* 132*  K 4.1 6.1*  CL  --  95*  CO2  --  21  GLUCOSE 92 121*  BUN  --  64*  CREATININE  --  11.05*  CALCIUM  --  9.5     Studies/Results: No results found.  Anti-infectives: Anti-infectives    Start     Dose/Rate Route Frequency Ordered Stop   10/13/14 0800  ritonavir (NORVIR) tablet 100 mg     100 mg Oral Daily with breakfast 10/12/14 1154     10/13/14 0800  Darunavir Ethanolate (PREZISTA) tablet 800 mg     800 mg Oral Daily with breakfast 10/12/14 1154     10/12/14 2200  raltegravir (ISENTRESS) tablet 400 mg     400 mg Oral 2 times daily 10/12/14 1154     10/12/14 1230  lamiVUDine (EPIVIR) 10 MG/ML solution 50 mg     50 mg Oral Daily 10/12/14 1227     10/12/14 1200  zidovudine (RETROVIR) capsule 300 mg     300 mg Oral Daily 10/12/14 1154     10/12/14 1200  lamivudine (EPIVIR) tablet 50 mg  Status:  Discontinued      50 mg Oral Daily 10/12/14 1154 10/12/14 1224   10/12/14 0600  ceFAZolin (ANCEF) IVPB 2 g/50 mL premix     2 g100 mL/hr over 30 Minutes Intravenous On call to O.R. 10/11/14 1353 10/12/14 0747      Assessment/Plan: s/p Procedure(s): REPAIR VENTRAL INCISIONAL HERNIA INSERTION OF MESH Doing well post op without apparent complication.  Start CL diet.  OOB ESRD-renal svc following for dialysis needs   LOS: 1 day    Gianina Olinde T 10/13/2014

## 2014-10-13 NOTE — Procedures (Signed)
Tolerating hemodialysis so far today with no cramping.  No hemodynamic instability at the current time. Quinton Voth C

## 2014-10-13 NOTE — Progress Notes (Signed)
CRITICAL VALUE ALERT  Critical value received: Potassium 6.1  Date of notification: 10-13-2014  Time of notification:  0713  Critical value read back:yes  Nurse who received alert:  Ronny Bacon, RN  MD notified (1st page):  Posey Pronto  Time of first page: [phone]  MD notified (2nd page):  Time of second page:  Responding MD:  Jeannett Senior  Time MD responded:  [phone]

## 2014-10-14 LAB — BASIC METABOLIC PANEL
Anion gap: 14 (ref 5–15)
BUN: 40 mg/dL — ABNORMAL HIGH (ref 6–23)
CHLORIDE: 90 mmol/L — AB (ref 96–112)
CO2: 27 mmol/L (ref 19–32)
CREATININE: 7.69 mg/dL — AB (ref 0.50–1.35)
Calcium: 9.5 mg/dL (ref 8.4–10.5)
GFR calc Af Amer: 8 mL/min — ABNORMAL LOW (ref >90)
GFR, EST NON AFRICAN AMERICAN: 7 mL/min — AB (ref >90)
GLUCOSE: 124 mg/dL — AB (ref 70–99)
POTASSIUM: 4.6 mmol/L (ref 3.5–5.1)
SODIUM: 131 mmol/L — AB (ref 135–145)

## 2014-10-14 LAB — CBC
HCT: 30.1 % — ABNORMAL LOW (ref 39.0–52.0)
Hemoglobin: 9.9 g/dL — ABNORMAL LOW (ref 13.0–17.0)
MCH: 34.5 pg — ABNORMAL HIGH (ref 26.0–34.0)
MCHC: 32.9 g/dL (ref 30.0–36.0)
MCV: 104.9 fL — ABNORMAL HIGH (ref 78.0–100.0)
Platelets: 165 10*3/uL (ref 150–400)
RBC: 2.87 MIL/uL — ABNORMAL LOW (ref 4.22–5.81)
RDW: 13.6 % (ref 11.5–15.5)
WBC: 10.7 10*3/uL — ABNORMAL HIGH (ref 4.0–10.5)

## 2014-10-14 MED ORDER — ENOXAPARIN SODIUM 30 MG/0.3ML ~~LOC~~ SOLN
30.0000 mg | SUBCUTANEOUS | Status: DC
  Administered 2014-10-15: 30 mg via SUBCUTANEOUS
  Filled 2014-10-14 (×2): qty 0.3

## 2014-10-14 NOTE — Progress Notes (Signed)
[facility] Progress Note  Assessment/Plan: 1. s/p ventral hernia repair 2/1 - Dr. Abran Cantor 2. ESRD - TTS Needs f'gram - but he doesn't want to have done now - wants to go back to [facility]'; advised as long as no problems running AVF we can postpone; Hd scheduled for Thursday cotninue no heparin due to post op status K 6.1 pre HD down to 4.6 today - ^ K could be related to dim fx of AVF - follow 3. Hypertension/volume - not getting to EDW - Dr. Moshe Cipro wanted to raise but pt declined - eval here; on coreg 6.25 bid Net UF 1.6 on Tues; need to get volume down more 4. Anemia - on Mrcira - next dose due 2/11 - if Hgb declines - can give interim Aranesp - prior Aranesp dose Was 25 q 4 weeks; follow CBC Hgb 9.9 - not down that much - hold on ESA for now 5. Metabolic bone disease - iPTH increasing will increase calcitriol to 0.5 TTS- on fosrenol (not phoslo)- need to update outpt med list at d/c 6. Nutrition - diet advanced to full; Alb 3.9 fairly well nourished prior to surgery 7. HIV/Hep C + 8. Gout 9. Hypothyroidism - on synthroid  Bryan Jacobson, PA-C [place] [phone] 10/14/2014,9:06 AM  LOS: 2 days    Renal Attending: No complaints and recovering from surgery.  On deck for HD in AM to get back on schedule. Crimson Dubberly C    Subjective:   Passing gas. Plans to call [facility] to try to reschedule Fgram for Monday - thinks he will be out of the hospital by then. Wants more to eat  Objective Filed Vitals:   10/13/14 1452 10/13/14 1753 10/13/14 2100 10/14/14 0447  BP: 148/91 137/89 113/72 123/87  Pulse: 86 72 73 81  Temp: 98.6 F (37 C) 98.6 F (37 C) 98.7 F (37.1 C) 97.7 F (36.5 C)  TempSrc: Oral Oral Oral Oral  Resp: 20 18 18 18   Height:      Weight:      SpO2: 99% 98% 96% 97%   Physical Exam General: NAD  Heart: RRR with occ ectopy Lungs: grossly clear Abdomen: distended with large binder on Extremities: no LE  edema Dialysis Access: right lower AVF + bruit  Dialysis Orders: [facility]: [facility] 180 4 hr EDW 101.5 3K 2.25 Ca right lower AVF 450/A 1.5 8600 heparin venofer 50/week, calcitriol 0.25 TIW and Mircera 50 q 2 weeks last given 1/ 26 Recent labs: Hgb 11 1/28 ferritin 560 tsat 32% iPTH 688 increased C/P ok Access issues: had a f'gram schedule for [facility] today - cancelled due to surgery - last intervention 11/20 but AF sig declining 1315 Dec 798 1/12 and 584 1/16   Additional Objective Labs: Basic Metabolic Panel:  Recent Labs Lab 10/12/14 0622 10/13/14 0458 10/14/14 0453  NA 134* 132* 131*  K 4.1 6.1* 4.6  CL  --  95* 90*  CO2  --  21 27  GLUCOSE 92 121* 124*  BUN  --  64* 40*  CREATININE  --  11.05* 7.69*  CALCIUM  --  9.5 9.5  CBC:  Recent Labs Lab 10/12/14 0622 10/13/14 0458 10/14/14 0453  WBC  --  11.1* 10.7*  HGB 11.2* 10.3* 9.9*  HCT 33.0* 30.9* 30.1*  MCV  --  104.0* 104.9*  PLT  --  182 165  CBG:  Recent Labs Lab 10/12/14 1028 10/12/14 1158 10/13/14 1218  GLUCAP 95 116* 115*  Medications: . sodium chloride 10 mL/hr at 10/13/14 1615   . allopurinol  300 mg Oral Daily  . calcitRIOL  0.5 mcg Oral Q T,Th,Sa-HD  . carvedilol  6.25 mg Oral BID WC  . Darunavir Ethanolate  800 mg Oral Q breakfast  . enoxaparin (LOVENOX) injection  40 mg Subcutaneous Q24H  . ferric gluconate (FERRLECIT/NULECIT) IV  62.5 mg Intravenous Q Tue-HD  . gabapentin  100 mg Oral TID  . lamiVUDine  50 mg Oral Daily  . lanthanum  1,000 mg Oral TID WC  . levothyroxine  50 mcg Oral QAC breakfast  . multivitamin  1 tablet Oral QHS  . raltegravir  400 mg Oral BID  . ritonavir  100 mg Oral Q breakfast  . zidovudine  300 mg Oral Daily

## 2014-10-14 NOTE — Progress Notes (Signed)
Patient ID: Bryan Shawn., male   DOB: 1951/08/06, 64 y.o.   MRN: [id number] 2 Days Post-Op  Subjective: No C/O, denies nausea or pain.  Has been walking.  Tol CL diet well and wants more to eat  Objective: Vital signs in last 24 hours: Temp:  [97.4 F (36.3 C)-98.7 F (37.1 C)] 97.7 F (36.5 C) (02/03 0447) Pulse Rate:  [68-99] 81 (02/03 0447) Resp:  [18-20] 18 (02/03 0447) BP: (113-188)/(55-98) 123/87 mmHg (02/03 0447) SpO2:  [96 %-99 %] 97 % (02/03 0447) Last BM Date: 10/11/14  Intake/Output from previous day: 02/02 0701 - 02/03 0700 In: -  Out: 1600  Intake/Output this shift:    General appearance: alert, cooperative and no distress GI: normal findings: soft, non-tender Incision/Wound: Tiny amt bloody drainage on dressing without signs of infection  Lab Results:   Recent Labs  10/13/14 0458 10/14/14 0453  WBC 11.1* 10.7*  HGB 10.3* 9.9*  HCT 30.9* 30.1*  PLT 182 165   BMET  Recent Labs  10/13/14 0458 10/14/14 0453  NA 132* 131*  K 6.1* 4.6  CL 95* 90*  CO2 21 27  GLUCOSE 121* 124*  BUN 64* 40*  CREATININE 11.05* 7.69*  CALCIUM 9.5 9.5     Studies/Results: No results found.  Anti-infectives: Anti-infectives    Start     Dose/Rate Route Frequency Ordered Stop   10/13/14 0800  ritonavir (NORVIR) tablet 100 mg     100 mg Oral Daily with breakfast 10/12/14 1154     10/13/14 0800  Darunavir Ethanolate (PREZISTA) tablet 800 mg     800 mg Oral Daily with breakfast 10/12/14 1154     10/12/14 2200  raltegravir (ISENTRESS) tablet 400 mg     400 mg Oral 2 times daily 10/12/14 1154     10/12/14 1230  lamiVUDine (EPIVIR) 10 MG/ML solution 50 mg     50 mg Oral Daily 10/12/14 1227     10/12/14 1200  zidovudine (RETROVIR) capsule 300 mg     300 mg Oral Daily 10/12/14 1154     10/12/14 1200  lamivudine (EPIVIR) tablet 50 mg  Status:  Discontinued     50 mg Oral Daily 10/12/14 1154 10/12/14 1224   10/12/14 0600  ceFAZolin (ANCEF) IVPB 2 g/50 mL premix     2 g100 mL/hr over 30 Minutes Intravenous On call to O.R. 10/11/14 1353 10/12/14 0747      Assessment/Plan: s/p Procedure(s): REPAIR VENTRAL INCISIONAL HERNIA INSERTION OF MESH Doing well without apparent complication.   FL diet.  Ambulate   LOS: 2 days    Gael Delude T 10/14/2014

## 2014-10-15 LAB — CBC
HCT: 25.6 % — ABNORMAL LOW (ref 39.0–52.0)
Hemoglobin: 8.9 g/dL — ABNORMAL LOW (ref 13.0–17.0)
MCH: 35.3 pg — ABNORMAL HIGH (ref 26.0–34.0)
MCHC: 34.8 g/dL (ref 30.0–36.0)
MCV: 101.6 fL — ABNORMAL HIGH (ref 78.0–100.0)
Platelets: 149 10*3/uL — ABNORMAL LOW (ref 150–400)
RBC: 2.52 MIL/uL — ABNORMAL LOW (ref 4.22–5.81)
RDW: 12.8 % (ref 11.5–15.5)
WBC: 7.8 10*3/uL (ref 4.0–10.5)

## 2014-10-15 LAB — RENAL FUNCTION PANEL
Albumin: 3.2 g/dL — ABNORMAL LOW (ref 3.5–5.2)
Anion gap: 13 (ref 5–15)
BUN: 63 mg/dL — ABNORMAL HIGH (ref 6–23)
CO2: 26 mmol/L (ref 19–32)
Calcium: 9.1 mg/dL (ref 8.4–10.5)
Chloride: 86 mmol/L — ABNORMAL LOW (ref 96–112)
Creatinine, Ser: 9.71 mg/dL — ABNORMAL HIGH (ref 0.50–1.35)
GFR calc Af Amer: 6 mL/min — ABNORMAL LOW (ref >90)
GFR calc non Af Amer: 5 mL/min — ABNORMAL LOW (ref >90)
Glucose, Bld: 122 mg/dL — ABNORMAL HIGH (ref 70–99)
Phosphorus: 8.3 mg/dL — ABNORMAL HIGH (ref 2.3–4.6)
Potassium: 3.9 mmol/L (ref 3.5–5.1)
Sodium: 125 mmol/L — ABNORMAL LOW (ref 135–145)

## 2014-10-15 MED ORDER — LIDOCAINE-PRILOCAINE 2.5-2.5 % EX CREA
1.0000 "application " | TOPICAL_CREAM | CUTANEOUS | Status: DC | PRN
  Filled 2014-10-15: qty 5

## 2014-10-15 MED ORDER — ALTEPLASE 2 MG IJ SOLR
2.0000 mg | Freq: Once | INTRAMUSCULAR | Status: AC | PRN
  Filled 2014-10-15: qty 2

## 2014-10-15 MED ORDER — HEPARIN SODIUM (PORCINE) 1000 UNIT/ML DIALYSIS: 1000.0000 [IU] | INTRAMUSCULAR | Status: DC | PRN

## 2014-10-15 MED ORDER — NEPRO/CARBSTEADY PO LIQD
237.0000 mL | ORAL | Status: DC | PRN
  Filled 2014-10-15: qty 237

## 2014-10-15 MED ORDER — SODIUM CHLORIDE 0.9 % IV SOLN: 100.0000 mL | INTRAVENOUS | Status: DC | PRN

## 2014-10-15 MED ORDER — PENTAFLUOROPROP-TETRAFLUOROETH EX AERO: 1.0000 "application " | INHALATION_SPRAY | CUTANEOUS | Status: DC | PRN

## 2014-10-15 MED ORDER — LIDOCAINE HCL (PF) 1 % IJ SOLN: 5.0000 mL | INTRAMUSCULAR | Status: DC | PRN

## 2014-10-15 NOTE — Progress Notes (Signed)
[facility] Progress Note  Assessment/Plan: 1. s/p ventral hernia repair 2/1 - Dr. Abran Cantor- progressing well 2. ESRD - TTS Needs f'gram - reschedule for next Monday; Hd scheduled for today; cotninue no heparin due to post op status K 6.1 pre HD Tues down to 4.6 - ^ K could be related to dim fx of AVF - repeat labs pre HD today 3. Hypertension/volume - not getting to EDW - Dr. Moshe Cipro wanted to raise but pt declined - eval here; on coreg 6.25 bid Net UF 1.6 on Tues; BP ok 4. Anemia - on Mrcira - next dose due 2/11 - if Hgb declines - can give interim Aranesp - prior Aranesp dose Was 25 q 4 weeks; follow CBC Hgb 9.9 - not down that much - hold on ESA for now 5. Metabolic bone disease - iPTH increasing will increase calcitriol to 0.5 TTS- on fosrenol (not phoslo)- need to update outpt med list at d/c 6. Nutrition - diet advanced to solids for lunch; Alb 3.9 fairly well nourished prior to surgery 7. HIV/Hep C + 8. Gout 9. Hypothyroidism - on synthroid 10. Disp -anticipate d/c Friday Myriam Jacobson, PA-C [place] [phone] 10/15/2014,10:27 AM  LOS: 3 days   Renal Attending; Post op progressing. Renal issues as articulated aboev. For routine HD today. Access to be addressed as an outpt. Naftali Carchi C   Subjective:   passing gas, no BM yet, FL, D/C planned for Friday. He made appt with [facility] for f'gram on Monday; has been up walking in room  Objective Filed Vitals:   10/14/14 0447 10/14/14 1404 10/14/14 2200 10/15/14 0526  BP: 123/87 131/83 122/87 108/75  Pulse: 81 90 55 84  Temp: 97.7 F (36.5 C) 98.3 F (36.8 C) 98.1 F (36.7 C) 98.1 F (36.7 C)  TempSrc: Oral Oral Oral Oral  Resp: 18 17 15 18   Height:      Weight:      SpO2: 97% 100% 98% 96%   Physical Exam General: NAD sitting in chair Heart: RRR Lungs: no rales Abdomen: distended; binder in place + BS Extremities: no LE edema Dialysis Access: right lower AVF +  bruit  Dialysis Orders: [facility]: [facility] 180 4 hr EDW 101.5 3K 2.25 Ca right lower AVF 450/A 1.5 8600 heparin venofer 50/week, calcitriol 0.25 TIW and Mircera 50 q 2 weeks last given 1/ 26 Recent labs: Hgb 11 1/28 ferritin 560 tsat 32% iPTH 688 increased C/P ok Access issues: had a f'gram schedule for [facility] today - cancelled due to surgery - last intervention 11/20 but AF sig declining 1315 Dec 798 1/12 and 584 1/16   Additional Objective Labs: Basic Metabolic Panel:  Recent Labs Lab 10/12/14 0622 10/13/14 0458 10/14/14 0453  NA 134* 132* 131*  K 4.1 6.1* 4.6  CL  --  95* 90*  CO2  --  21 27  GLUCOSE 92 121* 124*  BUN  --  64* 40*  CREATININE  --  11.05* 7.69*  CALCIUM  --  9.5 9.5  CBC:  Recent Labs Lab 10/12/14 0622 10/13/14 0458 10/14/14 0453  WBC  --  11.1* 10.7*  HGB 11.2* 10.3* 9.9*  HCT 33.0* 30.9* 30.1*  MCV  --  104.0* 104.9*  PLT  --  182 165   B CBG:  Recent Labs Lab 10/12/14 1028 10/12/14 1158 10/13/14 1218  GLUCAP 95 116* 115*  Medications: . sodium chloride 10 mL/hr at 10/13/14 1615   . allopurinol  300 mg  Oral Daily  . calcitRIOL  0.5 mcg Oral Q T,Th,Sa-HD  . carvedilol  6.25 mg Oral BID WC  . Darunavir Ethanolate  800 mg Oral Q breakfast  . enoxaparin (LOVENOX) injection  30 mg Subcutaneous Q24H  . ferric gluconate (FERRLECIT/NULECIT) IV  62.5 mg Intravenous Q Tue-HD  . gabapentin  100 mg Oral TID  . lamiVUDine  50 mg Oral Daily  . lanthanum  1,000 mg Oral TID WC  . levothyroxine  50 mcg Oral QAC breakfast  . multivitamin  1 tablet Oral QHS  . raltegravir  400 mg Oral BID  . ritonavir  100 mg Oral Q breakfast  . zidovudine  300 mg Oral Daily

## 2014-10-15 NOTE — Progress Notes (Signed)
Patient ID: Bryan Gilmore., male   DOB: [birth year], 64 y.o.   MRN: [id number] 3 Days Post-Op  Subjective: No C/O, some incisional pain as expected managed with meds.  Tol FL without nausea, + flatus.  Ambulatory  Objective: Vital signs in last 24 hours: Temp:  [98.1 F (36.7 C)-98.3 F (36.8 C)] 98.1 F (36.7 C) (02/04 0526) Pulse Rate:  [55-90] 84 (02/04 0526) Resp:  [15-18] 18 (02/04 0526) BP: (108-131)/(75-87) 108/75 mmHg (02/04 0526) SpO2:  [96 %-100 %] 96 % (02/04 0526) Last BM Date: 10/12/14  Intake/Output from previous day: 02/03 0701 - 02/04 0700 In: 222 [P.O.:222] Out: -  Intake/Output this shift:    General appearance: alert, cooperative and no distress GI: normal findings: soft, non-tender Incision/Wound: Clean and dry  Lab Results:   Recent Labs  10/13/14 0458 10/14/14 0453  WBC 11.1* 10.7*  HGB 10.3* 9.9*  HCT 30.9* 30.1*  PLT 182 165   BMET  Recent Labs  10/13/14 0458 10/14/14 0453  NA 132* 131*  K 6.1* 4.6  CL 95* 90*  CO2 21 27  GLUCOSE 121* 124*  BUN 64* 40*  CREATININE 11.05* 7.69*  CALCIUM 9.5 9.5     Studies/Results: No results found.  Anti-infectives: Anti-infectives    Start     Dose/Rate Route Frequency Ordered Stop   10/13/14 0800  ritonavir (NORVIR) tablet 100 mg     100 mg Oral Daily with breakfast 10/12/14 1154     10/13/14 0800  Darunavir Ethanolate (PREZISTA) tablet 800 mg     800 mg Oral Daily with breakfast 10/12/14 1154     10/12/14 2200  raltegravir (ISENTRESS) tablet 400 mg     400 mg Oral 2 times daily 10/12/14 1154     10/12/14 1230  lamiVUDine (EPIVIR) 10 MG/ML solution 50 mg     50 mg Oral Daily 10/12/14 1227     10/12/14 1200  zidovudine (RETROVIR) capsule 300 mg     300 mg Oral Daily 10/12/14 1154     10/12/14 1200  lamivudine (EPIVIR) tablet 50 mg  Status:  Discontinued     50 mg Oral Daily 10/12/14 1154 10/12/14 1224   10/12/14 0600  ceFAZolin (ANCEF) IVPB 2 g/50 mL premix     2 g100 mL/hr over 30  Minutes Intravenous On call to O.R. 10/11/14 1353 10/12/14 0747      Assessment/Plan: s/p Procedure(s): REPAIR VENTRAL INCISIONAL HERNIA INSERTION OF MESH Doing well post op Advance to regular diet Renal following for dialysis needs Probable discharge tomorrow   LOS: 3 days    [name] T 10/15/2014

## 2014-10-16 MED ORDER — OXYCODONE-ACETAMINOPHEN 5-325 MG PO TABS: 1.0000 | ORAL_TABLET | Freq: Four times a day (QID) | ORAL | Status: DC | PRN

## 2014-10-16 NOTE — Progress Notes (Signed)
Patient ID: Bryan Gilmore., male   DOB: 12-14-1950, 64 y.o.   MRN: [id number] 4 Days Post-Op  Subjective: No complaints this morning. Tolerating regular diet. Having flatus. Denies pain or nausea.  Objective: Vital signs in last 24 hours: Temp:  [97.7 F (36.5 C)-100.3 F (37.9 C)] 99.3 F (37.4 C) (02/05 0539) Pulse Rate:  [64-83] 71 (02/05 0539) Resp:  [16-22] 18 (02/05 0539) BP: (109-162)/(69-90) 109/70 mmHg (02/05 0539) SpO2:  [96 %-99 %] 99 % (02/05 0539) Weight:  [232 lb 5.8 oz (105.4 kg)-235 lb 14.3 oz (107 kg)] 232 lb 5.8 oz (105.4 kg) (02/04 1632) Last BM Date: 10/12/14  Intake/Output from previous day: 02/04 0701 - 02/05 0700 In: 240 [P.O.:240] Out: 1923 [Urine:300] Intake/Output this shift: Total I/O In: 238 [P.O.:238] Out: -   General appearance: alert, cooperative and no distress GI: normal findings: soft, non-tender Incision/Wound: clean and dry without evidence of infection  Lab Results:   Recent Labs  10/14/14 0453 10/15/14 1403  WBC 10.7* 7.8  HGB 9.9* 8.9*  HCT 30.1* 25.6*  PLT 165 149*   BMET  Recent Labs  10/14/14 0453 10/15/14 1404  NA 131* 125*  K 4.6 3.9  CL 90* 86*  CO2 27 26  GLUCOSE 124* 122*  BUN 40* 63*  CREATININE 7.69* 9.71*  CALCIUM 9.5 9.1     Studies/Results: No results found.  Anti-infectives: Anti-infectives    Start     Dose/Rate Route Frequency Ordered Stop   10/13/14 0800  ritonavir (NORVIR) tablet 100 mg     100 mg Oral Daily with breakfast 10/12/14 1154     10/13/14 0800  Darunavir Ethanolate (PREZISTA) tablet 800 mg     800 mg Oral Daily with breakfast 10/12/14 1154     10/12/14 2200  raltegravir (ISENTRESS) tablet 400 mg     400 mg Oral 2 times daily 10/12/14 1154     10/12/14 1230  lamiVUDine (EPIVIR) 10 MG/ML solution 50 mg     50 mg Oral Daily 10/12/14 1227     10/12/14 1200  zidovudine (RETROVIR) capsule 300 mg     300 mg Oral Daily 10/12/14 1154     10/12/14 1200  lamivudine (EPIVIR) tablet 50 mg   Status:  Discontinued     50 mg Oral Daily 10/12/14 1154 10/12/14 1224   10/12/14 0600  ceFAZolin (ANCEF) IVPB 2 g/50 mL premix     2 g100 mL/hr over 30 Minutes Intravenous On call to O.R. 10/11/14 1353 10/12/14 0747      Assessment/Plan: s/p Procedure(s): REPAIR VENTRAL INCISIONAL HERNIA INSERTION OF MESH Doing well postoperatively without complication. Okay for discharge today.   LOS: 4 days    Aaliya Maultsby T 10/16/2014

## 2014-10-16 NOTE — Discharge Instructions (Signed)
[facility]      [facility] [phone]  OPEN ABDOMINAL SURGERY: POST OP INSTRUCTIONS  Always review your discharge instruction sheet given to you by the facility where your surgery was performed.  IF YOU HAVE DISABILITY OR FAMILY LEAVE FORMS, YOU MUST BRING THEM TO THE OFFICE FOR PROCESSING.  PLEASE DO NOT GIVE THEM TO YOUR DOCTOR.  1. A prescription for pain medication may be given to you upon discharge.  Take your pain medication as prescribed, if needed.  If narcotic pain medicine is not needed, then you may take acetaminophen (Tylenol) or ibuprofen (Advil) as needed. 2. Take your usually prescribed medications unless otherwise directed. 3. If you need a refill on your pain medication, please contact your pharmacy. They will contact our office to request authorization.  Prescriptions will not be filled after 5pm or on week-ends. 4. You should follow a light diet the first few days after arrival home, such as soup and crackers, pudding, etc.unless your doctor has advised otherwise. A high-fiber, low fat diet can be resumed as tolerated.   Be sure to include lots of fluids daily. Most patients will experience some swelling and bruising on the chest and neck area.  Ice packs will help.  Swelling and bruising can take several days to resolve 5. Most patients will experience some swelling and bruising in the area of the incision. Ice pack will help. Swelling and bruising can take several days to resolve..  6. It is common to experience some constipation if taking pain medication after surgery.  Increasing fluid intake and taking a stool softener will usually help or prevent this problem from occurring.  A mild laxative (Milk of Magnesia or Miralax) should be taken according to package directions if there are no bowel movements after 48 hours. 7.  You may have steri-strips (small skin tapes) in place directly over the incision.  These strips should be left on the skin for 7-10 days.  If your  surgeon used skin glue on the incision, you may shower in 24 hours.  The glue will flake off over the next 2-3 weeks.  Any sutures or staples will be removed at the office during your follow-up visit. You may find that a light gauze bandage over your incision may keep your staples from being rubbed or pulled. You may shower and replace the bandage daily. 8. ACTIVITIES:  You may resume regular (light) daily activities beginning the next day--such as daily self-care, walking, climbing stairs--gradually increasing activities as tolerated.  You may have sexual intercourse when it is comfortable.  Refrain from any heavy lifting or straining until approved by your doctor. a. You may drive when you no longer are taking prescription pain medication, you can comfortably wear a seatbelt, and you can safely maneuver your car and apply brakes b. Return to Work: ___________________________________ 9. You should see your doctor in the office for a follow-up appointment approximately two weeks after your surgery.  Make sure that you call for this appointment within a day or two after you arrive home to insure a convenient appointment time. OTHER INSTRUCTIONS:  _____________________________________________________________ _____________________________________________________________  WHEN TO CALL YOUR DOCTOR: 1. Fever over 101.0 2. Inability to urinate 3. Nausea and/or vomiting 4. Extreme swelling or bruising 5. Continued bleeding from incision. 6. Increased pain, redness, or drainage from the incision. 7. Difficulty swallowing or breathing 8. Muscle cramping or spasms. 9. Numbness or tingling in hands or feet or around lips.  The clinic staff is available to   answer your questions during regular business hours.  Please don't hesitate to call and ask to speak to one of the nurses if you have concerns.  For further questions, please visit [email or web address]   

## 2014-10-16 NOTE — Discharge Summary (Signed)
   Patient ID: Bryan Gilmore [id number] 64 y.o. 1950-09-29  10/12/2014  Discharge date and time: 10/16/2014   Admitting Physician: Excell Seltzer T  Discharge Physician: Excell Seltzer T  Admission Diagnoses: ventral incisional hernia  Discharge Diagnoses: Same  Operations: Procedure(s): REPAIR VENTRAL INCISIONAL HERNIA INSERTION OF MESH  Admission Condition: fair  Discharged Condition: fair  Indication for Admission: Patient is a 64 year old with multiple medical problems including end-stage renal disease and HIV status who has a history of Fournier's gangrene requiring diverting sigmoid colostomy and subsequent colostomy closure. He has developed a large symptomatic increasing midline ventral incisional hernia and after extensive discussion regarding treatment wrist detailed elsewhere he is electively admitted for repair  Hospital Course: On the morning of admission the patient underwent an uneventful repair of his large incisional hernia using a retrorectus technique with a large piece of Prolene mesh. His postoperative course was very uneventful. Renal service followed him for his dialysis needs. He had moderate pain well controlled. No nausea and his diet was progressively advanced to regular which she tolerated well. Abdomen remained nontender and incisions are healing nicely at the time of discharge. White blood count is normal.  Consults: nephrology   Treatments: dialysis: Hemodialysis  Disposition: Home  Patient Instructions:    Medication List    TAKE these medications        allopurinol 300 MG tablet  Commonly known as:  ZYLOPRIM  TAKE ONE TABLET BY MOUTH ONCE DAILY FOR GOUT     aspirin EC 325 MG tablet  Take 1 tablet (325 mg total) by mouth daily.     carvedilol 6.25 MG tablet  Commonly known as:  COREG  Take 1 tablet (6.25 mg total) by mouth 2 (two) times daily.     Darunavir Ethanolate 800 MG tablet  Commonly known as:  PREZISTA  Take 1 tablet  (800 mg total) by mouth daily with breakfast.     gabapentin 100 MG capsule  Commonly known as:  NEURONTIN  TAKE ONE CAPSULE BY MOUTH THREE TIMES DAILY     ISENTRESS 400 MG tablet  Generic drug:  raltegravir  TAKE ONE TABLET BY MOUTH BY MOUTH TWICE DAILY     lamivudine 100 MG tablet  Commonly known as:  EPIVIR  TAKE 1/2 TABLET BY MOUTH EVERY DAY     lanthanum 1000 MG chewable tablet  Commonly known as:  FOSRENOL  Chew 1,000 mg by mouth 3 (three) times daily with meals.     levothyroxine 50 MCG tablet  Commonly known as:  SYNTHROID, LEVOTHROID  Take 1 tablet (50 mcg total) by mouth daily.     NORVIR 100 MG Tabs tablet  Generic drug:  ritonavir  TAKE 1 TABLET BY MOUTH EVERY DAY     oxyCODONE-acetaminophen 5-325 MG per tablet  Commonly known as:  PERCOCET/ROXICET  Take 1 tablet by mouth every 6 (six) hours as needed for moderate pain.     zidovudine 300 MG tablet  Commonly known as:  RETROVIR  TAKE ONE TABLET BY MOUTH EVERY DAY        Activity: no heavy lifting for 4 weeks Diet: renal diet Wound Care: none needed  Follow-up:  With Dr. Excell Seltzer in 10 days.  Signed: [name] MD, FACS  10/16/2014, 9:58 AM

## 2014-10-19 DIAGNOSIS — T82858D Stenosis of vascular prosthetic devices, implants and grafts, subsequent encounter: Secondary | ICD-10-CM | POA: Diagnosis not present

## 2014-10-19 DIAGNOSIS — N186 End stage renal disease: Secondary | ICD-10-CM | POA: Diagnosis not present

## 2014-10-19 DIAGNOSIS — Z992 Dependence on renal dialysis: Secondary | ICD-10-CM | POA: Diagnosis not present

## 2014-10-19 DIAGNOSIS — I871 Compression of vein: Secondary | ICD-10-CM | POA: Diagnosis not present

## 2014-10-20 ENCOUNTER — Ambulatory Visit: Payer: BC Managed Care – PPO | Admitting: Internal Medicine

## 2014-11-04 ENCOUNTER — Encounter: Payer: Self-pay | Admitting: Internal Medicine

## 2014-11-04 ENCOUNTER — Ambulatory Visit (INDEPENDENT_AMBULATORY_CARE_PROVIDER_SITE_OTHER): Payer: BC Managed Care – PPO | Admitting: Internal Medicine

## 2014-11-04 VITALS — BP 138/72 | HR 58 | Temp 97.7°F | Ht 71.0 in | Wt 223.0 lb

## 2014-11-04 DIAGNOSIS — I10 Essential (primary) hypertension: Secondary | ICD-10-CM

## 2014-11-04 DIAGNOSIS — N184 Chronic kidney disease, stage 4 (severe): Secondary | ICD-10-CM | POA: Diagnosis not present

## 2014-11-04 DIAGNOSIS — D631 Anemia in chronic kidney disease: Secondary | ICD-10-CM | POA: Diagnosis not present

## 2014-11-04 DIAGNOSIS — T8131XA Disruption of external operation (surgical) wound, not elsewhere classified, initial encounter: Secondary | ICD-10-CM

## 2014-11-04 DIAGNOSIS — Z9889 Other specified postprocedural states: Secondary | ICD-10-CM

## 2014-11-04 DIAGNOSIS — E039 Hypothyroidism, unspecified: Secondary | ICD-10-CM

## 2014-11-04 DIAGNOSIS — Z8719 Personal history of other diseases of the digestive system: Secondary | ICD-10-CM

## 2014-11-04 DIAGNOSIS — N186 End stage renal disease: Secondary | ICD-10-CM | POA: Diagnosis not present

## 2014-11-04 MED ORDER — CARVEDILOL 6.25 MG PO TABS: 6.2500 mg | ORAL_TABLET | Freq: Two times a day (BID) | ORAL | Status: DC

## 2014-11-04 NOTE — Patient Instructions (Signed)
Change abdominal dressing when bandage soaked  Keep appt with Dr Excell Seltzer  Continue current medications as ordered  Watch salt intake. Increase aerobic exercise (like walking) as tolerated to help with weight loss

## 2014-11-04 NOTE — Progress Notes (Signed)
Patient ID: Bryan Gilmore., male   DOB: 1950-12-11, 64 y.o.   MRN: [id number]    Facility  [facility]    Place of Service:   OFFICE   No Known Allergies  Chief Complaint  Patient presents with  . Hospitalization Follow-up    Patient was seen in the hospital for a hernia, patient had hernia surgery. Patient would like surgery site examined    HPI:  64 yo male seen today for above. He is s/p large ventral incisional hernia repair with mesh on 10/12/14 by Dr Excell Seltzer. No complications occurred and he had an uneventful stay in the hospital. He was seen by nephro for HD. He f/u with surgery as instructed. He c/o "hole" in his stomach and is being followed closely by surgery. He noticed a light colored d/c. Pain in abdomen with palpation. Opening was packed by surgery yesterday. He will see Dr Excell Seltzer tomorrow. he denies CP, SOB, f/c. No N/V. Appetite reduced.   He needs a RF on coreg.   Medications: Patient's Medications  New Prescriptions   No medications on file  Previous Medications   ALLOPURINOL (ZYLOPRIM) 300 MG TABLET    TAKE ONE TABLET BY MOUTH ONCE DAILY FOR GOUT   ASPIRIN EC 325 MG TABLET    Take 1 tablet (325 mg total) by mouth daily.   CARVEDILOL (COREG) 6.25 MG TABLET    Take 1 tablet (6.25 mg total) by mouth 2 (two) times daily.   DARUNAVIR ETHANOLATE (PREZISTA) 800 MG TABLET    Take 1 tablet (800 mg total) by mouth daily with breakfast.   GABAPENTIN (NEURONTIN) 100 MG CAPSULE    TAKE ONE CAPSULE BY MOUTH THREE TIMES DAILY   ISENTRESS 400 MG TABLET    TAKE ONE TABLET BY MOUTH BY MOUTH TWICE DAILY   LAMIVUDINE (EPIVIR) 100 MG TABLET    TAKE 1/2 TABLET BY MOUTH EVERY DAY   LANTHANUM (FOSRENOL) 1000 MG CHEWABLE TABLET    Chew 1,000 mg by mouth 3 (three) times daily with meals.    LEVOTHYROXINE (SYNTHROID, LEVOTHROID) 50 MCG TABLET    Take 1 tablet (50 mcg total) by mouth daily.   NORVIR 100 MG TABS TABLET    TAKE 1 TABLET BY MOUTH EVERY DAY   OXYCODONE-ACETAMINOPHEN  (PERCOCET/ROXICET) 5-325 MG PER TABLET    Take 1 tablet by mouth every 6 (six) hours as needed for moderate pain.   ZIDOVUDINE (RETROVIR) 300 MG TABLET    TAKE ONE TABLET BY MOUTH EVERY DAY  Modified Medications   No medications on file  Discontinued Medications   No medications on file     Review of Systems  As above. All other systems reviewed are negative.  Filed Vitals:   11/04/14 1030  BP: 138/72  Pulse: 58  Temp: 97.7 F (36.5 C)  TempSrc: Oral  Height: '5\' 11"'  (1.803 m)  Weight: 223 lb (101.152 kg)  SpO2: 99%   Body mass index is 31.12 kg/(m^2).  Physical Exam CONSTITUTIONAL: Looks well in NAD. Awake, alert and oriented x 3 HEENT: PERRLA. No scleral icterus. Oropharynx clear and without exudate. MMM. Poor dentition NECK: Supple. Nontender. No palpable cervical or supraclavicular lymph nodes. No carotid bruit b/l. left thyromegaly but no thyroid mass palpable.  CVS: Regular rate without murmur, gallop or rub. LUNGS: CTA b/l no wheezing, rales or rhonchi. ABDOMEN: Bowel sounds present x 4. Soft, nondistended. No palpable mass or bruit. TTP at bandage site. Bandage is soiled. Serosanguinous d/c.  EXTREMITIES: No edema b/l.  Distal pulses palpable. No calf tenderness PSYCH: Affect, behavior and mood normal   Labs reviewed: Admission on 10/12/2014, Discharged on 10/16/2014  Component Date Value Ref Range Status  . Sodium 10/12/2014 134* 135 - 145 mmol/L Final  . Potassium 10/12/2014 4.1  3.5 - 5.1 mmol/L Final  . Glucose, Bld 10/12/2014 92  70 - 99 mg/dL Final  . HCT 10/12/2014 33.0* 39.0 - 52.0 % Final  . Hemoglobin 10/12/2014 11.2* 13.0 - 17.0 g/dL Final  . Glucose-Capillary 10/12/2014 95  70 - 99 mg/dL Final  . Comment 1 10/12/2014 Documented in Chart   Final  . Comment 2 10/12/2014 Notify RN   Final  . Glucose-Capillary 10/12/2014 116* 70 - 99 mg/dL Final  . WBC 10/13/2014 11.1* 4.0 - 10.5 K/uL Final  . RBC 10/13/2014 2.97* 4.22 - 5.81 MIL/uL Final  . Hemoglobin  10/13/2014 10.3* 13.0 - 17.0 g/dL Final  . HCT 10/13/2014 30.9* 39.0 - 52.0 % Final  . MCV 10/13/2014 104.0* 78.0 - 100.0 fL Final  . MCH 10/13/2014 34.7* 26.0 - 34.0 pg Final  . MCHC 10/13/2014 33.3  30.0 - 36.0 g/dL Final  . RDW 10/13/2014 13.4  11.5 - 15.5 % Final  . Platelets 10/13/2014 182  150 - 400 K/uL Final  . Sodium 10/13/2014 132* 135 - 145 mmol/L Final  . Potassium 10/13/2014 6.1* 3.5 - 5.1 mmol/L Final   Comment: REPEATED TO VERIFY NO VISIBLE HEMOLYSIS CRITICAL RESULT CALLED TO, READ BACK BY AND VERIFIED WITH: V.THOMPSON,RN 10/13/14 0712 BY BSLADE   . Chloride 10/13/2014 95* 96 - 112 mmol/L Final  . CO2 10/13/2014 21  19 - 32 mmol/L Final  . Glucose, Bld 10/13/2014 121* 70 - 99 mg/dL Final  . BUN 10/13/2014 64* 6 - 23 mg/dL Final  . Creatinine, Ser 10/13/2014 11.05* 0.50 - 1.35 mg/dL Final  . Calcium 10/13/2014 9.5  8.4 - 10.5 mg/dL Final  . GFR calc non Af Amer 10/13/2014 4* >90 mL/min Final  . GFR calc Af Amer 10/13/2014 5* >90 mL/min Final   Comment: (NOTE) The eGFR has been calculated using the CKD EPI equation. This calculation has not been validated in all clinical situations. eGFR's persistently <90 mL/min signify possible Chronic Kidney Disease.   . Anion gap 10/13/2014 16* 5 - 15 Final  . Hepatitis B Surface Ag 10/13/2014 NEGATIVE  NEGATIVE Final   Performed at [facility]  . Glucose-Capillary 10/13/2014 115* 70 - 99 mg/dL Final  . WBC 10/14/2014 10.7* 4.0 - 10.5 K/uL Final  . RBC 10/14/2014 2.87* 4.22 - 5.81 MIL/uL Final  . Hemoglobin 10/14/2014 9.9* 13.0 - 17.0 g/dL Final  . HCT 10/14/2014 30.1* 39.0 - 52.0 % Final  . MCV 10/14/2014 104.9* 78.0 - 100.0 fL Final  . MCH 10/14/2014 34.5* 26.0 - 34.0 pg Final  . MCHC 10/14/2014 32.9  30.0 - 36.0 g/dL Final  . RDW 10/14/2014 13.6  11.5 - 15.5 % Final  . Platelets 10/14/2014 165  150 - 400 K/uL Final  . Sodium 10/14/2014 131* 135 - 145 mmol/L Final  . Potassium 10/14/2014 4.6  3.5 - 5.1 mmol/L Final     DELTA CHECK NOTED  . Chloride 10/14/2014 90* 96 - 112 mmol/L Final  . CO2 10/14/2014 27  19 - 32 mmol/L Final  . Glucose, Bld 10/14/2014 124* 70 - 99 mg/dL Final  . BUN 10/14/2014 40* 6 - 23 mg/dL Final   DELTA CHECK NOTED  . Creatinine, Ser 10/14/2014 7.69* 0.50 - 1.35 mg/dL  Final  . Calcium 10/14/2014 9.5  8.4 - 10.5 mg/dL Final  . GFR calc non Af Amer 10/14/2014 7* >90 mL/min Final  . GFR calc Af Amer 10/14/2014 8* >90 mL/min Final   Comment: (NOTE) The eGFR has been calculated using the CKD EPI equation. This calculation has not been validated in all clinical situations. eGFR's persistently <90 mL/min signify possible Chronic Kidney Disease.   . Anion gap 10/14/2014 14  5 - 15 Final  . WBC 10/15/2014 7.8  4.0 - 10.5 K/uL Final  . RBC 10/15/2014 2.52* 4.22 - 5.81 MIL/uL Final  . Hemoglobin 10/15/2014 8.9* 13.0 - 17.0 g/dL Final  . HCT 10/15/2014 25.6* 39.0 - 52.0 % Final  . MCV 10/15/2014 101.6* 78.0 - 100.0 fL Final  . MCH 10/15/2014 35.3* 26.0 - 34.0 pg Final  . MCHC 10/15/2014 34.8  30.0 - 36.0 g/dL Final  . RDW 10/15/2014 12.8  11.5 - 15.5 % Final  . Platelets 10/15/2014 149* 150 - 400 K/uL Final  . Sodium 10/15/2014 125* 135 - 145 mmol/L Final  . Potassium 10/15/2014 3.9  3.5 - 5.1 mmol/L Final  . Chloride 10/15/2014 86* 96 - 112 mmol/L Final  . CO2 10/15/2014 26  19 - 32 mmol/L Final  . Glucose, Bld 10/15/2014 122* 70 - 99 mg/dL Final  . BUN 10/15/2014 63* 6 - 23 mg/dL Final  . Creatinine, Ser 10/15/2014 9.71* 0.50 - 1.35 mg/dL Final  . Calcium 10/15/2014 9.1  8.4 - 10.5 mg/dL Final  . Phosphorus 10/15/2014 8.3* 2.3 - 4.6 mg/dL Final  . Albumin 10/15/2014 3.2* 3.5 - 5.2 g/dL Final  . GFR calc non Af Amer 10/15/2014 5* >90 mL/min Final  . GFR calc Af Amer 10/15/2014 6* >90 mL/min Final   Comment: (NOTE) The eGFR has been calculated using the CKD EPI equation. This calculation has not been validated in all clinical situations. eGFR's persistently <90 mL/min signify  possible Chronic Kidney Disease.   Georgiann Hahn gap 10/15/2014 13  5 - 15 Final  Hospital Outpatient Visit on 10/07/2014  Component Date Value Ref Range Status  . MRSA, PCR 10/07/2014 NEGATIVE  NEGATIVE Final  . Staphylococcus aureus 10/07/2014 NEGATIVE  NEGATIVE Final   Comment:        The Xpert SA Assay (FDA approved for NASAL specimens in patients over 61 years of age), is one component of a comprehensive surveillance program.  Test performance has been validated by [facility] for patients greater than or equal to 46 year old. It is not intended to diagnose infection nor to guide or monitor treatment.   . Sodium 10/07/2014 136  135 - 145 mmol/L Final  . Potassium 10/07/2014 3.9  3.5 - 5.1 mmol/L Final  . Chloride 10/07/2014 94* 96 - 112 mmol/L Final  . CO2 10/07/2014 29  19 - 32 mmol/L Final  . Glucose, Bld 10/07/2014 124* 70 - 99 mg/dL Final  . BUN 10/07/2014 32* 6 - 23 mg/dL Final  . Creatinine, Ser 10/07/2014 8.30* 0.50 - 1.35 mg/dL Final  . Calcium 10/07/2014 9.7  8.4 - 10.5 mg/dL Final  . Total Protein 10/07/2014 8.6* 6.0 - 8.3 g/dL Final  . Albumin 10/07/2014 3.9  3.5 - 5.2 g/dL Final  . AST 10/07/2014 17  0 - 37 U/L Final  . ALT 10/07/2014 14  0 - 53 U/L Final  . Alkaline Phosphatase 10/07/2014 74  39 - 117 U/L Final  . Total Bilirubin 10/07/2014 0.8  0.3 - 1.2 mg/dL Final  .  GFR calc non Af Amer 10/07/2014 6* >90 mL/min Final  . GFR calc Af Amer 10/07/2014 7* >90 mL/min Final   Comment: (NOTE) The eGFR has been calculated using the CKD EPI equation. This calculation has not been validated in all clinical situations. eGFR's persistently <90 mL/min signify possible Chronic Kidney Disease.   . Anion gap 10/07/2014 13  5 - 15 Final  . WBC 10/07/2014 5.7  4.0 - 10.5 K/uL Final  . RBC 10/07/2014 3.35* 4.22 - 5.81 MIL/uL Final  . Hemoglobin 10/07/2014 11.9* 13.0 - 17.0 g/dL Final  . HCT 10/07/2014 34.6* 39.0 - 52.0 % Final  . MCV 10/07/2014 103.3* 78.0 - 100.0 fL Final   . MCH 10/07/2014 35.5* 26.0 - 34.0 pg Final  . MCHC 10/07/2014 34.4  30.0 - 36.0 g/dL Final  . RDW 10/07/2014 12.7  11.5 - 15.5 % Final  . Platelets 10/07/2014 167  150 - 400 K/uL Final  Orders Only on 08/31/2014  Component Date Value Ref Range Status  . TSH 08/31/2014 6.190* 0.450 - 4.500 uIU/mL Final  Lab on 08/10/2014  Component Date Value Ref Range Status  . HCV Quantitative 08/10/2014 Not Detected  <15 IU/mL Final   Comment: No detectable level of HCV RNA.     Marland Kitchen HCV Quantitative Log 08/10/2014 NOT CALC  <1.18 log 10 Final   Comment:   This test utilizes the [place] FDA approved Roche HCV Test Kit by RT-PCR.     Hospital records reviewed  Assessment/Plan   ICD-9-CM ICD-10-CM   1. S/P repair of ventral hernia V45.89 Z98.89    with mesh  2. Surgical wound dehiscence, initial encounter 998.32 T81.31XA    healing   3. Essential hypertension - controlled 401.9 I10   4. Hypothyroidism, unspecified hypothyroidism type 244.9 E03.9   5. ESRD (end stage renal disease) 585.6 N18.6    HD on TThSa via right forearm AVF  6. Anemia of chronic renal failure, stage 4 (severe) 285.21 N18.4    585.4 D63.1     --f/u with surgery as scheduled on Friday. Need to obtain surgery office notes.  --continue dressing changes as indicated. He will change when he gets home  --labs reviewed with pt. He will need TSH drawn at next office visit  --keep appt with nephro. HD TThSa  --continue all other meds as ordered. New Rx coreg sent to pharmacy  --keep appt with ID as scheduled  --RTO next month with [name] as scheduled  Pol[place] S. Perlie Gold  [facility] [address] [place], [place] [address] [phone] Office (Wednesdays and Fridays 8 AM - 5 PM) [phone] Cell (Monday-Friday 8 AM - 5 PM)

## 2014-11-08 DIAGNOSIS — I120 Hypertensive chronic kidney disease with stage 5 chronic kidney disease or end stage renal disease: Secondary | ICD-10-CM | POA: Diagnosis not present

## 2014-11-08 DIAGNOSIS — T814XXD Infection following a procedure, subsequent encounter: Secondary | ICD-10-CM | POA: Diagnosis not present

## 2014-11-08 DIAGNOSIS — Z992 Dependence on renal dialysis: Secondary | ICD-10-CM | POA: Diagnosis not present

## 2014-11-08 DIAGNOSIS — N186 End stage renal disease: Secondary | ICD-10-CM | POA: Diagnosis not present

## 2014-11-08 DIAGNOSIS — F17210 Nicotine dependence, cigarettes, uncomplicated: Secondary | ICD-10-CM | POA: Diagnosis not present

## 2014-11-08 DIAGNOSIS — Z21 Asymptomatic human immunodeficiency virus [HIV] infection status: Secondary | ICD-10-CM | POA: Diagnosis not present

## 2014-11-09 DIAGNOSIS — I120 Hypertensive chronic kidney disease with stage 5 chronic kidney disease or end stage renal disease: Secondary | ICD-10-CM | POA: Diagnosis not present

## 2014-11-09 DIAGNOSIS — F17210 Nicotine dependence, cigarettes, uncomplicated: Secondary | ICD-10-CM | POA: Diagnosis not present

## 2014-11-09 DIAGNOSIS — Z992 Dependence on renal dialysis: Secondary | ICD-10-CM | POA: Diagnosis not present

## 2014-11-09 DIAGNOSIS — T814XXD Infection following a procedure, subsequent encounter: Secondary | ICD-10-CM | POA: Diagnosis not present

## 2014-11-09 DIAGNOSIS — N186 End stage renal disease: Secondary | ICD-10-CM | POA: Diagnosis not present

## 2014-11-09 DIAGNOSIS — Z21 Asymptomatic human immunodeficiency virus [HIV] infection status: Secondary | ICD-10-CM | POA: Diagnosis not present

## 2014-11-10 DIAGNOSIS — I120 Hypertensive chronic kidney disease with stage 5 chronic kidney disease or end stage renal disease: Secondary | ICD-10-CM | POA: Diagnosis not present

## 2014-11-10 DIAGNOSIS — D631 Anemia in chronic kidney disease: Secondary | ICD-10-CM | POA: Diagnosis not present

## 2014-11-10 DIAGNOSIS — F17210 Nicotine dependence, cigarettes, uncomplicated: Secondary | ICD-10-CM | POA: Diagnosis not present

## 2014-11-10 DIAGNOSIS — R6883 Chills (without fever): Secondary | ICD-10-CM | POA: Diagnosis not present

## 2014-11-10 DIAGNOSIS — N186 End stage renal disease: Secondary | ICD-10-CM | POA: Diagnosis not present

## 2014-11-10 DIAGNOSIS — Z992 Dependence on renal dialysis: Secondary | ICD-10-CM | POA: Diagnosis not present

## 2014-11-10 DIAGNOSIS — T814XXD Infection following a procedure, subsequent encounter: Secondary | ICD-10-CM | POA: Diagnosis not present

## 2014-11-10 DIAGNOSIS — Z21 Asymptomatic human immunodeficiency virus [HIV] infection status: Secondary | ICD-10-CM | POA: Diagnosis not present

## 2014-11-10 DIAGNOSIS — D509 Iron deficiency anemia, unspecified: Secondary | ICD-10-CM | POA: Diagnosis not present

## 2014-11-10 DIAGNOSIS — A498 Other bacterial infections of unspecified site: Secondary | ICD-10-CM | POA: Diagnosis not present

## 2014-11-10 DIAGNOSIS — N2581 Secondary hyperparathyroidism of renal origin: Secondary | ICD-10-CM | POA: Diagnosis not present

## 2014-11-11 ENCOUNTER — Other Ambulatory Visit (INDEPENDENT_AMBULATORY_CARE_PROVIDER_SITE_OTHER): Payer: Medicare Other

## 2014-11-11 DIAGNOSIS — Z113 Encounter for screening for infections with a predominantly sexual mode of transmission: Secondary | ICD-10-CM

## 2014-11-11 DIAGNOSIS — Z21 Asymptomatic human immunodeficiency virus [HIV] infection status: Secondary | ICD-10-CM | POA: Diagnosis not present

## 2014-11-11 DIAGNOSIS — B182 Chronic viral hepatitis C: Secondary | ICD-10-CM | POA: Diagnosis not present

## 2014-11-11 LAB — BASIC METABOLIC PANEL
BUN: 16 mg/dL (ref 6–23)
CALCIUM: 9.3 mg/dL (ref 8.4–10.5)
CO2: 28 meq/L (ref 19–32)
Chloride: 98 mEq/L (ref 96–112)
Creat: 5.63 mg/dL — ABNORMAL HIGH (ref 0.50–1.35)
GLUCOSE: 96 mg/dL (ref 70–99)
Potassium: 4.2 mEq/L (ref 3.5–5.3)
SODIUM: 138 meq/L (ref 135–145)

## 2014-11-11 LAB — CBC WITH DIFFERENTIAL/PLATELET
Basophils Absolute: 0 10*3/uL (ref 0.0–0.1)
Basophils Relative: 1 % (ref 0–1)
Eosinophils Absolute: 0.1 10*3/uL (ref 0.0–0.7)
Eosinophils Relative: 2 % (ref 0–5)
HCT: 25.3 % — ABNORMAL LOW (ref 39.0–52.0)
Hemoglobin: 8.1 g/dL — ABNORMAL LOW (ref 13.0–17.0)
LYMPHS PCT: 34 % (ref 12–46)
Lymphs Abs: 1.5 10*3/uL (ref 0.7–4.0)
MCH: 33.2 pg (ref 26.0–34.0)
MCHC: 32 g/dL (ref 30.0–36.0)
MCV: 103.7 fL — ABNORMAL HIGH (ref 78.0–100.0)
MONO ABS: 0.3 10*3/uL (ref 0.1–1.0)
MONOS PCT: 8 % (ref 3–12)
MPV: 8.5 fL — ABNORMAL LOW (ref 8.6–12.4)
Neutro Abs: 2.4 10*3/uL (ref 1.7–7.7)
Neutrophils Relative %: 55 % (ref 43–77)
PLATELETS: 258 10*3/uL (ref 150–400)
RBC: 2.44 MIL/uL — ABNORMAL LOW (ref 4.22–5.81)
RDW: 14 % (ref 11.5–15.5)
WBC: 4.3 10*3/uL (ref 4.0–10.5)

## 2014-11-12 DIAGNOSIS — D509 Iron deficiency anemia, unspecified: Secondary | ICD-10-CM | POA: Diagnosis not present

## 2014-11-12 DIAGNOSIS — Z992 Dependence on renal dialysis: Secondary | ICD-10-CM | POA: Diagnosis not present

## 2014-11-12 DIAGNOSIS — Z21 Asymptomatic human immunodeficiency virus [HIV] infection status: Secondary | ICD-10-CM | POA: Diagnosis not present

## 2014-11-12 DIAGNOSIS — N186 End stage renal disease: Secondary | ICD-10-CM | POA: Diagnosis not present

## 2014-11-12 DIAGNOSIS — F17210 Nicotine dependence, cigarettes, uncomplicated: Secondary | ICD-10-CM | POA: Diagnosis not present

## 2014-11-12 DIAGNOSIS — I120 Hypertensive chronic kidney disease with stage 5 chronic kidney disease or end stage renal disease: Secondary | ICD-10-CM | POA: Diagnosis not present

## 2014-11-12 DIAGNOSIS — D631 Anemia in chronic kidney disease: Secondary | ICD-10-CM | POA: Diagnosis not present

## 2014-11-12 DIAGNOSIS — R6883 Chills (without fever): Secondary | ICD-10-CM | POA: Diagnosis not present

## 2014-11-12 DIAGNOSIS — T814XXD Infection following a procedure, subsequent encounter: Secondary | ICD-10-CM | POA: Diagnosis not present

## 2014-11-12 DIAGNOSIS — N2581 Secondary hyperparathyroidism of renal origin: Secondary | ICD-10-CM | POA: Diagnosis not present

## 2014-11-12 DIAGNOSIS — A498 Other bacterial infections of unspecified site: Secondary | ICD-10-CM | POA: Diagnosis not present

## 2014-11-12 LAB — RPR

## 2014-11-12 LAB — T-HELPER CELL (CD4) - (RCID CLINIC ONLY)
CD4 % Helper T Cell: 36 % (ref 33–55)
CD4 T Cell Abs: 460 /uL (ref 400–2700)

## 2014-11-12 LAB — HIV-1 RNA QUANT-NO REFLEX-BLD
HIV 1 RNA Quant: <20 copies/mL (ref <20)
HIV-1 RNA Quant, Log: <1.3 {Log} (ref <1.30)

## 2014-11-12 LAB — HEPATITIS C RNA QUANTITATIVE: HCV QUANT: NOT DETECTED [IU]/mL (ref <15)

## 2014-11-13 DIAGNOSIS — F17210 Nicotine dependence, cigarettes, uncomplicated: Secondary | ICD-10-CM | POA: Diagnosis not present

## 2014-11-13 DIAGNOSIS — Z21 Asymptomatic human immunodeficiency virus [HIV] infection status: Secondary | ICD-10-CM | POA: Diagnosis not present

## 2014-11-13 DIAGNOSIS — I120 Hypertensive chronic kidney disease with stage 5 chronic kidney disease or end stage renal disease: Secondary | ICD-10-CM | POA: Diagnosis not present

## 2014-11-13 DIAGNOSIS — N186 End stage renal disease: Secondary | ICD-10-CM | POA: Diagnosis not present

## 2014-11-13 DIAGNOSIS — T814XXD Infection following a procedure, subsequent encounter: Secondary | ICD-10-CM | POA: Diagnosis not present

## 2014-11-13 DIAGNOSIS — Z992 Dependence on renal dialysis: Secondary | ICD-10-CM | POA: Diagnosis not present

## 2014-11-14 DIAGNOSIS — I120 Hypertensive chronic kidney disease with stage 5 chronic kidney disease or end stage renal disease: Secondary | ICD-10-CM | POA: Diagnosis not present

## 2014-11-14 DIAGNOSIS — R6883 Chills (without fever): Secondary | ICD-10-CM | POA: Diagnosis not present

## 2014-11-14 DIAGNOSIS — D509 Iron deficiency anemia, unspecified: Secondary | ICD-10-CM | POA: Diagnosis not present

## 2014-11-14 DIAGNOSIS — Z21 Asymptomatic human immunodeficiency virus [HIV] infection status: Secondary | ICD-10-CM | POA: Diagnosis not present

## 2014-11-14 DIAGNOSIS — A498 Other bacterial infections of unspecified site: Secondary | ICD-10-CM | POA: Diagnosis not present

## 2014-11-14 DIAGNOSIS — Z992 Dependence on renal dialysis: Secondary | ICD-10-CM | POA: Diagnosis not present

## 2014-11-14 DIAGNOSIS — T814XXD Infection following a procedure, subsequent encounter: Secondary | ICD-10-CM | POA: Diagnosis not present

## 2014-11-14 DIAGNOSIS — F17210 Nicotine dependence, cigarettes, uncomplicated: Secondary | ICD-10-CM | POA: Diagnosis not present

## 2014-11-14 DIAGNOSIS — N186 End stage renal disease: Secondary | ICD-10-CM | POA: Diagnosis not present

## 2014-11-14 DIAGNOSIS — D631 Anemia in chronic kidney disease: Secondary | ICD-10-CM | POA: Diagnosis not present

## 2014-11-14 DIAGNOSIS — N2581 Secondary hyperparathyroidism of renal origin: Secondary | ICD-10-CM | POA: Diagnosis not present

## 2014-11-15 DIAGNOSIS — I120 Hypertensive chronic kidney disease with stage 5 chronic kidney disease or end stage renal disease: Secondary | ICD-10-CM | POA: Diagnosis not present

## 2014-11-15 DIAGNOSIS — Z992 Dependence on renal dialysis: Secondary | ICD-10-CM | POA: Diagnosis not present

## 2014-11-15 DIAGNOSIS — T814XXD Infection following a procedure, subsequent encounter: Secondary | ICD-10-CM | POA: Diagnosis not present

## 2014-11-15 DIAGNOSIS — Z21 Asymptomatic human immunodeficiency virus [HIV] infection status: Secondary | ICD-10-CM | POA: Diagnosis not present

## 2014-11-15 DIAGNOSIS — F17210 Nicotine dependence, cigarettes, uncomplicated: Secondary | ICD-10-CM | POA: Diagnosis not present

## 2014-11-15 DIAGNOSIS — N186 End stage renal disease: Secondary | ICD-10-CM | POA: Diagnosis not present

## 2014-11-16 DIAGNOSIS — N186 End stage renal disease: Secondary | ICD-10-CM | POA: Diagnosis not present

## 2014-11-16 DIAGNOSIS — T814XXD Infection following a procedure, subsequent encounter: Secondary | ICD-10-CM | POA: Diagnosis not present

## 2014-11-16 DIAGNOSIS — Z992 Dependence on renal dialysis: Secondary | ICD-10-CM | POA: Diagnosis not present

## 2014-11-16 DIAGNOSIS — I120 Hypertensive chronic kidney disease with stage 5 chronic kidney disease or end stage renal disease: Secondary | ICD-10-CM | POA: Diagnosis not present

## 2014-11-16 DIAGNOSIS — F17210 Nicotine dependence, cigarettes, uncomplicated: Secondary | ICD-10-CM | POA: Diagnosis not present

## 2014-11-16 DIAGNOSIS — Z21 Asymptomatic human immunodeficiency virus [HIV] infection status: Secondary | ICD-10-CM | POA: Diagnosis not present

## 2014-11-17 DIAGNOSIS — D509 Iron deficiency anemia, unspecified: Secondary | ICD-10-CM | POA: Diagnosis not present

## 2014-11-17 DIAGNOSIS — N186 End stage renal disease: Secondary | ICD-10-CM | POA: Diagnosis not present

## 2014-11-17 DIAGNOSIS — D631 Anemia in chronic kidney disease: Secondary | ICD-10-CM | POA: Diagnosis not present

## 2014-11-17 DIAGNOSIS — R6883 Chills (without fever): Secondary | ICD-10-CM | POA: Diagnosis not present

## 2014-11-17 DIAGNOSIS — I120 Hypertensive chronic kidney disease with stage 5 chronic kidney disease or end stage renal disease: Secondary | ICD-10-CM | POA: Diagnosis not present

## 2014-11-17 DIAGNOSIS — Z21 Asymptomatic human immunodeficiency virus [HIV] infection status: Secondary | ICD-10-CM | POA: Diagnosis not present

## 2014-11-17 DIAGNOSIS — T814XXD Infection following a procedure, subsequent encounter: Secondary | ICD-10-CM | POA: Diagnosis not present

## 2014-11-17 DIAGNOSIS — F17210 Nicotine dependence, cigarettes, uncomplicated: Secondary | ICD-10-CM | POA: Diagnosis not present

## 2014-11-17 DIAGNOSIS — Z992 Dependence on renal dialysis: Secondary | ICD-10-CM | POA: Diagnosis not present

## 2014-11-17 DIAGNOSIS — A498 Other bacterial infections of unspecified site: Secondary | ICD-10-CM | POA: Diagnosis not present

## 2014-11-17 DIAGNOSIS — N2581 Secondary hyperparathyroidism of renal origin: Secondary | ICD-10-CM | POA: Diagnosis not present

## 2014-11-18 DIAGNOSIS — Z992 Dependence on renal dialysis: Secondary | ICD-10-CM | POA: Diagnosis not present

## 2014-11-18 DIAGNOSIS — F17210 Nicotine dependence, cigarettes, uncomplicated: Secondary | ICD-10-CM | POA: Diagnosis not present

## 2014-11-18 DIAGNOSIS — Z21 Asymptomatic human immunodeficiency virus [HIV] infection status: Secondary | ICD-10-CM | POA: Diagnosis not present

## 2014-11-18 DIAGNOSIS — I120 Hypertensive chronic kidney disease with stage 5 chronic kidney disease or end stage renal disease: Secondary | ICD-10-CM | POA: Diagnosis not present

## 2014-11-18 DIAGNOSIS — N186 End stage renal disease: Secondary | ICD-10-CM | POA: Diagnosis not present

## 2014-11-18 DIAGNOSIS — T814XXD Infection following a procedure, subsequent encounter: Secondary | ICD-10-CM | POA: Diagnosis not present

## 2014-11-19 DIAGNOSIS — D631 Anemia in chronic kidney disease: Secondary | ICD-10-CM | POA: Diagnosis not present

## 2014-11-19 DIAGNOSIS — I120 Hypertensive chronic kidney disease with stage 5 chronic kidney disease or end stage renal disease: Secondary | ICD-10-CM | POA: Diagnosis not present

## 2014-11-19 DIAGNOSIS — D509 Iron deficiency anemia, unspecified: Secondary | ICD-10-CM | POA: Diagnosis not present

## 2014-11-19 DIAGNOSIS — T814XXD Infection following a procedure, subsequent encounter: Secondary | ICD-10-CM | POA: Diagnosis not present

## 2014-11-19 DIAGNOSIS — A498 Other bacterial infections of unspecified site: Secondary | ICD-10-CM | POA: Diagnosis not present

## 2014-11-19 DIAGNOSIS — Z992 Dependence on renal dialysis: Secondary | ICD-10-CM | POA: Diagnosis not present

## 2014-11-19 DIAGNOSIS — R6883 Chills (without fever): Secondary | ICD-10-CM | POA: Diagnosis not present

## 2014-11-19 DIAGNOSIS — N2581 Secondary hyperparathyroidism of renal origin: Secondary | ICD-10-CM | POA: Diagnosis not present

## 2014-11-19 DIAGNOSIS — F17210 Nicotine dependence, cigarettes, uncomplicated: Secondary | ICD-10-CM | POA: Diagnosis not present

## 2014-11-19 DIAGNOSIS — Z21 Asymptomatic human immunodeficiency virus [HIV] infection status: Secondary | ICD-10-CM | POA: Diagnosis not present

## 2014-11-19 DIAGNOSIS — N186 End stage renal disease: Secondary | ICD-10-CM | POA: Diagnosis not present

## 2014-11-20 DIAGNOSIS — I120 Hypertensive chronic kidney disease with stage 5 chronic kidney disease or end stage renal disease: Secondary | ICD-10-CM | POA: Diagnosis not present

## 2014-11-20 DIAGNOSIS — T814XXD Infection following a procedure, subsequent encounter: Secondary | ICD-10-CM | POA: Diagnosis not present

## 2014-11-20 DIAGNOSIS — N186 End stage renal disease: Secondary | ICD-10-CM | POA: Diagnosis not present

## 2014-11-20 DIAGNOSIS — Z992 Dependence on renal dialysis: Secondary | ICD-10-CM | POA: Diagnosis not present

## 2014-11-20 DIAGNOSIS — Z21 Asymptomatic human immunodeficiency virus [HIV] infection status: Secondary | ICD-10-CM | POA: Diagnosis not present

## 2014-11-20 DIAGNOSIS — F17210 Nicotine dependence, cigarettes, uncomplicated: Secondary | ICD-10-CM | POA: Diagnosis not present

## 2014-11-21 DIAGNOSIS — R6883 Chills (without fever): Secondary | ICD-10-CM | POA: Diagnosis not present

## 2014-11-21 DIAGNOSIS — N186 End stage renal disease: Secondary | ICD-10-CM | POA: Diagnosis not present

## 2014-11-21 DIAGNOSIS — D631 Anemia in chronic kidney disease: Secondary | ICD-10-CM | POA: Diagnosis not present

## 2014-11-21 DIAGNOSIS — F17210 Nicotine dependence, cigarettes, uncomplicated: Secondary | ICD-10-CM | POA: Diagnosis not present

## 2014-11-21 DIAGNOSIS — I120 Hypertensive chronic kidney disease with stage 5 chronic kidney disease or end stage renal disease: Secondary | ICD-10-CM | POA: Diagnosis not present

## 2014-11-21 DIAGNOSIS — T814XXD Infection following a procedure, subsequent encounter: Secondary | ICD-10-CM | POA: Diagnosis not present

## 2014-11-21 DIAGNOSIS — A498 Other bacterial infections of unspecified site: Secondary | ICD-10-CM | POA: Diagnosis not present

## 2014-11-21 DIAGNOSIS — D509 Iron deficiency anemia, unspecified: Secondary | ICD-10-CM | POA: Diagnosis not present

## 2014-11-21 DIAGNOSIS — Z21 Asymptomatic human immunodeficiency virus [HIV] infection status: Secondary | ICD-10-CM | POA: Diagnosis not present

## 2014-11-21 DIAGNOSIS — Z992 Dependence on renal dialysis: Secondary | ICD-10-CM | POA: Diagnosis not present

## 2014-11-21 DIAGNOSIS — N2581 Secondary hyperparathyroidism of renal origin: Secondary | ICD-10-CM | POA: Diagnosis not present

## 2014-11-22 DIAGNOSIS — N186 End stage renal disease: Secondary | ICD-10-CM | POA: Diagnosis not present

## 2014-11-22 DIAGNOSIS — I120 Hypertensive chronic kidney disease with stage 5 chronic kidney disease or end stage renal disease: Secondary | ICD-10-CM | POA: Diagnosis not present

## 2014-11-22 DIAGNOSIS — Z21 Asymptomatic human immunodeficiency virus [HIV] infection status: Secondary | ICD-10-CM | POA: Diagnosis not present

## 2014-11-22 DIAGNOSIS — Z992 Dependence on renal dialysis: Secondary | ICD-10-CM | POA: Diagnosis not present

## 2014-11-22 DIAGNOSIS — T814XXD Infection following a procedure, subsequent encounter: Secondary | ICD-10-CM | POA: Diagnosis not present

## 2014-11-22 DIAGNOSIS — F17210 Nicotine dependence, cigarettes, uncomplicated: Secondary | ICD-10-CM | POA: Diagnosis not present

## 2014-11-23 ENCOUNTER — Telehealth: Payer: Self-pay | Admitting: *Deleted

## 2014-11-23 DIAGNOSIS — Z992 Dependence on renal dialysis: Secondary | ICD-10-CM | POA: Diagnosis not present

## 2014-11-23 DIAGNOSIS — N186 End stage renal disease: Secondary | ICD-10-CM | POA: Diagnosis not present

## 2014-11-23 DIAGNOSIS — F17210 Nicotine dependence, cigarettes, uncomplicated: Secondary | ICD-10-CM | POA: Diagnosis not present

## 2014-11-23 DIAGNOSIS — T814XXD Infection following a procedure, subsequent encounter: Secondary | ICD-10-CM | POA: Diagnosis not present

## 2014-11-23 DIAGNOSIS — I120 Hypertensive chronic kidney disease with stage 5 chronic kidney disease or end stage renal disease: Secondary | ICD-10-CM | POA: Diagnosis not present

## 2014-11-23 DIAGNOSIS — Z21 Asymptomatic human immunodeficiency virus [HIV] infection status: Secondary | ICD-10-CM | POA: Diagnosis not present

## 2014-11-23 NOTE — Telephone Encounter (Signed)
Patient called and advised he can not make appts on Tuesdays and Thursdays as he has dialysis on those days and can not miss. He does however want to be seen asap as he has had surgery on his abdomen to repair a hernia and wants to be seen. Advised the patient will need to see a different doctor because Dr Baxter Flattery works mostly [place] [facility] [place] but will gladly switch him to accomodate his Dialysis days. The patient was happy with this and was given an appt 11/25/14 at 2 pm with Dr Tommy Medal.

## 2014-11-24 DIAGNOSIS — R6883 Chills (without fever): Secondary | ICD-10-CM | POA: Diagnosis not present

## 2014-11-24 DIAGNOSIS — D509 Iron deficiency anemia, unspecified: Secondary | ICD-10-CM | POA: Diagnosis not present

## 2014-11-24 DIAGNOSIS — N186 End stage renal disease: Secondary | ICD-10-CM | POA: Diagnosis not present

## 2014-11-24 DIAGNOSIS — D631 Anemia in chronic kidney disease: Secondary | ICD-10-CM | POA: Diagnosis not present

## 2014-11-24 DIAGNOSIS — A498 Other bacterial infections of unspecified site: Secondary | ICD-10-CM | POA: Diagnosis not present

## 2014-11-24 DIAGNOSIS — N2581 Secondary hyperparathyroidism of renal origin: Secondary | ICD-10-CM | POA: Diagnosis not present

## 2014-11-25 ENCOUNTER — Encounter: Payer: Self-pay | Admitting: Infectious Disease

## 2014-11-25 ENCOUNTER — Ambulatory Visit: Payer: BC Managed Care – PPO | Admitting: Internal Medicine

## 2014-11-25 ENCOUNTER — Ambulatory Visit (INDEPENDENT_AMBULATORY_CARE_PROVIDER_SITE_OTHER): Payer: Medicare Other | Admitting: Infectious Disease

## 2014-11-25 VITALS — BP 152/72 | HR 71 | Temp 98.6°F | Wt 222.0 lb

## 2014-11-25 DIAGNOSIS — E039 Hypothyroidism, unspecified: Secondary | ICD-10-CM

## 2014-11-25 DIAGNOSIS — B182 Chronic viral hepatitis C: Secondary | ICD-10-CM | POA: Insufficient documentation

## 2014-11-25 DIAGNOSIS — B20 Human immunodeficiency virus [HIV] disease: Secondary | ICD-10-CM | POA: Diagnosis not present

## 2014-11-25 DIAGNOSIS — Z992 Dependence on renal dialysis: Secondary | ICD-10-CM | POA: Diagnosis not present

## 2014-11-25 DIAGNOSIS — I120 Hypertensive chronic kidney disease with stage 5 chronic kidney disease or end stage renal disease: Secondary | ICD-10-CM | POA: Diagnosis not present

## 2014-11-25 DIAGNOSIS — T814XXD Infection following a procedure, subsequent encounter: Secondary | ICD-10-CM | POA: Diagnosis not present

## 2014-11-25 DIAGNOSIS — F17210 Nicotine dependence, cigarettes, uncomplicated: Secondary | ICD-10-CM | POA: Diagnosis not present

## 2014-11-25 DIAGNOSIS — N186 End stage renal disease: Secondary | ICD-10-CM | POA: Diagnosis not present

## 2014-11-25 DIAGNOSIS — Z21 Asymptomatic human immunodeficiency virus [HIV] infection status: Secondary | ICD-10-CM | POA: Diagnosis not present

## 2014-11-25 LAB — TSH: TSH: 2.351 u[IU]/mL (ref 0.350–4.500)

## 2014-11-25 NOTE — Progress Notes (Signed)
   Subjective:    Patient ID: Bryan Kluver., male    DOB: August 11, 1951, 64 y.o.   MRN: [id number]  HPI  64 year old [place] American man with HIV and Chronic Hepatitis C without hepatic coma who is sp cure for his HCV with [facility].He also has ESRD and is on HD. His viral load is undetectable and CD4 count is healthy.  Lab Results  Component Value Date   HIV1RNAQUANT <20 11/11/2014   Lab Results  Component Value Date   CD4TABS 460 11/11/2014   CD4TABS 650 07/20/2014   CD4TABS 510 03/25/2014        Review of Systems  Constitutional: Negative for fever, chills, diaphoresis, activity change, appetite change, fatigue and unexpected weight change.  HENT: Negative for congestion, rhinorrhea, sinus pressure, sneezing, sore throat and trouble swallowing.   Eyes: Negative for photophobia and visual disturbance.  Respiratory: Negative for cough, chest tightness, shortness of breath, wheezing and stridor.   Cardiovascular: Negative for chest pain, palpitations and leg swelling.  Gastrointestinal: Negative for nausea, vomiting, abdominal pain, diarrhea, constipation, blood in stool, abdominal distention and anal bleeding.  Genitourinary: Negative for dysuria, hematuria, flank pain and difficulty urinating.  Musculoskeletal: Negative for myalgias, back pain, joint swelling, arthralgias and gait problem.  Skin: Negative for color change, pallor, rash and wound.  Neurological: Negative for dizziness, tremors, weakness and light-headedness.  Hematological: Negative for adenopathy. Does not bruise/bleed easily.  Psychiatric/Behavioral: Negative for behavioral problems, confusion, sleep disturbance, dysphoric mood, decreased concentration and agitation.       Objective:   Physical Exam  Constitutional: He is oriented to person, place, and time. He appears well-developed and well-nourished.  HENT:  Head: Normocephalic and atraumatic.  Eyes: Conjunctivae and EOM are normal.  Neck: Normal range  of motion. Neck supple.  Cardiovascular: Normal rate and regular rhythm.   Pulmonary/Chest: Effort normal. No respiratory distress. He has no wheezes.  Abdominal: Soft. He exhibits no distension.  Musculoskeletal: Normal range of motion. He exhibits no edema or tenderness.  Neurological: He is alert and oriented to person, place, and time.  Skin: Skin is warm and dry. No rash noted. No erythema. No pallor.  Psychiatric: He has a normal mood and affect. His behavior is normal. Judgment and thought content normal.          Assessment & Plan:   HIV: Continue current regimen of boosted Prezista with Norvir, AZT, Epivir and BID Isentress. Would consider changing to Tiv[place]ay from [place]. Hopefully new TAF formulation from ESRD on HD will come out and inform further therapy.  Chronic Hepatitis C without hepatic coma: he has been cured  ESRD on HD   Hypothyroidism: patient is on higher dose of synthroid since December but not had TSH checked yet so I offered to check for him

## 2014-11-26 ENCOUNTER — Other Ambulatory Visit: Payer: Self-pay | Admitting: *Deleted

## 2014-11-26 ENCOUNTER — Ambulatory Visit: Payer: BC Managed Care – PPO | Admitting: Internal Medicine

## 2014-11-26 DIAGNOSIS — D509 Iron deficiency anemia, unspecified: Secondary | ICD-10-CM | POA: Diagnosis not present

## 2014-11-26 DIAGNOSIS — R6883 Chills (without fever): Secondary | ICD-10-CM | POA: Diagnosis not present

## 2014-11-26 DIAGNOSIS — D631 Anemia in chronic kidney disease: Secondary | ICD-10-CM | POA: Diagnosis not present

## 2014-11-26 DIAGNOSIS — A498 Other bacterial infections of unspecified site: Secondary | ICD-10-CM | POA: Diagnosis not present

## 2014-11-26 DIAGNOSIS — N2581 Secondary hyperparathyroidism of renal origin: Secondary | ICD-10-CM | POA: Diagnosis not present

## 2014-11-26 DIAGNOSIS — B20 Human immunodeficiency virus [HIV] disease: Secondary | ICD-10-CM

## 2014-11-26 DIAGNOSIS — N186 End stage renal disease: Secondary | ICD-10-CM | POA: Diagnosis not present

## 2014-11-26 MED ORDER — LAMIVUDINE 100 MG PO TABS: 50.0000 mg | ORAL_TABLET | Freq: Every day | ORAL | Status: DC

## 2014-11-26 MED ORDER — DARUNAVIR ETHANOLATE 800 MG PO TABS: 800.0000 mg | ORAL_TABLET | Freq: Every day | ORAL | Status: DC

## 2014-11-26 MED ORDER — RITONAVIR 100 MG PO TABS: 100.0000 mg | ORAL_TABLET | Freq: Every day | ORAL | Status: DC

## 2014-11-26 MED ORDER — ZIDOVUDINE 300 MG PO TABS
300.0000 mg | ORAL_TABLET | Freq: Every day | ORAL | Status: DC
Start: 2014-11-26 — End: 2015-03-11

## 2014-11-26 MED ORDER — RALTEGRAVIR POTASSIUM 400 MG PO TABS: ORAL_TABLET | ORAL | Status: DC

## 2014-11-26 NOTE — Telephone Encounter (Signed)
ADAP Application 

## 2014-11-27 DIAGNOSIS — Z992 Dependence on renal dialysis: Secondary | ICD-10-CM | POA: Diagnosis not present

## 2014-11-27 DIAGNOSIS — F17210 Nicotine dependence, cigarettes, uncomplicated: Secondary | ICD-10-CM | POA: Diagnosis not present

## 2014-11-27 DIAGNOSIS — I120 Hypertensive chronic kidney disease with stage 5 chronic kidney disease or end stage renal disease: Secondary | ICD-10-CM | POA: Diagnosis not present

## 2014-11-27 DIAGNOSIS — T814XXD Infection following a procedure, subsequent encounter: Secondary | ICD-10-CM | POA: Diagnosis not present

## 2014-11-27 DIAGNOSIS — Z21 Asymptomatic human immunodeficiency virus [HIV] infection status: Secondary | ICD-10-CM | POA: Diagnosis not present

## 2014-11-27 DIAGNOSIS — N186 End stage renal disease: Secondary | ICD-10-CM | POA: Diagnosis not present

## 2014-11-28 DIAGNOSIS — D631 Anemia in chronic kidney disease: Secondary | ICD-10-CM | POA: Diagnosis not present

## 2014-11-28 DIAGNOSIS — A498 Other bacterial infections of unspecified site: Secondary | ICD-10-CM | POA: Diagnosis not present

## 2014-11-28 DIAGNOSIS — D509 Iron deficiency anemia, unspecified: Secondary | ICD-10-CM | POA: Diagnosis not present

## 2014-11-28 DIAGNOSIS — N2581 Secondary hyperparathyroidism of renal origin: Secondary | ICD-10-CM | POA: Diagnosis not present

## 2014-11-28 DIAGNOSIS — R6883 Chills (without fever): Secondary | ICD-10-CM | POA: Diagnosis not present

## 2014-11-28 DIAGNOSIS — N186 End stage renal disease: Secondary | ICD-10-CM | POA: Diagnosis not present

## 2014-11-30 ENCOUNTER — Encounter: Payer: Self-pay | Admitting: Nurse Practitioner

## 2014-11-30 ENCOUNTER — Ambulatory Visit (INDEPENDENT_AMBULATORY_CARE_PROVIDER_SITE_OTHER): Payer: Medicare Other | Admitting: Nurse Practitioner

## 2014-11-30 VITALS — BP 130/78 | HR 71 | Temp 97.6°F | Resp 20 | Ht 71.0 in | Wt 222.2 lb

## 2014-11-30 DIAGNOSIS — K432 Incisional hernia without obstruction or gangrene: Secondary | ICD-10-CM

## 2014-11-30 DIAGNOSIS — B20 Human immunodeficiency virus [HIV] disease: Secondary | ICD-10-CM | POA: Diagnosis not present

## 2014-11-30 DIAGNOSIS — I10 Essential (primary) hypertension: Secondary | ICD-10-CM

## 2014-11-30 DIAGNOSIS — I120 Hypertensive chronic kidney disease with stage 5 chronic kidney disease or end stage renal disease: Secondary | ICD-10-CM | POA: Diagnosis not present

## 2014-11-30 DIAGNOSIS — Z21 Asymptomatic human immunodeficiency virus [HIV] infection status: Secondary | ICD-10-CM | POA: Diagnosis not present

## 2014-11-30 DIAGNOSIS — N186 End stage renal disease: Secondary | ICD-10-CM

## 2014-11-30 DIAGNOSIS — Z992 Dependence on renal dialysis: Secondary | ICD-10-CM | POA: Diagnosis not present

## 2014-11-30 DIAGNOSIS — F17210 Nicotine dependence, cigarettes, uncomplicated: Secondary | ICD-10-CM | POA: Diagnosis not present

## 2014-11-30 DIAGNOSIS — E039 Hypothyroidism, unspecified: Secondary | ICD-10-CM | POA: Diagnosis not present

## 2014-11-30 DIAGNOSIS — T814XXD Infection following a procedure, subsequent encounter: Secondary | ICD-10-CM | POA: Diagnosis not present

## 2014-11-30 NOTE — Patient Instructions (Signed)
Follow up 4 months, sooner if needed

## 2014-11-30 NOTE — Progress Notes (Signed)
Patient ID: Bryan Stief., male   DOB: [birth year], 64 y.o.   MRN: [id number]    PCP: Blanchie Serve, MD  No Known Allergies  Chief Complaint  Patient presents with  . Medical Management of Chronic Issues    3 month follow up, discuss TSH (copy of report printed), and examine hole in stomach     HPI: Patient is a 64 y.o. male seen in the office today for routine follow up. He is s/p large ventral incisional hernia repair with mesh on 10/12/14 by Dr Excell Seltzer.  He c/o "hole" in his stomach-- needing to heal inside out, was healing from outside and Dr Excell Seltzer had to reopen in office- now Nursing coming to his home for wound care. Was coming daily to change dressing now ever other day.  Currently getting antibiotics at dialysis  Continues to follow up with ID for HIV, last OV 11/25/14.  conts on HD for ESRD on Tuesday, Thursday and Saturday   TSH checked by ID and is WNL- labs discussed with pt  Not eating well. Reports he just is too lazy to really go somewhere to get food. But also with decrease appetite.    Review of Systems:  Review of Systems  Constitutional: Negative for activity change, appetite change, fatigue and unexpected weight change.  HENT: Negative for congestion and hearing loss.   Eyes: Negative.   Respiratory: Negative for cough and shortness of breath.   Cardiovascular: Negative for chest pain, palpitations and leg swelling.  Gastrointestinal: Negative for abdominal pain, diarrhea and constipation.  Genitourinary: Negative for difficulty urinating.       ESRD, on dialysis Tuesday, Thursday, Saturday   Musculoskeletal: Negative for myalgias and arthralgias.  Skin: Positive for wound (to abd). Negative for color change.  Neurological: Negative for dizziness, weakness and headaches.  Psychiatric/Behavioral: Negative for behavioral problems, confusion and agitation.    Past Medical History  Diagnosis Date  . HIV (human immunodeficiency virus infection)   .  Sepsis(995.91)   . Gout   . Fournier's gangrene   . Severe protein-calorie malnutrition   . Alcohol abuse   . Dysrhythmia     AFib, not a candidate for coumadin , dr [name] signed off on pt  . Hypertension   . Colostomy status   . Sebaceous cyst   . Cough   . Unspecified constipation   . GERD (gastroesophageal reflux disease)   . Atrial fibrillation   . Ulcer of lower limb, unspecified   . Ulcer of lower limb, unspecified   . Other severe protein-calorie malnutrition   . Disorders of phosphorus metabolism   . Hypopotassemia   . Altered mental status   . Hemoptysis, unspecified   . Acute respiratory failure   . Gangrene   . Sepsis(995.91)   . Peripheral vascular disease, unspecified     denies  . Hypothyroidism   . Myocardial infarction     "told by a dr I had evidence of one that I didn't know about" (10/12/2014)  . Anemia   . Hepatitis C     "I've been treated for all of them" (10/12/2014)  . Hepatitis A   . Hepatitis B   . Gouty arthritis   . Arthritis     "joints" (10/12/2014)  . ESRD (end stage renal disease) on dialysis     "TTS; [facility] " (10/12/2014)  . End stage renal disease   . Anemia in chronic kidney disease(285.21)    Past Surgical History  Procedure Laterality  Date  . Cystoscopy with urethral dilatation  02/17/2012    Procedure: CYSTOSCOPY WITH URETHRAL DILATATION;  Surgeon: Reece Packer, MD;  Location: [facility];  Service: Urology;  Laterality: N/A;  retrograde urethragram, placement of foley catheter exam under anesthesia   . Insertion of dialysis catheter  02/20/2012    Procedure: INSERTION OF DIALYSIS CATHETER;  Surgeon: Angelia Mould, MD;  Location: [facility] OR;  Service: Vascular;  Laterality: N/A;  . Irrigation and debridement abscess  02/29/2012    Procedure: IRRIGATION AND DEBRIDEMENT ABSCESS;  Surgeon: Reece Packer, MD;  Location: [place];  Service: Urology;  Laterality: N/A;  I&D of scrotum abcess  . Insertion of dialysis  catheter  03/29/2012    Procedure: INSERTION OF DIALYSIS CATHETER;  Surgeon: Rosetta Posner, MD;  Location: [facility] OR;  Service: Vascular;  Laterality: N/A;  Insertion tunneled dialysis catheter right IJ  . Av fistula placement  04/12/2012    Procedure: INSERTION OF ARTERIOVENOUS (AV) GORE-TEX GRAFT ARM;  Surgeon: Rosetta Posner, MD;  Location: [facility] OR;  Service: Vascular;  Laterality: Left;  insertion of 6x50 stretch goretex graft   . Thrombectomy and revision of arterioventous (av) goretex  graft  07/15/2012    Procedure: THROMBECTOMY AND REVISION OF ARTERIOVENTOUS (AV) GORETEX  GRAFT;  Surgeon: Elam Dutch, MD;  Location: [place];  Service: Vascular;  Laterality: Left;  . Urethrogram  07/30/2012    Procedure: URETHROGRAM;  Surgeon: Reece Packer, MD;  Location: [facility];  Service: Urology;  Laterality: N/A;  cystogram  . Cystoscopy  07/30/2012    Procedure: CYSTOSCOPY;  Surgeon: Reece Packer, MD;  Location: [facility];  Service: Urology;  Laterality: Bilateral;  retrograde  . Insertion of dialysis catheter  08/04/2012    Procedure: INSERTION OF DIALYSIS CATHETER;  Surgeon: Angelia Mould, MD;  Location: [place];  Service: Vascular;  Laterality: Right;  right intrajugular hemodialysis catheter insertion  . Av fistula placement  08/14/2012    Procedure: INSERTION OF ARTERIOVENOUS (AV) GORE-TEX GRAFT ARM;  Surgeon: Mal Misty, MD;  Location: [place];  Service: Vascular;  Laterality: Left;  . Colostomy  02/2012    d/t an infection  . Av fistula placement Right 11/08/2012    Procedure: ARTERIOVENOUS (AV) FISTULA CREATION;  Surgeon: Angelia Mould, MD;  Location: [place];  Service: Vascular;  Laterality: Right;  . Surgery for fournier's gangrene  02/2012  . Colostomy  13  . Colostomy closure N/A 02/21/2013    Procedure: COLOSTOMY CLOSURE;  Surgeon: [name], MD;  Location: [place];  Service: General;  Laterality: N/A;  takedown of sigmoid colostomy with anastomosis  . Bowel resection  N/A 02/21/2013    Procedure: SMALL BOWEL RESECTION;  Surgeon: [name], MD;  Location: [place];  Service: General;  Laterality: N/A;  . Fistulogram Right 01/06/2013    Procedure: FISTULOGRAM;  Surgeon: Angelia Mould, MD;  Location: [facility] CATH LAB;  Service: Cardiovascular;  Laterality: Right;  . Ventral hernia repair  10/12/2014  . Hernia repair    . Ventral hernia repair N/A 10/12/2014    Procedure: REPAIR VENTRAL INCISIONAL HERNIA;  Surgeon: Excell Seltzer, MD;  Location: [place];  Service: General;  Laterality: N/A;  . Insertion of mesh N/A 10/12/2014    Procedure: INSERTION OF MESH;  Surgeon: Excell Seltzer, MD;  Location: [place];  Service: General;  Laterality: N/A;   Social History:   reports that he has never smoked. He has never used  smokeless tobacco. He reports that he does not drink alcohol or use illicit drugs.  Family History  Problem Relation Age of Onset  . Hypertension Mother   . Hypertension Father   . Cancer Cousin     breast    Medications: Patient's Medications  New Prescriptions   No medications on file  Previous Medications   ALLOPURINOL (ZYLOPRIM) 300 MG TABLET    TAKE ONE TABLET BY MOUTH ONCE DAILY FOR GOUT   ASPIRIN EC 325 MG TABLET    Take 1 tablet (325 mg total) by mouth daily.   CARVEDILOL (COREG) 6.25 MG TABLET    Take 1 tablet (6.25 mg total) by mouth 2 (two) times daily.   DARUNAVIR ETHANOLATE (PREZISTA) 800 MG TABLET    Take 1 tablet (800 mg total) by mouth daily with breakfast.   GABAPENTIN (NEURONTIN) 100 MG CAPSULE    TAKE ONE CAPSULE BY MOUTH THREE TIMES DAILY   LAMIVUDINE (EPIVIR) 100 MG TABLET    Take 0.5 tablets (50 mg total) by mouth daily.   LANTHANUM (FOSRENOL) 1000 MG CHEWABLE TABLET    Chew 1,000 mg by mouth 3 (three) times daily with meals.    LEVOTHYROXINE (SYNTHROID, LEVOTHROID) 50 MCG TABLET    Take 1 tablet (50 mcg total) by mouth daily.   OXYCODONE-ACETAMINOPHEN (PERCOCET/ROXICET) 5-325 MG PER TABLET    Take 1 tablet by  mouth every 6 (six) hours as needed for moderate pain.   RALTEGRAVIR (ISENTRESS) 400 MG TABLET    TAKE ONE TABLET BY MOUTH BY MOUTH TWICE DAILY   RITONAVIR (NORVIR) 100 MG TABS TABLET    Take 1 tablet (100 mg total) by mouth daily.   ZIDOVUDINE (RETROVIR) 300 MG TABLET    Take 1 tablet (300 mg total) by mouth daily.  Modified Medications   No medications on file  Discontinued Medications   No medications on file     Physical Exam:  Filed Vitals:   11/30/14 1312  BP: 130/78  Pulse: 71  Temp: 97.6 F (36.4 C)  TempSrc: Oral  Resp: 20  Height: 5\' 11"  (1.803 m)  Weight: 222 lb 3.5 oz (100.798 kg)  SpO2: 99%    Physical Exam  Constitutional: He is oriented to person, place, and time. He appears well-developed and well-nourished. No distress.  HENT:  Head: Normocephalic and atraumatic.  Mouth/Throat: Oropharynx is clear and moist. No oropharyngeal exudate.  Eyes: Conjunctivae and EOM are normal. Pupils are equal, round, and reactive to light.  Neck: Normal range of motion. Neck supple.  Cardiovascular: Normal rate, regular rhythm and normal heart sounds.   Pulmonary/Chest: Effort normal and breath sounds normal.  Abdominal: Soft. Bowel sounds are normal.  Small opening to abdomen, minimal drainage   Musculoskeletal: He exhibits no edema or tenderness.  Neurological: He is alert and oriented to person, place, and time.  Skin: Skin is warm and dry. He is not diaphoretic.  Psychiatric: He has a normal mood and affect.    Labs reviewed: Basic Metabolic Panel:  Recent Labs  06/24/14 0932  08/31/14 1330  10/14/14 0453 10/15/14 1404 11/11/14 1011 11/25/14 1435  NA 137  < >  --   < > 131* 125* 138  --   K 3.7  < >  --   < > 4.6 3.9 4.2  --   CL 94*  < >  --   < > 90* 86* 98  --   CO2 23  < >  --   < >  27 26 28   --   GLUCOSE 87  < >  --   < > 124* 122* 96  --   BUN 24  < >  --   < > 40* 63* 16  --   CREATININE 6.25*  < >  --   < > 7.69* 9.71* 5.63*  --   CALCIUM 10.1  <  >  --   < > 9.5 9.1 9.3  --   PHOS  --   --   --   --   --  8.3*  --   --   TSH 6.860*  --  6.190*  --   --   --   --  2.351  < > = values in this interval not displayed. Liver Function Tests:  Recent Labs  06/24/14 0932 07/20/14 0932 10/07/14 0833 10/15/14 1404  AST 19 15 17   --   ALT 17 12 14   --   ALKPHOS 85 76 74  --   BILITOT 0.4 0.4 0.8  --   PROT 8.0 7.4 8.6*  --   ALBUMIN  --  3.7 3.9 3.2*   No results for input(s): LIPASE, AMYLASE in the last 8760 hours. No results for input(s): AMMONIA in the last 8760 hours. CBC:  Recent Labs  06/24/14 0932 07/20/14 0932  10/14/14 0453 10/15/14 1403 11/11/14 1011  WBC 6.5 4.9  < > 10.7* 7.8 4.3  NEUTROABS 3.8 2.5  --   --   --  2.4  HGB 11.0* 11.1*  < > 9.9* 8.9* 8.1*  HCT 34.2* 33.7*  < > 30.1* 25.6* 25.3*  MCV 109* 106.3*  < > 104.9* 101.6* 103.7*  PLT  --  217  < > 165 149* 258  < > = values in this interval not displayed. Lipid Panel:  Recent Labs  03/16/14 0829 06/24/14 0932  CHOL 134 167  HDL 44 44  LDLCALC 67 100*  TRIG 116 113  CHOLHDL 3.0 3.8   TSH:  Recent Labs  06/24/14 0932 08/31/14 1330 11/25/14 1435  TSH 6.860* 6.190* 2.351   A1C: No results found for: HGBA1C   Assessment/Plan 1. Essential hypertension -blood pressure stable, cont on current regimen  2. End stage renal disease conts on HD and following with nephrology  3. Human immunodeficiency virus (HIV) disease Recently seen by ID, no changes in medication   4. Ventral incisional hernia -has follow up with Dr Excell Seltzer, surgeon this week.  -conts with dressing changes per surgery and close follow up by surgeon  -encourage proper eating /nutrition for better wound healing  5. Hypothyroidism, unspecified hypothyroidism type -TSH reviewed with pt, to cont current synthroid dose

## 2014-12-01 DIAGNOSIS — N2581 Secondary hyperparathyroidism of renal origin: Secondary | ICD-10-CM | POA: Diagnosis not present

## 2014-12-01 DIAGNOSIS — D509 Iron deficiency anemia, unspecified: Secondary | ICD-10-CM | POA: Diagnosis not present

## 2014-12-01 DIAGNOSIS — A498 Other bacterial infections of unspecified site: Secondary | ICD-10-CM | POA: Diagnosis not present

## 2014-12-01 DIAGNOSIS — N186 End stage renal disease: Secondary | ICD-10-CM | POA: Diagnosis not present

## 2014-12-01 DIAGNOSIS — D631 Anemia in chronic kidney disease: Secondary | ICD-10-CM | POA: Diagnosis not present

## 2014-12-01 DIAGNOSIS — R6883 Chills (without fever): Secondary | ICD-10-CM | POA: Diagnosis not present

## 2014-12-04 DIAGNOSIS — N186 End stage renal disease: Secondary | ICD-10-CM | POA: Diagnosis not present

## 2014-12-04 DIAGNOSIS — Z992 Dependence on renal dialysis: Secondary | ICD-10-CM | POA: Diagnosis not present

## 2014-12-04 DIAGNOSIS — T814XXD Infection following a procedure, subsequent encounter: Secondary | ICD-10-CM | POA: Diagnosis not present

## 2014-12-04 DIAGNOSIS — Z21 Asymptomatic human immunodeficiency virus [HIV] infection status: Secondary | ICD-10-CM | POA: Diagnosis not present

## 2014-12-04 DIAGNOSIS — F17210 Nicotine dependence, cigarettes, uncomplicated: Secondary | ICD-10-CM | POA: Diagnosis not present

## 2014-12-04 DIAGNOSIS — I120 Hypertensive chronic kidney disease with stage 5 chronic kidney disease or end stage renal disease: Secondary | ICD-10-CM | POA: Diagnosis not present

## 2014-12-05 DIAGNOSIS — D631 Anemia in chronic kidney disease: Secondary | ICD-10-CM | POA: Diagnosis not present

## 2014-12-05 DIAGNOSIS — D509 Iron deficiency anemia, unspecified: Secondary | ICD-10-CM | POA: Diagnosis not present

## 2014-12-05 DIAGNOSIS — A498 Other bacterial infections of unspecified site: Secondary | ICD-10-CM | POA: Diagnosis not present

## 2014-12-05 DIAGNOSIS — N186 End stage renal disease: Secondary | ICD-10-CM | POA: Diagnosis not present

## 2014-12-05 DIAGNOSIS — R6883 Chills (without fever): Secondary | ICD-10-CM | POA: Diagnosis not present

## 2014-12-05 DIAGNOSIS — N2581 Secondary hyperparathyroidism of renal origin: Secondary | ICD-10-CM | POA: Diagnosis not present

## 2014-12-07 DIAGNOSIS — Z21 Asymptomatic human immunodeficiency virus [HIV] infection status: Secondary | ICD-10-CM | POA: Diagnosis not present

## 2014-12-07 DIAGNOSIS — F17210 Nicotine dependence, cigarettes, uncomplicated: Secondary | ICD-10-CM | POA: Diagnosis not present

## 2014-12-07 DIAGNOSIS — Z992 Dependence on renal dialysis: Secondary | ICD-10-CM | POA: Diagnosis not present

## 2014-12-07 DIAGNOSIS — I120 Hypertensive chronic kidney disease with stage 5 chronic kidney disease or end stage renal disease: Secondary | ICD-10-CM | POA: Diagnosis not present

## 2014-12-07 DIAGNOSIS — T814XXD Infection following a procedure, subsequent encounter: Secondary | ICD-10-CM | POA: Diagnosis not present

## 2014-12-07 DIAGNOSIS — N186 End stage renal disease: Secondary | ICD-10-CM | POA: Diagnosis not present

## 2014-12-08 DIAGNOSIS — R6883 Chills (without fever): Secondary | ICD-10-CM | POA: Diagnosis not present

## 2014-12-08 DIAGNOSIS — N186 End stage renal disease: Secondary | ICD-10-CM | POA: Diagnosis not present

## 2014-12-08 DIAGNOSIS — N2581 Secondary hyperparathyroidism of renal origin: Secondary | ICD-10-CM | POA: Diagnosis not present

## 2014-12-08 DIAGNOSIS — D509 Iron deficiency anemia, unspecified: Secondary | ICD-10-CM | POA: Diagnosis not present

## 2014-12-08 DIAGNOSIS — A498 Other bacterial infections of unspecified site: Secondary | ICD-10-CM | POA: Diagnosis not present

## 2014-12-08 DIAGNOSIS — D631 Anemia in chronic kidney disease: Secondary | ICD-10-CM | POA: Diagnosis not present

## 2014-12-09 DIAGNOSIS — T814XXD Infection following a procedure, subsequent encounter: Secondary | ICD-10-CM | POA: Diagnosis not present

## 2014-12-09 DIAGNOSIS — N186 End stage renal disease: Secondary | ICD-10-CM | POA: Diagnosis not present

## 2014-12-09 DIAGNOSIS — Z992 Dependence on renal dialysis: Secondary | ICD-10-CM | POA: Diagnosis not present

## 2014-12-09 DIAGNOSIS — Z21 Asymptomatic human immunodeficiency virus [HIV] infection status: Secondary | ICD-10-CM | POA: Diagnosis not present

## 2014-12-09 DIAGNOSIS — F17210 Nicotine dependence, cigarettes, uncomplicated: Secondary | ICD-10-CM | POA: Diagnosis not present

## 2014-12-09 DIAGNOSIS — I120 Hypertensive chronic kidney disease with stage 5 chronic kidney disease or end stage renal disease: Secondary | ICD-10-CM | POA: Diagnosis not present

## 2014-12-10 DIAGNOSIS — N186 End stage renal disease: Secondary | ICD-10-CM | POA: Diagnosis not present

## 2014-12-10 DIAGNOSIS — D631 Anemia in chronic kidney disease: Secondary | ICD-10-CM | POA: Diagnosis not present

## 2014-12-10 DIAGNOSIS — D509 Iron deficiency anemia, unspecified: Secondary | ICD-10-CM | POA: Diagnosis not present

## 2014-12-10 DIAGNOSIS — N2581 Secondary hyperparathyroidism of renal origin: Secondary | ICD-10-CM | POA: Diagnosis not present

## 2014-12-10 DIAGNOSIS — A498 Other bacterial infections of unspecified site: Secondary | ICD-10-CM | POA: Diagnosis not present

## 2014-12-10 DIAGNOSIS — R6883 Chills (without fever): Secondary | ICD-10-CM | POA: Diagnosis not present

## 2014-12-11 DIAGNOSIS — T814XXD Infection following a procedure, subsequent encounter: Secondary | ICD-10-CM | POA: Diagnosis not present

## 2014-12-11 DIAGNOSIS — N186 End stage renal disease: Secondary | ICD-10-CM | POA: Diagnosis not present

## 2014-12-11 DIAGNOSIS — F17210 Nicotine dependence, cigarettes, uncomplicated: Secondary | ICD-10-CM | POA: Diagnosis not present

## 2014-12-11 DIAGNOSIS — Z21 Asymptomatic human immunodeficiency virus [HIV] infection status: Secondary | ICD-10-CM | POA: Diagnosis not present

## 2014-12-11 DIAGNOSIS — I120 Hypertensive chronic kidney disease with stage 5 chronic kidney disease or end stage renal disease: Secondary | ICD-10-CM | POA: Diagnosis not present

## 2014-12-11 DIAGNOSIS — Z992 Dependence on renal dialysis: Secondary | ICD-10-CM | POA: Diagnosis not present

## 2014-12-14 DIAGNOSIS — T814XXD Infection following a procedure, subsequent encounter: Secondary | ICD-10-CM | POA: Diagnosis not present

## 2014-12-14 DIAGNOSIS — F17210 Nicotine dependence, cigarettes, uncomplicated: Secondary | ICD-10-CM | POA: Diagnosis not present

## 2014-12-14 DIAGNOSIS — I120 Hypertensive chronic kidney disease with stage 5 chronic kidney disease or end stage renal disease: Secondary | ICD-10-CM | POA: Diagnosis not present

## 2014-12-14 DIAGNOSIS — Z21 Asymptomatic human immunodeficiency virus [HIV] infection status: Secondary | ICD-10-CM | POA: Diagnosis not present

## 2014-12-14 DIAGNOSIS — N186 End stage renal disease: Secondary | ICD-10-CM | POA: Diagnosis not present

## 2014-12-14 DIAGNOSIS — Z992 Dependence on renal dialysis: Secondary | ICD-10-CM | POA: Diagnosis not present

## 2014-12-15 DIAGNOSIS — N186 End stage renal disease: Secondary | ICD-10-CM | POA: Diagnosis not present

## 2014-12-15 DIAGNOSIS — D509 Iron deficiency anemia, unspecified: Secondary | ICD-10-CM | POA: Diagnosis not present

## 2014-12-15 DIAGNOSIS — D631 Anemia in chronic kidney disease: Secondary | ICD-10-CM | POA: Diagnosis not present

## 2014-12-15 DIAGNOSIS — N2581 Secondary hyperparathyroidism of renal origin: Secondary | ICD-10-CM | POA: Diagnosis not present

## 2014-12-16 DIAGNOSIS — Z21 Asymptomatic human immunodeficiency virus [HIV] infection status: Secondary | ICD-10-CM | POA: Diagnosis not present

## 2014-12-16 DIAGNOSIS — Z992 Dependence on renal dialysis: Secondary | ICD-10-CM | POA: Diagnosis not present

## 2014-12-16 DIAGNOSIS — N186 End stage renal disease: Secondary | ICD-10-CM | POA: Diagnosis not present

## 2014-12-16 DIAGNOSIS — I120 Hypertensive chronic kidney disease with stage 5 chronic kidney disease or end stage renal disease: Secondary | ICD-10-CM | POA: Diagnosis not present

## 2014-12-16 DIAGNOSIS — F17210 Nicotine dependence, cigarettes, uncomplicated: Secondary | ICD-10-CM | POA: Diagnosis not present

## 2014-12-16 DIAGNOSIS — T814XXD Infection following a procedure, subsequent encounter: Secondary | ICD-10-CM | POA: Diagnosis not present

## 2014-12-17 DIAGNOSIS — D631 Anemia in chronic kidney disease: Secondary | ICD-10-CM | POA: Diagnosis not present

## 2014-12-17 DIAGNOSIS — N2581 Secondary hyperparathyroidism of renal origin: Secondary | ICD-10-CM | POA: Diagnosis not present

## 2014-12-17 DIAGNOSIS — N186 End stage renal disease: Secondary | ICD-10-CM | POA: Diagnosis not present

## 2014-12-17 DIAGNOSIS — D509 Iron deficiency anemia, unspecified: Secondary | ICD-10-CM | POA: Diagnosis not present

## 2014-12-18 ENCOUNTER — Other Ambulatory Visit: Payer: Self-pay | Admitting: Gastroenterology

## 2014-12-18 DIAGNOSIS — K7469 Other cirrhosis of liver: Secondary | ICD-10-CM

## 2014-12-18 DIAGNOSIS — N186 End stage renal disease: Secondary | ICD-10-CM | POA: Diagnosis not present

## 2014-12-18 DIAGNOSIS — B1920 Unspecified viral hepatitis C without hepatic coma: Secondary | ICD-10-CM | POA: Diagnosis not present

## 2014-12-18 DIAGNOSIS — K7460 Unspecified cirrhosis of liver: Secondary | ICD-10-CM | POA: Diagnosis not present

## 2014-12-18 DIAGNOSIS — Z992 Dependence on renal dialysis: Secondary | ICD-10-CM | POA: Diagnosis not present

## 2014-12-18 DIAGNOSIS — Z21 Asymptomatic human immunodeficiency virus [HIV] infection status: Secondary | ICD-10-CM | POA: Diagnosis not present

## 2014-12-18 DIAGNOSIS — T814XXD Infection following a procedure, subsequent encounter: Secondary | ICD-10-CM | POA: Diagnosis not present

## 2014-12-18 DIAGNOSIS — F17210 Nicotine dependence, cigarettes, uncomplicated: Secondary | ICD-10-CM | POA: Diagnosis not present

## 2014-12-18 DIAGNOSIS — I120 Hypertensive chronic kidney disease with stage 5 chronic kidney disease or end stage renal disease: Secondary | ICD-10-CM | POA: Diagnosis not present

## 2014-12-19 DIAGNOSIS — N2581 Secondary hyperparathyroidism of renal origin: Secondary | ICD-10-CM | POA: Diagnosis not present

## 2014-12-19 DIAGNOSIS — N186 End stage renal disease: Secondary | ICD-10-CM | POA: Diagnosis not present

## 2014-12-19 DIAGNOSIS — D631 Anemia in chronic kidney disease: Secondary | ICD-10-CM | POA: Diagnosis not present

## 2014-12-19 DIAGNOSIS — D509 Iron deficiency anemia, unspecified: Secondary | ICD-10-CM | POA: Diagnosis not present

## 2014-12-21 ENCOUNTER — Emergency Department (HOSPITAL_COMMUNITY): Payer: Medicare Other

## 2014-12-21 ENCOUNTER — Encounter (HOSPITAL_COMMUNITY): Payer: Self-pay

## 2014-12-21 ENCOUNTER — Inpatient Hospital Stay (HOSPITAL_COMMUNITY)
Admission: EM | Admit: 2014-12-21 | Discharge: 2014-12-24 | DRG: 682 | Disposition: A | Discharge status: Home / Self Care | Payer: Medicare Other | Attending: Internal Medicine | Admitting: Internal Medicine

## 2014-12-21 DIAGNOSIS — I482 Chronic atrial fibrillation, unspecified: Secondary | ICD-10-CM

## 2014-12-21 DIAGNOSIS — B20 Human immunodeficiency virus [HIV] disease: Secondary | ICD-10-CM | POA: Diagnosis present

## 2014-12-21 DIAGNOSIS — I10 Essential (primary) hypertension: Secondary | ICD-10-CM

## 2014-12-21 DIAGNOSIS — M1990 Unspecified osteoarthritis, unspecified site: Secondary | ICD-10-CM | POA: Diagnosis present

## 2014-12-21 DIAGNOSIS — K219 Gastro-esophageal reflux disease without esophagitis: Secondary | ICD-10-CM | POA: Diagnosis present

## 2014-12-21 DIAGNOSIS — I120 Hypertensive chronic kidney disease with stage 5 chronic kidney disease or end stage renal disease: Principal | ICD-10-CM | POA: Diagnosis present

## 2014-12-21 DIAGNOSIS — N184 Chronic kidney disease, stage 4 (severe): Secondary | ICD-10-CM

## 2014-12-21 DIAGNOSIS — D631 Anemia in chronic kidney disease: Secondary | ICD-10-CM | POA: Diagnosis present

## 2014-12-21 DIAGNOSIS — R51 Headache: Secondary | ICD-10-CM | POA: Diagnosis not present

## 2014-12-21 DIAGNOSIS — Z992 Dependence on renal dialysis: Secondary | ICD-10-CM | POA: Diagnosis not present

## 2014-12-21 DIAGNOSIS — I252 Old myocardial infarction: Secondary | ICD-10-CM

## 2014-12-21 DIAGNOSIS — E039 Hypothyroidism, unspecified: Secondary | ICD-10-CM

## 2014-12-21 DIAGNOSIS — Z8249 Family history of ischemic heart disease and other diseases of the circulatory system: Secondary | ICD-10-CM

## 2014-12-21 DIAGNOSIS — N186 End stage renal disease: Secondary | ICD-10-CM | POA: Diagnosis not present

## 2014-12-21 DIAGNOSIS — R7989 Other specified abnormal findings of blood chemistry: Secondary | ICD-10-CM

## 2014-12-21 DIAGNOSIS — N189 Chronic kidney disease, unspecified: Secondary | ICD-10-CM

## 2014-12-21 DIAGNOSIS — R0602 Shortness of breath: Secondary | ICD-10-CM | POA: Diagnosis not present

## 2014-12-21 DIAGNOSIS — N2581 Secondary hyperparathyroidism of renal origin: Secondary | ICD-10-CM | POA: Diagnosis not present

## 2014-12-21 DIAGNOSIS — B182 Chronic viral hepatitis C: Secondary | ICD-10-CM | POA: Diagnosis present

## 2014-12-21 DIAGNOSIS — Z7982 Long term (current) use of aspirin: Secondary | ICD-10-CM

## 2014-12-21 DIAGNOSIS — F17210 Nicotine dependence, cigarettes, uncomplicated: Secondary | ICD-10-CM | POA: Diagnosis not present

## 2014-12-21 DIAGNOSIS — I4821 Permanent atrial fibrillation: Secondary | ICD-10-CM | POA: Diagnosis present

## 2014-12-21 DIAGNOSIS — I160 Hypertensive urgency: Secondary | ICD-10-CM

## 2014-12-21 DIAGNOSIS — R778 Other specified abnormalities of plasma proteins: Secondary | ICD-10-CM

## 2014-12-21 DIAGNOSIS — D509 Iron deficiency anemia, unspecified: Secondary | ICD-10-CM | POA: Diagnosis not present

## 2014-12-21 DIAGNOSIS — T814XXD Infection following a procedure, subsequent encounter: Secondary | ICD-10-CM | POA: Diagnosis not present

## 2014-12-21 DIAGNOSIS — R079 Chest pain, unspecified: Secondary | ICD-10-CM | POA: Diagnosis not present

## 2014-12-21 DIAGNOSIS — Z21 Asymptomatic human immunodeficiency virus [HIV] infection status: Secondary | ICD-10-CM | POA: Diagnosis not present

## 2014-12-21 LAB — COMPREHENSIVE METABOLIC PANEL
ALT: 8 U/L (ref 0–53)
AST: 13 U/L (ref 0–37)
Albumin: 3.4 g/dL — ABNORMAL LOW (ref 3.5–5.2)
Alkaline Phosphatase: 60 U/L (ref 39–117)
Anion gap: 9 (ref 5–15)
BUN: 20 mg/dL (ref 6–23)
CO2: 27 mmol/L (ref 19–32)
CREATININE: 7.61 mg/dL — AB (ref 0.50–1.35)
Calcium: 9.2 mg/dL (ref 8.4–10.5)
Chloride: 100 mmol/L (ref 96–112)
GFR calc Af Amer: 8 mL/min — ABNORMAL LOW (ref >90)
GFR calc non Af Amer: 7 mL/min — ABNORMAL LOW (ref >90)
Glucose, Bld: 93 mg/dL (ref 70–99)
Potassium: 4.2 mmol/L (ref 3.5–5.1)
Sodium: 136 mmol/L (ref 135–145)
Total Bilirubin: 0.7 mg/dL (ref 0.3–1.2)
Total Protein: 6.9 g/dL (ref 6.0–8.3)

## 2014-12-21 LAB — URINALYSIS, ROUTINE W REFLEX MICROSCOPIC
Bilirubin Urine: NEGATIVE
GLUCOSE, UA: NEGATIVE mg/dL
Ketones, ur: NEGATIVE mg/dL
NITRITE: NEGATIVE
Specific Gravity, Urine: 1.014 (ref 1.005–1.030)
Urobilinogen, UA: 0.2 mg/dL (ref 0.0–1.0)
pH: 8 (ref 5.0–8.0)

## 2014-12-21 LAB — CBC WITH DIFFERENTIAL/PLATELET
Basophils Absolute: 0 10*3/uL (ref 0.0–0.1)
Basophils Relative: 0 % (ref 0–1)
EOS PCT: 2 % (ref 0–5)
Eosinophils Absolute: 0.1 10*3/uL (ref 0.0–0.7)
HEMATOCRIT: 32.5 % — AB (ref 39.0–52.0)
Hemoglobin: 10 g/dL — ABNORMAL LOW (ref 13.0–17.0)
LYMPHS ABS: 1.6 10*3/uL (ref 0.7–4.0)
LYMPHS PCT: 34 % (ref 12–46)
MCH: 32.4 pg (ref 26.0–34.0)
MCHC: 30.8 g/dL (ref 30.0–36.0)
MCV: 105.2 fL — AB (ref 78.0–100.0)
MONO ABS: 0.3 10*3/uL (ref 0.1–1.0)
Monocytes Relative: 6 % (ref 3–12)
Neutro Abs: 2.6 10*3/uL (ref 1.7–7.7)
Neutrophils Relative %: 58 % (ref 43–77)
Platelets: 152 10*3/uL (ref 150–400)
RBC: 3.09 MIL/uL — AB (ref 4.22–5.81)
RDW: 15.4 % (ref 11.5–15.5)
WBC: 4.6 10*3/uL (ref 4.0–10.5)

## 2014-12-21 LAB — URINE MICROSCOPIC-ADD ON

## 2014-12-21 LAB — TROPONIN I: Troponin I: 0.04 ng/mL — ABNORMAL HIGH (ref <0.031)

## 2014-12-21 MED ORDER — NITROGLYCERIN IN D5W 200-5 MCG/ML-% IV SOLN
5.0000 ug/min | Freq: Once | INTRAVENOUS | Status: AC
  Administered 2014-12-21: 5 ug/min via INTRAVENOUS
  Filled 2014-12-21: qty 250

## 2014-12-21 NOTE — ED Notes (Signed)
Attempted IV x2. Unable to access.  

## 2014-12-21 NOTE — H&P (Signed)
[facility] History and Physical  Bryan Gilmore. UMP:[id number] DOB: Feb 04, 1951 DOA: 12/21/2014  Referring physician: Delon Sacramento, PA PCP: Blanchie Serve, MD   Chief Complaint: Hypertensive Urgency  HPI: Bryan Gilmore. is a 64 y.o. male presents with a hypertensive urgency. Patient states that he has a histroy of hypertension but has not been consistently on treatment for it. States he only takes Counselling psychologist. He states that over the last 2-3 weeks his pressures have been going up, He states that he also recently has had surgery for a hernia. He also has ESRD on dialysis and states he has not missed any days. He goes on Tuesday Thursday and Saturday. Patient states he has been having some chest pain but has not been too worried about it,. He states he took some alkaseltzer and tums for it. In the ED he had a positive troponin. In addition he is HIV positive diagnosed 3 years. He states he has been taking all his medications and states his viral load has been undetectable. Patient was started on NTG in the ED and his pressure seems to be coming down. He has no headaches and states he has no visual problems. He denies having any focal weakness.   Review of Systems:  Constitutional:  No weight loss, night sweats, Fevers, chills, fatigue.  HEENT:  No headaches, No sneezing, itching, ear ache, nasal congestion, post nasal drip,  Cardio-vascular:  No chest pain, Orthopnea, PND, swelling in lower extremities, anasarca, dizziness, palpitations  GI:  No heartburn, indigestion, abdominal pain, nausea, vomiting, diarrhea, change in bowel habits, loss of appetite  Resp:  No shortness of breath with exertion or at rest. No excess mucus, no productive cough, No non-productive cough, No coughing up of blood Skin:  no rash or lesions.  GU:  no dysuria, change in color of urine, no urgency or frequency Musculoskeletal:  No joint pain or swelling. No decreased range of motion Psych:  No change  in mood or affect. No depression or anxiety. No memory loss.   Past Medical History  Diagnosis Date  . HIV (human immunodeficiency virus infection)   . Sepsis(995.91)   . Gout   . Fournier's gangrene   . Severe protein-calorie malnutrition   . Alcohol abuse   . Dysrhythmia     AFib, not a candidate for coumadin , dr [name] signed off on pt  . Hypertension   . Colostomy status   . Sebaceous cyst   . Cough   . Unspecified constipation   . GERD (gastroesophageal reflux disease)   . Atrial fibrillation   . Ulcer of lower limb, unspecified   . Ulcer of lower limb, unspecified   . Other severe protein-calorie malnutrition   . Disorders of phosphorus metabolism   . Hypopotassemia   . Altered mental status   . Hemoptysis, unspecified   . Acute respiratory failure   . Gangrene   . Sepsis(995.91)   . Peripheral vascular disease, unspecified     denies  . Hypothyroidism   . Myocardial infarction     "told by a dr I had evidence of one that I didn't know about" (10/12/2014)  . Anemia   . Hepatitis C     "I've been treated for all of them" (10/12/2014)  . Hepatitis A   . Hepatitis B   . Gouty arthritis   . Arthritis     "joints" (10/12/2014)  . ESRD (end stage renal disease) on dialysis     "TTS; [facility] " (  10/12/2014)  . End stage renal disease   . Anemia in chronic kidney disease(285.21)    Past Surgical History  Procedure Laterality Date  . Cystoscopy with urethral dilatation  02/17/2012    Procedure: CYSTOSCOPY WITH URETHRAL DILATATION;  Surgeon: Reece Packer, MD;  Location: [facility];  Service: Urology;  Laterality: N/A;  retrograde urethragram, placement of foley catheter exam under anesthesia   . Insertion of dialysis catheter  02/20/2012    Procedure: INSERTION OF DIALYSIS CATHETER;  Surgeon: Angelia Mould, MD;  Location: [facility] OR;  Service: Vascular;  Laterality: N/A;  . Irrigation and debridement abscess  02/29/2012    Procedure: IRRIGATION AND  DEBRIDEMENT ABSCESS;  Surgeon: Reece Packer, MD;  Location: [place];  Service: Urology;  Laterality: N/A;  I&D of scrotum abcess  . Insertion of dialysis catheter  03/29/2012    Procedure: INSERTION OF DIALYSIS CATHETER;  Surgeon: Rosetta Posner, MD;  Location: [facility] OR;  Service: Vascular;  Laterality: N/A;  Insertion tunneled dialysis catheter right IJ  . Av fistula placement  04/12/2012    Procedure: INSERTION OF ARTERIOVENOUS (AV) GORE-TEX GRAFT ARM;  Surgeon: Rosetta Posner, MD;  Location: [facility] OR;  Service: Vascular;  Laterality: Left;  insertion of 6x50 stretch goretex graft   . Thrombectomy and revision of arterioventous (av) goretex  graft  07/15/2012    Procedure: THROMBECTOMY AND REVISION OF ARTERIOVENTOUS (AV) GORETEX  GRAFT;  Surgeon: Elam Dutch, MD;  Location: [place];  Service: Vascular;  Laterality: Left;  . Urethrogram  07/30/2012    Procedure: URETHROGRAM;  Surgeon: Reece Packer, MD;  Location: [facility];  Service: Urology;  Laterality: N/A;  cystogram  . Cystoscopy  07/30/2012    Procedure: CYSTOSCOPY;  Surgeon: Reece Packer, MD;  Location: [facility];  Service: Urology;  Laterality: Bilateral;  retrograde  . Insertion of dialysis catheter  08/04/2012    Procedure: INSERTION OF DIALYSIS CATHETER;  Surgeon: Angelia Mould, MD;  Location: [place];  Service: Vascular;  Laterality: Right;  right intrajugular hemodialysis catheter insertion  . Av fistula placement  08/14/2012    Procedure: INSERTION OF ARTERIOVENOUS (AV) GORE-TEX GRAFT ARM;  Surgeon: Mal Misty, MD;  Location: [place];  Service: Vascular;  Laterality: Left;  . Colostomy  02/2012    d/t an infection  . Av fistula placement Right 11/08/2012    Procedure: ARTERIOVENOUS (AV) FISTULA CREATION;  Surgeon: Angelia Mould, MD;  Location: [place];  Service: Vascular;  Laterality: Right;  . Surgery for fournier's gangrene  02/2012  . Colostomy  13  . Colostomy closure N/A 02/21/2013    Procedure: COLOSTOMY CLOSURE;   Surgeon: [name], MD;  Location: [place];  Service: General;  Laterality: N/A;  takedown of sigmoid colostomy with anastomosis  . Bowel resection N/A 02/21/2013    Procedure: SMALL BOWEL RESECTION;  Surgeon: [name], MD;  Location: [place];  Service: General;  Laterality: N/A;  . Fistulogram Right 01/06/2013    Procedure: FISTULOGRAM;  Surgeon: Angelia Mould, MD;  Location: [facility] CATH LAB;  Service: Cardiovascular;  Laterality: Right;  . Ventral hernia repair  10/12/2014  . Hernia repair    . Ventral hernia repair N/A 10/12/2014    Procedure: REPAIR VENTRAL INCISIONAL HERNIA;  Surgeon: Excell Seltzer, MD;  Location: [place];  Service: General;  Laterality: N/A;  . Insertion of mesh N/A 10/12/2014    Procedure: INSERTION OF MESH;  Surgeon: Excell Seltzer, MD;  Location:  [place] OR;  Service: General;  Laterality: N/A;   Social History:  reports that he has never smoked. He has never used smokeless tobacco. He reports that he does not drink alcohol or use illicit drugs.  No Known Allergies  Family History  Problem Relation Age of Onset  . Hypertension Mother   . Hypertension Father   . Cancer Cousin     breast     Prior to Admission medications   Medication Sig Start Date End Date Taking? Authorizing Provider  allopurinol (ZYLOPRIM) 300 MG tablet TAKE ONE TABLET BY MOUTH ONCE DAILY FOR GOUT 08/08/13  Yes Lauree Chandler, NP  carvedilol (COREG) 6.25 MG tablet Take 1 tablet (6.25 mg total) by mouth 2 (two) times daily. 11/04/14  Yes Gildardo Cranker, DO  Darunavir Ethanolate (PREZISTA) 800 MG tablet Take 1 tablet (800 mg total) by mouth daily with breakfast. 11/26/14  Yes Thayer Headings, MD  gabapentin (NEURONTIN) 100 MG capsule TAKE ONE CAPSULE BY MOUTH THREE TIMES DAILY 09/14/14  Yes Lauree Chandler, NP  lamivudine (EPIVIR) 100 MG tablet Take 0.5 tablets (50 mg total) by mouth daily. 11/26/14  Yes Thayer Headings, MD  lanthanum (FOSRENOL) 1000 MG chewable tablet Chew 1,000 mg  by mouth 3 (three) times daily with meals.    Yes Historical Provider, MD  oxyCODONE-acetaminophen (PERCOCET/ROXICET) 5-325 MG per tablet Take 1 tablet by mouth every 6 (six) hours as needed for moderate pain. 10/16/14  Yes Excell Seltzer, MD  raltegravir (ISENTRESS) 400 MG tablet TAKE ONE TABLET BY MOUTH BY MOUTH TWICE DAILY 11/26/14  Yes Thayer Headings, MD  ritonavir (NORVIR) 100 MG TABS tablet Take 1 tablet (100 mg total) by mouth daily. 11/26/14  Yes Thayer Headings, MD  zidovudine (RETROVIR) 300 MG tablet Take 1 tablet (300 mg total) by mouth daily. 11/26/14  Yes Thayer Headings, MD  aspirin EC 325 MG tablet Take 1 tablet (325 mg total) by mouth daily. 07/01/14   Josue Hector, MD  levothyroxine (SYNTHROID, LEVOTHROID) 50 MCG tablet Take 1 tablet (50 mcg total) by mouth daily. Patient taking differently: Take 50 mcg by mouth daily before breakfast.  09/02/14   Blanchie Serve, MD   Physical Exam: Filed Vitals:   12/21/14 2115 12/21/14 2130 12/21/14 2145 12/21/14 2215  BP: 182/96 172/89 164/91 180/86  Pulse: 65 60 67 49  Temp:      TempSrc:      Resp: 21 19 18 18   SpO2: 100% 100% 98% 99%    Wt Readings from Last 3 Encounters:  11/30/14 100.798 kg (222 lb 3.5 oz)  11/25/14 100.699 kg (222 lb)  11/04/14 101.152 kg (223 lb)    General:  Appears calm and comfortable Eyes: PERRL, normal lids, irises & conjunctiva ENT: grossly normal hearing, lips & tongue Neck: no LAD, masses or thyromegaly Cardiovascular: IRR, no m/r/g. No LE edema. Respiratory: CTA bilaterally, no w/r/r. Normal respiratory effort. Abdomen: soft, ntnd Skin: no rash or induration seen on limited exam Musculoskeletal: grossly normal tone BUE/BLE Psychiatric: grossly normal mood and affect, speech fluent and appropriate Neurologic: grossly non-focal.          Labs on Admission:  Basic Metabolic Panel:  Recent Labs Lab 12/21/14 2021  NA 136  K 4.2  CL 100  CO2 27  GLUCOSE 93  BUN 20  CREATININE 7.61*    CALCIUM 9.2   Liver Function Tests:  Recent Labs Lab 12/21/14 2021  AST 13  ALT 8  ALKPHOS  60  BILITOT 0.7  PROT 6.9  ALBUMIN 3.4*   No results for input(s): LIPASE, AMYLASE in the last 168 hours. No results for input(s): AMMONIA in the last 168 hours. CBC:  Recent Labs Lab 12/21/14 2021  WBC 4.6  NEUTROABS 2.6  HGB 10.0*  HCT 32.5*  MCV 105.2*  PLT 152   Cardiac Enzymes:  Recent Labs Lab 12/21/14 2021  TROPONINI 0.04*    BNP (last 3 results) No results for input(s): BNP in the last 8760 hours.  ProBNP (last 3 results) No results for input(s): PROBNP in the last 8760 hours.  CBG: No results for input(s): GLUCAP in the last 168 hours.  Radiological Exams on Admission: Ct Head Wo Contrast  12/21/2014   CLINICAL DATA:  Headache.  Hypertension  EXAM: CT HEAD WITHOUT CONTRAST  TECHNIQUE: Contiguous axial images were obtained from the base of the skull through the vertex without intravenous contrast.  COMPARISON:  CT head 04/13/2014  FINDINGS: Mild to moderate atrophy.  Negative for hydrocephalus  Chronic microvascular ischemic changes in the white matter similar to the prior study  Negative for acute infarct.  Negative for hemorrhage or mass lesion.  Atherosclerotic calcification cavernous carotid bilaterally.  Calvarium intact.  IMPRESSION: Atrophy and chronic microvascular ischemia.  No acute abnormality.   Electronically Signed   By: Franchot Gallo M.D.   On: 12/21/2014 21:20   Dg Chest Port 1 View  12/21/2014   CLINICAL DATA:  Intermittent shortness of breath for 3-4 days.  EXAM: PORTABLE CHEST - 1 VIEW  COMPARISON:  CT of the chest 07/05/2013  FINDINGS: The heart is mildly enlarged. There are no focal consolidations or pleural effusions. There is mild pulmonary vascular congestion.  IMPRESSION: Cardiomegaly and mild vascular congestion.   Electronically Signed   By: Nolon Nations M.D.   On: 12/21/2014 22:09      Assessment/Plan Principal Problem:    Hypertensive urgency Active Problems:   Human immunodeficiency virus (HIV) disease   ESRD (end stage renal disease)   Anemia of chronic renal failure   Atrial fibrillation, permanent   Hypothyroid   1. Hypertensive Urgency -will be admitted to step down -started on IV NTG in ED will titrate as needed -will continue with carvidelol suspect will need additional medications -will also get an echo in am  2. HIV -will continue with antiretrovirals  3. ESRD -due for dialysis in am  4. Atrial Fibrillation -currently rate controlled  5. Hypothyroid -check TSH  6. Elevated Troponin -will cycle enzymes   Code Status: Full Code (must indicate code status--if unknown or must be presumed, indicate so) DVT Prophylaxis:[place] Family Communication: None (indicate person spoken with, if applicable, with phone number if by telephone) Disposition Plan: Home (indicate anticipated LOS)  Time spent: 97min  Juanluis Guastella A [facility] Pager [phone]

## 2014-12-21 NOTE — ED Notes (Signed)
Pt undressed, in gown, on monitor, continuous pulse oximetry and blood pressure cuff 

## 2014-12-21 NOTE — ED Notes (Signed)
Xray at the bedside.

## 2014-12-21 NOTE — ED Notes (Signed)
Per EMS, Patient had hernia removed in abdomen about a year ago. Patient is coming from home. When patient was assessed today, patient was found to be HTN. 230/110 Initial, Last 198/104. Patient has no symptoms with lying down, but patient complains of 4/10 Headache when walking. Patient denies any Chest Pain. Patient reports intermittent SOB with the past three-four days. Patient has history of Afib and is currently in controlled Afib 80s-90s. Stroke Negative.

## 2014-12-21 NOTE — ED Notes (Signed)
PA questioned about patient having food. PA asked to speak with MD about case and then she would let me know.

## 2014-12-21 NOTE — ED Notes (Signed)
Admitting MD at the bedside.  

## 2014-12-21 NOTE — ED Provider Notes (Signed)
CSN: [id number]     Arrival date & time 12/21/14  1840 History   First MD Initiated Contact with Patient 12/21/14 1909     Chief Complaint  Patient presents with  . Hypertension     (Consider location/radiation/quality/duration/timing/severity/associated sxs/prior Treatment) Patient is a 64 y.o. male presenting with hypertension. The history is provided by the patient and medical records. No language interpreter was used.  Hypertension Associated symptoms include headaches. Pertinent negatives include no abdominal pain, chest pain, coughing, diaphoresis, fatigue, fever, nausea, rash or vomiting.     Bryan Gilmore. is a 64 y.o. male  with a hx of HIV, gout, ESRD on dialysis (T, TH, Sat), a-fib, EtOH abuse, MI, Hepatitis A, B and C, arthritis presents to the Emergency Department complaining of gradual, persistent, progressively worsening high blood pressure onset 1 week ago. Pt reports he had a hernia repair on Feb 1st and has had a home health nurse checking on him.  He reports his BP was high today with a SBP of 240.  He reports over the last few weeks his diastolic BP has been high, but the systolic had been fine.  He reports he has been taking his medications as directed and he has not had any of them changed.  Pt has f/u with surgery on Wednesday.  Pt reports attending dialysis on Sat without complication.  Pt is to go tomorrow.  Pt reports intermittent SOB in the last 2 week, but denies SOB at this time. NO aggravating or alleviating factors.  Pt denies fever, chest pain, SOB, abd pain, N/V/D, weakness, dizziness, syncope, dysuria.       ID: [name] - pt reports undetectable for > 2 years PCP: [facility]  Past Medical History  Diagnosis Date  . HIV (human immunodeficiency virus infection)   . Sepsis(995.91)   . Gout   . Fournier's gangrene   . Severe protein-calorie malnutrition   . Alcohol abuse   . Dysrhythmia     AFib, not a candidate for coumadin , dr [name]  signed off on pt  . Hypertension   . Colostomy status   . Sebaceous cyst   . Cough   . Unspecified constipation   . GERD (gastroesophageal reflux disease)   . Atrial fibrillation   . Ulcer of lower limb, unspecified   . Ulcer of lower limb, unspecified   . Other severe protein-calorie malnutrition   . Disorders of phosphorus metabolism   . Hypopotassemia   . Altered mental status   . Hemoptysis, unspecified   . Acute respiratory failure   . Gangrene   . Sepsis(995.91)   . Peripheral vascular disease, unspecified     denies  . Hypothyroidism   . Myocardial infarction     "told by a dr I had evidence of one that I didn't know about" (10/12/2014)  . Anemia   . Hepatitis C     "I've been treated for all of them" (10/12/2014)  . Hepatitis A   . Hepatitis B   . Gouty arthritis   . Arthritis     "joints" (10/12/2014)  . ESRD (end stage renal disease) on dialysis     "TTS; [facility] " (10/12/2014)  . End stage renal disease   . Anemia in chronic kidney disease(285.21)    Past Surgical History  Procedure Laterality Date  . Cystoscopy with urethral dilatation  02/17/2012    Procedure: CYSTOSCOPY WITH URETHRAL DILATATION;  Surgeon: Reece Packer, MD;  Location:  [facility];  Service: Urology;  Laterality: N/A;  retrograde urethragram, placement of foley catheter exam under anesthesia   . Insertion of dialysis catheter  02/20/2012    Procedure: INSERTION OF DIALYSIS CATHETER;  Surgeon: Angelia Mould, MD;  Location: [facility] OR;  Service: Vascular;  Laterality: N/A;  . Irrigation and debridement abscess  02/29/2012    Procedure: IRRIGATION AND DEBRIDEMENT ABSCESS;  Surgeon: Reece Packer, MD;  Location: [place];  Service: Urology;  Laterality: N/A;  I&D of scrotum abcess  . Insertion of dialysis catheter  03/29/2012    Procedure: INSERTION OF DIALYSIS CATHETER;  Surgeon: Rosetta Posner, MD;  Location: [facility] OR;  Service: Vascular;  Laterality: N/A;  Insertion tunneled dialysis  catheter right IJ  . Av fistula placement  04/12/2012    Procedure: INSERTION OF ARTERIOVENOUS (AV) GORE-TEX GRAFT ARM;  Surgeon: Rosetta Posner, MD;  Location: [facility] OR;  Service: Vascular;  Laterality: Left;  insertion of 6x50 stretch goretex graft   . Thrombectomy and revision of arterioventous (av) goretex  graft  07/15/2012    Procedure: THROMBECTOMY AND REVISION OF ARTERIOVENTOUS (AV) GORETEX  GRAFT;  Surgeon: Elam Dutch, MD;  Location: [place];  Service: Vascular;  Laterality: Left;  . Urethrogram  07/30/2012    Procedure: URETHROGRAM;  Surgeon: Reece Packer, MD;  Location: [facility];  Service: Urology;  Laterality: N/A;  cystogram  . Cystoscopy  07/30/2012    Procedure: CYSTOSCOPY;  Surgeon: Reece Packer, MD;  Location: [facility];  Service: Urology;  Laterality: Bilateral;  retrograde  . Insertion of dialysis catheter  08/04/2012    Procedure: INSERTION OF DIALYSIS CATHETER;  Surgeon: Angelia Mould, MD;  Location: [place];  Service: Vascular;  Laterality: Right;  right intrajugular hemodialysis catheter insertion  . Av fistula placement  08/14/2012    Procedure: INSERTION OF ARTERIOVENOUS (AV) GORE-TEX GRAFT ARM;  Surgeon: Mal Misty, MD;  Location: [place];  Service: Vascular;  Laterality: Left;  . Colostomy  02/2012    d/t an infection  . Av fistula placement Right 11/08/2012    Procedure: ARTERIOVENOUS (AV) FISTULA CREATION;  Surgeon: Angelia Mould, MD;  Location: [place];  Service: Vascular;  Laterality: Right;  . Surgery for fournier's gangrene  02/2012  . Colostomy  13  . Colostomy closure N/A 02/21/2013    Procedure: COLOSTOMY CLOSURE;  Surgeon: [name], MD;  Location: [place];  Service: General;  Laterality: N/A;  takedown of sigmoid colostomy with anastomosis  . Bowel resection N/A 02/21/2013    Procedure: SMALL BOWEL RESECTION;  Surgeon: [name], MD;  Location: [place];  Service: General;  Laterality: N/A;  . Fistulogram Right 01/06/2013     Procedure: FISTULOGRAM;  Surgeon: Angelia Mould, MD;  Location: [facility] CATH LAB;  Service: Cardiovascular;  Laterality: Right;  . Ventral hernia repair  10/12/2014  . Hernia repair    . Ventral hernia repair N/A 10/12/2014    Procedure: REPAIR VENTRAL INCISIONAL HERNIA;  Surgeon: Excell Seltzer, MD;  Location: [place];  Service: General;  Laterality: N/A;  . Insertion of mesh N/A 10/12/2014    Procedure: INSERTION OF MESH;  Surgeon: Excell Seltzer, MD;  Location: [facility] OR;  Service: General;  Laterality: N/A;   Family History  Problem Relation Age of Onset  . Hypertension Mother   . Hypertension Father   . Cancer Cousin     breast   History  Substance Use Topics  . Smoking status: Never  Smoker   . Smokeless tobacco: Never Used  . Alcohol Use: No    Review of Systems  Constitutional: Negative for fever, diaphoresis, appetite change, fatigue and unexpected weight change.  HENT: Negative for mouth sores.   Eyes: Negative for visual disturbance.  Respiratory: Negative for cough, chest tightness, shortness of breath and wheezing.   Cardiovascular: Negative for chest pain.  Gastrointestinal: Negative for nausea, vomiting, abdominal pain, diarrhea and constipation.  Endocrine: Negative for polydipsia, polyphagia and polyuria.  Genitourinary: Negative for dysuria, urgency, frequency and hematuria.  Musculoskeletal: Negative for back pain and neck stiffness.  Skin: Negative for rash.  Allergic/Immunologic: Negative for immunocompromised state.  Neurological: Positive for headaches. Negative for syncope and light-headedness.  Hematological: Does not bruise/bleed easily.  Psychiatric/Behavioral: Negative for sleep disturbance. The patient is not nervous/anxious.       Allergies  Review of patient's allergies indicates no known allergies.  Home Medications   Prior to Admission medications   Medication Sig Start Date End Date Taking? Authorizing Provider  allopurinol (ZYLOPRIM) 300  MG tablet TAKE ONE TABLET BY MOUTH ONCE DAILY FOR GOUT 08/08/13  Yes Lauree Chandler, NP  carvedilol (COREG) 6.25 MG tablet Take 1 tablet (6.25 mg total) by mouth 2 (two) times daily. 11/04/14  Yes Gildardo Cranker, DO  Darunavir Ethanolate (PREZISTA) 800 MG tablet Take 1 tablet (800 mg total) by mouth daily with breakfast. 11/26/14  Yes Thayer Headings, MD  gabapentin (NEURONTIN) 100 MG capsule TAKE ONE CAPSULE BY MOUTH THREE TIMES DAILY 09/14/14  Yes Lauree Chandler, NP  lamivudine (EPIVIR) 100 MG tablet Take 0.5 tablets (50 mg total) by mouth daily. 11/26/14  Yes Thayer Headings, MD  lanthanum (FOSRENOL) 1000 MG chewable tablet Chew 1,000 mg by mouth 3 (three) times daily with meals.    Yes Historical Provider, MD  oxyCODONE-acetaminophen (PERCOCET/ROXICET) 5-325 MG per tablet Take 1 tablet by mouth every 6 (six) hours as needed for moderate pain. 10/16/14  Yes Excell Seltzer, MD  raltegravir (ISENTRESS) 400 MG tablet TAKE ONE TABLET BY MOUTH BY MOUTH TWICE DAILY 11/26/14  Yes Thayer Headings, MD  ritonavir (NORVIR) 100 MG TABS tablet Take 1 tablet (100 mg total) by mouth daily. 11/26/14  Yes Thayer Headings, MD  zidovudine (RETROVIR) 300 MG tablet Take 1 tablet (300 mg total) by mouth daily. 11/26/14  Yes Thayer Headings, MD  aspirin EC 325 MG tablet Take 1 tablet (325 mg total) by mouth daily. 07/01/14   Josue Hector, MD  levothyroxine (SYNTHROID, LEVOTHROID) 50 MCG tablet Take 1 tablet (50 mcg total) by mouth daily. Patient taking differently: Take 50 mcg by mouth daily before breakfast.  09/02/14   Mahima Pandey, MD   BP 180/86 mmHg  Pulse 49  Temp(Src) 98 F (36.7 C) (Oral)  Resp 18  SpO2 99% Physical Exam  Constitutional: He is oriented to person, place, and time. He appears well-developed and well-nourished. No distress.  HENT:  Head: Normocephalic and atraumatic.  Mouth/Throat: Oropharynx is clear and moist.  Eyes: Conjunctivae and EOM are normal. Pupils are equal, round, and reactive to  light. No scleral icterus.  No horizontal, vertical or rotational nystagmus  Neck: Normal range of motion. Neck supple.  Full active and passive ROM without pain No midline or paraspinal tenderness No nuchal rigidity or meningeal signs  Cardiovascular: Normal rate, regular rhythm, normal heart sounds and intact distal pulses.   No murmur heard. Pulmonary/Chest: Effort normal and breath sounds normal. No respiratory distress.  He has no wheezes. He has no rales.  Abdominal: Soft. Bowel sounds are normal. There is no tenderness. There is no rebound and no guarding.  Musculoskeletal: Normal range of motion.  Right lower arm fistula with palpable thrill  Lymphadenopathy:    He has no cervical adenopathy.  Neurological: He is alert and oriented to person, place, and time. He has normal reflexes. No cranial nerve deficit. He exhibits normal muscle tone. Coordination normal.  Mental Status:  Alert, oriented, thought content appropriate. Speech fluent without evidence of aphasia. Able to follow 2 step commands without difficulty.  Cranial Nerves:  II:  Peripheral visual fields grossly normal, pupils equal, round, reactive to light III,IV, VI: ptosis not present, extra-ocular motions intact bilaterally  V,VII: smile symmetric, facial light touch sensation equal VIII: hearing grossly normal bilaterally  IX,X: gag reflex present  XI: bilateral shoulder shrug equal and strong XII: midline tongue extension  Motor:  5/5 in upper and lower extremities bilaterally including strong and equal grip strength and dorsiflexion/plantar flexion Sensory: Pinprick and light touch normal in all extremities.  Deep Tendon Reflexes: 2+ and symmetric  Cerebellar: normal finger-to-nose with bilateral upper extremities Gait: normal gait and balance CV: distal pulses palpable throughout   Skin: Skin is warm and dry. No rash noted. He is not diaphoretic.  Psychiatric: He has a normal mood and affect. His behavior is  normal. Judgment and thought content normal.  Nursing note and vitals reviewed.   ED Course  Procedures (including critical care time) Labs Review Labs Reviewed  CBC WITH DIFFERENTIAL/PLATELET - Abnormal; Notable for the following:    RBC 3.09 (*)    Hemoglobin 10.0 (*)    HCT 32.5 (*)    MCV 105.2 (*)    All other components within normal limits  COMPREHENSIVE METABOLIC PANEL - Abnormal; Notable for the following:    Creatinine, Ser 7.61 (*)    Albumin 3.4 (*)    GFR calc non Af Amer 7 (*)    GFR calc Af Amer 8 (*)    All other components within normal limits  TROPONIN I - Abnormal; Notable for the following:    Troponin I 0.04 (*)    All other components within normal limits  URINALYSIS, ROUTINE W REFLEX MICROSCOPIC    Imaging Review Ct Head Wo Contrast  12/21/2014   CLINICAL DATA:  Headache.  Hypertension  EXAM: CT HEAD WITHOUT CONTRAST  TECHNIQUE: Contiguous axial images were obtained from the base of the skull through the vertex without intravenous contrast.  COMPARISON:  CT head 04/13/2014  FINDINGS: Mild to moderate atrophy.  Negative for hydrocephalus  Chronic microvascular ischemic changes in the white matter similar to the prior study  Negative for acute infarct.  Negative for hemorrhage or mass lesion.  Atherosclerotic calcification cavernous carotid bilaterally.  Calvarium intact.  IMPRESSION: Atrophy and chronic microvascular ischemia.  No acute abnormality.   Electronically Signed   By: Franchot Gallo M.D.   On: 12/21/2014 21:20   Dg Chest Port 1 View  12/21/2014   CLINICAL DATA:  Intermittent shortness of breath for 3-4 days.  EXAM: PORTABLE CHEST - 1 VIEW  COMPARISON:  CT of the chest 07/05/2013  FINDINGS: The heart is mildly enlarged. There are no focal consolidations or pleural effusions. There is mild pulmonary vascular congestion.  IMPRESSION: Cardiomegaly and mild vascular congestion.   Electronically Signed   By: Nolon Nations M.D.   On: 12/21/2014 22:09      EKG Interpretation  Date/Time:  Monday December 21 2014 20:32:20 EDT Ventricular Rate:  65 PR Interval:    QRS Duration: 100 QT Interval:  413 QTC Calculation: 429 R Axis:   105 Text Interpretation:  Atrial fibrillation Right axis deviation Low  voltage, extremity leads Borderline repolarization abnormality No  significant change was found Confirmed by [facility]  MD, TREY ([id number]) on  12/21/2014 10:16:34 PM      CRITICAL CARE Performed by: Abigail Butts Total critical care time: 91min Critical care time was exclusive of separately billable procedures and treating other patients. Critical care was necessary to treat or prevent imminent or life-threatening deterioration. Critical care was time spent personally by me on the following activities: development of treatment plan with patient and/or surrogate as well as nursing, discussions with consultants, evaluation of patient's response to treatment, examination of patient, obtaining history from patient or surrogate, ordering and performing treatments and interventions, ordering and review of laboratory studies, ordering and review of radiographic studies, pulse oximetry and re-evaluation of patient's condition.  MDM   Final diagnoses:  Elevated troponin  Hypertensive urgency  Chronic atrial fibrillation  Essential hypertension  End stage renal disease  Anemia in chronic renal disease   [name]. presents with HTN, headache.  He also reports intermittent SOB.  Concern for hypertensive urgency.  CT and labs pending.  Elevated troponin to 0.04.  Ct head negative for acute intercranial abnormality.  Pt placed in nitro drip.  ECG unchanged from previous with rate controlled a-fib.  He is CP free at this time.  Mild headache persists, normal neurologic exam.  Pt reports he has been taking his carvedilol as directed and does not have high blood pressure normally.  Pt will need dialysis tomorrow as scheduled.  The patient was  discussed with and seen by Dr. Doy Mince who agrees with the treatment plan.   BP 180/86 mmHg  Pulse 49  Temp(Src) 98 F (36.7 C) (Oral)  Resp 18  SpO2 99%   10:33 PM Discussed with Dr Chancy Milroy who will evaluate for admission.       Jarrett Soho Wolfgang Finigan, PA-C 12/21/14 [place], MD 12/22/14 1540

## 2014-12-22 LAB — CBC
HCT: 31.9 % — ABNORMAL LOW (ref 39.0–52.0)
HCT: 32 % — ABNORMAL LOW (ref 39.0–52.0)
HEMOGLOBIN: 10 g/dL — AB (ref 13.0–17.0)
Hemoglobin: 10.1 g/dL — ABNORMAL LOW (ref 13.0–17.0)
MCH: 33.6 pg (ref 26.0–34.0)
MCH: 32.8 pg (ref 26.0–34.0)
MCHC: 31.7 g/dL (ref 30.0–36.0)
MCHC: 31.3 g/dL (ref 30.0–36.0)
MCV: 106 fL — ABNORMAL HIGH (ref 78.0–100.0)
MCV: 104.9 fL — ABNORMAL HIGH (ref 78.0–100.0)
PLATELETS: 156 10*3/uL (ref 150–400)
Platelets: 151 10*3/uL (ref 150–400)
RBC: 3.01 MIL/uL — ABNORMAL LOW (ref 4.22–5.81)
RBC: 3.05 MIL/uL — AB (ref 4.22–5.81)
RDW: 15.6 % — AB (ref 11.5–15.5)
RDW: 15.4 % (ref 11.5–15.5)
WBC: 5 10*3/uL (ref 4.0–10.5)
WBC: 4.3 10*3/uL (ref 4.0–10.5)

## 2014-12-22 LAB — COMPREHENSIVE METABOLIC PANEL
ALT: 9 U/L (ref 0–53)
ANION GAP: 10 (ref 5–15)
AST: 16 U/L (ref 0–37)
Albumin: 3.4 g/dL — ABNORMAL LOW (ref 3.5–5.2)
Alkaline Phosphatase: 61 U/L (ref 39–117)
BILIRUBIN TOTAL: 0.6 mg/dL (ref 0.3–1.2)
BUN: 22 mg/dL (ref 6–23)
CHLORIDE: 100 mmol/L (ref 96–112)
CO2: 25 mmol/L (ref 19–32)
CREATININE: 7.84 mg/dL — AB (ref 0.50–1.35)
Calcium: 9.2 mg/dL (ref 8.4–10.5)
GFR calc non Af Amer: 6 mL/min — ABNORMAL LOW (ref >90)
GFR, EST AFRICAN AMERICAN: 7 mL/min — AB (ref >90)
GLUCOSE: 104 mg/dL — AB (ref 70–99)
POTASSIUM: 4 mmol/L (ref 3.5–5.1)
Sodium: 135 mmol/L (ref 135–145)
Total Protein: 7.1 g/dL (ref 6.0–8.3)

## 2014-12-22 LAB — CREATININE, SERUM
Creatinine, Ser: 7.8 mg/dL — ABNORMAL HIGH (ref 0.50–1.35)
GFR calc non Af Amer: 6 mL/min — ABNORMAL LOW (ref >90)
GFR, EST AFRICAN AMERICAN: 8 mL/min — AB (ref >90)

## 2014-12-22 LAB — TSH: TSH: 5.597 u[IU]/mL — ABNORMAL HIGH (ref 0.350–4.500)

## 2014-12-22 LAB — I-STAT TROPONIN, ED: TROPONIN I, POC: 0.02 ng/mL (ref 0.00–0.08)

## 2014-12-22 LAB — TROPONIN I
TROPONIN I: 0.03 ng/mL (ref <0.031)
Troponin I: 0.04 ng/mL — ABNORMAL HIGH (ref <0.031)
Troponin I: 0.04 ng/mL — ABNORMAL HIGH (ref <0.031)

## 2014-12-22 LAB — MRSA PCR SCREENING: MRSA BY PCR: POSITIVE — AB

## 2014-12-22 LAB — CBG MONITORING, ED: Glucose-Capillary: 91 mg/dL (ref 70–99)

## 2014-12-22 MED ORDER — RALTEGRAVIR POTASSIUM 400 MG PO TABS
400.0000 mg | ORAL_TABLET | Freq: Two times a day (BID) | ORAL | Status: DC
  Administered 2014-12-22 – 2014-12-24 (×5): 400 mg via ORAL
  Filled 2014-12-22 (×8): qty 1

## 2014-12-22 MED ORDER — NEPRO/CARBSTEADY PO LIQD: 237.0000 mL | ORAL | Status: DC | PRN

## 2014-12-22 MED ORDER — SODIUM CHLORIDE 0.9 % IJ SOLN
3.0000 mL | Freq: Two times a day (BID) | INTRAMUSCULAR | Status: DC
  Administered 2014-12-22 – 2014-12-24 (×4): 3 mL via INTRAVENOUS

## 2014-12-22 MED ORDER — ACETAMINOPHEN 650 MG RE SUPP: 650.0000 mg | Freq: Four times a day (QID) | RECTAL | Status: DC | PRN

## 2014-12-22 MED ORDER — GABAPENTIN 100 MG PO CAPS
100.0000 mg | ORAL_CAPSULE | Freq: Three times a day (TID) | ORAL | Status: DC
  Administered 2014-12-22 – 2014-12-24 (×6): 100 mg via ORAL
  Filled 2014-12-22 (×11): qty 1

## 2014-12-22 MED ORDER — HYDRALAZINE HCL 50 MG PO TABS
50.0000 mg | ORAL_TABLET | Freq: Three times a day (TID) | ORAL | Status: DC
  Administered 2014-12-22 – 2014-12-23 (×3): 50 mg via ORAL
  Filled 2014-12-22 (×6): qty 1

## 2014-12-22 MED ORDER — LEVOTHYROXINE SODIUM 50 MCG PO TABS
50.0000 ug | ORAL_TABLET | Freq: Every day | ORAL | Status: DC
  Administered 2014-12-24: 50 ug via ORAL
  Filled 2014-12-22 (×4): qty 1

## 2014-12-22 MED ORDER — PENTAFLUOROPROP-TETRAFLUOROETH EX AERO: 1.0000 "application " | INHALATION_SPRAY | CUTANEOUS | Status: DC | PRN

## 2014-12-22 MED ORDER — ONDANSETRON HCL 4 MG/2ML IJ SOLN: 4.0000 mg | Freq: Four times a day (QID) | INTRAMUSCULAR | Status: DC | PRN

## 2014-12-22 MED ORDER — ZIDOVUDINE 100 MG PO CAPS
300.0000 mg | ORAL_CAPSULE | Freq: Every day | ORAL | Status: DC
  Administered 2014-12-22 – 2014-12-24 (×3): 300 mg via ORAL
  Filled 2014-12-22 (×4): qty 3

## 2014-12-22 MED ORDER — ALTEPLASE 2 MG IJ SOLR
2.0000 mg | Freq: Once | INTRAMUSCULAR | Status: AC | PRN
  Filled 2014-12-22: qty 2

## 2014-12-22 MED ORDER — VITAMIN B-1 100 MG PO TABS
100.0000 mg | ORAL_TABLET | Freq: Every day | ORAL | Status: DC
  Administered 2014-12-22 – 2014-12-24 (×3): 100 mg via ORAL
  Filled 2014-12-22 (×3): qty 1

## 2014-12-22 MED ORDER — SODIUM CHLORIDE 0.9 % IV SOLN: 100.0000 mL | INTRAVENOUS | Status: DC | PRN

## 2014-12-22 MED ORDER — ACETAMINOPHEN 325 MG PO TABS
650.0000 mg | ORAL_TABLET | Freq: Four times a day (QID) | ORAL | Status: DC | PRN
  Administered 2014-12-22: 650 mg via ORAL

## 2014-12-22 MED ORDER — RITONAVIR 100 MG PO TABS
100.0000 mg | ORAL_TABLET | Freq: Every day | ORAL | Status: DC
  Administered 2014-12-22 – 2014-12-24 (×3): 100 mg via ORAL
  Filled 2014-12-22 (×4): qty 1

## 2014-12-22 MED ORDER — HEPARIN SODIUM (PORCINE) 5000 UNIT/ML IJ SOLN
5000.0000 [IU] | Freq: Three times a day (TID) | INTRAMUSCULAR | Status: DC
  Administered 2014-12-22 – 2014-12-24 (×5): 5000 [IU] via SUBCUTANEOUS
  Filled 2014-12-22 (×9): qty 1

## 2014-12-22 MED ORDER — AMLODIPINE BESYLATE 10 MG PO TABS
10.0000 mg | ORAL_TABLET | Freq: Every day | ORAL | Status: DC
  Administered 2014-12-22 – 2014-12-24 (×3): 10 mg via ORAL
  Filled 2014-12-22 (×3): qty 1

## 2014-12-22 MED ORDER — LAMIVUDINE 10 MG/ML PO SOLN
50.0000 mg | Freq: Every day | ORAL | Status: DC
  Administered 2014-12-22 – 2014-12-24 (×3): 50 mg via ORAL
  Filled 2014-12-22 (×4): qty 5

## 2014-12-22 MED ORDER — FOLIC ACID 1 MG PO TABS
1.0000 mg | ORAL_TABLET | Freq: Every day | ORAL | Status: DC
  Administered 2014-12-22 – 2014-12-24 (×3): 1 mg via ORAL
  Filled 2014-12-22 (×3): qty 1

## 2014-12-22 MED ORDER — ASPIRIN EC 325 MG PO TBEC
325.0000 mg | DELAYED_RELEASE_TABLET | Freq: Every day | ORAL | Status: DC
  Administered 2014-12-22 – 2014-12-24 (×3): 325 mg via ORAL
  Filled 2014-12-22 (×3): qty 1

## 2014-12-22 MED ORDER — CALCIUM CARBONATE 1250 MG/5ML PO SUSP
500.0000 mg | Freq: Four times a day (QID) | ORAL | Status: DC | PRN
  Filled 2014-12-22: qty 5

## 2014-12-22 MED ORDER — HEPARIN SODIUM (PORCINE) 1000 UNIT/ML DIALYSIS: 1000.0000 [IU] | INTRAMUSCULAR | Status: DC | PRN

## 2014-12-22 MED ORDER — SODIUM CHLORIDE 0.9 % IV SOLN
125.0000 mg | INTRAVENOUS | Status: DC
  Administered 2014-12-22: 125 mg via INTRAVENOUS
  Filled 2014-12-22 (×2): qty 10

## 2014-12-22 MED ORDER — ALLOPURINOL 300 MG PO TABS
300.0000 mg | ORAL_TABLET | Freq: Two times a day (BID) | ORAL | Status: DC
  Filled 2014-12-22 (×2): qty 1

## 2014-12-22 MED ORDER — OXYCODONE-ACETAMINOPHEN 5-325 MG PO TABS
1.0000 | ORAL_TABLET | Freq: Four times a day (QID) | ORAL | Status: DC | PRN
Start: 2014-12-22 — End: 2014-12-24
  Administered 2014-12-22: 1 via ORAL
  Filled 2014-12-22: qty 1

## 2014-12-22 MED ORDER — LIDOCAINE-PRILOCAINE 2.5-2.5 % EX CREA
1.0000 "application " | TOPICAL_CREAM | CUTANEOUS | Status: DC | PRN
  Filled 2014-12-22: qty 5

## 2014-12-22 MED ORDER — SODIUM CHLORIDE 0.9 % IJ SOLN: 3.0000 mL | INTRAMUSCULAR | Status: DC | PRN

## 2014-12-22 MED ORDER — LIDOCAINE HCL (PF) 1 % IJ SOLN: 5.0000 mL | INTRAMUSCULAR | Status: DC | PRN

## 2014-12-22 MED ORDER — ONDANSETRON HCL 4 MG PO TABS: 4.0000 mg | ORAL_TABLET | Freq: Four times a day (QID) | ORAL | Status: DC | PRN

## 2014-12-22 MED ORDER — ALLOPURINOL 300 MG PO TABS
300.0000 mg | ORAL_TABLET | Freq: Every day | ORAL | Status: DC
  Administered 2014-12-23 – 2014-12-24 (×2): 300 mg via ORAL
  Filled 2014-12-22 (×2): qty 1

## 2014-12-22 MED ORDER — NEPRO/CARBSTEADY PO LIQD
237.0000 mL | Freq: Three times a day (TID) | ORAL | Status: DC | PRN
Start: 2014-12-22 — End: 2014-12-24

## 2014-12-22 MED ORDER — LANTHANUM CARBONATE 500 MG PO CHEW
1000.0000 mg | CHEWABLE_TABLET | Freq: Three times a day (TID) | ORAL | Status: DC
  Administered 2014-12-22 – 2014-12-24 (×7): 1000 mg via ORAL
  Filled 2014-12-22 (×11): qty 2

## 2014-12-22 MED ORDER — CAMPHOR-MENTHOL 0.5-0.5 % EX LOTN
1.0000 | TOPICAL_LOTION | Freq: Three times a day (TID) | CUTANEOUS | Status: DC | PRN
Start: 2014-12-22 — End: 2014-12-24
  Filled 2014-12-22: qty 222

## 2014-12-22 MED ORDER — HYDRALAZINE HCL 25 MG PO TABS
25.0000 mg | ORAL_TABLET | Freq: Three times a day (TID) | ORAL | Status: DC
  Administered 2014-12-22: 25 mg via ORAL
  Filled 2014-12-22 (×4): qty 1

## 2014-12-22 MED ORDER — ADULT MULTIVITAMIN W/MINERALS CH
1.0000 | ORAL_TABLET | Freq: Every day | ORAL | Status: DC
  Administered 2014-12-22 – 2014-12-24 (×3): 1 via ORAL
  Filled 2014-12-22 (×3): qty 1

## 2014-12-22 MED ORDER — CARVEDILOL 6.25 MG PO TABS
6.2500 mg | ORAL_TABLET | Freq: Two times a day (BID) | ORAL | Status: DC
  Administered 2014-12-22 – 2014-12-23 (×4): 6.25 mg via ORAL
  Filled 2014-12-22 (×8): qty 1

## 2014-12-22 MED ORDER — SODIUM CHLORIDE 0.9 % IV SOLN: 250.0000 mL | INTRAVENOUS | Status: DC | PRN

## 2014-12-22 MED ORDER — DARUNAVIR ETHANOLATE 800 MG PO TABS
800.0000 mg | ORAL_TABLET | Freq: Every day | ORAL | Status: DC
  Administered 2014-12-23 – 2014-12-24 (×2): 800 mg via ORAL
  Filled 2014-12-22 (×6): qty 1

## 2014-12-22 MED ORDER — ACETAMINOPHEN 325 MG PO TABS
ORAL_TABLET | ORAL | Status: AC
  Filled 2014-12-22: qty 2

## 2014-12-22 MED ORDER — HYDROXYZINE HCL 25 MG PO TABS
25.0000 mg | ORAL_TABLET | Freq: Three times a day (TID) | ORAL | Status: DC | PRN
  Filled 2014-12-22: qty 1

## 2014-12-22 NOTE — ED Notes (Signed)
BS 91

## 2014-12-22 NOTE — Progress Notes (Signed)
[facility] Renal Consultation Note  Indication for Consultation:  Management of ESRD/hemodialysis; anemia, hypertension/volume and secondary hyperparathyroidism  HPI: Bryan Gilmore. is a 64 y.o. male with a history of HIV, Fournier's gangrene, and ESRD on dialysis at the [facility] who receives home visits by a nurse on MWF for an umbilical wound care, secondary to ventral hernia repair 2/1 by Dr. Excell Seltzer, and during yesterday's visit was noted to have a blood pressure of 239/110, for which he was sent to the hospital for evaluation.  He was placed on IV Nitroglycerin, as well as PO Amlodipine, Carvedilol, and Hydralazine, but takes no outpatient medications.  He notes that his blood pressure has increased over the last two weeks and that his dry weight has been decreased at dialysis.  He states that he has not been eating as well recently, but denies any specific complaints.     Dialysis Orders:  TTS @ [place] 4 hrs    98.5 kg     3K/2.25Ca      450/A1.5       No Heparin       AVF @ [facility]FA Calcitriol 0.75 mcg         Mircera 200 mcg q2wk (last 4/5)        Venofer 100 mg x 10 (starting 4/5)  Past Medical History  Diagnosis Date  . HIV (human immunodeficiency virus infection)   . Sepsis(995.91)   . Gout   . Fournier's gangrene   . Severe protein-calorie malnutrition   . Alcohol abuse   . Dysrhythmia     AFib, not a candidate for coumadin , dr [name] signed off on pt  . Hypertension   . Colostomy status   . Sebaceous cyst   . Cough   . Unspecified constipation   . GERD (gastroesophageal reflux disease)   . Atrial fibrillation   . Ulcer of lower limb, unspecified   . Ulcer of lower limb, unspecified   . Other severe protein-calorie malnutrition   . Disorders of phosphorus metabolism   . Hypopotassemia   . Altered mental status   . Hemoptysis, unspecified   . Acute respiratory failure   . Gangrene   . Sepsis(995.91)   . Peripheral vascular disease,  unspecified     denies  . Hypothyroidism   . Myocardial infarction     "told by a dr I had evidence of one that I didn't know about" (10/12/2014)  . Anemia   . Hepatitis C     "I've been treated for all of them" (10/12/2014)  . Hepatitis A   . Hepatitis B   . Gouty arthritis   . Arthritis     "joints" (10/12/2014)  . ESRD (end stage renal disease) on dialysis     "TTS; [facility] " (10/12/2014)  . End stage renal disease   . Anemia in chronic kidney disease(285.21)    Past Surgical History  Procedure Laterality Date  . Cystoscopy with urethral dilatation  02/17/2012    Procedure: CYSTOSCOPY WITH URETHRAL DILATATION;  Surgeon: Reece Packer, MD;  Location: [facility];  Service: Urology;  Laterality: N/A;  retrograde urethragram, placement of foley catheter exam under anesthesia   . Insertion of dialysis catheter  02/20/2012    Procedure: INSERTION OF DIALYSIS CATHETER;  Surgeon: Angelia Mould, MD;  Location: [place];  Service: Vascular;  Laterality: N/A;  . Irrigation and debridement abscess  02/29/2012    Procedure: IRRIGATION AND DEBRIDEMENT ABSCESS;  Surgeon: Reece Packer, MD;  Location: [place];  Service: Urology;  Laterality: N/A;  I&D of scrotum abcess  . Insertion of dialysis catheter  03/29/2012    Procedure: INSERTION OF DIALYSIS CATHETER;  Surgeon: Rosetta Posner, MD;  Location: [facility] OR;  Service: Vascular;  Laterality: N/A;  Insertion tunneled dialysis catheter right IJ  . Av fistula placement  04/12/2012    Procedure: INSERTION OF ARTERIOVENOUS (AV) GORE-TEX GRAFT ARM;  Surgeon: Rosetta Posner, MD;  Location: [facility] OR;  Service: Vascular;  Laterality: Left;  insertion of 6x50 stretch goretex graft   . Thrombectomy and revision of arterioventous (av) goretex  graft  07/15/2012    Procedure: THROMBECTOMY AND REVISION OF ARTERIOVENTOUS (AV) GORETEX  GRAFT;  Surgeon: Elam Dutch, MD;  Location: [place];  Service: Vascular;  Laterality: Left;  . Urethrogram  07/30/2012     Procedure: URETHROGRAM;  Surgeon: Reece Packer, MD;  Location: [facility];  Service: Urology;  Laterality: N/A;  cystogram  . Cystoscopy  07/30/2012    Procedure: CYSTOSCOPY;  Surgeon: Reece Packer, MD;  Location: [facility];  Service: Urology;  Laterality: Bilateral;  retrograde  . Insertion of dialysis catheter  08/04/2012    Procedure: INSERTION OF DIALYSIS CATHETER;  Surgeon: Angelia Mould, MD;  Location: [place];  Service: Vascular;  Laterality: Right;  right intrajugular hemodialysis catheter insertion  . Av fistula placement  08/14/2012    Procedure: INSERTION OF ARTERIOVENOUS (AV) GORE-TEX GRAFT ARM;  Surgeon: Mal Misty, MD;  Location: [place];  Service: Vascular;  Laterality: Left;  . Colostomy  02/2012    d/t an infection  . Av fistula placement Right 11/08/2012    Procedure: ARTERIOVENOUS (AV) FISTULA CREATION;  Surgeon: Angelia Mould, MD;  Location: [place];  Service: Vascular;  Laterality: Right;  . Surgery for fournier's gangrene  02/2012  . Colostomy  13  . Colostomy closure N/A 02/21/2013    Procedure: COLOSTOMY CLOSURE;  Surgeon: [name], MD;  Location: [place];  Service: General;  Laterality: N/A;  takedown of sigmoid colostomy with anastomosis  . Bowel resection N/A 02/21/2013    Procedure: SMALL BOWEL RESECTION;  Surgeon: [name], MD;  Location: [place];  Service: General;  Laterality: N/A;  . Fistulogram Right 01/06/2013    Procedure: FISTULOGRAM;  Surgeon: Angelia Mould, MD;  Location: [facility] CATH LAB;  Service: Cardiovascular;  Laterality: Right;  . Ventral hernia repair  10/12/2014  . Hernia repair    . Ventral hernia repair N/A 10/12/2014    Procedure: REPAIR VENTRAL INCISIONAL HERNIA;  Surgeon: Excell Seltzer, MD;  Location: [place];  Service: General;  Laterality: N/A;  . Insertion of mesh N/A 10/12/2014    Procedure: INSERTION OF MESH;  Surgeon: Excell Seltzer, MD;  Location: [facility] OR;  Service: General;  Laterality: N/A;   Family  History  Problem Relation Age of Onset  . Hypertension Mother   . Hypertension Father   . Cancer Cousin     breast   Social History  He denies any history of tobacco, alcohol, or illicit drug use.  No Known Allergies Prior to Admission medications   Medication Sig Start Date End Date Taking? Authorizing Provider  allopurinol (ZYLOPRIM) 300 MG tablet TAKE ONE TABLET BY MOUTH ONCE DAILY FOR GOUT 08/08/13  Yes Lauree Chandler, NP  carvedilol (COREG) 6.25 MG tablet Take 1 tablet (6.25 mg total) by mouth 2 (two) times daily. 11/04/14  Yes Gildardo Cranker,  DO  Darunavir Ethanolate (PREZISTA) 800 MG tablet Take 1 tablet (800 mg total) by mouth daily with breakfast. 11/26/14  Yes Thayer Headings, MD  gabapentin (NEURONTIN) 100 MG capsule TAKE ONE CAPSULE BY MOUTH THREE TIMES DAILY 09/14/14  Yes Lauree Chandler, NP  lamivudine (EPIVIR) 100 MG tablet Take 0.5 tablets (50 mg total) by mouth daily. 11/26/14  Yes Thayer Headings, MD  lanthanum (FOSRENOL) 1000 MG chewable tablet Chew 1,000 mg by mouth 3 (three) times daily with meals.    Yes Historical Provider, MD  oxyCODONE-acetaminophen (PERCOCET/ROXICET) 5-325 MG per tablet Take 1 tablet by mouth every 6 (six) hours as needed for moderate pain. 10/16/14  Yes Excell Seltzer, MD  raltegravir (ISENTRESS) 400 MG tablet TAKE ONE TABLET BY MOUTH BY MOUTH TWICE DAILY 11/26/14  Yes Thayer Headings, MD  ritonavir (NORVIR) 100 MG TABS tablet Take 1 tablet (100 mg total) by mouth daily. 11/26/14  Yes Thayer Headings, MD  zidovudine (RETROVIR) 300 MG tablet Take 1 tablet (300 mg total) by mouth daily. 11/26/14  Yes Thayer Headings, MD  aspirin EC 325 MG tablet Take 1 tablet (325 mg total) by mouth daily. 07/01/14   Josue Hector, MD  levothyroxine (SYNTHROID, LEVOTHROID) 50 MCG tablet Take 1 tablet (50 mcg total) by mouth daily. Patient taking differently: Take 50 mcg by mouth daily before breakfast.  09/02/14   Blanchie Serve, MD   Labs:  Results for orders placed or  performed during the hospital encounter of 12/21/14 (from the past 48 hour(s))  CBC with Differential/Platelet     Status: Abnormal   Collection Time: 12/21/14  8:21 PM  Result Value Ref Range   WBC 4.6 4.0 - 10.5 K/uL   RBC 3.09 (L) 4.22 - 5.81 MIL/uL   Hemoglobin 10.0 (L) 13.0 - 17.0 g/dL   HCT 32.5 (L) 39.0 - 52.0 %   MCV 105.2 (H) 78.0 - 100.0 fL   MCH 32.4 26.0 - 34.0 pg   MCHC 30.8 30.0 - 36.0 g/dL   RDW 15.4 11.5 - 15.5 %   Platelets 152 150 - 400 K/uL   Neutrophils Relative % 58 43 - 77 %   Neutro Abs 2.6 1.7 - 7.7 K/uL   Lymphocytes Relative 34 12 - 46 %   Lymphs Abs 1.6 0.7 - 4.0 K/uL   Monocytes Relative 6 3 - 12 %   Monocytes Absolute 0.3 0.1 - 1.0 K/uL   Eosinophils Relative 2 0 - 5 %   Eosinophils Absolute 0.1 0.0 - 0.7 K/uL   Basophils Relative 0 0 - 1 %   Basophils Absolute 0.0 0.0 - 0.1 K/uL  Comprehensive metabolic panel     Status: Abnormal   Collection Time: 12/21/14  8:21 PM  Result Value Ref Range   Sodium 136 135 - 145 mmol/L   Potassium 4.2 3.5 - 5.1 mmol/L   Chloride 100 96 - 112 mmol/L   CO2 27 19 - 32 mmol/L   Glucose, Bld 93 70 - 99 mg/dL   BUN 20 6 - 23 mg/dL   Creatinine, Ser 7.61 (H) 0.50 - 1.35 mg/dL   Calcium 9.2 8.4 - 10.5 mg/dL   Total Protein 6.9 6.0 - 8.3 g/dL   Albumin 3.4 (L) 3.5 - 5.2 g/dL   AST 13 0 - 37 U/L   ALT 8 0 - 53 U/L   Alkaline Phosphatase 60 39 - 117 U/L   Total Bilirubin 0.7 0.3 - 1.2 mg/dL  GFR calc non Af Amer 7 (L) >90 mL/min   GFR calc Af Amer 8 (L) >90 mL/min    Comment: (NOTE) The eGFR has been calculated using the CKD EPI equation. This calculation has not been validated in all clinical situations. eGFR's persistently <90 mL/min signify possible Chronic Kidney Disease.    Anion gap 9 5 - 15  Troponin I     Status: Abnormal   Collection Time: 12/21/14  8:21 PM  Result Value Ref Range   Troponin I 0.04 (H) <0.031 ng/mL    Comment:        PERSISTENTLY INCREASED TROPONIN VALUES IN THE RANGE OF  0.04-0.49 ng/mL CAN BE SEEN IN:       -UNSTABLE ANGINA       -CONGESTIVE HEART FAILURE       -MYOCARDITIS       -CHEST TRAUMA       -ARRYHTHMIAS       -LATE PRESENTING MYOCARDIAL INFARCTION       -COPD   CLINICAL FOLLOW-UP RECOMMENDED.   Urinalysis, Routine w reflex microscopic     Status: Abnormal   Collection Time: 12/21/14  9:36 PM  Result Value Ref Range   Color, Urine YELLOW YELLOW   APPearance CLOUDY (A) CLEAR   Specific Gravity, Urine 1.014 1.005 - 1.030   pH 8.0 5.0 - 8.0   Glucose, UA NEGATIVE NEGATIVE mg/dL   Hgb urine dipstick TRACE (A) NEGATIVE   Bilirubin Urine NEGATIVE NEGATIVE   Ketones, ur NEGATIVE NEGATIVE mg/dL   Protein, ur >300 (A) NEGATIVE mg/dL   Urobilinogen, UA 0.2 0.0 - 1.0 mg/dL   Nitrite NEGATIVE NEGATIVE   Leukocytes, UA MODERATE (A) NEGATIVE  Urine microscopic-add on     Status: Abnormal   Collection Time: 12/21/14  9:36 PM  Result Value Ref Range   Squamous Epithelial / LPF RARE RARE   WBC, UA 11-20 <3 WBC/hpf   RBC / HPF 0-2 <3 RBC/hpf   Bacteria, UA RARE RARE   Casts HYALINE CASTS (A) NEGATIVE  CBC     Status: Abnormal   Collection Time: 12/22/14  2:29 AM  Result Value Ref Range   WBC 5.0 4.0 - 10.5 K/uL   RBC 3.01 (L) 4.22 - 5.81 MIL/uL   Hemoglobin 10.1 (L) 13.0 - 17.0 g/dL   HCT 31.9 (L) 39.0 - 52.0 %   MCV 106.0 (H) 78.0 - 100.0 fL   MCH 33.6 26.0 - 34.0 pg   MCHC 31.7 30.0 - 36.0 g/dL   RDW 15.6 (H) 11.5 - 15.5 %   Platelets 156 150 - 400 K/uL  Creatinine, serum     Status: Abnormal   Collection Time: 12/22/14  2:29 AM  Result Value Ref Range   Creatinine, Ser 7.80 (H) 0.50 - 1.35 mg/dL   GFR calc non Af Amer 6 (L) >90 mL/min   GFR calc Af Amer 8 (L) >90 mL/min    Comment: (NOTE) The eGFR has been calculated using the CKD EPI equation. This calculation has not been validated in all clinical situations. eGFR's persistently <90 mL/min signify possible Chronic Kidney Disease.   TSH     Status: Abnormal   Collection Time:  12/22/14  2:29 AM  Result Value Ref Range   TSH 5.597 (H) 0.350 - 4.500 uIU/mL  Troponin I     Status: Abnormal   Collection Time: 12/22/14  2:29 AM  Result Value Ref Range   Troponin I 0.04 (H) <0.031 ng/mL    Comment:  PERSISTENTLY INCREASED TROPONIN VALUES IN THE RANGE OF 0.04-0.49 ng/mL CAN BE SEEN IN:       -UNSTABLE ANGINA       -CONGESTIVE HEART FAILURE       -MYOCARDITIS       -CHEST TRAUMA       -ARRYHTHMIAS       -LATE PRESENTING MYOCARDIAL INFARCTION       -COPD   CLINICAL FOLLOW-UP RECOMMENDED.   Comprehensive metabolic panel     Status: Abnormal   Collection Time: 12/22/14  2:29 AM  Result Value Ref Range   Sodium 135 135 - 145 mmol/L   Potassium 4.0 3.5 - 5.1 mmol/L   Chloride 100 96 - 112 mmol/L   CO2 25 19 - 32 mmol/L   Glucose, Bld 104 (H) 70 - 99 mg/dL   BUN 22 6 - 23 mg/dL   Creatinine, Ser 7.84 (H) 0.50 - 1.35 mg/dL   Calcium 9.2 8.4 - 10.5 mg/dL   Total Protein 7.1 6.0 - 8.3 g/dL   Albumin 3.4 (L) 3.5 - 5.2 g/dL   AST 16 0 - 37 U/L   ALT 9 0 - 53 U/L   Alkaline Phosphatase 61 39 - 117 U/L   Total Bilirubin 0.6 0.3 - 1.2 mg/dL   GFR calc non Af Amer 6 (L) >90 mL/min   GFR calc Af Amer 7 (L) >90 mL/min    Comment: (NOTE) The eGFR has been calculated using the CKD EPI equation. This calculation has not been validated in all clinical situations. eGFR's persistently <90 mL/min signify possible Chronic Kidney Disease.    Anion gap 10 5 - 15  I-Stat Troponin, ED (not at [facility])     Status: None   Collection Time: 12/22/14  5:23 AM  Result Value Ref Range   Troponin i, poc 0.02 0.00 - 0.08 ng/mL   Comment 3            Comment: Due to the release kinetics of cTnI, a negative result within the first hours of the onset of symptoms does not rule out myocardial infarction with certainty. If myocardial infarction is still suspected, repeat the test at appropriate intervals.   CBG monitoring, ED     Status: None   Collection Time: 12/22/14  5:32 AM   Result Value Ref Range   Glucose-Capillary 91 70 - 99 mg/dL  Troponin I     Status: Abnormal   Collection Time: 12/22/14  8:10 AM  Result Value Ref Range   Troponin I 0.04 (H) <0.031 ng/mL    Comment:        PERSISTENTLY INCREASED TROPONIN VALUES IN THE RANGE OF 0.04-0.49 ng/mL CAN BE SEEN IN:       -UNSTABLE ANGINA       -CONGESTIVE HEART FAILURE       -MYOCARDITIS       -CHEST TRAUMA       -ARRYHTHMIAS       -LATE PRESENTING MYOCARDIAL INFARCTION       -COPD   CLINICAL FOLLOW-UP RECOMMENDED.    Constitutional: negative for chills, fatigue, fevers and sweats Ears, nose, mouth, throat, and face: negative for earaches, hoarseness, nasal congestion and sore throat Respiratory: negative for cough, dyspnea on exertion, hemoptysis and sputum Cardiovascular: positive for occasional mild orthopnea, negative for chest pain, dyspnea, fatigue and palpitations Gastrointestinal: negative for abdominal pain, change in bowel habits, nausea and vomiting Genitourinary:negative, oliguric Musculoskeletal:negative for arthralgias, back pain, myalgias and neck pain Neurological: negative for dizziness,  gait problems, headaches, paresthesia and speech problems  Physical Exam: Filed Vitals:   12/22/14 1026  BP: 134/77  Pulse: 67  Temp:   Resp:      General appearance: alert, cooperative and no distress Head: Normocephalic, without obvious abnormality, atraumatic Neck: no adenopathy, no carotid bruit, no JVD and supple, symmetrical, trachea midline Resp: clear to auscultation bilaterally Cardio: regular rate and rhythm, S1, S2 normal, no murmur, click, rub or gallop GI: soft, non-tender; bowel sounds norma, packed umbilical wound with no drainage Extremities: extremities normal, atraumatic, no cyanosis or edema Neurologic: Grossly normal Dialysis Access: AVF @ RFA    Assessment/Plan: 1. Hypertensive emergency - initially 239/110, IV NTG now stopped, remains on Amlodipine, Carvedilol, &  Hydralazine; BP improving, likely needs EDW decreased sec to decreased appetite.  Evaluate tomorrow for possible extra HD. 2. ESRD - HD on TTS @ [place], K 4.  HD today. 3. Anemia - Hgb 10.1 on outpatient Mircera 200 mg q2wk, last given 4/5, & Fe loading, starting 4/5.  4. Metabolic bone disease - Ca 9.2 (9.7 corrected); Calcitriol 0.75 mcg, Fosrenol 1 g with meals. 5. Nutrition - Alb 3.4, renal carb-mod diet, vitamin. 6. HIV - antivirals, followed by Dr. Baxter Flattery. 7. Ventral hernia repair - 2/1 by Dr. Excell Seltzer, healing slowly, now only umbilical wound. 8. Hx Fournier's gangrene - in 02/2012, required [place] procedure with diverting sigmoid colostomy, reversed 02/21/13.  Macklin Jacquin 12/22/2014, 10:50 AM   Attending Nephrologist:  Edrick Oh, MD

## 2014-12-22 NOTE — ED Notes (Signed)
Patient is watching TV 

## 2014-12-22 NOTE — ED Notes (Signed)
Transporting to dialysis via stretcher w/telemetry.

## 2014-12-22 NOTE — Progress Notes (Signed)
Hemodialysis- tolerated treatment well without issue. No further complaints of headache. Transferred to [facility], report given to RN prior. Notified for hydralazine from pharmacy. Pt transferred in stable condition .

## 2014-12-22 NOTE — ED Notes (Signed)
The patient complained of chest pain.  A repeat EKG was done at 0458 and MD ordered an Istat as well.

## 2014-12-22 NOTE — ED Notes (Signed)
The patient is resting watching TV.

## 2014-12-22 NOTE — Progress Notes (Addendum)
[facility] PROGRESS NOTE  [name]. YQM:[id number] DOB: November 29, 1950 DOA: 12/21/2014 PCP: Blanchie Serve, MD  Brief narrative 64 y/o male with ESRD on HD ( Tu, th, sat; sees Dr Moshe Cipro), HTN, HIV on ART, hep C, Afib not on anticoagulation, Gout, hypothyroidism, anemia of CKD, presented with hypertensive urgency. Pt reports high BP for past 1 week with SBP >200 on the day of admission. Reports some shortness of breath for past 2 weeks and headache for past few days.. It appears that patient has had elevated blood pressure in the past and possibly not adherent with his medication. In the ED blood pressure was 201/95 mmHg. Patient started on nitroglycerin drip and admitted to stepdown unit.   Assessment/Plan: Hypertensive urgency Started on nitroglycerin drip. Patient reported headache and some chest discomfort on presentation which has now resolved. Resume Coreg. Will add amlodipine 10 mg and hydralazine 25 mg 3 times a day ,adjust dose and  titrate the nitroglycerin drip. He is due for dialysis today and hopefully will help reduce his blood pressure as well. Patient counseled on dietary adherence and medication compliance. Check 2D echo.  End-stage renal disease on hemodialysis Patient scheduled for dialysis today. Seen by renal consult.contineu calcium carbonate.  HIV Continue ART. Follows with Dr. Drucilla Schmidt  Chronic Hep c  treated.  Anemia of chronic disease Continue ferric gluconate IV with dialysis  Hypothyroidism Continue Synthroid  S/p ventral incision hernia with mesh Has a small abdominal wall opening with dressing applied. He has routine follow up with Dr Excell Seltzer as outpt tomorrow at 1:45pm. If he is still inpatient pls call surgery.  elevated troponin Nonspecific with underlying ESRD. Troponin peaked at 0.04. He does have TWI in alteral leads but pt is asymptomatic.   Afib  rate controlled. on full dose ASA and coreg. Not considered candidate for  anticoagulation per Dr [name] due to his co morbidities.  DVT prophylaxis  Diet: Renal/ 2gm Na  Code Status: full code Family Communication: none at bedside Disposition Plan: continue Stepdown monitoring. Home in next 24 -48 hrs once BO improves   Consultants:  renal  Procedures:  HD  Antibiotics:  none  HPI/Subjective: Recent seen and examined. Admission H&P reviewed. Denies any headache or chest pain.  Objective: Filed Vitals:   12/22/14 0846  BP: 191/109  Pulse: 65  Temp: 97.5 F (36.4 C)  Resp: 26    Intake/Output Summary (Last 24 hours) at 12/22/14 0857 Last data filed at 12/22/14 0831  Gross per 24 hour  Intake      0 ml  Output      0 ml  Net      0 ml   Filed Weights    Exam:   General:  Elderly male in no acute distress  HEENT: No pallor, moist oral mucosa, supple neck, no JVD  Chest: Clear to auscultation bilaterally, no added sounds  CVS: S1 and S2 irregular, no murmurs rub or gallop  GI: dressing over  Small abdominal  wall wound, no discharge,  Soft, nondistended, nontender, bowel sounds present  Musculoskeletal: Warm, trace edema, rt UE AV graft  CNS: alert and oreinted  Data Reviewed: Basic Metabolic Panel:  Recent Labs Lab 12/21/14 2021 12/22/14 0229  NA 136 135  K 4.2 4.0  CL 100 100  CO2 27 25  GLUCOSE 93 104*  BUN 20 22  CREATININE 7.61* 7.80*  7.84*  CALCIUM 9.2 9.2   Liver Function Tests:  Recent Labs Lab 12/21/14 2021 12/22/14 0229  AST 13 16  ALT 8 9  ALKPHOS 60 61  BILITOT 0.7 0.6  PROT 6.9 7.1  ALBUMIN 3.4* 3.4*   No results for input(s): LIPASE, AMYLASE in the last 168 hours. No results for input(s): AMMONIA in the last 168 hours. CBC:  Recent Labs Lab 12/21/14 2021 12/22/14 0229  WBC 4.6 5.0  NEUTROABS 2.6  --   HGB 10.0* 10.1*  HCT 32.5* 31.9*  MCV 105.2* 106.0*  PLT 152 156   Cardiac Enzymes:  Recent Labs Lab 12/21/14 2021 12/22/14 0229  TROPONINI 0.04* 0.04*   BNP (last 3  results) No results for input(s): BNP in the last 8760 hours.  ProBNP (last 3 results) No results for input(s): PROBNP in the last 8760 hours.  CBG:  Recent Labs Lab 12/22/14 0532  GLUCAP 91    No results found for this or any previous visit (from the past 240 hour(s)).   Studies: Ct Head Wo Contrast  12/21/2014   CLINICAL DATA:  Headache.  Hypertension  EXAM: CT HEAD WITHOUT CONTRAST  TECHNIQUE: Contiguous axial images were obtained from the base of the skull through the vertex without intravenous contrast.  COMPARISON:  CT head 04/13/2014  FINDINGS: Mild to moderate atrophy.  Negative for hydrocephalus  Chronic microvascular ischemic changes in the white matter similar to the prior study  Negative for acute infarct.  Negative for hemorrhage or mass lesion.  Atherosclerotic calcification cavernous carotid bilaterally.  Calvarium intact.  IMPRESSION: Atrophy and chronic microvascular ischemia.  No acute abnormality.   Electronically Signed   By: Franchot Gallo M.D.   On: 12/21/2014 21:20   Dg Chest Port 1 View  12/21/2014   CLINICAL DATA:  Intermittent shortness of breath for 3-4 days.  EXAM: PORTABLE CHEST - 1 VIEW  COMPARISON:  CT of the chest 07/05/2013  FINDINGS: The heart is mildly enlarged. There are no focal consolidations or pleural effusions. There is mild pulmonary vascular congestion.  IMPRESSION: Cardiomegaly and mild vascular congestion.   Electronically Signed   By: Nolon Nations M.D.   On: 12/21/2014 22:09    Scheduled Meds: . allopurinol  300 mg Oral BID  . amLODipine  10 mg Oral Daily  . aspirin EC  325 mg Oral Daily  . carvedilol  6.25 mg Oral BID WC  . folic acid  1 mg Oral Daily  . gabapentin  100 mg Oral TID  . heparin  5,000 Units Subcutaneous 3 times per day  . hydrALAZINE  25 mg Oral 3 times per day  . lamiVUDine  50 mg Oral Daily  . lanthanum  1,000 mg Oral TID WC  . levothyroxine  50 mcg Oral QAC breakfast  . multivitamin with minerals  1 tablet Oral  Daily  . raltegravir  400 mg Oral BID  . ritonavir  100 mg Oral Q breakfast  . thiamine  100 mg Oral Daily  . zidovudine  300 mg Oral Daily   Continuous Infusions:     Time spent: [place], [place]  [facility] Pager [phone]. If 7PM-7AM, please contact night-coverage at [email or web address], password [facility] 12/22/2014, 8:57 AM  LOS: 1 day

## 2014-12-22 NOTE — ED Notes (Signed)
Admitting MD in w pt

## 2014-12-22 NOTE — Procedures (Signed)
I have seen and examined this patient and agree with the plan of care . Patient is doing well on dialysis and is stable with treatment will remove volume and assess blood pressure Bryan Gilmore W 12/22/2014, 11:03 AM

## 2014-12-22 NOTE — Progress Notes (Signed)
Hemodialysis- Pt arrived to unit from ED on stretcher. In no distress, vitals stable. BP 191/109. Pt has no complaints. Initiated on HD without issue.

## 2014-12-23 DIAGNOSIS — R079 Chest pain, unspecified: Secondary | ICD-10-CM

## 2014-12-23 LAB — BASIC METABOLIC PANEL
Anion gap: 11 (ref 5–15)
BUN: 15 mg/dL (ref 6–23)
CALCIUM: 9.6 mg/dL (ref 8.4–10.5)
CO2: 23 mmol/L (ref 19–32)
CREATININE: 5.9 mg/dL — AB (ref 0.50–1.35)
Chloride: 100 mmol/L (ref 96–112)
GFR calc Af Amer: 11 mL/min — ABNORMAL LOW (ref >90)
GFR, EST NON AFRICAN AMERICAN: 9 mL/min — AB (ref >90)
Glucose, Bld: 85 mg/dL (ref 70–99)
Potassium: 4.1 mmol/L (ref 3.5–5.1)
Sodium: 134 mmol/L — ABNORMAL LOW (ref 135–145)

## 2014-12-23 LAB — HEMOGLOBIN A1C
HEMOGLOBIN A1C: 4.7 % — AB (ref 4.8–5.6)
MEAN PLASMA GLUCOSE: 88 mg/dL

## 2014-12-23 LAB — GLUCOSE, CAPILLARY: Glucose-Capillary: 100 mg/dL — ABNORMAL HIGH (ref 70–99)

## 2014-12-23 MED ORDER — HYDRALAZINE HCL 25 MG PO TABS
75.0000 mg | ORAL_TABLET | Freq: Three times a day (TID) | ORAL | Status: DC
  Administered 2014-12-23 – 2014-12-24 (×4): 75 mg via ORAL
  Filled 2014-12-23 (×6): qty 1

## 2014-12-23 MED ORDER — CHLORHEXIDINE GLUCONATE CLOTH 2 % EX PADS
6.0000 | MEDICATED_PAD | Freq: Every day | CUTANEOUS | Status: DC
  Administered 2014-12-23 – 2014-12-24 (×2): 6 via TOPICAL

## 2014-12-23 MED ORDER — MUPIROCIN 2 % EX OINT
1.0000 "application " | TOPICAL_OINTMENT | Freq: Two times a day (BID) | CUTANEOUS | Status: DC
  Administered 2014-12-23 – 2014-12-24 (×3): 1 via NASAL
  Filled 2014-12-23: qty 22

## 2014-12-23 NOTE — Progress Notes (Signed)
[facility] ROUNDING NOTE   Subjective:   Interval History: much improved will plan dialysis in AM  Objective:  Vital signs in last 24 hours:  Temp:  [97.6 F (36.4 C)-98.3 F (36.8 C)] 97.6 F (36.4 C) (04/13 0756) Pulse Rate:  [41-78] 76 (04/13 0900) Resp:  [0-26] 5 (04/13 0900) BP: (142-192)/(61-109) 178/94 mmHg (04/13 0900) SpO2:  [94 %-100 %] 100 % (04/13 0900) Weight:  [95.9 kg (211 lb 6.7 oz)-96.3 kg (212 lb 4.9 oz)] 96.3 kg (212 lb 4.9 oz) (04/13 0500)  Weight change:  Filed Weights   12/22/14 1237 12/23/14 0500  Weight: 95.9 kg (211 lb 6.7 oz) 96.3 kg (212 lb 4.9 oz)    Intake/Output: I/O last 3 completed shifts: In: 350 [P.O.:240; IV Piggyback:110] Out: 3500 [Urine:200; Other:3300]   Intake/Output this shift:     CVS- RRR RS- CTA ABD- BS present soft non-distended  Abdominal wound with dressing EXT- no edema AVF thrill and bruit Basic Metabolic Panel:  Recent Labs Lab 12/21/14 2021 12/22/14 0229 12/23/14 0319  NA 136 135 134*  K 4.2 4.0 4.1  CL 100 100 100  CO2 27 25 23   GLUCOSE 93 104* 85  BUN 20 22 15   CREATININE 7.61* 7.80*  7.84* 5.90*  CALCIUM 9.2 9.2 9.6    Liver Function Tests:  Recent Labs Lab 12/21/14 2021 12/22/14 0229  AST 13 16  ALT 8 9  ALKPHOS 60 61  BILITOT 0.7 0.6  PROT 6.9 7.1  ALBUMIN 3.4* 3.4*   No results for input(s): LIPASE, AMYLASE in the last 168 hours. No results for input(s): AMMONIA in the last 168 hours.  CBC:  Recent Labs Lab 12/21/14 2021 12/22/14 0229 12/22/14 1510  WBC 4.6 5.0 4.3  NEUTROABS 2.6  --   --   HGB 10.0* 10.1* 10.0*  HCT 32.5* 31.9* 32.0*  MCV 105.2* 106.0* 104.9*  PLT 152 156 151    Cardiac Enzymes:  Recent Labs Lab 12/21/14 2021 12/22/14 0229 12/22/14 0810 12/22/14 1510  TROPONINI 0.04* 0.04* 0.04* 0.03    BNP: Invalid input(s): POCBNP  CBG:  Recent Labs Lab 12/22/14 0532 12/23/14 0754  GLUCAP 13 100*    Microbiology: Results for orders  placed or performed during the hospital encounter of 12/21/14  MRSA PCR Screening     Status: Abnormal   Collection Time: 12/22/14 12:48 PM  Result Value Ref Range Status   MRSA by PCR POSITIVE (A) NEGATIVE Final    Comment:        The GeneXpert MRSA Assay (FDA approved for NASAL specimens only), is one component of a comprehensive MRSA colonization surveillance program. It is not intended to diagnose MRSA infection nor to guide or monitor treatment for MRSA infections.     Coagulation Studies: No results for input(s): LABPROT, INR in the last 72 hours.  Urinalysis:  Recent Labs  12/21/14 2136  COLORURINE YELLOW  LABSPEC 1.014  PHURINE 8.0  GLUCOSEU NEGATIVE  HGBUR TRACE*  BILIRUBINUR NEGATIVE  KETONESUR NEGATIVE  PROTEINUR >300*  UROBILINOGEN 0.2  NITRITE NEGATIVE  LEUKOCYTESUR MODERATE*      Imaging: Ct Head Wo Contrast  12/21/2014   CLINICAL DATA:  Headache.  Hypertension  EXAM: CT HEAD WITHOUT CONTRAST  TECHNIQUE: Contiguous axial images were obtained from the base of the skull through the vertex without intravenous contrast.  COMPARISON:  CT head 04/13/2014  FINDINGS: Mild to moderate atrophy.  Negative for hydrocephalus  Chronic microvascular ischemic changes in the white matter similar to  the prior study  Negative for acute infarct.  Negative for hemorrhage or mass lesion.  Atherosclerotic calcification cavernous carotid bilaterally.  Calvarium intact.  IMPRESSION: Atrophy and chronic microvascular ischemia.  No acute abnormality.   Electronically Signed   By: Franchot Gallo M.D.   On: 12/21/2014 21:20   Dg Chest Port 1 View  12/21/2014   CLINICAL DATA:  Intermittent shortness of breath for 3-4 days.  EXAM: PORTABLE CHEST - 1 VIEW  COMPARISON:  CT of the chest 07/05/2013  FINDINGS: The heart is mildly enlarged. There are no focal consolidations or pleural effusions. There is mild pulmonary vascular congestion.  IMPRESSION: Cardiomegaly and mild vascular congestion.    Electronically Signed   By: Nolon Nations M.D.   On: 12/21/2014 22:09     Medications:     . allopurinol  300 mg Oral Daily  . amLODipine  10 mg Oral Daily  . aspirin EC  325 mg Oral Daily  . carvedilol  6.25 mg Oral BID WC  . Chlorhexidine Gluconate Cloth  6 each Topical Q0600  . Darunavir Ethanolate  800 mg Oral Q breakfast  . ferric gluconate (FERRLECIT/NULECIT) IV  125 mg Intravenous Q T,Th,Sa-HD  . folic acid  1 mg Oral Daily  . gabapentin  100 mg Oral TID  . heparin  5,000 Units Subcutaneous 3 times per day  . hydrALAZINE  75 mg Oral 3 times per day  . lamiVUDine  50 mg Oral Daily  . lanthanum  1,000 mg Oral TID WC  . levothyroxine  50 mcg Oral QAC breakfast  . multivitamin with minerals  1 tablet Oral Daily  . mupirocin ointment  1 application Nasal BID  . raltegravir  400 mg Oral BID  . ritonavir  100 mg Oral Q breakfast  . sodium chloride  3 mL Intravenous Q12H  . sodium chloride  3 mL Intravenous Q12H  . thiamine  100 mg Oral Daily  . zidovudine  300 mg Oral Daily   sodium chloride, sodium chloride, sodium chloride, acetaminophen **OR** acetaminophen, calcium carbonate (dosed in mg elemental calcium), camphor-menthol **AND** hydrOXYzine, feeding supplement (NEPRO CARB STEADY), feeding supplement (NEPRO CARB STEADY), heparin, lidocaine (PF), lidocaine-prilocaine, ondansetron **OR** ondansetron (ZOFRAN) IV, oxyCODONE-acetaminophen, pentafluoroprop-tetrafluoroeth, sodium chloride  Assessment/ Plan:  1. Hypertensive emergency - improved 2. ESRD - HD on TTS @ [place], K 4. 3. Anemia - Hgb 10. 4. Metabolic bone disease - Calcitriol 0.75 mcg, Fosrenol 1 g with meals. 5. Nutrition - Alb 3.4, renal carb-mod diet, vitamin. 6. HIV - antivirals, followed by Dr. Baxter Flattery. 7. Ventral hernia repair - 2/1 by Dr. Excell Seltzer, healing slowly, now only umbilical wound. 8. Hx Fournier's gangrene - in 02/2012, required [place] procedure with diverting sigmoid colostomy, reversed  02/21/13.   Dialysis in AM   LOS: 2 Chalet Kerwin W @TODAY @11 :21 AM

## 2014-12-23 NOTE — Progress Notes (Signed)
*  PRELIMINARY RESULTS* Echocardiogram 2D Echocardiogram has been performed.  Leavy Cella 12/23/2014, 3:34 PM

## 2014-12-23 NOTE — Progress Notes (Signed)
[facility] PROGRESS NOTE  [name]. MAU:[id number] DOB: [birth year] DOA: 12/21/2014 PCP: Blanchie Serve, MD  Brief narrative 64 y/o male with ESRD on HD ( Tu, th, sat; sees Dr Moshe Cipro), HTN, HIV on ART, hep C, Afib not on anticoagulation, Gout, hypothyroidism, anemia of CKD, presented with hypertensive urgency. Pt reports high BP for past 1 week with SBP >200 on the day of admission. Reports some shortness of breath for past 2 weeks and headache for past few days.. It appears that patient has had elevated blood pressure in the past and possibly not adherent with his medication. In the ED blood pressure was 201/95 mmHg. Patient started on nitroglycerin drip and admitted to stepdown unit.   Assessment/Plan: Hypertensive urgency -Initially improved on nitroglycerin drip.  -Resumed Coreg with added amlodipine 10 mg and hydralazine increased to 75 mg 3 times a day -Undergoing scheduled HD, last on 4/11 -Patient counseled on dietary adherence and medication compliance. -2D echo pending  End-stage renal disease on hemodialysis -Followed by Nephrology -Continue calcium carbonate.  HIV Continue ART. Follows with Dr. Drucilla Schmidt  Chronic Hep c  treated.  Anemia of chronic disease Continue ferric gluconate IV with dialysis   Hypothyroidism Continue Synthroid  S/p ventral incision hernia with mesh -Has a small abdominal wall opening with dressing applied.  -Pt originally scheduled for 1routine follow up with Dr Excell Seltzer as outpt 4/13 at 1:45pm. Rescheduled for 4/18 at 2:45pm -Consulted WOC for dressing changes. Discussed with staff of Dr. Excell Seltzer, who agrees with this plan  elevated troponin -Nonspecific with underlying ESRD.  -Troponin peaked at 0.04.  -Remains asymptomatic.   Afib -rate controlled.  -on full dose ASA and coreg.  -Not considered candidate for anticoagulation per Dr [name] due to his co morbidities.  DVT prophylaxis -Heparin subQ  Diet: Renal/ 2gm  Na  Code Status: full code Family Communication: none at bedside Disposition Plan: Possible home in 24-48hrs   Consultants:  renal  Procedures:  HD  Antibiotics:  none  HPI/Subjective: Feels better today. Wants to walk around  Objective: Filed Vitals:   12/23/14 0756  BP: 190/100  Pulse: 70  Temp: 97.6 F (36.4 C)  Resp: 7    Intake/Output Summary (Last 24 hours) at 12/23/14 0901 Last data filed at 12/22/14 2030  Gross per 24 hour  Intake    350 ml  Output   3500 ml  Net  -3150 ml   Filed Weights   12/22/14 1237 12/23/14 0500  Weight: 95.9 kg (211 lb 6.7 oz) 96.3 kg (212 lb 4.9 oz)    Exam:   General:  Elderly male in no acute distress, laying in bed  Chest: Clear to auscultation bilaterally, no wheezing  CVS: S1 and S2 irregular, no murmurs  GI: dressing over  Small abdominal  wall wound, no discharge,  Soft, nondistended, nontender, +bowel sounds  Musculoskeletal: Warm, trace edema, rt UE AV graft  CNS: alert and oreinted  Data Reviewed: Basic Metabolic Panel:  Recent Labs Lab 12/21/14 2021 12/22/14 0229 12/23/14 0319  NA 136 135 134*  K 4.2 4.0 4.1  CL 100 100 100  CO2 27 25 23   GLUCOSE 93 104* 85  BUN 20 22 15   CREATININE 7.61* 7.80*  7.84* 5.90*  CALCIUM 9.2 9.2 9.6   Liver Function Tests:  Recent Labs Lab 12/21/14 2021 12/22/14 0229  AST 13 16  ALT 8 9  ALKPHOS 60 61  BILITOT 0.7 0.6  PROT 6.9 7.1  ALBUMIN 3.4* 3.4*  No results for input(s): LIPASE, AMYLASE in the last 168 hours. No results for input(s): AMMONIA in the last 168 hours. CBC:  Recent Labs Lab 12/21/14 2021 12/22/14 0229 12/22/14 1510  WBC 4.6 5.0 4.3  NEUTROABS 2.6  --   --   HGB 10.0* 10.1* 10.0*  HCT 32.5* 31.9* 32.0*  MCV 105.2* 106.0* 104.9*  PLT 152 156 151   Cardiac Enzymes:  Recent Labs Lab 12/21/14 2021 12/22/14 0229 12/22/14 0810 12/22/14 1510  TROPONINI 0.04* 0.04* 0.04* 0.03   BNP (last 3 results) No results for  input(s): BNP in the last 8760 hours.  ProBNP (last 3 results) No results for input(s): PROBNP in the last 8760 hours.  CBG:  Recent Labs Lab 12/22/14 0532  GLUCAP 91    Recent Results (from the past 240 hour(s))  MRSA PCR Screening     Status: Abnormal   Collection Time: 12/22/14 12:48 PM  Result Value Ref Range Status   MRSA by PCR POSITIVE (A) NEGATIVE Final    Comment:        The GeneXpert MRSA Assay (FDA approved for NASAL specimens only), is one component of a comprehensive MRSA colonization surveillance program. It is not intended to diagnose MRSA infection nor to guide or monitor treatment for MRSA infections.      Studies: Ct Head Wo Contrast  12/21/2014   CLINICAL DATA:  Headache.  Hypertension  EXAM: CT HEAD WITHOUT CONTRAST  TECHNIQUE: Contiguous axial images were obtained from the base of the skull through the vertex without intravenous contrast.  COMPARISON:  CT head 04/13/2014  FINDINGS: Mild to moderate atrophy.  Negative for hydrocephalus  Chronic microvascular ischemic changes in the white matter similar to the prior study  Negative for acute infarct.  Negative for hemorrhage or mass lesion.  Atherosclerotic calcification cavernous carotid bilaterally.  Calvarium intact.  IMPRESSION: Atrophy and chronic microvascular ischemia.  No acute abnormality.   Electronically Signed   By: Franchot Gallo M.D.   On: 12/21/2014 21:20   Dg Chest Port 1 View  12/21/2014   CLINICAL DATA:  Intermittent shortness of breath for 3-4 days.  EXAM: PORTABLE CHEST - 1 VIEW  COMPARISON:  CT of the chest 07/05/2013  FINDINGS: The heart is mildly enlarged. There are no focal consolidations or pleural effusions. There is mild pulmonary vascular congestion.  IMPRESSION: Cardiomegaly and mild vascular congestion.   Electronically Signed   By: Nolon Nations M.D.   On: 12/21/2014 22:09    Scheduled Meds: . allopurinol  300 mg Oral Daily  . amLODipine  10 mg Oral Daily  . aspirin EC   325 mg Oral Daily  . carvedilol  6.25 mg Oral BID WC  . Chlorhexidine Gluconate Cloth  6 each Topical Q0600  . Darunavir Ethanolate  800 mg Oral Q breakfast  . ferric gluconate (FERRLECIT/NULECIT) IV  125 mg Intravenous Q T,Th,Sa-HD  . folic acid  1 mg Oral Daily  . gabapentin  100 mg Oral TID  . heparin  5,000 Units Subcutaneous 3 times per day  . hydrALAZINE  75 mg Oral 3 times per day  . lamiVUDine  50 mg Oral Daily  . lanthanum  1,000 mg Oral TID WC  . levothyroxine  50 mcg Oral QAC breakfast  . multivitamin with minerals  1 tablet Oral Daily  . mupirocin ointment  1 application Nasal BID  . raltegravir  400 mg Oral BID  . ritonavir  100 mg Oral Q breakfast  . sodium chloride  3 mL Intravenous Q12H  . sodium chloride  3 mL Intravenous Q12H  . thiamine  100 mg Oral Daily  . zidovudine  300 mg Oral Daily   Continuous Infusions:    Reann Dobias K  [facility] Pager [phone]. If 7PM-7AM, please contact night-coverage at [email or web address], password [facility] 12/23/2014, 9:01 AM  LOS: 2 days

## 2014-12-23 NOTE — Consult Note (Addendum)
WOC wound consult note Reason for Consult: Consult requested for abd wound.  Pt was followed by [facility] team during previous admission for surgery to this site and was due for a follow-up apt with them in the office this week.  He has home health assistance with dressing changes prior to admission. Pt states wound has greatly decreased in depth; he is very well-informed regarding topical treatment. Wound type: Chronic full thickness Measurement: .3X.3X2cm, tunneling occurs at 6:00 o'clock. Wound bed: Unable to visualize wound bed related to narrow opening. Drainage (amount, consistency, odor) Mod amt dark brown drainage on previous dressing with some odor. Periwound: Intact skin surrounding site. Dressing procedure/placement/frequency: Continue present plan of care with Iodoform packing using swab to fill tunneling area, then cover with ABD pad. Pt can resume follow-up with [facility] team after discharge. Discussed plan of care with patient and he verbalizes understanding. Please re-consult if further assistance is needed.  Thank-you,  Julien Girt MSN, [place], [place], [place], [place]

## 2014-12-24 DIAGNOSIS — D509 Iron deficiency anemia, unspecified: Secondary | ICD-10-CM | POA: Diagnosis not present

## 2014-12-24 DIAGNOSIS — N2581 Secondary hyperparathyroidism of renal origin: Secondary | ICD-10-CM | POA: Diagnosis not present

## 2014-12-24 DIAGNOSIS — N186 End stage renal disease: Secondary | ICD-10-CM | POA: Diagnosis not present

## 2014-12-24 DIAGNOSIS — D631 Anemia in chronic kidney disease: Secondary | ICD-10-CM | POA: Diagnosis not present

## 2014-12-24 LAB — GLUCOSE, CAPILLARY: Glucose-Capillary: 90 mg/dL (ref 70–99)

## 2014-12-24 MED ORDER — HYDRALAZINE HCL 25 MG PO TABS: 75.0000 mg | ORAL_TABLET | Freq: Three times a day (TID) | ORAL | Status: DC

## 2014-12-24 NOTE — Progress Notes (Signed)
12/24/2014 Patient was schedule for hemodialysis today but refuse to go. Patient told RN he made an appointment for the outpatient dialysis center for tomorrow. Hemodialysis was made aware, [facility] ([place]) nephrology is also aware and  Dr Wyline Copas was notified. [facility] RN.

## 2014-12-24 NOTE — Care Management Note (Signed)
    Page 1 of 1   12/24/2014     11:37:57 AM CARE MANAGEMENT NOTE 12/24/2014  Patient:  Bryan Gilmore, Bryan Gilmore   Account Number:  [id number]  Date Initiated:  12/24/2014  Documentation initiated by:  Elissa Hefty  Subjective/Objective Assessment:   adm w htn urgency     Action/Plan:   lives alone, act w caresouth   Anticipated DC Date:     Anticipated DC Plan:  [place]         [facility] Choice  Resumption Of Svcs/PTA Provider   Choice offered to / List presented to:          [facility] arranged  HH-1 RN      [place]   Status of service:   Medicare Important Message given?  YES (If response is "NO", the following Medicare IM given date fields will be blank) Date Medicare IM given:  12/24/2014 Medicare IM given by:  Elissa Hefty Date Additional Medicare IM given:   Additional Medicare IM given by:    Discharge Disposition:  [place]  Per UR Regulation:  Reviewed for med. necessity/level of care/duration of stay  If discussed at [place] of Stay Meetings, dates discussed:    Comments:

## 2014-12-24 NOTE — Progress Notes (Signed)
12/24/2014 Patient was discharge from the hospital at 1432. Rn did go over patient paperwork, medications, appointment that need to be schedule and patient was made aware of upcoming appointments. Patient was up walking and discharge from unit via wheel chair. He stated he will be taking a taxi home. [facility] RN.

## 2014-12-24 NOTE — Discharge Summary (Signed)
Physician Discharge Summary  Bryan Gilmore. ZOX:[id number] DOB: [birth year] DOA: 12/21/2014  PCP: Blanchie Serve, MD  Admit date: 12/21/2014 Discharge date: 12/24/2014  Time spent: 20 minutes  Recommendations for Outpatient Follow-up:  1. Follow up with PCP in 1-2 weeks 2. Follow up with Dr,. Hoxworth as scheduled 3. Follow up with scheduled HD  Discharge Diagnoses:  Principal Problem:   Hypertensive urgency Active Problems:   Human immunodeficiency virus (HIV) disease   ESRD (end stage renal disease)   Anemia of chronic renal failure   Atrial fibrillation, permanent   Hypothyroid   Discharge Condition: Improved  Diet recommendation: Heart healthy, renal  Filed Weights   12/22/14 1237 12/23/14 0500 12/24/14 0500  Weight: 95.9 kg (211 lb 6.7 oz) 96.3 kg (212 lb 4.9 oz) 97.8 kg (215 lb 9.8 oz)    History of present illness:  Please see admit h and p from 4/11 for details. Briefly, Bryan Gilmore presented with and was admitted for malignant hypertension.  Hospital Course:  Hypertensive urgency -Initially improved on nitroglycerin drip.  -Resumed Coreg with added amlodipine 10 mg and hydralazine increased to 75 mg 3 times a day -BP now much improved -Undergoing scheduled HD, last on 4/11 -Patient counseled on dietary adherence and medication compliance. -2D echo with normal LVEF with no WMA  End-stage renal disease on hemodialysis -Followed by Nephrology -Continue calcium carbonate. -Bryan Gilmore refused 4/14 scheduled HD, stating he had to attend a funeral. Discussed with Nephrology who cleared Bryan Gilmore for discharge  HIV Continue ART. Follows with Dr. Wendie Agreste  Chronic Hep c treated.  Anemia of chronic disease Continue ferric gluconate IV with dialysis   Hypothyroidism Continue Synthroid  S/p ventral incision hernia with mesh -Has a small abdominal wall opening with dressing applied.  -Bryan Gilmore originally scheduled for routine follow up with Dr Excell Seltzer as outpt 4/13 at 1:45pm.  Rescheduled for 4/18 at 2:45pm -Consulted WOC for dressing changes. Discussed with staff of Dr. Excell Seltzer, who agrees with this plan  elevated troponin -Nonspecific with underlying ESRD.  -Troponin peaked at 0.04.  -Remains asymptomatic.   Afib -rate controlled.  -on full dose ASA and coreg.  -Not considered candidate for anticoagulation per Dr [name] due to his co morbidities.  DVT prophylaxis -Heparin subQ  Diet: Renal/ 2gm Na  Consultations:  Nephrology  Discharge Exam: Filed Vitals:   12/24/14 0400 12/24/14 0500 12/24/14 0750 12/24/14 1241  BP: 122/53  167/90 162/82  Pulse:   61 62  Temp: 97.3 F (36.3 C)  97.7 F (36.5 C) 97.9 F (36.6 C)  TempSrc: Oral  Oral Oral  Resp: 21  18 27   Height:      Weight:  97.8 kg (215 lb 9.8 oz)    SpO2: 98%  100% 97%    General: Awake, in nad Cardiovascular: regular, s1, s2 Respiratory: normal resp effort, no wheezing  Discharge Instructions     Medication List    TAKE these medications        allopurinol 300 MG tablet  Commonly known as:  ZYLOPRIM  TAKE ONE TABLET BY MOUTH ONCE DAILY FOR GOUT     aspirin EC 325 MG tablet  Take 1 tablet (325 mg total) by mouth daily.     carvedilol 6.25 MG tablet  Commonly known as:  COREG  Take 1 tablet (6.25 mg total) by mouth 2 (two) times daily.     Darunavir Ethanolate 800 MG tablet  Commonly known as:  PREZISTA  Take 1 tablet (800 mg total) by mouth  daily with breakfast.     gabapentin 100 MG capsule  Commonly known as:  NEURONTIN  TAKE ONE CAPSULE BY MOUTH THREE TIMES DAILY     hydrALAZINE 25 MG tablet  Commonly known as:  APRESOLINE  Take 3 tablets (75 mg total) by mouth every 8 (eight) hours.     lamivudine 100 MG tablet  Commonly known as:  EPIVIR  Take 0.5 tablets (50 mg total) by mouth daily.     lanthanum 1000 MG chewable tablet  Commonly known as:  FOSRENOL  Chew 1,000 mg by mouth 3 (three) times daily with meals.     levothyroxine 50 MCG tablet   Commonly known as:  SYNTHROID, LEVOTHROID  Take 1 tablet (50 mcg total) by mouth daily.     oxyCODONE-acetaminophen 5-325 MG per tablet  Commonly known as:  PERCOCET/ROXICET  Take 1 tablet by mouth every 6 (six) hours as needed for moderate pain.     raltegravir 400 MG tablet  Commonly known as:  ISENTRESS  TAKE ONE TABLET BY MOUTH BY MOUTH TWICE DAILY     ritonavir 100 MG Tabs tablet  Commonly known as:  NORVIR  Take 1 tablet (100 mg total) by mouth daily.     zidovudine 300 MG tablet  Commonly known as:  RETROVIR  Take 1 tablet (300 mg total) by mouth daily.       No Known Allergies Follow-up Information    Follow up with Blanchie Serve, MD. Schedule an appointment as soon as possible for a visit in 1 week.   Specialty:  Internal Medicine   Why:  Hospital follow up   Contact information:   [place] [address] [phone]       Follow up with [name], MD On 12/28/2014.   Specialty:  General Surgery   Why:  at 2:45pm   Contact information:   [place] [address] [phone]        The results of significant diagnostics from this hospitalization (including imaging, microbiology, ancillary and laboratory) are listed below for reference.    Significant Diagnostic Studies: Ct Head Wo Contrast  12/21/2014   CLINICAL DATA:  Headache.  Hypertension  EXAM: CT HEAD WITHOUT CONTRAST  TECHNIQUE: Contiguous axial images were obtained from the base of the skull through the vertex without intravenous contrast.  COMPARISON:  CT head 04/13/2014  FINDINGS: Mild to moderate atrophy.  Negative for hydrocephalus  Chronic microvascular ischemic changes in the white matter similar to the prior study  Negative for acute infarct.  Negative for hemorrhage or mass lesion.  Atherosclerotic calcification cavernous carotid bilaterally.  Calvarium intact.  IMPRESSION: Atrophy and chronic microvascular ischemia.  No acute abnormality.    Electronically Signed   By: Franchot Gallo M.D.   On: 12/21/2014 21:20   Dg Chest Port 1 View  12/21/2014   CLINICAL DATA:  Intermittent shortness of breath for 3-4 days.  EXAM: PORTABLE CHEST - 1 VIEW  COMPARISON:  CT of the chest 07/05/2013  FINDINGS: The heart is mildly enlarged. There are no focal consolidations or pleural effusions. There is mild pulmonary vascular congestion.  IMPRESSION: Cardiomegaly and mild vascular congestion.   Electronically Signed   By: Nolon Nations M.D.   On: 12/21/2014 22:09    Microbiology: Recent Results (from the past 240 hour(s))  MRSA PCR Screening     Status: Abnormal   Collection Time: 12/22/14 12:48 PM  Result Value Ref Range Status  MRSA by PCR POSITIVE (A) NEGATIVE Final    Comment:        The GeneXpert MRSA Assay (FDA approved for NASAL specimens only), is one component of a comprehensive MRSA colonization surveillance program. It is not intended to diagnose MRSA infection nor to guide or monitor treatment for MRSA infections.      Labs: Basic Metabolic Panel:  Recent Labs Lab 12/21/14 2021 12/22/14 0229 12/23/14 0319  NA 136 135 134*  K 4.2 4.0 4.1  CL 100 100 100  CO2 27 25 23   GLUCOSE 93 104* 85  BUN 20 22 15   CREATININE 7.61* 7.80*  7.84* 5.90*  CALCIUM 9.2 9.2 9.6   Liver Function Tests:  Recent Labs Lab 12/21/14 2021 12/22/14 0229  AST 13 16  ALT 8 9  ALKPHOS 60 61  BILITOT 0.7 0.6  PROT 6.9 7.1  ALBUMIN 3.4* 3.4*   No results for input(s): LIPASE, AMYLASE in the last 168 hours. No results for input(s): AMMONIA in the last 168 hours. CBC:  Recent Labs Lab 12/21/14 2021 12/22/14 0229 12/22/14 1510  WBC 4.6 5.0 4.3  NEUTROABS 2.6  --   --   HGB 10.0* 10.1* 10.0*  HCT 32.5* 31.9* 32.0*  MCV 105.2* 106.0* 104.9*  PLT 152 156 151   Cardiac Enzymes:  Recent Labs Lab 12/21/14 2021 12/22/14 0229 12/22/14 0810 12/22/14 1510  TROPONINI 0.04* 0.04* 0.04* 0.03   BNP: BNP (last 3 results) No  results for input(s): BNP in the last 8760 hours.  ProBNP (last 3 results) No results for input(s): PROBNP in the last 8760 hours.  CBG:  Recent Labs Lab 12/22/14 0532 12/23/14 0754 12/24/14 0749  GLUCAP 91 100* 90    Signed:  Naftoli Penny K  [facility] 12/24/2014, 1:19 PM

## 2014-12-24 NOTE — Progress Notes (Signed)
[facility] ROUNDING NOTE   Subjective:   Interval History: no complaints  Objective:  Vital signs in last 24 hours:  Temp:  [97.3 F (36.3 C)-97.9 F (36.6 C)] 97.7 F (36.5 C) (04/14 0750) Pulse Rate:  [38-61] 61 (04/14 0750) Resp:  [13-21] 18 (04/14 0750) BP: (122-181)/(53-90) 167/90 mmHg (04/14 0750) SpO2:  [98 %-100 %] 100 % (04/14 0750) Weight:  [97.8 kg (215 lb 9.8 oz)] 97.8 kg (215 lb 9.8 oz) (04/14 0500)  Weight change: 1.9 kg (4 lb 3 oz) Filed Weights   12/22/14 1237 12/23/14 0500 12/24/14 0500  Weight: 95.9 kg (211 lb 6.7 oz) 96.3 kg (212 lb 4.9 oz) 97.8 kg (215 lb 9.8 oz)    Intake/Output: I/O last 3 completed shifts: In: 240 [P.O.:240] Out: 750 [Urine:750]   Intake/Output this shift:  Total I/O In: 240 [P.O.:240] Out: -   CVS- RRR RS- CTA ABD- BS present soft non-distended EXT- no edema   Basic Metabolic Panel:  Recent Labs Lab 12/21/14 2021 12/22/14 0229 12/23/14 0319  NA 136 135 134*  K 4.2 4.0 4.1  CL 100 100 100  CO2 27 25 23   GLUCOSE 93 104* 85  BUN 20 22 15   CREATININE 7.61* 7.80*  7.84* 5.90*  CALCIUM 9.2 9.2 9.6    Liver Function Tests:  Recent Labs Lab 12/21/14 2021 12/22/14 0229  AST 13 16  ALT 8 9  ALKPHOS 60 61  BILITOT 0.7 0.6  PROT 6.9 7.1  ALBUMIN 3.4* 3.4*   No results for input(s): LIPASE, AMYLASE in the last 168 hours. No results for input(s): AMMONIA in the last 168 hours.  CBC:  Recent Labs Lab 12/21/14 2021 12/22/14 0229 12/22/14 1510  WBC 4.6 5.0 4.3  NEUTROABS 2.6  --   --   HGB 10.0* 10.1* 10.0*  HCT 32.5* 31.9* 32.0*  MCV 105.2* 106.0* 104.9*  PLT 152 156 151    Cardiac Enzymes:  Recent Labs Lab 12/21/14 2021 12/22/14 0229 12/22/14 0810 12/22/14 1510  TROPONINI 0.04* 0.04* 0.04* 0.03    BNP: Invalid input(s): POCBNP  CBG:  Recent Labs Lab 12/22/14 0532 12/23/14 0754 12/24/14 0749  GLUCAP 91 100* 59    Microbiology: Results for orders placed or  performed during the hospital encounter of 12/21/14  MRSA PCR Screening     Status: Abnormal   Collection Time: 12/22/14 12:48 PM  Result Value Ref Range Status   MRSA by PCR POSITIVE (A) NEGATIVE Final    Comment:        The GeneXpert MRSA Assay (FDA approved for NASAL specimens only), is one component of a comprehensive MRSA colonization surveillance program. It is not intended to diagnose MRSA infection nor to guide or monitor treatment for MRSA infections.     Coagulation Studies: No results for input(s): LABPROT, INR in the last 72 hours.  Urinalysis:  Recent Labs  12/21/14 2136  COLORURINE YELLOW  LABSPEC 1.014  PHURINE 8.0  GLUCOSEU NEGATIVE  HGBUR TRACE*  BILIRUBINUR NEGATIVE  KETONESUR NEGATIVE  PROTEINUR >300*  UROBILINOGEN 0.2  NITRITE NEGATIVE  LEUKOCYTESUR MODERATE*      Imaging: No results found.   Medications:     . allopurinol  300 mg Oral Daily  . amLODipine  10 mg Oral Daily  . aspirin EC  325 mg Oral Daily  . carvedilol  6.25 mg Oral BID WC  . Chlorhexidine Gluconate Cloth  6 each Topical Q0600  . Darunavir Ethanolate  800 mg Oral Q breakfast  .  ferric gluconate (FERRLECIT/NULECIT) IV  125 mg Intravenous Q T,Th,Sa-HD  . folic acid  1 mg Oral Daily  . gabapentin  100 mg Oral TID  . heparin  5,000 Units Subcutaneous 3 times per day  . hydrALAZINE  75 mg Oral 3 times per day  . lamiVUDine  50 mg Oral Daily  . lanthanum  1,000 mg Oral TID WC  . levothyroxine  50 mcg Oral QAC breakfast  . multivitamin with minerals  1 tablet Oral Daily  . mupirocin ointment  1 application Nasal BID  . raltegravir  400 mg Oral BID  . ritonavir  100 mg Oral Q breakfast  . sodium chloride  3 mL Intravenous Q12H  . sodium chloride  3 mL Intravenous Q12H  . thiamine  100 mg Oral Daily  . zidovudine  300 mg Oral Daily   sodium chloride, sodium chloride, sodium chloride, acetaminophen **OR** acetaminophen, calcium carbonate (dosed in mg elemental calcium),  camphor-menthol **AND** hydrOXYzine, feeding supplement (NEPRO CARB STEADY), feeding supplement (NEPRO CARB STEADY), heparin, lidocaine (PF), lidocaine-prilocaine, ondansetron **OR** ondansetron (ZOFRAN) IV, oxyCODONE-acetaminophen, pentafluoroprop-tetrafluoroeth, sodium chloride  Assessment/ Plan:  1. Hypertensive emergency - improved 2. ESRD - HD on TTS @ [place], K 4. 3. Anemia - Hgb 10. 4. Metabolic bone disease - Calcitriol 0.75 mcg, Fosrenol 1 g with meals. 5. Nutrition - Alb 3.4, renal carb-mod diet, vitamin. 6. HIV - antivirals, followed by Dr. Baxter Flattery. 7. Ventral hernia repair - 2/1 by Dr. Excell Seltzer, healing slowly, now only umbilical wound. 8. Hx Fournier's gangrene - in 02/2012, required [place] procedure with diverting sigmoid colostomy, reversed 02/21/13.   Patient scheduled for dialysis   LOS: 3 Jurgen Groeneveld W @TODAY @11 :55 AM

## 2014-12-24 NOTE — Progress Notes (Signed)
  Subjective: The patient has been admitted for hypertensive urgency.  No abdominal complaints.   Objective: Vital signs in last 24 hours: Temp:  [97.3 F (36.3 C)-97.9 F (36.6 C)] 97.7 F (36.5 C) (04/14 0750) Pulse Rate:  [38-61] 61 (04/14 0750) Resp:  [13-21] 18 (04/14 0750) BP: (122-181)/(53-90) 167/90 mmHg (04/14 0750) SpO2:  [98 %-100 %] 100 % (04/14 0750) Weight:  [97.8 kg (215 lb 9.8 oz)] 97.8 kg (215 lb 9.8 oz) (04/14 0500) Last BM Date: 12/23/14  Intake/Output from previous day: 04/13 0701 - 04/14 0700 In: -  Out: 750 [Urine:750] Intake/Output this shift: Total I/O In: 240 [P.O.:240] Out: -   General appearance: alert, cooperative and no distress GI: +bs, abdomen is soft and non tender.  there is a pinpoint opening that is 2cm deep, no tracking or undermining and is clean.  Wound was repacked.   Lab Results:   Recent Labs  12/22/14 0229 12/22/14 1510  WBC 5.0 4.3  HGB 10.1* 10.0*  HCT 31.9* 32.0*  PLT 156 151   BMET  Recent Labs  12/22/14 0229 12/23/14 0319  NA 135 134*  K 4.0 4.1  CL 100 100  CO2 25 23  GLUCOSE 104* 85  BUN 22 15  CREATININE 7.80*  7.84* 5.90*  CALCIUM 9.2 9.6   PT/INR No results for input(s): LABPROT, INR in the last 72 hours. ABG No results for input(s): PHART, HCO3 in the last 72 hours.  Invalid input(s): PCO2, PO2  Studies/Results: No results found.  Anti-infectives: Anti-infectives    Start     Dose/Rate Route Frequency Ordered Stop   12/23/14 0700  Darunavir Ethanolate (PREZISTA) tablet 800 mg     800 mg Oral Daily with breakfast 12/22/14 1241     12/22/14 1000  lamiVUDine (EPIVIR) 10 MG/ML solution 50 mg     50 mg Oral Daily 12/22/14 0212     12/22/14 1000  raltegravir (ISENTRESS) tablet 400 mg     400 mg Oral 2 times daily 12/22/14 0212     12/22/14 1000  zidovudine (RETROVIR) capsule 300 mg     300 mg Oral Daily 12/22/14 0212     12/22/14 0800  ritonavir (NORVIR) tablet 100 mg     100 mg Oral Daily  with breakfast 12/22/14 4656        Assessment/Plan: S/p ventral incisional hernia repair 10/12/14 by Dr. Excell Seltzer -wound looks excellent.  Continue with dressing changes daily, 1/2 inch iodoform.  He will need home health resumed at discharge.  Follow up with Dr. Excell Seltzer has been arranged.      LOS: 3 days    Lamees Gable ANP-BC 12/24/2014 12:06 PM

## 2014-12-25 ENCOUNTER — Ambulatory Visit
Admission: RE | Admit: 2014-12-25 | Discharge: 2014-12-25 | Disposition: A | Discharge status: Home / Self Care | Payer: Medicare Other | Source: Ambulatory Visit | Attending: Gastroenterology | Admitting: Gastroenterology

## 2014-12-25 DIAGNOSIS — D509 Iron deficiency anemia, unspecified: Secondary | ICD-10-CM | POA: Diagnosis not present

## 2014-12-25 DIAGNOSIS — K7469 Other cirrhosis of liver: Secondary | ICD-10-CM

## 2014-12-25 DIAGNOSIS — K7460 Unspecified cirrhosis of liver: Secondary | ICD-10-CM | POA: Diagnosis not present

## 2014-12-25 DIAGNOSIS — K828 Other specified diseases of gallbladder: Secondary | ICD-10-CM | POA: Diagnosis not present

## 2014-12-25 DIAGNOSIS — N186 End stage renal disease: Secondary | ICD-10-CM | POA: Diagnosis not present

## 2014-12-25 DIAGNOSIS — N2581 Secondary hyperparathyroidism of renal origin: Secondary | ICD-10-CM | POA: Diagnosis not present

## 2014-12-25 DIAGNOSIS — D631 Anemia in chronic kidney disease: Secondary | ICD-10-CM | POA: Diagnosis not present

## 2014-12-29 DIAGNOSIS — N186 End stage renal disease: Secondary | ICD-10-CM | POA: Diagnosis not present

## 2014-12-29 DIAGNOSIS — D509 Iron deficiency anemia, unspecified: Secondary | ICD-10-CM | POA: Diagnosis not present

## 2014-12-29 DIAGNOSIS — N2581 Secondary hyperparathyroidism of renal origin: Secondary | ICD-10-CM | POA: Diagnosis not present

## 2014-12-29 DIAGNOSIS — D631 Anemia in chronic kidney disease: Secondary | ICD-10-CM | POA: Diagnosis not present

## 2014-12-30 ENCOUNTER — Encounter: Payer: Self-pay | Admitting: Internal Medicine

## 2014-12-30 ENCOUNTER — Ambulatory Visit (INDEPENDENT_AMBULATORY_CARE_PROVIDER_SITE_OTHER): Payer: Medicare Other | Admitting: Internal Medicine

## 2014-12-30 VITALS — BP 138/86 | HR 62 | Temp 97.9°F | Ht 71.0 in | Wt 217.0 lb

## 2014-12-30 DIAGNOSIS — Z992 Dependence on renal dialysis: Secondary | ICD-10-CM | POA: Diagnosis not present

## 2014-12-30 DIAGNOSIS — B20 Human immunodeficiency virus [HIV] disease: Secondary | ICD-10-CM | POA: Diagnosis not present

## 2014-12-30 DIAGNOSIS — I10 Essential (primary) hypertension: Secondary | ICD-10-CM

## 2014-12-30 DIAGNOSIS — F17210 Nicotine dependence, cigarettes, uncomplicated: Secondary | ICD-10-CM | POA: Diagnosis not present

## 2014-12-30 DIAGNOSIS — I120 Hypertensive chronic kidney disease with stage 5 chronic kidney disease or end stage renal disease: Secondary | ICD-10-CM | POA: Diagnosis not present

## 2014-12-30 DIAGNOSIS — I4891 Unspecified atrial fibrillation: Secondary | ICD-10-CM

## 2014-12-30 DIAGNOSIS — N186 End stage renal disease: Secondary | ICD-10-CM

## 2014-12-30 DIAGNOSIS — Z21 Asymptomatic human immunodeficiency virus [HIV] infection status: Secondary | ICD-10-CM | POA: Diagnosis not present

## 2014-12-30 DIAGNOSIS — T814XXD Infection following a procedure, subsequent encounter: Secondary | ICD-10-CM | POA: Diagnosis not present

## 2014-12-30 NOTE — Progress Notes (Signed)
Patient ID: Bryan Macomber., male   DOB: 1951-01-01, 64 y.o.   MRN: [id number]    Facility  [facility]    Place of Service:   OFFICE    No Known Allergies  Chief Complaint  Patient presents with  . Hospitalization Follow-up    Hospital Follow up. Was Admitted on 12/21/14 for HTN    HPI:  Previously seen by Dr. Guinevere Ferrari and Sherrie Mustache Hospitalized 12/21/2014 through 12/24/2014 Principal diagnosis hypertensive urgency. Other diagnoses include HIV disease, ESRD, anemia of chronic renal failure, chronic atrial fibrillation, and hypothyroidism.  Patient was initially treated with a nitroglycerin drip and then Coreg was resumed with amlodipine 10 mg and hydralazine was increased from 75 mg 3 times daily. Blood pressure appeared to be controlled on these drugs.  Patient had his new medication prescriptions filled today left the hospital. He says he is doing well.  Medications: Patient's Medications  New Prescriptions   No medications on file  Previous Medications   ALLOPURINOL (ZYLOPRIM) 300 MG TABLET    TAKE ONE TABLET BY MOUTH ONCE DAILY FOR GOUT   ASPIRIN EC 325 MG TABLET    Take 1 tablet (325 mg total) by mouth daily.   CARVEDILOL (COREG) 6.25 MG TABLET    Take 1 tablet (6.25 mg total) by mouth 2 (two) times daily.   DARUNAVIR ETHANOLATE (PREZISTA) 800 MG TABLET    Take 1 tablet (800 mg total) by mouth daily with breakfast.   GABAPENTIN (NEURONTIN) 100 MG CAPSULE    TAKE ONE CAPSULE BY MOUTH THREE TIMES DAILY   HYDRALAZINE (APRESOLINE) 25 MG TABLET    Take 3 tablets (75 mg total) by mouth every 8 (eight) hours.   LAMIVUDINE (EPIVIR) 100 MG TABLET    Take 0.5 tablets (50 mg total) by mouth daily.   LANTHANUM (FOSRENOL) 1000 MG CHEWABLE TABLET    Chew 1,000 mg by mouth 3 (three) times daily with meals.    LEVOTHYROXINE (SYNTHROID, LEVOTHROID) 50 MCG TABLET    Take 1 tablet (50 mcg total) by mouth daily.   OXYCODONE-ACETAMINOPHEN (PERCOCET/ROXICET) 5-325 MG PER TABLET    Take 1 tablet by  mouth every 6 (six) hours as needed for moderate pain.   RALTEGRAVIR (ISENTRESS) 400 MG TABLET    TAKE ONE TABLET BY MOUTH BY MOUTH TWICE DAILY   RITONAVIR (NORVIR) 100 MG TABS TABLET    Take 1 tablet (100 mg total) by mouth daily.   ZIDOVUDINE (RETROVIR) 300 MG TABLET    Take 1 tablet (300 mg total) by mouth daily.  Modified Medications   No medications on file  Discontinued Medications   No medications on file     Review of Systems  Constitutional: Negative for activity change, appetite change, fatigue and unexpected weight change.  HENT: Negative for congestion and hearing loss.   Eyes: Negative.   Respiratory: Negative for cough and shortness of breath.   Cardiovascular: Negative for chest pain, palpitations and leg swelling.  Gastrointestinal: Negative for abdominal pain, diarrhea and constipation.  Genitourinary: Negative for difficulty urinating.       ESRD, on dialysis Tuesday, Thursday, Saturday   Musculoskeletal: Negative for myalgias and arthralgias.  Skin: Positive for wound (to abd). Negative for color change.  Neurological: Negative for dizziness, weakness and headaches.  Psychiatric/Behavioral: Negative for behavioral problems, confusion and agitation.    Filed Vitals:   12/30/14 1202  BP: 138/86  Pulse: 62  Temp: 97.9 F (36.6 C)  TempSrc: Oral  Height: '5\' 11"'  (1.803 m)  Weight: 217 lb (98.431 kg)   Body mass index is 30.28 kg/(m^2).  Physical Exam  Constitutional: He is oriented to person, place, and time. He appears well-developed and well-nourished. No distress.  HENT:  Head: Normocephalic and atraumatic.  Mouth/Throat: Oropharynx is clear and moist. No oropharyngeal exudate.  Eyes: Conjunctivae and EOM are normal. Pupils are equal, round, and reactive to light.  Neck: Normal range of motion. Neck supple.  Cardiovascular: Normal rate, regular rhythm and normal heart sounds.   Pulmonary/Chest: Effort normal and breath sounds normal.  Abdominal: Soft.  Bowel sounds are normal.  Small opening to abdomen, minimal drainage   Musculoskeletal: He exhibits no edema or tenderness.  Neurological: He is alert and oriented to person, place, and time.  Skin: Skin is warm and dry. He is not diaphoretic.  Psychiatric: He has a normal mood and affect.     Labs reviewed: Admission on 12/21/2014, Discharged on 12/24/2014  Component Date Value Ref Range Status  . WBC 12/21/2014 4.6  4.0 - 10.5 K/uL Final  . RBC 12/21/2014 3.09* 4.22 - 5.81 MIL/uL Final  . Hemoglobin 12/21/2014 10.0* 13.0 - 17.0 g/dL Final  . HCT 12/21/2014 32.5* 39.0 - 52.0 % Final  . MCV 12/21/2014 105.2* 78.0 - 100.0 fL Final  . MCH 12/21/2014 32.4  26.0 - 34.0 pg Final  . MCHC 12/21/2014 30.8  30.0 - 36.0 g/dL Final  . RDW 12/21/2014 15.4  11.5 - 15.5 % Final  . Platelets 12/21/2014 152  150 - 400 K/uL Final  . Neutrophils Relative % 12/21/2014 58  43 - 77 % Final  . Neutro Abs 12/21/2014 2.6  1.7 - 7.7 K/uL Final  . Lymphocytes Relative 12/21/2014 34  12 - 46 % Final  . Lymphs Abs 12/21/2014 1.6  0.7 - 4.0 K/uL Final  . Monocytes Relative 12/21/2014 6  3 - 12 % Final  . Monocytes Absolute 12/21/2014 0.3  0.1 - 1.0 K/uL Final  . Eosinophils Relative 12/21/2014 2  0 - 5 % Final  . Eosinophils Absolute 12/21/2014 0.1  0.0 - 0.7 K/uL Final  . Basophils Relative 12/21/2014 0  0 - 1 % Final  . Basophils Absolute 12/21/2014 0.0  0.0 - 0.1 K/uL Final  . Sodium 12/21/2014 136  135 - 145 mmol/L Final  . Potassium 12/21/2014 4.2  3.5 - 5.1 mmol/L Final  . Chloride 12/21/2014 100  96 - 112 mmol/L Final  . CO2 12/21/2014 27  19 - 32 mmol/L Final  . Glucose, Bld 12/21/2014 93  70 - 99 mg/dL Final  . BUN 12/21/2014 20  6 - 23 mg/dL Final  . Creatinine, Ser 12/21/2014 7.61* 0.50 - 1.35 mg/dL Final  . Calcium 12/21/2014 9.2  8.4 - 10.5 mg/dL Final  . Total Protein 12/21/2014 6.9  6.0 - 8.3 g/dL Final  . Albumin 12/21/2014 3.4* 3.5 - 5.2 g/dL Final  . AST 12/21/2014 13  0 - 37 U/L Final    . ALT 12/21/2014 8  0 - 53 U/L Final  . Alkaline Phosphatase 12/21/2014 60  39 - 117 U/L Final  . Total Bilirubin 12/21/2014 0.7  0.3 - 1.2 mg/dL Final  . GFR calc non Af Amer 12/21/2014 7* >90 mL/min Final  . GFR calc Af Amer 12/21/2014 8* >90 mL/min Final   Comment: (NOTE) The eGFR has been calculated using the CKD EPI equation. This calculation has not been validated in all clinical situations. eGFR's persistently <90 mL/min signify possible Chronic Kidney Disease.   Marland Kitchen  Anion gap 12/21/2014 9  5 - 15 Final  . Color, Urine 12/21/2014 YELLOW  YELLOW Final  . APPearance 12/21/2014 CLOUDY* CLEAR Final  . Specific Gravity, Urine 12/21/2014 1.014  1.005 - 1.030 Final  . pH 12/21/2014 8.0  5.0 - 8.0 Final  . Glucose, UA 12/21/2014 NEGATIVE  NEGATIVE mg/dL Final  . Hgb urine dipstick 12/21/2014 TRACE* NEGATIVE Final  . Bilirubin Urine 12/21/2014 NEGATIVE  NEGATIVE Final  . Ketones, ur 12/21/2014 NEGATIVE  NEGATIVE mg/dL Final  . Protein, ur 12/21/2014 >300* NEGATIVE mg/dL Final  . Urobilinogen, UA 12/21/2014 0.2  0.0 - 1.0 mg/dL Final  . Nitrite 12/21/2014 NEGATIVE  NEGATIVE Final  . Leukocytes, UA 12/21/2014 MODERATE* NEGATIVE Final  . Troponin I 12/21/2014 0.04* <0.031 ng/mL Final   Comment:        PERSISTENTLY INCREASED TROPONIN VALUES IN THE RANGE OF 0.04-0.49 ng/mL CAN BE SEEN IN:       -UNSTABLE ANGINA       -CONGESTIVE HEART FAILURE       -MYOCARDITIS       -CHEST TRAUMA       -ARRYHTHMIAS       -LATE PRESENTING MYOCARDIAL INFARCTION       -COPD   CLINICAL FOLLOW-UP RECOMMENDED.   Marland Kitchen Squamous Epithelial / LPF 12/21/2014 RARE  RARE Final  . WBC, UA 12/21/2014 11-20  <3 WBC/hpf Final  . RBC / HPF 12/21/2014 0-2  <3 RBC/hpf Final  . Bacteria, UA 12/21/2014 RARE  RARE Final  . Casts 12/21/2014 HYALINE CASTS* NEGATIVE Final  . WBC 12/22/2014 5.0  4.0 - 10.5 K/uL Final  . RBC 12/22/2014 3.01* 4.22 - 5.81 MIL/uL Final  . Hemoglobin 12/22/2014 10.1* 13.0 - 17.0 g/dL Final  .  HCT 12/22/2014 31.9* 39.0 - 52.0 % Final  . MCV 12/22/2014 106.0* 78.0 - 100.0 fL Final  . MCH 12/22/2014 33.6  26.0 - 34.0 pg Final  . MCHC 12/22/2014 31.7  30.0 - 36.0 g/dL Final  . RDW 12/22/2014 15.6* 11.5 - 15.5 % Final  . Platelets 12/22/2014 156  150 - 400 K/uL Final  . Creatinine, Ser 12/22/2014 7.80* 0.50 - 1.35 mg/dL Final  . GFR calc non Af Amer 12/22/2014 6* >90 mL/min Final  . GFR calc Af Amer 12/22/2014 8* >90 mL/min Final   Comment: (NOTE) The eGFR has been calculated using the CKD EPI equation. This calculation has not been validated in all clinical situations. eGFR's persistently <90 mL/min signify possible Chronic Kidney Disease.   Marland Kitchen TSH 12/22/2014 5.597* 0.350 - 4.500 uIU/mL Final  . Troponin I 12/22/2014 0.04* <0.031 ng/mL Final   Comment:        PERSISTENTLY INCREASED TROPONIN VALUES IN THE RANGE OF 0.04-0.49 ng/mL CAN BE SEEN IN:       -UNSTABLE ANGINA       -CONGESTIVE HEART FAILURE       -MYOCARDITIS       -CHEST TRAUMA       -ARRYHTHMIAS       -LATE PRESENTING MYOCARDIAL INFARCTION       -COPD   CLINICAL FOLLOW-UP RECOMMENDED.   Marland Kitchen Troponin I 12/22/2014 0.04* <0.031 ng/mL Final   Comment:        PERSISTENTLY INCREASED TROPONIN VALUES IN THE RANGE OF 0.04-0.49 ng/mL CAN BE SEEN IN:       -UNSTABLE ANGINA       -CONGESTIVE HEART FAILURE       -MYOCARDITIS       -CHEST TRAUMA       -  ARRYHTHMIAS       -LATE PRESENTING MYOCARDIAL INFARCTION       -COPD   CLINICAL FOLLOW-UP RECOMMENDED.   Marland Kitchen Troponin I 12/22/2014 0.03  <0.031 ng/mL Final   Comment:        NO INDICATION OF MYOCARDIAL INJURY.   . Hgb A1c MFr Bld 12/22/2014 4.7* 4.8 - 5.6 % Final   Comment: (NOTE)         Pre-diabetes: 5.7 - 6.4         Diabetes: >6.4         Glycemic control for adults with diabetes: <7.0   . Mean Plasma Glucose 12/22/2014 88   Final   Comment: (NOTE) Performed At: [facility] [place], [place] [id number] Lindon Romp MD  [id number]   . Sodium 12/22/2014 135  135 - 145 mmol/L Final  . Potassium 12/22/2014 4.0  3.5 - 5.1 mmol/L Final  . Chloride 12/22/2014 100  96 - 112 mmol/L Final  . CO2 12/22/2014 25  19 - 32 mmol/L Final  . Glucose, Bld 12/22/2014 104* 70 - 99 mg/dL Final  . BUN 12/22/2014 22  6 - 23 mg/dL Final  . Creatinine, Ser 12/22/2014 7.84* 0.50 - 1.35 mg/dL Final  . Calcium 12/22/2014 9.2  8.4 - 10.5 mg/dL Final  . Total Protein 12/22/2014 7.1  6.0 - 8.3 g/dL Final  . Albumin 12/22/2014 3.4* 3.5 - 5.2 g/dL Final  . AST 12/22/2014 16  0 - 37 U/L Final  . ALT 12/22/2014 9  0 - 53 U/L Final  . Alkaline Phosphatase 12/22/2014 61  39 - 117 U/L Final  . Total Bilirubin 12/22/2014 0.6  0.3 - 1.2 mg/dL Final  . GFR calc non Af Amer 12/22/2014 6* >90 mL/min Final  . GFR calc Af Amer 12/22/2014 7* >90 mL/min Final   Comment: (NOTE) The eGFR has been calculated using the CKD EPI equation. This calculation has not been validated in all clinical situations. eGFR's persistently <90 mL/min signify possible Chronic Kidney Disease.   . Anion gap 12/22/2014 10  5 - 15 Final  . Troponin i, poc 12/22/2014 0.02  0.00 - 0.08 ng/mL Final  . Comment 3 12/22/2014          Final   Comment: Due to the release kinetics of cTnI, a negative result within the first hours of the onset of symptoms does not rule out myocardial infarction with certainty. If myocardial infarction is still suspected, repeat the test at appropriate intervals.   . Glucose-Capillary 12/22/2014 91  70 - 99 mg/dL Final  . WBC 12/22/2014 4.3  4.0 - 10.5 K/uL Final  . RBC 12/22/2014 3.05* 4.22 - 5.81 MIL/uL Final  . Hemoglobin 12/22/2014 10.0* 13.0 - 17.0 g/dL Final  . HCT 12/22/2014 32.0* 39.0 - 52.0 % Final  . MCV 12/22/2014 104.9* 78.0 - 100.0 fL Final  . MCH 12/22/2014 32.8  26.0 - 34.0 pg Final  . MCHC 12/22/2014 31.3  30.0 - 36.0 g/dL Final  . RDW 12/22/2014 15.4  11.5 - 15.5 % Final  . Platelets 12/22/2014 151  150 - 400 K/uL  Final  . MRSA by PCR 12/22/2014 POSITIVE* NEGATIVE Final   Comment:        The GeneXpert MRSA Assay (FDA approved for NASAL specimens only), is one component of a comprehensive MRSA colonization surveillance program. It is not intended to diagnose MRSA infection nor to guide or monitor treatment for MRSA infections.   . Sodium 12/23/2014  134* 135 - 145 mmol/L Final  . Potassium 12/23/2014 4.1  3.5 - 5.1 mmol/L Final  . Chloride 12/23/2014 100  96 - 112 mmol/L Final  . CO2 12/23/2014 23  19 - 32 mmol/L Final  . Glucose, Bld 12/23/2014 85  70 - 99 mg/dL Final  . BUN 12/23/2014 15  6 - 23 mg/dL Final  . Creatinine, Ser 12/23/2014 5.90* 0.50 - 1.35 mg/dL Final  . Calcium 12/23/2014 9.6  8.4 - 10.5 mg/dL Final  . GFR calc non Af Amer 12/23/2014 9* >90 mL/min Final  . GFR calc Af Amer 12/23/2014 11* >90 mL/min Final   Comment: (NOTE) The eGFR has been calculated using the CKD EPI equation. This calculation has not been validated in all clinical situations. eGFR's persistently <90 mL/min signify possible Chronic Kidney Disease.   . Anion gap 12/23/2014 11  5 - 15 Final  . Glucose-Capillary 12/23/2014 100* 70 - 99 mg/dL Final  . Glucose-Capillary 12/24/2014 90  70 - 99 mg/dL Final  Office Visit on 11/25/2014  Component Date Value Ref Range Status  . TSH 11/25/2014 2.351  0.350 - 4.500 uIU/mL Final  Lab on 11/11/2014  Component Date Value Ref Range Status  . Sodium 11/11/2014 138  135 - 145 mEq/L Final  . Potassium 11/11/2014 4.2  3.5 - 5.3 mEq/L Final  . Chloride 11/11/2014 98  96 - 112 mEq/L Final  . CO2 11/11/2014 28  19 - 32 mEq/L Final  . Glucose, Bld 11/11/2014 96  70 - 99 mg/dL Final  . BUN 11/11/2014 16  6 - 23 mg/dL Final  . Creat 11/11/2014 5.63* 0.50 - 1.35 mg/dL Final  . Calcium 11/11/2014 9.3  8.4 - 10.5 mg/dL Final  . WBC 11/11/2014 4.3  4.0 - 10.5 K/uL Final  . RBC 11/11/2014 2.44* 4.22 - 5.81 MIL/uL Final  . Hemoglobin 11/11/2014 8.1* 13.0 - 17.0 g/dL Final  .  HCT 11/11/2014 25.3* 39.0 - 52.0 % Final  . MCV 11/11/2014 103.7* 78.0 - 100.0 fL Final  . MCH 11/11/2014 33.2  26.0 - 34.0 pg Final  . MCHC 11/11/2014 32.0  30.0 - 36.0 g/dL Final  . RDW 11/11/2014 14.0  11.5 - 15.5 % Final  . Platelets 11/11/2014 258  150 - 400 K/uL Final  . MPV 11/11/2014 8.5* 8.6 - 12.4 fL Final  . Neutrophils Relative % 11/11/2014 55  43 - 77 % Final  . Neutro Abs 11/11/2014 2.4  1.7 - 7.7 K/uL Final  . Lymphocytes Relative 11/11/2014 34  12 - 46 % Final  . Lymphs Abs 11/11/2014 1.5  0.7 - 4.0 K/uL Final  . Monocytes Relative 11/11/2014 8  3 - 12 % Final  . Monocytes Absolute 11/11/2014 0.3  0.1 - 1.0 K/uL Final  . Eosinophils Relative 11/11/2014 2  0 - 5 % Final  . Eosinophils Absolute 11/11/2014 0.1  0.0 - 0.7 K/uL Final  . Basophils Relative 11/11/2014 1  0 - 1 % Final  . Basophils Absolute 11/11/2014 0.0  0.0 - 0.1 K/uL Final  . Smear Review 11/11/2014 Criteria for review not met   Final  . HIV 1 RNA Quant 11/11/2014 <20  <20 copies/mL Final   HIV 1 RNA not detected.  Marland Kitchen HIV1 RNA Quant, Log 11/11/2014 <1.30  <1.30 log 10 Final   Comment:   This test utilizes the [place] FDA approved Roche HIV-1 Test Kit by RT-PCR.     . RPR Ser Ql 11/11/2014 NON REAC  NON REAC Final  .  HCV Quantitative 11/11/2014 Not Detected  <15 IU/mL Final   Comment: No detectable level of HCV RNA.     Marland Kitchen HCV Quantitative Log 11/11/2014 NOT CALC  <1.18 log 10 Final   Comment:   This test utilizes the [place] FDA approved Roche HCV Test Kit by RT-PCR.   . CD4 T Cell Abs 11/11/2014 460  400 - 2700 /uL Final  . CD4 % Helper T Cell 11/11/2014 36  33 - 55 % Final   Performed at [facility]  Admission on 10/12/2014, Discharged on 10/16/2014  Component Date Value Ref Range Status  . Sodium 10/12/2014 134* 135 - 145 mmol/L Final  . Potassium 10/12/2014 4.1  3.5 - 5.1 mmol/L Final  . Glucose, Bld 10/12/2014 92  70 - 99 mg/dL Final  . HCT 10/12/2014 33.0* 39.0 - 52.0 % Final  .  Hemoglobin 10/12/2014 11.2* 13.0 - 17.0 g/dL Final  . Glucose-Capillary 10/12/2014 95  70 - 99 mg/dL Final  . Comment 1 10/12/2014 Documented in Chart   Final  . Comment 2 10/12/2014 Notify RN   Final  . Glucose-Capillary 10/12/2014 116* 70 - 99 mg/dL Final  . WBC 10/13/2014 11.1* 4.0 - 10.5 K/uL Final  . RBC 10/13/2014 2.97* 4.22 - 5.81 MIL/uL Final  . Hemoglobin 10/13/2014 10.3* 13.0 - 17.0 g/dL Final  . HCT 10/13/2014 30.9* 39.0 - 52.0 % Final  . MCV 10/13/2014 104.0* 78.0 - 100.0 fL Final  . MCH 10/13/2014 34.7* 26.0 - 34.0 pg Final  . MCHC 10/13/2014 33.3  30.0 - 36.0 g/dL Final  . RDW 10/13/2014 13.4  11.5 - 15.5 % Final  . Platelets 10/13/2014 182  150 - 400 K/uL Final  . Sodium 10/13/2014 132* 135 - 145 mmol/L Final  . Potassium 10/13/2014 6.1* 3.5 - 5.1 mmol/L Final   Comment: REPEATED TO VERIFY NO VISIBLE HEMOLYSIS CRITICAL RESULT CALLED TO, READ BACK BY AND VERIFIED WITH: V.THOMPSON,RN 10/13/14 0712 BY BSLADE   . Chloride 10/13/2014 95* 96 - 112 mmol/L Final  . CO2 10/13/2014 21  19 - 32 mmol/L Final  . Glucose, Bld 10/13/2014 121* 70 - 99 mg/dL Final  . BUN 10/13/2014 64* 6 - 23 mg/dL Final  . Creatinine, Ser 10/13/2014 11.05* 0.50 - 1.35 mg/dL Final  . Calcium 10/13/2014 9.5  8.4 - 10.5 mg/dL Final  . GFR calc non Af Amer 10/13/2014 4* >90 mL/min Final  . GFR calc Af Amer 10/13/2014 5* >90 mL/min Final   Comment: (NOTE) The eGFR has been calculated using the CKD EPI equation. This calculation has not been validated in all clinical situations. eGFR's persistently <90 mL/min signify possible Chronic Kidney Disease.   . Anion gap 10/13/2014 16* 5 - 15 Final  . Hepatitis B Surface Ag 10/13/2014 NEGATIVE  NEGATIVE Final   Performed at [facility]  . Glucose-Capillary 10/13/2014 115* 70 - 99 mg/dL Final  . WBC 10/14/2014 10.7* 4.0 - 10.5 K/uL Final  . RBC 10/14/2014 2.87* 4.22 - 5.81 MIL/uL Final  . Hemoglobin 10/14/2014 9.9* 13.0 - 17.0 g/dL Final  . HCT  10/14/2014 30.1* 39.0 - 52.0 % Final  . MCV 10/14/2014 104.9* 78.0 - 100.0 fL Final  . MCH 10/14/2014 34.5* 26.0 - 34.0 pg Final  . MCHC 10/14/2014 32.9  30.0 - 36.0 g/dL Final  . RDW 10/14/2014 13.6  11.5 - 15.5 % Final  . Platelets 10/14/2014 165  150 - 400 K/uL Final  . Sodium 10/14/2014 131* 135 - 145 mmol/L  Final  . Potassium 10/14/2014 4.6  3.5 - 5.1 mmol/L Final   DELTA CHECK NOTED  . Chloride 10/14/2014 90* 96 - 112 mmol/L Final  . CO2 10/14/2014 27  19 - 32 mmol/L Final  . Glucose, Bld 10/14/2014 124* 70 - 99 mg/dL Final  . BUN 10/14/2014 40* 6 - 23 mg/dL Final   DELTA CHECK NOTED  . Creatinine, Ser 10/14/2014 7.69* 0.50 - 1.35 mg/dL Final  . Calcium 10/14/2014 9.5  8.4 - 10.5 mg/dL Final  . GFR calc non Af Amer 10/14/2014 7* >90 mL/min Final  . GFR calc Af Amer 10/14/2014 8* >90 mL/min Final   Comment: (NOTE) The eGFR has been calculated using the CKD EPI equation. This calculation has not been validated in all clinical situations. eGFR's persistently <90 mL/min signify possible Chronic Kidney Disease.   . Anion gap 10/14/2014 14  5 - 15 Final  . WBC 10/15/2014 7.8  4.0 - 10.5 K/uL Final  . RBC 10/15/2014 2.52* 4.22 - 5.81 MIL/uL Final  . Hemoglobin 10/15/2014 8.9* 13.0 - 17.0 g/dL Final  . HCT 10/15/2014 25.6* 39.0 - 52.0 % Final  . MCV 10/15/2014 101.6* 78.0 - 100.0 fL Final  . MCH 10/15/2014 35.3* 26.0 - 34.0 pg Final  . MCHC 10/15/2014 34.8  30.0 - 36.0 g/dL Final  . RDW 10/15/2014 12.8  11.5 - 15.5 % Final  . Platelets 10/15/2014 149* 150 - 400 K/uL Final  . Sodium 10/15/2014 125* 135 - 145 mmol/L Final  . Potassium 10/15/2014 3.9  3.5 - 5.1 mmol/L Final  . Chloride 10/15/2014 86* 96 - 112 mmol/L Final  . CO2 10/15/2014 26  19 - 32 mmol/L Final  . Glucose, Bld 10/15/2014 122* 70 - 99 mg/dL Final  . BUN 10/15/2014 63* 6 - 23 mg/dL Final  . Creatinine, Ser 10/15/2014 9.71* 0.50 - 1.35 mg/dL Final  . Calcium 10/15/2014 9.1  8.4 - 10.5 mg/dL Final  . Phosphorus  10/15/2014 8.3* 2.3 - 4.6 mg/dL Final  . Albumin 10/15/2014 3.2* 3.5 - 5.2 g/dL Final  . GFR calc non Af Amer 10/15/2014 5* >90 mL/min Final  . GFR calc Af Amer 10/15/2014 6* >90 mL/min Final   Comment: (NOTE) The eGFR has been calculated using the CKD EPI equation. This calculation has not been validated in all clinical situations. eGFR's persistently <90 mL/min signify possible Chronic Kidney Disease.   Georgiann Hahn gap 10/15/2014 13  5 - 15 Final  Hospital Outpatient Visit on 10/07/2014  Component Date Value Ref Range Status  . MRSA, PCR 10/07/2014 NEGATIVE  NEGATIVE Final  . Staphylococcus aureus 10/07/2014 NEGATIVE  NEGATIVE Final   Comment:        The Xpert SA Assay (FDA approved for NASAL specimens in patients over 20 years of age), is one component of a comprehensive surveillance program.  Test performance has been validated by [facility] for patients greater than or equal to 57 year old. It is not intended to diagnose infection nor to guide or monitor treatment.   . Sodium 10/07/2014 136  135 - 145 mmol/L Final  . Potassium 10/07/2014 3.9  3.5 - 5.1 mmol/L Final  . Chloride 10/07/2014 94* 96 - 112 mmol/L Final  . CO2 10/07/2014 29  19 - 32 mmol/L Final  . Glucose, Bld 10/07/2014 124* 70 - 99 mg/dL Final  . BUN 10/07/2014 32* 6 - 23 mg/dL Final  . Creatinine, Ser 10/07/2014 8.30* 0.50 - 1.35 mg/dL Final  . Calcium 10/07/2014 9.7  8.4 - 10.5 mg/dL Final  . Total Protein 10/07/2014 8.6* 6.0 - 8.3 g/dL Final  . Albumin 10/07/2014 3.9  3.5 - 5.2 g/dL Final  . AST 10/07/2014 17  0 - 37 U/L Final  . ALT 10/07/2014 14  0 - 53 U/L Final  . Alkaline Phosphatase 10/07/2014 74  39 - 117 U/L Final  . Total Bilirubin 10/07/2014 0.8  0.3 - 1.2 mg/dL Final  . GFR calc non Af Amer 10/07/2014 6* >90 mL/min Final  . GFR calc Af Amer 10/07/2014 7* >90 mL/min Final   Comment: (NOTE) The eGFR has been calculated using the CKD EPI equation. This calculation has not been validated in all  clinical situations. eGFR's persistently <90 mL/min signify possible Chronic Kidney Disease.   . Anion gap 10/07/2014 13  5 - 15 Final  . WBC 10/07/2014 5.7  4.0 - 10.5 K/uL Final  . RBC 10/07/2014 3.35* 4.22 - 5.81 MIL/uL Final  . Hemoglobin 10/07/2014 11.9* 13.0 - 17.0 g/dL Final  . HCT 10/07/2014 34.6* 39.0 - 52.0 % Final  . MCV 10/07/2014 103.3* 78.0 - 100.0 fL Final  . MCH 10/07/2014 35.5* 26.0 - 34.0 pg Final  . MCHC 10/07/2014 34.4  30.0 - 36.0 g/dL Final  . RDW 10/07/2014 12.7  11.5 - 15.5 % Final  . Platelets 10/07/2014 167  150 - 400 K/uL Final     Assessment/Plan  1. Essential hypertension Controlled under current medications  2. Human immunodeficiency virus (HIV) disease To see infectious disease specialist 01/18/2015.  3. End stage renal disease Continues on hemodialysis Tuesdays, Thursdays, and Saturday  4. Atrial fibrillation, unspecified Right controlled

## 2014-12-31 DIAGNOSIS — N186 End stage renal disease: Secondary | ICD-10-CM | POA: Diagnosis not present

## 2014-12-31 DIAGNOSIS — N2581 Secondary hyperparathyroidism of renal origin: Secondary | ICD-10-CM | POA: Diagnosis not present

## 2014-12-31 DIAGNOSIS — D509 Iron deficiency anemia, unspecified: Secondary | ICD-10-CM | POA: Diagnosis not present

## 2014-12-31 DIAGNOSIS — D631 Anemia in chronic kidney disease: Secondary | ICD-10-CM | POA: Diagnosis not present

## 2015-01-02 DIAGNOSIS — D631 Anemia in chronic kidney disease: Secondary | ICD-10-CM | POA: Diagnosis not present

## 2015-01-02 DIAGNOSIS — N2581 Secondary hyperparathyroidism of renal origin: Secondary | ICD-10-CM | POA: Diagnosis not present

## 2015-01-02 DIAGNOSIS — D509 Iron deficiency anemia, unspecified: Secondary | ICD-10-CM | POA: Diagnosis not present

## 2015-01-02 DIAGNOSIS — N186 End stage renal disease: Secondary | ICD-10-CM | POA: Diagnosis not present

## 2015-01-04 ENCOUNTER — Encounter: Payer: Self-pay | Admitting: Nurse Practitioner

## 2015-01-04 DIAGNOSIS — N186 End stage renal disease: Secondary | ICD-10-CM | POA: Diagnosis not present

## 2015-01-04 DIAGNOSIS — I120 Hypertensive chronic kidney disease with stage 5 chronic kidney disease or end stage renal disease: Secondary | ICD-10-CM | POA: Diagnosis not present

## 2015-01-04 DIAGNOSIS — Z992 Dependence on renal dialysis: Secondary | ICD-10-CM | POA: Diagnosis not present

## 2015-01-04 DIAGNOSIS — T814XXD Infection following a procedure, subsequent encounter: Secondary | ICD-10-CM | POA: Diagnosis not present

## 2015-01-04 DIAGNOSIS — F17210 Nicotine dependence, cigarettes, uncomplicated: Secondary | ICD-10-CM | POA: Diagnosis not present

## 2015-01-04 DIAGNOSIS — Z21 Asymptomatic human immunodeficiency virus [HIV] infection status: Secondary | ICD-10-CM | POA: Diagnosis not present

## 2015-01-05 DIAGNOSIS — N186 End stage renal disease: Secondary | ICD-10-CM | POA: Diagnosis not present

## 2015-01-05 DIAGNOSIS — N2581 Secondary hyperparathyroidism of renal origin: Secondary | ICD-10-CM | POA: Diagnosis not present

## 2015-01-05 DIAGNOSIS — D509 Iron deficiency anemia, unspecified: Secondary | ICD-10-CM | POA: Diagnosis not present

## 2015-01-05 DIAGNOSIS — D631 Anemia in chronic kidney disease: Secondary | ICD-10-CM | POA: Diagnosis not present

## 2015-01-06 DIAGNOSIS — T814XXD Infection following a procedure, subsequent encounter: Secondary | ICD-10-CM | POA: Diagnosis not present

## 2015-01-06 DIAGNOSIS — I120 Hypertensive chronic kidney disease with stage 5 chronic kidney disease or end stage renal disease: Secondary | ICD-10-CM | POA: Diagnosis not present

## 2015-01-06 DIAGNOSIS — N186 End stage renal disease: Secondary | ICD-10-CM | POA: Diagnosis not present

## 2015-01-06 DIAGNOSIS — Z21 Asymptomatic human immunodeficiency virus [HIV] infection status: Secondary | ICD-10-CM | POA: Diagnosis not present

## 2015-01-06 DIAGNOSIS — F17210 Nicotine dependence, cigarettes, uncomplicated: Secondary | ICD-10-CM | POA: Diagnosis not present

## 2015-01-06 DIAGNOSIS — Z992 Dependence on renal dialysis: Secondary | ICD-10-CM | POA: Diagnosis not present

## 2015-01-07 ENCOUNTER — Other Ambulatory Visit: Payer: Self-pay | Admitting: Internal Medicine

## 2015-01-07 DIAGNOSIS — N186 End stage renal disease: Secondary | ICD-10-CM | POA: Diagnosis not present

## 2015-01-07 DIAGNOSIS — D631 Anemia in chronic kidney disease: Secondary | ICD-10-CM | POA: Diagnosis not present

## 2015-01-07 DIAGNOSIS — D509 Iron deficiency anemia, unspecified: Secondary | ICD-10-CM | POA: Diagnosis not present

## 2015-01-07 DIAGNOSIS — N2581 Secondary hyperparathyroidism of renal origin: Secondary | ICD-10-CM | POA: Diagnosis not present

## 2015-01-07 DIAGNOSIS — B20 Human immunodeficiency virus [HIV] disease: Secondary | ICD-10-CM

## 2015-01-09 DIAGNOSIS — Z992 Dependence on renal dialysis: Secondary | ICD-10-CM | POA: Diagnosis not present

## 2015-01-09 DIAGNOSIS — I120 Hypertensive chronic kidney disease with stage 5 chronic kidney disease or end stage renal disease: Secondary | ICD-10-CM | POA: Diagnosis not present

## 2015-01-09 DIAGNOSIS — N186 End stage renal disease: Secondary | ICD-10-CM | POA: Diagnosis not present

## 2015-01-12 DIAGNOSIS — D509 Iron deficiency anemia, unspecified: Secondary | ICD-10-CM | POA: Diagnosis not present

## 2015-01-12 DIAGNOSIS — D631 Anemia in chronic kidney disease: Secondary | ICD-10-CM | POA: Diagnosis not present

## 2015-01-12 DIAGNOSIS — N186 End stage renal disease: Secondary | ICD-10-CM | POA: Diagnosis not present

## 2015-01-12 DIAGNOSIS — N2581 Secondary hyperparathyroidism of renal origin: Secondary | ICD-10-CM | POA: Diagnosis not present

## 2015-01-13 ENCOUNTER — Other Ambulatory Visit: Payer: Self-pay | Admitting: *Deleted

## 2015-01-13 MED ORDER — HYDRALAZINE HCL 25 MG PO TABS: 75.0000 mg | ORAL_TABLET | Freq: Three times a day (TID) | ORAL | Status: DC

## 2015-01-18 ENCOUNTER — Encounter: Payer: Self-pay | Admitting: Internal Medicine

## 2015-01-18 ENCOUNTER — Ambulatory Visit (INDEPENDENT_AMBULATORY_CARE_PROVIDER_SITE_OTHER): Payer: Medicare Other | Admitting: Internal Medicine

## 2015-01-18 VITALS — BP 132/84 | HR 60 | Temp 98.5°F | Wt 217.0 lb

## 2015-01-18 DIAGNOSIS — B20 Human immunodeficiency virus [HIV] disease: Secondary | ICD-10-CM

## 2015-01-18 MED ORDER — DARUNAVIR-COBICISTAT 800-150 MG PO TABS: 1.0000 | ORAL_TABLET | Freq: Every day | ORAL | Status: DC

## 2015-01-18 MED ORDER — DARUNAVIR-COBICISTAT 800-150 MG PO TABS
1.0000 | ORAL_TABLET | Freq: Every day | ORAL | Status: DC
Start: 2015-01-18 — End: 2016-01-31

## 2015-01-18 NOTE — Progress Notes (Signed)
Patient ID: Bryan Degroote., male   DOB: Dec 06, 1950, 64 y.o.   MRN: [id number]       Patient ID: Bryan Jenkinson., male   DOB: December 28, 1950, 64 y.o.   MRN: [id number]  HPI 64yo M with HIV-HCV co infection, ESRD on HD , had SVR 12 in early march continuously undetectable. Well controlled hiv. Wanted to decrease his pill intake. Overall doing well since his abd hernia surgery  Outpatient Encounter Prescriptions as of 01/18/2015  Medication Sig  . allopurinol (ZYLOPRIM) 300 MG tablet TAKE ONE TABLET BY MOUTH ONCE DAILY FOR GOUT  . aspirin EC 325 MG tablet Take 1 tablet (325 mg total) by mouth daily.  . carvedilol (COREG) 6.25 MG tablet Take 1 tablet (6.25 mg total) by mouth 2 (two) times daily.  Marland Kitchen gabapentin (NEURONTIN) 100 MG capsule TAKE ONE CAPSULE BY MOUTH THREE TIMES DAILY  . hydrALAZINE (APRESOLINE) 25 MG tablet Take 3 tablets (75 mg total) by mouth every 8 (eight) hours.  . lamivudine (EPIVIR) 100 MG tablet Take 0.5 tablets (50 mg total) by mouth daily.  Marland Kitchen lanthanum (FOSRENOL) 1000 MG chewable tablet Chew 1,000 mg by mouth 3 (three) times daily with meals.   Marland Kitchen levothyroxine (SYNTHROID, LEVOTHROID) 50 MCG tablet Take 1 tablet (50 mcg total) by mouth daily. (Patient taking differently: Take 50 mcg by mouth daily before breakfast. )  . NORVIR 100 MG TABS tablet TAKE 1 TABLET BY MOUTH DAILY  . oxyCODONE-acetaminophen (PERCOCET/ROXICET) 5-325 MG per tablet Take 1 tablet by mouth every 6 (six) hours as needed for moderate pain.  Marland Kitchen PREZISTA 800 MG tablet TAKE ONE TABLET BY MOUTH DAILY WITH BREAKFAST  . raltegravir (ISENTRESS) 400 MG tablet TAKE ONE TABLET BY MOUTH BY MOUTH TWICE DAILY  . zidovudine (RETROVIR) 300 MG tablet Take 1 tablet (300 mg total) by mouth daily.   No facility-administered encounter medications on file as of 01/18/2015.     Patient Active Problem List   Diagnosis Date Noted  . Hypertensive urgency 12/21/2014  . Hypothyroid 12/21/2014  . Chronic hepatitis C without hepatic coma  11/25/2014  . Ventral incisional hernia 10/12/2014  . Thyroid activity decreased 07/29/2014  . Essential hypertension 06/24/2014  . Floaters in visual field 06/24/2014  . Atrial fibrillation, unspecified 06/24/2014  . Other specified abdominal hernia without obstruction or gangrene 06/24/2014  . Esophageal reflux 06/24/2014  . Annual physical exam 06/24/2014  . Gynecomastia 11/12/2013  . Other complications due to renal dialysis device, implant, and graft 01/29/2013  . Colostomy status 12/26/2012  . Aftercare following surgery of the circulatory system, [place] 12/18/2012  . End stage renal disease 10/30/2012  . Alcohol abuse   . Atrial fibrillation, permanent 04/04/2012  . Fever 04/01/2012  . Hyperkalemia 03/29/2012  . Secondary hyperparathyroidism (of renal origin) 03/29/2012  . ESRD (end stage renal disease) 02/29/2012  . Anemia of chronic renal failure 02/29/2012  . Normocytic anemia 02/17/2012  . Acute hepatitis C virus infection 10/07/2010  . Chronic gouty arthropathy 10/07/2010  . 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 Human immunodeficiency virus (HIV) disease 09/15/2010     Health Maintenance Due  Topic Date Due  . TETANUS/TDAP  11/18/1969  . COLONOSCOPY  11/18/2000  . ZOSTAVAX  11/19/2010     Review of Systems +10 point ros is negative Physical Exam   BP 132/84 mmHg  Pulse 60  Temp(Src) 98.5 F (36.9 C) (Oral)  Wt 217 lb (98.431 kg) Physical Exam  Constitutional: He is oriented to person, place, and time.  He appears well-developed and well-nourished. No distress.  HENT:  Mouth/Throat: Oropharynx is clear and moist. No oropharyngeal exudate.  Cardiovascular: Normal rate, regular rhythm and normal heart sounds. Exam reveals no gallop and no friction rub.  No murmur heard.  Pulmonary/Chest: Effort normal and breath sounds normal. No respiratory distress. He has no wheezes.  Abdominal: Soft. Bowel sounds are normal. He exhibits no distension. There is no  tenderness. Well healed abdominal surgical scar Lymphadenopathy:  He has no cervical adenopathy.  Neurological: He is alert and oriented to person, place, and time.  Skin: Skin is warm and dry. No rash noted. No erythema.  Psychiatric: He has a normal mood and affect. His behavior is normal.    Lab Results  Component Value Date   CD4TCELL 36 11/11/2014   Lab Results  Component Value Date   CD4TABS 460 11/11/2014   CD4TABS 650 07/20/2014   CD4TABS 510 03/25/2014   Lab Results  Component Value Date   HIV1RNAQUANT <20 11/11/2014   Lab Results  Component Value Date   HEPBSAB NEGATIVE 02/18/2012   No results found for: RPR  CBC Lab Results  Component Value Date   WBC 4.3 12/22/2014   RBC 3.05* 12/22/2014   HGB 10.0* 12/22/2014   HCT 32.0* 12/22/2014   PLT 151 12/22/2014   MCV 104.9* 12/22/2014   MCH 32.8 12/22/2014   MCHC 31.3 12/22/2014   RDW 15.4 12/22/2014   LYMPHSABS 1.6 12/21/2014   MONOABS 0.3 12/21/2014   EOSABS 0.1 12/21/2014   BASOSABS 0.0 12/21/2014   BMET Lab Results  Component Value Date   NA 134* 12/23/2014   K 4.1 12/23/2014   CL 100 12/23/2014   CO2 23 12/23/2014   GLUCOSE 85 12/23/2014   BUN 15 12/23/2014   CREATININE 5.90* 12/23/2014   CALCIUM 9.6 12/23/2014   GFRNONAA 9* 12/23/2014   GFRAA 11* 12/23/2014     Assessment and Plan  hiv disease = well controlled. Can reduce his pill burden by 1, by switching to prezcobix.   ESRD on HD = renal dose  HTN urgency = under good control  hcv chronic, without hepatic coma= finish treatment, has upcoming CT scan for hernia post surgical evaluation, will look at liver as well  abd hernia = surgical wound well healed

## 2015-01-22 ENCOUNTER — Emergency Department (HOSPITAL_COMMUNITY): Payer: Medicare Other

## 2015-01-22 ENCOUNTER — Encounter (HOSPITAL_COMMUNITY): Payer: Self-pay | Admitting: Emergency Medicine

## 2015-01-22 ENCOUNTER — Emergency Department (HOSPITAL_COMMUNITY)
Admission: EM | Admit: 2015-01-22 | Discharge: 2015-01-22 | Disposition: A | Discharge status: Home / Self Care | Payer: Medicare Other | Attending: Emergency Medicine | Admitting: Emergency Medicine

## 2015-01-22 DIAGNOSIS — W010XXA Fall on same level from slipping, tripping and stumbling without subsequent striking against object, initial encounter: Secondary | ICD-10-CM

## 2015-01-22 DIAGNOSIS — S93401A Sprain of unspecified ligament of right ankle, initial encounter: Secondary | ICD-10-CM

## 2015-01-22 DIAGNOSIS — Z992 Dependence on renal dialysis: Secondary | ICD-10-CM | POA: Diagnosis not present

## 2015-01-22 DIAGNOSIS — M109 Gout, unspecified: Secondary | ICD-10-CM | POA: Diagnosis not present

## 2015-01-22 DIAGNOSIS — N186 End stage renal disease: Secondary | ICD-10-CM | POA: Insufficient documentation

## 2015-01-22 DIAGNOSIS — Y998 Other external cause status: Secondary | ICD-10-CM | POA: Diagnosis not present

## 2015-01-22 DIAGNOSIS — Z872 Personal history of diseases of the skin and subcutaneous tissue: Secondary | ICD-10-CM | POA: Insufficient documentation

## 2015-01-22 DIAGNOSIS — E039 Hypothyroidism, unspecified: Secondary | ICD-10-CM | POA: Diagnosis not present

## 2015-01-22 DIAGNOSIS — Z862 Personal history of diseases of the blood and blood-forming organs and certain disorders involving the immune mechanism: Secondary | ICD-10-CM | POA: Diagnosis not present

## 2015-01-22 DIAGNOSIS — S8392XA Sprain of unspecified site of left knee, initial encounter: Secondary | ICD-10-CM | POA: Diagnosis not present

## 2015-01-22 DIAGNOSIS — I120 Hypertensive chronic kidney disease with stage 5 chronic kidney disease or end stage renal disease: Secondary | ICD-10-CM | POA: Diagnosis not present

## 2015-01-22 DIAGNOSIS — Z8709 Personal history of other diseases of the respiratory system: Secondary | ICD-10-CM | POA: Insufficient documentation

## 2015-01-22 DIAGNOSIS — Y9389 Activity, other specified: Secondary | ICD-10-CM | POA: Diagnosis not present

## 2015-01-22 DIAGNOSIS — Z8719 Personal history of other diseases of the digestive system: Secondary | ICD-10-CM | POA: Diagnosis not present

## 2015-01-22 DIAGNOSIS — Z21 Asymptomatic human immunodeficiency virus [HIV] infection status: Secondary | ICD-10-CM | POA: Insufficient documentation

## 2015-01-22 DIAGNOSIS — Y9289 Other specified places as the place of occurrence of the external cause: Secondary | ICD-10-CM | POA: Insufficient documentation

## 2015-01-22 DIAGNOSIS — M1990 Unspecified osteoarthritis, unspecified site: Secondary | ICD-10-CM | POA: Diagnosis not present

## 2015-01-22 DIAGNOSIS — Z79899 Other long term (current) drug therapy: Secondary | ICD-10-CM | POA: Insufficient documentation

## 2015-01-22 DIAGNOSIS — S8002XA Contusion of left knee, initial encounter: Secondary | ICD-10-CM

## 2015-01-22 DIAGNOSIS — Z8619 Personal history of other infectious and parasitic diseases: Secondary | ICD-10-CM | POA: Diagnosis not present

## 2015-01-22 DIAGNOSIS — S4991XA Unspecified injury of right shoulder and upper arm, initial encounter: Secondary | ICD-10-CM | POA: Diagnosis not present

## 2015-01-22 DIAGNOSIS — W19XXXA Unspecified fall, initial encounter: Secondary | ICD-10-CM

## 2015-01-22 DIAGNOSIS — R52 Pain, unspecified: Secondary | ICD-10-CM | POA: Diagnosis not present

## 2015-01-22 DIAGNOSIS — I252 Old myocardial infarction: Secondary | ICD-10-CM | POA: Insufficient documentation

## 2015-01-22 DIAGNOSIS — S93601A Unspecified sprain of right foot, initial encounter: Secondary | ICD-10-CM | POA: Diagnosis not present

## 2015-01-22 DIAGNOSIS — S99911A Unspecified injury of right ankle, initial encounter: Secondary | ICD-10-CM | POA: Diagnosis present

## 2015-01-22 DIAGNOSIS — Z7982 Long term (current) use of aspirin: Secondary | ICD-10-CM | POA: Diagnosis not present

## 2015-01-22 DIAGNOSIS — M79671 Pain in right foot: Secondary | ICD-10-CM | POA: Diagnosis not present

## 2015-01-22 MED ORDER — TRAMADOL HCL 50 MG PO TABS
50.0000 mg | ORAL_TABLET | Freq: Once | ORAL | Status: AC
  Administered 2015-01-22: 50 mg via ORAL
  Filled 2015-01-22: qty 1

## 2015-01-22 NOTE — ED Notes (Signed)
Patient assisted to stand at bedside, refusing to walk, states that he cannot bear any weight on his foot due to pain

## 2015-01-22 NOTE — Discharge Instructions (Signed)
It was our pleasure to provide your ER care today - we hope that you feel better.  Take tylenol/advil as need for pain.  Follow up with primary care doctor in 1 week if symptoms fail to improve/resolve.  Return to ER if worse, new symptoms, worsening or severe pain, other concern.  You were given pain medication in the ER - no driving for the next 4 hours.     Foot Sprain The muscles and cord like structures which attach muscle to bone (tendons) that surround the feet are made up of units. A foot sprain can occur at the weakest spot in any of these units. This condition is most often caused by injury to or overuse of the foot, as from playing contact sports, or aggravating a previous injury, or from poor conditioning, or obesity. SYMPTOMS  Pain with movement of the foot.  Tenderness and swelling at the injury site.  Loss of strength is present in moderate or severe sprains. THE THREE GRADES OR SEVERITY OF FOOT SPRAIN ARE: 1. Mild (Grade I): Slightly pulled muscle without tearing of muscle or tendon fibers or loss of strength. 2. Moderate (Grade II): Tearing of fibers in a muscle, tendon, or at the attachment to bone, with small decrease in strength. 3. Severe (Grade III): Rupture of the muscle-tendon-bone attachment, with separation of fibers. Severe sprain requires surgical repair. Often repeating (chronic) sprains are caused by overuse. Sudden (acute) sprains are caused by direct injury or over-use. DIAGNOSIS  Diagnosis of this condition is usually by your own observation. If problems continue, a caregiver may be required for further evaluation and treatment. X-rays may be required to make sure there are not breaks in the bones (fractures) present. Continued problems may require physical therapy for treatment. PREVENTION 1. Use strength and conditioning exercises appropriate for your sport. 2. Warm up properly prior to working out. 3. Use athletic shoes that are made for the sport  you are participating in. 4. Allow adequate time for healing. Early return to activities makes repeat injury more likely, and can lead to an unstable arthritic foot that can result in prolonged disability. Mild sprains generally heal in 3 to 10 days, with moderate and severe sprains taking 2 to 10 weeks. Your caregiver can help you determine the proper time required for healing. HOME CARE INSTRUCTIONS  1. Apply ice to the injury for 15-20 minutes, 03-04 times per day. Put the ice in a plastic bag and place a towel between the bag of ice and your skin. 2. An elastic wrap (like an Ace bandage) may be used to keep swelling down. 3. Keep foot above the level of the heart, or at least raised on a footstool, when swelling and pain are present. 4. Try to avoid use other than gentle range of motion while the foot is painful. Do not resume use until instructed by your caregiver. Then begin use gradually, not increasing use to the point of pain. If pain does develop, decrease use and continue the above measures, gradually increasing activities that do not cause discomfort, until you gradually achieve normal use. 5. Use crutches if and as instructed, and for the length of time instructed. 6. Keep injured foot and ankle wrapped between treatments. 7. Massage foot and ankle for comfort and to keep swelling down. Massage from the toes up towards the knee. 8. Only take over-the-counter or prescription medicines for pain, discomfort, or fever as directed by your caregiver. SEEK IMMEDIATE MEDICAL CARE IF:  1. Your pain  and swelling increase, or pain is not controlled with medications. 2. You have loss of feeling in your foot or your foot turns cold or blue. 3. You develop new, unexplained symptoms, or an increase of the symptoms that brought you to your caregiver. MAKE SURE YOU:  1. Understand these instructions. 2. Will watch your condition. 3. Will get help right away if you are not doing well or get  worse. Document Released: 02/17/2002 Document Revised: 11/20/2011 Document Reviewed: 04/16/2008 [facility] Patient Information 2015 [place], [place]. This information is not intended to replace advice given to you by your health care provider. Make sure you discuss any questions you have with your health care provider.     Ankle Sprain An ankle sprain is an injury to the strong, fibrous tissues (ligaments) that hold the bones of your ankle joint together.  CAUSES An ankle sprain is usually caused by a fall or by twisting your ankle. Ankle sprains most commonly occur when you step on the outer edge of your foot, and your ankle turns inward. People who participate in sports are more prone to these types of injuries.  SYMPTOMS   Pain in your ankle. The pain may be present at rest or only when you are trying to stand or walk.  Swelling.  Bruising. Bruising may develop immediately or within 1 to 2 days after your injury.  Difficulty standing or walking, particularly when turning corners or changing directions. DIAGNOSIS  Your caregiver will ask you details about your injury and perform a physical exam of your ankle to determine if you have an ankle sprain. During the physical exam, your caregiver will press on and apply pressure to specific areas of your foot and ankle. Your caregiver will try to move your ankle in certain ways. An X-ray exam may be done to be sure a bone was not broken or a ligament did not separate from one of the bones in your ankle (avulsion fracture).  TREATMENT  Certain types of braces can help stabilize your ankle. Your caregiver can make a recommendation for this. Your caregiver may recommend the use of medicine for pain. If your sprain is severe, your caregiver may refer you to a surgeon who helps to restore function to parts of your skeletal system (orthopedist) or a physical therapist. [place]  4. Apply ice to your injury for 1-2 days or as directed by your  caregiver. Applying ice helps to reduce inflammation and pain. 1. Put ice in a plastic bag. 2. Place a towel between your skin and the bag. 3. Leave the ice on for 15-20 minutes at a time, every 2 hours while you are awake. 5. Only take over-the-counter or prescription medicines for pain, discomfort, or fever as directed by your caregiver. 6. Elevate your injured ankle above the level of your heart as much as possible for 2-3 days. 7. If your caregiver recommends crutches, use them as instructed. Gradually put weight on the affected ankle. Continue to use crutches or a cane until you can walk without feeling pain in your ankle. 8. If you have a plaster splint, wear the splint as directed by your caregiver. Do not rest it on anything harder than a pillow for the first 24 hours. Do not put weight on it. Do not get it wet. You may take it off to take a shower or bath. 9. You may have been given an elastic bandage to wear around your ankle to provide support. If the elastic bandage is  too tight (you have numbness or tingling in your foot or your foot becomes cold and blue), adjust the bandage to make it comfortable. 10. If you have an air splint, you may blow more air into it or let air out to make it more comfortable. You may take your splint off at night and before taking a shower or bath. Wiggle your toes in the splint several times per day to decrease swelling. SEEK MEDICAL CARE IF:  5. You have rapidly increasing bruising or swelling. 6. Your toes feel extremely cold or you lose feeling in your foot. 7. Your pain is not relieved with medicine. SEEK IMMEDIATE MEDICAL CARE IF: 9. Your toes are numb or blue. 10. You have severe pain that is increasing. MAKE SURE YOU:  4. Understand these instructions. 5. Will watch your condition. 6. Will get help right away if you are not doing well or get worse. Document Released: 08/28/2005 Document Revised: 05/22/2012 Document Reviewed: 09/09/2011 [facility]  Patient Information 2015 [place], [place]. This information is not intended to replace advice given to you by your health care provider. Make sure you discuss any questions you have with your health care provider.    Contusion A contusion is the result of an injury to the skin and underlying tissues and is usually caused by direct trauma. The injury results in the appearance of a bruise on the skin overlying the injured tissues. Contusions cause rupture and bleeding of the small capillaries and blood vessels and affect function, because the bleeding infiltrates muscles, tendons, nerves, or other soft tissues.  SYMPTOMS   Swelling and often a hard lump in the injured area, either superficial or deep.  Pain and tenderness over the area of the contusion.  Feeling of firmness when pressure is exerted over the contusion.  Discoloration under the skin, beginning with redness and progressing to the characteristic "black and blue" bruise. CAUSES  A contusion is typically the result of direct trauma. This is often by a blunt object.  RISK INCREASES WITH: 11. Sports that have a high likelihood of trauma (football, boxing, ice hockey, soccer, field hockey, martial arts, basketball, and baseball). 12. Sports that make falling from a height likely (high-jumping, pole-vaulting, skating, or gymnastics). 13. Any bleeding disorder (hemophilia) or taking medications that affect clotting (aspirin, nonsteroidal anti-inflammatory medications, or warfarin [Coumadin]). 14. Inadequate protection of exposed areas during contact sports. PREVENTION 8. Maintain physical fitness: 1. Joint and muscle flexibility. 2. Strength and endurance. 3. Coordination. 9. Wear proper protective equipment. Make sure it fits correctly. PROGNOSIS  Contusions typically heal without any complications. Healing time varies with the severity of injury and intake of medications that affect clotting. Contusions usually heal in 1 to 4  weeks. RELATED COMPLICATIONS  11. Damage to nearby nerves or blood vessels, causing numbness, coldness, or paleness. 12. Compartment syndrome. 13. Bleeding into the soft tissues that leads to disability. 14. Infiltrative-type bleeding, leading to the calcification and impaired function of the injured muscle (rare). 15. Prolonged healing time if usual activities are resumed too soon. 16. Infection if the skin over the injury site is broken. 17. Fracture of the bone underlying the contusion. 18. Stiffness in the joint where the injured muscle crosses. TREATMENT  Treatment initially consists of resting the injured area as well as medication and ice to reduce inflammation. The use of a compression bandage may also be helpful in minimizing inflammation. As pain diminishes and movement is tolerated, the joint where the affected muscle crosses should be moved  to prevent stiffness and the shortening (contracture) of the joint. Movement of the joint should begin as soon as possible. It is also important to work on maintaining strength within the affected muscles. Occasionally, extra padding over the area of contusion may be recommended before returning to sports, particularly if re-injury is likely.  MEDICATION  7. If pain relief is necessary these medications are often recommended: 1. Nonsteroidal anti-inflammatory medications, such as aspirin and ibuprofen. 2. Other minor pain relievers, such as acetaminophen, are often recommended. 8. Prescription pain relievers may be given by your caregiver. Use only as directed and only as much as you need. HEAT AND COLD 4. Cold treatment (icing) relieves pain and reduces inflammation. Cold treatment should be applied for 10 to 15 minutes every 2 to 3 hours for inflammation and pain and immediately after any activity that aggravates your symptoms. Use ice packs or an ice massage. (To do an ice massage fill a large styrofoam cup with water and freeze. Tear a small  amount of foam from the top so ice protrudes. Massage ice firmly over the injured area in a circle about the size of a softball.) 5. Heat treatment may be used prior to performing the stretching and strengthening activities prescribed by your caregiver, physical therapist, or athletic trainer. Use a heat pack or a warm soak. SEEK MEDICAL CARE IF:  1. Symptoms get worse or do not improve despite treatment in a few days. 2. You have difficulty moving a joint. 3. Any extremity becomes extremely painful, numb, pale, or cool (This is an emergency!). 4. Medication produces any side effects (bleeding, upset stomach, or allergic reaction). 5. Signs of infection (drainage from skin, headache, muscle aches, dizziness, fever, or general ill feeling) occur if skin was broken. Document Released: 08/28/2005 Document Revised: 11/20/2011 Document Reviewed: 12/10/2008 [facility] Patient Information 2015 [place], [place]. This information is not intended to replace advice given to you by your health care provider. Make sure you discuss any questions you have with your health care provider.    Cryotherapy Cryotherapy means treatment with cold. Ice or gel packs can be used to reduce both pain and swelling. Ice is the most helpful within the first 24 to 48 hours after an injury or flare-up from overusing a muscle or joint. Sprains, strains, spasms, burning pain, shooting pain, and aches can all be eased with ice. Ice can also be used when recovering from surgery. Ice is effective, has very few side effects, and is safe for most people to use. PRECAUTIONS  Ice is not a safe treatment option for people with:  Raynaud phenomenon. This is a condition affecting small blood vessels in the extremities. Exposure to cold may cause your problems to return.  Cold hypersensitivity. There are many forms of cold hypersensitivity, including:  Cold urticaria. Red, itchy hives appear on the skin when the tissues begin to warm after  being iced.  Cold erythema. This is a red, itchy rash caused by exposure to cold.  Cold hemoglobinuria. Red blood cells break down when the tissues begin to warm after being iced. The hemoglobin that carry oxygen are passed into the urine because they cannot combine with blood proteins fast enough.  Numbness or altered sensitivity in the area being iced. If you have any of the following conditions, do not use ice until you have discussed cryotherapy with your caregiver: 15. Heart conditions, such as arrhythmia, angina, or chronic heart disease. 16. High blood pressure. 17. Healing wounds or open skin in the  area being iced. 18. Current infections. 19. Rheumatoid arthritis. 20. Poor circulation. 21. Diabetes. Ice slows the blood flow in the region it is applied. This is beneficial when trying to stop inflamed tissues from spreading irritating chemicals to surrounding tissues. However, if you expose your skin to cold temperatures for too long or without the proper protection, you can damage your skin or nerves. Watch for signs of skin damage due to cold. HOME CARE INSTRUCTIONS Follow these tips to use ice and cold packs safely. 10. Place a dry or damp towel between the ice and skin. A damp towel will cool the skin more quickly, so you may need to shorten the time that the ice is used. 11. For a more rapid response, add gentle compression to the ice. 12. Ice for no more than 10 to 20 minutes at a time. The bonier the area you are icing, the less time it will take to get the benefits of ice. 13. Check your skin after 5 minutes to make sure there are no signs of a poor response to cold or skin damage. 14. Rest 20 minutes or more between uses. 15. Once your skin is numb, you can end your treatment. You can test numbness by very lightly touching your skin. The touch should be so light that you do not see the skin dimple from the pressure of your fingertip. When using ice, most people will feel these  normal sensations in this order: cold, burning, aching, and numbness. 63. Do not use ice on someone who cannot communicate their responses to pain, such as small children or people with dementia. HOW TO MAKE AN ICE PACK Ice packs are the most common way to use ice therapy. Other methods include ice massage, ice baths, and cryosprays. Muscle creams that cause a cold, tingly feeling do not offer the same benefits that ice offers and should not be used as a substitute unless recommended by your caregiver. To make an ice pack, do one of the following: 19. Place crushed ice or a bag of frozen vegetables in a sealable plastic bag. Squeeze out the excess air. Place this bag inside another plastic bag. Slide the bag into a pillowcase or place a damp towel between your skin and the bag. 20. Mix 3 parts water with 1 part rubbing alcohol. Freeze the mixture in a sealable plastic bag. When you remove the mixture from the freezer, it will be slushy. Squeeze out the excess air. Place this bag inside another plastic bag. Slide the bag into a pillowcase or place a damp towel between your skin and the bag. SEEK MEDICAL CARE IF: 9. You develop white spots on your skin. This may give the skin a blotchy (mottled) appearance. 10. Your skin turns blue or pale. 11. Your skin becomes waxy or hard. 12. Your swelling gets worse. MAKE SURE YOU:  6. Understand these instructions. 7. Will watch your condition. 8. Will get help right away if you are not doing well or get worse. Document Released: 04/24/2011 Document Revised: 01/12/2014 Document Reviewed: 04/24/2011 [facility] Patient Information 2015 [place], [place]. This information is not intended to replace advice given to you by your health care provider. Make sure you discuss any questions you have with your health care provider.   Crutch Use Crutches are used to take weight off one of your legs or feet when you stand or walk. It is important to use crutches that fit  properly. When fitted properly:  Each crutch should be 2-3  finger widths below the armpit.  Your weight should be supported by your hand, and not by resting the armpit on the crutch.  RISKS AND COMPLICATIONS Damage to the nerves that extend from your armpit to your hand and arm. To prevent this from happening, make sure your crutches fit properly and do not put pressure on your armpit when using them. HOW TO USE YOUR CRUTCHES If you have been instructed to use partial weight bearing, apply (bear) the amount of weight as your health care provider suggests. Do not bear weight in an amount that causes pain to the area of injury. Walking 22. Step with the crutches. 23. Swing the healthy leg slightly ahead of the crutches. Going Up Steps If there is no handrail: 17. Step up with the healthy leg. 18. Step up with the crutches and injured leg. 19. Continue in this way. If there is a handrail: 21. Hold both crutches in one hand. 22. Place your free hand on the handrail. 23. While putting your weight on your arms, lift your healthy leg to the step. 24. Bring the crutches and the injured leg up to that step. 25. Continue in this way. Going Down Steps Be very careful, as going down stairs with crutches is very challenging. If there is no handrail: 13. Step down with the injured leg and crutches. 14. Step down with the healthy leg. If there is a handrail: 9. Place your hand on the handrail. 10. Hold both crutches with your free hand. 11. Lower your injured leg and crutch to the step below you. Make sure to keep the crutch tips in the center of the step, never on the edge. 12. Lower your healthy leg to that step. 13. Continue in this way. Standing Up 6. Hold the injured leg forward. 7. Grab the armrest with one hand and the top of the crutches with the other hand. 8. Using these supports, pull yourself up to a standing position. Sitting Down 1. Hold the injured leg forward. 2. Grab the  armrest with one hand and the top of the crutches with the other hand. 3. Lower yourself to a sitting position. SEEK MEDICAL CARE IF:  You still feel unsteady on your feet.  You develop new pain, for example in your armpits, back, shoulder, wrist, or hip.  You develop any numbness or tingling. SEEK IMMEDIATE MEDICAL CARE IF: You fall. Document Released: 08/25/2000 Document Revised: 09/02/2013 Document Reviewed: 05/05/2013 [facility] Patient Information 2015 [place], [place]. This information is not intended to replace advice given to you by your health care provider. Make sure you discuss any questions you have with your health care provider.
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
## 2015-01-22 NOTE — ED Notes (Signed)
Pt arrives via ems, states he was walking this am and stepped in a hole twisting his rt ankle and left knee. Patient attempted pain management with ice packs and motrin at home with no relief. Pt alert, oriented, nad.

## 2015-01-27 DIAGNOSIS — R3915 Urgency of urination: Secondary | ICD-10-CM | POA: Diagnosis not present

## 2015-01-27 DIAGNOSIS — N390 Urinary tract infection, site not specified: Secondary | ICD-10-CM | POA: Diagnosis not present

## 2015-01-27 DIAGNOSIS — R3912 Poor urinary stream: Secondary | ICD-10-CM | POA: Diagnosis not present

## 2015-02-09 DIAGNOSIS — Z992 Dependence on renal dialysis: Secondary | ICD-10-CM | POA: Diagnosis not present

## 2015-02-09 DIAGNOSIS — N186 End stage renal disease: Secondary | ICD-10-CM | POA: Diagnosis not present

## 2015-02-09 DIAGNOSIS — I120 Hypertensive chronic kidney disease with stage 5 chronic kidney disease or end stage renal disease: Secondary | ICD-10-CM | POA: Diagnosis not present

## 2015-02-19 DIAGNOSIS — N390 Urinary tract infection, site not specified: Secondary | ICD-10-CM | POA: Diagnosis not present

## 2015-02-19 DIAGNOSIS — R3912 Poor urinary stream: Secondary | ICD-10-CM | POA: Diagnosis not present

## 2015-02-19 DIAGNOSIS — R310 Gross hematuria: Secondary | ICD-10-CM | POA: Diagnosis not present

## 2015-02-19 DIAGNOSIS — R3915 Urgency of urination: Secondary | ICD-10-CM | POA: Diagnosis not present

## 2015-02-19 DIAGNOSIS — N359 Urethral stricture, unspecified: Secondary | ICD-10-CM | POA: Diagnosis not present

## 2015-02-24 ENCOUNTER — Telehealth: Payer: Self-pay | Admitting: *Deleted

## 2015-02-24 ENCOUNTER — Ambulatory Visit (INDEPENDENT_AMBULATORY_CARE_PROVIDER_SITE_OTHER): Payer: Medicare Other | Admitting: Podiatry

## 2015-02-24 ENCOUNTER — Ambulatory Visit (INDEPENDENT_AMBULATORY_CARE_PROVIDER_SITE_OTHER): Payer: Medicare Other

## 2015-02-24 ENCOUNTER — Encounter: Payer: Self-pay | Admitting: Podiatry

## 2015-02-24 VITALS — BP 157/79 | HR 82 | Resp 15

## 2015-02-24 DIAGNOSIS — G629 Polyneuropathy, unspecified: Secondary | ICD-10-CM | POA: Diagnosis not present

## 2015-02-24 DIAGNOSIS — I739 Peripheral vascular disease, unspecified: Secondary | ICD-10-CM | POA: Diagnosis not present

## 2015-02-24 DIAGNOSIS — M79671 Pain in right foot: Secondary | ICD-10-CM | POA: Diagnosis not present

## 2015-02-24 MED ORDER — CICLOPIROX 8 % EX SOLN: Freq: Every day | CUTANEOUS | Status: DC

## 2015-02-24 NOTE — Patient Instructions (Signed)

## 2015-02-24 NOTE — Progress Notes (Signed)
   Subjective:    Patient ID: Bryan Gilmore., male    DOB: [birth year], 64 y.o.   MRN: [id number]  HPI  64 year old male presents the office with complaints of bilateral foot pain, swelling, numbness and tingling. He states in ongoing for several months and has remained about the same. He states he has pain to his legs and he walks. He does have a history of ankle sprain to the right side approximately one month ago for which she was seen in the emergency department. He does have everted numbness and pain to his feeds did start prior to that injury. He is currently taking gabapentin. No other complaints at this time.  Review of Systems  All other systems reviewed and are negative.      Objective:   Physical Exam AAO 3, NAD DP/PT pulses decreased bilaterally, CRT less than 3 seconds Protective sensation decreased with Simms Weinstein monofilament, decreased vibratory sensation, Achilles tendon reflex intact. There is mild bilateral lower extremity edema present. There is no areas of pinpoint bony tenderness or pain the vibratory sensation to the foot/ankle bilaterally. Ankle joint range of motion is intact and without restriction or crepitation. Subtalar, midtarsal, MTPJ joint range of motions intact. MMT 5/5, ROM WNL.  No open lesions or pre-ulcerative lesions identified bilaterally. There is no pain with calf compression, Warmth, Erythema.       Assessment & Plan:  64 year old male with bilateral lower shape pain, numbness/tingling -X-rays were obtained and reviewed with the patient.  -Treatment options discussed including all alternatives, risks, and complications -At this time given his decreased pulses and vessel consultation x-ray will obtain vascular studies to see if some of his pain is result of PVD.  -Continue gabapentin. Discussed dosage increase. Recommended him to contact his PCP for this.  -Follow-up after vascular studies or sooner if any problems arise. In the meantime,  encouraged to call the office with any questions, concerns, change in symptoms.   Celesta Gentile, DPM

## 2015-02-24 NOTE — Telephone Encounter (Signed)
Dr. Jacqualyn Posey ordered lower extremity dopplers and ABI B/L due to numbness in toes and feet with shooting pain.

## 2015-03-01 ENCOUNTER — Other Ambulatory Visit: Payer: Self-pay | Admitting: Podiatry

## 2015-03-01 DIAGNOSIS — I739 Peripheral vascular disease, unspecified: Secondary | ICD-10-CM

## 2015-03-01 DIAGNOSIS — R200 Anesthesia of skin: Secondary | ICD-10-CM

## 2015-03-03 ENCOUNTER — Encounter (HOSPITAL_COMMUNITY): Payer: Medicare Other

## 2015-03-05 ENCOUNTER — Encounter (HOSPITAL_COMMUNITY): Payer: Medicare Other

## 2015-03-06 ENCOUNTER — Encounter: Payer: Self-pay | Admitting: Podiatry

## 2015-03-08 ENCOUNTER — Ambulatory Visit (HOSPITAL_COMMUNITY): Payer: Medicare Other | Attending: Podiatry

## 2015-03-08 DIAGNOSIS — R200 Anesthesia of skin: Secondary | ICD-10-CM | POA: Insufficient documentation

## 2015-03-08 DIAGNOSIS — I739 Peripheral vascular disease, unspecified: Secondary | ICD-10-CM

## 2015-03-08 DIAGNOSIS — I120 Hypertensive chronic kidney disease with stage 5 chronic kidney disease or end stage renal disease: Secondary | ICD-10-CM | POA: Diagnosis not present

## 2015-03-08 DIAGNOSIS — I700 Atherosclerosis of aorta: Secondary | ICD-10-CM | POA: Insufficient documentation

## 2015-03-08 DIAGNOSIS — N186 End stage renal disease: Secondary | ICD-10-CM | POA: Diagnosis not present

## 2015-03-11 ENCOUNTER — Other Ambulatory Visit: Payer: Self-pay | Admitting: Internal Medicine

## 2015-03-11 DIAGNOSIS — B20 Human immunodeficiency virus [HIV] disease: Secondary | ICD-10-CM

## 2015-03-11 MED ORDER — LAMIVUDINE 100 MG PO TABS: 50.0000 mg | ORAL_TABLET | Freq: Every day | ORAL | Status: DC

## 2015-03-14 ENCOUNTER — Other Ambulatory Visit: Payer: Self-pay | Admitting: Internal Medicine

## 2015-03-22 ENCOUNTER — Ambulatory Visit: Payer: Medicare Other | Admitting: Internal Medicine

## 2015-03-23 ENCOUNTER — Telehealth: Payer: Self-pay | Admitting: *Deleted

## 2015-03-23 NOTE — Telephone Encounter (Signed)
Dr. Jacqualyn Posey states he would like to discuss doppler results with pt.  I informed pt of the request and transferred to schedulers.

## 2015-03-26 ENCOUNTER — Ambulatory Visit: Payer: Medicare Other | Admitting: Podiatry

## 2015-03-31 ENCOUNTER — Encounter: Payer: Self-pay | Admitting: Internal Medicine

## 2015-03-31 ENCOUNTER — Ambulatory Visit (INDEPENDENT_AMBULATORY_CARE_PROVIDER_SITE_OTHER): Payer: Medicare Other | Admitting: Internal Medicine

## 2015-03-31 VITALS — BP 133/85 | HR 83 | Temp 97.6°F | Wt 221.0 lb

## 2015-03-31 DIAGNOSIS — B20 Human immunodeficiency virus [HIV] disease: Secondary | ICD-10-CM | POA: Diagnosis present

## 2015-03-31 DIAGNOSIS — B182 Chronic viral hepatitis C: Secondary | ICD-10-CM

## 2015-03-31 DIAGNOSIS — N186 End stage renal disease: Secondary | ICD-10-CM

## 2015-03-31 LAB — CBC WITH DIFFERENTIAL/PLATELET
Basophils Absolute: 0 10*3/uL (ref 0.0–0.1)
Basophils Relative: 1 % (ref 0–1)
Eosinophils Absolute: 0 10*3/uL (ref 0.0–0.7)
Eosinophils Relative: 1 % (ref 0–5)
HCT: 36.1 % — ABNORMAL LOW (ref 39.0–52.0)
Hemoglobin: 12.1 g/dL — ABNORMAL LOW (ref 13.0–17.0)
LYMPHS PCT: 33 % (ref 12–46)
Lymphs Abs: 1.5 10*3/uL (ref 0.7–4.0)
MCH: 35.6 pg — ABNORMAL HIGH (ref 26.0–34.0)
MCHC: 33.5 g/dL (ref 30.0–36.0)
MCV: 106.2 fL — ABNORMAL HIGH (ref 78.0–100.0)
MONO ABS: 0.5 10*3/uL (ref 0.1–1.0)
MPV: 9 fL (ref 8.6–12.4)
Monocytes Relative: 12 % (ref 3–12)
Neutro Abs: 2.4 10*3/uL (ref 1.7–7.7)
Neutrophils Relative %: 53 % (ref 43–77)
PLATELETS: 210 10*3/uL (ref 150–400)
RBC: 3.4 MIL/uL — ABNORMAL LOW (ref 4.22–5.81)
RDW: 16.7 % — AB (ref 11.5–15.5)
WBC: 4.5 10*3/uL (ref 4.0–10.5)

## 2015-03-31 LAB — COMPLETE METABOLIC PANEL WITH GFR
ALT: 11 U/L (ref 0–53)
AST: 13 U/L (ref 0–37)
Albumin: 4 g/dL (ref 3.5–5.2)
Alkaline Phosphatase: 63 U/L (ref 39–117)
BUN: 37 mg/dL — ABNORMAL HIGH (ref 6–23)
CO2: 30 mEq/L (ref 19–32)
CREATININE: 8.41 mg/dL — AB (ref 0.50–1.35)
Calcium: 10.3 mg/dL (ref 8.4–10.5)
Chloride: 96 mEq/L (ref 96–112)
GFR, EST AFRICAN AMERICAN: 7 mL/min — AB
GFR, Est Non African American: 6 mL/min — ABNORMAL LOW
Glucose, Bld: 82 mg/dL (ref 70–99)
Potassium: 3.8 mEq/L (ref 3.5–5.3)
Sodium: 141 mEq/L (ref 135–145)
Total Bilirubin: 0.7 mg/dL (ref 0.2–1.2)
Total Protein: 8.1 g/dL (ref 6.0–8.3)

## 2015-03-31 NOTE — Progress Notes (Signed)
Patient ID: Bryan Gilmore., male   DOB: Feb 16, 1951, 64 y.o.   MRN: [id number]       Patient ID: Bryan Gilmore., male   DOB: 01/12/1951, 64 y.o.   MRN: [id number]  HPI 64yo M with well controlled HIV disease. Treated for chronic hep C in Mar 2016. ESRD on HD t-th-Sat. He has recovered from abdominal hernia surgery. Well healed incision. Doing well overall. Doesn't tolerate being on dialysis for full length and feels better if he cuts his session short by 15-20 min  Outpatient Encounter Prescriptions as of 03/31/2015  Medication Sig  . allopurinol (ZYLOPRIM) 300 MG tablet TAKE ONE TABLET BY MOUTH ONCE DAILY FOR GOUT  . aspirin EC 325 MG tablet Take 1 tablet (325 mg total) by mouth daily.  . carvedilol (COREG) 6.25 MG tablet Take 1 tablet (6.25 mg total) by mouth 2 (two) times daily.  . ciclopirox (PENLAC) 8 % solution Apply topically at bedtime. Apply over nail and surrounding skin. Apply daily over previous coat. After seven (7) days, may remove with alcohol and continue cycle.  . darunavir-cobicistat (PREZCOBIX) 800-150 MG per tablet Take 1 tablet by mouth daily. Swallow whole. Take with food. Please D/C Prezista and Norvir.  Marland Kitchen gabapentin (NEURONTIN) 100 MG capsule TAKE ONE CAPSULE BY MOUTH THREE TIMES DAILY  . hydrALAZINE (APRESOLINE) 25 MG tablet Take 3 tablets (75 mg total) by mouth every 8 (eight) hours.  . ISENTRESS 400 MG tablet TAKE ONE TABLET BY MOUTH TWICE DAILY  . lamivudine (EPIVIR) 100 MG tablet Take 0.5 tablets (50 mg total) by mouth daily.  Marland Kitchen lanthanum (FOSRENOL) 1000 MG chewable tablet Chew 1,000 mg by mouth 3 (three) times daily with meals.   Marland Kitchen levothyroxine (SYNTHROID, LEVOTHROID) 50 MCG tablet Take 1 tablet (50 mcg total) by mouth daily. (Patient taking differently: Take 50 mcg by mouth daily before breakfast. )  . oxyCODONE-acetaminophen (PERCOCET/ROXICET) 5-325 MG per tablet Take 1 tablet by mouth every 6 (six) hours as needed for moderate pain.  . zidovudine (RETROVIR) 300 MG  tablet TAKE ONE TABLET BY MOUTH EVERY DAY   No facility-administered encounter medications on file as of 03/31/2015.     Patient Active Problem List   Diagnosis Date Noted  . Hypertensive urgency 12/21/2014  . Hypothyroid 12/21/2014  . Chronic hepatitis C without hepatic coma 11/25/2014  . Ventral incisional hernia 10/12/2014  . Thyroid activity decreased 07/29/2014  . Essential hypertension 06/24/2014  . Floaters in visual field 06/24/2014  . Atrial fibrillation, unspecified 06/24/2014  . Other specified abdominal hernia without obstruction or gangrene 06/24/2014  . Esophageal reflux 06/24/2014  . Annual physical exam 06/24/2014  . Gynecomastia 11/12/2013  . Other complications due to renal dialysis device, implant, and graft 01/29/2013  . Colostomy status 12/26/2012  . Aftercare following surgery of the circulatory system, [place] 12/18/2012  . End stage renal disease 10/30/2012  . Alcohol abuse   . Atrial fibrillation, permanent 04/04/2012  . Fever 04/01/2012  . Hyperkalemia 03/29/2012  . Secondary hyperparathyroidism (of renal origin) 03/29/2012  . ESRD (end stage renal disease) 02/29/2012  . Anemia of chronic renal failure 02/29/2012  . Normocytic anemia 02/17/2012  . Acute hepatitis C virus infection 10/07/2010  . Chronic gouty arthropathy 10/07/2010  . 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 Human immunodeficiency virus (HIV) disease 09/15/2010     Health Maintenance Due  Topic Date Due  . TETANUS/TDAP  11/18/1969  . COLONOSCOPY  11/18/2000  . ZOSTAVAX  11/19/2010     Review  of Systems 10 point ros is negative, except what is mentioned in hpi Physical Exam   BP 133/85 mmHg  Pulse 83  Temp(Src) 97.6 F (36.4 C) (Oral)  Wt 221 lb (100.245 kg) Physical Exam  Constitutional: He is oriented to person, place, and time. He appears well-developed and well-nourished. No distress.  HENT:  Mouth/Throat: Oropharynx is clear and moist. No oropharyngeal exudate.    Cardiovascular: Normal rate, regular rhythm and normal heart sounds. Exam reveals no gallop and no friction rub.  No murmur heard.  Pulmonary/Chest: Effort normal and breath sounds normal. No respiratory distress. He has no wheezes.  Abdominal: Soft. Bowel sounds are normal. He exhibits no distension. There is no tenderness. Well healed incision Lymphadenopathy:  He has no cervical adenopathy.     Lab Results  Component Value Date   CD4TCELL 36 11/11/2014   Lab Results  Component Value Date   CD4TABS 460 11/11/2014   CD4TABS 650 07/20/2014   CD4TABS 510 03/25/2014   Lab Results  Component Value Date   HIV1RNAQUANT <20 11/11/2014   Lab Results  Component Value Date   HEPBSAB NEGATIVE 02/18/2012   No results found for: RPR  CBC Lab Results  Component Value Date   WBC 4.3 12/22/2014   RBC 3.05* 12/22/2014   HGB 10.0* 12/22/2014   HCT 32.0* 12/22/2014   PLT 151 12/22/2014   MCV 104.9* 12/22/2014   MCH 32.8 12/22/2014   MCHC 31.3 12/22/2014   RDW 15.4 12/22/2014   LYMPHSABS 1.6 12/21/2014   MONOABS 0.3 12/21/2014   EOSABS 0.1 12/21/2014   BASOSABS 0.0 12/21/2014   BMET Lab Results  Component Value Date   NA 134* 12/23/2014   K 4.1 12/23/2014   CL 100 12/23/2014   CO2 23 12/23/2014   GLUCOSE 85 12/23/2014   BUN 15 12/23/2014   CREATININE 5.90* 12/23/2014   CALCIUM 9.6 12/23/2014   GFRNONAA 9* 12/23/2014   GFRAA 11* 12/23/2014     Assessment and Plan  hiv disease = well controled. Will check hiv labs today. Continue on prezcobix/RLG bid/lamivudine/zidovudine regimen. Now has been on 2nd month since switching from [facility] to [facility]  Chronic hep c without hepatic coma, but has cirrhosis = had egd by [facility] that reportedly normal last month  esrd = he feels that he can tolerate 3 1/2 hrs of HD that doesn't cause him excessive fatigue  Abdominal hernia repair = well healed has some slight tenderness. Continue to observe.

## 2015-04-01 ENCOUNTER — Telehealth: Payer: Self-pay | Admitting: *Deleted

## 2015-04-01 NOTE — Telephone Encounter (Signed)
Call from [facility] lab on alert creatinine of 8.41. Patient is on dialysis, results faxed and put in Dr. Storm Frisk box. Myrtis Hopping

## 2015-04-02 LAB — T-HELPER CELL (CD4) - (RCID CLINIC ONLY)
CD4 % Helper T Cell: 30 % — ABNORMAL LOW (ref 33–55)
CD4 T CELL ABS: 500 /uL (ref 400–2700)

## 2015-04-02 LAB — HIV-1 RNA QUANT-NO REFLEX-BLD
HIV 1 RNA QUANT: 124 {copies}/mL — AB (ref <20)
HIV-1 RNA Quant, Log: 2.09 {Log} — ABNORMAL HIGH (ref <1.30)

## 2015-04-12 ENCOUNTER — Ambulatory Visit (INDEPENDENT_AMBULATORY_CARE_PROVIDER_SITE_OTHER): Payer: Medicare Other | Admitting: Podiatry

## 2015-04-12 ENCOUNTER — Ambulatory Visit: Payer: Medicare Other | Admitting: Nurse Practitioner

## 2015-04-12 ENCOUNTER — Encounter: Payer: Self-pay | Admitting: Podiatry

## 2015-04-12 VITALS — BP 130/60 | HR 74 | Resp 12

## 2015-04-12 DIAGNOSIS — G629 Polyneuropathy, unspecified: Secondary | ICD-10-CM

## 2015-04-12 NOTE — Progress Notes (Signed)
Patient ID: Melchor Kirchgessner., male   DOB: [birth year], 64 y.o.   MRN: [id number]  Subjective: 64 year old male presents the office if all evaluation of bilateral foot pain, numbness and tingling. His last appointment he did have the fascia studies performed. He also continues 100 mg of gabapentin 3 times a day without nodes seen much change. He states he has been on gabapentin for years  without any dosage change. He denies any swelling or redness. Denies a history of injury or trauma. No other complaints at this time. Denies any systemic complaints as fevers, chills, nausea, vomiting.  Objective: AAO 3, NAD  DP/PT pulses slightly decreased bilaterally, CRT less than 3 seconds Protective sensation decreased with Sims once the monofilament, Achilles tendon reflex intact There is no areas of pinpoint bony tenderness or pain the vibratory sensation of bilateral lower extremities. Mild HAV deformity is present bilaterally. There is no overlying edema, erythema, increase in warmth. No open lesions or pre-ulcerative lesions. No pain with calf compression, swelling, warmth, erythema.  Assessment: 64 year old male with neuropathy  Plan: -Treatment options discussed including all alternatives, risks, and complications -Discussed the patient to slowly increase his gabapentin. I discussed in how to do this. At this any side effects the medication to hold off on an increase in call the office. I discussed him to slowly increase up to 300 mg 3 times a day or until he notices relief. -Fascia studies discussed the patient. Overall he had normal great toe pressures in normal toe waveforms. There was no evidence of segmental  arterial disease at rest. -Follow-up 6 weeks or sooner if any problems arise. In the meantime, encouraged to call the office with any questions, concerns, change in symptoms.   Celesta Gentile, DPM

## 2015-04-13 ENCOUNTER — Ambulatory Visit: Payer: Medicare Other | Admitting: Nurse Practitioner

## 2015-04-14 ENCOUNTER — Ambulatory Visit: Payer: Medicare Other

## 2015-04-21 ENCOUNTER — Encounter: Payer: Self-pay | Admitting: Internal Medicine

## 2015-04-21 ENCOUNTER — Ambulatory Visit (INDEPENDENT_AMBULATORY_CARE_PROVIDER_SITE_OTHER): Payer: Medicare Other | Admitting: Internal Medicine

## 2015-04-21 VITALS — BP 130/86 | HR 72 | Temp 98.0°F | Resp 20 | Ht 71.0 in | Wt 223.6 lb

## 2015-04-21 DIAGNOSIS — R2242 Localized swelling, mass and lump, left lower limb: Secondary | ICD-10-CM

## 2015-04-21 DIAGNOSIS — B20 Human immunodeficiency virus [HIV] disease: Secondary | ICD-10-CM | POA: Diagnosis not present

## 2015-04-21 DIAGNOSIS — K219 Gastro-esophageal reflux disease without esophagitis: Secondary | ICD-10-CM

## 2015-04-21 DIAGNOSIS — I10 Essential (primary) hypertension: Secondary | ICD-10-CM

## 2015-04-21 DIAGNOSIS — Z9889 Other specified postprocedural states: Secondary | ICD-10-CM | POA: Diagnosis not present

## 2015-04-21 DIAGNOSIS — N186 End stage renal disease: Secondary | ICD-10-CM

## 2015-04-21 DIAGNOSIS — E039 Hypothyroidism, unspecified: Secondary | ICD-10-CM | POA: Diagnosis not present

## 2015-04-21 DIAGNOSIS — I4891 Unspecified atrial fibrillation: Secondary | ICD-10-CM

## 2015-04-21 DIAGNOSIS — Z8719 Personal history of other diseases of the digestive system: Secondary | ICD-10-CM

## 2015-04-21 NOTE — Progress Notes (Signed)
Patient ID: Bryan Gilmore., male   DOB: Jan 16, 1951, 63 y.o.   MRN: [id number]    Location:     [facility]   Place of Service:   OFFICE  Chief Complaint  Patient presents with  . Medical Management of Chronic Issues    4 month follow-up    HPI:  64 yo male seen today for f/u He has fallen since last OV. He fell into hole with right foot after stepping off step at home. He sprain ankle but it is improving. He now c/o left lateral foot pain and noticed a sore forming along metatarsal. He was seen by podiatry last week but nothing seen. He has a hx pressure sores. 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 Takes fosrenal  Gout - no attacks since his last visit  HIV - followed by Dr Baxter Flattery. CD4 in 500s last week. He is on 4 HAART meds  Hypothyroidism - no cold intolerance/palpitations. He takes levothyroxine  Chronic pain - takes gabapentin and percocet   Past Medical History  Diagnosis Date  . HIV (human immunodeficiency virus infection)   . Sepsis(995.91)   . Gout   . Fournier's gangrene   . Severe protein-calorie malnutrition   . Alcohol abuse   . Dysrhythmia     AFib, not a candidate for coumadin , dr [name] signed off on pt  . Hypertension   . Colostomy status   . Sebaceous cyst   . Cough   . Unspecified constipation   . GERD (gastroesophageal reflux disease)   . Atrial fibrillation   . Ulcer of lower limb, unspecified   . Ulcer of lower limb, unspecified   . Other severe protein-calorie malnutrition   . Disorders of phosphorus metabolism   . Hypopotassemia   . Altered mental status   . Hemoptysis, unspecified   . Acute respiratory failure   . Gangrene   . Sepsis(995.91)   . Peripheral vascular disease, unspecified     denies  . Hypothyroidism   . Myocardial  infarction     "told by a dr I had evidence of one that I didn't know about" (10/12/2014)  . Anemia   . Hepatitis C     "I've been treated for all of them" (10/12/2014)  . Hepatitis A   . Hepatitis B   . Gouty arthritis   . Arthritis     "joints" (10/12/2014)  . ESRD (end stage renal disease) on dialysis     "TTS; [facility] " (10/12/2014)  . End stage renal disease   . Anemia in chronic kidney disease(285.21)     Past Surgical History  Procedure Laterality Date  . Cystoscopy with urethral dilatation  02/17/2012    Procedure: CYSTOSCOPY WITH URETHRAL DILATATION;  Surgeon: Reece Packer, MD;  Location: [facility];  Service: Urology;  Laterality: N/A;  retrograde urethragram, placement of foley catheter exam under anesthesia   . Insertion of dialysis catheter  02/20/2012    Procedure: INSERTION OF DIALYSIS CATHETER;  Surgeon: Angelia Mould, MD;  Location: [facility] OR;  Service: Vascular;  Laterality: N/A;  . Irrigation and debridement abscess  02/29/2012    Procedure: IRRIGATION AND DEBRIDEMENT ABSCESS;  Surgeon: Reece Packer, MD;  Location: [place];  Service:  Urology;  Laterality: N/A;  I&D of scrotum abcess  . Insertion of dialysis catheter  03/29/2012    Procedure: INSERTION OF DIALYSIS CATHETER;  Surgeon: Rosetta Posner, MD;  Location: [facility] OR;  Service: Vascular;  Laterality: N/A;  Insertion tunneled dialysis catheter right IJ  . Av fistula placement  04/12/2012    Procedure: INSERTION OF ARTERIOVENOUS (AV) GORE-TEX GRAFT ARM;  Surgeon: Rosetta Posner, MD;  Location: [facility] OR;  Service: Vascular;  Laterality: Left;  insertion of 6x50 stretch goretex graft   . Thrombectomy and revision of arterioventous (av) goretex  graft  07/15/2012    Procedure: THROMBECTOMY AND REVISION OF ARTERIOVENTOUS (AV) GORETEX  GRAFT;  Surgeon: Elam Dutch, MD;  Location: [place];  Service: Vascular;  Laterality: Left;  . Urethrogram  07/30/2012    Procedure: URETHROGRAM;  Surgeon: Reece Packer, MD;   Location: [facility];  Service: Urology;  Laterality: N/A;  cystogram  . Cystoscopy  07/30/2012    Procedure: CYSTOSCOPY;  Surgeon: Reece Packer, MD;  Location: [facility];  Service: Urology;  Laterality: Bilateral;  retrograde  . Insertion of dialysis catheter  08/04/2012    Procedure: INSERTION OF DIALYSIS CATHETER;  Surgeon: Angelia Mould, MD;  Location: [place];  Service: Vascular;  Laterality: Right;  right intrajugular hemodialysis catheter insertion  . Av fistula placement  08/14/2012    Procedure: INSERTION OF ARTERIOVENOUS (AV) GORE-TEX GRAFT ARM;  Surgeon: Mal Misty, MD;  Location: [place];  Service: Vascular;  Laterality: Left;  . Colostomy  02/2012    d/t an infection  . Av fistula placement Right 11/08/2012    Procedure: ARTERIOVENOUS (AV) FISTULA CREATION;  Surgeon: Angelia Mould, MD;  Location: [place];  Service: Vascular;  Laterality: Right;  . Surgery for fournier's gangrene  02/2012  . Colostomy  13  . Colostomy closure N/A 02/21/2013    Procedure: COLOSTOMY CLOSURE;  Surgeon: [name], MD;  Location: [place];  Service: General;  Laterality: N/A;  takedown of sigmoid colostomy with anastomosis  . Bowel resection N/A 02/21/2013    Procedure: SMALL BOWEL RESECTION;  Surgeon: [name], MD;  Location: [place];  Service: General;  Laterality: N/A;  . Fistulogram Right 01/06/2013    Procedure: FISTULOGRAM;  Surgeon: Angelia Mould, MD;  Location: [facility] CATH LAB;  Service: Cardiovascular;  Laterality: Right;  . Ventral hernia repair  10/12/2014  . Hernia repair    . Ventral hernia repair N/A 10/12/2014    Procedure: REPAIR VENTRAL INCISIONAL HERNIA;  Surgeon: Excell Seltzer, MD;  Location: [place];  Service: General;  Laterality: N/A;  . Insertion of mesh N/A 10/12/2014    Procedure: INSERTION OF MESH;  Surgeon: Excell Seltzer, MD;  Location: [place];  Service: General;  Laterality: N/A;    Patient Care Team: Gildardo Cranker, DO as PCP - General (Internal  Medicine) Thayer Headings, MD as PCP - Infectious Diseases (Infectious Diseases) Minda Ditto, Rad Tech as Technician Corliss Parish, MD as Consulting Physician (Nephrology)  Social History   Social History  . Marital Status: Married    Spouse Name: N/A  . Number of Children: 0  . Years of Education: N/A   Occupational History  . [employer]   Social History Main Topics  . Smoking status: Never Smoker   . Smokeless tobacco: Never Used  . Alcohol Use: No  . Drug Use: No  . Sexual Activity: No     Comment:  declined condoms   Other Topics Concern  . Not on file   Social History Narrative   Single.  Unemployed.  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       reports that he has never smoked. He has never used smokeless tobacco. He reports that he does not drink alcohol or use illicit drugs.  No Known Allergies  Medications: Patient's Medications  New Prescriptions   No medications on file  Previous Medications   ALLOPURINOL (ZYLOPRIM) 300 MG TABLET    TAKE ONE TABLET BY MOUTH ONCE DAILY FOR GOUT   ASPIRIN EC 325 MG TABLET    Take 1 tablet (325 mg total) by mouth daily.   CARVEDILOL (COREG) 6.25 MG TABLET    Take 1 tablet (6.25 mg total) by mouth 2 (two) times daily.   CICLOPIROX (PENLAC) 8 % SOLUTION    Apply topically at bedtime. Apply over nail and surrounding skin. Apply daily over previous coat. After seven (7) days, may remove with alcohol and continue cycle.   DARUNAVIR-COBICISTAT (PREZCOBIX) 800-150 MG PER TABLET    Take 1 tablet by mouth daily. Swallow whole. Take with food. Please D/C Prezista and Norvir.   GABAPENTIN (NEURONTIN) 100 MG CAPSULE    TAKE ONE CAPSULE BY MOUTH THREE TIMES DAILY   HYDRALAZINE (APRESOLINE) 25 MG TABLET    Take 3 tablets (75 mg total) by mouth every 8 (eight) hours.   ISENTRESS 400 MG TABLET    TAKE ONE TABLET BY MOUTH TWICE DAILY   LAMIVUDINE (EPIVIR) 100 MG TABLET    Take 0.5 tablets (50 mg total) by mouth  daily.   LANTHANUM (FOSRENOL) 1000 MG CHEWABLE TABLET    Chew 1,000 mg by mouth 3 (three) times daily with meals.    LEVOTHYROXINE (SYNTHROID, LEVOTHROID) 50 MCG TABLET    Take 1 tablet (50 mcg total) by mouth daily.   OXYCODONE-ACETAMINOPHEN (PERCOCET/ROXICET) 5-325 MG PER TABLET    Take 1 tablet by mouth every 6 (six) hours as needed for moderate pain.   ZIDOVUDINE (RETROVIR) 300 MG TABLET    TAKE ONE TABLET BY MOUTH EVERY DAY  Modified Medications   No medications on file  Discontinued Medications   No medications on file    Review of Systems  Constitutional: Negative for chills, activity change and fatigue.  HENT: Positive for congestion. Negative for postnasal drip, sore throat and trouble swallowing.   Eyes: Negative for visual disturbance.  Respiratory: Negative for cough, chest tightness and shortness of breath.   Cardiovascular: Negative for chest pain, palpitations and leg swelling.  Gastrointestinal: Positive for abdominal pain. Negative for nausea, vomiting and blood in stool.  Genitourinary: Negative for urgency, frequency and difficulty urinating.  Musculoskeletal: Positive for arthralgias and gait problem.  Skin: Positive for wound. Negative for rash.  Neurological: Negative for weakness and headaches.  Psychiatric/Behavioral: Negative for confusion and sleep disturbance. The patient is not nervous/anxious.     Filed Vitals:   04/21/15 1125  BP: 130/86  Pulse: 72  Temp: 98 F (36.7 C)  TempSrc: Oral  Resp: 20  Height: '5\' 11"'  (1.803 m)  Weight: 223 lb 9.6 oz (101.424 kg)  SpO2: 98%   Body mass index is 31.2 kg/(m^2).  Physical Exam  Constitutional: He is oriented to person, place, and time. He appears well-developed and well-nourished. No distress.  HENT:  Mouth/Throat: Oropharynx is clear and moist.  Eyes: Pupils are equal, round, and reactive to light. No scleral icterus.  Neck: Neck  supple. Carotid bruit is not present. No thyromegaly present.    Cardiovascular: Normal rate, regular rhythm, normal heart sounds and intact distal pulses.  Exam reveals no gallop and no friction rub.   No murmur heard. no distal LE swelling. No calf TTP  Pulmonary/Chest: Effort normal and breath sounds normal. He has no wheezes. He has no rales. He exhibits no tenderness.  Abdominal: Soft. Bowel sounds are normal. He exhibits no distension, no abdominal bruit, no pulsatile midline mass and no mass. There is tenderness. There is no rebound (epigastic) and no guarding.  Musculoskeletal: He exhibits edema and tenderness.  Lymphadenopathy:    He has no cervical adenopathy.  Neurological: He is alert and oriented to person, place, and time. He has normal reflexes.  Skin: Skin is warm and dry. Lesion (raised, TTP lump left 5th lateral metatarsal. no bleeding or d/c. eschar noted) noted. No abrasion, no bruising and no rash noted.     Psychiatric: He has a normal mood and affect. His behavior is normal. Thought content normal.     Labs reviewed: Office Visit on 03/31/2015  Component Date Value Ref Range Status  . WBC 03/31/2015 4.5  4.0 - 10.5 K/uL Final  . RBC 03/31/2015 3.40* 4.22 - 5.81 MIL/uL Final  . Hemoglobin 03/31/2015 12.1* 13.0 - 17.0 g/dL Final  . HCT 03/31/2015 36.1* 39.0 - 52.0 % Final  . MCV 03/31/2015 106.2* 78.0 - 100.0 fL Final  . MCH 03/31/2015 35.6* 26.0 - 34.0 pg Final  . MCHC 03/31/2015 33.5  30.0 - 36.0 g/dL Final  . RDW 03/31/2015 16.7* 11.5 - 15.5 % Final  . Platelets 03/31/2015 210  150 - 400 K/uL Final  . MPV 03/31/2015 9.0  8.6 - 12.4 fL Final  . Neutrophils Relative % 03/31/2015 53  43 - 77 % Final  . Neutro Abs 03/31/2015 2.4  1.7 - 7.7 K/uL Final  . Lymphocytes Relative 03/31/2015 33  12 - 46 % Final  . Lymphs Abs 03/31/2015 1.5  0.7 - 4.0 K/uL Final  . Monocytes Relative 03/31/2015 12  3 - 12 % Final  . Monocytes Absolute 03/31/2015 0.5  0.1 - 1.0 K/uL Final  . Eosinophils Relative 03/31/2015 1  0 - 5 % Final  .  Eosinophils Absolute 03/31/2015 0.0  0.0 - 0.7 K/uL Final  . Basophils Relative 03/31/2015 1  0 - 1 % Final  . Basophils Absolute 03/31/2015 0.0  0.0 - 0.1 K/uL Final  . Smear Review 03/31/2015 Criteria for review not met   Final  . Sodium 03/31/2015 141  135 - 145 mEq/L Final  . Potassium 03/31/2015 3.8  3.5 - 5.3 mEq/L Final  . Chloride 03/31/2015 96  96 - 112 mEq/L Final  . CO2 03/31/2015 30  19 - 32 mEq/L Final  . Glucose, Bld 03/31/2015 82  70 - 99 mg/dL Final  . BUN 03/31/2015 37* 6 - 23 mg/dL Final  . Creat 03/31/2015 8.41* 0.50 - 1.35 mg/dL Final   Comment: Result repeated and verified. No visible hemolysis.   . Total Bilirubin 03/31/2015 0.7  0.2 - 1.2 mg/dL Final  . Alkaline Phosphatase 03/31/2015 63  39 - 117 U/L Final  . AST 03/31/2015 13  0 - 37 U/L Final  . ALT 03/31/2015 11  0 - 53 U/L Final  . Total Protein 03/31/2015 8.1  6.0 - 8.3 g/dL Final  . Albumin 03/31/2015 4.0  3.5 - 5.2 g/dL Final  . Calcium 03/31/2015 10.3  8.4 - 10.5  mg/dL Final  . GFR, Est African American 03/31/2015 7*  Final  . GFR, Est Non African American 03/31/2015 6*  Final   Comment:   The estimated GFR is a calculation valid for adults (>=73 years old) that uses the CKD-EPI algorithm to adjust for age and sex. It is   not to be used for children, pregnant women, hospitalized patients,    patients on dialysis, or with rapidly changing kidney function. According to the NKDEP, eGFR >89 is normal, 60-89 shows mild impairment, 30-59 shows moderate impairment, 15-29 shows severe impairment and <15 is ESRD.     Marland Kitchen HIV 1 RNA Quant 03/31/2015 124* <20 copies/mL Final  . HIV1 RNA Quant, Log 03/31/2015 2.09* <1.30 log 10 Final   Comment:   This test utilizes the [place] FDA approved Roche HIV-1 Test Kit by RT-PCR.     . CD4 T Cell Abs 03/31/2015 500  400 - 2700 /uL Final  . CD4 % Helper T Cell 03/31/2015 30* 33 - 55 % Final   Performed at [facility]    No results  found.   Assessment/Plan   ICD-9-CM ICD-10-CM   1. Lump of skin of lower extremity, left - possible early pressure sore 782.2 R22.42   2. End stage renal disease/HD TThSa via right AVF forearm 585.6 N18.6   3. Essential hypertension - stable 401.9 I10   4. Hypothyroidism, unspecified hypothyroidism type - stable 244.9 E03.9   5. Atrial fibrillation, unspecified - rate controlled 427.31 I48.91   6. Gastroesophageal reflux disease, esophagitis presence not specified - stable 530.81 K21.9   7. S/P repair of ventral hernia - no dehescience V45.89 Z98.89   8. Human immunodeficiency virus (HIV) disease - stable 042 B20     May need antibiotic if left foot sore worsens  Follow up with specialists as scheduled  Continue current medications as ordered  Follow up in 4 mos for routine visit  Harleen Fineberg S. Perlie Gold  [facility] [address] [place], [place] [address] [phone] Cell (Monday-Friday 8 AM - 5 PM) [phone] After 5 PM and follow prompts

## 2015-04-21 NOTE — Patient Instructions (Addendum)
May need antibiotic if left foot sore worsens  Follow up with specialists as scheduled  Continue current medications as ordered  Follow up in 4 mos for routine visit

## 2015-05-05 ENCOUNTER — Encounter: Payer: Self-pay | Admitting: Internal Medicine

## 2015-05-05 ENCOUNTER — Ambulatory Visit (INDEPENDENT_AMBULATORY_CARE_PROVIDER_SITE_OTHER): Payer: Medicare Other | Admitting: Internal Medicine

## 2015-05-05 VITALS — BP 120/78 | HR 78 | Temp 97.7°F | Resp 20 | Ht 71.0 in | Wt 221.8 lb

## 2015-05-05 DIAGNOSIS — B356 Tinea cruris: Secondary | ICD-10-CM | POA: Diagnosis not present

## 2015-05-05 DIAGNOSIS — N186 End stage renal disease: Secondary | ICD-10-CM | POA: Diagnosis not present

## 2015-05-05 DIAGNOSIS — R2242 Localized swelling, mass and lump, left lower limb: Secondary | ICD-10-CM

## 2015-05-05 DIAGNOSIS — R300 Dysuria: Secondary | ICD-10-CM

## 2015-05-05 DIAGNOSIS — M7071 Other bursitis of hip, right hip: Secondary | ICD-10-CM

## 2015-05-05 MED ORDER — NYSTATIN 100000 UNIT/GM EX CREA: 1.0000 "application " | TOPICAL_CREAM | Freq: Two times a day (BID) | CUTANEOUS | Status: DC

## 2015-05-05 NOTE — Progress Notes (Signed)
Patient ID: Bryan Gilmore., male   DOB: [birth year], 64 y.o.   MRN: [id number]    Location:     [facility]   Place of Service:  OFFICE   Chief Complaint  Patient presents with  . Acute Visit    Feet swelling, ? about infections, pressure sore on left leg.    HPI:  64 yo male seen today for left foot pain and swelling in same area as before. He has seen podiatry a few weeks ago but no acute changes noted. He reports pain in foot with ambulation.  He c/o arthralgias and myalgias. He has noticed right thigh pain when he squeezes it but no other time.   He also c/o "groin infection" that is intensely pruritic and occasionally draws blood. He has had similar infections in the past.  He has dysuria x 2-3 weeks and feels like he is straining. Urine appears cloudy. He is ESRD/HD TThSa.  Past Medical History  Diagnosis Date  . HIV (human immunodeficiency virus infection)   . Sepsis(995.91)   . Gout   . Fournier's gangrene   . Severe protein-calorie malnutrition   . Alcohol abuse   . Dysrhythmia     AFib, not a candidate for coumadin , dr [name] signed off on pt  . Hypertension   . Colostomy status   . Sebaceous cyst   . Cough   . Unspecified constipation   . GERD (gastroesophageal reflux disease)   . Atrial fibrillation   . Ulcer of lower limb, unspecified   . Ulcer of lower limb, unspecified   . Other severe protein-calorie malnutrition   . Disorders of phosphorus metabolism   . Hypopotassemia   . Altered mental status   . Hemoptysis, unspecified   . Acute respiratory failure   . Gangrene   . Sepsis(995.91)   . Peripheral vascular disease, unspecified     denies  . Hypothyroidism   . Myocardial infarction     "told by a dr I had evidence of one that I didn't know about" (10/12/2014)  . Anemia   . Hepatitis C     "I've been treated for all of them" (10/12/2014)  . Hepatitis A   . Hepatitis B   . Gouty arthritis   . Arthritis     "joints" (10/12/2014)  . ESRD (end stage renal  disease) on dialysis     "TTS; [facility] " (10/12/2014)  . End stage renal disease   . Anemia in chronic kidney disease(285.21)     Past Surgical History  Procedure Laterality Date  . Cystoscopy with urethral dilatation  02/17/2012    Procedure: CYSTOSCOPY WITH URETHRAL DILATATION;  Surgeon: Reece Packer, MD;  Location: [facility];  Service: Urology;  Laterality: N/A;  retrograde urethragram, placement of foley catheter exam under anesthesia   . Insertion of dialysis catheter  02/20/2012    Procedure: INSERTION OF DIALYSIS CATHETER;  Surgeon: Angelia Mould, MD;  Location: [facility] OR;  Service: Vascular;  Laterality: N/A;  . Irrigation and debridement abscess  02/29/2012    Procedure: IRRIGATION AND DEBRIDEMENT ABSCESS;  Surgeon: Reece Packer, MD;  Location: [place];  Service: Urology;  Laterality: N/A;  I&D of scrotum abcess  . Insertion of dialysis catheter  03/29/2012    Procedure: INSERTION OF DIALYSIS CATHETER;  Surgeon: Rosetta Posner, MD;  Location: [facility] OR;  Service: Vascular;  Laterality: N/A;  Insertion tunneled dialysis catheter right IJ  . Av fistula placement  04/12/2012  Procedure: INSERTION OF ARTERIOVENOUS (AV) GORE-TEX GRAFT ARM;  Surgeon: Rosetta Posner, MD;  Location: [facility] OR;  Service: Vascular;  Laterality: Left;  insertion of 6x50 stretch goretex graft   . Thrombectomy and revision of arterioventous (av) goretex  graft  07/15/2012    Procedure: THROMBECTOMY AND REVISION OF ARTERIOVENTOUS (AV) GORETEX  GRAFT;  Surgeon: Elam Dutch, MD;  Location: [place];  Service: Vascular;  Laterality: Left;  . Urethrogram  07/30/2012    Procedure: URETHROGRAM;  Surgeon: Reece Packer, MD;  Location: [facility];  Service: Urology;  Laterality: N/A;  cystogram  . Cystoscopy  07/30/2012    Procedure: CYSTOSCOPY;  Surgeon: Reece Packer, MD;  Location: [facility];  Service: Urology;  Laterality: Bilateral;  retrograde  . Insertion of dialysis catheter  08/04/2012    Procedure:  INSERTION OF DIALYSIS CATHETER;  Surgeon: Angelia Mould, MD;  Location: [place];  Service: Vascular;  Laterality: Right;  right intrajugular hemodialysis catheter insertion  . Av fistula placement  08/14/2012    Procedure: INSERTION OF ARTERIOVENOUS (AV) GORE-TEX GRAFT ARM;  Surgeon: Mal Misty, MD;  Location: [place];  Service: Vascular;  Laterality: Left;  . Colostomy  02/2012    d/t an infection  . Av fistula placement Right 11/08/2012    Procedure: ARTERIOVENOUS (AV) FISTULA CREATION;  Surgeon: Angelia Mould, MD;  Location: [place];  Service: Vascular;  Laterality: Right;  . Surgery for fournier's gangrene  02/2012  . Colostomy  13  . Colostomy closure N/A 02/21/2013    Procedure: COLOSTOMY CLOSURE;  Surgeon: [name], MD;  Location: [place];  Service: General;  Laterality: N/A;  takedown of sigmoid colostomy with anastomosis  . Bowel resection N/A 02/21/2013    Procedure: SMALL BOWEL RESECTION;  Surgeon: [name], MD;  Location: [place];  Service: General;  Laterality: N/A;  . Fistulogram Right 01/06/2013    Procedure: FISTULOGRAM;  Surgeon: Angelia Mould, MD;  Location: [facility] CATH LAB;  Service: Cardiovascular;  Laterality: Right;  . Ventral hernia repair  10/12/2014  . Hernia repair    . Ventral hernia repair N/A 10/12/2014    Procedure: REPAIR VENTRAL INCISIONAL HERNIA;  Surgeon: Excell Seltzer, MD;  Location: [place];  Service: General;  Laterality: N/A;  . Insertion of mesh N/A 10/12/2014    Procedure: INSERTION OF MESH;  Surgeon: Excell Seltzer, MD;  Location: [place];  Service: General;  Laterality: N/A;    Patient Care Team: Gildardo Cranker, DO as PCP - General (Internal Medicine) Thayer Headings, MD as PCP - Infectious Diseases (Infectious Diseases) Minda Ditto, Rad Tech as Technician Corliss Parish, MD as Consulting Physician (Nephrology)  Social History   Social History  . Marital Status: Married    Spouse Name: N/A  . Number of  Children: 0  . Years of Education: N/A   Occupational History  . [employer]   Social History Main Topics  . Smoking status: Never Smoker   . Smokeless tobacco: Never Used  . Alcohol Use: No  . Drug Use: No  . Sexual Activity: No     Comment: declined condoms   Other Topics Concern  . Not on file   Social History Narrative   Single.  Unemployed.  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       reports that he has never smoked. He has never used smokeless tobacco. He reports that he does not drink  alcohol or use illicit drugs.  No Known Allergies  Medications: Patient's Medications  New Prescriptions   NYSTATIN CREAM (MYCOSTATIN)    Apply 1 application topically 2 (two) times daily.  Previous Medications   ALLOPURINOL (ZYLOPRIM) 300 MG TABLET    TAKE ONE TABLET BY MOUTH ONCE DAILY FOR GOUT   ASPIRIN EC 325 MG TABLET    Take 1 tablet (325 mg total) by mouth daily.   CARVEDILOL (COREG) 6.25 MG TABLET    Take 1 tablet (6.25 mg total) by mouth 2 (two) times daily.   DARUNAVIR-COBICISTAT (PREZCOBIX) 800-150 MG PER TABLET    Take 1 tablet by mouth daily. Swallow whole. Take with food. Please D/C Prezista and Norvir.   GABAPENTIN (NEURONTIN) 100 MG CAPSULE    TAKE ONE CAPSULE BY MOUTH THREE TIMES DAILY   HYDRALAZINE (APRESOLINE) 25 MG TABLET    Take 3 tablets (75 mg total) by mouth every 8 (eight) hours.   ISENTRESS 400 MG TABLET    TAKE ONE TABLET BY MOUTH TWICE DAILY   LAMIVUDINE (EPIVIR) 100 MG TABLET    Take 0.5 tablets (50 mg total) by mouth daily.   LANTHANUM (FOSRENOL) 1000 MG CHEWABLE TABLET    Chew 1,000 mg by mouth 3 (three) times daily with meals.    LEVOTHYROXINE (SYNTHROID, LEVOTHROID) 50 MCG TABLET    Take 1 tablet (50 mcg total) by mouth daily.   LOSARTAN (COZAAR) 50 MG TABLET    Take 1 tablet by mouth daily.   OXYCODONE-ACETAMINOPHEN (PERCOCET/ROXICET) 5-325 MG PER TABLET    Take 1 tablet by mouth every 6 (six) hours as needed for moderate pain.     ZIDOVUDINE (RETROVIR) 300 MG TABLET    TAKE ONE TABLET BY MOUTH EVERY DAY  Modified Medications   No medications on file  Discontinued Medications   CICLOPIROX (PENLAC) 8 % SOLUTION    Apply topically at bedtime. Apply over nail and surrounding skin. Apply daily over previous coat. After seven (7) days, may remove with alcohol and continue cycle.    Review of Systems  Constitutional: Negative for chills, activity change and fatigue.  HENT: Negative for sore throat and trouble swallowing.   Eyes: Negative for visual disturbance.  Respiratory: Negative for cough, chest tightness and shortness of breath.   Cardiovascular: Positive for leg swelling. Negative for chest pain and palpitations.  Gastrointestinal: Negative for nausea, vomiting, abdominal pain and blood in stool.  Genitourinary: Negative for urgency, frequency and difficulty urinating.  Musculoskeletal: Positive for myalgias, arthralgias and gait problem.  Skin: Positive for wound. Negative for rash.  Neurological: Negative for weakness and headaches.  Psychiatric/Behavioral: Negative for confusion and sleep disturbance. The patient is not nervous/anxious.     Filed Vitals:   05/05/15 0831  BP: 120/78  Pulse: 78  Temp: 97.7 F (36.5 C)  TempSrc: Oral  Resp: 20  Height: _0  (1.803 m)  Weight: 221 lb 12.8 oz (100.608 kg)  SpO2: 98%   Body mass index is 30.95 kg/(m^2).  Physical Exam  Constitutional: He appears well-developed and well-nourished. No distress.  Musculoskeletal: He exhibits edema and tenderness.  Skin: Skin is warm and dry. Rash (angry appearing with maceration. dry. no secondary signs of infection) noted. There is erythema.     Psychiatric: He has a normal mood and affect. His behavior is normal.     Labs reviewed: Office Visit on 03/31/2015  Component Date Value Ref Range Status  . WBC 03/31/2015 4.5  4.0 - 10.5 K/uL Final  .  RBC 03/31/2015 3.40* 4.22 - 5.81 MIL/uL Final  . Hemoglobin  03/31/2015 12.1* 13.0 - 17.0 g/dL Final  . HCT 03/31/2015 36.1* 39.0 - 52.0 % Final  . MCV 03/31/2015 106.2* 78.0 - 100.0 fL Final  . MCH 03/31/2015 35.6* 26.0 - 34.0 pg Final  . MCHC 03/31/2015 33.5  30.0 - 36.0 g/dL Final  . RDW 03/31/2015 16.7* 11.5 - 15.5 % Final  . Platelets 03/31/2015 210  150 - 400 K/uL Final  . MPV 03/31/2015 9.0  8.6 - 12.4 fL Final  . Neutrophils Relative % 03/31/2015 53  43 - 77 % Final  . Neutro Abs 03/31/2015 2.4  1.7 - 7.7 K/uL Final  . Lymphocytes Relative 03/31/2015 33  12 - 46 % Final  . Lymphs Abs 03/31/2015 1.5  0.7 - 4.0 K/uL Final  . Monocytes Relative 03/31/2015 12  3 - 12 % Final  . Monocytes Absolute 03/31/2015 0.5  0.1 - 1.0 K/uL Final  . Eosinophils Relative 03/31/2015 1  0 - 5 % Final  . Eosinophils Absolute 03/31/2015 0.0  0.0 - 0.7 K/uL Final  . Basophils Relative 03/31/2015 1  0 - 1 % Final  . Basophils Absolute 03/31/2015 0.0  0.0 - 0.1 K/uL Final  . Smear Review 03/31/2015 Criteria for review not met   Final  . Sodium 03/31/2015 141  135 - 145 mEq/L Final  . Potassium 03/31/2015 3.8  3.5 - 5.3 mEq/L Final  . Chloride 03/31/2015 96  96 - 112 mEq/L Final  . CO2 03/31/2015 30  19 - 32 mEq/L Final  . Glucose, Bld 03/31/2015 82  70 - 99 mg/dL Final  . BUN 03/31/2015 37* 6 - 23 mg/dL Final  . Creat 03/31/2015 8.41* 0.50 - 1.35 mg/dL Final   Comment: Result repeated and verified. No visible hemolysis.   . Total Bilirubin 03/31/2015 0.7  0.2 - 1.2 mg/dL Final  . Alkaline Phosphatase 03/31/2015 63  39 - 117 U/L Final  . AST 03/31/2015 13  0 - 37 U/L Final  . ALT 03/31/2015 11  0 - 53 U/L Final  . Total Protein 03/31/2015 8.1  6.0 - 8.3 g/dL Final  . Albumin 03/31/2015 4.0  3.5 - 5.2 g/dL Final  . Calcium 03/31/2015 10.3  8.4 - 10.5 mg/dL Final  . GFR, Est African American 03/31/2015 7*  Final  . GFR, Est Non African American 03/31/2015 6*  Final   Comment:   The estimated GFR is a calculation valid for adults (>=50 years old) that uses  the CKD-EPI algorithm to adjust for age and sex. It is   not to be used for children, pregnant women, hospitalized patients,    patients on dialysis, or with rapidly changing kidney function. According to the NKDEP, eGFR >89 is normal, 60-89 shows mild impairment, 30-59 shows moderate impairment, 15-29 shows severe impairment and <15 is ESRD.     Marland Kitchen HIV 1 RNA Quant 03/31/2015 124* <20 copies/mL Final  . HIV1 RNA Quant, Log 03/31/2015 2.09* <1.30 log 10 Final   Comment:   This test utilizes the [place] FDA approved Roche HIV-1 Test Kit by RT-PCR.     . CD4 T Cell Abs 03/31/2015 500  400 - 2700 /uL Final  . CD4 % Helper T Cell 03/31/2015 30* 33 - 55 % Final   Performed at [facility]    No results found.   Assessment/Plan   ICD-9-CM ICD-10-CM   1. Tinea cruris 110.3 B35.6 nystatin cream (MYCOSTATIN)  2. Hip bursitis, right 726.5 M70.71   3. Dysuria 788.1 R30.0 Urinalysis with Reflex Microscopic  4. End stage renal disease 585.6 N18.6   5. Lump of skin of lower extremity, left - stable; possible gout tophi 782.2 R22.42     Keep groin dry  Take all medications as ordered  Follow up as scheduled  Warm compresses as needed to left hip for bursitis  Keep appt with specialists  Jamelah Sitzer S. Perlie Gold  [facility] [address] [place], [place] [address] [phone] Cell (Monday-Friday 8 AM - 5 PM) [phone] After 5 PM and follow prompts

## 2015-05-05 NOTE — Patient Instructions (Signed)
Keep groin dry  Take all medications as ordered  Follow up as scheduled  Warm compresses as needed to left hip for bursitis  Keep appt with specialists

## 2015-05-06 LAB — URINALYSIS, ROUTINE W REFLEX MICROSCOPIC
Bilirubin, UA: NEGATIVE
Glucose, UA: NEGATIVE
Ketones, UA: NEGATIVE
NITRITE UA: NEGATIVE
PH UA: 7 (ref 5.0–7.5)
Specific Gravity, UA: 1.015 (ref 1.005–1.030)
Urobilinogen, Ur: 0.2 mg/dL (ref 0.2–1.0)

## 2015-05-06 LAB — MICROSCOPIC EXAMINATION
CASTS: NONE SEEN /LPF
RBC, UA: >30 /hpf — AB (ref 0–?)
WBC, UA: >30 /hpf — AB (ref 0–?)

## 2015-05-07 ENCOUNTER — Telehealth: Payer: Self-pay

## 2015-05-07 NOTE — Telephone Encounter (Signed)
-----   Message from [place], [place] sent at 05/06/2015  6:20 PM EDT ----- No treatment necessary. It may or may not grow back. If he notices anything draining, may need abx +/- podiatry referral ----- Message -----    From: Oralia Manis, CMA    Sent: 05/06/2015   4:57 PM      To: Gildardo Cranker, DO  Called Mr Steffenhagen to tell him of your result notes he wanted me to tell you that his toe nail fell off completely and there was no blood he says it just rotted off. He wants to know what should he do for it. Please advise

## 2015-05-07 NOTE — Telephone Encounter (Signed)
Called spoke with patient about his toe nail and told him what Dr. Eulas Post said to watch for he had no questions.

## 2015-05-19 NOTE — Progress Notes (Signed)
Notified [facility] by fax. Janeil Schexnayder M, RN 

## 2015-05-21 ENCOUNTER — Encounter: Payer: Self-pay | Admitting: Internal Medicine

## 2015-05-21 ENCOUNTER — Ambulatory Visit (INDEPENDENT_AMBULATORY_CARE_PROVIDER_SITE_OTHER): Payer: Medicare Other | Admitting: Internal Medicine

## 2015-05-21 VITALS — BP 112/88 | HR 73 | Temp 97.7°F | Resp 20 | Ht 71.0 in | Wt 223.2 lb

## 2015-05-21 DIAGNOSIS — B20 Human immunodeficiency virus [HIV] disease: Secondary | ICD-10-CM | POA: Diagnosis not present

## 2015-05-21 DIAGNOSIS — R319 Hematuria, unspecified: Secondary | ICD-10-CM

## 2015-05-21 DIAGNOSIS — N390 Urinary tract infection, site not specified: Secondary | ICD-10-CM | POA: Diagnosis not present

## 2015-05-21 DIAGNOSIS — N186 End stage renal disease: Secondary | ICD-10-CM

## 2015-05-21 LAB — POCT URINALYSIS DIPSTICK
GLUCOSE UA: NEGATIVE
Nitrite, UA: NEGATIVE
PH UA: 6.5
Protein, UA: 2000
SPEC GRAV UA: 1.025
Urobilinogen, UA: NEGATIVE

## 2015-05-21 MED ORDER — CIPROFLOXACIN HCL 500 MG PO TABS: 500.0000 mg | ORAL_TABLET | Freq: Every day | ORAL | Status: DC

## 2015-05-21 NOTE — Progress Notes (Signed)
Patient ID: Bryan Eckstein., male   DOB: 05/13/1951, 64 y.o.   MRN: [id number]    Location:    [facility]   Place of Service:   OFFICE  Chief Complaint  Patient presents with  . Acute Visit    Possible UTI, started about 1 week ago has pain with urination and foul oder and urine is cloudy    HPI:  64 yo male seen today for dysuria. He had penile d/c last week and took 5 augmentin within a few hrs of each other which helped reduce d/c. He now has dysuria and min penile d/c. Urine cloudy but no gross hematuria. No abdominal pain. Min back pain. He denies being sexually active x 3 yrs. He has been out of town and plans to go out the area again in next few weeks. He is on HD TThSa.  Past Medical History  Diagnosis Date  . HIV (human immunodeficiency virus infection)   . Sepsis(995.91)   . Gout   . Fournier's gangrene   . Severe protein-calorie malnutrition   . Alcohol abuse   . Dysrhythmia     AFib, not a candidate for coumadin , dr [name] signed off on pt  . Hypertension   . Colostomy status   . Sebaceous cyst   . Cough   . Unspecified constipation   . GERD (gastroesophageal reflux disease)   . Atrial fibrillation   . Ulcer of lower limb, unspecified   . Ulcer of lower limb, unspecified   . Other severe protein-calorie malnutrition   . Disorders of phosphorus metabolism   . Hypopotassemia   . Altered mental status   . Hemoptysis, unspecified   . Acute respiratory failure   . Gangrene   . Sepsis(995.91)   . Peripheral vascular disease, unspecified     denies  . Hypothyroidism   . Myocardial infarction     "told by a dr I had evidence of one that I didn't know about" (10/12/2014)  . Anemia   . Hepatitis C     "I've been treated for all of them" (10/12/2014)  . Hepatitis A   . Hepatitis B   . Gouty arthritis   . Arthritis     "joints" (10/12/2014)  . ESRD (end stage renal disease) on dialysis     "TTS; [facility] " (10/12/2014)  . End stage renal disease   . Anemia in  chronic kidney disease(285.21)     Past Surgical History  Procedure Laterality Date  . Cystoscopy with urethral dilatation  02/17/2012    Procedure: CYSTOSCOPY WITH URETHRAL DILATATION;  Surgeon: Reece Packer, MD;  Location: [facility];  Service: Urology;  Laterality: N/A;  retrograde urethragram, placement of foley catheter exam under anesthesia   . Insertion of dialysis catheter  02/20/2012    Procedure: INSERTION OF DIALYSIS CATHETER;  Surgeon: Angelia Mould, MD;  [facility];  Service: Vascular;  Laterality: N/A;  . Irrigation and debridement abscess  02/29/2012    Procedure: IRRIGATION AND DEBRIDEMENT ABSCESS;  Surgeon: Reece Packer, MD;  Location: [place];  Service: Urology;  Laterality: N/A;  I&D of scrotum abcess  . Insertion of dialysis catheter  03/29/2012    Procedure: INSERTION OF DIALYSIS CATHETER;  Surgeon: Rosetta Posner, MD;  Location: [facility] OR;  Service: Vascular;  Laterality: N/A;  Insertion tunneled dialysis catheter right IJ  . Av fistula placement  04/12/2012    Procedure: INSERTION OF ARTERIOVENOUS (AV) GORE-TEX GRAFT ARM;  Surgeon: Sherren Mocha  Katina Dung, MD;  Location: [facility] OR;  Service: Vascular;  Laterality: Left;  insertion of 6x50 stretch goretex graft   . Thrombectomy and revision of arterioventous (av) goretex  graft  07/15/2012    Procedure: THROMBECTOMY AND REVISION OF ARTERIOVENTOUS (AV) GORETEX  GRAFT;  Surgeon: Elam Dutch, MD;  Location: [place];  Service: Vascular;  Laterality: Left;  . Urethrogram  07/30/2012    Procedure: URETHROGRAM;  Surgeon: Reece Packer, MD;  Location: [facility];  Service: Urology;  Laterality: N/A;  cystogram  . Cystoscopy  07/30/2012    Procedure: CYSTOSCOPY;  Surgeon: Reece Packer, MD;  Location: [facility];  Service: Urology;  Laterality: Bilateral;  retrograde  . Insertion of dialysis catheter  08/04/2012    Procedure: INSERTION OF DIALYSIS CATHETER;  Surgeon: Angelia Mould, MD;  Location: [place];  Service: Vascular;   Laterality: Right;  right intrajugular hemodialysis catheter insertion  . Av fistula placement  08/14/2012    Procedure: INSERTION OF ARTERIOVENOUS (AV) GORE-TEX GRAFT ARM;  Surgeon: Mal Misty, MD;  Location: [place];  Service: Vascular;  Laterality: Left;  . Colostomy  02/2012    d/t an infection  . Av fistula placement Right 11/08/2012    Procedure: ARTERIOVENOUS (AV) FISTULA CREATION;  Surgeon: Angelia Mould, MD;  Location: [place];  Service: Vascular;  Laterality: Right;  . Surgery for fournier's gangrene  02/2012  . Colostomy  13  . Colostomy closure N/A 02/21/2013    Procedure: COLOSTOMY CLOSURE;  Surgeon: [name], MD;  Location: [place];  Service: General;  Laterality: N/A;  takedown of sigmoid colostomy with anastomosis  . Bowel resection N/A 02/21/2013    Procedure: SMALL BOWEL RESECTION;  Surgeon: [name], MD;  Location: [place];  Service: General;  Laterality: N/A;  . Fistulogram Right 01/06/2013    Procedure: FISTULOGRAM;  Surgeon: Angelia Mould, MD;  Location: [facility] CATH LAB;  Service: Cardiovascular;  Laterality: Right;  . Ventral hernia repair  10/12/2014  . Hernia repair    . Ventral hernia repair N/A 10/12/2014    Procedure: REPAIR VENTRAL INCISIONAL HERNIA;  Surgeon: Excell Seltzer, MD;  Location: [place];  Service: General;  Laterality: N/A;  . Insertion of mesh N/A 10/12/2014    Procedure: INSERTION OF MESH;  Surgeon: Excell Seltzer, MD;  Location: [place];  Service: General;  Laterality: N/A;    Patient Care Team: Gildardo Cranker, DO as PCP - General (Internal Medicine) Thayer Headings, MD as PCP - Infectious Diseases (Infectious Diseases) Minda Ditto, Rad Tech as Technician Corliss Parish, MD as Consulting Physician (Nephrology)  Social History   Social History  . Marital Status: Married    Spouse Name: N/A  . Number of Children: 0  . Years of Education: N/A   Occupational History  . [employer]   Social  History Main Topics  . Smoking status: Never Smoker   . Smokeless tobacco: Never Used  . Alcohol Use: No  . Drug Use: No  . Sexual Activity: No     Comment: declined condoms   Other Topics Concern  . Not on file   Social History Narrative   Single.  Unemployed.  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       reports that he has never smoked. He has never used smokeless tobacco. He reports that he does not drink alcohol or use illicit drugs.  No Known Allergies  Medications:  Patient's Medications  New Prescriptions   CIPROFLOXACIN (CIPRO) 500 MG TABLET    Take 1 tablet (500 mg total) by mouth daily. Take in the evening  Previous Medications   ALLOPURINOL (ZYLOPRIM) 300 MG TABLET    TAKE ONE TABLET BY MOUTH ONCE DAILY FOR GOUT   ASPIRIN EC 325 MG TABLET    Take 1 tablet (325 mg total) by mouth daily.   CARVEDILOL (COREG) 6.25 MG TABLET    Take 1 tablet (6.25 mg total) by mouth 2 (two) times daily.   DARUNAVIR-COBICISTAT (PREZCOBIX) 800-150 MG PER TABLET    Take 1 tablet by mouth daily. Swallow whole. Take with food. Please D/C Prezista and Norvir.   GABAPENTIN (NEURONTIN) 100 MG CAPSULE    TAKE ONE CAPSULE BY MOUTH THREE TIMES DAILY   HYDRALAZINE (APRESOLINE) 25 MG TABLET    Take 3 tablets (75 mg total) by mouth every 8 (eight) hours.   ISENTRESS 400 MG TABLET    TAKE ONE TABLET BY MOUTH TWICE DAILY   LAMIVUDINE (EPIVIR) 100 MG TABLET    Take 0.5 tablets (50 mg total) by mouth daily.   LANTHANUM (FOSRENOL) 1000 MG CHEWABLE TABLET    Chew 1,000 mg by mouth 3 (three) times daily with meals.    LEVOTHYROXINE (SYNTHROID, LEVOTHROID) 50 MCG TABLET    Take 1 tablet (50 mcg total) by mouth daily.   LOSARTAN (COZAAR) 50 MG TABLET    Take 1 tablet by mouth daily.   NYSTATIN CREAM (MYCOSTATIN)    Apply 1 application topically 2 (two) times daily.   OXYCODONE-ACETAMINOPHEN (PERCOCET/ROXICET) 5-325 MG PER TABLET    Take 1 tablet by mouth every 6 (six) hours as needed for moderate pain.    ZIDOVUDINE (RETROVIR) 300 MG TABLET    TAKE ONE TABLET BY MOUTH EVERY DAY  Modified Medications   No medications on file  Discontinued Medications   No medications on file    Review of Systems  Constitutional: Negative for fever and chills.  Respiratory: Negative for shortness of breath.   Cardiovascular: Negative for chest pain.  Genitourinary: Positive for dysuria, flank pain, decreased urine volume and discharge. Negative for urgency and frequency.    Filed Vitals:   05/21/15 1108  BP: 112/88  Pulse: 73  Temp: 97.7 F (36.5 C)  TempSrc: Oral  Resp: 20  Height: _0  (1.803 m)  Weight: 223 lb 3.2 oz (101.243 kg)  SpO2: 97%   Body mass index is 31.14 kg/(m^2).  Physical Exam  Constitutional: He is oriented to person, place, and time.  Cardiovascular: Normal rate, regular rhythm and intact distal pulses.  Exam reveals friction rub. Exam reveals no gallop.   Murmur (1/6 SEM) heard. No LE edema b/l. No calf TTP. Right forearm AVG with palpable thrill/audible bruit  Pulmonary/Chest: No respiratory distress. He has no wheezes. He has no rales.  Abdominal: Soft. Bowel sounds are normal. He exhibits no distension and no mass. There is tenderness (umbilicus and suprapubic TTP. no CVAT). There is no rebound and no guarding.  Neurological: He is alert and oriented to person, place, and time.  Skin: Skin is warm and dry. No rash noted.  Psychiatric: He has a normal mood and affect. His behavior is normal.     Labs reviewed: Office Visit on 05/05/2015  Component Date Value Ref Range Status  . Specific Gravity, UA 05/05/2015 1.015  1.005 - 1.030 Final  . pH, UA 05/05/2015 7.0  5.0 - 7.5 Final  . Color, UA 05/05/2015  Yellow  Yellow Final  . Appearance Ur 05/05/2015 Turbid* Clear Final  . Leukocytes, UA 05/05/2015 3+* Negative Final  . Protein, UA 05/05/2015 3+* Negative/Trace Final  . Glucose, UA 05/05/2015 Negative  Negative Final  . Ketones, UA 05/05/2015 Negative  Negative  Final  . RBC, UA 05/05/2015 2+* Negative Final  . Bilirubin, UA 05/05/2015 Negative  Negative Final  . Urobilinogen, Ur 05/05/2015 0.2  0.2 - 1.0 mg/dL Final  . Nitrite, UA 05/05/2015 Negative  Negative Final  . Microscopic Examination 05/05/2015 See below:   Final   Microscopic was indicated and was performed.  . WBC, UA 05/05/2015 >30* 0 -  5 /hpf Final  . RBC, UA 05/05/2015 >30* 0 -  2 /hpf Final  . Epithelial Cells (non renal) 05/05/2015 0-10  0 - 10 /hpf Final  . Casts 05/05/2015 None seen  None seen /lpf Final  . Mucus, UA 05/05/2015 Present  Not Estab. Final  . Bacteria, UA 05/05/2015 Few  None seen/Few Final  Office Visit on 03/31/2015  Component Date Value Ref Range Status  . WBC 03/31/2015 4.5  4.0 - 10.5 K/uL Final  . RBC 03/31/2015 3.40* 4.22 - 5.81 MIL/uL Final  . Hemoglobin 03/31/2015 12.1* 13.0 - 17.0 g/dL Final  . HCT 03/31/2015 36.1* 39.0 - 52.0 % Final  . MCV 03/31/2015 106.2* 78.0 - 100.0 fL Final  . MCH 03/31/2015 35.6* 26.0 - 34.0 pg Final  . MCHC 03/31/2015 33.5  30.0 - 36.0 g/dL Final  . RDW 03/31/2015 16.7* 11.5 - 15.5 % Final  . Platelets 03/31/2015 210  150 - 400 K/uL Final  . MPV 03/31/2015 9.0  8.6 - 12.4 fL Final  . Neutrophils Relative % 03/31/2015 53  43 - 77 % Final  . Neutro Abs 03/31/2015 2.4  1.7 - 7.7 K/uL Final  . Lymphocytes Relative 03/31/2015 33  12 - 46 % Final  . Lymphs Abs 03/31/2015 1.5  0.7 - 4.0 K/uL Final  . Monocytes Relative 03/31/2015 12  3 - 12 % Final  . Monocytes Absolute 03/31/2015 0.5  0.1 - 1.0 K/uL Final  . Eosinophils Relative 03/31/2015 1  0 - 5 % Final  . Eosinophils Absolute 03/31/2015 0.0  0.0 - 0.7 K/uL Final  . Basophils Relative 03/31/2015 1  0 - 1 % Final  . Basophils Absolute 03/31/2015 0.0  0.0 - 0.1 K/uL Final  . Smear Review 03/31/2015 Criteria for review not met   Final  . Sodium 03/31/2015 141  135 - 145 mEq/L Final  . Potassium 03/31/2015 3.8  3.5 - 5.3 mEq/L Final  . Chloride 03/31/2015 96  96 - 112 mEq/L  Final  . CO2 03/31/2015 30  19 - 32 mEq/L Final  . Glucose, Bld 03/31/2015 82  70 - 99 mg/dL Final  . BUN 03/31/2015 37* 6 - 23 mg/dL Final  . Creat 03/31/2015 8.41* 0.50 - 1.35 mg/dL Final   Comment: Result repeated and verified. No visible hemolysis.   . Total Bilirubin 03/31/2015 0.7  0.2 - 1.2 mg/dL Final  . Alkaline Phosphatase 03/31/2015 63  39 - 117 U/L Final  . AST 03/31/2015 13  0 - 37 U/L Final  . ALT 03/31/2015 11  0 - 53 U/L Final  . Total Protein 03/31/2015 8.1  6.0 - 8.3 g/dL Final  . Albumin 03/31/2015 4.0  3.5 - 5.2 g/dL Final  . Calcium 03/31/2015 10.3  8.4 - 10.5 mg/dL Final  . GFR, Est African American 03/31/2015 7*  Final  .  GFR, Est Non African American 03/31/2015 6*  Final   Comment:   The estimated GFR is a calculation valid for adults (>=87 years old) that uses the CKD-EPI algorithm to adjust for age and sex. It is   not to be used for children, pregnant women, hospitalized patients,    patients on dialysis, or with rapidly changing kidney function. According to the NKDEP, eGFR >89 is normal, 60-89 shows mild impairment, 30-59 shows moderate impairment, 15-29 shows severe impairment and <15 is ESRD.     Marland Kitchen HIV 1 RNA Quant 03/31/2015 124* <20 copies/mL Final  . HIV1 RNA Quant, Log 03/31/2015 2.09* <1.30 log 10 Final   Comment:   This test utilizes the [place] FDA approved Roche HIV-1 Test Kit by RT-PCR.     . CD4 T Cell Abs 03/31/2015 500  400 - 2700 /uL Final  . CD4 % Helper T Cell 03/31/2015 30* 33 - 55 % Final   Performed at [facility]    No results found.   Assessment/Plan   ICD-9-CM ICD-10-CM   1. Urinary tract infection with hematuria, site unspecified 599.0 N39.0 ciprofloxacin (CIPRO) 500 MG tablet    R31.9 Culture, Urine  2. End stage renal disease 585.6 N18.6    on HD TThSa mornings  3. Human immunodeficiency virus (HIV) disease 042 B20     --take cipro in PM daily x 10 days. It needs to be taken AFTER HD on dialysis  days  --send urine cx & s  --cont current meds as ordered  --f/u with specialists as scheduled  --needs flu shot at net OV if not had already  --keep Dec appt as scheduled and prn  --will follow  Percilla Tweten S. Perlie Gold  [facility] and [facility] [address] [place], [place] [address] [phone] Cell (Monday-Friday 8 AM - 5 PM) [phone] After 5 PM and follow prompts

## 2015-05-21 NOTE — Patient Instructions (Signed)
TAKE ALL of antibiotic (Cipro) as ordered  Will call with culture results  Get Flu shot  Continue other medications as ordered  Follow up in Dec as scheduled

## 2015-05-24 ENCOUNTER — Ambulatory Visit: Payer: Medicare Other | Admitting: Podiatry

## 2015-05-24 LAB — URINE CULTURE

## 2015-06-11 ENCOUNTER — Other Ambulatory Visit: Payer: Self-pay | Admitting: Gastroenterology

## 2015-06-11 ENCOUNTER — Encounter: Payer: Self-pay | Admitting: Podiatry

## 2015-06-11 ENCOUNTER — Ambulatory Visit (INDEPENDENT_AMBULATORY_CARE_PROVIDER_SITE_OTHER): Payer: Medicare Other | Admitting: Podiatry

## 2015-06-11 VITALS — BP 133/86 | HR 86 | Resp 12

## 2015-06-11 DIAGNOSIS — M79673 Pain in unspecified foot: Secondary | ICD-10-CM

## 2015-06-11 DIAGNOSIS — G629 Polyneuropathy, unspecified: Secondary | ICD-10-CM | POA: Diagnosis not present

## 2015-06-11 DIAGNOSIS — K7460 Unspecified cirrhosis of liver: Secondary | ICD-10-CM

## 2015-06-11 DIAGNOSIS — B351 Tinea unguium: Secondary | ICD-10-CM | POA: Diagnosis not present

## 2015-06-11 NOTE — Progress Notes (Signed)
Patient ID: Eryn Krejci., male   DOB: [birth year], 64 y.o.   MRN: [id number]  Subjective: 64 y.o. returns the office today for painful, elongated, thickened toenails which she is unable to trim himself. Denies any redness or drainage around the nails. He also states that he continues have some burning and tingling to his feet for which she is taking gabapentin. He is currently taking 200 mg in the morning and at lunch 300 mg at nighttime. He is denying any side effects the medication at this time. The medication does help somewhat but he discontinued have symptoms. Denies any acute changes since last appointment and no new complaints today. Denies any systemic complaints such as fevers, chills, nausea, vomiting.   Objective: AAO 3, NAD DP/PT pulses 1/4, CRT less than 3 seconds Protective sensation decreased with Simms Weinstein monofilament Nails hypertrophic, dystrophic, elongated, brittle, discolored 10. There is tenderness overlying the nails 1-5 bilaterally. There is no surrounding erythema or drainage along the nail sites. No open lesions or pre-ulcerative lesions are identified. No other areas of tenderness bilateral lower extremities. No overlying edema, erythema, increased warmth. No pain with calf compression, swelling, warmth, erythema.  Assessment: Patient presents with symptomatic onychomycosis; neuropathy  Plan: -Treatment options including alternatives, risks, complications were discussed -Nails sharply debrided 10 without complication/bleeding. -Discussed with him he can titrate up to 300 mg 3 times a day gabapentin. Recommend also discuss with his primary care physician. -Discussed daily foot inspection. If there are any changes, to call the office immediately.  -Follow-up in 3 months or sooner if any problems are to arise. In the meantime, encouraged to call the office with any questions, concerns, changes symptoms.  Celesta Gentile, DPM

## 2015-06-16 ENCOUNTER — Ambulatory Visit
Admission: RE | Admit: 2015-06-16 | Discharge: 2015-06-16 | Disposition: A | Discharge status: Home / Self Care | Payer: Medicare Other | Source: Ambulatory Visit | Attending: Gastroenterology | Admitting: Gastroenterology

## 2015-06-16 DIAGNOSIS — K7460 Unspecified cirrhosis of liver: Secondary | ICD-10-CM

## 2015-06-30 ENCOUNTER — Encounter: Payer: Self-pay | Admitting: Internal Medicine

## 2015-06-30 ENCOUNTER — Ambulatory Visit (INDEPENDENT_AMBULATORY_CARE_PROVIDER_SITE_OTHER): Payer: Medicare Other | Admitting: Internal Medicine

## 2015-06-30 VITALS — BP 145/91 | HR 85 | Temp 98.0°F | Wt 229.0 lb

## 2015-06-30 DIAGNOSIS — B20 Human immunodeficiency virus [HIV] disease: Secondary | ICD-10-CM | POA: Diagnosis not present

## 2015-06-30 DIAGNOSIS — B372 Candidiasis of skin and nail: Secondary | ICD-10-CM | POA: Diagnosis not present

## 2015-06-30 DIAGNOSIS — R21 Rash and other nonspecific skin eruption: Secondary | ICD-10-CM

## 2015-06-30 DIAGNOSIS — Z23 Encounter for immunization: Secondary | ICD-10-CM

## 2015-06-30 DIAGNOSIS — I10 Essential (primary) hypertension: Secondary | ICD-10-CM

## 2015-06-30 LAB — COMPLETE METABOLIC PANEL WITH GFR
ALK PHOS: 68 U/L (ref 40–115)
ALT: 23 U/L (ref 9–46)
AST: 19 U/L (ref 10–35)
Albumin: 3.8 g/dL (ref 3.6–5.1)
BUN: 49 mg/dL — AB (ref 7–25)
CALCIUM: 8 mg/dL — AB (ref 8.6–10.3)
CHLORIDE: 95 mmol/L — AB (ref 98–110)
CO2: 26 mmol/L (ref 20–31)
Creat: 8.42 mg/dL — ABNORMAL HIGH (ref 0.70–1.25)
GFR, Est African American: 7 mL/min — ABNORMAL LOW (ref >=60)
GFR, Est Non African American: 6 mL/min — ABNORMAL LOW (ref >=60)
Glucose, Bld: 107 mg/dL — ABNORMAL HIGH (ref 65–99)
POTASSIUM: 3.7 mmol/L (ref 3.5–5.3)
Sodium: 136 mmol/L (ref 135–146)
Total Bilirubin: 0.7 mg/dL (ref 0.2–1.2)
Total Protein: 7.2 g/dL (ref 6.1–8.1)

## 2015-06-30 LAB — CBC WITH DIFFERENTIAL/PLATELET
BASOS ABS: 0 10*3/uL (ref 0.0–0.1)
Basophils Relative: 0 % (ref 0–1)
EOS PCT: 1 % (ref 0–5)
Eosinophils Absolute: 0.1 10*3/uL (ref 0.0–0.7)
HEMATOCRIT: 32 % — AB (ref 39.0–52.0)
Hemoglobin: 10.6 g/dL — ABNORMAL LOW (ref 13.0–17.0)
LYMPHS PCT: 29 % (ref 12–46)
Lymphs Abs: 1.8 10*3/uL (ref 0.7–4.0)
MCH: 35.3 pg — ABNORMAL HIGH (ref 26.0–34.0)
MCHC: 33.1 g/dL (ref 30.0–36.0)
MCV: 106.7 fL — ABNORMAL HIGH (ref 78.0–100.0)
MPV: 8.9 fL (ref 8.6–12.4)
Monocytes Absolute: 0.4 10*3/uL (ref 0.1–1.0)
Monocytes Relative: 7 % (ref 3–12)
NEUTROS ABS: 3.8 10*3/uL (ref 1.7–7.7)
Neutrophils Relative %: 63 % (ref 43–77)
Platelets: 196 10*3/uL (ref 150–400)
RBC: 3 MIL/uL — AB (ref 4.22–5.81)
RDW: 16.4 % — AB (ref 11.5–15.5)
WBC: 6.1 10*3/uL (ref 4.0–10.5)

## 2015-06-30 MED ORDER — NYSTATIN 100000 UNIT/GM EX POWD: Freq: Two times a day (BID) | CUTANEOUS | Status: DC

## 2015-06-30 NOTE — Progress Notes (Signed)
Patient ID: Bryan Gilmore., male   DOB: 01/29/1951, 64 y.o.   MRN: [id number]       Patient ID: Bryan Gilmore., male   DOB: [birth year], 64 y.o.   MRN: [id number]  HPI 64yo M with HIV-chronic hep C without hepatic coma, on RLG/DRVc/ZDV/Lam. CD 4 count 500/VL 124 Doing well hiv regimen. He has finished his treatment for chronic hepatitis C. He states that his subcutaneous bumps around his surgical sites that he is worried. No drainage, well healed  Outpatient Encounter Prescriptions as of 06/30/2015  Medication Sig  . allopurinol (ZYLOPRIM) 300 MG tablet TAKE ONE TABLET BY MOUTH ONCE DAILY FOR GOUT  . darunavir-cobicistat (PREZCOBIX) 800-150 MG per tablet Take 1 tablet by mouth daily. Swallow whole. Take with food. Please D/C Prezista and Norvir.  Marland Kitchen gabapentin (NEURONTIN) 100 MG capsule TAKE ONE CAPSULE BY MOUTH THREE TIMES DAILY  . ISENTRESS 400 MG tablet TAKE ONE TABLET BY MOUTH TWICE DAILY  . lamivudine (EPIVIR) 100 MG tablet Take 0.5 tablets (50 mg total) by mouth daily.  Marland Kitchen lanthanum (FOSRENOL) 1000 MG chewable tablet Chew 1,000 mg by mouth 3 (three) times daily with meals.   Marland Kitchen losartan (COZAAR) 50 MG tablet Take 1 tablet by mouth daily.  . zidovudine (RETROVIR) 300 MG tablet TAKE ONE TABLET BY MOUTH EVERY DAY  . [DISCONTINUED] nystatin cream (MYCOSTATIN) Apply 1 application topically 2 (two) times daily.  Marland Kitchen aspirin EC 325 MG tablet Take 1 tablet (325 mg total) by mouth daily. (Patient not taking: Reported on 06/30/2015)  . carvedilol (COREG) 6.25 MG tablet Take 1 tablet (6.25 mg total) by mouth 2 (two) times daily. (Patient not taking: Reported on 06/30/2015)  . hydrALAZINE (APRESOLINE) 25 MG tablet Take 3 tablets (75 mg total) by mouth every 8 (eight) hours. (Patient not taking: Reported on 06/30/2015)  . levothyroxine (SYNTHROID, LEVOTHROID) 50 MCG tablet Take 1 tablet (50 mcg total) by mouth daily. (Patient not taking: Reported on 06/30/2015)  . [DISCONTINUED] ciprofloxacin (CIPRO) 500 MG  tablet Take 1 tablet (500 mg total) by mouth daily. Take in the evening  . [DISCONTINUED] oxyCODONE-acetaminophen (PERCOCET/ROXICET) 5-325 MG per tablet Take 1 tablet by mouth every 6 (six) hours as needed for moderate pain.   No facility-administered encounter medications on file as of 06/30/2015.     Patient Active Problem List   Diagnosis Date Noted  . Hypertensive urgency 12/21/2014  . Hypothyroid 12/21/2014  . Chronic hepatitis C without hepatic coma ([place]) 11/25/2014  . Ventral incisional hernia 10/12/2014  . Thyroid activity decreased 07/29/2014  . Essential hypertension 06/24/2014  . Floaters in visual field 06/24/2014  . Atrial fibrillation, unspecified 06/24/2014  . Other specified abdominal hernia without obstruction or gangrene 06/24/2014  . Esophageal reflux 06/24/2014  . Annual physical exam 06/24/2014  . Gynecomastia 11/12/2013  . Other complications due to renal dialysis device, implant, and graft 01/29/2013  . Colostomy status ([place]) 12/26/2012  . Aftercare following surgery of the circulatory system, [place] 12/18/2012  . End stage renal disease ([place]) 10/30/2012  . Alcohol abuse   . Atrial fibrillation, permanent ([place]) 04/04/2012  . Fever 04/01/2012  . Hyperkalemia 03/29/2012  . Secondary hyperparathyroidism (of renal origin) 03/29/2012  . ESRD (end stage renal disease) ([place]) 02/29/2012  . Anemia of chronic renal failure 02/29/2012  . Normocytic anemia 02/17/2012  . Acute hepatitis C virus infection 10/07/2010  . Chronic gouty arthropathy 10/07/2010  . 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 Human immunodeficiency virus (HIV) disease ([place]) 09/15/2010  Health Maintenance Due  Topic Date Due  . TETANUS/TDAP  11/18/1969  . COLONOSCOPY  11/18/2000  . INFLUENZA VACCINE  04/12/2015     Review of Systems Occasional rash, that is not currently active. 10 point ros negative Physical Exam   BP 145/91 mmHg  Pulse 85  Temp(Src) 98 F (36.7 C) (Oral)  Wt  229 lb (103.874 kg) Physical Exam  Constitutional: He is oriented to person, place, and time. He appears well-developed and well-nourished. No distress.  HENT:  Mouth/Throat: Oropharynx is clear and moist. No oropharyngeal exudate.  Cardiovascular: Normal rate, regular rhythm and normal heart sounds. Exam reveals no gallop and no friction rub.  No murmur heard.  Pulmonary/Chest: Effort normal and breath sounds normal. No respiratory distress. He has no wheezes.  Abdominal: Soft. Bowel sounds are normal. He exhibits no distension. There is no tenderness. Scarring from prior surgeries. Subcutaneous nodules, likely scar tissue/suture  Lymphadenopathy:  He has no cervical adenopathy.  Neurological: He is alert and oriented to person, place, and time.  Skin: Skin is warm and dry. No rash noted. No erythema.  Psychiatric: He has a normal mood and affect. His behavior is normal.    Lab Results  Component Value Date   CD4TCELL 30* 03/31/2015   Lab Results  Component Value Date   CD4TABS 500 03/31/2015   CD4TABS 460 11/11/2014   CD4TABS 650 07/20/2014   Lab Results  Component Value Date   HIV1RNAQUANT 124* 03/31/2015   Lab Results  Component Value Date   HEPBSAB NEGATIVE 02/18/2012   No results found for: RPR  CBC Lab Results  Component Value Date   WBC 4.5 03/31/2015   RBC 3.40* 03/31/2015   HGB 12.1* 03/31/2015   HCT 36.1* 03/31/2015   PLT 210 03/31/2015   MCV 106.2* 03/31/2015   MCH 35.6* 03/31/2015   MCHC 33.5 03/31/2015   RDW 16.7* 03/31/2015   LYMPHSABS 1.5 03/31/2015   MONOABS 0.5 03/31/2015   EOSABS 0.0 03/31/2015   BASOSABS 0.0 03/31/2015   BMET Lab Results  Component Value Date   NA 141 03/31/2015   K 3.8 03/31/2015   CL 96 03/31/2015   CO2 30 03/31/2015   GLUCOSE 82 03/31/2015   BUN 37* 03/31/2015   CREATININE 8.41* 03/31/2015   CALCIUM 10.3 03/31/2015   GFRNONAA 6* 03/31/2015   GFRAA 7* 03/31/2015     Assessment and Plan  hiv disease =viral  blip on last labs this summer. Will check viral load  Hypertension = under moderate control. He reports times in HD where he has significant drop in diastolic pressures. For now will continue with current regimen  Health maintenance = will give flu shot  Skin lesions = healing, recommend to come to [place] again if slow healing ulcers come about, concerning for pressure ulcer, possibly ? Early calciphalaxis  Candidal skin infection of groin = will do nystatin powder bid

## 2015-07-01 LAB — T-HELPER CELL (CD4) - (RCID CLINIC ONLY)
CD4 % Helper T Cell: 33 % (ref 33–55)
CD4 T CELL ABS: 560 /uL (ref 400–2700)

## 2015-07-01 LAB — RPR

## 2015-07-02 LAB — HIV-1 RNA QUANT-NO REFLEX-BLD
HIV 1 RNA Quant: <20 {copies}/mL
HIV-1 RNA Quant, Log: <1.3 {Log_copies}/mL

## 2015-07-23 ENCOUNTER — Encounter: Payer: Self-pay | Admitting: Internal Medicine

## 2015-07-23 ENCOUNTER — Ambulatory Visit (INDEPENDENT_AMBULATORY_CARE_PROVIDER_SITE_OTHER): Payer: Medicare Other | Admitting: Internal Medicine

## 2015-07-23 VITALS — BP 132/82 | HR 85 | Temp 98.0°F | Resp 20 | Ht 71.0 in | Wt 229.0 lb

## 2015-07-23 DIAGNOSIS — Z8719 Personal history of other diseases of the digestive system: Secondary | ICD-10-CM | POA: Diagnosis not present

## 2015-07-23 DIAGNOSIS — I482 Chronic atrial fibrillation, unspecified: Secondary | ICD-10-CM

## 2015-07-23 DIAGNOSIS — Z9889 Other specified postprocedural states: Secondary | ICD-10-CM

## 2015-07-23 DIAGNOSIS — Z992 Dependence on renal dialysis: Secondary | ICD-10-CM | POA: Diagnosis not present

## 2015-07-23 DIAGNOSIS — R079 Chest pain, unspecified: Secondary | ICD-10-CM | POA: Diagnosis not present

## 2015-07-23 DIAGNOSIS — N186 End stage renal disease: Secondary | ICD-10-CM

## 2015-07-23 DIAGNOSIS — R2242 Localized swelling, mass and lump, left lower limb: Secondary | ICD-10-CM | POA: Diagnosis not present

## 2015-07-23 MED ORDER — GABAPENTIN 300 MG PO CAPS: 300.0000 mg | ORAL_CAPSULE | Freq: Three times a day (TID) | ORAL | Status: DC

## 2015-07-23 NOTE — Patient Instructions (Addendum)
Increase gabapentin to 3 caps (300mg  total) 3 times daily  Will call with referrals to cardiology and podiatry  He will make appt to see vascular surgery to discuss possible removal of nonfunctioning grafts in left arm  Continue current medications as ordered  Watch abdominal pain - if increases or associated with nausea/vomiting, fever/chills, change in bowel/bladder habits, go to the ER  Already had flu shot for 2016  Use moisturizing lotion to skin liberally every day  Follow up as scheduled for routine visit

## 2015-07-23 NOTE — Progress Notes (Signed)
Patient ID: Bryan Beranek., male   DOB: 1951-01-14, 64 y.o.   MRN: [id number]    Location:    [facility]   Place of Service:  OFFICE   Chief Complaint  Patient presents with  . Acute Visit    Patient c/o Has pain in his left foot and is developing more sores on it, and has pain in his left arm  . OTHER    Patients c/o also has pain in his belly were belly button is pain started about last month same for arm    HPI:  64 yo male seen today for left foot pain x several mos. He has f/u podiatry appt in Jan 2017. He feels that the knot on foot is not being addressed. He is taking 611m gabapentin daily  He also reports left arm pain x 30 sec + CP while eating 2 days ago. Sx's resolved on its own. He has sporadic pain in LUE and has intermittent numbness in distal extremity. He had a failed left arm AVG and failed AVG in forearm. He has HD via right forearm AVF. He would like to see cardio. He has a hx afib but takes no med for HR control  He is c/a umbilicus occasional pain when lying on his side. He had a ventral hernia repair in Feb 2016. No N/V. No f/c. No change in bowel/bladder habits. He saw GI since last visit and had CT abd last month which showed no acute changes  He has ESRD. He was started on sensipar by nephrology   Past Medical History  Diagnosis Date  . HIV (human immunodeficiency virus infection) ([place]   . Sepsis(995.91)   . Gout   . Fournier's gangrene   . Severe protein-calorie malnutrition ([place]   . Alcohol abuse   . Dysrhythmia     AFib, not a candidate for coumadin , dr jMartiniquesigned off on pt  . Hypertension   . Colostomy status ([place]   . Sebaceous cyst   . Cough   . Unspecified constipation   . GERD (gastroesophageal reflux disease)   . Atrial fibrillation ([place]   . Ulcer of lower limb, unspecified   . Ulcer of lower limb, unspecified   . Other severe protein-calorie malnutrition   . Disorders of phosphorus metabolism   . Hypopotassemia   . Altered mental  status   . Hemoptysis, unspecified   . Acute respiratory failure (HL[place]   . Gangrene ([facility]   . Sepsis(995.91)   . Peripheral vascular disease, unspecified ([facility]     denies  . Hypothyroidism   . Myocardial infarction ([facility]     "told by a dr I had evidence of one that I didn't know about" (10/12/2014)  . Anemia   . Hepatitis C     "I've been treated for all of them" (10/12/2014)  . Hepatitis A   . Hepatitis B   . Gouty arthritis   . Arthritis     "joints" (10/12/2014)  . ESRD (end stage renal disease) on dialysis ([facility]     "TTS; [facility] " (10/12/2014)  . End stage renal disease ([place]   . Anemia in chronic kidney disease(285.21)     Past Surgical History  Procedure Laterality Date  . Cystoscopy with urethral dilatation  02/17/2012    Procedure: CYSTOSCOPY WITH URETHRAL DILATATION;  Surgeon: SReece Packer MD;  [facility];  Service: Urology;  Laterality: N/A;  retrograde urethragram, placement of foley catheter exam under anesthesia   .  Insertion of dialysis catheter  02/20/2012    Procedure: INSERTION OF DIALYSIS CATHETER;  Surgeon: Angelia Mould, MD;  Location: [facility] OR;  Service: Vascular;  Laterality: N/A;  . Irrigation and debridement abscess  02/29/2012    Procedure: IRRIGATION AND DEBRIDEMENT ABSCESS;  Surgeon: Reece Packer, MD;  Location: [place];  Service: Urology;  Laterality: N/A;  I&D of scrotum abcess  . Insertion of dialysis catheter  03/29/2012    Procedure: INSERTION OF DIALYSIS CATHETER;  Surgeon: Rosetta Posner, MD;  Location: [facility] OR;  Service: Vascular;  Laterality: N/A;  Insertion tunneled dialysis catheter right IJ  . Av fistula placement  04/12/2012    Procedure: INSERTION OF ARTERIOVENOUS (AV) GORE-TEX GRAFT ARM;  Surgeon: Rosetta Posner, MD;  Location: [facility] OR;  Service: Vascular;  Laterality: Left;  insertion of 6x50 stretch goretex graft   . Thrombectomy and revision of arterioventous (av) goretex  graft  07/15/2012    Procedure: THROMBECTOMY AND  REVISION OF ARTERIOVENTOUS (AV) GORETEX  GRAFT;  Surgeon: Elam Dutch, MD;  Location: [place];  Service: Vascular;  Laterality: Left;  . Urethrogram  07/30/2012    Procedure: URETHROGRAM;  Surgeon: Reece Packer, MD;  [facility];  Service: Urology;  Laterality: N/A;  cystogram  . Cystoscopy  07/30/2012    Procedure: CYSTOSCOPY;  Surgeon: Reece Packer, MD;  [facility];  Service: Urology;  Laterality: Bilateral;  retrograde  . Insertion of dialysis catheter  08/04/2012    Procedure: INSERTION OF DIALYSIS CATHETER;  Surgeon: Angelia Mould, MD;  Location: [place];  Service: Vascular;  Laterality: Right;  right intrajugular hemodialysis catheter insertion  . Av fistula placement  08/14/2012    Procedure: INSERTION OF ARTERIOVENOUS (AV) GORE-TEX GRAFT ARM;  Surgeon: Mal Misty, MD;  Location: [place];  Service: Vascular;  Laterality: Left;  . Colostomy  02/2012    d/t an infection  . Av fistula placement Right 11/08/2012    Procedure: ARTERIOVENOUS (AV) FISTULA CREATION;  Surgeon: Angelia Mould, MD;  Location: [place];  Service: Vascular;  Laterality: Right;  . Surgery for fournier's gangrene  02/2012  . Colostomy  13  . Colostomy closure N/A 02/21/2013    Procedure: COLOSTOMY CLOSURE;  Surgeon: [name], MD;  Location: [place];  Service: General;  Laterality: N/A;  takedown of sigmoid colostomy with anastomosis  . Bowel resection N/A 02/21/2013    Procedure: SMALL BOWEL RESECTION;  Surgeon: [name], MD;  Location: [place];  Service: General;  Laterality: N/A;  . Fistulogram Right 01/06/2013    Procedure: FISTULOGRAM;  Surgeon: Angelia Mould, MD;  Location: [facility] CATH LAB;  Service: Cardiovascular;  Laterality: Right;  . Ventral hernia repair  10/12/2014  . Hernia repair    . Ventral hernia repair N/A 10/12/2014    Procedure: REPAIR VENTRAL INCISIONAL HERNIA;  Surgeon: Excell Seltzer, MD;  Location: [place];  Service: General;  Laterality: N/A;   . Insertion of mesh N/A 10/12/2014    Procedure: INSERTION OF MESH;  Surgeon: Excell Seltzer, MD;  Location: [place];  Service: General;  Laterality: N/A;    Patient Care Team: Gildardo Cranker, DO as PCP - General (Internal Medicine) Thayer Headings, MD as PCP - Infectious Diseases (Infectious Diseases) Minda Ditto, Rad Tech as Technician Corliss Parish, MD as Consulting Physician (Nephrology)  Social History   Social History  . Marital Status: Married    Spouse Name: N/A  . Number of Children:  0  . Years of Education: N/A   Occupational History  . [employer]   Social History Main Topics  . Smoking status: Never Smoker   . Smokeless tobacco: Never Used  . Alcohol Use: No  . Drug Use: No  . Sexual Activity: No     Comment: declined condoms   Other Topics Concern  . Not on file   Social History Narrative   Single.  Unemployed.  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       reports that he has never smoked. He has never used smokeless tobacco. He reports that he does not drink alcohol or use illicit drugs.  No Known Allergies  Medications: Patient's Medications  New Prescriptions   No medications on file  Previous Medications   ALLOPURINOL (ZYLOPRIM) 300 MG TABLET    TAKE ONE TABLET BY MOUTH ONCE DAILY FOR GOUT   DARUNAVIR-COBICISTAT (PREZCOBIX) 800-150 MG PER TABLET    Take 1 tablet by mouth daily. Swallow whole. Take with food. Please D/C Prezista and Norvir.   GABAPENTIN (NEURONTIN) 100 MG CAPSULE    TAKE ONE CAPSULE BY MOUTH THREE TIMES DAILY   ISENTRESS 400 MG TABLET    TAKE ONE TABLET BY MOUTH TWICE DAILY   LAMIVUDINE (EPIVIR) 100 MG TABLET    Take 0.5 tablets (50 mg total) by mouth daily.   LANTHANUM (FOSRENOL) 1000 MG CHEWABLE TABLET    Chew 1,000 mg by mouth 3 (three) times daily with meals.    LOSARTAN (COZAAR) 50 MG TABLET    Take 1 tablet by mouth daily.   NYSTATIN (MYCOSTATIN) POWDER    Apply topically 2 (two) times daily.  To groin   ZIDOVUDINE (RETROVIR) 300 MG TABLET    TAKE ONE TABLET BY MOUTH EVERY DAY  Modified Medications   No medications on file  Discontinued Medications   No medications on file    Review of Systems  Constitutional: Negative for chills, activity change and fatigue.  HENT: Negative for sore throat and trouble swallowing.   Eyes: Negative for visual disturbance.  Respiratory: Negative for cough, chest tightness and shortness of breath.   Cardiovascular: Positive for chest pain. Negative for palpitations and leg swelling.  Gastrointestinal: Positive for abdominal pain. Negative for nausea, vomiting and blood in stool.  Genitourinary: Negative for urgency, frequency and difficulty urinating.  Musculoskeletal: Positive for joint swelling, arthralgias and gait problem.  Skin: Negative for rash.  Neurological: Positive for numbness. Negative for weakness and headaches.  Psychiatric/Behavioral: Negative for confusion and sleep disturbance. The patient is not nervous/anxious.     Filed Vitals:   07/23/15 1541  BP: 132/82  Pulse: 85  Temp: 98 F (36.7 C)  TempSrc: Oral  Resp: 20  Height: '5\' 11"'  (1.803 m)  Weight: 229 lb (103.874 kg)  SpO2: 98%   Body mass index is 31.95 kg/(m^2).  Physical Exam  Constitutional: He is oriented to person, place, and time. He appears well-developed and well-nourished.  HENT:  Mouth/Throat: Oropharynx is clear and moist.  Eyes: Pupils are equal, round, and reactive to light. No scleral icterus.  Neck: Neck supple. Carotid bruit is not present. No thyromegaly present.  Cardiovascular: Normal rate and intact distal pulses.  An irregularly irregular rhythm present. Exam reveals no gallop and no friction rub.   Murmur heard.  Systolic murmur is present with a grade of 1/6  Trace LE edema b/l. No calf TTP. Palpable TTP LUE AVG x 2  but no secondary signs of infection. Ulnar and radial pulses palpable on left. Right forearm AVF with palpable thrill and  audible bruit.   Pulmonary/Chest: Effort normal and breath sounds normal. He has no wheezes. He has no rales. He exhibits no tenderness.  CP not reproducible  Abdominal: Soft. Bowel sounds are normal. He exhibits no distension, no abdominal bruit, no pulsatile midline mass and no mass. There is tenderness (epigastric and umbilicus). There is no rebound and no guarding. No hernia. Hernia confirmed negative in the ventral area.  obese  Musculoskeletal: He exhibits edema and tenderness.  (+) medial left epicondyle swelling and TTP. Reduced supination on left. L>R shoulder crepitus with ROM and reduced ROM on left; (+) left coracoid process TTP  Lymphadenopathy:    He has no cervical adenopathy.  Neurological: He is alert and oriented to person, place, and time.  Skin: Skin is warm and dry. No rash noted.  Multiple excoriations  Psychiatric: He has a normal mood and affect. His behavior is normal. Judgment and thought content normal.     Labs reviewed: Office Visit on 06/30/2015  Component Date Value Ref Range Status  . WBC 06/30/2015 6.1  4.0 - 10.5 K/uL Final  . RBC 06/30/2015 3.00* 4.22 - 5.81 MIL/uL Final  . Hemoglobin 06/30/2015 10.6* 13.0 - 17.0 g/dL Final  . HCT 06/30/2015 32.0* 39.0 - 52.0 % Final  . MCV 06/30/2015 106.7* 78.0 - 100.0 fL Final  . MCH 06/30/2015 35.3* 26.0 - 34.0 pg Final  . MCHC 06/30/2015 33.1  30.0 - 36.0 g/dL Final  . RDW 06/30/2015 16.4* 11.5 - 15.5 % Final  . Platelets 06/30/2015 196  150 - 400 K/uL Final  . MPV 06/30/2015 8.9  8.6 - 12.4 fL Final  . Neutrophils Relative % 06/30/2015 63  43 - 77 % Final  . Neutro Abs 06/30/2015 3.8  1.7 - 7.7 K/uL Final  . Lymphocytes Relative 06/30/2015 29  12 - 46 % Final  . Lymphs Abs 06/30/2015 1.8  0.7 - 4.0 K/uL Final  . Monocytes Relative 06/30/2015 7  3 - 12 % Final  . Monocytes Absolute 06/30/2015 0.4  0.1 - 1.0 K/uL Final  . Eosinophils Relative 06/30/2015 1  0 - 5 % Final  . Eosinophils Absolute 06/30/2015 0.1   0.0 - 0.7 K/uL Final  . Basophils Relative 06/30/2015 0  0 - 1 % Final  . Basophils Absolute 06/30/2015 0.0  0.0 - 0.1 K/uL Final  . Smear Review 06/30/2015 Criteria for review not met   Final  . Sodium 06/30/2015 136  135 - 146 mmol/L Final  . Potassium 06/30/2015 3.7  3.5 - 5.3 mmol/L Final  . Chloride 06/30/2015 95* 98 - 110 mmol/L Final  . CO2 06/30/2015 26  20 - 31 mmol/L Final  . Glucose, Bld 06/30/2015 107* 65 - 99 mg/dL Final  . BUN 06/30/2015 49* 7 - 25 mg/dL Final  . Creat 06/30/2015 8.42* 0.70 - 1.25 mg/dL Final   Result repeated and verified.  . Total Bilirubin 06/30/2015 0.7  0.2 - 1.2 mg/dL Final  . Alkaline Phosphatase 06/30/2015 68  40 - 115 U/L Final  . AST 06/30/2015 19  10 - 35 U/L Final  . ALT 06/30/2015 23  9 - 46 U/L Final  . Total Protein 06/30/2015 7.2  6.1 - 8.1 g/dL Final  . Albumin 06/30/2015 3.8  3.6 - 5.1 g/dL Final  . Calcium 06/30/2015 8.0* 8.6 - 10.3 mg/dL Final  . GFR, Est African  American 06/30/2015 7* >=60 mL/min Final  . GFR, Est Non African American 06/30/2015 6* >=60 mL/min Final   Comment:   The estimated GFR is a calculation valid for adults (>=63 years old) that uses the CKD-EPI algorithm to adjust for age and sex. It is   not to be used for children, pregnant women, hospitalized patients,    patients on dialysis, or with rapidly changing kidney function. According to the NKDEP, eGFR >89 is normal, 60-89 shows mild impairment, 30-59 shows moderate impairment, 15-29 shows severe impairment and <15 is ESRD.     Marland Kitchen HIV 1 RNA Quant 06/30/2015 <20  <20 copies/mL Final                                   DETECTED  . HIV1 RNA Quant, Log 06/30/2015 <1.30  <1.30 Log copies/mL Final   Comment:   This patient had an HIV-1 RNA viral load below the lower Limit of Quantitation for this assay (20 copies/mL), but above the lower Limit of Detection for the test. Because of the low viral load, we were unable to determine the actual level of the virus in this  patient's sample. Repeat testing may be warranted, when clinically indicated.   This test was performed using the COBAS AmpliPrep/COBAS TaqMan HIV-1 test kit version 2.0. ([place].)   . RPR Ser Ql 06/30/2015 NON REAC  NON REAC Final  . CD4 T Cell Abs 06/30/2015 560  400 - 2700 /uL Final  . CD4 % Helper T Cell 06/30/2015 33  33 - 55 % Final   Performed at [facility]  Office Visit on 05/21/2015  Component Date Value Ref Range Status  . Urine Culture, Routine 05/21/2015 Final report*  Final  . Urine Culture result 1 05/21/2015 Klebsiella pneumoniae*  Final   50,000-100,000 colony forming units per mL  . RESULT 2 05/21/2015 Comment   Final   Comment: Viridans streptococcus group 25,000-50,000 colony forming units per mL Susceptibility not normally performed on this organism.   . ANTIMICROBIAL SUSCEPTIBILITY 05/21/2015 Comment   Final   Comment:       ** S = Susceptible; I = Intermediate; R = Resistant **                    P = Positive; N = Negative             MICS are expressed in micrograms per mL    Antibiotic                 RSLT#1    RSLT#2    RSLT#3    RSLT#4 Amoxicillin/Clavulanic Acid    S Ampicillin                     R Cefepime                       S Ceftriaxone                    S Cefuroxime                     S Cephalothin                    S Ciprofloxacin  S Ertapenem                      S Gentamicin                     S Imipenem                       S Levofloxacin                   S Nitrofurantoin                 S Piperacillin                   S Tetracycline                   S Tobramycin                     S Trimethoprim/Sulfa             S   . Color, UA 05/21/2015 dark yellow   Final  . Clarity, UA 05/21/2015 cloudy   Final  . Glucose, UA 05/21/2015 Negative   Final  . Bilirubin, UA 05/21/2015 +   Final  . Ketones, UA 05/21/2015 Trace   Final  . Spec Grav, UA 05/21/2015 1.025   Final  .  Blood, UA 05/21/2015 large   Final  . pH, UA 05/21/2015 6.5   Final  . Protein, UA 05/21/2015 2000 or more   Final  . Urobilinogen, UA 05/21/2015 negative   Final  . Nitrite, UA 05/21/2015 negative   Final  . Leukocytes, UA 05/21/2015 moderate (2+)* Negative Final  Office Visit on 05/05/2015  Component Date Value Ref Range Status  . Specific Gravity, UA 05/05/2015 1.015  1.005 - 1.030 Final  . pH, UA 05/05/2015 7.0  5.0 - 7.5 Final  . Color, UA 05/05/2015 Yellow  Yellow Final  . Appearance Ur 05/05/2015 Turbid* Clear Final  . Leukocytes, UA 05/05/2015 3+* Negative Final  . Protein, UA 05/05/2015 3+* Negative/Trace Final  . Glucose, UA 05/05/2015 Negative  Negative Final  . Ketones, UA 05/05/2015 Negative  Negative Final  . RBC, UA 05/05/2015 2+* Negative Final  . Bilirubin, UA 05/05/2015 Negative  Negative Final  . Urobilinogen, Ur 05/05/2015 0.2  0.2 - 1.0 mg/dL Final  . Nitrite, UA 05/05/2015 Negative  Negative Final  . Microscopic Examination 05/05/2015 See below:   Final   Microscopic was indicated and was performed.  . WBC, UA 05/05/2015 >30* 0 -  5 /hpf Final  . RBC, UA 05/05/2015 >30* 0 -  2 /hpf Final  . Epithelial Cells (non renal) 05/05/2015 0-10  0 - 10 /hpf Final  . Casts 05/05/2015 None seen  None seen /lpf Final  . Mucus, UA 05/05/2015 Present  Not Estab. Final  . Bacteria, UA 05/05/2015 Few  None seen/Few Final    No results found.   Assessment/Plan   ICD-9-CM ICD-10-CM   1. Chest pain, unspecified chest pain type possibly musculoskeletal but r/o cardiac cause 786.50 R07.9 Ambulatory referral to Cardiology  2. Lump of skin of lower extremity, left - chronic and unchanged 782.2 R22.42 Ambulatory referral to Podiatry  3. Chronic atrial fibrillation (HCC) - stable 427.31 I48.2 Ambulatory referral to Cardiology  4. ESRD on dialysis ([place]) via right forearm AVF on TThSa - stable 585.6 N18.6    V45.11 Z99.2   5.  S/P repair of ventral hernia - has occasional abd  discomfort but overall stable V45.89 Z98.890     Z87.19      Increase gabapentin to 3 caps (326m total) 3 times daily  Will call with referrals to cardiology and podiatry  He will make appt to see vascular surgery to discuss possible removal of nonfunctioning grafts in left arm  Continue current medications as ordered  Watch abdominal pain - if increases or associated with nausea/vomiting, fever/chills, change in bowel/bladder habits, go to the ER  Already had flu shot for 2016  Use moisturizing lotion to skin liberally every day  Follow up as scheduled for routine visit  Evanee Lubrano S. CPerlie Gold PHosp [facility] [address] [address]([phone] (Monday-Friday 8 AM - 5 PM) ([phone] 5 PM and follow prompts

## 2015-07-25 DIAGNOSIS — R2240 Localized swelling, mass and lump, unspecified lower limb: Secondary | ICD-10-CM | POA: Insufficient documentation

## 2015-07-30 ENCOUNTER — Encounter: Payer: Self-pay | Admitting: Podiatry

## 2015-07-30 ENCOUNTER — Ambulatory Visit (INDEPENDENT_AMBULATORY_CARE_PROVIDER_SITE_OTHER): Payer: Medicare Other | Admitting: Podiatry

## 2015-07-30 VITALS — BP 142/64 | HR 86 | Resp 12

## 2015-07-30 DIAGNOSIS — M79673 Pain in unspecified foot: Secondary | ICD-10-CM

## 2015-07-30 DIAGNOSIS — B351 Tinea unguium: Secondary | ICD-10-CM | POA: Diagnosis not present

## 2015-07-30 DIAGNOSIS — G629 Polyneuropathy, unspecified: Secondary | ICD-10-CM | POA: Diagnosis not present

## 2015-07-30 DIAGNOSIS — M898X9 Other specified disorders of bone, unspecified site: Secondary | ICD-10-CM | POA: Diagnosis not present

## 2015-08-01 NOTE — Progress Notes (Signed)
Patient ID: Bryan Gilmore., male   DOB: July 10, 1951, 64 y.o.   MRN: [id number]  Subjective: 64 y.o. returns the office today for painful, elongated, thickened toenails which she is unable to trim himself. Denies any redness or drainage around the nails. He states he has continued have some pain, numbness and tingling mostly to the toes. He was taking 100 mg of gabapentin his primary care physician did increase to 300 mg 3 times a day for which he just started. He denies any open sores or any swelling or any redness to his feet. Denies any recent injury or trauma. Denies any acute changes since last appointment and no new complaints today. Denies any systemic complaints such as fevers, chills, nausea, vomiting.   Objective: AAO 3, NAD DP/PT pulses 1/4, CRT less than 3 seconds Protective sensation decreased with Simms Weinstein monofilament Nails hypertrophic, dystrophic, elongated, brittle, discolored 10. There is tenderness overlying the nails 1-5 bilaterally. There is no surrounding erythema or drainage along the nail sites. No open lesions or pre-ulcerative lesions are identified. No other areas of tenderness bilateral lower extremities. No overlying edema, erythema, increased warmth. There is a prominent bony exostosis off the lateral aspect the left foot without any skin irritation or breakdown at this time however due to the high pressure which continue to monitor closely. No pain with calf compression, swelling, warmth, erythema.  Assessment: Patient presents with symptomatic onychomycosis; neuropathy; exostosis left lateral foot.  Plan: -Treatment options including alternatives, risks, complications were discussed -Nails sharply debrided 10 without complication/bleeding. -Continue with gabapentin 300 mg 3 times a day as prescribed by his primary care physician. I also ordered a compound cream for neuropathy to see if this will help. -Recommend offloading pads for the bony exostosis due  to the high pressure area on the left foot. Monitor for any skin breakdown. -Discussed daily foot inspection. If there are any changes, to call the office immediately.  -Follow-up in 3 months or sooner if any problems are to arise. In the meantime, encouraged to call the office with any questions, concerns, changes symptoms.  Celesta Gentile, DPM

## 2015-08-04 ENCOUNTER — Encounter: Payer: Self-pay | Admitting: Nurse Practitioner

## 2015-08-04 ENCOUNTER — Ambulatory Visit (INDEPENDENT_AMBULATORY_CARE_PROVIDER_SITE_OTHER): Payer: Medicare Other | Admitting: Nurse Practitioner

## 2015-08-04 VITALS — BP 158/100 | HR 71 | Ht 71.0 in | Wt 228.6 lb

## 2015-08-04 DIAGNOSIS — B20 Human immunodeficiency virus [HIV] disease: Secondary | ICD-10-CM | POA: Diagnosis not present

## 2015-08-04 DIAGNOSIS — N186 End stage renal disease: Secondary | ICD-10-CM | POA: Diagnosis not present

## 2015-08-04 DIAGNOSIS — I482 Chronic atrial fibrillation: Secondary | ICD-10-CM | POA: Diagnosis not present

## 2015-08-04 DIAGNOSIS — R079 Chest pain, unspecified: Secondary | ICD-10-CM

## 2015-08-04 DIAGNOSIS — I4821 Permanent atrial fibrillation: Secondary | ICD-10-CM

## 2015-08-04 NOTE — Patient Instructions (Addendum)
We will be checking the following labs today - NONE   Medication Instructions:    Continue with your current medicines.     Testing/Procedures To Be Arranged:  Lexiscan Myoview  Follow-Up:   See back as needed.     Other Special Instructions:   N/A    If you need a refill on your cardiac medications before your next appointment, please call your pharmacy.   Call the [place] office at [phone] if you have any questions, problems or concerns.

## 2015-08-04 NOTE — Progress Notes (Signed)
CARDIOLOGY OFFICE NOTE  Date:  08/04/2015    Bryan Gilmore. Date of Birth: [birth year] Medical Record [id number]  PCP:  Gildardo Cranker, DO  Cardiologist:  Nishan/Jordan    Chief Complaint  Patient presents with  . Chest Pain    Work in visit - seen for Dr. Johnsie Cancel  . Atrial Fibrillation    History of Present Illness: Bryan Gilmore. is a 64 y.o. male who presents today for a work in visit. Seen for Dr. Johnsie Cancel but has also seen Dr. [name] in the past back in 2013 initially.   He has a significant PMH of chronic atrial fib -not a good candidate for anticoagulation due to comorbidities. He has ESRD, status post surgery in June of 2013 for Fournier's gangrene with debridement. He also had a Hartmann pouch with end colostomy.  Patient does have a history of hypertension and HIV positivity. He also has a history of hepatitis C and severe protein calorie malnutrition. His AF was noted back in 2013 while hospitalized.   Saw Dr. Johnsie Cancel last year. Seemed to be stable. Was not on all of his medicines. Coreg was called back in.   Comes in today. Here alone. Says he has had some minor pain in his left chest. Has been going on for a couple of months. Says he really does not pay much attention to it. He cannot raise left arm above shoulder and this will cause his chest hurt. Short of breath if bends over to tie shoes. He remains on dialysis. Not really active. Says his BP has been running just a little high - does not seem he has too much issues with dialysis with his BP. Has had his medicines this morning. He will get short of breath and feel a little tired with walking. He will have some chest pain if he goes up too many stairs. No regular aspirin therapy noted. CHADSVASC remains just 1 for HTN. No longer on Coreg - says this was changed by nephrology to Losartan.   Past Medical History  Diagnosis Date  . HIV (human immunodeficiency virus infection) ([place])   . Sepsis(995.91)   . Gout   .  Fournier's gangrene   . Severe protein-calorie malnutrition ([place])   . Alcohol abuse   . Dysrhythmia     AFib, not a candidate for coumadin , dr [name] signed off on pt  . Hypertension   . Colostomy status ([place])   . Sebaceous cyst   . Cough   . Unspecified constipation   . GERD (gastroesophageal reflux disease)   . Atrial fibrillation ([place])   . Ulcer of lower limb, unspecified   . Ulcer of lower limb, unspecified   . Other severe protein-calorie malnutrition   . Disorders of phosphorus metabolism   . Hypopotassemia   . Altered mental status   . Hemoptysis, unspecified   . Acute respiratory failure ([place])   . Gangrene ([place])   . Sepsis(995.91)   . Peripheral vascular disease, unspecified ([place])     denies  . Hypothyroidism   . Myocardial infarction [facility])     "told by a dr I had evidence of one that I didn't know about" (10/12/2014)  . Anemia   . Hepatitis C     "I've been treated for all of them" (10/12/2014)  . Hepatitis A   . Hepatitis B   . Gouty arthritis   . Arthritis     "joints" (10/12/2014)  . ESRD (end stage renal disease)  on dialysis [facility])     "TTS; [facility] " (10/12/2014)  . End stage renal disease ([place])   . Anemia in chronic kidney disease(285.21)     Past Surgical History  Procedure Laterality Date  . Cystoscopy with urethral dilatation  02/17/2012    Procedure: CYSTOSCOPY WITH URETHRAL DILATATION;  Surgeon: Reece Packer, MD;  Location: [facility];  Service: Urology;  Laterality: N/A;  retrograde urethragram, placement of foley catheter exam under anesthesia   . Insertion of dialysis catheter  02/20/2012    Procedure: INSERTION OF DIALYSIS CATHETER;  Surgeon: Angelia Mould, MD;  Location: [facility] OR;  Service: Vascular;  Laterality: N/A;  . Irrigation and debridement abscess  02/29/2012    Procedure: IRRIGATION AND DEBRIDEMENT ABSCESS;  Surgeon: Reece Packer, MD;  Location: [place];  Service: Urology;  Laterality: N/A;  I&D of scrotum abcess  .  Insertion of dialysis catheter  03/29/2012    Procedure: INSERTION OF DIALYSIS CATHETER;  Surgeon: Rosetta Posner, MD;  Location[facility] OR;  Service: Vascular;  Laterality: N/A;  Insertion tunneled dialysis catheter right IJ  . Av fistula placement  04/12/2012    Procedure: INSERTION OF ARTERIOVENOUS (AV) GORE-TEX GRAFT ARM;  Surgeon: Rosetta Posner, MD;  Location: [facility] OR;  Service: Vascular;  Laterality: Left;  insertion of 6x50 stretch goretex graft   . Thrombectomy and revision of arterioventous (av) goretex  graft  07/15/2012    Procedure: THROMBECTOMY AND REVISION OF ARTERIOVENTOUS (AV) GORETEX  GRAFT;  Surgeon: Elam Dutch, MD;  Location: [place];  Service: Vascular;  Laterality: Left;  . Urethrogram  07/30/2012    Procedure: URETHROGRAM;  Surgeon: Reece Packer, MD;  [facility];  Service: Urology;  Laterality: N/A;  cystogram  . Cystoscopy  07/30/2012    Procedure: CYSTOSCOPY;  Surgeon: Reece Packer, MD;  Location: [facility];  Service: Urology;  Laterality: Bilateral;  retrograde  . Insertion of dialysis catheter  08/04/2012    Procedure: INSERTION OF DIALYSIS CATHETER;  Surgeon: Angelia Mould, MD;  Location: [place];  Service: Vascular;  Laterality: Right;  right intrajugular hemodialysis catheter insertion  . Av fistula placement  08/14/2012    Procedure: INSERTION OF ARTERIOVENOUS (AV) GORE-TEX GRAFT ARM;  Surgeon: Mal Misty, MD;  Location: [place];  Service: Vascular;  Laterality: Left;  . Colostomy  02/2012    d/t an infection  . Av fistula placement Right 11/08/2012    Procedure: ARTERIOVENOUS (AV) FISTULA CREATION;  Surgeon: Angelia Mould, MD;  Location: [place];  Service: Vascular;  Laterality: Right;  . Surgery for fournier's gangrene  02/2012  . Colostomy  13  . Colostomy closure N/A 02/21/2013    Procedure: COLOSTOMY CLOSURE;  Surgeon: [name], MD;  Location: [place];  Service: General;  Laterality: N/A;  takedown of sigmoid colostomy with anastomosis    . Bowel resection N/A 02/21/2013    Procedure: SMALL BOWEL RESECTION;  Surgeon: [name], MD;  Location: [place];  Service: General;  Laterality: N/A;  . Fistulogram Right 01/06/2013    Procedure: FISTULOGRAM;  Surgeon: Angelia Mould, MD;  Location[facility] CATH LAB;  Service: Cardiovascular;  Laterality: Right;  . Ventral hernia repair  10/12/2014  . Hernia repair    . Ventral hernia repair N/A 10/12/2014    Procedure: REPAIR VENTRAL INCISIONAL HERNIA;  Surgeon: Excell Seltzer, MD;  Location: [place];  Service: General;  Laterality: N/A;  . Insertion of mesh N/A  10/12/2014    Procedure: INSERTION OF MESH;  Surgeon: Excell Seltzer, MD;  Location: [place];  Service: General;  Laterality: N/A;     Medications: Current Outpatient Prescriptions  Medication Sig Dispense Refill  . allopurinol (ZYLOPRIM) 300 MG tablet TAKE ONE TABLET BY MOUTH ONCE DAILY FOR GOUT 30 tablet 0  . darunavir-cobicistat (PREZCOBIX) 800-150 MG per tablet Take 1 tablet by mouth daily. Swallow whole. Take with food. Please D/C Prezista and Norvir. 30 tablet 11  . gabapentin (NEURONTIN) 300 MG capsule Take 1 capsule (300 mg total) by mouth 3 (three) times daily. 90 capsule 3  . ISENTRESS 400 MG tablet TAKE ONE TABLET BY MOUTH TWICE DAILY 60 tablet 10  . lamivudine (EPIVIR) 100 MG tablet Take 0.5 tablets (50 mg total) by mouth daily. 15 tablet 10  . lanthanum (FOSRENOL) 1000 MG chewable tablet Chew 1,000 mg by mouth 3 (three) times daily with meals.     Marland Kitchen losartan (COZAAR) 50 MG tablet Take 1 tablet by mouth daily.    Marland Kitchen nystatin (MYCOSTATIN) powder Apply topically 2 (two) times daily. To groin 60 g 0  . zidovudine (RETROVIR) 300 MG tablet TAKE ONE TABLET BY MOUTH EVERY DAY 30 tablet 10   No current facility-administered medications for this visit.    Allergies: No Known Allergies  Social History: The patient  reports that he has never smoked. He has never used smokeless tobacco. He reports that he does not  drink alcohol or use illicit drugs.   Family History: The patient's family history includes Cancer in his cousin; Hypertension in his father and mother.   Review of Systems: Please see the history of present illness.   Otherwise, the review of systems is positive for none.   All other systems are reviewed and negative.   Physical Exam: VS:  BP 158/100 mmHg  Pulse 71  Ht 5\' 11"  (1.803 m)  Wt 228 lb 9.6 oz (103.692 kg)  BMI 31.90 kg/m2 .  BMI Body mass index is 31.9 kg/(m^2).  Wt Readings from Last 3 Encounters:  08/04/15 228 lb 9.6 oz (103.692 kg)  07/23/15 229 lb (103.874 kg)  06/30/15 229 lb (103.874 kg)   BP is 110/60  General: Pleasant. Alert and in no acute distress.  HEENT: Normal but with missing teeth.  Neck: Supple, no JVD, carotid bruits, or masses noted.  Cardiac: Irregular irregular rhythm. No murmurs, rubs, or gallops. No edema.  Respiratory:  Lungs are clear to auscultation bilaterally with normal work of breathing.  GI: Soft and nontender.  MS: No deformity or atrophy. Gait and ROM intact. Skin: Warm and dry. Color is normal.  Neuro:  Strength and sensation are intact and no gross focal deficits noted.  Psych: Alert, appropriate and with normal affect.   LABORATORY DATA:  EKG:  EKG is ordered today. This demonstrates atrial fib with a controlled VR of 71.  Lab Results  Component Value Date   WBC 6.1 06/30/2015   HGB 10.6* 06/30/2015   HCT 32.0* 06/30/2015   PLT 196 06/30/2015   GLUCOSE 107* 06/30/2015   CHOL 167 06/24/2014   TRIG 113 06/24/2014   HDL 44 06/24/2014   LDLCALC 100* 06/24/2014   ALT 23 06/30/2015   AST 19 06/30/2015   NA 136 06/30/2015   K 3.7 06/30/2015   CL 95* 06/30/2015   CREATININE 8.42* 06/30/2015   BUN 49* 06/30/2015   CO2 26 06/30/2015   TSH 5.597* 12/22/2014   INR 0.96 08/27/2013   HGBA1C  4.7* 12/22/2014    BNP (last 3 results) No results for input(s): BNP in the last 8760 hours.  ProBNP (last 3 results) No results  for input(s): PROBNP in the last 8760 hours.   Other Studies Reviewed Today:  Echo Study Conclusions from 2016  - Left ventricle: Abnormal GLS -9. The cavity size was normal. Wall thickness was increased in a pattern of moderate LVH. Systolic function was normal. The estimated ejection fraction was in the range of 50% to 55%. Wall motion was normal; there were no regional wall motion abnormalities. - Left atrium: The atrium was moderately dilated.  Assessment/Plan: 1. Chest pain - some exertional quality - but also with trying to raise left arm - suspect this is more related to rotator cuff issue. Also with some shortness of breath/fatigue - EKG with nonspecific ST/T wave changes - will arrange for Lexiscan. May end up needing pre op clearance.  2. HTN - recheck by me is much better - I have left him on his current regimen  3. Chronic atrial fib - rate is controlled  4. Multiple co-morbidities - not a good candidate for anticoagulation. Taking aspirin sporadically.   Current medicines are reviewed with the patient today.  The patient does not have concerns regarding medicines other than what has been noted above.  The following changes have been made:  See above.  Labs/ tests ordered today include:    Orders Placed This Encounter  Procedures  . Myocardial Perfusion Imaging  . EKG 12-Lead     Disposition:   FU prn unless stress test is abnormal.    Patient is agreeable to this plan and will call if any problems develop in the interim.   Signed: Burtis Junes, RN, ANP-C 08/04/2015 11:18 AM  [place] [address] [place], [place]  [address] Phone: [phone] Fax: [phone]

## 2015-08-12 DIAGNOSIS — D631 Anemia in chronic kidney disease: Secondary | ICD-10-CM | POA: Diagnosis not present

## 2015-08-12 DIAGNOSIS — D509 Iron deficiency anemia, unspecified: Secondary | ICD-10-CM | POA: Diagnosis not present

## 2015-08-12 DIAGNOSIS — N186 End stage renal disease: Secondary | ICD-10-CM | POA: Diagnosis not present

## 2015-08-12 DIAGNOSIS — N2581 Secondary hyperparathyroidism of renal origin: Secondary | ICD-10-CM | POA: Diagnosis not present

## 2015-08-13 ENCOUNTER — Encounter: Payer: Self-pay | Admitting: Vascular Surgery

## 2015-08-14 DIAGNOSIS — N2581 Secondary hyperparathyroidism of renal origin: Secondary | ICD-10-CM | POA: Diagnosis not present

## 2015-08-14 DIAGNOSIS — N186 End stage renal disease: Secondary | ICD-10-CM | POA: Diagnosis not present

## 2015-08-14 DIAGNOSIS — D631 Anemia in chronic kidney disease: Secondary | ICD-10-CM | POA: Diagnosis not present

## 2015-08-14 DIAGNOSIS — D509 Iron deficiency anemia, unspecified: Secondary | ICD-10-CM | POA: Diagnosis not present

## 2015-08-17 DIAGNOSIS — D631 Anemia in chronic kidney disease: Secondary | ICD-10-CM | POA: Diagnosis not present

## 2015-08-17 DIAGNOSIS — D509 Iron deficiency anemia, unspecified: Secondary | ICD-10-CM | POA: Diagnosis not present

## 2015-08-17 DIAGNOSIS — N2581 Secondary hyperparathyroidism of renal origin: Secondary | ICD-10-CM | POA: Diagnosis not present

## 2015-08-17 DIAGNOSIS — N186 End stage renal disease: Secondary | ICD-10-CM | POA: Diagnosis not present

## 2015-08-18 ENCOUNTER — Encounter: Payer: Self-pay | Admitting: Vascular Surgery

## 2015-08-18 ENCOUNTER — Telehealth (HOSPITAL_COMMUNITY): Payer: Self-pay | Admitting: *Deleted

## 2015-08-18 ENCOUNTER — Ambulatory Visit (INDEPENDENT_AMBULATORY_CARE_PROVIDER_SITE_OTHER): Payer: Medicare Other | Admitting: Vascular Surgery

## 2015-08-18 VITALS — BP 122/77 | HR 71 | Temp 97.8°F | Resp 16 | Ht 71.0 in | Wt 231.0 lb

## 2015-08-18 DIAGNOSIS — N185 Chronic kidney disease, stage 5: Secondary | ICD-10-CM

## 2015-08-18 NOTE — Progress Notes (Signed)
Filed Vitals:   08/18/15 1003 08/18/15 1009  BP: 141/82 122/77  Pulse: 80 71  Temp: 97.8 F (36.6 C)   TempSrc: Oral   Resp: 16   Height: 5\' 11"  (1.803 m)   Weight: 231 lb (104.781 kg)   SpO2: 100%

## 2015-08-18 NOTE — Telephone Encounter (Signed)
Patient given detailed instructions per Myocardial Perfusion Study Information Sheet for the test on 08/23/15 at 1000. Patient notified to arrive 15 minutes early and that it is imperative to arrive on time for appointment to keep from having the test rescheduled.  If you need to cancel or reschedule your appointment, please call the office within 24 hours of your appointment. Failure to do so may result in a cancellation of your appointment, and a $50 no show fee. Patient verbalized understanding.Hubbard Robinson, RN

## 2015-08-18 NOTE — Progress Notes (Signed)
[facility] [place]  Patient name: Bryan Gilmore. MRN: [id number] DOB: 30-Apr-1951 Sex: male  REASON FOR VISIT: Pain in the old left upper extremity AV fistula. Referred by Dr. Pearson Grippe  HPI: Yogesh Smoker. is a 64 y.o. male whom I have not seen since May 2014. At that time, I was following his right radiocephalic AV fistula which was placed in February 2014. He dialyzes on Tuesdays Thursdays and Saturdays and his fistula in the right forearm has been working well. He has a forearm graft on the left and an upper arm graft on the left which are not functioning. He states that he has had some pain in the left upper arm graft. He also describes pain in the shoulder and is unable to raise his left arm above his head. He is trying to get an appointment with one of the orthopedic surgeons to evaluate his shoulder issues. He denies fever or chills.  I have reviewed the records that were sent from [facility].  Past Medical History  Diagnosis Date  . HIV (human immunodeficiency virus infection) ([place])   . Sepsis(995.91)   . Gout   . Fournier's gangrene   . Severe protein-calorie malnutrition ([place])   . Alcohol abuse   . Dysrhythmia     AFib, not a candidate for coumadin , dr [name] signed off on pt  . Hypertension   . Colostomy status ([place])   . Sebaceous cyst   . Cough   . Unspecified constipation   . GERD (gastroesophageal reflux disease)   . Atrial fibrillation ([place])   . Ulcer of lower limb, unspecified   . Ulcer of lower limb, unspecified   . Other severe protein-calorie malnutrition   . Disorders of phosphorus metabolism   . Hypopotassemia   . Altered mental status   . Hemoptysis, unspecified   . Acute respiratory failure ([place])   . Gangrene ([place])   . Sepsis(995.91)   . Peripheral vascular disease, unspecified ([place])     denies  . Hypothyroidism   . Myocardial infarction [facility])     "told by a dr I had evidence of one that I didn't know  about" (10/12/2014)  . Anemia   . Hepatitis C     "I've been treated for all of them" (10/12/2014)  . Hepatitis A   . Hepatitis B   . Gouty arthritis   . Arthritis     "joints" (10/12/2014)  . ESRD (end stage renal disease) on dialysis ([place])     "TTS; [facility] " (10/12/2014)  . End stage renal disease ([place])   . Anemia in chronic kidney disease(285.21)     Family History  Problem Relation Age of Onset  . Hypertension Mother   . Hypertension Father   . Cancer Cousin     breast    SOCIAL HISTORY: Social History  Substance Use Topics  . Smoking status: Never Smoker   . Smokeless tobacco: Never Used  . Alcohol Use: No    No Known Allergies  Current Outpatient Prescriptions  Medication Sig Dispense Refill  . allopurinol (ZYLOPRIM) 300 MG tablet TAKE ONE TABLET BY MOUTH ONCE DAILY FOR GOUT 30 tablet 0  . darunavir-cobicistat (PREZCOBIX) 800-150 MG per tablet Take 1 tablet by mouth daily. Swallow whole. Take with food. Please D/C Prezista and Norvir. 30 tablet 11  . gabapentin (NEURONTIN) 300 MG capsule Take 1 capsule (300 mg total) by mouth 3 (three) times daily. 90 capsule 3  .  ISENTRESS 400 MG tablet TAKE ONE TABLET BY MOUTH TWICE DAILY 60 tablet 10  . lamivudine (EPIVIR) 100 MG tablet Take 0.5 tablets (50 mg total) by mouth daily. 15 tablet 10  . lanthanum (FOSRENOL) 1000 MG chewable tablet Chew 1,000 mg by mouth 3 (three) times daily with meals.     Marland Kitchen losartan (COZAAR) 50 MG tablet Take 1 tablet by mouth daily.    Marland Kitchen nystatin (MYCOSTATIN) powder Apply topically 2 (two) times daily. To groin 60 g 0  . zidovudine (RETROVIR) 300 MG tablet TAKE ONE TABLET BY MOUTH EVERY DAY 30 tablet 10   No current facility-administered medications for this visit.    REVIEW OF SYSTEMS:  [X]  denotes positive finding, [ ]  denotes negative finding Cardiac  Comments:  Chest pain or chest pressure:    Shortness of breath upon exertion:    Short of breath when lying flat:    Irregular  heart rhythm:        Vascular    Pain in calf, thigh, or hip brought on by ambulation:    Pain in feet at night that wakes you up from your sleep:     Blood clot in your veins:    Leg swelling:         Pulmonary    Oxygen at home:    Productive cough:     Wheezing:         Neurologic    Sudden weakness in arms or legs:     Sudden numbness in arms or legs:     Sudden onset of difficulty speaking or slurred speech:    Temporary loss of vision in one eye:     Problems with dizziness:         Gastrointestinal    Blood in stool:     Vomited blood:         Genitourinary    Burning when urinating:     Blood in urine:        Psychiatric    Major depression:         Hematologic    Bleeding problems:    Problems with blood clotting too easily:        Skin    Rashes or ulcers:        Constitutional    Fever or chills:      PHYSICAL EXAM: Filed Vitals:   08/18/15 1003  BP: 141/82  Pulse: 80  Temp: 97.8 F (36.6 C)  TempSrc: Oral  Resp: 16  Height: 5\' 11"  (1.803 m)  Weight: 231 lb (104.781 kg)  SpO2: 100%    GENERAL: The patient is a well-nourished male, in no acute distress. The vital signs are documented above. CARDIAC: There is a regular rate and rhythm.  VASCULAR: he has palpable radial pulses bilaterally. He has a brisk radial, ulnar, and palmaris arch signal with the Doppler in the left hand. PULMONARY: There is good air exchange bilaterally without wheezing or rales. ABDOMEN: Soft and non-tender with normal pitched bowel sounds.  MUSCULOSKELETAL: There are no major deformities or cyanosis. NEUROLOGIC: No focal weakness or paresthesias are detected. SKIN: There are no ulcers or rashes noted.he has a nonfunctioning graft in his left forearm and also left upper arm. There is no swelling, fluctuance, or erythema to suggest infection. PSYCHIATRIC: The patient has a normal affect.  MEDICAL ISSUES:  LEFT ARM PAIN: I do not think that the pain he is having in his  arm is related to his AV  grafts in the left arm which are nonfunctional. He has excellent circulation in the left arm. There are no signs of infection. The biggest issue appears to be his left shoulder and this is possibly referred pain from the shoulder. His right forearm AV fistula is working well. I will see him back as needed.  HYPERTENSION: The patient's initial blood pressure today was elevated. We repeated this and this was still elevated. We have encouraged the patient to follow up with their primary care physician for management of their blood pressure.   Deitra Mayo [facility] [place]: [phone]

## 2015-08-19 DIAGNOSIS — N186 End stage renal disease: Secondary | ICD-10-CM | POA: Diagnosis not present

## 2015-08-19 DIAGNOSIS — D509 Iron deficiency anemia, unspecified: Secondary | ICD-10-CM | POA: Diagnosis not present

## 2015-08-19 DIAGNOSIS — N2581 Secondary hyperparathyroidism of renal origin: Secondary | ICD-10-CM | POA: Diagnosis not present

## 2015-08-19 DIAGNOSIS — D631 Anemia in chronic kidney disease: Secondary | ICD-10-CM | POA: Diagnosis not present

## 2015-08-20 ENCOUNTER — Encounter: Payer: Self-pay | Admitting: Nephrology

## 2015-08-21 DIAGNOSIS — N2581 Secondary hyperparathyroidism of renal origin: Secondary | ICD-10-CM | POA: Diagnosis not present

## 2015-08-21 DIAGNOSIS — N186 End stage renal disease: Secondary | ICD-10-CM | POA: Diagnosis not present

## 2015-08-21 DIAGNOSIS — D631 Anemia in chronic kidney disease: Secondary | ICD-10-CM | POA: Diagnosis not present

## 2015-08-21 DIAGNOSIS — D509 Iron deficiency anemia, unspecified: Secondary | ICD-10-CM | POA: Diagnosis not present

## 2015-08-23 ENCOUNTER — Ambulatory Visit (HOSPITAL_COMMUNITY): Payer: Medicare Other | Attending: Cardiology

## 2015-08-23 DIAGNOSIS — R0609 Other forms of dyspnea: Secondary | ICD-10-CM | POA: Insufficient documentation

## 2015-08-23 DIAGNOSIS — R9439 Abnormal result of other cardiovascular function study: Secondary | ICD-10-CM | POA: Diagnosis not present

## 2015-08-23 DIAGNOSIS — N186 End stage renal disease: Secondary | ICD-10-CM | POA: Diagnosis not present

## 2015-08-23 DIAGNOSIS — R079 Chest pain, unspecified: Secondary | ICD-10-CM | POA: Insufficient documentation

## 2015-08-23 DIAGNOSIS — B20 Human immunodeficiency virus [HIV] disease: Secondary | ICD-10-CM | POA: Diagnosis not present

## 2015-08-23 DIAGNOSIS — I120 Hypertensive chronic kidney disease with stage 5 chronic kidney disease or end stage renal disease: Secondary | ICD-10-CM | POA: Diagnosis not present

## 2015-08-23 DIAGNOSIS — I4821 Permanent atrial fibrillation: Secondary | ICD-10-CM

## 2015-08-23 DIAGNOSIS — I482 Chronic atrial fibrillation: Secondary | ICD-10-CM | POA: Insufficient documentation

## 2015-08-23 LAB — MYOCARDIAL PERFUSION IMAGING
LV dias vol: 129 mL
LV sys vol: 66 mL
Peak HR: 80 {beats}/min
RATE: 0.34
Rest HR: 65 {beats}/min
SDS: 0
SRS: 7
SSS: 7
TID: 0.97

## 2015-08-23 MED ORDER — TECHNETIUM TC 99M SESTAMIBI GENERIC - CARDIOLITE
31.0000 | Freq: Once | INTRAVENOUS | Status: AC | PRN
  Administered 2015-08-23: 31 via INTRAVENOUS

## 2015-08-23 MED ORDER — REGADENOSON 0.4 MG/5ML IV SOLN
0.4000 mg | Freq: Once | INTRAVENOUS | Status: AC
  Administered 2015-08-23: 0.4 mg via INTRAVENOUS

## 2015-08-23 MED ORDER — TECHNETIUM TC 99M SESTAMIBI GENERIC - CARDIOLITE
10.6000 | Freq: Once | INTRAVENOUS | Status: AC | PRN
  Administered 2015-08-23: 11 via INTRAVENOUS

## 2015-08-24 DIAGNOSIS — D631 Anemia in chronic kidney disease: Secondary | ICD-10-CM | POA: Diagnosis not present

## 2015-08-24 DIAGNOSIS — D509 Iron deficiency anemia, unspecified: Secondary | ICD-10-CM | POA: Diagnosis not present

## 2015-08-24 DIAGNOSIS — N186 End stage renal disease: Secondary | ICD-10-CM | POA: Diagnosis not present

## 2015-08-24 DIAGNOSIS — N2581 Secondary hyperparathyroidism of renal origin: Secondary | ICD-10-CM | POA: Diagnosis not present

## 2015-08-25 ENCOUNTER — Ambulatory Visit (INDEPENDENT_AMBULATORY_CARE_PROVIDER_SITE_OTHER): Payer: Medicare Other | Admitting: Internal Medicine

## 2015-08-25 ENCOUNTER — Encounter: Payer: Self-pay | Admitting: Internal Medicine

## 2015-08-25 VITALS — BP 130/78 | HR 74 | Temp 97.5°F | Ht 71.0 in | Wt 232.0 lb

## 2015-08-25 DIAGNOSIS — E039 Hypothyroidism, unspecified: Secondary | ICD-10-CM | POA: Diagnosis not present

## 2015-08-25 DIAGNOSIS — Z23 Encounter for immunization: Secondary | ICD-10-CM

## 2015-08-25 DIAGNOSIS — I252 Old myocardial infarction: Secondary | ICD-10-CM | POA: Diagnosis not present

## 2015-08-25 DIAGNOSIS — N186 End stage renal disease: Secondary | ICD-10-CM

## 2015-08-25 DIAGNOSIS — B20 Human immunodeficiency virus [HIV] disease: Secondary | ICD-10-CM

## 2015-08-25 DIAGNOSIS — I10 Essential (primary) hypertension: Secondary | ICD-10-CM | POA: Diagnosis not present

## 2015-08-25 DIAGNOSIS — Z992 Dependence on renal dialysis: Secondary | ICD-10-CM | POA: Diagnosis not present

## 2015-08-25 DIAGNOSIS — Z1211 Encounter for screening for malignant neoplasm of colon: Secondary | ICD-10-CM

## 2015-08-25 DIAGNOSIS — I482 Chronic atrial fibrillation, unspecified: Secondary | ICD-10-CM

## 2015-08-25 MED ORDER — CARVEDILOL 3.125 MG PO TABS: 3.1250 mg | ORAL_TABLET | Freq: Two times a day (BID) | ORAL | Status: DC

## 2015-08-25 NOTE — Patient Instructions (Addendum)
Resume coreg at lower dose twice daily  Continue other medications as ordered  Will call with lab results  Prevnar vaccine given today  Follow up with specialists as scheduled  Follow up in 4 mos for routine visit.

## 2015-08-25 NOTE — Progress Notes (Signed)
Patient ID: Bryan Gilmore., male   DOB: 1950/12/07, 64 y.o.   MRN: [id number]    Location:    [facility]   Place of Service:  OFFICE   Chief Complaint  Patient presents with  . Medical Management of Chronic Issues    4 month follow-up: HIV, thyroid disease, gout, HTN and discuss getting rx for ibuprofen   . Foot Pain    Patient c/o foot pain x 1 week, no known iunjury   . Immunizations    Prevnar 13 today   . Orders    Colonoscopy     HPI:  64 yo male seen today for f/u. He c/o 1 week hx right foot pain. He has a hx gout. He is thinking about taking 890m ibuprofen. He is scheduled to see ortho next week for shoulder pain and reduced ROM. No other concerns  Skin rash - stable on nystatin powder.  He continues to itch.  afib - rate controlled. Currently NOT taking coreg  HTN - stable on losartan. Hydralazine and coreg stopped by nephro  CP - had nuclear stress test earlier this week that showed fixed defect apical and inferior wall c/w prior infarct; EF 48%.  ESRD/HD - TThSa via right forearm AVF; no issues. Takes fosrenal  Gout - no attacks since his last visit. Takes allopurinol  HIV - followed by Dr SBaxter Flattery CD4 >600 about 1 month ago. Viral load undetectable. He is on 4 HAART meds.  Hypothyroidism - no cold intolerance/palpitations. He takes levothyroxine  Past Medical History  Diagnosis Date  . HIV (human immunodeficiency virus infection) ([place]   . Sepsis(995.91)   . Gout   . Fournier's gangrene   . Severe protein-calorie malnutrition ([place]   . Alcohol abuse   . Dysrhythmia     AFib, not a candidate for coumadin , dr jMartiniquesigned off on pt  . Hypertension   . Colostomy status ([place]   . Sebaceous cyst   . Cough   . Unspecified constipation   . GERD (gastroesophageal reflux disease)   . Atrial fibrillation ([place]   . Ulcer of lower limb, unspecified   . Ulcer of lower limb, unspecified   . Other severe protein-calorie malnutrition   . Disorders of phosphorus  metabolism   . Hypopotassemia   . Altered mental status   . Hemoptysis, unspecified   . Acute respiratory failure ([place]   . Gangrene ([place]   . Sepsis(995.91)   . Peripheral vascular disease, unspecified ([place]     denies  . Hypothyroidism   . Myocardial infarction ([facility]     "told by a dr I had evidence of one that I didn't know about" (10/12/2014)  . Anemia   . Hepatitis C     "I've been treated for all of them" (10/12/2014)  . Hepatitis A   . Hepatitis B   . Gouty arthritis   . Arthritis     "joints" (10/12/2014)  . ESRD (end stage renal disease) on dialysis ([place]     "TTS; [facility] " (10/12/2014)  . End stage renal disease ([place]   . Anemia in chronic kidney disease(285.21)     Past Surgical History  Procedure Laterality Date  . Cystoscopy with urethral dilatation  02/17/2012    Procedure: CYSTOSCOPY WITH URETHRAL DILATATION;  Surgeon: SReece Packer MD;  [facility];  Service: Urology;  Laterality: N/A;  retrograde urethragram, placement of foley catheter exam under anesthesia   . Insertion of dialysis catheter  02/20/2012    Procedure: INSERTION OF DIALYSIS CATHETER;  Surgeon: Angelia Mould, MD;  Location: [facility] OR;  Service: Vascular;  Laterality: N/A;  . Irrigation and debridement abscess  02/29/2012    Procedure: IRRIGATION AND DEBRIDEMENT ABSCESS;  Surgeon: Reece Packer, MD;  Location: [place];  Service: Urology;  Laterality: N/A;  I&D of scrotum abcess  . Insertion of dialysis catheter  03/29/2012    Procedure: INSERTION OF DIALYSIS CATHETER;  Surgeon: Rosetta Posner, MD;  Location: [facility] OR;  Service: Vascular;  Laterality: N/A;  Insertion tunneled dialysis catheter right IJ  . Av fistula placement  04/12/2012    Procedure: INSERTION OF ARTERIOVENOUS (AV) GORE-TEX GRAFT ARM;  Surgeon: Rosetta Posner, MD;  Location: [facility] OR;  Service: Vascular;  Laterality: Left;  insertion of 6x50 stretch goretex graft   . Thrombectomy and revision of arterioventous (av)  goretex  graft  07/15/2012    Procedure: THROMBECTOMY AND REVISION OF ARTERIOVENTOUS (AV) GORETEX  GRAFT;  Surgeon: Elam Dutch, MD;  Location: [place];  Service: Vascular;  Laterality: Left;  . Urethrogram  07/30/2012    Procedure: URETHROGRAM;  Surgeon: Reece Packer, MD;  [facility];  Service: Urology;  Laterality: N/A;  cystogram  . Cystoscopy  07/30/2012    Procedure: CYSTOSCOPY;  Surgeon: Reece Packer, MD;  [facility];  Service: Urology;  Laterality: Bilateral;  retrograde  . Insertion of dialysis catheter  08/04/2012    Procedure: INSERTION OF DIALYSIS CATHETER;  Surgeon: Angelia Mould, MD;  Location: [place];  Service: Vascular;  Laterality: Right;  right intrajugular hemodialysis catheter insertion  . Av fistula placement  08/14/2012    Procedure: INSERTION OF ARTERIOVENOUS (AV) GORE-TEX GRAFT ARM;  Surgeon: Mal Misty, MD;  Location: [place];  Service: Vascular;  Laterality: Left;  . Colostomy  02/2012    d/t an infection  . Av fistula placement Right 11/08/2012    Procedure: ARTERIOVENOUS (AV) FISTULA CREATION;  Surgeon: Angelia Mould, MD;  Location: [place];  Service: Vascular;  Laterality: Right;  . Surgery for fournier's gangrene  02/2012  . Colostomy  13  . Colostomy closure N/A 02/21/2013    Procedure: COLOSTOMY CLOSURE;  Surgeon: [name], MD;  Location: [place];  Service: General;  Laterality: N/A;  takedown of sigmoid colostomy with anastomosis  . Bowel resection N/A 02/21/2013    Procedure: SMALL BOWEL RESECTION;  Surgeon: [name], MD;  Location: [place];  Service: General;  Laterality: N/A;  . Fistulogram Right 01/06/2013    Procedure: FISTULOGRAM;  Surgeon: Angelia Mould, MD;  Location: [facility] CATH LAB;  Service: Cardiovascular;  Laterality: Right;  . Ventral hernia repair  10/12/2014  . Hernia repair    . Ventral hernia repair N/A 10/12/2014    Procedure: REPAIR VENTRAL INCISIONAL HERNIA;  Surgeon: Excell Seltzer,  MD;  Location: [place];  Service: General;  Laterality: N/A;  . Insertion of mesh N/A 10/12/2014    Procedure: INSERTION OF MESH;  Surgeon: Excell Seltzer, MD;  Location: [place];  Service: General;  Laterality: N/A;    Patient Care Team: Gildardo Cranker, DO as PCP - General (Internal Medicine) Thayer Headings, MD as PCP - Infectious Diseases (Infectious Diseases) Minda Ditto, Rad Tech as Technician Corliss Parish, MD as Consulting Physician (Nephrology)  Social History   Social History  . Marital Status: Married    Spouse Name: N/A  . Number of Children: 0  . Years of  Education: N/A   Occupational History  . [employer]   Social History Main Topics  . Smoking status: Never Smoker   . Smokeless tobacco: Never Used  . Alcohol Use: No  . Drug Use: No  . Sexual Activity: No     Comment: declined condoms   Other Topics Concern  . Not on file   Social History Narrative   Single.  Unemployed.  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       reports that he has never smoked. He has never used smokeless tobacco. He reports that he does not drink alcohol or use illicit drugs.  No Known Allergies  Medications: Patient's Medications  New Prescriptions   No medications on file  Previous Medications   ALLOPURINOL (ZYLOPRIM) 300 MG TABLET    TAKE ONE TABLET BY MOUTH ONCE DAILY FOR GOUT   DARUNAVIR-COBICISTAT (PREZCOBIX) 800-150 MG PER TABLET    Take 1 tablet by mouth daily. Swallow whole. Take with food. Please D/C Prezista and Norvir.   GABAPENTIN (NEURONTIN) 300 MG CAPSULE    Take 1 capsule (300 mg total) by mouth 3 (three) times daily.   IBUPROFEN (ADVIL,MOTRIN) 800 MG TABLET    Take 800 mg by mouth every 8 (eight) hours as needed.   ISENTRESS 400 MG TABLET    TAKE ONE TABLET BY MOUTH TWICE DAILY   LAMIVUDINE (EPIVIR) 100 MG TABLET    Take 0.5 tablets (50 mg total) by mouth daily.   LANTHANUM (FOSRENOL) 1000 MG CHEWABLE TABLET    Chew 1,000 mg by mouth  3 (three) times daily with meals.    LOSARTAN (COZAAR) 50 MG TABLET    Take 1 tablet by mouth daily.   NYSTATIN (MYCOSTATIN) POWDER    Apply topically 2 (two) times daily. To groin   ZIDOVUDINE (RETROVIR) 300 MG TABLET    TAKE ONE TABLET BY MOUTH EVERY DAY  Modified Medications   No medications on file  Discontinued Medications   No medications on file    Review of Systems  Constitutional: Negative for chills, activity change and fatigue.  HENT: Negative for sore throat and trouble swallowing.   Eyes: Negative for visual disturbance.  Respiratory: Negative for cough, chest tightness and shortness of breath.   Cardiovascular: Negative for chest pain, palpitations and leg swelling.  Gastrointestinal: Negative for nausea, vomiting, abdominal pain and blood in stool.  Genitourinary: Negative for urgency, frequency and difficulty urinating.  Musculoskeletal: Positive for arthralgias. Negative for gait problem.  Skin: Negative for rash.  Neurological: Negative for weakness and headaches.  Psychiatric/Behavioral: Negative for confusion and sleep disturbance. The patient is not nervous/anxious.     Filed Vitals:   08/25/15 0823  BP: 130/78  Pulse: 74  Temp: 97.5 F (36.4 C)  TempSrc: Oral  Height: '5\' 11"'  (1.803 m)  Weight: 232 lb (105.235 kg)  SpO2: 99%   Body mass index is 32.37 kg/(m^2).  Physical Exam  Constitutional: He is oriented to person, place, and time. He appears well-developed and well-nourished. No distress.  HENT:  Mouth/Throat: Oropharynx is clear and moist.  Eyes: Pupils are equal, round, and reactive to light. No scleral icterus.  Neck: Neck supple. Carotid bruit is not present. No thyromegaly present.  Cardiovascular: Normal rate and intact distal pulses.  An irregularly irregular rhythm present. Exam reveals no gallop and no friction rub.   Murmur heard.  Systolic murmur is present with a grade of 1/6  no distal  LE swelling. No calf TTP. Right forearm AVF  palpable thrill/audible bruit  Pulmonary/Chest: Effort normal and breath sounds normal. He has no wheezes. He has no rales. He exhibits no tenderness.  Abdominal: Soft. Bowel sounds are normal. He exhibits no distension, no abdominal bruit, no pulsatile midline mass and no mass. There is tenderness (generalized). There is no rebound and no guarding.  Musculoskeletal: He exhibits edema and tenderness.  Lymphadenopathy:    He has no cervical adenopathy.  Neurological: He is alert and oriented to person, place, and time.  Skin: Skin is warm and dry. Rash noted.  Psychiatric: He has a normal mood and affect. His behavior is normal. Judgment and thought content normal.     Labs reviewed: Appointment on 08/23/2015  Component Date Value Ref Range Status  . Rest HR 08/23/2015 65   Final  . Rest BP 08/23/2015 127/93   Final  . Peak HR 08/23/2015 80   Final  . Peak BP 08/23/2015 132/81   Final  . LV Systolic Volume 79/10/4095 66   Final  . TID 08/23/2015 0.97   Final  . LV Diastolic Volume 35/32/9924 129   Final  . LHR 08/23/2015 0.34   Final  . SSS 08/23/2015 7   Final  . SRS 08/23/2015 7   Final  . SDS 08/23/2015 0   Final  Office Visit on 06/30/2015  Component Date Value Ref Range Status  . WBC 06/30/2015 6.1  4.0 - 10.5 K/uL Final  . RBC 06/30/2015 3.00* 4.22 - 5.81 MIL/uL Final  . Hemoglobin 06/30/2015 10.6* 13.0 - 17.0 g/dL Final  . HCT 06/30/2015 32.0* 39.0 - 52.0 % Final  . MCV 06/30/2015 106.7* 78.0 - 100.0 fL Final  . MCH 06/30/2015 35.3* 26.0 - 34.0 pg Final  . MCHC 06/30/2015 33.1  30.0 - 36.0 g/dL Final  . RDW 06/30/2015 16.4* 11.5 - 15.5 % Final  . Platelets 06/30/2015 196  150 - 400 K/uL Final  . MPV 06/30/2015 8.9  8.6 - 12.4 fL Final  . Neutrophils Relative % 06/30/2015 63  43 - 77 % Final  . Neutro Abs 06/30/2015 3.8  1.7 - 7.7 K/uL Final  . Lymphocytes Relative 06/30/2015 29  12 - 46 % Final  . Lymphs Abs 06/30/2015 1.8  0.7 - 4.0 K/uL Final  . Monocytes Relative  06/30/2015 7  3 - 12 % Final  . Monocytes Absolute 06/30/2015 0.4  0.1 - 1.0 K/uL Final  . Eosinophils Relative 06/30/2015 1  0 - 5 % Final  . Eosinophils Absolute 06/30/2015 0.1  0.0 - 0.7 K/uL Final  . Basophils Relative 06/30/2015 0  0 - 1 % Final  . Basophils Absolute 06/30/2015 0.0  0.0 - 0.1 K/uL Final  . Smear Review 06/30/2015 Criteria for review not met   Final  . Sodium 06/30/2015 136  135 - 146 mmol/L Final  . Potassium 06/30/2015 3.7  3.5 - 5.3 mmol/L Final  . Chloride 06/30/2015 95* 98 - 110 mmol/L Final  . CO2 06/30/2015 26  20 - 31 mmol/L Final  . Glucose, Bld 06/30/2015 107* 65 - 99 mg/dL Final  . BUN 06/30/2015 49* 7 - 25 mg/dL Final  . Creat 06/30/2015 8.42* 0.70 - 1.25 mg/dL Final   Result repeated and verified.  . Total Bilirubin 06/30/2015 0.7  0.2 - 1.2 mg/dL Final  . Alkaline Phosphatase 06/30/2015 68  40 - 115 U/L Final  . AST 06/30/2015 19  10 - 35 U/L Final  .  ALT 06/30/2015 23  9 - 46 U/L Final  . Total Protein 06/30/2015 7.2  6.1 - 8.1 g/dL Final  . Albumin 06/30/2015 3.8  3.6 - 5.1 g/dL Final  . Calcium 06/30/2015 8.0* 8.6 - 10.3 mg/dL Final  . GFR, Est African American 06/30/2015 7* >=60 mL/min Final  . GFR, Est Non African American 06/30/2015 6* >=60 mL/min Final   Comment:   The estimated GFR is a calculation valid for adults (>=56 years old) that uses the CKD-EPI algorithm to adjust for age and sex. It is   not to be used for children, pregnant women, hospitalized patients,    patients on dialysis, or with rapidly changing kidney function. According to the NKDEP, eGFR >89 is normal, 60-89 shows mild impairment, 30-59 shows moderate impairment, 15-29 shows severe impairment and <15 is ESRD.     Marland Kitchen HIV 1 RNA Quant 06/30/2015 <20  <20 copies/mL Final                                   DETECTED  . HIV1 RNA Quant, Log 06/30/2015 <1.30  <1.30 Log copies/mL Final   Comment:   This patient had an HIV-1 RNA viral load below the lower Limit of Quantitation  for this assay (20 copies/mL), but above the lower Limit of Detection for the test. Because of the low viral load, we were unable to determine the actual level of the virus in this patient's sample. Repeat testing may be warranted, when clinically indicated.   This test was performed using the COBAS AmpliPrep/COBAS TaqMan HIV-1 test kit version 2.0. ([place].)   . RPR Ser Ql 06/30/2015 NON REAC  NON REAC Final  . CD4 T Cell Abs 06/30/2015 560  400 - 2700 /uL Final  . CD4 % Helper T Cell 06/30/2015 33  33 - 55 % Final   Performed at [facility]    No results found.   Assessment/Plan   ICD-9-CM ICD-10-CM   1. Hypothyroidism, unspecified hypothyroidism type - stable 244.9 E03.9 TSH     T4, Free  2. Colon cancer screening V76.51 Z12.11 Ambulatory referral to Gastroenterology  3. Essential hypertension - stable 401.9 I10   4. Chronic atrial fibrillation (HCC) - rate controlled 427.31 I48.2 carvedilol (COREG) 3.125 MG tablet  5. ESRD on dialysis ([place]) - stable 585.6 N18.6    V45.11 Z99.2   6. Human immunodeficiency virus (HIV) disease ([place]) - stable 042 B20   7. History of MI (myocardial infarction) 73 I25.2    Anteroseptal     Resume coreg at lower dose twice daily  Continue other medications as ordered  Will call with lab results  Prevnar vaccine given today  Follow up with specialists as scheduled  Follow up in 4 mos for routine visit.  Macai Sisneros S. Perlie Gold  [facility] [address] [place], [place] [address] [phone] Cell (Monday-Friday 8 AM - 5 PM) [phone] After 5 PM and follow prompts

## 2015-08-26 DIAGNOSIS — N186 End stage renal disease: Secondary | ICD-10-CM | POA: Diagnosis not present

## 2015-08-26 DIAGNOSIS — D509 Iron deficiency anemia, unspecified: Secondary | ICD-10-CM | POA: Diagnosis not present

## 2015-08-26 DIAGNOSIS — D631 Anemia in chronic kidney disease: Secondary | ICD-10-CM | POA: Diagnosis not present

## 2015-08-26 DIAGNOSIS — N2581 Secondary hyperparathyroidism of renal origin: Secondary | ICD-10-CM | POA: Diagnosis not present

## 2015-08-26 LAB — T4, FREE: FREE T4: 1.02 ng/dL (ref 0.82–1.77)

## 2015-08-26 LAB — TSH: TSH: 8.84 u[IU]/mL — ABNORMAL HIGH (ref 0.450–4.500)

## 2015-08-27 ENCOUNTER — Telehealth: Payer: Self-pay

## 2015-08-27 DIAGNOSIS — E039 Hypothyroidism, unspecified: Secondary | ICD-10-CM

## 2015-08-27 MED ORDER — LEVOTHYROXINE SODIUM 25 MCG PO TABS: 25.0000 ug | ORAL_TABLET | Freq: Every day | ORAL | Status: DC

## 2015-08-27 NOTE — Telephone Encounter (Signed)
Discussed with patient. RX will be sent to [facility], copy of labs mailed with appointment for 6 week recheck

## 2015-08-27 NOTE — Telephone Encounter (Signed)
-----   Message from [place], [place] sent at 08/26/2015  2:30 PM EST ----- Thyroid uncontrolled- resume levothyroxine 35mcg #30 take 1 po daily with 4RF; reck TSH in 6 weeks;

## 2015-08-28 DIAGNOSIS — D509 Iron deficiency anemia, unspecified: Secondary | ICD-10-CM | POA: Diagnosis not present

## 2015-08-28 DIAGNOSIS — D631 Anemia in chronic kidney disease: Secondary | ICD-10-CM | POA: Diagnosis not present

## 2015-08-28 DIAGNOSIS — N186 End stage renal disease: Secondary | ICD-10-CM | POA: Diagnosis not present

## 2015-08-28 DIAGNOSIS — N2581 Secondary hyperparathyroidism of renal origin: Secondary | ICD-10-CM | POA: Diagnosis not present

## 2015-08-30 DIAGNOSIS — M25512 Pain in left shoulder: Secondary | ICD-10-CM | POA: Diagnosis not present

## 2015-08-30 DIAGNOSIS — M7542 Impingement syndrome of left shoulder: Secondary | ICD-10-CM | POA: Diagnosis not present

## 2015-08-31 ENCOUNTER — Telehealth: Payer: Self-pay | Admitting: Nurse Practitioner

## 2015-08-31 DIAGNOSIS — N2581 Secondary hyperparathyroidism of renal origin: Secondary | ICD-10-CM | POA: Diagnosis not present

## 2015-08-31 DIAGNOSIS — D509 Iron deficiency anemia, unspecified: Secondary | ICD-10-CM | POA: Diagnosis not present

## 2015-08-31 DIAGNOSIS — N186 End stage renal disease: Secondary | ICD-10-CM | POA: Diagnosis not present

## 2015-08-31 DIAGNOSIS — D631 Anemia in chronic kidney disease: Secondary | ICD-10-CM | POA: Diagnosis not present

## 2015-08-31 NOTE — Telephone Encounter (Signed)
New message ° ° ° ° °Returning a call to the nurse °

## 2015-09-02 DIAGNOSIS — D509 Iron deficiency anemia, unspecified: Secondary | ICD-10-CM | POA: Diagnosis not present

## 2015-09-02 DIAGNOSIS — N186 End stage renal disease: Secondary | ICD-10-CM | POA: Diagnosis not present

## 2015-09-02 DIAGNOSIS — N2581 Secondary hyperparathyroidism of renal origin: Secondary | ICD-10-CM | POA: Diagnosis not present

## 2015-09-02 DIAGNOSIS — D631 Anemia in chronic kidney disease: Secondary | ICD-10-CM | POA: Diagnosis not present

## 2015-09-07 DIAGNOSIS — D509 Iron deficiency anemia, unspecified: Secondary | ICD-10-CM | POA: Diagnosis not present

## 2015-09-07 DIAGNOSIS — D631 Anemia in chronic kidney disease: Secondary | ICD-10-CM | POA: Diagnosis not present

## 2015-09-07 DIAGNOSIS — N186 End stage renal disease: Secondary | ICD-10-CM | POA: Diagnosis not present

## 2015-09-07 DIAGNOSIS — N2581 Secondary hyperparathyroidism of renal origin: Secondary | ICD-10-CM | POA: Diagnosis not present

## 2015-09-09 DIAGNOSIS — N2581 Secondary hyperparathyroidism of renal origin: Secondary | ICD-10-CM | POA: Diagnosis not present

## 2015-09-09 DIAGNOSIS — N186 End stage renal disease: Secondary | ICD-10-CM | POA: Diagnosis not present

## 2015-09-09 DIAGNOSIS — D509 Iron deficiency anemia, unspecified: Secondary | ICD-10-CM | POA: Diagnosis not present

## 2015-09-09 DIAGNOSIS — D631 Anemia in chronic kidney disease: Secondary | ICD-10-CM | POA: Diagnosis not present

## 2015-09-11 DIAGNOSIS — D631 Anemia in chronic kidney disease: Secondary | ICD-10-CM | POA: Diagnosis not present

## 2015-09-11 DIAGNOSIS — N2581 Secondary hyperparathyroidism of renal origin: Secondary | ICD-10-CM | POA: Diagnosis not present

## 2015-09-11 DIAGNOSIS — D509 Iron deficiency anemia, unspecified: Secondary | ICD-10-CM | POA: Diagnosis not present

## 2015-09-11 DIAGNOSIS — N186 End stage renal disease: Secondary | ICD-10-CM | POA: Diagnosis not present

## 2015-09-11 DIAGNOSIS — Z992 Dependence on renal dialysis: Secondary | ICD-10-CM | POA: Diagnosis not present

## 2015-09-11 DIAGNOSIS — I120 Hypertensive chronic kidney disease with stage 5 chronic kidney disease or end stage renal disease: Secondary | ICD-10-CM | POA: Diagnosis not present

## 2015-09-14 DIAGNOSIS — D631 Anemia in chronic kidney disease: Secondary | ICD-10-CM | POA: Diagnosis not present

## 2015-09-14 DIAGNOSIS — D509 Iron deficiency anemia, unspecified: Secondary | ICD-10-CM | POA: Diagnosis not present

## 2015-09-14 DIAGNOSIS — N186 End stage renal disease: Secondary | ICD-10-CM | POA: Diagnosis not present

## 2015-09-14 DIAGNOSIS — N2581 Secondary hyperparathyroidism of renal origin: Secondary | ICD-10-CM | POA: Diagnosis not present

## 2015-09-15 ENCOUNTER — Ambulatory Visit: Payer: Medicare Other | Admitting: Podiatry

## 2015-09-17 ENCOUNTER — Ambulatory Visit: Payer: Medicare Other | Admitting: Podiatry

## 2015-09-20 ENCOUNTER — Ambulatory Visit: Payer: Medicare Other | Admitting: Internal Medicine

## 2015-09-21 MED FILL — GABAPENTIN 300 MG CAPSULE: 300 | 30 days supply | Qty: 90 | Fill #1

## 2015-09-22 ENCOUNTER — Other Ambulatory Visit: Payer: Medicare Other

## 2015-09-22 ENCOUNTER — Other Ambulatory Visit: Payer: Self-pay | Admitting: Internal Medicine

## 2015-09-22 DIAGNOSIS — B20 Human immunodeficiency virus [HIV] disease: Secondary | ICD-10-CM | POA: Diagnosis not present

## 2015-09-22 DIAGNOSIS — Z21 Asymptomatic human immunodeficiency virus [HIV] infection status: Secondary | ICD-10-CM

## 2015-09-22 DIAGNOSIS — E039 Hypothyroidism, unspecified: Secondary | ICD-10-CM

## 2015-09-22 LAB — LIPID PANEL
CHOL/HDL RATIO: 3.1 ratio (ref <=5.0)
CHOLESTEROL: 194 mg/dL (ref 125–200)
HDL: 62 mg/dL (ref >=40)
LDL CALC: 113 mg/dL (ref <130)
Triglycerides: 94 mg/dL (ref <150)
VLDL: 19 mg/dL (ref <30)

## 2015-09-23 LAB — RPR

## 2015-09-24 LAB — HIV-1 RNA QUANT-NO REFLEX-BLD
HIV 1 RNA Quant: 127 copies/mL — ABNORMAL HIGH (ref <20)
HIV-1 RNA Quant, Log: 2.1 Log copies/mL — ABNORMAL HIGH (ref <1.30)

## 2015-09-24 LAB — T-HELPER CELL (CD4) - (RCID CLINIC ONLY)
CD4 T CELL ABS: 320 /uL — AB (ref 400–2700)
CD4 T CELL HELPER: 28 % — AB (ref 33–55)

## 2015-09-29 ENCOUNTER — Telehealth: Payer: Self-pay | Admitting: *Deleted

## 2015-09-29 NOTE — Telephone Encounter (Signed)
Patient calling for lab results.  He was scheduled for an appointment 1/9, but [facility] was closed due to the weather.  Patient had labs drawn, has a viral load of 128.  He would like to know what the next step is.  He is a dialysis patient, has treatment Tuesday, Thursday, Saturday.  He can come on a dialysis day IF he is seen and done by 9:15. Please advise. Landis Gandy, RN

## 2015-09-30 ENCOUNTER — Ambulatory Visit: Payer: Medicare Other | Admitting: Internal Medicine

## 2015-10-01 NOTE — Telephone Encounter (Signed)
i will call him to explain that it is a viral blip. Ok for now to watch

## 2015-10-06 ENCOUNTER — Other Ambulatory Visit: Payer: Medicare Other

## 2015-10-06 DIAGNOSIS — E039 Hypothyroidism, unspecified: Secondary | ICD-10-CM | POA: Diagnosis not present

## 2015-10-07 LAB — TSH: TSH: 6.61 u[IU]/mL — ABNORMAL HIGH (ref 0.450–4.500)

## 2015-10-08 ENCOUNTER — Telehealth: Payer: Self-pay

## 2015-10-08 DIAGNOSIS — E039 Hypothyroidism, unspecified: Secondary | ICD-10-CM

## 2015-10-08 MED ORDER — LEVOTHYROXINE SODIUM 50 MCG PO TABS: ORAL_TABLET | ORAL | Status: DC

## 2015-10-08 NOTE — Telephone Encounter (Signed)
Spoke with patient about TSH, to call Rx to [facility], return 11/17/15 @ 8:00 for repeat TSH.

## 2015-10-11 DIAGNOSIS — M25512 Pain in left shoulder: Secondary | ICD-10-CM | POA: Diagnosis not present

## 2015-10-11 DIAGNOSIS — M7542 Impingement syndrome of left shoulder: Secondary | ICD-10-CM | POA: Diagnosis not present

## 2015-10-12 DIAGNOSIS — Z992 Dependence on renal dialysis: Secondary | ICD-10-CM | POA: Diagnosis not present

## 2015-10-12 DIAGNOSIS — I120 Hypertensive chronic kidney disease with stage 5 chronic kidney disease or end stage renal disease: Secondary | ICD-10-CM | POA: Diagnosis not present

## 2015-10-12 DIAGNOSIS — N186 End stage renal disease: Secondary | ICD-10-CM | POA: Diagnosis not present

## 2015-10-14 DIAGNOSIS — N2581 Secondary hyperparathyroidism of renal origin: Secondary | ICD-10-CM | POA: Diagnosis not present

## 2015-10-14 DIAGNOSIS — D509 Iron deficiency anemia, unspecified: Secondary | ICD-10-CM | POA: Diagnosis not present

## 2015-10-14 DIAGNOSIS — D631 Anemia in chronic kidney disease: Secondary | ICD-10-CM | POA: Diagnosis not present

## 2015-10-14 DIAGNOSIS — N186 End stage renal disease: Secondary | ICD-10-CM | POA: Diagnosis not present

## 2015-10-29 MED FILL — CARVEDILOL 3.125 MG TABLET: 3.125 | 30 days supply | Qty: 60 | Fill #1

## 2015-11-09 DIAGNOSIS — Z992 Dependence on renal dialysis: Secondary | ICD-10-CM | POA: Diagnosis not present

## 2015-11-09 DIAGNOSIS — I120 Hypertensive chronic kidney disease with stage 5 chronic kidney disease or end stage renal disease: Secondary | ICD-10-CM | POA: Diagnosis not present

## 2015-11-09 DIAGNOSIS — N186 End stage renal disease: Secondary | ICD-10-CM | POA: Diagnosis not present

## 2015-11-11 DIAGNOSIS — D631 Anemia in chronic kidney disease: Secondary | ICD-10-CM | POA: Diagnosis not present

## 2015-11-11 DIAGNOSIS — N2581 Secondary hyperparathyroidism of renal origin: Secondary | ICD-10-CM | POA: Diagnosis not present

## 2015-11-11 DIAGNOSIS — D508 Other iron deficiency anemias: Secondary | ICD-10-CM | POA: Diagnosis not present

## 2015-11-11 DIAGNOSIS — N186 End stage renal disease: Secondary | ICD-10-CM | POA: Diagnosis not present

## 2015-11-11 DIAGNOSIS — D509 Iron deficiency anemia, unspecified: Secondary | ICD-10-CM | POA: Diagnosis not present

## 2015-11-17 ENCOUNTER — Other Ambulatory Visit: Payer: Medicare Other

## 2015-11-17 DIAGNOSIS — E039 Hypothyroidism, unspecified: Secondary | ICD-10-CM

## 2015-11-18 ENCOUNTER — Other Ambulatory Visit: Payer: Self-pay

## 2015-11-18 LAB — TSH: TSH: 6.97 u[IU]/mL — AB (ref 0.450–4.500)

## 2015-11-18 MED ORDER — LEVOTHYROXINE SODIUM 75 MCG PO TABS: 75.0000 ug | ORAL_TABLET | Freq: Every day | ORAL | Status: DC

## 2015-11-22 DIAGNOSIS — M25512 Pain in left shoulder: Secondary | ICD-10-CM | POA: Diagnosis not present

## 2015-11-22 DIAGNOSIS — M7542 Impingement syndrome of left shoulder: Secondary | ICD-10-CM | POA: Diagnosis not present

## 2015-11-24 ENCOUNTER — Ambulatory Visit: Payer: Medicare Other

## 2015-11-25 DIAGNOSIS — M7542 Impingement syndrome of left shoulder: Secondary | ICD-10-CM | POA: Diagnosis not present

## 2015-11-25 DIAGNOSIS — M19012 Primary osteoarthritis, left shoulder: Secondary | ICD-10-CM | POA: Diagnosis not present

## 2015-11-25 DIAGNOSIS — M25512 Pain in left shoulder: Secondary | ICD-10-CM | POA: Diagnosis not present

## 2015-11-29 ENCOUNTER — Ambulatory Visit (INDEPENDENT_AMBULATORY_CARE_PROVIDER_SITE_OTHER): Payer: Medicare Other | Admitting: Internal Medicine

## 2015-11-29 ENCOUNTER — Encounter: Payer: Self-pay | Admitting: Internal Medicine

## 2015-11-29 VITALS — BP 172/104 | HR 78 | Temp 97.9°F | Wt 249.0 lb

## 2015-11-29 DIAGNOSIS — N186 End stage renal disease: Secondary | ICD-10-CM | POA: Diagnosis not present

## 2015-11-29 DIAGNOSIS — B20 Human immunodeficiency virus [HIV] disease: Secondary | ICD-10-CM

## 2015-11-29 DIAGNOSIS — M25519 Pain in unspecified shoulder: Secondary | ICD-10-CM | POA: Diagnosis not present

## 2015-11-29 DIAGNOSIS — I10 Essential (primary) hypertension: Secondary | ICD-10-CM

## 2015-11-29 MED FILL — GABAPENTIN 300 MG CAPSULE: 300 | 30 days supply | Qty: 90 | Fill #2

## 2015-11-29 NOTE — Progress Notes (Signed)
Patient ID: Bryan Kluver., male   DOB: 1951-02-01, 65 y.o.   MRN: [id number]       Patient ID: Bryan Satkowski., male   DOB: July 10, 1951, 65 y.o.   MRN: [id number]  HPI 65yo M with HIV disease, ESRD on HD, Cd 4 count 320/VL127. Has had left shoulder pain that was c/w bursitis, received steroid injection last week where he feels much better. The patient did not take his BP meds yesterday, took them this morning at 7am, denies headache.  Outpatient Encounter Prescriptions as of 11/29/2015  Medication Sig  . allopurinol (ZYLOPRIM) 300 MG tablet TAKE ONE TABLET BY MOUTH ONCE DAILY FOR GOUT  . carvedilol (COREG) 3.125 MG tablet Take 1 tablet (3.125 mg total) by mouth 2 (two) times daily with a meal.  . darunavir-cobicistat (PREZCOBIX) 800-150 MG per tablet Take 1 tablet by mouth daily. Swallow whole. Take with food. Please D/C Prezista and Norvir.  Marland Kitchen gabapentin (NEURONTIN) 300 MG capsule Take 1 capsule (300 mg total) by mouth 3 (three) times daily.  Marland Kitchen ibuprofen (ADVIL,MOTRIN) 800 MG tablet Take 800 mg by mouth every 8 (eight) hours as needed.  . ISENTRESS 400 MG tablet TAKE ONE TABLET BY MOUTH TWICE DAILY  . lamivudine (EPIVIR) 100 MG tablet Take 0.5 tablets (50 mg total) by mouth daily.  Marland Kitchen lanthanum (FOSRENOL) 1000 MG chewable tablet Chew 1,000 mg by mouth 3 (three) times daily with meals.   Marland Kitchen levothyroxine (SYNTHROID, LEVOTHROID) 75 MCG tablet Take 1 tablet (75 mcg total) by mouth daily before breakfast.  . losartan (COZAAR) 50 MG tablet Take 1 tablet by mouth daily.  . zidovudine (RETROVIR) 300 MG tablet TAKE ONE TABLET BY MOUTH EVERY DAY  . [DISCONTINUED] levothyroxine (SYNTHROID, LEVOTHROID) 50 MCG tablet Take one tablet 30 minutes prior to breakfast (Patient not taking: Reported on 11/29/2015)  . [DISCONTINUED] nystatin (MYCOSTATIN) powder Apply topically 2 (two) times daily. To groin (Patient not taking: Reported on 11/29/2015)   No facility-administered encounter medications on file as of  11/29/2015.     Patient Active Problem List   Diagnosis Date Noted  . Lump of skin of lower extremity 07/25/2015  . Hypertensive urgency 12/21/2014  . Hypothyroid 12/21/2014  . Chronic hepatitis C without hepatic coma ([place]) 11/25/2014  . Ventral incisional hernia 10/12/2014  . Thyroid activity decreased 07/29/2014  . Essential hypertension 06/24/2014  . Floaters in visual field 06/24/2014  . Atrial fibrillation, unspecified 06/24/2014  . Other specified abdominal hernia without obstruction or gangrene 06/24/2014  . Esophageal reflux 06/24/2014  . Annual physical exam 06/24/2014  . Gynecomastia 11/12/2013  . Other complications due to renal dialysis device, implant, and graft 01/29/2013  . Colostomy status ([place]) 12/26/2012  . Aftercare following surgery of the circulatory system, [place] 12/18/2012  . End stage renal disease ([place]) 10/30/2012  . Alcohol abuse   . Atrial fibrillation, permanent ([place]) 04/04/2012  . Fever 04/01/2012  . Hyperkalemia 03/29/2012  . Secondary hyperparathyroidism (of renal origin) 03/29/2012  . ESRD (end stage renal disease) ([place]) 02/29/2012  . Anemia of chronic renal failure 02/29/2012  . Normocytic anemia 02/17/2012  . Acute hepatitis C virus infection 10/07/2010  . Chronic gouty arthropathy 10/07/2010  . 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 Human immunodeficiency virus (HIV) disease ([place]) 09/15/2010     Health Maintenance Due  Topic Date Due  . COLONOSCOPY  11/18/2000     Review of Systems  Physical Exam   BP 172/104 mmHg  Pulse 78  Temp(Src) 97.9 F (36.6  C) (Oral)  Wt 249 lb (112.946 kg) Physical Exam  Constitutional: He is oriented to person, place, and time. He appears well-developed and well-nourished. No distress.  HENT:  Mouth/Throat: Oropharynx is clear and moist. No oropharyngeal exudate.  Cardiovascular: Normal rate, regular rhythm and normal heart sounds. Exam reveals no gallop and no friction rub.  No murmur heard.    Pulmonary/Chest: Effort normal and breath sounds normal. No respiratory distress. He has no wheezes.  Abdominal: Soft. Bowel sounds are normal. He exhibits no distension. There is no tenderness.  Lymphadenopathy:  He has no cervical adenopathy.  Neurological: He is alert and oriented to person, place, and time.  Skin: Skin is warm and dry. No rash noted. No erythema.  Psychiatric: He has a normal mood and affect. His behavior is normal.    Lab Results  Component Value Date   CD4TCELL 28* 09/22/2015   Lab Results  Component Value Date   CD4TABS 320* 09/22/2015   CD4TABS 560 06/30/2015   CD4TABS 500 03/31/2015   Lab Results  Component Value Date   HIV1RNAQUANT 127* 09/22/2015   Lab Results  Component Value Date   HEPBSAB NEGATIVE 02/18/2012   No results found for: RPR  CBC Lab Results  Component Value Date   WBC 6.1 06/30/2015   RBC 3.00* 06/30/2015   HGB 10.6* 06/30/2015   HCT 32.0* 06/30/2015   PLT 196 06/30/2015   MCV 106.7* 06/30/2015   MCH 35.3* 06/30/2015   MCHC 33.1 06/30/2015   RDW 16.4* 06/30/2015   LYMPHSABS 1.8 06/30/2015   MONOABS 0.4 06/30/2015   EOSABS 0.1 06/30/2015   BASOSABS 0.0 06/30/2015   BMET Lab Results  Component Value Date   NA 136 06/30/2015   K 3.7 06/30/2015   CL 95* 06/30/2015   CO2 26 06/30/2015   GLUCOSE 107* 06/30/2015   BUN 49* 06/30/2015   CREATININE 8.42* 06/30/2015   CALCIUM 8.0* 06/30/2015   GFRNONAA 6* 06/30/2015   GFRAA 7* 06/30/2015     Assessment and Plan   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 Will repeat his viral laod and cd 4 count today. Will get reflex geno to see if he has new mutations  Bursitis of left shoulder pain = appears improved since last week's steroid injection. Has full range of motion  Hypertension = recommend to keep taking his meds daily. Important to have better BP control

## 2015-11-30 LAB — T-HELPER CELL (CD4) - (RCID CLINIC ONLY)
CD4 % Helper T Cell: 35 % (ref 33–55)
CD4 T CELL ABS: 740 /uL (ref 400–2700)

## 2015-12-01 LAB — HIV-1 RNA ULTRAQUANT REFLEX TO GENTYP+

## 2015-12-10 DIAGNOSIS — M19012 Primary osteoarthritis, left shoulder: Secondary | ICD-10-CM | POA: Diagnosis not present

## 2015-12-10 DIAGNOSIS — M25512 Pain in left shoulder: Secondary | ICD-10-CM | POA: Diagnosis not present

## 2015-12-10 DIAGNOSIS — M7542 Impingement syndrome of left shoulder: Secondary | ICD-10-CM | POA: Diagnosis not present

## 2015-12-10 DIAGNOSIS — N186 End stage renal disease: Secondary | ICD-10-CM | POA: Diagnosis not present

## 2015-12-10 DIAGNOSIS — I120 Hypertensive chronic kidney disease with stage 5 chronic kidney disease or end stage renal disease: Secondary | ICD-10-CM | POA: Diagnosis not present

## 2015-12-10 DIAGNOSIS — Z992 Dependence on renal dialysis: Secondary | ICD-10-CM | POA: Diagnosis not present

## 2015-12-11 DIAGNOSIS — D631 Anemia in chronic kidney disease: Secondary | ICD-10-CM | POA: Diagnosis not present

## 2015-12-11 DIAGNOSIS — N2581 Secondary hyperparathyroidism of renal origin: Secondary | ICD-10-CM | POA: Diagnosis not present

## 2015-12-11 DIAGNOSIS — N186 End stage renal disease: Secondary | ICD-10-CM | POA: Diagnosis not present

## 2015-12-11 DIAGNOSIS — D508 Other iron deficiency anemias: Secondary | ICD-10-CM | POA: Diagnosis not present

## 2015-12-11 DIAGNOSIS — D509 Iron deficiency anemia, unspecified: Secondary | ICD-10-CM | POA: Diagnosis not present

## 2015-12-14 ENCOUNTER — Telehealth: Payer: Self-pay | Admitting: *Deleted

## 2015-12-14 NOTE — Telephone Encounter (Signed)
Patient called for his lab results (given) and to request dental clinic information.  RN left message for Joycelyn Schmid at [facility].  Patient was at dialysis, he is unable to write down [facility] phone number this time. Landis Gandy, RN

## 2016-01-05 ENCOUNTER — Encounter: Payer: Self-pay | Admitting: Internal Medicine

## 2016-01-05 ENCOUNTER — Ambulatory Visit (INDEPENDENT_AMBULATORY_CARE_PROVIDER_SITE_OTHER): Payer: Medicare Other | Admitting: Internal Medicine

## 2016-01-05 VITALS — BP 120/90 | HR 73 | Temp 97.5°F | Resp 20 | Ht 71.0 in | Wt 243.0 lb

## 2016-01-05 DIAGNOSIS — M1A00X0 Idiopathic chronic gout, unspecified site, without tophus (tophi): Secondary | ICD-10-CM

## 2016-01-05 DIAGNOSIS — B20 Human immunodeficiency virus [HIV] disease: Secondary | ICD-10-CM

## 2016-01-05 DIAGNOSIS — E039 Hypothyroidism, unspecified: Secondary | ICD-10-CM

## 2016-01-05 DIAGNOSIS — M7989 Other specified soft tissue disorders: Secondary | ICD-10-CM | POA: Diagnosis not present

## 2016-01-05 DIAGNOSIS — Z992 Dependence on renal dialysis: Secondary | ICD-10-CM | POA: Diagnosis not present

## 2016-01-05 DIAGNOSIS — I10 Essential (primary) hypertension: Secondary | ICD-10-CM | POA: Diagnosis not present

## 2016-01-05 DIAGNOSIS — I482 Chronic atrial fibrillation, unspecified: Secondary | ICD-10-CM

## 2016-01-05 DIAGNOSIS — R1012 Left upper quadrant pain: Secondary | ICD-10-CM | POA: Diagnosis not present

## 2016-01-05 DIAGNOSIS — N186 End stage renal disease: Secondary | ICD-10-CM | POA: Diagnosis not present

## 2016-01-05 MED FILL — HYDROCODON-APAP 5-325: 5-325 | 2 days supply | Qty: 12 | Fill #0

## 2016-01-05 MED FILL — AMOXICILLIN 500 MG CAPSULE: 500 | 5 days supply | Qty: 15 | Fill #0

## 2016-01-05 NOTE — Patient Instructions (Signed)
Continue current medications as ordered  Will call with lab and imaging results  Follow up with specialists as scheduled  Follow up with dialysis as scheduled  Follow up in 4 mos for routine visit

## 2016-01-05 NOTE — Progress Notes (Signed)
Patient ID: Bryan Gilmore., male   DOB: Dec 02, 1950, 65 y.o.   MRN: [id number]    Location:    [facility]   Place of Service:   OFFICE  Chief Complaint  Patient presents with  . Medical Management of Chronic Issues    4 month follow for routine visit, Patient says he has pain in his let side  . OTHER    labs printed    HPI:  65 yo male seen today for f/u. He c/o left flank pain x several days. No known trauma. He also c/o LLQ pain. He had N/V last weekend. No f/c  skin rash - stable on nystatin powder.  He continues to itch.  afib - rate controlled. Currently taking coreg  HTN - stable on losartan. Hydralazine stopped by nephro  CP/hx MI - had nuclear stress test in Dec 2016 that showed fixed defect apical and inferior wall c/w prior infarct; EF 48%.  ESRD/HD - TThSa via right forearm AVF; no issues. Takes fosrenal  Gout - no attacks since his last visit. Takes allopurinol  Joint pain/neuropathy - takes ibuprofen prn and gabapentin  HIV - followed by Dr Baxter Flattery. CD4 >600 about 1 month ago. Viral load undetectable. He is on 4 HAART meds.  Hypothyroidism - no cold intolerance/palpitations. He takes levothyroxine  Past Medical History  Diagnosis Date  . HIV (human immunodeficiency virus infection) ([place])   . Sepsis(995.91)   . Gout   . Fournier's gangrene   . Severe protein-calorie malnutrition ([place])   . Alcohol abuse   . Dysrhythmia     AFib, not a candidate for coumadin , dr [name] signed off on pt  . Hypertension   . Colostomy status ([place])   . Sebaceous cyst   . Cough   . Unspecified constipation   . GERD (gastroesophageal reflux disease)   . Atrial fibrillation ([place])   . Ulcer of lower limb, unspecified   . Ulcer of lower limb, unspecified   . Other severe protein-calorie malnutrition   . Disorders of phosphorus metabolism   . Hypopotassemia   . Altered mental status   . Hemoptysis, unspecified   . Acute respiratory failure ([place])   . Gangrene ([place])   .  Sepsis(995.91)   . Peripheral vascular disease, unspecified ([place])     denies  . Hypothyroidism   . Myocardial infarction [facility])     "told by a dr I had evidence of one that I didn't know about" (10/12/2014)  . Anemia   . Hepatitis C     "I've been treated for all of them" (10/12/2014)  . Hepatitis A   . Hepatitis B   . Gouty arthritis   . Arthritis     "joints" (10/12/2014)  . ESRD (end stage renal disease) on dialysis ([place])     "TTS; [facility] " (10/12/2014)  . End stage renal disease ([place])   . Anemia in chronic kidney disease(285.21)     Past Surgical History  Procedure Laterality Date  . Cystoscopy with urethral dilatation  02/17/2012    Procedure: CYSTOSCOPY WITH URETHRAL DILATATION;  Surgeon: Reece Packer, MD;  Location: [facility];  Service: Urology;  Laterality: N/A;  retrograde urethragram, placement of foley catheter exam under anesthesia   . Insertion of dialysis catheter  02/20/2012    Procedure: INSERTION OF DIALYSIS CATHETER;  Surgeon: Angelia Mould, MD;  Location: [place];  Service: Vascular;  Laterality: N/A;  . Irrigation and debridement abscess  02/29/2012  Procedure: IRRIGATION AND DEBRIDEMENT ABSCESS;  Surgeon: Reece Packer, MD;  Location: [place];  Service: Urology;  Laterality: N/A;  I&D of scrotum abcess  . Insertion of dialysis catheter  03/29/2012    Procedure: INSERTION OF DIALYSIS CATHETER;  Surgeon: Rosetta Posner, MD;  [facility]: [facility] OR;  Service: Vascular;  Laterality: N/A;  Insertion tunneled dialysis catheter right IJ  . Av fistula placement  04/12/2012    Procedure: INSERTION OF ARTERIOVENOUS (AV) GORE-TEX GRAFT ARM;  Surgeon: Rosetta Posner, MD;  Location: [facility] OR;  Service: Vascular;  Laterality: Left;  insertion of 6x50 stretch goretex graft   . Thrombectomy and revision of arterioventous (av) goretex  graft  07/15/2012    Procedure: THROMBECTOMY AND REVISION OF ARTERIOVENTOUS (AV) GORETEX  GRAFT;  Surgeon: Elam Dutch, MD;  Location: [place];   Service: Vascular;  Laterality: Left;  . Urethrogram  07/30/2012    Procedure: URETHROGRAM;  Surgeon: Reece Packer, MD;  [facility];  Service: Urology;  Laterality: N/A;  cystogram  . Cystoscopy  07/30/2012    Procedure: CYSTOSCOPY;  Surgeon: Reece Packer, MD;  [facility];  Service: Urology;  Laterality: Bilateral;  retrograde  . Insertion of dialysis catheter  08/04/2012    Procedure: INSERTION OF DIALYSIS CATHETER;  Surgeon: Angelia Mould, MD;  Location: [place];  Service: Vascular;  Laterality: Right;  right intrajugular hemodialysis catheter insertion  . Av fistula placement  08/14/2012    Procedure: INSERTION OF ARTERIOVENOUS (AV) GORE-TEX GRAFT ARM;  Surgeon: Mal Misty, MD;  Location: [place];  Service: Vascular;  Laterality: Left;  . Colostomy  02/2012    d/t an infection  . Av fistula placement Right 11/08/2012    Procedure: ARTERIOVENOUS (AV) FISTULA CREATION;  Surgeon: Angelia Mould, MD;  Location: [place];  Service: Vascular;  Laterality: Right;  . Surgery for fournier's gangrene  02/2012  . Colostomy  13  . Colostomy closure N/A 02/21/2013    Procedure: COLOSTOMY CLOSURE;  Surgeon: [name], MD;  Location: [place];  Service: General;  Laterality: N/A;  takedown of sigmoid colostomy with anastomosis  . Bowel resection N/A 02/21/2013    Procedure: SMALL BOWEL RESECTION;  Surgeon: [name], MD;  Location: [place];  Service: General;  Laterality: N/A;  . Fistulogram Right 01/06/2013    Procedure: FISTULOGRAM;  Surgeon: Angelia Mould, MD;  Location: [facility] CATH LAB;  Service: Cardiovascular;  Laterality: Right;  . Ventral hernia repair  10/12/2014  . Hernia repair    . Ventral hernia repair N/A 10/12/2014    Procedure: REPAIR VENTRAL INCISIONAL HERNIA;  Surgeon: Excell Seltzer, MD;  Location: [place];  Service: General;  Laterality: N/A;  . Insertion of mesh N/A 10/12/2014    Procedure: INSERTION OF MESH;  Surgeon: Excell Seltzer,  MD;  Location: [place];  Service: General;  Laterality: N/A;    Patient Care Team: Gildardo Cranker, DO as PCP - General (Internal Medicine) Thayer Headings, MD as PCP - Infectious Diseases (Infectious Diseases) Minda Ditto, Rad Tech as Technician Corliss Parish, MD as Consulting Physician (Nephrology)  Social History   Social History  . Marital Status: Married    Spouse Name: N/A  . Number of Children: 0  . Years of Education: N/A   Occupational History  . [place]   Social History Main Topics  . Smoking status: Never Smoker   . Smokeless tobacco: Never Used  . Alcohol  Use: No  . Drug Use: No  . Sexual Activity: No     Comment: declined condoms   Other Topics Concern  . Not on file   Social History Narrative   Single.  Unemployed.  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       reports that he has never smoked. He has never used smokeless tobacco. He reports that he does not drink alcohol or use illicit drugs.  No Known Allergies  Medications: Patient's Medications  New Prescriptions   No medications on file  Previous Medications   ALLOPURINOL (ZYLOPRIM) 300 MG TABLET    TAKE ONE TABLET BY MOUTH ONCE DAILY FOR GOUT   CARVEDILOL (COREG) 3.125 MG TABLET    Take 1 tablet (3.125 mg total) by mouth 2 (two) times daily with a meal.   DARUNAVIR-COBICISTAT (PREZCOBIX) 800-150 MG PER TABLET    Take 1 tablet by mouth daily. Swallow whole. Take with food. Please D/C Prezista and Norvir.   GABAPENTIN (NEURONTIN) 300 MG CAPSULE    Take 1 capsule (300 mg total) by mouth 3 (three) times daily.   IBUPROFEN (ADVIL,MOTRIN) 800 MG TABLET    Take 800 mg by mouth every 8 (eight) hours as needed.   ISENTRESS 400 MG TABLET    TAKE ONE TABLET BY MOUTH TWICE DAILY   LAMIVUDINE (EPIVIR) 100 MG TABLET    Take 0.5 tablets (50 mg total) by mouth daily.   LANTHANUM (FOSRENOL) 1000 MG CHEWABLE TABLET    Chew 1,000 mg by mouth 3 (three) times daily with meals.     LEVOTHYROXINE (SYNTHROID, LEVOTHROID) 75 MCG TABLET    Take 1 tablet (75 mcg total) by mouth daily before breakfast.   LOSARTAN (COZAAR) 50 MG TABLET    Take 1 tablet by mouth daily.   ZIDOVUDINE (RETROVIR) 300 MG TABLET    TAKE ONE TABLET BY MOUTH EVERY DAY  Modified Medications   No medications on file  Discontinued Medications   No medications on file    Review of Systems  Gastrointestinal: Positive for nausea, vomiting and abdominal pain.  Musculoskeletal: Positive for arthralgias.  Skin: Positive for rash.  All other systems reviewed and are negative.   Filed Vitals:   01/05/16 0812  BP: 120/90  Pulse: 73  Temp: 97.5 F (36.4 C)  TempSrc: Oral  Resp: 20  Height: 5\' 11"  (1.803 m)  Weight: 243 lb (110.224 kg)  SpO2: 94%   Body mass index is 33.91 kg/(m^2).  Physical Exam  Constitutional: He is oriented to person, place, and time. He appears well-developed and well-nourished. No distress.  HENT:  Mouth/Throat: Oropharynx is clear and moist. No oropharyngeal exudate.  Eyes: Pupils are equal, round, and reactive to light. No scleral icterus.  Neck: Neck supple. Carotid bruit is not present. No thyromegaly present.  Cardiovascular: Normal rate and intact distal pulses.  An irregularly irregular rhythm present. Exam reveals no gallop and no friction rub.   Murmur heard.  Systolic murmur is present with a grade of 1/6  no distal LE swelling. No calf TTP. Right forearm AVF palpable thrill/audible bruit  Pulmonary/Chest: Effort normal and breath sounds normal. He has no wheezes. He has no rales. He exhibits no tenderness. Right breast exhibits no inverted nipple, no mass, no nipple discharge, no skin change and no tenderness. Left breast exhibits no inverted nipple, no mass, no nipple discharge, no skin change and no tenderness. Breasts are symmetrical.  Abdominal: Soft. Bowel sounds are normal. He  exhibits distension. He exhibits no abdominal bruit, no pulsatile midline mass and  no mass. There is tenderness (LUQ). There is no rebound and no guarding.  Musculoskeletal: He exhibits edema and tenderness.  Lymphadenopathy:    He has no cervical adenopathy.    He has axillary adenopathy (TTP with swelling).       Right axillary: No pectoral and no lateral adenopathy present.       Left axillary: Lateral adenopathy present. No pectoral adenopathy present. Neurological: He is alert and oriented to person, place, and time.  Skin: Skin is warm and dry. Rash noted.  Psychiatric: He has a normal mood and affect. His behavior is normal. Judgment and thought content normal.     Labs reviewed: Office Visit on 11/29/2015  Component Date Value Ref Range Status  . CD4 T Cell Abs 11/29/2015 740  400 - 2700 /uL Final  . CD4 % Helper T Cell 11/29/2015 35  33 - 55 % Final   Performed at [facility]  . HIV 1 RNA Quant 11/29/2015 <20  <20 copies/mL Final   Comment: HIV Genotype cannot be performed due to low viral load.                                 DETECTED   . HIV1 RNA Quant, Log 11/29/2015 <1.30  <1.30 Log copies/mL Final   Comment:   This patient had an HIV-1 RNA viral load below the lower Limit of Quantitation for this assay (20 copies/mL), but above the lower Limit of Detection for the test. Because of the low viral load, we were unable to determine the actual level of the virus in this patient's sample. Repeat testing may be warranted, when clinically indicated.   HIV Genotype will reflex if the HIV Quant is 2000 copies/mL or higher.   This test was performed using the COBAS AmpliPrep/COBAS TaqMan HIV-1 test kit version 2.0. [employer].)   Appointment on 11/17/2015  Component Date Value Ref Range Status  . TSH 11/17/2015 6.970* 0.450 - 4.500 uIU/mL Final    No results found.   Assessment/Plan   ICD-9-CM ICD-10-CM   1. Left axillary swelling 729.81 M79.89 US BREAST LTD UNI LEFT INC AXILLA  2. LUQ abdominal pain 789.02  R10.12 DG Abd Acute W/Chest     CMP  3. Hypothyroidism, unspecified hypothyroidism type 244.9 E03.9 TSH  4. Chronic atrial fibrillation (HCC) 427.31 I48.2   5. Essential hypertension 401.9 I10 CMP  6. ESRD on dialysis ([place]) 585.6 N18.6 CMP   V45.11 Z99.2   7. Human immunodeficiency virus (HIV) disease ([place]) 042 B20 CMP     CBC with Differential  8. Chronic gouty arthropathy 274.02 [id number] Uric Acid    Continue current medications as ordered  Will call with lab and imaging results  Follow up with specialists as scheduled  Follow up with dialysis as scheduled  Follow up in 4 mos for routine visit  Krista Som S. Perlie Gold  [facility] [address] [place], [place] [address] [phone] Cell (Monday-Friday 8 AM - 5 PM) [phone] After 5 PM and follow prompts

## 2016-01-06 ENCOUNTER — Other Ambulatory Visit: Payer: Self-pay | Admitting: Internal Medicine

## 2016-01-06 DIAGNOSIS — N62 Hypertrophy of breast: Secondary | ICD-10-CM

## 2016-01-06 LAB — COMPREHENSIVE METABOLIC PANEL
ALBUMIN: 4.3 g/dL (ref 3.6–4.8)
ALK PHOS: 59 IU/L (ref 39–117)
ALT: 17 IU/L (ref 0–44)
AST: 18 IU/L (ref 0–40)
Albumin/Globulin Ratio: 1.2 (ref 1.2–2.2)
BILIRUBIN TOTAL: 0.3 mg/dL (ref 0.0–1.2)
BUN / CREAT RATIO: 5 — AB (ref 10–24)
BUN: 39 mg/dL — ABNORMAL HIGH (ref 8–27)
CO2: 26 mmol/L (ref 18–29)
CREATININE: 8.5 mg/dL — AB (ref 0.76–1.27)
Calcium: 8.5 mg/dL — ABNORMAL LOW (ref 8.6–10.2)
Chloride: 92 mmol/L — ABNORMAL LOW (ref 96–106)
GFR calc Af Amer: 7 mL/min/{1.73_m2} — ABNORMAL LOW (ref >59)
GFR calc non Af Amer: 6 mL/min/{1.73_m2} — ABNORMAL LOW (ref >59)
GLOBULIN, TOTAL: 3.5 g/dL (ref 1.5–4.5)
Glucose: 89 mg/dL (ref 65–99)
POTASSIUM: 4.1 mmol/L (ref 3.5–5.2)
SODIUM: 142 mmol/L (ref 134–144)
Total Protein: 7.8 g/dL (ref 6.0–8.5)

## 2016-01-06 LAB — CBC WITH DIFFERENTIAL/PLATELET
BASOS ABS: 0 10*3/uL (ref 0.0–0.2)
Basos: 0 %
EOS (ABSOLUTE): 0 10*3/uL (ref 0.0–0.4)
Eos: 1 %
HEMATOCRIT: 38.2 % (ref 37.5–51.0)
Hemoglobin: 12.6 g/dL (ref 12.6–17.7)
IMMATURE GRANULOCYTES: 0 %
Immature Grans (Abs): 0 10*3/uL (ref 0.0–0.1)
LYMPHS ABS: 1.8 10*3/uL (ref 0.7–3.1)
Lymphs: 35 %
MCH: 34.9 pg — ABNORMAL HIGH (ref 26.6–33.0)
MCHC: 33 g/dL (ref 31.5–35.7)
MCV: 106 fL — ABNORMAL HIGH (ref 79–97)
MONOS ABS: 0.4 10*3/uL (ref 0.1–0.9)
Monocytes: 9 %
NEUTROS PCT: 55 %
Neutrophils Absolute: 2.9 10*3/uL (ref 1.4–7.0)
PLATELETS: 195 10*3/uL (ref 150–379)
RBC: 3.61 x10E6/uL — AB (ref 4.14–5.80)
RDW: 14 % (ref 12.3–15.4)
WBC: 5.1 10*3/uL (ref 3.4–10.8)

## 2016-01-06 LAB — URIC ACID: Uric Acid: 3.9 mg/dL (ref 3.7–8.6)

## 2016-01-06 LAB — TSH: TSH: 4.8 u[IU]/mL — ABNORMAL HIGH (ref 0.450–4.500)

## 2016-01-09 DIAGNOSIS — N186 End stage renal disease: Secondary | ICD-10-CM | POA: Diagnosis not present

## 2016-01-09 DIAGNOSIS — I120 Hypertensive chronic kidney disease with stage 5 chronic kidney disease or end stage renal disease: Secondary | ICD-10-CM | POA: Diagnosis not present

## 2016-01-09 DIAGNOSIS — Z992 Dependence on renal dialysis: Secondary | ICD-10-CM | POA: Diagnosis not present

## 2016-01-10 ENCOUNTER — Other Ambulatory Visit: Payer: Self-pay

## 2016-01-10 MED ORDER — LEVOTHYROXINE SODIUM 100 MCG PO TABS: 100.0000 ug | ORAL_TABLET | Freq: Every day | ORAL | Status: DC

## 2016-01-10 MED FILL — LEVOTHYROXINE 100 MCG TAB: 100 | 30 days supply | Qty: 30 | Fill #0

## 2016-01-11 DIAGNOSIS — N186 End stage renal disease: Secondary | ICD-10-CM | POA: Diagnosis not present

## 2016-01-11 DIAGNOSIS — N2581 Secondary hyperparathyroidism of renal origin: Secondary | ICD-10-CM | POA: Diagnosis not present

## 2016-01-11 DIAGNOSIS — D631 Anemia in chronic kidney disease: Secondary | ICD-10-CM | POA: Diagnosis not present

## 2016-01-11 MED FILL — CARVEDILOL 3.125 MG TABLET: 3.125 | 30 days supply | Qty: 60 | Fill #2

## 2016-01-12 DIAGNOSIS — M216X1 Other acquired deformities of right foot: Secondary | ICD-10-CM | POA: Diagnosis not present

## 2016-01-12 DIAGNOSIS — L602 Onychogryphosis: Secondary | ICD-10-CM | POA: Diagnosis not present

## 2016-01-12 DIAGNOSIS — I739 Peripheral vascular disease, unspecified: Secondary | ICD-10-CM | POA: Diagnosis not present

## 2016-01-12 DIAGNOSIS — L84 Corns and callosities: Secondary | ICD-10-CM | POA: Diagnosis not present

## 2016-01-12 DIAGNOSIS — G64 Other disorders of peripheral nervous system: Secondary | ICD-10-CM | POA: Diagnosis not present

## 2016-01-12 DIAGNOSIS — M216X2 Other acquired deformities of left foot: Secondary | ICD-10-CM | POA: Diagnosis not present

## 2016-01-14 ENCOUNTER — Ambulatory Visit
Admission: RE | Admit: 2016-01-14 | Discharge: 2016-01-14 | Disposition: A | Discharge status: Home / Self Care | Payer: Medicare Other | Source: Ambulatory Visit | Attending: Internal Medicine | Admitting: Internal Medicine

## 2016-01-14 DIAGNOSIS — M7989 Other specified soft tissue disorders: Secondary | ICD-10-CM

## 2016-01-14 DIAGNOSIS — N62 Hypertrophy of breast: Secondary | ICD-10-CM

## 2016-01-14 DIAGNOSIS — L602 Onychogryphosis: Secondary | ICD-10-CM | POA: Diagnosis not present

## 2016-01-14 DIAGNOSIS — B351 Tinea unguium: Secondary | ICD-10-CM | POA: Diagnosis not present

## 2016-01-14 DIAGNOSIS — R928 Other abnormal and inconclusive findings on diagnostic imaging of breast: Secondary | ICD-10-CM | POA: Diagnosis not present

## 2016-01-24 ENCOUNTER — Other Ambulatory Visit: Payer: Self-pay | Admitting: Gastroenterology

## 2016-01-24 DIAGNOSIS — K7460 Unspecified cirrhosis of liver: Secondary | ICD-10-CM | POA: Diagnosis not present

## 2016-01-24 DIAGNOSIS — K7469 Other cirrhosis of liver: Secondary | ICD-10-CM

## 2016-01-24 DIAGNOSIS — R1030 Lower abdominal pain, unspecified: Secondary | ICD-10-CM | POA: Diagnosis not present

## 2016-01-28 ENCOUNTER — Ambulatory Visit
Admission: RE | Admit: 2016-01-28 | Discharge: 2016-01-28 | Disposition: A | Discharge status: Home / Self Care | Payer: Medicare Other | Source: Ambulatory Visit | Attending: Gastroenterology | Admitting: Gastroenterology

## 2016-01-28 DIAGNOSIS — K7460 Unspecified cirrhosis of liver: Secondary | ICD-10-CM | POA: Diagnosis not present

## 2016-01-28 DIAGNOSIS — K7469 Other cirrhosis of liver: Secondary | ICD-10-CM

## 2016-01-31 ENCOUNTER — Other Ambulatory Visit: Payer: Self-pay | Admitting: Internal Medicine

## 2016-01-31 DIAGNOSIS — B20 Human immunodeficiency virus [HIV] disease: Secondary | ICD-10-CM

## 2016-02-09 DIAGNOSIS — I120 Hypertensive chronic kidney disease with stage 5 chronic kidney disease or end stage renal disease: Secondary | ICD-10-CM | POA: Diagnosis not present

## 2016-02-09 DIAGNOSIS — Z992 Dependence on renal dialysis: Secondary | ICD-10-CM | POA: Diagnosis not present

## 2016-02-09 DIAGNOSIS — N186 End stage renal disease: Secondary | ICD-10-CM | POA: Diagnosis not present

## 2016-02-10 DIAGNOSIS — D631 Anemia in chronic kidney disease: Secondary | ICD-10-CM | POA: Diagnosis not present

## 2016-02-10 DIAGNOSIS — N186 End stage renal disease: Secondary | ICD-10-CM | POA: Diagnosis not present

## 2016-02-10 DIAGNOSIS — R509 Fever, unspecified: Secondary | ICD-10-CM | POA: Diagnosis not present

## 2016-02-10 DIAGNOSIS — D509 Iron deficiency anemia, unspecified: Secondary | ICD-10-CM | POA: Diagnosis not present

## 2016-02-10 DIAGNOSIS — N2581 Secondary hyperparathyroidism of renal origin: Secondary | ICD-10-CM | POA: Diagnosis not present

## 2016-02-14 MED FILL — HYDROCODON-APAP 5-325: 5-325 | 1 days supply | Qty: 6 | Fill #0

## 2016-02-15 DIAGNOSIS — R509 Fever, unspecified: Secondary | ICD-10-CM | POA: Diagnosis not present

## 2016-02-15 DIAGNOSIS — N186 End stage renal disease: Secondary | ICD-10-CM | POA: Diagnosis not present

## 2016-02-15 DIAGNOSIS — N2581 Secondary hyperparathyroidism of renal origin: Secondary | ICD-10-CM | POA: Diagnosis not present

## 2016-02-15 DIAGNOSIS — D509 Iron deficiency anemia, unspecified: Secondary | ICD-10-CM | POA: Diagnosis not present

## 2016-02-15 DIAGNOSIS — D631 Anemia in chronic kidney disease: Secondary | ICD-10-CM | POA: Diagnosis not present

## 2016-02-17 DIAGNOSIS — D631 Anemia in chronic kidney disease: Secondary | ICD-10-CM | POA: Diagnosis not present

## 2016-02-17 DIAGNOSIS — N2581 Secondary hyperparathyroidism of renal origin: Secondary | ICD-10-CM | POA: Diagnosis not present

## 2016-02-17 DIAGNOSIS — N186 End stage renal disease: Secondary | ICD-10-CM | POA: Diagnosis not present

## 2016-02-17 DIAGNOSIS — D509 Iron deficiency anemia, unspecified: Secondary | ICD-10-CM | POA: Diagnosis not present

## 2016-02-17 DIAGNOSIS — R509 Fever, unspecified: Secondary | ICD-10-CM | POA: Diagnosis not present

## 2016-02-19 DIAGNOSIS — N186 End stage renal disease: Secondary | ICD-10-CM | POA: Diagnosis not present

## 2016-02-19 DIAGNOSIS — N2581 Secondary hyperparathyroidism of renal origin: Secondary | ICD-10-CM | POA: Diagnosis not present

## 2016-02-19 DIAGNOSIS — R509 Fever, unspecified: Secondary | ICD-10-CM | POA: Diagnosis not present

## 2016-02-19 DIAGNOSIS — D631 Anemia in chronic kidney disease: Secondary | ICD-10-CM | POA: Diagnosis not present

## 2016-02-19 DIAGNOSIS — D509 Iron deficiency anemia, unspecified: Secondary | ICD-10-CM | POA: Diagnosis not present

## 2016-02-21 MED FILL — CARVEDILOL 3.125 MG TABLET: 3.125 | 30 days supply | Qty: 60 | Fill #3

## 2016-02-21 MED FILL — LEVOTHYROXINE 100 MCG TAB: 100 | 30 days supply | Qty: 30 | Fill #1

## 2016-02-22 DIAGNOSIS — D631 Anemia in chronic kidney disease: Secondary | ICD-10-CM | POA: Diagnosis not present

## 2016-02-22 DIAGNOSIS — R509 Fever, unspecified: Secondary | ICD-10-CM | POA: Diagnosis not present

## 2016-02-22 DIAGNOSIS — N2581 Secondary hyperparathyroidism of renal origin: Secondary | ICD-10-CM | POA: Diagnosis not present

## 2016-02-22 DIAGNOSIS — D509 Iron deficiency anemia, unspecified: Secondary | ICD-10-CM | POA: Diagnosis not present

## 2016-02-22 DIAGNOSIS — N186 End stage renal disease: Secondary | ICD-10-CM | POA: Diagnosis not present

## 2016-02-23 ENCOUNTER — Ambulatory Visit: Payer: Self-pay | Admitting: Internal Medicine

## 2016-02-23 ENCOUNTER — Other Ambulatory Visit: Payer: Medicare Other

## 2016-02-23 ENCOUNTER — Ambulatory Visit (INDEPENDENT_AMBULATORY_CARE_PROVIDER_SITE_OTHER): Payer: Medicare Other | Admitting: Internal Medicine

## 2016-02-23 ENCOUNTER — Encounter: Payer: Self-pay | Admitting: Internal Medicine

## 2016-02-23 VITALS — Temp 98.5°F | Ht 71.0 in | Wt 237.0 lb

## 2016-02-23 DIAGNOSIS — R634 Abnormal weight loss: Secondary | ICD-10-CM

## 2016-02-23 DIAGNOSIS — E039 Hypothyroidism, unspecified: Secondary | ICD-10-CM

## 2016-02-23 DIAGNOSIS — B182 Chronic viral hepatitis C: Secondary | ICD-10-CM | POA: Diagnosis not present

## 2016-02-23 DIAGNOSIS — B20 Human immunodeficiency virus [HIV] disease: Secondary | ICD-10-CM

## 2016-02-23 MED ORDER — DOLUTEGRAVIR SODIUM 50 MG PO TABS: 50.0000 mg | ORAL_TABLET | Freq: Every day | ORAL | Status: AC

## 2016-02-23 NOTE — Progress Notes (Signed)
Patient ID: Bryan Gilmore., male   DOB: 31-Jul-1951, 65 y.o.   MRN: [id number]       Patient ID: Bryan Gilmore., male   DOB: 1951-01-11, 65 y.o.   MRN: [id number]  HPI  65yo M with HIV disease, ESRD on HD, currently on RLG, ,DRVc, ZDV/3tc. Hx of HCV, treated. Doing well with adherence. Cd 4 count of 740/VL<20 (march 2017). He recently had teeth extraction in anticipation of getting fitted for dentures. He has been limited to a liquid diet for which he has noticed having weight loss with exception to his abdomen. He reports having an appetite, looking forward to eating solid food.  Outpatient Encounter Prescriptions as of 02/23/2016  Medication Sig  . allopurinol (ZYLOPRIM) 300 MG tablet TAKE ONE TABLET BY MOUTH ONCE DAILY FOR GOUT  . carvedilol (COREG) 3.125 MG tablet Take 1 tablet (3.125 mg total) by mouth 2 (two) times daily with a meal.  . gabapentin (NEURONTIN) 300 MG capsule Take 1 capsule (300 mg total) by mouth 3 (three) times daily.  Marland Kitchen ibuprofen (ADVIL,MOTRIN) 800 MG tablet Take 800 mg by mouth every 8 (eight) hours as needed.  . ISENTRESS 400 MG tablet TAKE ONE TABLET BY MOUTH TWICE DAILY  . lamivudine (EPIVIR) 100 MG tablet Take 0.5 tablets (50 mg total) by mouth daily.  Marland Kitchen lanthanum (FOSRENOL) 1000 MG chewable tablet Chew 1,000 mg by mouth 3 (three) times daily with meals.   Marland Kitchen levothyroxine (SYNTHROID, LEVOTHROID) 100 MCG tablet Take 1 tablet (100 mcg total) by mouth daily before breakfast.  . losartan (COZAAR) 50 MG tablet Take 1 tablet by mouth daily.  Marland Kitchen PREZCOBIX 800-150 MG tablet TAKE 1 TABLET BY MOUTH EVERY DAY WITH FOOD  . zidovudine (RETROVIR) 300 MG tablet TAKE ONE TABLET BY MOUTH EVERY DAY  . [DISCONTINUED] levothyroxine (SYNTHROID, LEVOTHROID) 75 MCG tablet Take 1 tablet (75 mcg total) by mouth daily before breakfast.   No facility-administered encounter medications on file as of 02/23/2016.     Patient Active Problem List   Diagnosis Date Noted  . Left axillary swelling  01/05/2016  . Lump of skin of lower extremity 07/25/2015  . Hypertensive urgency 12/21/2014  . Hypothyroid 12/21/2014  . Chronic hepatitis C without hepatic coma ([place]) 11/25/2014  . Ventral incisional hernia 10/12/2014  . Thyroid activity decreased 07/29/2014  . Essential hypertension 06/24/2014  . Floaters in visual field 06/24/2014  . Atrial fibrillation, unspecified 06/24/2014  . Other specified abdominal hernia without obstruction or gangrene 06/24/2014  . Esophageal reflux 06/24/2014  . Annual physical exam 06/24/2014  . Gynecomastia 11/12/2013  . Other complications due to renal dialysis device, implant, and graft 01/29/2013  . Colostomy status ([place]) 12/26/2012  . Aftercare following surgery of the circulatory system, [place] 12/18/2012  . End stage renal disease ([place]) 10/30/2012  . Alcohol abuse   . Atrial fibrillation, permanent ([place]) 04/04/2012  . Fever 04/01/2012  . Hyperkalemia 03/29/2012  . Secondary hyperparathyroidism (of renal origin) 03/29/2012  . ESRD (end stage renal disease) ([place]) 02/29/2012  . Anemia of chronic renal failure 02/29/2012  . Normocytic anemia 02/17/2012  . Acute hepatitis C virus infection 10/07/2010  . Chronic gouty arthropathy 10/07/2010  . 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 Human immunodeficiency virus (HIV) disease ([place]) 09/15/2010     Health Maintenance Due  Topic Date Due  . COLONOSCOPY  11/18/2000     Review of Systems Unintentional weight loss in setting of multiple teeth extraction. Mild teeth pain. Otherwise 10 point ros is  negative. Physical Exam   Temp(Src) 98.5 F (36.9 C) (Oral)  Ht 5\' 11"  (1.803 m)  Wt 237 lb (107.502 kg)  BMI 33.07 kg/m2 Physical Exam  Constitutional: He is oriented to person, place, and time. He appears well-developed and well-nourished. No distress.  HENT: upper gums healing from dental extraction Mouth/Throat: Oropharynx is clear and moist. No oropharyngeal exudate.  Cardiovascular: Normal  rate, regular rhythm and normal heart sounds. Exam reveals no gallop and no friction rub.  No murmur heard.  Pulmonary/Chest: Effort normal and breath sounds normal. No respiratory distress. He has no wheezes.  Abdominal: Soft. Bowel sounds are normal. He exhibits no distension. There is no tenderness. Protuberant abdomen  Lymphadenopathy:  He has no cervical adenopathy.  Neurological: He is alert and oriented to person, place, and time.  Skin: Skin is warm and dry. No rash noted. No erythema.  Psychiatric: He has a normal mood and affect. His behavior is normal.    Lab Results  Component Value Date   CD4TCELL 35 11/29/2015   Lab Results  Component Value Date   CD4TABS 740 11/29/2015   CD4TABS 320* 09/22/2015   CD4TABS 560 06/30/2015   Lab Results  Component Value Date   HIV1RNAQUANT <20 11/29/2015   Lab Results  Component Value Date   HEPBSAB NEGATIVE 02/18/2012   No results found for: RPR  CBC Lab Results  Component Value Date   WBC 5.1 01/05/2016   RBC 3.61* 01/05/2016   HGB 10.6* 06/30/2015   HCT 38.2 01/05/2016   PLT 195 01/05/2016   MCV 106* 01/05/2016   MCH 34.9* 01/05/2016   MCHC 33.0 01/05/2016   RDW 14.0 01/05/2016   LYMPHSABS 1.8 01/05/2016   MONOABS 0.4 06/30/2015   EOSABS 0.0 01/05/2016   BASOSABS 0.0 01/05/2016   BMET Lab Results  Component Value Date   NA 142 01/05/2016   K 4.1 01/05/2016   CL 92* 01/05/2016   CO2 26 01/05/2016   GLUCOSE 89 01/05/2016   BUN 39* 01/05/2016   CREATININE 8.50* 01/05/2016   CALCIUM 8.5* 01/05/2016   GFRNONAA 6* 01/05/2016   GFRAA 7* 01/05/2016     Assessment and Plan  hiv disease = well controlled. Continue on his current regimen  Weight loss = this can be explained by his recent liquid diet in setting of dental extraction. Anticipate that this is transient. Will follow up at next visit  Chronic hepatitis c without hepatic coma = recently had [place] screening ultrasound in May. Will repeat in the next 6-12  months.  Health maintenance = will need to check when he last had CSY, flu shot this Fall

## 2016-02-24 DIAGNOSIS — N2581 Secondary hyperparathyroidism of renal origin: Secondary | ICD-10-CM | POA: Diagnosis not present

## 2016-02-24 DIAGNOSIS — N186 End stage renal disease: Secondary | ICD-10-CM | POA: Diagnosis not present

## 2016-02-24 DIAGNOSIS — R509 Fever, unspecified: Secondary | ICD-10-CM | POA: Diagnosis not present

## 2016-02-24 DIAGNOSIS — D509 Iron deficiency anemia, unspecified: Secondary | ICD-10-CM | POA: Diagnosis not present

## 2016-02-24 DIAGNOSIS — D631 Anemia in chronic kidney disease: Secondary | ICD-10-CM | POA: Diagnosis not present

## 2016-02-24 LAB — TSH: TSH: 0.636 u[IU]/mL (ref 0.450–4.500)

## 2016-02-26 DIAGNOSIS — N2581 Secondary hyperparathyroidism of renal origin: Secondary | ICD-10-CM | POA: Diagnosis not present

## 2016-02-26 DIAGNOSIS — D631 Anemia in chronic kidney disease: Secondary | ICD-10-CM | POA: Diagnosis not present

## 2016-02-26 DIAGNOSIS — N186 End stage renal disease: Secondary | ICD-10-CM | POA: Diagnosis not present

## 2016-02-26 DIAGNOSIS — R509 Fever, unspecified: Secondary | ICD-10-CM | POA: Diagnosis not present

## 2016-02-26 DIAGNOSIS — D509 Iron deficiency anemia, unspecified: Secondary | ICD-10-CM | POA: Diagnosis not present

## 2016-02-29 DIAGNOSIS — N186 End stage renal disease: Secondary | ICD-10-CM | POA: Diagnosis not present

## 2016-02-29 DIAGNOSIS — D631 Anemia in chronic kidney disease: Secondary | ICD-10-CM | POA: Diagnosis not present

## 2016-02-29 DIAGNOSIS — D509 Iron deficiency anemia, unspecified: Secondary | ICD-10-CM | POA: Diagnosis not present

## 2016-02-29 DIAGNOSIS — R509 Fever, unspecified: Secondary | ICD-10-CM | POA: Diagnosis not present

## 2016-02-29 DIAGNOSIS — N2581 Secondary hyperparathyroidism of renal origin: Secondary | ICD-10-CM | POA: Diagnosis not present

## 2016-03-02 DIAGNOSIS — R509 Fever, unspecified: Secondary | ICD-10-CM | POA: Diagnosis not present

## 2016-03-02 DIAGNOSIS — N2581 Secondary hyperparathyroidism of renal origin: Secondary | ICD-10-CM | POA: Diagnosis not present

## 2016-03-02 DIAGNOSIS — D509 Iron deficiency anemia, unspecified: Secondary | ICD-10-CM | POA: Diagnosis not present

## 2016-03-02 DIAGNOSIS — D631 Anemia in chronic kidney disease: Secondary | ICD-10-CM | POA: Diagnosis not present

## 2016-03-02 DIAGNOSIS — N186 End stage renal disease: Secondary | ICD-10-CM | POA: Diagnosis not present

## 2016-03-03 ENCOUNTER — Encounter: Payer: Self-pay | Admitting: Internal Medicine

## 2016-03-03 ENCOUNTER — Ambulatory Visit (INDEPENDENT_AMBULATORY_CARE_PROVIDER_SITE_OTHER): Payer: Medicare Other | Admitting: Internal Medicine

## 2016-03-03 VITALS — BP 162/84 | HR 61 | Temp 97.9°F | Ht 71.0 in | Wt 231.6 lb

## 2016-03-03 DIAGNOSIS — N186 End stage renal disease: Secondary | ICD-10-CM | POA: Diagnosis not present

## 2016-03-03 DIAGNOSIS — B356 Tinea cruris: Secondary | ICD-10-CM | POA: Diagnosis not present

## 2016-03-03 DIAGNOSIS — Z992 Dependence on renal dialysis: Secondary | ICD-10-CM | POA: Diagnosis not present

## 2016-03-03 DIAGNOSIS — G6289 Other specified polyneuropathies: Secondary | ICD-10-CM

## 2016-03-03 MED ORDER — CLOTRIMAZOLE-BETAMETHASONE 1-0.05 % EX CREA: 1.0000 "application " | TOPICAL_CREAM | Freq: Two times a day (BID) | CUTANEOUS | Status: DC

## 2016-03-03 MED ORDER — GABAPENTIN 300 MG PO CAPS: ORAL_CAPSULE | ORAL | Status: DC

## 2016-03-03 MED FILL — GABAPENTIN 300 MG CAPSULE: 300 | 30 days supply | Qty: 120 | Fill #0

## 2016-03-03 NOTE — Progress Notes (Signed)
Patient ID: Bryan Gilmore., male   DOB: [birth year], 65 y.o.   MRN: [id number]    Location:  [facility] Place of Service: OFFICE  Chief Complaint  Patient presents with  . Acute Visit    has rash between legs, itches, started about 3 weeks ago    HPI:  65 yo male seen today for rash x 3 weeks in groin. It is itchy. He noticed some sores appearing but no blisters. No d/c. He has a hx tinea cruris.  Hx ESRD/HD. He had 13 teeth pulled recently. Appetite reduced and does not eat much due to sore mouth. He felt really ill last week but that improved after HD. More fluid removed than usual. He feels very TTP in abdomen. He is followed by ID Dr [name] and GI Dr Paulita Fujita. He is no longer on isentress and takes tivicay instead. Next f/u appt with ID in 2-3 weeks  Past Medical History  Diagnosis Date  . HIV (human immunodeficiency virus infection) ([place])   . Sepsis(995.91)   . Gout   . Fournier's gangrene   . Severe protein-calorie malnutrition ([place])   . Alcohol abuse   . Dysrhythmia     AFib, not a candidate for coumadin , dr [name] signed off on pt  . Hypertension   . Colostomy status ([place])   . Sebaceous cyst   . Cough   . Unspecified constipation   . GERD (gastroesophageal reflux disease)   . Atrial fibrillation ([place])   . Ulcer of lower limb, unspecified   . Ulcer of lower limb, unspecified   . Other severe protein-calorie malnutrition   . Disorders of phosphorus metabolism   . Hypopotassemia   . Altered mental status   . Hemoptysis, unspecified   . Acute respiratory failure ([place])   . Gangrene ([place])   . Sepsis(995.91)   . Peripheral vascular disease, unspecified ([place])     denies  . Hypothyroidism   . Myocardial infarction [facility])     "told by a dr I had evidence of one that I didn't know about" (10/12/2014)  . Anemia   . Hepatitis C     "I've been treated for all of them" (10/12/2014)  . Hepatitis A   . Hepatitis B   . Gouty arthritis   . Arthritis     "joints" (10/12/2014)  . ESRD (end  stage renal disease) on dialysis ([place])     "TTS; [facility] " (10/12/2014)  . End stage renal disease ([place])   . Anemia in chronic kidney disease(285.21)     Past Surgical History  Procedure Laterality Date  . Cystoscopy with urethral dilatation  02/17/2012    Procedure: CYSTOSCOPY WITH URETHRAL DILATATION;  Surgeon: Reece Packer, MD;  Location: [facility];  Service: Urology;  Laterality: N/A;  retrograde urethragram, placement of foley catheter exam under anesthesia   . Insertion of dialysis catheter  02/20/2012    Procedure: INSERTION OF DIALYSIS CATHETER;  Surgeon: Angelia Mould, MD;  Location: [facility] OR;  Service: Vascular;  Laterality: N/A;  . Irrigation and debridement abscess  02/29/2012    Procedure: IRRIGATION AND DEBRIDEMENT ABSCESS;  Surgeon: Reece Packer, MD;  Location: [place];  Service: Urology;  Laterality: N/A;  I&D of scrotum abcess  . Insertion of dialysis catheter  03/29/2012    Procedure: INSERTION OF DIALYSIS CATHETER;  Surgeon: Rosetta Posner, MD;  Location: [facility] OR;  Service: Vascular;  Laterality: N/A;  Insertion tunneled dialysis catheter right IJ  .  Av fistula placement  04/12/2012    Procedure: INSERTION OF ARTERIOVENOUS (AV) GORE-TEX GRAFT ARM;  Surgeon: Rosetta Posner, MD;  Location: [facility] OR;  Service: Vascular;  Laterality: Left;  insertion of 6x50 stretch goretex graft   . Thrombectomy and revision of arterioventous (av) goretex  graft  07/15/2012    Procedure: THROMBECTOMY AND REVISION OF ARTERIOVENTOUS (AV) GORETEX  GRAFT;  Surgeon: Elam Dutch, MD;  Location: [place];  Service: Vascular;  Laterality: Left;  . Urethrogram  07/30/2012    Procedure: URETHROGRAM;  Surgeon: Reece Packer, MD;  Location: [facility];  Service: Urology;  Laterality: N/A;  cystogram  . Cystoscopy  07/30/2012    Procedure: CYSTOSCOPY;  Surgeon: Reece Packer, MD;  Location: [facility];  Service: Urology;  Laterality: Bilateral;  retrograde  . Insertion of dialysis catheter   08/04/2012    Procedure: INSERTION OF DIALYSIS CATHETER;  Surgeon: Angelia Mould, MD;  Location: [place];  Service: Vascular;  Laterality: Right;  right intrajugular hemodialysis catheter insertion  . Av fistula placement  08/14/2012    Procedure: INSERTION OF ARTERIOVENOUS (AV) GORE-TEX GRAFT ARM;  Surgeon: Mal Misty, MD;  Location: [place];  Service: Vascular;  Laterality: Left;  . Colostomy  02/2012    d/t an infection  . Av fistula placement Right 11/08/2012    Procedure: ARTERIOVENOUS (AV) FISTULA CREATION;  Surgeon: Angelia Mould, MD;  Location: [place];  Service: Vascular;  Laterality: Right;  . Surgery for fournier's gangrene  02/2012  . Colostomy  13  . Colostomy closure N/A 02/21/2013    Procedure: COLOSTOMY CLOSURE;  Surgeon: [name], MD;  Location: [place];  Service: General;  Laterality: N/A;  takedown of sigmoid colostomy with anastomosis  . Bowel resection N/A 02/21/2013    Procedure: SMALL BOWEL RESECTION;  Surgeon: [name], MD;  Location: [place];  Service: General;  Laterality: N/A;  . Fistulogram Right 01/06/2013    Procedure: FISTULOGRAM;  Surgeon: Angelia Mould, MD;  Location: [facility] CATH LAB;  Service: Cardiovascular;  Laterality: Right;  . Ventral hernia repair  10/12/2014  . Hernia repair    . Ventral hernia repair N/A 10/12/2014    Procedure: REPAIR VENTRAL INCISIONAL HERNIA;  Surgeon: Excell Seltzer, MD;  Location: [place];  Service: General;  Laterality: N/A;  . Insertion of mesh N/A 10/12/2014    Procedure: INSERTION OF MESH;  Surgeon: Excell Seltzer, MD;  Location: [place];  Service: General;  Laterality: N/A;    Patient Care Team: Gildardo Cranker, DO as PCP - General (Internal Medicine) Thayer Headings, MD as PCP - Infectious Diseases (Infectious Diseases) Minda Ditto, Rad Tech as Technician Corliss Parish, MD as Consulting Physician (Nephrology)  Social History   Social History  . Marital Status: Married    Spouse  Name: N/A  . Number of Children: 0  . Years of Education: N/A   Occupational History  . [employer]   Social History Main Topics  . Smoking status: Never Smoker   . Smokeless tobacco: Never Used  . Alcohol Use: No  . Drug Use: No  . Sexual Activity: No     Comment: declined condoms   Other Topics Concern  . Not on file   Social History Narrative   Single.  Unemployed.  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       reports that he has never smoked. He has never used smokeless  tobacco. He reports that he does not drink alcohol or use illicit drugs.  Family History  Problem Relation Age of Onset  . Hypertension Mother   . Hypertension Father   . Cancer Cousin     breast   Family Status  Relation Status Death Age  . Mother Deceased     Cause of Death: Gunshot wound  . Father Deceased     Cause of Death: Natural causes  . Sister Alive   . Brother Alive   . Daughter Alive   . Sister Alive   . Sister Alive   . Sister Alive   . Sister Alive   . Daughter Alive   . Cousin Deceased      No Known Allergies  Medications: Patient's Medications  New Prescriptions   No medications on file  Previous Medications   ALLOPURINOL (ZYLOPRIM) 300 MG TABLET    TAKE ONE TABLET BY MOUTH ONCE DAILY FOR GOUT   CARVEDILOL (COREG) 3.125 MG TABLET    Take 1 tablet (3.125 mg total) by mouth 2 (two) times daily with a meal.   DOLUTEGRAVIR (TIVICAY) 50 MG TABLET    Take 1 tablet (50 mg total) by mouth daily.   GABAPENTIN (NEURONTIN) 300 MG CAPSULE    Take 1 capsule (300 mg total) by mouth 3 (three) times daily.   IBUPROFEN (ADVIL,MOTRIN) 800 MG TABLET    Take 800 mg by mouth every 8 (eight) hours as needed.   LAMIVUDINE (EPIVIR) 100 MG TABLET    Take 0.5 tablets (50 mg total) by mouth daily.   LANTHANUM (FOSRENOL) 1000 MG CHEWABLE TABLET    Chew 1,000 mg by mouth 3 (three) times daily with meals.    LEVOTHYROXINE (SYNTHROID, LEVOTHROID) 100 MCG TABLET    Take 1  tablet (100 mcg total) by mouth daily before breakfast.   LOSARTAN (COZAAR) 50 MG TABLET    Take 1 tablet by mouth daily.   PREZCOBIX 800-150 MG TABLET    TAKE 1 TABLET BY MOUTH EVERY DAY WITH FOOD   ZIDOVUDINE (RETROVIR) 300 MG TABLET    TAKE ONE TABLET BY MOUTH EVERY DAY  Modified Medications   No medications on file  Discontinued Medications   No medications on file    Review of Systems  Gastrointestinal: Positive for abdominal distention.  Musculoskeletal: Positive for arthralgias.  Skin: Positive for rash.  Neurological: Positive for numbness.    Filed Vitals:   03/03/16 1355  BP: 162/84  Pulse: 61  Temp: 97.9 F (36.6 C)  TempSrc: Oral  Height: 5\' 11"  (1.803 m)  Weight: 231 lb 9.6 oz (105.053 kg)  SpO2: 94%   Body mass index is 32.32 kg/(m^2).  Physical Exam  Constitutional: He is oriented to person, place, and time. He appears well-developed and well-nourished.  Abdominal: He exhibits distension.  Musculoskeletal: He exhibits edema and tenderness.  Neurological: He is alert and oriented to person, place, and time.  Skin: Skin is warm and dry. Rash (angry appearing red rash with no ulceration or d/c. no satellite lesions) noted.  Psychiatric: He has a normal mood and affect. His behavior is normal.     Labs reviewed: Lab on 02/23/2016  Component Date Value Ref Range Status  . TSH 02/23/2016 0.636  0.450 - 4.500 uIU/mL Final  Office Visit on 01/05/2016  Component Date Value Ref Range Status  . TSH 01/05/2016 4.800* 0.450 - 4.500 uIU/mL Final  . Glucose 01/05/2016 89  65 - 99 mg/dL Final  . BUN 01/05/2016  39* 8 - 27 mg/dL Final  . Creatinine, Ser 01/05/2016 8.50* 0.76 - 1.27 mg/dL Final  . GFR calc non Af Amer 01/05/2016 6* >59 mL/min/1.73 Final  . GFR calc Af Amer 01/05/2016 7* >59 mL/min/1.73 Final  . BUN/Creatinine Ratio 01/05/2016 5* 10 - 24 Final  . Sodium 01/05/2016 142  134 - 144 mmol/L Final  . Potassium 01/05/2016 4.1  3.5 - 5.2 mmol/L Final  .  Chloride 01/05/2016 92* 96 - 106 mmol/L Final  . CO2 01/05/2016 26  18 - 29 mmol/L Final  . Calcium 01/05/2016 8.5* 8.6 - 10.2 mg/dL Final  . Total Protein 01/05/2016 7.8  6.0 - 8.5 g/dL Final  . Albumin 01/05/2016 4.3  3.6 - 4.8 g/dL Final  . Globulin, Total 01/05/2016 3.5  1.5 - 4.5 g/dL Final  . Albumin/Globulin Ratio 01/05/2016 1.2  1.2 - 2.2 Final  . Bilirubin Total 01/05/2016 0.3  0.0 - 1.2 mg/dL Final  . Alkaline Phosphatase 01/05/2016 59  39 - 117 IU/L Final  . AST 01/05/2016 18  0 - 40 IU/L Final  . ALT 01/05/2016 17  0 - 44 IU/L Final  . WBC 01/05/2016 5.1  3.4 - 10.8 x10E3/uL Final  . RBC 01/05/2016 3.61* 4.14 - 5.80 x10E6/uL Final  . Hemoglobin 01/05/2016 12.6  12.6 - 17.7 g/dL Final  . Hematocrit 01/05/2016 38.2  37.5 - 51.0 % Final  . MCV 01/05/2016 106* 79 - 97 fL Final  . MCH 01/05/2016 34.9* 26.6 - 33.0 pg Final  . MCHC 01/05/2016 33.0  31.5 - 35.7 g/dL Final  . RDW 01/05/2016 14.0  12.3 - 15.4 % Final  . Platelets 01/05/2016 195  150 - 379 x10E3/uL Final  . Neutrophils 01/05/2016 55   Final  . Lymphs 01/05/2016 35   Final  . Monocytes 01/05/2016 9   Final  . Eos 01/05/2016 1   Final  . Basos 01/05/2016 0   Final  . Neutrophils Absolute 01/05/2016 2.9  1.4 - 7.0 x10E3/uL Final  . Lymphocytes Absolute 01/05/2016 1.8  0.7 - 3.1 x10E3/uL Final  . Monocytes Absolute 01/05/2016 0.4  0.1 - 0.9 x10E3/uL Final  . EOS (ABSOLUTE) 01/05/2016 0.0  0.0 - 0.4 x10E3/uL Final  . Basophils Absolute 01/05/2016 0.0  0.0 - 0.2 x10E3/uL Final  . Immature Granulocytes 01/05/2016 0   Final  . Immature Grans (Abs) 01/05/2016 0.0  0.0 - 0.1 x10E3/uL Final  . Uric Acid 01/05/2016 3.9  3.7 - 8.6 mg/dL Final              Therapeutic target for gout patients: <6.0    No results found.   Assessment/Plan   ICD-9-CM ICD-10-CM   1. Tinea cruris - recurrent 110.3 B35.6 clotrimazole-betamethasone (LOTRISONE) cream  2. ESRD on dialysis ([place])  -stable 585.6 N18.6    V45.11 Z99.2   3. Other  polyneuropathy ([place]) - uncontrolled   G62.89 gabapentin (NEURONTIN) 300 MG capsule   Increase gabapentin 1 tab in morning and afternoon, 2 tabs in evening  Continue other medications as ordered  Follow up with specialists as scheduled  Follow up in August as scheduled.   Seleta Hovland S. Perlie Gold  [facility] [address] [place], [place] [address] [phone] Cell (Monday-Friday 8 AM - 5 PM) [phone] After 5 PM and follow prompts

## 2016-03-03 NOTE — Patient Instructions (Addendum)
Increase gabapentin 1 tab in morning and afternoon, 2 tabs in evening  Continue other medications as ordered  Follow up with specialists as scheduled  Follow up in August as scheduled.

## 2016-03-04 DIAGNOSIS — N186 End stage renal disease: Secondary | ICD-10-CM | POA: Diagnosis not present

## 2016-03-04 DIAGNOSIS — D631 Anemia in chronic kidney disease: Secondary | ICD-10-CM | POA: Diagnosis not present

## 2016-03-04 DIAGNOSIS — R509 Fever, unspecified: Secondary | ICD-10-CM | POA: Diagnosis not present

## 2016-03-04 DIAGNOSIS — N2581 Secondary hyperparathyroidism of renal origin: Secondary | ICD-10-CM | POA: Diagnosis not present

## 2016-03-04 DIAGNOSIS — D509 Iron deficiency anemia, unspecified: Secondary | ICD-10-CM | POA: Diagnosis not present

## 2016-03-07 DIAGNOSIS — D631 Anemia in chronic kidney disease: Secondary | ICD-10-CM | POA: Diagnosis not present

## 2016-03-07 DIAGNOSIS — R509 Fever, unspecified: Secondary | ICD-10-CM | POA: Diagnosis not present

## 2016-03-07 DIAGNOSIS — N186 End stage renal disease: Secondary | ICD-10-CM | POA: Diagnosis not present

## 2016-03-07 DIAGNOSIS — N2581 Secondary hyperparathyroidism of renal origin: Secondary | ICD-10-CM | POA: Diagnosis not present

## 2016-03-07 DIAGNOSIS — D509 Iron deficiency anemia, unspecified: Secondary | ICD-10-CM | POA: Diagnosis not present

## 2016-03-09 DIAGNOSIS — D631 Anemia in chronic kidney disease: Secondary | ICD-10-CM | POA: Diagnosis not present

## 2016-03-09 DIAGNOSIS — D509 Iron deficiency anemia, unspecified: Secondary | ICD-10-CM | POA: Diagnosis not present

## 2016-03-09 DIAGNOSIS — N186 End stage renal disease: Secondary | ICD-10-CM | POA: Diagnosis not present

## 2016-03-09 DIAGNOSIS — N2581 Secondary hyperparathyroidism of renal origin: Secondary | ICD-10-CM | POA: Diagnosis not present

## 2016-03-09 DIAGNOSIS — R509 Fever, unspecified: Secondary | ICD-10-CM | POA: Diagnosis not present

## 2016-03-10 DIAGNOSIS — I120 Hypertensive chronic kidney disease with stage 5 chronic kidney disease or end stage renal disease: Secondary | ICD-10-CM | POA: Diagnosis not present

## 2016-03-10 DIAGNOSIS — Z992 Dependence on renal dialysis: Secondary | ICD-10-CM | POA: Diagnosis not present

## 2016-03-10 DIAGNOSIS — N186 End stage renal disease: Secondary | ICD-10-CM | POA: Diagnosis not present

## 2016-03-11 DIAGNOSIS — N2581 Secondary hyperparathyroidism of renal origin: Secondary | ICD-10-CM | POA: Diagnosis not present

## 2016-03-11 DIAGNOSIS — D631 Anemia in chronic kidney disease: Secondary | ICD-10-CM | POA: Diagnosis not present

## 2016-03-11 DIAGNOSIS — N186 End stage renal disease: Secondary | ICD-10-CM | POA: Diagnosis not present

## 2016-03-11 DIAGNOSIS — D509 Iron deficiency anemia, unspecified: Secondary | ICD-10-CM | POA: Diagnosis not present

## 2016-03-15 DIAGNOSIS — L84 Corns and callosities: Secondary | ICD-10-CM | POA: Diagnosis not present

## 2016-03-15 DIAGNOSIS — N186 End stage renal disease: Secondary | ICD-10-CM | POA: Diagnosis not present

## 2016-03-15 DIAGNOSIS — L602 Onychogryphosis: Secondary | ICD-10-CM | POA: Diagnosis not present

## 2016-03-16 DIAGNOSIS — N186 End stage renal disease: Secondary | ICD-10-CM | POA: Diagnosis not present

## 2016-03-16 DIAGNOSIS — N2581 Secondary hyperparathyroidism of renal origin: Secondary | ICD-10-CM | POA: Diagnosis not present

## 2016-03-16 DIAGNOSIS — D631 Anemia in chronic kidney disease: Secondary | ICD-10-CM | POA: Diagnosis not present

## 2016-03-16 DIAGNOSIS — D509 Iron deficiency anemia, unspecified: Secondary | ICD-10-CM | POA: Diagnosis not present

## 2016-03-18 DIAGNOSIS — D631 Anemia in chronic kidney disease: Secondary | ICD-10-CM | POA: Diagnosis not present

## 2016-03-18 DIAGNOSIS — D509 Iron deficiency anemia, unspecified: Secondary | ICD-10-CM | POA: Diagnosis not present

## 2016-03-18 DIAGNOSIS — N186 End stage renal disease: Secondary | ICD-10-CM | POA: Diagnosis not present

## 2016-03-18 DIAGNOSIS — N2581 Secondary hyperparathyroidism of renal origin: Secondary | ICD-10-CM | POA: Diagnosis not present

## 2016-03-21 DIAGNOSIS — N2581 Secondary hyperparathyroidism of renal origin: Secondary | ICD-10-CM | POA: Diagnosis not present

## 2016-03-21 DIAGNOSIS — N186 End stage renal disease: Secondary | ICD-10-CM | POA: Diagnosis not present

## 2016-03-21 DIAGNOSIS — D631 Anemia in chronic kidney disease: Secondary | ICD-10-CM | POA: Diagnosis not present

## 2016-03-21 DIAGNOSIS — D509 Iron deficiency anemia, unspecified: Secondary | ICD-10-CM | POA: Diagnosis not present

## 2016-03-23 ENCOUNTER — Other Ambulatory Visit: Payer: Self-pay | Admitting: Internal Medicine

## 2016-03-23 DIAGNOSIS — D631 Anemia in chronic kidney disease: Secondary | ICD-10-CM | POA: Diagnosis not present

## 2016-03-23 DIAGNOSIS — D509 Iron deficiency anemia, unspecified: Secondary | ICD-10-CM | POA: Diagnosis not present

## 2016-03-23 DIAGNOSIS — N186 End stage renal disease: Secondary | ICD-10-CM | POA: Diagnosis not present

## 2016-03-23 DIAGNOSIS — N2581 Secondary hyperparathyroidism of renal origin: Secondary | ICD-10-CM | POA: Diagnosis not present

## 2016-03-24 ENCOUNTER — Other Ambulatory Visit: Payer: Self-pay | Admitting: Internal Medicine

## 2016-03-28 DIAGNOSIS — N2581 Secondary hyperparathyroidism of renal origin: Secondary | ICD-10-CM | POA: Diagnosis not present

## 2016-03-28 DIAGNOSIS — D631 Anemia in chronic kidney disease: Secondary | ICD-10-CM | POA: Diagnosis not present

## 2016-03-28 DIAGNOSIS — N186 End stage renal disease: Secondary | ICD-10-CM | POA: Diagnosis not present

## 2016-03-28 DIAGNOSIS — D509 Iron deficiency anemia, unspecified: Secondary | ICD-10-CM | POA: Diagnosis not present

## 2016-03-29 DIAGNOSIS — N186 End stage renal disease: Secondary | ICD-10-CM | POA: Diagnosis not present

## 2016-03-29 DIAGNOSIS — Z992 Dependence on renal dialysis: Secondary | ICD-10-CM | POA: Diagnosis not present

## 2016-03-29 DIAGNOSIS — I871 Compression of vein: Secondary | ICD-10-CM | POA: Diagnosis not present

## 2016-03-29 DIAGNOSIS — T82858D Stenosis of vascular prosthetic devices, implants and grafts, subsequent encounter: Secondary | ICD-10-CM | POA: Diagnosis not present

## 2016-03-30 DIAGNOSIS — N2581 Secondary hyperparathyroidism of renal origin: Secondary | ICD-10-CM | POA: Diagnosis not present

## 2016-03-30 DIAGNOSIS — N186 End stage renal disease: Secondary | ICD-10-CM | POA: Diagnosis not present

## 2016-03-30 DIAGNOSIS — D631 Anemia in chronic kidney disease: Secondary | ICD-10-CM | POA: Diagnosis not present

## 2016-03-30 DIAGNOSIS — D509 Iron deficiency anemia, unspecified: Secondary | ICD-10-CM | POA: Diagnosis not present

## 2016-04-01 DIAGNOSIS — D631 Anemia in chronic kidney disease: Secondary | ICD-10-CM | POA: Diagnosis not present

## 2016-04-01 DIAGNOSIS — N186 End stage renal disease: Secondary | ICD-10-CM | POA: Diagnosis not present

## 2016-04-01 DIAGNOSIS — N2581 Secondary hyperparathyroidism of renal origin: Secondary | ICD-10-CM | POA: Diagnosis not present

## 2016-04-01 DIAGNOSIS — D509 Iron deficiency anemia, unspecified: Secondary | ICD-10-CM | POA: Diagnosis not present

## 2016-04-04 ENCOUNTER — Ambulatory Visit: Payer: Medicare Other | Admitting: Internal Medicine

## 2016-04-04 DIAGNOSIS — N186 End stage renal disease: Secondary | ICD-10-CM | POA: Diagnosis not present

## 2016-04-04 DIAGNOSIS — D631 Anemia in chronic kidney disease: Secondary | ICD-10-CM | POA: Diagnosis not present

## 2016-04-04 DIAGNOSIS — N2581 Secondary hyperparathyroidism of renal origin: Secondary | ICD-10-CM | POA: Diagnosis not present

## 2016-04-04 DIAGNOSIS — D509 Iron deficiency anemia, unspecified: Secondary | ICD-10-CM | POA: Diagnosis not present

## 2016-04-05 MED FILL — LEVOTHYROXINE 100 MCG TAB: 100 | 30 days supply | Qty: 30 | Fill #2

## 2016-04-06 DIAGNOSIS — D631 Anemia in chronic kidney disease: Secondary | ICD-10-CM | POA: Diagnosis not present

## 2016-04-06 DIAGNOSIS — N186 End stage renal disease: Secondary | ICD-10-CM | POA: Diagnosis not present

## 2016-04-06 DIAGNOSIS — D509 Iron deficiency anemia, unspecified: Secondary | ICD-10-CM | POA: Diagnosis not present

## 2016-04-06 DIAGNOSIS — N2581 Secondary hyperparathyroidism of renal origin: Secondary | ICD-10-CM | POA: Diagnosis not present

## 2016-04-08 DIAGNOSIS — N186 End stage renal disease: Secondary | ICD-10-CM | POA: Diagnosis not present

## 2016-04-08 DIAGNOSIS — N2581 Secondary hyperparathyroidism of renal origin: Secondary | ICD-10-CM | POA: Diagnosis not present

## 2016-04-08 DIAGNOSIS — D509 Iron deficiency anemia, unspecified: Secondary | ICD-10-CM | POA: Diagnosis not present

## 2016-04-08 DIAGNOSIS — D631 Anemia in chronic kidney disease: Secondary | ICD-10-CM | POA: Diagnosis not present

## 2016-04-10 DIAGNOSIS — I120 Hypertensive chronic kidney disease with stage 5 chronic kidney disease or end stage renal disease: Secondary | ICD-10-CM | POA: Diagnosis not present

## 2016-04-10 DIAGNOSIS — N186 End stage renal disease: Secondary | ICD-10-CM | POA: Diagnosis not present

## 2016-04-10 DIAGNOSIS — Z992 Dependence on renal dialysis: Secondary | ICD-10-CM | POA: Diagnosis not present

## 2016-04-11 DIAGNOSIS — D509 Iron deficiency anemia, unspecified: Secondary | ICD-10-CM | POA: Diagnosis not present

## 2016-04-11 DIAGNOSIS — N2581 Secondary hyperparathyroidism of renal origin: Secondary | ICD-10-CM | POA: Diagnosis not present

## 2016-04-11 DIAGNOSIS — N186 End stage renal disease: Secondary | ICD-10-CM | POA: Diagnosis not present

## 2016-04-11 DIAGNOSIS — D631 Anemia in chronic kidney disease: Secondary | ICD-10-CM | POA: Diagnosis not present

## 2016-04-17 ENCOUNTER — Other Ambulatory Visit: Payer: Medicare Other

## 2016-04-17 DIAGNOSIS — Z21 Asymptomatic human immunodeficiency virus [HIV] infection status: Secondary | ICD-10-CM

## 2016-04-18 LAB — HIV-1 RNA QUANT-NO REFLEX-BLD
HIV 1 RNA Quant: 77 copies/mL — ABNORMAL HIGH (ref <20)
HIV-1 RNA QUANT, LOG: 1.89 {Log_copies}/mL — AB (ref <1.30)

## 2016-04-18 LAB — T-HELPER CELL (CD4) - (RCID CLINIC ONLY)
CD4 T CELL ABS: 730 /uL (ref 400–2700)
CD4 T CELL HELPER: 34 % (ref 33–55)

## 2016-04-28 DIAGNOSIS — N5201 Erectile dysfunction due to arterial insufficiency: Secondary | ICD-10-CM | POA: Diagnosis not present

## 2016-04-28 DIAGNOSIS — N390 Urinary tract infection, site not specified: Secondary | ICD-10-CM | POA: Diagnosis not present

## 2016-05-03 DIAGNOSIS — Z992 Dependence on renal dialysis: Secondary | ICD-10-CM | POA: Diagnosis not present

## 2016-05-03 DIAGNOSIS — I871 Compression of vein: Secondary | ICD-10-CM | POA: Diagnosis not present

## 2016-05-03 DIAGNOSIS — N186 End stage renal disease: Secondary | ICD-10-CM | POA: Diagnosis not present

## 2016-05-03 DIAGNOSIS — T82858D Stenosis of vascular prosthetic devices, implants and grafts, subsequent encounter: Secondary | ICD-10-CM | POA: Diagnosis not present

## 2016-05-08 ENCOUNTER — Other Ambulatory Visit: Payer: Self-pay | Admitting: Internal Medicine

## 2016-05-08 MED FILL — LEVOTHYROXINE 100 MCG TAB: 100 | 30 days supply | Qty: 30 | Fill #0

## 2016-05-10 ENCOUNTER — Ambulatory Visit (INDEPENDENT_AMBULATORY_CARE_PROVIDER_SITE_OTHER): Payer: Medicare Other | Admitting: Internal Medicine

## 2016-05-10 ENCOUNTER — Encounter: Payer: Self-pay | Admitting: Internal Medicine

## 2016-05-10 VITALS — BP 132/84 | HR 70 | Temp 97.6°F | Ht 71.0 in | Wt 239.4 lb

## 2016-05-10 DIAGNOSIS — Z992 Dependence on renal dialysis: Secondary | ICD-10-CM | POA: Diagnosis not present

## 2016-05-10 DIAGNOSIS — N186 End stage renal disease: Secondary | ICD-10-CM

## 2016-05-10 DIAGNOSIS — G629 Polyneuropathy, unspecified: Secondary | ICD-10-CM | POA: Insufficient documentation

## 2016-05-10 DIAGNOSIS — D631 Anemia in chronic kidney disease: Secondary | ICD-10-CM | POA: Diagnosis not present

## 2016-05-10 DIAGNOSIS — H9193 Unspecified hearing loss, bilateral: Secondary | ICD-10-CM | POA: Diagnosis not present

## 2016-05-10 DIAGNOSIS — I482 Chronic atrial fibrillation, unspecified: Secondary | ICD-10-CM | POA: Insufficient documentation

## 2016-05-10 DIAGNOSIS — I10 Essential (primary) hypertension: Secondary | ICD-10-CM | POA: Diagnosis not present

## 2016-05-10 DIAGNOSIS — R1010 Upper abdominal pain, unspecified: Secondary | ICD-10-CM

## 2016-05-10 DIAGNOSIS — E039 Hypothyroidism, unspecified: Secondary | ICD-10-CM

## 2016-05-10 DIAGNOSIS — Z23 Encounter for immunization: Secondary | ICD-10-CM

## 2016-05-10 DIAGNOSIS — N184 Chronic kidney disease, stage 4 (severe): Secondary | ICD-10-CM

## 2016-05-10 LAB — COMPLETE METABOLIC PANEL WITH GFR
ALBUMIN: 3.7 g/dL (ref 3.6–5.1)
ALK PHOS: 49 U/L (ref 40–115)
ALT: 11 U/L (ref 9–46)
AST: 10 U/L (ref 10–35)
BILIRUBIN TOTAL: 0.4 mg/dL (ref 0.2–1.2)
BUN: 36 mg/dL — ABNORMAL HIGH (ref 7–25)
CO2: 27 mmol/L (ref 20–31)
Calcium: 9.6 mg/dL (ref 8.6–10.3)
Chloride: 98 mmol/L (ref 98–110)
Creat: 7.28 mg/dL — ABNORMAL HIGH (ref 0.70–1.25)
GFR, EST AFRICAN AMERICAN: 8 mL/min — AB (ref >=60)
GFR, EST NON AFRICAN AMERICAN: 7 mL/min — AB (ref >=60)
Glucose, Bld: 92 mg/dL (ref 65–99)
Potassium: 4.5 mmol/L (ref 3.5–5.3)
Sodium: 139 mmol/L (ref 135–146)
TOTAL PROTEIN: 7.3 g/dL (ref 6.1–8.1)

## 2016-05-10 LAB — CBC WITH DIFFERENTIAL/PLATELET
BASOS PCT: 1 %
Basophils Absolute: 40 cells/uL (ref 0–200)
EOS PCT: 1 %
Eosinophils Absolute: 40 cells/uL (ref 15–500)
HCT: 36.6 % — ABNORMAL LOW (ref 38.5–50.0)
Hemoglobin: 11.9 g/dL — ABNORMAL LOW (ref 13.2–17.1)
Lymphocytes Relative: 35 %
Lymphs Abs: 1400 cells/uL (ref 850–3900)
MCH: 32.7 pg (ref 27.0–33.0)
MCHC: 32.5 g/dL (ref 32.0–36.0)
MCV: 100.5 fL — ABNORMAL HIGH (ref 80.0–100.0)
MONOS PCT: 10 %
MPV: 9.5 fL (ref 7.5–12.5)
Monocytes Absolute: 400 cells/uL (ref 200–950)
NEUTROS ABS: 2120 {cells}/uL (ref 1500–7800)
Neutrophils Relative %: 53 %
PLATELETS: 183 10*3/uL (ref 140–400)
RBC: 3.64 MIL/uL — AB (ref 4.20–5.80)
RDW: 14.6 % (ref 11.0–15.0)
WBC: 4 10*3/uL (ref 3.8–10.8)

## 2016-05-10 LAB — TSH: TSH: 5.37 m[IU]/L — AB (ref 0.40–4.50)

## 2016-05-10 LAB — T4, FREE: FREE T4: 1 ng/dL (ref 0.8–1.8)

## 2016-05-10 MED ORDER — NYSTATIN 100000 UNIT/GM EX POWD: Freq: Two times a day (BID) | CUTANEOUS | 3 refills | Status: DC

## 2016-05-10 MED FILL — GABAPENTIN 300 MG CAPSULE: 300 | 30 days supply | Qty: 120 | Fill #1

## 2016-05-10 MED FILL — NYSTOP 100,000 UNITS/GM PWD: 100000 | 20 days supply | Qty: 30 | Fill #0

## 2016-05-10 NOTE — Progress Notes (Signed)
Patient ID: Bryan Gilmore., male   DOB: 1950-11-06, 65 y.o.   MRN: [id number]    Location:  [facility] Place of Service: OFFICE  Chief Complaint  Patient presents with  . Medical Management of Chronic Issues    4 month follow up  . Other    discuss Flu vacc    HPI:  65 yo male seen today for f/u. He c/o decreased hearing and would like hearing test. No other concerns.  skin rash - stable. He never was able to pick up clotrimazole-betamethasone cream due to cost.  He continues to itch. He is out of nystatin powder.  afib - rate controlled. Currently taking coreg  HTN - stable on losartan. Hydralazine stopped by nephro  CP/hx MI - had nuclear stress test in Dec 2016 that showed fixed defect apical and inferior wall c/w prior infarct; EF 48%. He no longer sees cardio  ESRD/HD - TThSa via right forearm AVF; no issues. Takes fosrenal  Gout - no attacks since his last visit. Takes allopurinol  Joint pain/neuropathy - takes ibuprofen prn and gabapentin  HIV - followed by Dr Baxter Flattery. CD4 730. Viral load quant 77; log 1.89. He is on 4 HAART meds. no longer on isentress and takes tivicay instead  Hypothyroidism - no cold intolerance/palpitations. He takes levothyroxine   Past Medical History:  Diagnosis Date  . Acute respiratory failure ([place])   . Alcohol abuse   . Altered mental status   . Anemia   . Anemia in chronic kidney disease(285.21)   . Arthritis    "joints" (10/12/2014)  . Atrial fibrillation ([place])   . Colostomy status ([place])   . Cough   . Disorders of phosphorus metabolism   . Dysrhythmia    AFib, not a candidate for coumadin , dr [name] signed off on pt  . End stage renal disease ([place])   . ESRD (end stage renal disease) on dialysis ([place])    "TTS; [facility] " (10/12/2014)  . Fournier's gangrene   . Gangrene ([place])   . GERD (gastroesophageal reflux disease)   . Gout   . Gouty arthritis   . Hemoptysis, unspecified   . Hepatitis A   . Hepatitis B   .  Hepatitis C    "I've been treated for all of them" (10/12/2014)  . HIV (human immunodeficiency virus infection) ([place])   . Hypertension   . Hypopotassemia   . Hypothyroidism   . Myocardial infarction [facility])    "told by a dr I had evidence of one that I didn't know about" (10/12/2014)  . Other severe protein-calorie malnutrition   . Peripheral vascular disease, unspecified ([place])    denies  . Sebaceous cyst   . Sepsis(995.91)   . Sepsis(995.91)   . Severe protein-calorie malnutrition ([place])   . Ulcer of lower limb, unspecified   . Ulcer of lower limb, unspecified   . Unspecified constipation     Past Surgical History:  Procedure Laterality Date  . AV FISTULA PLACEMENT  04/12/2012   Procedure: INSERTION OF ARTERIOVENOUS (AV) GORE-TEX GRAFT ARM;  Surgeon: Rosetta Posner, MD;  Location: [facility] OR;  Service: Vascular;  Laterality: Left;  insertion of 6x50 stretch goretex graft   . AV FISTULA PLACEMENT  08/14/2012   Procedure: INSERTION OF ARTERIOVENOUS (AV) GORE-TEX GRAFT ARM;  Surgeon: Mal Misty, MD;  Location: [place];  Service: Vascular;  Laterality: Left;  . AV FISTULA PLACEMENT Right 11/08/2012   Procedure: ARTERIOVENOUS (AV) FISTULA CREATION;  Surgeon:  Angelia Mould, MD;  Location: [place];  Service: Vascular;  Laterality: Right;  . BOWEL RESECTION N/A 02/21/2013   Procedure: SMALL BOWEL RESECTION;  Surgeon: [name], MD;  Location: [place];  Service: General;  Laterality: N/A;  . COLOSTOMY  02/2012   d/t an infection  . COLOSTOMY  13  . COLOSTOMY CLOSURE N/A 02/21/2013   Procedure: COLOSTOMY CLOSURE;  Surgeon: [name], MD;  Location: [place];  Service: General;  Laterality: N/A;  takedown of sigmoid colostomy with anastomosis  . CYSTOSCOPY  07/30/2012   Procedure: CYSTOSCOPY;  Surgeon: Reece Packer, MD;  Location: [facility];  Service: Urology;  Laterality: Bilateral;  retrograde  . CYSTOSCOPY WITH URETHRAL DILATATION  02/17/2012   Procedure: CYSTOSCOPY WITH URETHRAL  DILATATION;  Surgeon: Reece Packer, MD;  Location: [facility];  Service: Urology;  Laterality: N/A;  retrograde urethragram, placement of foley catheter exam under anesthesia   . FISTULOGRAM Right 01/06/2013   Procedure: FISTULOGRAM;  Surgeon: Angelia Mould, MD;  Location: [facility] CATH LAB;  Service: Cardiovascular;  Laterality: Right;  . HERNIA REPAIR    . INSERTION OF DIALYSIS CATHETER  02/20/2012   Procedure: INSERTION OF DIALYSIS CATHETER;  Surgeon: Angelia Mould, MD;  Location: [place];  Service: Vascular;  Laterality: N/A;  . INSERTION OF DIALYSIS CATHETER  03/29/2012   Procedure: INSERTION OF DIALYSIS CATHETER;  Surgeon: Rosetta Posner, MD;  Location: [facility] OR;  Service: Vascular;  Laterality: N/A;  Insertion tunneled dialysis catheter right IJ  . INSERTION OF DIALYSIS CATHETER  08/04/2012   Procedure: INSERTION OF DIALYSIS CATHETER;  Surgeon: Angelia Mould, MD;  Location: [place];  Service: Vascular;  Laterality: Right;  right intrajugular hemodialysis catheter insertion  . INSERTION OF MESH N/A 10/12/2014   Procedure: INSERTION OF MESH;  Surgeon: Excell Seltzer, MD;  Location: [place];  Service: General;  Laterality: N/A;  . IRRIGATION AND DEBRIDEMENT ABSCESS  02/29/2012   Procedure: IRRIGATION AND DEBRIDEMENT ABSCESS;  Surgeon: Reece Packer, MD;  Location: [place];  Service: Urology;  Laterality: N/A;  I&D of scrotum abcess  . Surgery for Fournier's gangrene  02/2012  . THROMBECTOMY AND REVISION OF ARTERIOVENTOUS (AV) GORETEX  GRAFT  07/15/2012   Procedure: THROMBECTOMY AND REVISION OF ARTERIOVENTOUS (AV) GORETEX  GRAFT;  Surgeon: Elam Dutch, MD;  Location: [place];  Service: Vascular;  Laterality: Left;  . URETHROGRAM  07/30/2012   Procedure: URETHROGRAM;  Surgeon: Reece Packer, MD;  [facility];  Service: Urology;  Laterality: N/A;  cystogram  . VENTRAL HERNIA REPAIR  10/12/2014  . VENTRAL HERNIA REPAIR N/A 10/12/2014   Procedure: REPAIR VENTRAL INCISIONAL  HERNIA;  Surgeon: Excell Seltzer, MD;  Location: [place];  Service: General;  Laterality: N/A;    Patient Care Team: Gildardo Cranker, DO as PCP - General (Internal Medicine) Thayer Headings, MD as PCP - Infectious Diseases (Infectious Diseases) Minda Ditto, RT as Technician Corliss Parish, MD as Consulting Physician (Nephrology)  Social History   Social History  . Marital status: Married    Spouse name: N/A  . Number of children: 0  . Years of education: N/A   Occupational History  . [employer]   Social History Main Topics  . Smoking status: Never Smoker  . Smokeless tobacco: Never Used  . Alcohol use No  . Drug use: No  . Sexual activity: No     Comment: declined condoms   Other Topics  Concern  . Not on file   Social History Narrative   Single.  Unemployed.  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       reports that he has never smoked. He has never used smokeless tobacco. He reports that he does not drink alcohol or use drugs.  Family History  Problem Relation Age of Onset  . Hypertension Mother   . Hypertension Father   . Cancer Cousin     breast   Family Status  Relation Status  . Mother Deceased   Cause of Death: Gunshot wound  . Father Deceased   Cause of Death: Natural causes  . Sister Alive  . Brother Alive  . Daughter Alive  . Sister Alive  . Sister Alive  . Sister Alive  . Sister Alive  . Daughter Alive  . Cousin Deceased     No Known Allergies  Medications: Patient's Medications  New Prescriptions   No medications on file  Previous Medications   ALLOPURINOL (ZYLOPRIM) 300 MG TABLET    TAKE ONE TABLET BY MOUTH ONCE DAILY FOR GOUT   CARVEDILOL (COREG) 3.125 MG TABLET    Take 1 tablet (3.125 mg total) by mouth 2 (two) times daily with a meal.   DOLUTEGRAVIR (TIVICAY) 50 MG TABLET    Take 1 tablet (50 mg total) by mouth daily.   GABAPENTIN (NEURONTIN) 300 MG CAPSULE    Take 1 cap po BID and 2 caps po qhs    IBUPROFEN (ADVIL,MOTRIN) 800 MG TABLET    Take 800 mg by mouth every 8 (eight) hours as needed.   LAMIVUDINE (EPIVIR) 100 MG TABLET    TAKE 1/2 TABLET BY MOUTH EVERY DAY   LANTHANUM (FOSRENOL) 1000 MG CHEWABLE TABLET    Chew 1,000 mg by mouth 3 (three) times daily with meals.    LEVOTHYROXINE (SYNTHROID, LEVOTHROID) 100 MCG TABLET    TAKE 1 TABLET BY MOUTH DAILY BEFORE BREAKFAST.   LOSARTAN (COZAAR) 50 MG TABLET    Take 1 tablet by mouth daily.   PREZCOBIX 800-150 MG TABLET    TAKE 1 TABLET BY MOUTH EVERY DAY WITH FOOD   ZIDOVUDINE (RETROVIR) 300 MG TABLET    TAKE 1 TABLET BY MOUTH EVERY DAY  Modified Medications   No medications on file  Discontinued Medications   CLOTRIMAZOLE-BETAMETHASONE (LOTRISONE) CREAM    Apply 1 application topically 2 (two) times daily.    Review of Systems  HENT: Positive for hearing loss.   Gastrointestinal: Positive for abdominal pain.  Musculoskeletal: Positive for arthralgias.  Skin: Positive for rash.  All other systems reviewed and are negative.   Vitals:   05/10/16 0810  BP: 132/84  Pulse: 70  Temp: 97.6 F (36.4 C)  TempSrc: Oral  SpO2: 99%  Weight: 239 lb 6.4 oz (108.6 kg)  Height: _0  (1.803 m)   Body mass index is 33.39 kg/m.  Physical Exam  Constitutional: He is oriented to person, place, and time. He appears well-developed and well-nourished. No distress.  HENT:  Mouth/Throat: Oropharynx is clear and moist. No oropharyngeal exudate.  TMs intact; no cerumen impaction. No redness or d/c  Eyes: Pupils are equal, round, and reactive to light. No scleral icterus.  Neck: Neck supple. Carotid bruit is not present. No thyromegaly present.  Cardiovascular: Normal rate and intact distal pulses.  An irregularly irregular rhythm present. Exam reveals no gallop and no friction rub.   Murmur heard.  Systolic murmur is present with a grade  of 1/6  no distal LE swelling. No calf TTP. Right forearm AVF palpable thrill/audible bruit    Pulmonary/Chest: Effort normal and breath sounds normal. He has no wheezes. He has no rhonchi. He has no rales.  Abdominal: Soft. Bowel sounds are normal. He exhibits distension. He exhibits no abdominal bruit, no pulsatile midline mass and no mass. There is tenderness (right midepigastric). There is no rebound and no guarding.  Musculoskeletal: He exhibits edema and tenderness.  Lymphadenopathy:    He has no cervical adenopathy.    He has no axillary adenopathy.       Right axillary: No pectoral and no lateral adenopathy present.       Left axillary: No pectoral adenopathy present. Neurological: He is alert and oriented to person, place, and time.  Skin: Skin is warm and dry. Rash noted.  Psychiatric: He has a normal mood and affect. His behavior is normal. Judgment and thought content normal.     Labs reviewed: Lab on 04/17/2016  Component Date Value Ref Range Status  . HIV 1 RNA Quant 04/18/2016 77* <20 copies/mL Final  . HIV1 RNA Quant, Log 04/18/2016 1.89* <1.30 Log copies/mL Final   Comment:   This test was performed using the COBAS AmpliPrep/COBAS TaqMan HIV-1 test kit version 2.0. ([place].)   . CD4 T Cell Abs 04/18/2016 730  400 - 2,700 /uL Final  . CD4 % Helper T Cell 04/18/2016 34  33 - 55 % Final  Lab on 02/23/2016  Component Date Value Ref Range Status  . TSH 02/24/2016 0.636  0.450 - 4.500 uIU/mL Final    No results found.   Assessment/Plan   ICD-9-CM ICD-10-CM   1. Pain of upper abdomen 789.09 R10.10 CMP with eGFR     CT Abdomen Pelvis Wo Contrast   right midepigastric  2. ESRD on dialysis ([place]) 585.6 N18.6 CMP with eGFR   V45.11 Z99.2   3. Hearing loss, bilateral 389.9 H91.93 Ambulatory referral to Audiology   with intermittent  4. Hypothyroidism, unspecified hypothyroidism type 244.9 E03.9 TSH     T4, Free  5. Essential hypertension 401.9 I10 CMP with eGFR  6. Anemia of chronic renal failure, stage 4 (severe) (HCC) 285.21 N18.4 CBC  with Differential/Platelets   585.4 D63.1   7. Need for immunization against influenza V04.81 Z23 Flu Vaccine QUAD 36+ mos IM   Will call with lab results  Will call with imaging appt and hearing referral  Continue current medications as ordered  Follow up with specialists as scheduled  Flu shot given today  Follow up in 3 mos for [place]. Perlie Gold  [facility] [address] [place], [place] [address] [phone] Cell (Monday-Friday 8 AM - 5 PM) [phone] After 5 PM and follow prompts

## 2016-05-10 NOTE — Patient Instructions (Addendum)
Will call with lab results  Will call with imaging appt and hearing referral  Continue current medications as ordered  Follow up with specialists as scheduled  Flu shot given today  Follow up in 3 mos for CPE/ECG

## 2016-05-11 ENCOUNTER — Other Ambulatory Visit: Payer: Self-pay

## 2016-05-11 ENCOUNTER — Ambulatory Visit (INDEPENDENT_AMBULATORY_CARE_PROVIDER_SITE_OTHER): Payer: Medicare Other | Admitting: Internal Medicine

## 2016-05-11 ENCOUNTER — Encounter: Payer: Self-pay | Admitting: Internal Medicine

## 2016-05-11 VITALS — BP 176/98 | HR 62 | Temp 97.6°F | Ht 71.0 in | Wt 240.0 lb

## 2016-05-11 DIAGNOSIS — G8929 Other chronic pain: Secondary | ICD-10-CM

## 2016-05-11 DIAGNOSIS — I120 Hypertensive chronic kidney disease with stage 5 chronic kidney disease or end stage renal disease: Secondary | ICD-10-CM | POA: Diagnosis not present

## 2016-05-11 DIAGNOSIS — B182 Chronic viral hepatitis C: Secondary | ICD-10-CM

## 2016-05-11 DIAGNOSIS — N186 End stage renal disease: Secondary | ICD-10-CM | POA: Diagnosis not present

## 2016-05-11 DIAGNOSIS — B20 Human immunodeficiency virus [HIV] disease: Secondary | ICD-10-CM | POA: Diagnosis not present

## 2016-05-11 DIAGNOSIS — R1011 Right upper quadrant pain: Secondary | ICD-10-CM

## 2016-05-11 DIAGNOSIS — R1010 Upper abdominal pain, unspecified: Secondary | ICD-10-CM

## 2016-05-11 DIAGNOSIS — Z992 Dependence on renal dialysis: Secondary | ICD-10-CM | POA: Diagnosis not present

## 2016-05-11 MED ORDER — LEVOTHYROXINE SODIUM 125 MCG PO TABS: 125.0000 ug | ORAL_TABLET | Freq: Every day | ORAL | 2 refills | Status: DC

## 2016-05-11 NOTE — Progress Notes (Signed)
RFV: hospital follow up  Patient ID: Bryan Gilmore., male   DOB: 1951-07-22, 65 y.o.   MRN: [id number]  HPI 65yo M with hiv disease, ESRD, treated hepatitis C. He continues to have good adherence for his medications in general. At our last visit he noticed having upper abdominal discomfort with palpation but not worsened with eating or excess distention. He was seen by his PCP this past week who is getting an abd ct scan to see if there is abdominal wall defect such as a hernia that could explain his discomfort. He is otherwise is in good state of health.  Outpatient Encounter Prescriptions as of 05/11/2016  Medication Sig  . allopurinol (ZYLOPRIM) 300 MG tablet TAKE ONE TABLET BY MOUTH ONCE DAILY FOR GOUT  . carvedilol (COREG) 3.125 MG tablet Take 1 tablet (3.125 mg total) by mouth 2 (two) times daily with a meal.  . dolutegravir (TIVICAY) 50 MG tablet Take 1 tablet (50 mg total) by mouth daily.  Marland Kitchen gabapentin (NEURONTIN) 300 MG capsule Take 1 cap po BID and 2 caps po qhs  . ibuprofen (ADVIL,MOTRIN) 800 MG tablet Take 800 mg by mouth every 8 (eight) hours as needed.  . lamivudine (EPIVIR) 100 MG tablet TAKE 1/2 TABLET BY MOUTH EVERY DAY  . levothyroxine (SYNTHROID, LEVOTHROID) 100 MCG tablet TAKE 1 TABLET BY MOUTH DAILY BEFORE BREAKFAST.  Marland Kitchen losartan (COZAAR) 50 MG tablet Take 1 tablet by mouth daily.  Marland Kitchen nystatin (MYCOSTATIN/NYSTOP) powder Apply topically 2 (two) times daily.  Marland Kitchen PREZCOBIX 800-150 MG tablet TAKE 1 TABLET BY MOUTH EVERY DAY WITH FOOD  . zidovudine (RETROVIR) 300 MG tablet TAKE 1 TABLET BY MOUTH EVERY DAY  . lanthanum (FOSRENOL) 1000 MG chewable tablet Chew 1,000 mg by mouth 3 (three) times daily with meals.    No facility-administered encounter medications on file as of 05/11/2016.      Patient Active Problem List   Diagnosis Date Noted  . ESRD on dialysis ([place]) 05/10/2016  . Chronic atrial fibrillation ([place]) 05/10/2016  . Peripheral neuropathy ([place]) 05/10/2016  .  Left axillary swelling 01/05/2016  . Lump of skin of lower extremity 07/25/2015  . Hypertensive urgency 12/21/2014  . Hypothyroid 12/21/2014  . Chronic hepatitis C without hepatic coma ([place]) 11/25/2014  . Thyroid activity decreased 07/29/2014  . Essential hypertension 06/24/2014  . Floaters in visual field 06/24/2014  . Atrial fibrillation, unspecified 06/24/2014  . Esophageal reflux 06/24/2014  . Gynecomastia 11/12/2013  . Alcohol abuse   . Atrial fibrillation, permanent ([place]) 04/04/2012  . Secondary hyperparathyroidism (of renal origin) 03/29/2012  . Anemia of chronic renal failure 02/29/2012  . Normocytic anemia 02/17/2012  . Acute hepatitis C virus infection 10/07/2010  . Chronic gouty arthropathy 10/07/2010  . 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 Human immunodeficiency virus (HIV) disease ([place]) 09/15/2010     Health Maintenance Due  Topic Date Due  . COLONOSCOPY  11/18/2000     Review of Systems + abdominal pain upper  Physical Exam   BP (!) 176/98 Comment: asymptomatic; scheduled dialysis at 11 today  Pulse 62   Temp 97.6 F (36.4 C) (Oral)   Ht 5\' 11"  (1.803 m)   Wt 240 lb (108.9 kg)   BMI 33.47 kg/m  Physical Exam  Constitutional: He is oriented to person, place, and time. He appears well-developed and well-nourished. No distress.  HENT:  Mouth/Throat: Oropharynx is clear and moist. No oropharyngeal exudate.  Cardiovascular: Normal rate, regular rhythm and normal heart sounds. Exam reveals  no gallop and no friction rub.  No murmur heard.  Pulmonary/Chest: Effort normal and breath sounds normal. No respiratory distress. He has no wheezes.  Abdominal: Soft. Bowel sounds are normal. Protuberant abdomen, mild tenderness with deep palpation to near epigastric area but does have a bulge noted in the area that is suspicious for hernia  Lymphadenopathy:  He has no cervical adenopathy.  Neurological: He is alert and oriented to person, place, and time.  Skin:  Skin is warm and dry. No rash noted. No erythema.  Psychiatric: He has a normal mood and affect. His behavior is normal.    Lab Results  Component Value Date   CD4TCELL 34 04/17/2016   Lab Results  Component Value Date   CD4TABS 730 04/17/2016   CD4TABS 740 11/29/2015   CD4TABS 320 (L) 09/22/2015   Lab Results  Component Value Date   HIV1RNAQUANT 77 (H) 04/17/2016   Lab Results  Component Value Date   HEPBSAB NEGATIVE 02/18/2012   No results found for: RPR  CBC Lab Results  Component Value Date   WBC 4.0 05/10/2016   RBC 3.64 (L) 05/10/2016   HGB 11.9 (L) 05/10/2016   HCT 36.6 (L) 05/10/2016   PLT 183 05/10/2016   MCV 100.5 (H) 05/10/2016   MCH 32.7 05/10/2016   MCHC 32.5 05/10/2016   RDW 14.6 05/10/2016   LYMPHSABS 1,400 05/10/2016   MONOABS 400 05/10/2016   EOSABS 40 05/10/2016   BASOSABS 40 05/10/2016   BMET Lab Results  Component Value Date   NA 139 05/10/2016   K 4.5 05/10/2016   CL 98 05/10/2016   CO2 27 05/10/2016   GLUCOSE 92 05/10/2016   BUN 36 (H) 05/10/2016   CREATININE 7.28 (H) 05/10/2016   CALCIUM 9.6 05/10/2016   GFRNONAA 7 (L) 05/10/2016   GFRAA 8 (L) 05/10/2016     Assessment and Plan  Upper abdominal pain = appears to be abdominal hernia. pcp has arranged for ct scan to evaluate abdominal wall. If CT is normal, would consider getting EGD  Treated hepatitis C = he is due for getting repeat ultrasound or CT of abdomen to evaluate liver for [facility] screening.  Health maintenance = received flu at PCP this past week  HIv disease = his cd 4 count is stable but does have a small Viral blip. Will likely be back to undetectable at next blood draw in 3 months

## 2016-05-16 DIAGNOSIS — Z992 Dependence on renal dialysis: Secondary | ICD-10-CM | POA: Diagnosis not present

## 2016-05-16 DIAGNOSIS — N186 End stage renal disease: Secondary | ICD-10-CM | POA: Diagnosis not present

## 2016-05-18 ENCOUNTER — Encounter: Payer: Self-pay | Admitting: Internal Medicine

## 2016-05-18 DIAGNOSIS — Z992 Dependence on renal dialysis: Secondary | ICD-10-CM | POA: Diagnosis not present

## 2016-05-18 DIAGNOSIS — N186 End stage renal disease: Secondary | ICD-10-CM | POA: Diagnosis not present

## 2016-05-20 DIAGNOSIS — N186 End stage renal disease: Secondary | ICD-10-CM | POA: Diagnosis not present

## 2016-05-20 DIAGNOSIS — Z992 Dependence on renal dialysis: Secondary | ICD-10-CM | POA: Diagnosis not present

## 2016-05-23 DIAGNOSIS — Z992 Dependence on renal dialysis: Secondary | ICD-10-CM | POA: Diagnosis not present

## 2016-05-23 DIAGNOSIS — N186 End stage renal disease: Secondary | ICD-10-CM | POA: Diagnosis not present

## 2016-05-25 DIAGNOSIS — Z992 Dependence on renal dialysis: Secondary | ICD-10-CM | POA: Diagnosis not present

## 2016-05-25 DIAGNOSIS — N186 End stage renal disease: Secondary | ICD-10-CM | POA: Diagnosis not present

## 2016-05-30 DIAGNOSIS — D631 Anemia in chronic kidney disease: Secondary | ICD-10-CM | POA: Diagnosis not present

## 2016-05-30 DIAGNOSIS — N2581 Secondary hyperparathyroidism of renal origin: Secondary | ICD-10-CM | POA: Diagnosis not present

## 2016-05-30 DIAGNOSIS — D509 Iron deficiency anemia, unspecified: Secondary | ICD-10-CM | POA: Diagnosis not present

## 2016-05-30 DIAGNOSIS — N186 End stage renal disease: Secondary | ICD-10-CM | POA: Diagnosis not present

## 2016-06-01 DIAGNOSIS — N186 End stage renal disease: Secondary | ICD-10-CM | POA: Diagnosis not present

## 2016-06-01 DIAGNOSIS — N2581 Secondary hyperparathyroidism of renal origin: Secondary | ICD-10-CM | POA: Diagnosis not present

## 2016-06-01 DIAGNOSIS — D631 Anemia in chronic kidney disease: Secondary | ICD-10-CM | POA: Diagnosis not present

## 2016-06-01 DIAGNOSIS — D509 Iron deficiency anemia, unspecified: Secondary | ICD-10-CM | POA: Diagnosis not present

## 2016-06-03 DIAGNOSIS — D631 Anemia in chronic kidney disease: Secondary | ICD-10-CM | POA: Diagnosis not present

## 2016-06-03 DIAGNOSIS — N186 End stage renal disease: Secondary | ICD-10-CM | POA: Diagnosis not present

## 2016-06-03 DIAGNOSIS — D509 Iron deficiency anemia, unspecified: Secondary | ICD-10-CM | POA: Diagnosis not present

## 2016-06-03 DIAGNOSIS — N2581 Secondary hyperparathyroidism of renal origin: Secondary | ICD-10-CM | POA: Diagnosis not present

## 2016-06-05 DIAGNOSIS — L84 Corns and callosities: Secondary | ICD-10-CM | POA: Diagnosis not present

## 2016-06-05 DIAGNOSIS — L602 Onychogryphosis: Secondary | ICD-10-CM | POA: Diagnosis not present

## 2016-06-05 DIAGNOSIS — N186 End stage renal disease: Secondary | ICD-10-CM | POA: Diagnosis not present

## 2016-06-06 DIAGNOSIS — N186 End stage renal disease: Secondary | ICD-10-CM | POA: Diagnosis not present

## 2016-06-06 DIAGNOSIS — N2581 Secondary hyperparathyroidism of renal origin: Secondary | ICD-10-CM | POA: Diagnosis not present

## 2016-06-06 DIAGNOSIS — D509 Iron deficiency anemia, unspecified: Secondary | ICD-10-CM | POA: Diagnosis not present

## 2016-06-06 DIAGNOSIS — D631 Anemia in chronic kidney disease: Secondary | ICD-10-CM | POA: Diagnosis not present

## 2016-06-07 ENCOUNTER — Ambulatory Visit
Admission: RE | Admit: 2016-06-07 | Discharge: 2016-06-07 | Disposition: A | Discharge status: Home / Self Care | Payer: Medicare Other | Source: Ambulatory Visit | Attending: Internal Medicine | Admitting: Internal Medicine

## 2016-06-07 DIAGNOSIS — R1010 Upper abdominal pain, unspecified: Secondary | ICD-10-CM

## 2016-06-07 DIAGNOSIS — R1013 Epigastric pain: Secondary | ICD-10-CM | POA: Diagnosis not present

## 2016-06-08 DIAGNOSIS — D631 Anemia in chronic kidney disease: Secondary | ICD-10-CM | POA: Diagnosis not present

## 2016-06-08 DIAGNOSIS — D509 Iron deficiency anemia, unspecified: Secondary | ICD-10-CM | POA: Diagnosis not present

## 2016-06-08 DIAGNOSIS — N2581 Secondary hyperparathyroidism of renal origin: Secondary | ICD-10-CM | POA: Diagnosis not present

## 2016-06-08 DIAGNOSIS — N186 End stage renal disease: Secondary | ICD-10-CM | POA: Diagnosis not present

## 2016-06-10 DIAGNOSIS — N2581 Secondary hyperparathyroidism of renal origin: Secondary | ICD-10-CM | POA: Diagnosis not present

## 2016-06-10 DIAGNOSIS — N186 End stage renal disease: Secondary | ICD-10-CM | POA: Diagnosis not present

## 2016-06-10 DIAGNOSIS — Z992 Dependence on renal dialysis: Secondary | ICD-10-CM | POA: Diagnosis not present

## 2016-06-10 DIAGNOSIS — I120 Hypertensive chronic kidney disease with stage 5 chronic kidney disease or end stage renal disease: Secondary | ICD-10-CM | POA: Diagnosis not present

## 2016-06-10 DIAGNOSIS — D631 Anemia in chronic kidney disease: Secondary | ICD-10-CM | POA: Diagnosis not present

## 2016-06-10 DIAGNOSIS — D509 Iron deficiency anemia, unspecified: Secondary | ICD-10-CM | POA: Diagnosis not present

## 2016-06-13 DIAGNOSIS — D631 Anemia in chronic kidney disease: Secondary | ICD-10-CM | POA: Diagnosis not present

## 2016-06-13 DIAGNOSIS — N186 End stage renal disease: Secondary | ICD-10-CM | POA: Diagnosis not present

## 2016-06-13 DIAGNOSIS — N2581 Secondary hyperparathyroidism of renal origin: Secondary | ICD-10-CM | POA: Diagnosis not present

## 2016-06-14 ENCOUNTER — Ambulatory Visit (INDEPENDENT_AMBULATORY_CARE_PROVIDER_SITE_OTHER): Payer: Medicare Other | Admitting: Internal Medicine

## 2016-06-14 ENCOUNTER — Encounter: Payer: Self-pay | Admitting: Internal Medicine

## 2016-06-14 VITALS — BP 146/88 | HR 85 | Temp 98.7°F | Ht 71.0 in | Wt 239.0 lb

## 2016-06-14 DIAGNOSIS — R309 Painful micturition, unspecified: Secondary | ICD-10-CM | POA: Diagnosis not present

## 2016-06-14 DIAGNOSIS — N3001 Acute cystitis with hematuria: Secondary | ICD-10-CM

## 2016-06-14 DIAGNOSIS — N390 Urinary tract infection, site not specified: Secondary | ICD-10-CM | POA: Insufficient documentation

## 2016-06-14 LAB — POCT URINALYSIS DIPSTICK
Glucose, UA: NEGATIVE
Ketones, UA: NEGATIVE
NITRITE UA: NEGATIVE
PH UA: 8.5
Spec Grav, UA: <=1.005
Urobilinogen, UA: NEGATIVE

## 2016-06-14 MED ORDER — SULFAMETHOXAZOLE-TRIMETHOPRIM 800-160 MG PO TABS: ORAL_TABLET | ORAL | 0 refills | Status: DC

## 2016-06-14 NOTE — Progress Notes (Signed)
Facility  [place]    Place of Service:   OFFICE    No Known Allergies  Chief Complaint  Patient presents with  . Acute Visit    pain with urination for 2-3 days, no blood in urine    HPI:  Pain with initiation of urination for 3 days. Denies fever or chills.  Patient has ESRD and is on dialysis. No recent catheterizations.  History of fFournier's gangrene in 2013 and multiple surgeries following that.   Medications: Patient's Medications  New Prescriptions   No medications on file  Previous Medications   ALLOPURINOL (ZYLOPRIM) 300 MG TABLET    TAKE ONE TABLET BY MOUTH ONCE DAILY FOR GOUT   CARVEDILOL (COREG) 3.125 MG TABLET    Take 1 tablet (3.125 mg total) by mouth 2 (two) times daily with a meal.   DOLUTEGRAVIR (TIVICAY) 50 MG TABLET    Take 1 tablet (50 mg total) by mouth daily.   GABAPENTIN (NEURONTIN) 300 MG CAPSULE    Take 1 cap po BID and 2 caps po qhs   IBUPROFEN (ADVIL,MOTRIN) 800 MG TABLET    Take 800 mg by mouth every 8 (eight) hours as needed.   LAMIVUDINE (EPIVIR) 100 MG TABLET    TAKE 1/2 TABLET BY MOUTH EVERY DAY   LANTHANUM (FOSRENOL) 1000 MG CHEWABLE TABLET    Chew 1,000 mg by mouth 3 (three) times daily with meals.    LEVOTHYROXINE (SYNTHROID, LEVOTHROID) 125 MCG TABLET    Take 1 tablet (125 mcg total) by mouth daily before breakfast.   LOSARTAN (COZAAR) 50 MG TABLET    Take 1 tablet by mouth daily.   NYSTATIN (MYCOSTATIN/NYSTOP) POWDER    Apply topically 2 (two) times daily.   PREZCOBIX 800-150 MG TABLET    TAKE 1 TABLET BY MOUTH EVERY DAY WITH FOOD   ZIDOVUDINE (RETROVIR) 300 MG TABLET    TAKE 1 TABLET BY MOUTH EVERY DAY  Modified Medications   No medications on file  Discontinued Medications   No medications on file    Review of Systems  Constitutional: Negative for activity change, appetite change, fatigue and unexpected weight change.  HENT: Negative for congestion and hearing loss.   Eyes: Negative.   Respiratory: Negative for cough and  shortness of breath.   Cardiovascular: Negative for chest pain, palpitations and leg swelling.  Gastrointestinal: Negative for abdominal pain, constipation and diarrhea.  Genitourinary: Negative for difficulty urinating.       History of Fournier's gangrene in 2013. Had multiple surgeries. ESRD, on dialysis Tuesday, Thursday, Saturday . Impotent.  Musculoskeletal: Negative for arthralgias and myalgias.  Skin: Negative for color change and wound.  Neurological: Negative for dizziness, weakness and headaches.  Psychiatric/Behavioral: Negative for agitation, behavioral problems and confusion.    Vitals:   06/14/16 1627  BP: (!) 146/88  Pulse: 85  Temp: 98.7 F (37.1 C)  TempSrc: Oral  SpO2: 95%  Weight: 239 lb (108.4 kg)  Height: '5\' 11"'  (1.803 m)   Body mass index is 33.33 kg/m. Wt Readings from Last 3 Encounters:  06/14/16 239 lb (108.4 kg)  05/11/16 240 lb (108.9 kg)  05/10/16 239 lb 6.4 oz (108.6 kg)      Physical Exam  Constitutional: He is oriented to person, place, and time. He appears well-developed and well-nourished. No distress.  obese  HENT:  Head: Normocephalic and atraumatic.  Mouth/Throat: Oropharynx is clear and moist. No oropharyngeal exudate.  Eyes: Conjunctivae and EOM are normal. Pupils are equal, round,  and reactive to light.  Neck: Normal range of motion. Neck supple.  Cardiovascular: Normal rate, regular rhythm and normal heart sounds.   Pulmonary/Chest: Effort normal and breath sounds normal.  Abdominal: Soft. Bowel sounds are normal.  Musculoskeletal: He exhibits no edema or tenderness.  Neurological: He is alert and oriented to person, place, and time.  Skin: Skin is warm and dry. He is not diaphoretic.  Psychiatric: He has a normal mood and affect.    Labs reviewed: Lab Summary Latest Ref Rng & Units 05/10/2016 01/05/2016 09/22/2015  Hemoglobin 13.2 - 17.1 g/dL 11.9(L) 12.6 (None)  Hematocrit 38.5 - 50.0 % 36.6(L) 38.2 (None)  White count 3.8  - 10.8 K/uL 4.0 5.1 (None)  Platelet count 140 - 400 K/uL 183 195 (None)  Sodium 135 - 146 mmol/L 139 142 (None)  Potassium 3.5 - 5.3 mmol/L 4.5 4.1 (None)  Calcium 8.6 - 10.3 mg/dL 9.6 8.5(L) (None)  Phosphorus - (None) (None) (None)  Creatinine 0.70 - 1.25 mg/dL 7.28(H) 8.50(H) (None)  AST 10 - 35 U/L 10 18 (None)  Alk Phos 40 - 115 U/L 49 59 (None)  Bilirubin 0.2 - 1.2 mg/dL 0.4 0.3 (None)  Glucose 65 - 99 mg/dL 92 89 (None)  Cholesterol 125 - 200 mg/dL (None) (None) 194  HDL cholesterol >=40 mg/dL (None) (None) 62  Triglycerides <150 mg/dL (None) (None) 94  LDL Direct - (None) (None) (None)  LDL Calc <130 mg/dL (None) (None) 113  Total protein 6.1 - 8.1 g/dL 7.3 (None) (None)  Albumin 3.6 - 5.1 g/dL 3.7 4.3 (None)  Some recent data might be hidden   Lab Results  Component Value Date   TSH 5.37 (H) 05/10/2016   TSH 0.636 02/23/2016   TSH 4.800 (H) 01/05/2016   T3TOTAL 170 06/24/2014   T4TOTAL 7.0 06/24/2014   Lab Results  Component Value Date   BUN 36 (H) 05/10/2016   BUN 39 (H) 01/05/2016   BUN 49 (H) 06/30/2015   Lab Results  Component Value Date   HGBA1C 4.7 (L) 12/22/2014    Assessment/Plan  1. Pain with urination - POC Urinalysis Dipstick - Culture, Urine; Future - Culture, Urine - Culture, Urine  2. Acute cystitis with hematuria - sulfamethoxazole-trimethoprim (BACTRIM DS,SEPTRA DS) 800-160 MG tablet; One twice daily to treat infection  Dispense: 20 tablet; Refill: 0

## 2016-06-15 LAB — URINE CULTURE

## 2016-06-21 ENCOUNTER — Other Ambulatory Visit: Payer: Self-pay | Admitting: Internal Medicine

## 2016-06-21 ENCOUNTER — Other Ambulatory Visit: Payer: Self-pay

## 2016-06-21 DIAGNOSIS — R309 Painful micturition, unspecified: Secondary | ICD-10-CM

## 2016-06-21 DIAGNOSIS — I482 Chronic atrial fibrillation, unspecified: Secondary | ICD-10-CM

## 2016-06-21 MED FILL — CARVEDILOL 3.125 MG TABLET: 3.125 | 30 days supply | Qty: 60 | Fill #0

## 2016-06-21 MED FILL — GABAPENTIN 300 MG CAPSULE: 300 | 30 days supply | Qty: 120 | Fill #2

## 2016-06-26 ENCOUNTER — Other Ambulatory Visit: Payer: Medicare Other

## 2016-06-26 ENCOUNTER — Other Ambulatory Visit: Payer: Self-pay

## 2016-06-26 DIAGNOSIS — E039 Hypothyroidism, unspecified: Secondary | ICD-10-CM | POA: Diagnosis not present

## 2016-06-26 LAB — TSH: TSH: 0.76 m[IU]/L (ref 0.40–4.50)

## 2016-07-05 ENCOUNTER — Other Ambulatory Visit: Payer: Medicare Other

## 2016-07-05 DIAGNOSIS — R309 Painful micturition, unspecified: Secondary | ICD-10-CM | POA: Diagnosis not present

## 2016-07-07 LAB — URINE CULTURE

## 2016-07-11 DIAGNOSIS — I120 Hypertensive chronic kidney disease with stage 5 chronic kidney disease or end stage renal disease: Secondary | ICD-10-CM | POA: Diagnosis not present

## 2016-07-11 DIAGNOSIS — N186 End stage renal disease: Secondary | ICD-10-CM | POA: Diagnosis not present

## 2016-07-11 DIAGNOSIS — Z992 Dependence on renal dialysis: Secondary | ICD-10-CM | POA: Diagnosis not present

## 2016-07-13 DIAGNOSIS — D509 Iron deficiency anemia, unspecified: Secondary | ICD-10-CM | POA: Diagnosis not present

## 2016-07-13 DIAGNOSIS — N186 End stage renal disease: Secondary | ICD-10-CM | POA: Diagnosis not present

## 2016-07-13 DIAGNOSIS — D631 Anemia in chronic kidney disease: Secondary | ICD-10-CM | POA: Diagnosis not present

## 2016-07-13 DIAGNOSIS — N2581 Secondary hyperparathyroidism of renal origin: Secondary | ICD-10-CM | POA: Diagnosis not present

## 2016-07-15 DIAGNOSIS — N2581 Secondary hyperparathyroidism of renal origin: Secondary | ICD-10-CM | POA: Diagnosis not present

## 2016-07-15 DIAGNOSIS — D509 Iron deficiency anemia, unspecified: Secondary | ICD-10-CM | POA: Diagnosis not present

## 2016-07-15 DIAGNOSIS — N186 End stage renal disease: Secondary | ICD-10-CM | POA: Diagnosis not present

## 2016-07-15 DIAGNOSIS — D631 Anemia in chronic kidney disease: Secondary | ICD-10-CM | POA: Diagnosis not present

## 2016-07-18 DIAGNOSIS — N186 End stage renal disease: Secondary | ICD-10-CM | POA: Diagnosis not present

## 2016-07-18 DIAGNOSIS — D509 Iron deficiency anemia, unspecified: Secondary | ICD-10-CM | POA: Diagnosis not present

## 2016-07-18 DIAGNOSIS — D631 Anemia in chronic kidney disease: Secondary | ICD-10-CM | POA: Diagnosis not present

## 2016-07-18 DIAGNOSIS — N2581 Secondary hyperparathyroidism of renal origin: Secondary | ICD-10-CM | POA: Diagnosis not present

## 2016-07-20 DIAGNOSIS — D631 Anemia in chronic kidney disease: Secondary | ICD-10-CM | POA: Diagnosis not present

## 2016-07-20 DIAGNOSIS — D509 Iron deficiency anemia, unspecified: Secondary | ICD-10-CM | POA: Diagnosis not present

## 2016-07-20 DIAGNOSIS — N186 End stage renal disease: Secondary | ICD-10-CM | POA: Diagnosis not present

## 2016-07-20 DIAGNOSIS — N2581 Secondary hyperparathyroidism of renal origin: Secondary | ICD-10-CM | POA: Diagnosis not present

## 2016-07-22 DIAGNOSIS — D509 Iron deficiency anemia, unspecified: Secondary | ICD-10-CM | POA: Diagnosis not present

## 2016-07-22 DIAGNOSIS — D631 Anemia in chronic kidney disease: Secondary | ICD-10-CM | POA: Diagnosis not present

## 2016-07-22 DIAGNOSIS — N186 End stage renal disease: Secondary | ICD-10-CM | POA: Diagnosis not present

## 2016-07-22 DIAGNOSIS — N2581 Secondary hyperparathyroidism of renal origin: Secondary | ICD-10-CM | POA: Diagnosis not present

## 2016-07-25 DIAGNOSIS — N186 End stage renal disease: Secondary | ICD-10-CM | POA: Diagnosis not present

## 2016-07-25 DIAGNOSIS — N2581 Secondary hyperparathyroidism of renal origin: Secondary | ICD-10-CM | POA: Diagnosis not present

## 2016-07-25 DIAGNOSIS — D509 Iron deficiency anemia, unspecified: Secondary | ICD-10-CM | POA: Diagnosis not present

## 2016-07-25 DIAGNOSIS — D631 Anemia in chronic kidney disease: Secondary | ICD-10-CM | POA: Diagnosis not present

## 2016-07-27 DIAGNOSIS — N186 End stage renal disease: Secondary | ICD-10-CM | POA: Diagnosis not present

## 2016-07-27 DIAGNOSIS — D631 Anemia in chronic kidney disease: Secondary | ICD-10-CM | POA: Diagnosis not present

## 2016-07-27 DIAGNOSIS — D509 Iron deficiency anemia, unspecified: Secondary | ICD-10-CM | POA: Diagnosis not present

## 2016-07-27 DIAGNOSIS — N2581 Secondary hyperparathyroidism of renal origin: Secondary | ICD-10-CM | POA: Diagnosis not present

## 2016-07-31 ENCOUNTER — Other Ambulatory Visit: Payer: Medicare Other

## 2016-07-31 DIAGNOSIS — N186 End stage renal disease: Secondary | ICD-10-CM | POA: Diagnosis not present

## 2016-07-31 DIAGNOSIS — D509 Iron deficiency anemia, unspecified: Secondary | ICD-10-CM | POA: Diagnosis not present

## 2016-07-31 DIAGNOSIS — N2581 Secondary hyperparathyroidism of renal origin: Secondary | ICD-10-CM | POA: Diagnosis not present

## 2016-07-31 DIAGNOSIS — D631 Anemia in chronic kidney disease: Secondary | ICD-10-CM | POA: Diagnosis not present

## 2016-08-01 ENCOUNTER — Other Ambulatory Visit: Payer: Medicare Other

## 2016-08-01 DIAGNOSIS — B20 Human immunodeficiency virus [HIV] disease: Secondary | ICD-10-CM

## 2016-08-01 LAB — CBC WITH DIFFERENTIAL/PLATELET
BASOS ABS: 52 {cells}/uL (ref 0–200)
Basophils Relative: 1 %
EOS PCT: 1 %
Eosinophils Absolute: 52 cells/uL (ref 15–500)
HCT: 37.5 % — ABNORMAL LOW (ref 38.5–50.0)
Hemoglobin: 11.8 g/dL — ABNORMAL LOW (ref 13.2–17.1)
LYMPHS ABS: 1872 {cells}/uL (ref 850–3900)
Lymphocytes Relative: 36 %
MCH: 34.5 pg — AB (ref 27.0–33.0)
MCHC: 31.5 g/dL — AB (ref 32.0–36.0)
MCV: 109.6 fL — ABNORMAL HIGH (ref 80.0–100.0)
MONOS PCT: 10 %
MPV: 9.6 fL (ref 7.5–12.5)
Monocytes Absolute: 520 cells/uL (ref 200–950)
NEUTROS ABS: 2704 {cells}/uL (ref 1500–7800)
NEUTROS PCT: 52 %
PLATELETS: 191 10*3/uL (ref 140–400)
RBC: 3.42 MIL/uL — AB (ref 4.20–5.80)
RDW: 14.5 % (ref 11.0–15.0)
WBC: 5.2 10*3/uL (ref 3.8–10.8)

## 2016-08-02 ENCOUNTER — Telehealth: Payer: Self-pay | Admitting: *Deleted

## 2016-08-02 DIAGNOSIS — D631 Anemia in chronic kidney disease: Secondary | ICD-10-CM | POA: Diagnosis not present

## 2016-08-02 DIAGNOSIS — N2581 Secondary hyperparathyroidism of renal origin: Secondary | ICD-10-CM | POA: Diagnosis not present

## 2016-08-02 DIAGNOSIS — N186 End stage renal disease: Secondary | ICD-10-CM | POA: Diagnosis not present

## 2016-08-02 DIAGNOSIS — D509 Iron deficiency anemia, unspecified: Secondary | ICD-10-CM | POA: Diagnosis not present

## 2016-08-02 LAB — COMPLETE METABOLIC PANEL WITH GFR
ALT: 10 U/L (ref 9–46)
AST: 13 U/L (ref 10–35)
Albumin: 4.1 g/dL (ref 3.6–5.1)
Alkaline Phosphatase: 46 U/L (ref 40–115)
BUN: 62 mg/dL — AB (ref 7–25)
CHLORIDE: 96 mmol/L — AB (ref 98–110)
CO2: 26 mmol/L (ref 20–31)
Calcium: 9.3 mg/dL (ref 8.6–10.3)
Creat: 12.33 mg/dL — ABNORMAL HIGH (ref 0.70–1.25)
GFR, EST NON AFRICAN AMERICAN: 4 mL/min — AB (ref >=60)
GFR, Est African American: <4 mL/min — ABNORMAL LOW (ref >=60)
GLUCOSE: 98 mg/dL (ref 65–99)
POTASSIUM: 4.7 mmol/L (ref 3.5–5.3)
SODIUM: 139 mmol/L (ref 135–146)
Total Bilirubin: 0.4 mg/dL (ref 0.2–1.2)
Total Protein: 7.7 g/dL (ref 6.1–8.1)

## 2016-08-02 LAB — T-HELPER CELL (CD4) - (RCID CLINIC ONLY)
CD4 T CELL ABS: 470 /uL (ref 400–2700)
CD4 T CELL HELPER: 29 % — AB (ref 33–55)

## 2016-08-02 NOTE — Telephone Encounter (Signed)
Call from [facility] lab to report a critical creatinine of 12.33. Patient has end stage renal disease. Onnie Boer, pharmacist notified and phone note routed to Carlyle Basques, MD. Result also place in MD inbox. Bryan Gilmore

## 2016-08-04 LAB — HIV-1 RNA QUANT-NO REFLEX-BLD
HIV 1 RNA Quant: 70 copies/mL — ABNORMAL HIGH (ref <20)
HIV-1 RNA QUANT, LOG: 1.85 {Log_copies}/mL — AB (ref <1.30)

## 2016-08-05 DIAGNOSIS — D631 Anemia in chronic kidney disease: Secondary | ICD-10-CM | POA: Diagnosis not present

## 2016-08-05 DIAGNOSIS — D509 Iron deficiency anemia, unspecified: Secondary | ICD-10-CM | POA: Diagnosis not present

## 2016-08-05 DIAGNOSIS — N2581 Secondary hyperparathyroidism of renal origin: Secondary | ICD-10-CM | POA: Diagnosis not present

## 2016-08-05 DIAGNOSIS — N186 End stage renal disease: Secondary | ICD-10-CM | POA: Diagnosis not present

## 2016-08-08 ENCOUNTER — Other Ambulatory Visit: Payer: Self-pay | Admitting: Internal Medicine

## 2016-08-08 DIAGNOSIS — D509 Iron deficiency anemia, unspecified: Secondary | ICD-10-CM | POA: Diagnosis not present

## 2016-08-08 DIAGNOSIS — N2581 Secondary hyperparathyroidism of renal origin: Secondary | ICD-10-CM | POA: Diagnosis not present

## 2016-08-08 DIAGNOSIS — B20 Human immunodeficiency virus [HIV] disease: Secondary | ICD-10-CM

## 2016-08-08 DIAGNOSIS — N186 End stage renal disease: Secondary | ICD-10-CM | POA: Diagnosis not present

## 2016-08-08 DIAGNOSIS — D631 Anemia in chronic kidney disease: Secondary | ICD-10-CM | POA: Diagnosis not present

## 2016-08-09 ENCOUNTER — Ambulatory Visit (INDEPENDENT_AMBULATORY_CARE_PROVIDER_SITE_OTHER): Payer: Medicare Other | Admitting: Internal Medicine

## 2016-08-09 ENCOUNTER — Ambulatory Visit (INDEPENDENT_AMBULATORY_CARE_PROVIDER_SITE_OTHER): Payer: Medicare Other

## 2016-08-09 ENCOUNTER — Encounter: Payer: Self-pay | Admitting: Internal Medicine

## 2016-08-09 VITALS — BP 118/68 | HR 90 | Temp 98.1°F | Ht 71.0 in | Wt 243.4 lb

## 2016-08-09 DIAGNOSIS — B20 Human immunodeficiency virus [HIV] disease: Secondary | ICD-10-CM

## 2016-08-09 DIAGNOSIS — R1084 Generalized abdominal pain: Secondary | ICD-10-CM

## 2016-08-09 DIAGNOSIS — L03032 Cellulitis of left toe: Secondary | ICD-10-CM

## 2016-08-09 DIAGNOSIS — D631 Anemia in chronic kidney disease: Secondary | ICD-10-CM

## 2016-08-09 DIAGNOSIS — G6289 Other specified polyneuropathies: Secondary | ICD-10-CM | POA: Diagnosis not present

## 2016-08-09 DIAGNOSIS — Z0000 Encounter for general adult medical examination without abnormal findings: Secondary | ICD-10-CM | POA: Diagnosis not present

## 2016-08-09 DIAGNOSIS — R195 Other fecal abnormalities: Secondary | ICD-10-CM | POA: Diagnosis not present

## 2016-08-09 DIAGNOSIS — E034 Atrophy of thyroid (acquired): Secondary | ICD-10-CM | POA: Diagnosis not present

## 2016-08-09 DIAGNOSIS — N186 End stage renal disease: Secondary | ICD-10-CM

## 2016-08-09 DIAGNOSIS — Z992 Dependence on renal dialysis: Secondary | ICD-10-CM | POA: Diagnosis not present

## 2016-08-09 DIAGNOSIS — N185 Chronic kidney disease, stage 5: Secondary | ICD-10-CM | POA: Diagnosis not present

## 2016-08-09 NOTE — Progress Notes (Signed)
Quick Notes   Health Maintenance:   Due for Colonscopy- Pt to call and schedule w/ [facility]   Abnormal Screen:   None; MMSE-29/30 passed Clock test   Patient Concerns:   Still having Black stools and pain in umbilicus (previous hernia)    Nurse Concerns:   None

## 2016-08-09 NOTE — Progress Notes (Signed)
Subjective:   Bryan Gilmore. is a 65 y.o. male who presents for an Initial Medicare Annual Wellness Visit.  Review of Systems  Cardiac Risk Factors include: advanced age (>21men, >46 women);family history of premature cardiovascular disease;hypertension;male gender;obesity (BMI >30kg/m2);sedentary lifestyle    Objective:    Today's Vitals   08/09/16 1005  BP: 118/68  Pulse: 90  Temp: 98.1 F (36.7 C)  TempSrc: Oral  SpO2: 97%  Weight: 243 lb 6.4 oz (110.4 kg)  Height: 5\' 11"  (1.803 m)  PainSc: 4    Body mass index is 33.95 kg/m.  Current Medications (verified) Outpatient Encounter Prescriptions as of 08/09/2016  Medication Sig  . allopurinol (ZYLOPRIM) 300 MG tablet TAKE ONE TABLET BY MOUTH ONCE DAILY FOR GOUT  . carvedilol (COREG) 3.125 MG tablet TAKE 1 TABLET BY MOUTH 2 TIMES DAILY WITH A MEAL  . cinacalcet (SENSIPAR) 30 MG tablet Take 30 mg by mouth daily.  . dolutegravir (TIVICAY) 50 MG tablet Take 1 tablet (50 mg total) by mouth daily.  . Ferric Citrate (AURYXIA) 1 GM 210 MG(Fe) TABS Take 2 tablets by mouth 3 (three) times daily with meals.  . gabapentin (NEURONTIN) 300 MG capsule Take 1 cap po BID and 2 caps po qhs  . ibuprofen (ADVIL,MOTRIN) 800 MG tablet Take 800 mg by mouth every 8 (eight) hours as needed.  . lamivudine (EPIVIR) 100 MG tablet TAKE 1/2 TABLET BY MOUTH EVERY DAY  . levothyroxine (SYNTHROID, LEVOTHROID) 125 MCG tablet TAKE 1 TABLET(125 MCG) BY MOUTH DAILY BEFORE BREAKFAST  . losartan (COZAAR) 50 MG tablet Take 1 tablet by mouth daily.  Marland Kitchen nystatin (MYCOSTATIN/NYSTOP) powder Apply topically 2 (two) times daily.  Marland Kitchen PREZCOBIX 800-150 MG tablet TAKE 1 TABLET BY MOUTH EVERY DAY WITH FOOD  . zidovudine (RETROVIR) 300 MG tablet TAKE 1 TABLET BY MOUTH EVERY DAY  . [DISCONTINUED] lanthanum (FOSRENOL) 1000 MG chewable tablet Chew 1,000 mg by mouth 3 (three) times daily with meals.   . [DISCONTINUED] sulfamethoxazole-trimethoprim (BACTRIM DS,SEPTRA DS) 800-160  MG tablet One twice daily to treat infection   No facility-administered encounter medications on file as of 08/09/2016.     Allergies (verified) Patient has no known allergies.   History: Past Medical History:  Diagnosis Date  . Acute respiratory failure ([place])   . Alcohol abuse   . Altered mental status   . Anemia   . Anemia in chronic kidney disease(285.21)   . Arthritis    "joints" (10/12/2014)  . Atrial fibrillation ([place])   . Colostomy status ([place])   . Cough   . Disorders of phosphorus metabolism   . Dysrhythmia    AFib, not a candidate for coumadin , dr [name] signed off on pt  . End stage renal disease ([place])   . ESRD (end stage renal disease) on dialysis ([place])    "TTS; [facility] " (10/12/2014)  . Fournier's gangrene   . Gangrene ([place])   . GERD (gastroesophageal reflux disease)   . Gout   . Gouty arthritis   . Hemoptysis, unspecified   . Hepatitis A   . Hepatitis B   . Hepatitis C    "I've been treated for all of them" (10/12/2014)  . HIV (human immunodeficiency virus infection) ([place])   . Hypertension   . Hypopotassemia   . Hypothyroidism   . Myocardial infarction    "told by a dr I had evidence of one that I didn't know about" (10/12/2014)  . Other severe protein-calorie malnutrition   .  Peripheral vascular disease, unspecified    denies  . Sebaceous cyst   . Sepsis(995.91)   . Sepsis(995.91)   . Severe protein-calorie malnutrition ([place])   . Ulcer of lower limb, unspecified   . Ulcer of lower limb, unspecified   . Unspecified constipation    Past Surgical History:  Procedure Laterality Date  . AV FISTULA PLACEMENT  04/12/2012   Procedure: INSERTION OF ARTERIOVENOUS (AV) GORE-TEX GRAFT ARM;  Surgeon: Rosetta Posner, MD;  Location: [facility] OR;  Service: Vascular;  Laterality: Left;  insertion of 6x50 stretch goretex graft   . AV FISTULA PLACEMENT  08/14/2012   Procedure: INSERTION OF ARTERIOVENOUS (AV) GORE-TEX GRAFT ARM;  Surgeon: Mal Misty, MD;   Location: [place];  Service: Vascular;  Laterality: Left;  . AV FISTULA PLACEMENT Right 11/08/2012   Procedure: ARTERIOVENOUS (AV) FISTULA CREATION;  Surgeon: Angelia Mould, MD;  Location: [place];  Service: Vascular;  Laterality: Right;  . BOWEL RESECTION N/A 02/21/2013   Procedure: SMALL BOWEL RESECTION;  Surgeon: [name], MD;  Location: [place];  Service: General;  Laterality: N/A;  . COLOSTOMY  02/2012   d/t an infection  . COLOSTOMY  13  . COLOSTOMY CLOSURE N/A 02/21/2013   Procedure: COLOSTOMY CLOSURE;  Surgeon: [name], MD;  Location: [place];  Service: General;  Laterality: N/A;  takedown of sigmoid colostomy with anastomosis  . CYSTOSCOPY  07/30/2012   Procedure: CYSTOSCOPY;  Surgeon: Reece Packer, MD;  Location: [facility];  Service: Urology;  Laterality: Bilateral;  retrograde  . CYSTOSCOPY WITH URETHRAL DILATATION  02/17/2012   Procedure: CYSTOSCOPY WITH URETHRAL DILATATION;  Surgeon: Reece Packer, MD;  Location: [facility];  Service: Urology;  Laterality: N/A;  retrograde urethragram, placement of foley catheter exam under anesthesia   . FISTULOGRAM Right 01/06/2013   Procedure: FISTULOGRAM;  Surgeon: Angelia Mould, MD;  Location[facility] C[facility] LAB;  Service: Cardiovascular;  Laterality: Right;  . HERNIA REPAIR    . INSERTION OF DIALYSIS CATHETER  02/20/2012   Procedure: INSERTION OF DIALYSIS CATHETER;  Surgeon: Angelia Mould, MD;  Location: [place];  Service: Vascular;  Laterality: N/A;  . INSERTION OF DIALYSIS CATHETER  03/29/2012   Procedure: INSERTION OF DIALYSIS CATHETER;  Surgeon: Rosetta Posner, MD;  Location: [facility] OR;  Service: Vascular;  Laterality: N/A;  Insertion tunneled dialysis catheter right IJ  . INSERTION OF DIALYSIS CATHETER  08/04/2012   Procedure: INSERTION OF DIALYSIS CATHETER;  Surgeon: Angelia Mould, MD;  Location: [place];  Service: Vascular;  Laterality: Right;  right intrajugular hemodialysis catheter insertion  . INSERTION  OF MESH N/A 10/12/2014   Procedure: INSERTION OF MESH;  Surgeon: Excell Seltzer, MD;  Location: [place];  Service: General;  Laterality: N/A;  . IRRIGATION AND DEBRIDEMENT ABSCESS  02/29/2012   Procedure: IRRIGATION AND DEBRIDEMENT ABSCESS;  Surgeon: Reece Packer, MD;  Location: [place];  Service: Urology;  Laterality: N/A;  I&D of scrotum abcess  . Surgery for Fournier's gangrene  02/2012  . THROMBECTOMY AND REVISION OF ARTERIOVENTOUS (AV) GORETEX  GRAFT  07/15/2012   Procedure: THROMBECTOMY AND REVISION OF ARTERIOVENTOUS (AV) GORETEX  GRAFT;  Surgeon: Elam Dutch, MD;  Location: [place];  Service: Vascular;  Laterality: Left;  . URETHROGRAM  07/30/2012   Procedure: URETHROGRAM;  Surgeon: Reece Packer, MD;  Location: [facility];  Service: Urology;  Laterality: N/A;  cystogram  . VENTRAL HERNIA REPAIR  10/12/2014  . VENTRAL HERNIA REPAIR  N/A 10/12/2014   Procedure: REPAIR VENTRAL INCISIONAL HERNIA;  Surgeon: Excell Seltzer, MD;  Location: [facility] OR;  Service: General;  Laterality: N/A;   Family History  Problem Relation Age of Onset  . Hypertension Mother   . Hypertension Father   . Cancer Cousin     breast   Social History   Occupational History  . [employer]   Social History Main Topics  . Smoking status: Never Smoker  . Smokeless tobacco: Never Used  . Alcohol use No  . Drug use: No  . Sexual activity: No     Comment: declined condoms   Tobacco Counseling Counseling given: No   Activities of Daily Living In your present state of health, do you have any difficulty performing the following activities: 08/09/2016  Hearing? N  Vision? Y  Difficulty concentrating or making decisions? N  Walking or climbing stairs? N  Dressing or bathing? N  Doing errands, shopping? Y  Preparing Food and eating ? N  Using the Toilet? N  In the past six months, have you accidently leaked urine? Y  Do you have problems with loss of bowel control? N  Managing your  Medications? N  Managing your Finances? N  Housekeeping or managing your Housekeeping? N  Some recent data might be hidden    Immunizations and Health Maintenance Immunization History  Administered Date(s) Administered  . Hepatitis B 11/27/2012, 01/15/2013, 01/15/2013  . Hepatitis B, adult 06/11/2013, 06/11/2013  . Influenza Split 06/04/2012  . Influenza,inj,Quad PF,36+ Mos 06/06/2013, 05/25/2014, 06/30/2015, 05/10/2016  . Pneumococcal Conjugate-13 08/25/2015  . Pneumococcal Polysaccharide-23 05/12/2012  . Tdap 09/12/2011  . Zoster 04/23/2015   There are no preventive care reminders to display for this patient.  Patient Care Team: Gildardo Cranker, DO as PCP - General (Internal Medicine) Thayer Headings, MD as PCP - Infectious Diseases (Infectious Diseases) Minda Ditto, RT as Technician Corliss Parish, MD as Consulting Physician (Nephrology)  Indicate any recent Medical Services you may have received from other than [facility] providers in the past year (date may be approximate).    Assessment:   This is a routine wellness examination for Alexander.  Hearing/Vision screen  Visual Acuity Screening   Right eye Left eye Both eyes  Without correction: 20/30-1 20/30-1 20/40  With correction:     Comments: Last eye exam done over a year ago w/ Dr. Gershon Crane.   Hearing Screening Comments: Pt states he has a hearing screen appointment in Dec. 2017.   Dietary issues and exercise activities discussed: Current Exercise Habits: Structured exercise class, Type of exercise: strength training/weights, Time (Minutes): 30, Frequency (Times/Week): 3, Weekly Exercise (Minutes/Week): 90, Intensity: Mild, Exercise limited by: None identified  Goals    . Increase physical activity          Starting 08/09/16, I will attempt to increase my physical activity daily.       Depression Screen PHQ 2/9 Scores 08/09/2016 05/11/2016 03/03/2016 02/23/2016  PHQ - 2 Score 0 0 0 0    Fall Risk Fall Risk   08/09/2016 05/11/2016 03/03/2016 02/23/2016 01/05/2016  Falls in the past year? No No No No No  Number falls in past yr: - - - - -  Injury with Fall? - - - - -  Risk for fall due to : - - - - -    Cognitive Function: MMSE - Mini Mental State Exam 08/09/2016 10/15/2013  Orientation to time 5 5  Orientation to Place 5 5  Registration 3 3  Attention/ Calculation 5 5  Recall 3 3  Language- name 2 objects 2 2  Language- repeat 1 1  Language- follow 3 step command 2 3  Language- read & follow direction 1 1  Write a sentence 1 1  Copy design 1 1  Total score 29 30        Screening Tests Health Maintenance  Topic Date Due  . COLONOSCOPY  08/09/2018 (Originally 11/18/2000)  . PNA vac Low Risk Adult (2 of 2 - PPSV23) 05/12/2017  . TETANUS/TDAP  09/11/2021  . INFLUENZA VACCINE  Completed  . ZOSTAVAX  Completed  . Hepatitis C Screening  Completed  . HIV Screening  Completed        Plan:  I have personally reviewed and addressed the Medicare Annual Wellness questionnaire and have noted the following in the patient's chart:  A. Medical and social history B. Use of alcohol, tobacco or illicit drugs  C. Current medications and supplements D. Functional ability and status E.  Nutritional status F.  Physical activity G. Advance directives H. List of other physicians I.  Hospitalizations, surgeries, and ER visits in previous 12 months J.  [employer] to include hearing, vision, cognitive, depression L. Referrals and appointments - none  In addition, I have reviewed and discussed with patient certain preventive protocols, quality metrics, and best practice recommendations. A written personalized care plan for preventive services as well as general preventive health recommendations were provided to patient.  See attached scanned questionnaire for additional information.   Signed,   Allyn Kenner, [place]. Perlie Gold  [facility] [address] [place], [place] [address] [phone] Cell (Monday-Friday 8 AM - 5 PM) [phone] After 5 PM and follow prompts

## 2016-08-09 NOTE — Patient Instructions (Addendum)
Bryan Gilmore , Thank you for taking time to come for your Medicare Wellness Visit. I appreciate your ongoing commitment to your health goals. Please review the following plan we discussed and let me know if I can assist you in the future.   These are the goals we discussed: Goals    . Increase physical activity          Starting 08/09/16, I will attempt to increase my physical activity daily.        This is a list of the screening recommended for you and due dates:  Health Maintenance  Topic Date Due  . Colon Cancer Screening  08/09/2018*  . Pneumonia vaccines (2 of 2 - PPSV23) 05/12/2017  . Tetanus Vaccine  09/11/2021  . Flu Shot  Completed  . Shingles Vaccine  Completed  .  Hepatitis C: One time screening is recommended by Center for Disease Control  (CDC) for  adults born from [birth year] through 1965.   Completed  . HIV Screening  Completed  *Topic was postponed. The date shown is not the original due date.    Bryan Gilmore , Thank you for taking time to come for your Medicare Wellness Visit. I appreciate your ongoing commitment to your health goals. Please review the following plan we discussed and let me know if I can assist you in the future.   These are the goals we discussed: Goals    . Increase physical activity          Starting 08/09/16, I will attempt to increase my physical activity daily.        This is a list of the screening recommended for you and due dates:  Health Maintenance  Topic Date Due  . Colon Cancer Screening  08/09/2018*  . Pneumonia vaccines (2 of 2 - PPSV23) 05/12/2017  . Tetanus Vaccine  09/11/2021  . Flu Shot  Completed  . Shingles Vaccine  Completed  .  Hepatitis C: One time screening is recommended by Center for Disease Control  (CDC) for  adults born from [birth year] through 1965.   Completed  . HIV Screening  Completed  *Topic was postponed. The date shown is not the original due date.   Preventive Care for Adults  A healthy lifestyle and preventive  care can promote health and wellness. Preventive health guidelines for adults include the following key practices.  . A routine yearly physical is a good way to check with your health care provider about your health and preventive screening. It is a chance to share any concerns and updates on your health and to receive a thorough exam.  . Visit your dentist for a routine exam and preventive care every 6 months. Brush your teeth twice a day and floss once a day. Good oral hygiene prevents tooth decay and gum disease.  . The frequency of eye exams is based on your age, health, family medical history, use  of contact lenses, and other factors. Follow your health care provider's ecommendations for frequency of eye exams.  . Eat a healthy diet. Foods like vegetables, fruits, whole grains, low-fat dairy products, and lean protein foods contain the nutrients you need without too many calories. Decrease your intake of foods high in solid fats, added sugars, and salt. Eat the right amount of calories for you. Get information about a proper diet from your health care provider, if necessary.  . Regular physical exercise is one of the most important things you can do for your health.  Most adults should get at least 150 minutes of moderate-intensity exercise (any activity that increases your heart rate and causes you to sweat) each week. In addition, most adults need muscle-strengthening exercises on 2 or more days a week.  Silver Sneakers may be a benefit available to you. To determine eligibility, you may visit the website: www.silversneakers.com or contact program at 856 721 0388 Mon-Fri between 8AM-8PM.   . Maintain a healthy weight. The body mass index (BMI) is a screening tool to identify possible weight problems. It provides an estimate of body fat based on height and weight. Your health care provider can find your BMI and can help you achieve or maintain a healthy weight.   For adults 20 years and  older: ? A BMI below 18.5 is considered underweight. ? A BMI of 18.5 to 24.9 is normal. ? A BMI of 25 to 29.9 is considered overweight. ? A BMI of 30 and above is considered obese.   . Maintain normal blood lipids and cholesterol levels by exercising and minimizing your intake of saturated fat. Eat a balanced diet with plenty of fruit and vegetables. Blood tests for lipids and cholesterol should begin at age 33 and be repeated every 5 years. If your lipid or cholesterol levels are high, you are over 50, or you are at high risk for heart disease, you may need your cholesterol levels checked more frequently. Ongoing high lipid and cholesterol levels should be treated with medicines if diet and exercise are not working.  . If you smoke, find out from your health care provider how to quit. If you do not use tobacco, please do not start.  . If you choose to drink alcohol, please do not consume more than 2 drinks per day. One drink is considered to be 12 ounces (355 mL) of beer, 5 ounces (148 mL) of wine, or 1.5 ounces (44 mL) of liquor.  . If you are 24-59 years old, ask your health care provider if you should take aspirin to prevent strokes.  . Use sunscreen. Apply sunscreen liberally and repeatedly throughout the day. You should seek shade when your shadow is shorter than you. Protect yourself by wearing long sleeves, pants, a wide-brimmed hat, and sunglasses year round, whenever you are outdoors.  . Once a month, do a whole body skin exam, using a mirror to look at the skin on your back. Tell your health care provider of new moles, moles that have irregular borders, moles that are larger than a pencil eraser, or moles that have changed in shape or color.

## 2016-08-09 NOTE — Patient Instructions (Addendum)
Encouraged pt to exercise 30-45 minutes 4-5 times per week. Eat a well balanced diet. Avoid smoking. Limit alcohol intake. Wear seatbelt when riding in the car. Wear sun block (SPF >50) when spending extended times outside.  Will call with referral appt to see GI Dr Paulita Fujita  Continue current medications as ordered  Follow up with specialists as scheduled  Follow up in 3 mos for routine visit

## 2016-08-09 NOTE — Progress Notes (Addendum)
Patient ID: [name]., male   DOB: 04-04-1951, 64 y.o.   MRN: [id number]   Location:  [facility]  Place of Service:  OFFICE  Provider: Arletha Grippe, DO  Patient Care Team: Gildardo Cranker, DO as PCP - General (Internal Medicine) Thayer Headings, MD as PCP - Infectious Diseases (Infectious Diseases) Minda Ditto, RT as Technician Corliss Parish, MD as Consulting Physician (Nephrology)  Extended Emergency Contact Information Primary Emergency Contact: [facility] Address: [address] [place], [place] [address] Johnnette Litter of [place] Phone: [phone] Relation: Nephew Secondary Emergency Contact: Weatherman-Russell,Doris [place] Phone: [phone] Mobile Phone: [phone] Relation: Sister  Code Status:  Goals of Care: Advanced Directive information Advanced Directives 08/09/2016  Does Patient Have a Medical Advance Directive? No  Type of Advance Directive -  Does patient want to make changes to medical advance directive? -  Copy of [place] in Chart? -  Would patient like information on creating a medical advance directive? Yes (MAU/Ambulatory/Procedural Areas - Information given)  Pre-existing out of facility DNR order (yellow form or pink MOST form) -     Chief Complaint  Patient presents with  . Medicare Wellness    Yearly exam  . Other    MMSE 29/30 passed clock drawing    HPI: Patient is a 65 y.o. male seen in today for an annual wellness exam. he c/o 6 week hx intermittent black stools. He told HD and he was given hemoccult cards which he states were neg. No BRBPR. Last colonoscopy in 2014. He has hx colonic sx with colostomy that was reversed. He also has seasonal allergy sx's. He reports abdominal pain at surgical scars.  skin rash - stable. He never was able to pick up clotrimazole-betamethasone cream due to cost.  He continues to itch. He is out of nystatin powder.  afib - rate  controlled. Currently taking coreg  HTN - stable on losartan. Hydralazine stopped by nephro  CP/hx MI - had nuclear stress test in Dec 2016 that showed fixed defect apical and inferior wall c/w prior infarct; EF 48%. He no longer sees cardio  ESRD/HD - TThSa via right forearm AVF; no issues. Takes sensipar and ferric citrate. No longer on fosrenal  Gout - no attacks since his last visit. Takes allopurinol  Joint pain/neuropathy - takes ibuprofen prn and gabapentin  HIV - followed by Dr Baxter Flattery. CD4 470. Viral load quant 70; log 1.85. He is on 4 HAART meds. no longer on isentress and takes tivicay instead  Hypothyroidism - no cold intolerance/palpitations. He takes levothyroxine. TSH 0.76  Anemia of chronic disease - Hgb 11.8. Stable on ferric citrate    Depression screen [facility] 2/9 08/09/2016 05/11/2016 03/03/2016 02/23/2016 06/30/2015  Decreased Interest 0 0 0 0 0  Down, Depressed, Hopeless 0 0 0 0 0  PHQ - 2 Score 0 0 0 0 0  Some recent data might be hidden    Fall Risk  08/09/2016 05/11/2016 03/03/2016 02/23/2016 01/05/2016  Falls in the past year? No No No No No  Number falls in past yr: - - - - -  Injury with Fall? - - - - -  Risk for fall due to : - - - - -   MMSE - [place] Exam 08/09/2016 10/15/2013  Orientation to time 5 5  Orientation to Place 5 5  Registration 3 3  Attention/ Calculation 5 5  Recall 3 3  Language- name 2 objects 2 2  Language- repeat 1 1  Language- follow 3 step command 2 3  Language- read & follow direction 1 1  Write a sentence 1 1  Copy design 1 1  Total score 29 30     Health Maintenance  Topic Date Due  . COLONOSCOPY  08/09/2018 (Originally 11/18/2000)  . PNA vac Low Risk Adult (2 of 2 - PPSV23) 05/12/2017  . TETANUS/TDAP  09/11/2021  . INFLUENZA VACCINE  Completed  . ZOSTAVAX  Completed  . Hepatitis C Screening  Completed  . HIV Screening  Completed    Urinary incontinence? No concerns     Exercise? As tolerated  Diet?  Attempts healthy foods  No exam data present  Hearing: decreased    Dentition: poor.  Pain: stable  Past Medical History:  Diagnosis Date  . Acute respiratory failure ([place])   . Alcohol abuse   . Altered mental status   . Anemia   . Anemia in chronic kidney disease(285.21)   . Arthritis    "joints" (10/12/2014)  . Atrial fibrillation ([place])   . Colostomy status ([place])   . Cough   . Disorders of phosphorus metabolism   . Dysrhythmia    AFib, not a candidate for coumadin , dr [name] signed off on pt  . End stage renal disease ([place])   . ESRD (end stage renal disease) on dialysis ([place])    "TTS; [facility] " (10/12/2014)  . Fournier's gangrene   . Gangrene ([place])   . GERD (gastroesophageal reflux disease)   . Gout   . Gouty arthritis   . Hemoptysis, unspecified   . Hepatitis A   . Hepatitis B   . Hepatitis C    "I've been treated for all of them" (10/12/2014)  . HIV (human immunodeficiency virus infection) ([place])   . Hypertension   . Hypopotassemia   . Hypothyroidism   . Myocardial infarction    "told by a dr I had evidence of one that I didn't know about" (10/12/2014)  . Other severe protein-calorie malnutrition   . Peripheral vascular disease, unspecified    denies  . Sebaceous cyst   . Sepsis(995.91)   . Sepsis(995.91)   . Severe protein-calorie malnutrition ([place])   . Ulcer of lower limb, unspecified   . Ulcer of lower limb, unspecified   . Unspecified constipation     Past Surgical History:  Procedure Laterality Date  . AV FISTULA PLACEMENT  04/12/2012   Procedure: INSERTION OF ARTERIOVENOUS (AV) GORE-TEX GRAFT ARM;  Surgeon: Rosetta Posner, MD;  Location: [facility] OR;  Service: Vascular;  Laterality: Left;  insertion of 6x50 stretch goretex graft   . AV FISTULA PLACEMENT  08/14/2012   Procedure: INSERTION OF ARTERIOVENOUS (AV) GORE-TEX GRAFT ARM;  Surgeon: Mal Misty, MD;  Location: [place];  Service: Vascular;  Laterality: Left;  . AV FISTULA PLACEMENT Right  11/08/2012   Procedure: ARTERIOVENOUS (AV) FISTULA CREATION;  Surgeon: Angelia Mould, MD;  Location: [place];  Service: Vascular;  Laterality: Right;  . BOWEL RESECTION N/A 02/21/2013   Procedure: SMALL BOWEL RESECTION;  Surgeon: [name], MD;  Location: [place];  Service: General;  Laterality: N/A;  . COLOSTOMY  02/2012   d/t an infection  . COLOSTOMY  13  . COLOSTOMY CLOSURE N/A 02/21/2013   Procedure: COLOSTOMY CLOSURE;  Surgeon: [name], MD;  Location: [place];  Service: General;  Laterality: N/A;  takedown  of sigmoid colostomy with anastomosis  . CYSTOSCOPY  07/30/2012   Procedure: CYSTOSCOPY;  Surgeon: Reece Packer, MD;  Location: [facility];  Service: Urology;  Laterality: Bilateral;  retrograde  . CYSTOSCOPY WITH URETHRAL DILATATION  02/17/2012   Procedure: CYSTOSCOPY WITH URETHRAL DILATATION;  Surgeon: Reece Packer, MD;  Location: [facility];  Service: Urology;  Laterality: N/A;  retrograde urethragram, placement of foley catheter exam under anesthesia   . FISTULOGRAM Right 01/06/2013   Procedure: FISTULOGRAM;  Surgeon: Angelia Mould, MD;  Location: [facility] CATH LAB;  Service: Cardiovascular;  Laterality: Right;  . HERNIA REPAIR    . INSERTION OF DIALYSIS CATHETER  02/20/2012   Procedure: INSERTION OF DIALYSIS CATHETER;  Surgeon: Angelia Mould, MD;  Location: [place];  Service: Vascular;  Laterality: N/A;  . INSERTION OF DIALYSIS CATHETER  03/29/2012   Procedure: INSERTION OF DIALYSIS CATHETER;  Surgeon: Rosetta Posner, MD;  [facility];  Service: Vascular;  Laterality: N/A;  Insertion tunneled dialysis catheter right IJ  . INSERTION OF DIALYSIS CATHETER  08/04/2012   Procedure: INSERTION OF DIALYSIS CATHETER;  Surgeon: Angelia Mould, MD;  Location: [place];  Service: Vascular;  Laterality: Right;  right intrajugular hemodialysis catheter insertion  . INSERTION OF MESH N/A 10/12/2014   Procedure: INSERTION OF MESH;  Surgeon: Excell Seltzer, MD;   Location: [place];  Service: General;  Laterality: N/A;  . IRRIGATION AND DEBRIDEMENT ABSCESS  02/29/2012   Procedure: IRRIGATION AND DEBRIDEMENT ABSCESS;  Surgeon: Reece Packer, MD;  Location: [place];  Service: Urology;  Laterality: N/A;  I&D of scrotum abcess  . Surgery for Fournier's gangrene  02/2012  . THROMBECTOMY AND REVISION OF ARTERIOVENTOUS (AV) GORETEX  GRAFT  07/15/2012   Procedure: THROMBECTOMY AND REVISION OF ARTERIOVENTOUS (AV) GORETEX  GRAFT;  Surgeon: Elam Dutch, MD;  Location: [place];  Service: Vascular;  Laterality: Left;  . URETHROGRAM  07/30/2012   Procedure: URETHROGRAM;  Surgeon: Reece Packer, MD;  [facility];  Service: Urology;  Laterality: N/A;  cystogram  . VENTRAL HERNIA REPAIR  10/12/2014  . VENTRAL HERNIA REPAIR N/A 10/12/2014   Procedure: REPAIR VENTRAL INCISIONAL HERNIA;  Surgeon: Excell Seltzer, MD;  Location: [facility];  Service: General;  Laterality: N/A;    Family History  Problem Relation Age of Onset  . Hypertension Mother   . Hypertension Father   . Cancer Cousin     breast   Family Status  Relation Status  . Mother Deceased   Cause of Death: Gunshot wound  . Father Deceased   Cause of Death: Natural causes  . Sister Alive  . Brother Alive  . Daughter Alive  . Sister Alive  . Sister Alive  . Sister Alive  . Sister Alive  . Daughter Alive  . Cousin Deceased    shoulder  Social History   Social History  . Marital status: Legally Separated    Spouse name: N/A  . Number of children: 0  . Years of education: N/A   Occupational History  . [employer]   Social History Main Topics  . Smoking status: Never Smoker  . Smokeless tobacco: Never Used  . Alcohol use No  . Drug use: No  . Sexual activity: No     Comment: declined condoms   Other Topics Concern  . Not on file   Social History Narrative   Single.  Unemployed.  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      No Known Allergies      Medication List       Accurate as of 08/09/16 11:48 AM. Always use your most recent med list.          allopurinol 300 MG tablet Commonly known as:  ZYLOPRIM TAKE ONE TABLET BY MOUTH ONCE DAILY FOR GOUT   AURYXIA 1 GM 210 MG(Fe) Tabs Generic drug:  Ferric Citrate Take 2 tablets by mouth 3 (three) times daily with meals.   carvedilol 3.125 MG tablet Commonly known as:  COREG TAKE 1 TABLET BY MOUTH 2 TIMES DAILY WITH A MEAL   dolutegravir 50 MG tablet Commonly known as:  TIVICAY Take 1 tablet (50 mg total) by mouth daily.   gabapentin 300 MG capsule Commonly known as:  NEURONTIN Take 1 cap po BID and 2 caps po qhs   ibuprofen 800 MG tablet Commonly known as:  ADVIL,MOTRIN Take 800 mg by mouth every 8 (eight) hours as needed.   lamivudine 100 MG tablet Commonly known as:  EPIVIR TAKE 1/2 TABLET BY MOUTH EVERY DAY   levothyroxine 125 MCG tablet Commonly known as:  SYNTHROID, LEVOTHROID TAKE 1 TABLET(125 MCG) BY MOUTH DAILY BEFORE BREAKFAST   losartan 50 MG tablet Commonly known as:  COZAAR Take 1 tablet by mouth daily.   nystatin powder Commonly known as:  MYCOSTATIN/NYSTOP Apply topically 2 (two) times daily.   PREZCOBIX 800-150 MG tablet Generic drug:  darunavir-cobicistat TAKE 1 TABLET BY MOUTH EVERY DAY WITH FOOD   SENSIPAR 30 MG tablet Generic drug:  cinacalcet Take 30 mg by mouth daily.   zidovudine 300 MG tablet Commonly known as:  RETROVIR TAKE 1 TABLET BY MOUTH EVERY DAY        Review of Systems:  Review of Systems  HENT: Positive for hearing loss.   Gastrointestinal: Positive for abdominal pain.  Musculoskeletal: Positive for arthralgias.  Skin: Positive for rash.  All other systems reviewed and are negative.   Physical Exam: Vitals:   08/09/16 1105  BP: 118/68  Pulse: 90  Temp: 98.1 F (36.7 C)  TempSrc: Oral  SpO2: 97%  Weight: 243 lb 6.4 oz (110.4 kg)  Height: 5\' 11"  (1.803 m)   Body mass index is 33.95 kg/m. Physical  Exam  Constitutional: He is oriented to person, place, and time. He appears well-developed and well-nourished. No distress.  HENT:  Head: Normocephalic and atraumatic.  Right Ear: Hearing, tympanic membrane, external ear and ear canal normal.  Left Ear: Hearing, tympanic membrane, external ear and ear canal normal.  Mouth/Throat: Uvula is midline, oropharynx is clear and moist and mucous membranes are normal. He does not have dentures. No oropharyngeal exudate.  TMs intact; no cerumen impaction. No redness or d/c  Eyes: Conjunctivae, EOM and lids are normal. Pupils are equal, round, and reactive to light. No scleral icterus.  Neck: Trachea normal and normal range of motion. Neck supple. Carotid bruit is not present. No thyroid mass and no thyromegaly present.  Cardiovascular: Normal rate and intact distal pulses.  An irregularly irregular rhythm present. Exam reveals no gallop and no friction rub.   Murmur heard.  Systolic murmur is present with a grade of 1/6  no distal LE swelling. No calf TTP. Right forearm AVF palpable thrill/audible bruit  Pulmonary/Chest: Effort normal and breath sounds normal. He has no wheezes. He has no rhonchi. He has no rales. Right breast exhibits no inverted nipple, no mass, no nipple discharge, no skin change  and no tenderness. Left breast exhibits no inverted nipple, no mass, no nipple discharge, no skin change and no tenderness. Breasts are symmetrical.  Abdominal: Soft. Normal appearance, normal aorta and bowel sounds are normal. He exhibits distension. He exhibits no abdominal bruit, no pulsatile midline mass and no mass. There is no hepatosplenomegaly. There is tenderness (along midline surgical incisional scar). There is no rigidity, no rebound and no guarding. No hernia.  Genitourinary: Prostate normal and penis normal. Rectal exam shows guaiac positive stool. No penile tenderness.  Genitourinary Comments: Chaperone present  Musculoskeletal: Normal range of  motion. He exhibits edema and tenderness.  Lymphadenopathy:       Head (right side): No posterior auricular adenopathy present.       Head (left side): No posterior auricular adenopathy present.    He has no cervical adenopathy.    He has no axillary adenopathy.       Right axillary: No pectoral and no lateral adenopathy present.       Left axillary: No pectoral adenopathy present.      Right: No supraclavicular adenopathy present.       Left: No supraclavicular adenopathy present.  Neurological: He is alert and oriented to person, place, and time. He has normal strength and normal reflexes. No cranial nerve deficit. Gait normal.  Skin: Skin is warm, dry and intact. Rash noted. Nails show no clubbing.  Left 1st toenail thick with TTP at nailbed. No d/c or bleeding. No redness  Psychiatric: He has a normal mood and affect. His speech is normal and behavior is normal. Judgment and thought content normal. Cognition and memory are normal.    Labs reviewed:  Basic Metabolic Panel:  Recent Labs  01/05/16 1057 02/23/16 1323 05/10/16 0910 06/26/16 0825 08/01/16 0954  NA 142  --  139  --  139  K 4.1  --  4.5  --  4.7  CL 92*  --  98  --  96*  CO2 26  --  27  --  26  GLUCOSE 89  --  92  --  98  BUN 39*  --  36*  --  62*  CREATININE 8.50*  --  7.28*  --  12.33*  CALCIUM 8.5*  --  9.6  --  9.3  TSH 4.800* 0.636 5.37* 0.76  --    Liver Function Tests:  Recent Labs  01/05/16 1057 05/10/16 0910 08/01/16 0954  AST 18 10 13   ALT 17 11 10   ALKPHOS 59 49 46  BILITOT 0.3 0.4 0.4  PROT 7.8 7.3 7.7  ALBUMIN 4.3 3.7 4.1   No results for input(s): LIPASE, AMYLASE in the last 8760 hours. No results for input(s): AMMONIA in the last 8760 hours. CBC:  Recent Labs  01/05/16 1057 05/10/16 0910 08/01/16 0954  WBC 5.1 4.0 5.2  NEUTROABS 2.9 2,120 2,704  HGB  --  11.9* 11.8*  HCT 38.2 36.6* 37.5*  MCV 106* 100.5* 109.6*  PLT 195 183 191   Lipid Panel:  Recent Labs   09/22/15 1611  CHOL 194  HDL 62  LDLCALC 113  TRIG 94  CHOLHDL 3.1   Lab Results  Component Value Date   HGBA1C 4.7 (L) 12/22/2014    Procedures: No results found. ECG OBTAINED AND REVIEWED BY MYSELF: afib @ 66 bpm, nml axis, poor R wave progression. No acute ischemic changes. No change since 07/2015  Assessment/Plan   ICD-9-CM ICD-10-CM   1. Well adult exam V70.0 Z00.00   2.  Occult blood in stools 792.1 R19.5 Ambulatory referral to Gastroenterology  3. Generalized abdominal pain 789.07 R10.84 Ambulatory referral to Gastroenterology  4. Anemia of chronic renal failure, stage 5 (HCC) 285.21 N18.5    585.5 D63.1   5. ESRD on dialysis ([place]) 585.6 N18.6    V45.11 Z99.2   6. Human immunodeficiency virus (HIV) disease ([place]) 042 B20   7. Other polyneuropathy ([place]) 357.89 G62.89   8. Hypothyroidism due to acquired atrophy of thyroid 244.8 E03.4    246.8    9. Paronychia of great toe, left 681.11 L03.032    Pt is UTD on health maintenance. Vaccinations are UTD. Pt maintains a healthy lifestyle. Encouraged pt to exercise 30-45 minutes 4-5 times per week. Eat a well balanced diet. Avoid smoking. Limit alcohol intake. Wear seatbelt when riding in the car. Wear sun block (SPF >50) when spending extended times outside.  Soak left foot in warm epsom salt at least BID. If no improvement, will need abx  Will call with referral appt to see GI Dr Paulita Fujita  Continue current medications as ordered  Follow up with specialists as scheduled  Follow up in 3 mos for routine visit   [place]. Perlie Gold  [facility] [address] [place], [place] [address] [phone] Cell (Monday-Friday 8 AM - 5 PM) [phone] After 5 PM and follow prompts

## 2016-08-10 DIAGNOSIS — I120 Hypertensive chronic kidney disease with stage 5 chronic kidney disease or end stage renal disease: Secondary | ICD-10-CM | POA: Diagnosis not present

## 2016-08-10 DIAGNOSIS — N186 End stage renal disease: Secondary | ICD-10-CM | POA: Diagnosis not present

## 2016-08-10 DIAGNOSIS — D631 Anemia in chronic kidney disease: Secondary | ICD-10-CM | POA: Diagnosis not present

## 2016-08-10 DIAGNOSIS — D509 Iron deficiency anemia, unspecified: Secondary | ICD-10-CM | POA: Diagnosis not present

## 2016-08-10 DIAGNOSIS — Z992 Dependence on renal dialysis: Secondary | ICD-10-CM | POA: Diagnosis not present

## 2016-08-10 DIAGNOSIS — N2581 Secondary hyperparathyroidism of renal origin: Secondary | ICD-10-CM | POA: Diagnosis not present

## 2016-08-10 MED FILL — CARVEDILOL 3.125 MG TABLET: 3.125 | 30 days supply | Qty: 60 | Fill #1

## 2016-08-10 MED FILL — LEVOTHYROXINE 100 MCG TAB: 100 | 30 days supply | Qty: 30 | Fill #1

## 2016-08-12 DIAGNOSIS — D631 Anemia in chronic kidney disease: Secondary | ICD-10-CM | POA: Diagnosis not present

## 2016-08-12 DIAGNOSIS — N2581 Secondary hyperparathyroidism of renal origin: Secondary | ICD-10-CM | POA: Diagnosis not present

## 2016-08-12 DIAGNOSIS — D509 Iron deficiency anemia, unspecified: Secondary | ICD-10-CM | POA: Diagnosis not present

## 2016-08-12 DIAGNOSIS — N186 End stage renal disease: Secondary | ICD-10-CM | POA: Diagnosis not present

## 2016-08-14 ENCOUNTER — Ambulatory Visit (INDEPENDENT_AMBULATORY_CARE_PROVIDER_SITE_OTHER): Payer: Medicare Other | Admitting: Internal Medicine

## 2016-08-14 ENCOUNTER — Encounter: Payer: Self-pay | Admitting: Internal Medicine

## 2016-08-14 VITALS — BP 146/89 | HR 82 | Temp 97.9°F | Ht 71.0 in | Wt 245.0 lb

## 2016-08-14 DIAGNOSIS — B182 Chronic viral hepatitis C: Secondary | ICD-10-CM

## 2016-08-14 DIAGNOSIS — K219 Gastro-esophageal reflux disease without esophagitis: Secondary | ICD-10-CM

## 2016-08-14 DIAGNOSIS — B20 Human immunodeficiency virus [HIV] disease: Secondary | ICD-10-CM

## 2016-08-14 MED ORDER — OMEPRAZOLE 20 MG PO CPDR: 20.0000 mg | DELAYED_RELEASE_CAPSULE | Freq: Every day | ORAL | 0 refills | Status: DC | PRN

## 2016-08-14 MED FILL — OMEPRAZOLE DR 20 MG CAPSULE: 20 | 30 days supply | Qty: 30 | Fill #0

## 2016-08-14 NOTE — Patient Instructions (Signed)
Come back in 6 wk for VIRAL LOAD   We will place an order for repeat US of liver    You can take omeprazole 20mg  at bedtime if needed for heart burn

## 2016-08-14 NOTE — Progress Notes (Signed)
Rfv: hiv visit follow up  Patient ID: Bryan Gilmore., male   DOB: 27-Dec-1950, 65 y.o.   MRN: [id number]  HPI 64yo M with hiv disease, CD 4 count of 470/ with low level viremia at last visit. He is on tivicay/prezcobix/lamivudine. He reports having larger abdomen at times that he doesn't feel it is related to weight gain. No recent illnesses. No difficulties with HD.  He notices having chest discomfort when he lays down and also he notices having dark stool for which he is being referred to GI for eval for gi bleed.  Outpatient Encounter Prescriptions as of 08/14/2016  Medication Sig  . allopurinol (ZYLOPRIM) 300 MG tablet TAKE ONE TABLET BY MOUTH ONCE DAILY FOR GOUT  . carvedilol (COREG) 3.125 MG tablet TAKE 1 TABLET BY MOUTH 2 TIMES DAILY WITH A MEAL  . cinacalcet (SENSIPAR) 30 MG tablet Take 30 mg by mouth daily.  . dolutegravir (TIVICAY) 50 MG tablet Take 1 tablet (50 mg total) by mouth daily.  . Ferric Citrate (AURYXIA) 1 GM 210 MG(Fe) TABS Take 2 tablets by mouth 3 (three) times daily with meals.  . gabapentin (NEURONTIN) 300 MG capsule Take 1 cap po BID and 2 caps po qhs  . ibuprofen (ADVIL,MOTRIN) 800 MG tablet Take 800 mg by mouth every 8 (eight) hours as needed.  . lamivudine (EPIVIR) 100 MG tablet TAKE 1/2 TABLET BY MOUTH EVERY DAY  . levothyroxine (SYNTHROID, LEVOTHROID) 125 MCG tablet TAKE 1 TABLET(125 MCG) BY MOUTH DAILY BEFORE BREAKFAST  . losartan (COZAAR) 50 MG tablet Take 1 tablet by mouth daily.  Marland Kitchen nystatin (MYCOSTATIN/NYSTOP) powder Apply topically 2 (two) times daily.  Marland Kitchen PREZCOBIX 800-150 MG tablet TAKE 1 TABLET BY MOUTH EVERY DAY WITH FOOD  . zidovudine (RETROVIR) 300 MG tablet TAKE 1 TABLET BY MOUTH EVERY DAY   No facility-administered encounter medications on file as of 08/14/2016.      Patient Active Problem List   Diagnosis Date Noted  . UTI (urinary tract infection) 06/14/2016  . ESRD on dialysis ([place]) 05/10/2016  . Chronic atrial fibrillation ([place])  05/10/2016  . Peripheral neuropathy ([place]) 05/10/2016  . Left axillary swelling 01/05/2016  . Lump of skin of lower extremity 07/25/2015  . Hypertensive urgency 12/21/2014  . Hypothyroid 12/21/2014  . Chronic hepatitis C without hepatic coma ([place]) 11/25/2014  . Thyroid activity decreased 07/29/2014  . Essential hypertension 06/24/2014  . Floaters in visual field 06/24/2014  . Atrial fibrillation, unspecified 06/24/2014  . Esophageal reflux 06/24/2014  . Gynecomastia 11/12/2013  . Alcohol abuse   . Atrial fibrillation, permanent ([place]) 04/04/2012  . Secondary hyperparathyroidism (of renal origin) 03/29/2012  . Anemia of chronic renal failure 02/29/2012  . Normocytic anemia 02/17/2012  . Acute hepatitis C virus infection 10/07/2010  . Chronic gouty arthropathy 10/07/2010  . 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 Human immunodeficiency virus (HIV) disease ([place]) 09/15/2010     There are no preventive care reminders to display for this patient.   Review of Systems Per hpi  Physical Exam   BP (!) 146/89   Pulse 82   Temp 97.9 F (36.6 C) (Oral)   Ht 5\' 11"  (1.803 m)   Wt 245 lb (111.1 kg)   BMI 34.17 kg/m  Physical Exam  Constitutional: He is oriented to person, place, and time. He appears well-developed and well-nourished. No distress.  HENT:  Mouth/Throat: Oropharynx is clear and moist. No oropharyngeal exudate.  Cardiovascular: Normal rate, regular rhythm and normal heart sounds. Exam  reveals no gallop and no friction rub.  No murmur heard.  Pulmonary/Chest: Effort normal and breath sounds normal. No respiratory distress. He has no wheezes.  Abdominal: Soft. Bowel sounds are normal. He exhibits no distension. There is no tenderness.  Lymphadenopathy:  He has no cervical adenopathy.  Neurological: He is alert and oriented to person, place, and time.  Skin: Skin is warm and dry. No rash noted. No erythema.  Psychiatric: He has a normal mood and affect. His behavior  is normal.     Lab Results  Component Value Date   CD4TCELL 29 (L) 08/01/2016   Lab Results  Component Value Date   CD4TABS 470 08/01/2016   CD4TABS 730 04/17/2016   CD4TABS 740 11/29/2015   Lab Results  Component Value Date   HIV1RNAQUANT 70 (H) 08/01/2016   Lab Results  Component Value Date   HEPBSAB NEGATIVE 02/18/2012   No results found for: RPR  CBC Lab Results  Component Value Date   WBC 5.2 08/01/2016   RBC 3.42 (L) 08/01/2016   HGB 11.8 (L) 08/01/2016   HCT 37.5 (L) 08/01/2016   PLT 191 08/01/2016   MCV 109.6 (H) 08/01/2016   MCH 34.5 (H) 08/01/2016   MCHC 31.5 (L) 08/01/2016   RDW 14.5 08/01/2016   LYMPHSABS 1,872 08/01/2016   MONOABS 520 08/01/2016   EOSABS 52 08/01/2016   BASOSABS 52 08/01/2016   BMET Lab Results  Component Value Date   NA 139 08/01/2016   K 4.7 08/01/2016   CL 96 (L) 08/01/2016   CO2 26 08/01/2016   GLUCOSE 98 08/01/2016   BUN 62 (H) 08/01/2016   CREATININE 12.33 (H) 08/01/2016   CALCIUM 9.3 08/01/2016   GFRNONAA 4 (L) 08/01/2016   GFRAA <4 (L) 08/01/2016     Assessment and Plan  hiv disease= cd 4 coutnt stable at 470 but since the summer he is having low level viremia again at 70 copies. He reports that he probably missed 6-7 doses over the last 2 months. He promised to be more adherence and wants to repeat VL in 6 wk if +, then we will check genosure to find archived mutations to see if can improve his current regimen. 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 Last completed on 01/28/16  Suspected upper GI bleed = he is being referred to see dr outlaw  GERD= most noticeable at bedtime when he lays down. He states that he takes 400mg  ibuprofen to help. He may benefit from EGd to see about UGIB as well as concern for NSAID related gastrititis.he can take omeprazole 20mg  at bedtime to see if that helps

## 2016-08-16 ENCOUNTER — Ambulatory Visit: Payer: Medicare Other | Attending: Internal Medicine | Admitting: Audiology

## 2016-08-16 DIAGNOSIS — Z8679 Personal history of other diseases of the circulatory system: Secondary | ICD-10-CM | POA: Diagnosis not present

## 2016-08-16 DIAGNOSIS — H93299 Other abnormal auditory perceptions, unspecified ear: Secondary | ICD-10-CM | POA: Diagnosis not present

## 2016-08-16 DIAGNOSIS — Z0110 Encounter for examination of ears and hearing without abnormal findings: Secondary | ICD-10-CM | POA: Diagnosis not present

## 2016-08-16 NOTE — Procedures (Signed)
  [facility] [place]  [place], [place] [address]  [phone]   Audiological Evaluation  Patient Name: Bryan Gilmore.   Status: Outpatient   DOB: December 16, 1950    Diagnosis: Hearing Loss Concern MRN: [id number]                                                               Hx dialysis and hypertension     Date:  08/16/2016     Referent: Gildardo Cranker, DO  History: Bryan Gilmore. was seen for an audiological evaluation.  Primary Concern: some family members state they were speaking but "I didn't hear them".   History of ear infections:  N  History of dizziness/vertigo:   N although has some unsteadiness when first getting out of bed in the morning or after sitting a long time.  This has been "going on a while", not getting worse. 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  Hearing protection was required. History of hypertension: Y History of diabetes:  N Family history of hearing loss: Unknown Other concerns: Kidney issues, on dialysis x 4 years Ear pain:  At times, inside nagging pain occation on the left side.  Not hurting now.    Evaluation: Conventional pure tone audiometry from 250Hz  - 8000Hz  with using insert earphones. Hearing Thresholds are 15-20 dBHL bilaterally with a 25 dBHL hearing threshold at 8000Hz  on the right side only. Reliability is good Speech reception levels (repeating words near threshold) using recorded spondee word lists:  Right ear: 15 dBHL.  Left ear:  15 dBHL Word recognition (at comfortably loud volumes) using recorded word lists at 55 dBHL, in quiet.  Right ear: 96%.  Left ear:   100% Word recognition in minimal background noise:  +5 dBHL  Right ear: 80%                              Left ear:  60%  Tympanometry (middle ear function) shows normal middle ear volume, pressure and compliance (Type A) with 90dB ipsilateral acoustic  reflexes from 500Hz  - 4000Hz  bilaterally..   CONCLUSION:  Bryan Gilmore. has normal hearing thresholds and middle ear function bilaterally. Word recognition is excellent in each ear in quiet at conversational speech levels bilaterally. In minimal background noise, word recognition remains good in the right ear and drops to poor in the left ear - however this may be long standing as this configuration is commonly associated with auditory processing issues and there are no other findings that indicate a difference between the left and right ears.    As discussed with Bryan Gilmore. I am very impressed at how great his hearing is at his age!  RECOMMENDATIONS: 1.   Monitor hearing at home and schedule a repeat evaluation if hearing concerns, ringing or balance concerns develop.  Deborah L. Heide Spark, Au.D., CCC-A Doctor of Audiology 08/16/2016  cc: Gildardo Cranker, DO

## 2016-09-01 ENCOUNTER — Ambulatory Visit (HOSPITAL_COMMUNITY)
Admission: RE | Admit: 2016-09-01 | Discharge: 2016-09-01 | Disposition: A | Discharge status: Home / Self Care | Payer: Medicare Other | Source: Ambulatory Visit | Attending: Internal Medicine | Admitting: Internal Medicine

## 2016-09-01 ENCOUNTER — Other Ambulatory Visit: Payer: Self-pay | Admitting: Internal Medicine

## 2016-09-01 DIAGNOSIS — B182 Chronic viral hepatitis C: Secondary | ICD-10-CM

## 2016-09-01 DIAGNOSIS — R932 Abnormal findings on diagnostic imaging of liver and biliary tract: Secondary | ICD-10-CM | POA: Diagnosis not present

## 2016-09-01 DIAGNOSIS — N270 Small kidney, unilateral: Secondary | ICD-10-CM | POA: Diagnosis not present

## 2016-09-01 DIAGNOSIS — B20 Human immunodeficiency virus [HIV] disease: Secondary | ICD-10-CM | POA: Diagnosis not present

## 2016-09-01 DIAGNOSIS — K219 Gastro-esophageal reflux disease without esophagitis: Secondary | ICD-10-CM | POA: Diagnosis not present

## 2016-09-01 DIAGNOSIS — R195 Other fecal abnormalities: Secondary | ICD-10-CM | POA: Diagnosis not present

## 2016-09-01 DIAGNOSIS — R109 Unspecified abdominal pain: Secondary | ICD-10-CM | POA: Diagnosis not present

## 2016-09-06 ENCOUNTER — Other Ambulatory Visit: Payer: Self-pay | Admitting: Internal Medicine

## 2016-09-06 MED FILL — GABAPENTIN 300 MG CAPSULE: 300 | 30 days supply | Qty: 120 | Fill #3

## 2016-09-07 ENCOUNTER — Other Ambulatory Visit: Payer: Self-pay | Admitting: Gastroenterology

## 2016-09-07 DIAGNOSIS — K7460 Unspecified cirrhosis of liver: Secondary | ICD-10-CM

## 2016-09-10 DIAGNOSIS — N186 End stage renal disease: Secondary | ICD-10-CM | POA: Diagnosis not present

## 2016-09-10 DIAGNOSIS — I120 Hypertensive chronic kidney disease with stage 5 chronic kidney disease or end stage renal disease: Secondary | ICD-10-CM | POA: Diagnosis not present

## 2016-09-10 DIAGNOSIS — Z992 Dependence on renal dialysis: Secondary | ICD-10-CM | POA: Diagnosis not present

## 2016-09-12 ENCOUNTER — Other Ambulatory Visit: Payer: Medicare Other

## 2016-09-12 DIAGNOSIS — D509 Iron deficiency anemia, unspecified: Secondary | ICD-10-CM | POA: Diagnosis not present

## 2016-09-12 DIAGNOSIS — N186 End stage renal disease: Secondary | ICD-10-CM | POA: Diagnosis not present

## 2016-09-12 DIAGNOSIS — D631 Anemia in chronic kidney disease: Secondary | ICD-10-CM | POA: Diagnosis not present

## 2016-09-12 DIAGNOSIS — N2581 Secondary hyperparathyroidism of renal origin: Secondary | ICD-10-CM | POA: Diagnosis not present

## 2016-09-14 DIAGNOSIS — D509 Iron deficiency anemia, unspecified: Secondary | ICD-10-CM | POA: Diagnosis not present

## 2016-09-14 DIAGNOSIS — N186 End stage renal disease: Secondary | ICD-10-CM | POA: Diagnosis not present

## 2016-09-14 DIAGNOSIS — N2581 Secondary hyperparathyroidism of renal origin: Secondary | ICD-10-CM | POA: Diagnosis not present

## 2016-09-14 DIAGNOSIS — D631 Anemia in chronic kidney disease: Secondary | ICD-10-CM | POA: Diagnosis not present

## 2016-09-15 ENCOUNTER — Other Ambulatory Visit: Payer: Self-pay | Admitting: Gastroenterology

## 2016-09-15 ENCOUNTER — Ambulatory Visit
Admission: RE | Admit: 2016-09-15 | Discharge: 2016-09-15 | Disposition: A | Discharge status: Home / Self Care | Payer: Medicare Other | Source: Ambulatory Visit | Attending: Gastroenterology | Admitting: Gastroenterology

## 2016-09-15 DIAGNOSIS — K7460 Unspecified cirrhosis of liver: Secondary | ICD-10-CM

## 2016-09-16 DIAGNOSIS — D631 Anemia in chronic kidney disease: Secondary | ICD-10-CM | POA: Diagnosis not present

## 2016-09-16 DIAGNOSIS — N186 End stage renal disease: Secondary | ICD-10-CM | POA: Diagnosis not present

## 2016-09-16 DIAGNOSIS — D509 Iron deficiency anemia, unspecified: Secondary | ICD-10-CM | POA: Diagnosis not present

## 2016-09-16 DIAGNOSIS — N2581 Secondary hyperparathyroidism of renal origin: Secondary | ICD-10-CM | POA: Diagnosis not present

## 2016-09-19 DIAGNOSIS — N2581 Secondary hyperparathyroidism of renal origin: Secondary | ICD-10-CM | POA: Diagnosis not present

## 2016-09-19 DIAGNOSIS — D509 Iron deficiency anemia, unspecified: Secondary | ICD-10-CM | POA: Diagnosis not present

## 2016-09-19 DIAGNOSIS — N186 End stage renal disease: Secondary | ICD-10-CM | POA: Diagnosis not present

## 2016-09-19 DIAGNOSIS — D631 Anemia in chronic kidney disease: Secondary | ICD-10-CM | POA: Diagnosis not present

## 2016-09-21 DIAGNOSIS — N2581 Secondary hyperparathyroidism of renal origin: Secondary | ICD-10-CM | POA: Diagnosis not present

## 2016-09-21 DIAGNOSIS — N186 End stage renal disease: Secondary | ICD-10-CM | POA: Diagnosis not present

## 2016-09-21 DIAGNOSIS — D631 Anemia in chronic kidney disease: Secondary | ICD-10-CM | POA: Diagnosis not present

## 2016-09-21 DIAGNOSIS — D509 Iron deficiency anemia, unspecified: Secondary | ICD-10-CM | POA: Diagnosis not present

## 2016-09-23 DIAGNOSIS — D631 Anemia in chronic kidney disease: Secondary | ICD-10-CM | POA: Diagnosis not present

## 2016-09-23 DIAGNOSIS — D509 Iron deficiency anemia, unspecified: Secondary | ICD-10-CM | POA: Diagnosis not present

## 2016-09-23 DIAGNOSIS — N186 End stage renal disease: Secondary | ICD-10-CM | POA: Diagnosis not present

## 2016-09-23 DIAGNOSIS — N2581 Secondary hyperparathyroidism of renal origin: Secondary | ICD-10-CM | POA: Diagnosis not present

## 2016-09-25 ENCOUNTER — Other Ambulatory Visit: Payer: Self-pay | Admitting: Internal Medicine

## 2016-09-25 ENCOUNTER — Other Ambulatory Visit: Payer: Medicare Other

## 2016-09-25 ENCOUNTER — Other Ambulatory Visit: Payer: Self-pay | Admitting: Gastroenterology

## 2016-09-25 DIAGNOSIS — B20 Human immunodeficiency virus [HIV] disease: Secondary | ICD-10-CM

## 2016-09-26 ENCOUNTER — Encounter (HOSPITAL_COMMUNITY): Payer: Self-pay | Admitting: *Deleted

## 2016-09-26 DIAGNOSIS — N186 End stage renal disease: Secondary | ICD-10-CM | POA: Diagnosis not present

## 2016-09-26 DIAGNOSIS — D631 Anemia in chronic kidney disease: Secondary | ICD-10-CM | POA: Diagnosis not present

## 2016-09-26 DIAGNOSIS — N2581 Secondary hyperparathyroidism of renal origin: Secondary | ICD-10-CM | POA: Diagnosis not present

## 2016-09-26 DIAGNOSIS — D509 Iron deficiency anemia, unspecified: Secondary | ICD-10-CM | POA: Diagnosis not present

## 2016-09-26 LAB — T-HELPER CELL (CD4) - (RCID CLINIC ONLY)
CD4 % Helper T Cell: 36 % (ref 33–55)
CD4 T CELL ABS: 520 /uL (ref 400–2700)

## 2016-09-27 ENCOUNTER — Ambulatory Visit (HOSPITAL_COMMUNITY): Admission: RE | Admit: 2016-09-27 | Payer: Medicare Other | Source: Ambulatory Visit | Admitting: Gastroenterology

## 2016-09-27 ENCOUNTER — Encounter (HOSPITAL_COMMUNITY): Payer: Self-pay | Admitting: Anesthesiology

## 2016-09-27 SURGERY — ESOPHAGOGASTRODUODENOSCOPY (EGD) WITH PROPOFOL
Anesthesia: Monitor Anesthesia Care

## 2016-09-27 MED ORDER — PROPOFOL 10 MG/ML IV BOLUS
INTRAVENOUS | Status: AC
  Filled 2016-09-27: qty 60

## 2016-09-27 MED ORDER — LIDOCAINE 2% (20 MG/ML) 5 ML SYRINGE
INTRAMUSCULAR | Status: AC
  Filled 2016-09-27: qty 5

## 2016-09-28 DIAGNOSIS — N186 End stage renal disease: Secondary | ICD-10-CM | POA: Diagnosis not present

## 2016-09-28 DIAGNOSIS — N2581 Secondary hyperparathyroidism of renal origin: Secondary | ICD-10-CM | POA: Diagnosis not present

## 2016-09-28 DIAGNOSIS — D631 Anemia in chronic kidney disease: Secondary | ICD-10-CM | POA: Diagnosis not present

## 2016-09-28 DIAGNOSIS — D509 Iron deficiency anemia, unspecified: Secondary | ICD-10-CM | POA: Diagnosis not present

## 2016-09-29 LAB — HIV-1 RNA QUANT-NO REFLEX-BLD
HIV 1 RNA QUANT: 71 {copies}/mL — AB (ref <20)
HIV-1 RNA Quant, Log: 1.85 Log copies/mL — ABNORMAL HIGH (ref <1.30)

## 2016-09-30 DIAGNOSIS — D631 Anemia in chronic kidney disease: Secondary | ICD-10-CM | POA: Diagnosis not present

## 2016-09-30 DIAGNOSIS — D509 Iron deficiency anemia, unspecified: Secondary | ICD-10-CM | POA: Diagnosis not present

## 2016-09-30 DIAGNOSIS — N186 End stage renal disease: Secondary | ICD-10-CM | POA: Diagnosis not present

## 2016-09-30 DIAGNOSIS — N2581 Secondary hyperparathyroidism of renal origin: Secondary | ICD-10-CM | POA: Diagnosis not present

## 2016-10-03 ENCOUNTER — Other Ambulatory Visit: Payer: Self-pay | Admitting: Internal Medicine

## 2016-10-03 DIAGNOSIS — D631 Anemia in chronic kidney disease: Secondary | ICD-10-CM | POA: Diagnosis not present

## 2016-10-03 DIAGNOSIS — N2581 Secondary hyperparathyroidism of renal origin: Secondary | ICD-10-CM | POA: Diagnosis not present

## 2016-10-03 DIAGNOSIS — D509 Iron deficiency anemia, unspecified: Secondary | ICD-10-CM | POA: Diagnosis not present

## 2016-10-03 DIAGNOSIS — N186 End stage renal disease: Secondary | ICD-10-CM | POA: Diagnosis not present

## 2016-10-05 DIAGNOSIS — N2581 Secondary hyperparathyroidism of renal origin: Secondary | ICD-10-CM | POA: Diagnosis not present

## 2016-10-05 DIAGNOSIS — D631 Anemia in chronic kidney disease: Secondary | ICD-10-CM | POA: Diagnosis not present

## 2016-10-05 DIAGNOSIS — N186 End stage renal disease: Secondary | ICD-10-CM | POA: Diagnosis not present

## 2016-10-05 DIAGNOSIS — D509 Iron deficiency anemia, unspecified: Secondary | ICD-10-CM | POA: Diagnosis not present

## 2016-10-07 DIAGNOSIS — N186 End stage renal disease: Secondary | ICD-10-CM | POA: Diagnosis not present

## 2016-10-07 DIAGNOSIS — D631 Anemia in chronic kidney disease: Secondary | ICD-10-CM | POA: Diagnosis not present

## 2016-10-07 DIAGNOSIS — D509 Iron deficiency anemia, unspecified: Secondary | ICD-10-CM | POA: Diagnosis not present

## 2016-10-07 DIAGNOSIS — N2581 Secondary hyperparathyroidism of renal origin: Secondary | ICD-10-CM | POA: Diagnosis not present

## 2016-10-10 DIAGNOSIS — N2581 Secondary hyperparathyroidism of renal origin: Secondary | ICD-10-CM | POA: Diagnosis not present

## 2016-10-10 DIAGNOSIS — N186 End stage renal disease: Secondary | ICD-10-CM | POA: Diagnosis not present

## 2016-10-10 DIAGNOSIS — D509 Iron deficiency anemia, unspecified: Secondary | ICD-10-CM | POA: Diagnosis not present

## 2016-10-10 DIAGNOSIS — D631 Anemia in chronic kidney disease: Secondary | ICD-10-CM | POA: Diagnosis not present

## 2016-10-11 DIAGNOSIS — Z992 Dependence on renal dialysis: Secondary | ICD-10-CM | POA: Diagnosis not present

## 2016-10-11 DIAGNOSIS — N186 End stage renal disease: Secondary | ICD-10-CM | POA: Diagnosis not present

## 2016-10-11 DIAGNOSIS — I120 Hypertensive chronic kidney disease with stage 5 chronic kidney disease or end stage renal disease: Secondary | ICD-10-CM | POA: Diagnosis not present

## 2016-10-12 DIAGNOSIS — N2581 Secondary hyperparathyroidism of renal origin: Secondary | ICD-10-CM | POA: Diagnosis not present

## 2016-10-12 DIAGNOSIS — D509 Iron deficiency anemia, unspecified: Secondary | ICD-10-CM | POA: Diagnosis not present

## 2016-10-12 DIAGNOSIS — E1129 Type 2 diabetes mellitus with other diabetic kidney complication: Secondary | ICD-10-CM | POA: Diagnosis not present

## 2016-10-12 DIAGNOSIS — N186 End stage renal disease: Secondary | ICD-10-CM | POA: Diagnosis not present

## 2016-10-12 DIAGNOSIS — D631 Anemia in chronic kidney disease: Secondary | ICD-10-CM | POA: Diagnosis not present

## 2016-10-14 DIAGNOSIS — D631 Anemia in chronic kidney disease: Secondary | ICD-10-CM | POA: Diagnosis not present

## 2016-10-14 DIAGNOSIS — N2581 Secondary hyperparathyroidism of renal origin: Secondary | ICD-10-CM | POA: Diagnosis not present

## 2016-10-14 DIAGNOSIS — E1129 Type 2 diabetes mellitus with other diabetic kidney complication: Secondary | ICD-10-CM | POA: Diagnosis not present

## 2016-10-14 DIAGNOSIS — D509 Iron deficiency anemia, unspecified: Secondary | ICD-10-CM | POA: Diagnosis not present

## 2016-10-14 DIAGNOSIS — N186 End stage renal disease: Secondary | ICD-10-CM | POA: Diagnosis not present

## 2016-10-16 ENCOUNTER — Encounter: Payer: Self-pay | Admitting: Internal Medicine

## 2016-10-16 ENCOUNTER — Ambulatory Visit (INDEPENDENT_AMBULATORY_CARE_PROVIDER_SITE_OTHER): Payer: Medicare Other | Admitting: Internal Medicine

## 2016-10-16 VITALS — BP 155/82 | HR 96 | Temp 97.5°F | Ht 71.0 in | Wt 252.0 lb

## 2016-10-16 DIAGNOSIS — K0889 Other specified disorders of teeth and supporting structures: Secondary | ICD-10-CM | POA: Diagnosis not present

## 2016-10-16 DIAGNOSIS — B20 Human immunodeficiency virus [HIV] disease: Secondary | ICD-10-CM | POA: Diagnosis present

## 2016-10-16 DIAGNOSIS — R635 Abnormal weight gain: Secondary | ICD-10-CM

## 2016-10-16 DIAGNOSIS — N186 End stage renal disease: Secondary | ICD-10-CM

## 2016-10-16 DIAGNOSIS — B182 Chronic viral hepatitis C: Secondary | ICD-10-CM

## 2016-10-16 NOTE — Progress Notes (Signed)
RFV: follow up for hiv disease  Patient ID: Bryan Gilmore., male   DOB: 04/20/1951, 66 y.o.   MRN: [id number]  HPI 66yo M with hiv disease, CD 4 count of 520/VL 71 copies. He is currently on DLG/DRVr/retrovir. He is concerned about his weight gain. He does report having less exercise this winter.  He states he has limited protein in diet, but does have good portion of vegetables and carbs. Has access to dietician through renal clinic  Outpatient Encounter Prescriptions as of 10/16/2016  Medication Sig  . allopurinol (ZYLOPRIM) 300 MG tablet TAKE ONE TABLET BY MOUTH ONCE DAILY FOR GOUT (Patient taking differently: TAKE 300MG  BY MOUTH ONCE DAILY FOR GOUT)  . carvedilol (COREG) 3.125 MG tablet TAKE 1 TABLET BY MOUTH 2 TIMES DAILY WITH A MEAL (Patient taking differently: TAKE 3.125mg   BY MOUTH 2 TIMES DAILY WITH A MEAL)  . cinacalcet (SENSIPAR) 30 MG tablet Take 30 mg by mouth 3 (three) times a week.   . dolutegravir (TIVICAY) 50 MG tablet Take 1 tablet (50 mg total) by mouth daily.  Marland Kitchen gabapentin (NEURONTIN) 300 MG capsule Take 1 cap po BID and 2 caps po qhs (Patient taking differently: Take 300 mg by mouth 2 (two) times daily. )  . ibuprofen (ADVIL,MOTRIN) 800 MG tablet Take 800 mg by mouth every 8 (eight) hours as needed for headache or moderate pain.  Marland Kitchen lamivudine (EPIVIR) 100 MG tablet TAKE 1/2 TABLET BY MOUTH EVERY DAY (Patient taking differently: TAKE 100mg  BY MOUTH EVERY DAY)  . levothyroxine (SYNTHROID, LEVOTHROID) 125 MCG tablet TAKE 1 TABLET(125 MCG) BY MOUTH DAILY BEFORE BREAKFAST  . losartan (COZAAR) 50 MG tablet Take 50 mg by mouth daily.   Marland Kitchen PREZCOBIX 800-150 MG tablet TAKE 1 TABLET BY MOUTH EVERY DAY WITH FOOD  . zidovudine (RETROVIR) 300 MG tablet TAKE 1 TABLET BY MOUTH EVERY DAY   No facility-administered encounter medications on file as of 10/16/2016.      Patient Active Problem List   Diagnosis Date Noted  . UTI (urinary tract infection) 06/14/2016  . ESRD on dialysis  ([place]) 05/10/2016  . Chronic atrial fibrillation ([place]) 05/10/2016  . Peripheral neuropathy ([place]) 05/10/2016  . Left axillary swelling 01/05/2016  . Lump of skin of lower extremity 07/25/2015  . Hypertensive urgency 12/21/2014  . Hypothyroid 12/21/2014  . Chronic hepatitis C without hepatic coma ([place]) 11/25/2014  . Thyroid activity decreased 07/29/2014  . Essential hypertension 06/24/2014  . Floaters in visual field 06/24/2014  . Atrial fibrillation, unspecified 06/24/2014  . Esophageal reflux 06/24/2014  . Gynecomastia 11/12/2013  . Alcohol abuse   . Atrial fibrillation, permanent ([place]) 04/04/2012  . Secondary hyperparathyroidism (of renal origin) 03/29/2012  . Anemia of chronic renal failure 02/29/2012  . Normocytic anemia 02/17/2012  . Acute hepatitis C virus infection 10/07/2010  . Chronic gouty arthropathy 10/07/2010  . 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 Human immunodeficiency virus (HIV) disease ([place]) 09/15/2010     There are no preventive care reminders to display for this patient.   Review of Systems + poorly fitting dentures and 7# weight gain since last month visit. otherwis 10 point ros is negative Physical Exam   BP (!) 155/82   Pulse 96   Temp 97.5 F (36.4 C) (Oral)   Ht 5\' 11"  (1.803 m)   Wt 252 lb (114.3 kg)   BMI 35.15 kg/m   Physical Exam  Constitutional: He is oriented to person, place, and time. He appears well-developed and well-nourished. No  distress.  HENT:  Mouth/Throat: Oropharynx is clear and moist. No oropharyngeal exudate.  Cardiovascular: Normal rate, regular rhythm and normal heart sounds. Exam reveals no gallop and no friction rub.  No murmur heard.  Pulmonary/Chest: Effort normal and breath sounds normal. No respiratory distress. He has no wheezes.  Abdominal: Soft. Bowel sounds are normal. He exhibits no distension. There is no tenderness.  Lymphadenopathy:  He has no cervical adenopathy.  Neurological: He is alert and oriented  to person, place, and time.  Skin: Skin is warm and dry. No rash noted. No erythema.  Psychiatric: He has a normal mood and affect. His behavior is normal.    Lab Results  Component Value Date   CD4TCELL 36 09/25/2016   Lab Results  Component Value Date   CD4TABS 520 09/25/2016   CD4TABS 470 08/01/2016   CD4TABS 730 04/17/2016   Lab Results  Component Value Date   HIV1RNAQUANT 71 (H) 09/25/2016   Lab Results  Component Value Date   HEPBSAB NEGATIVE 02/18/2012   Lab Results  Component Value Date   LABRPR NON REAC 09/22/2015    CBC Lab Results  Component Value Date   WBC 5.2 08/01/2016   RBC 3.42 (L) 08/01/2016   HGB 11.8 (L) 08/01/2016   HCT 37.5 (L) 08/01/2016   PLT 191 08/01/2016   MCV 109.6 (H) 08/01/2016   MCH 34.5 (H) 08/01/2016   MCHC 31.5 (L) 08/01/2016   RDW 14.5 08/01/2016   LYMPHSABS 1,872 08/01/2016   MONOABS 520 08/01/2016   EOSABS 52 08/01/2016    BMET Lab Results  Component Value Date   NA 139 08/01/2016   K 4.7 08/01/2016   CL 96 (L) 08/01/2016   CO2 26 08/01/2016   GLUCOSE 98 08/01/2016   BUN 62 (H) 08/01/2016   CREATININE 12.33 (H) 08/01/2016   CALCIUM 9.3 08/01/2016   GFRNONAA 4 (L) 08/01/2016   GFRAA <4 (L) 08/01/2016      Assessment and Plan  hiv disease = has low level viremia of 70 copies. Much better adherence. 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 Increase exercise  Denture pain = will refer to dentistry to help with better fit  rtc in 3 months

## 2016-10-17 DIAGNOSIS — N186 End stage renal disease: Secondary | ICD-10-CM | POA: Diagnosis not present

## 2016-10-17 DIAGNOSIS — E1129 Type 2 diabetes mellitus with other diabetic kidney complication: Secondary | ICD-10-CM | POA: Diagnosis not present

## 2016-10-17 DIAGNOSIS — N2581 Secondary hyperparathyroidism of renal origin: Secondary | ICD-10-CM | POA: Diagnosis not present

## 2016-10-17 DIAGNOSIS — D509 Iron deficiency anemia, unspecified: Secondary | ICD-10-CM | POA: Diagnosis not present

## 2016-10-17 DIAGNOSIS — D631 Anemia in chronic kidney disease: Secondary | ICD-10-CM | POA: Diagnosis not present

## 2016-10-19 DIAGNOSIS — D509 Iron deficiency anemia, unspecified: Secondary | ICD-10-CM | POA: Diagnosis not present

## 2016-10-19 DIAGNOSIS — N186 End stage renal disease: Secondary | ICD-10-CM | POA: Diagnosis not present

## 2016-10-19 DIAGNOSIS — D631 Anemia in chronic kidney disease: Secondary | ICD-10-CM | POA: Diagnosis not present

## 2016-10-19 DIAGNOSIS — N2581 Secondary hyperparathyroidism of renal origin: Secondary | ICD-10-CM | POA: Diagnosis not present

## 2016-10-19 DIAGNOSIS — E1129 Type 2 diabetes mellitus with other diabetic kidney complication: Secondary | ICD-10-CM | POA: Diagnosis not present

## 2016-10-23 DIAGNOSIS — D631 Anemia in chronic kidney disease: Secondary | ICD-10-CM | POA: Diagnosis not present

## 2016-10-23 DIAGNOSIS — N186 End stage renal disease: Secondary | ICD-10-CM | POA: Diagnosis not present

## 2016-10-23 DIAGNOSIS — E1129 Type 2 diabetes mellitus with other diabetic kidney complication: Secondary | ICD-10-CM | POA: Diagnosis not present

## 2016-10-23 DIAGNOSIS — N2581 Secondary hyperparathyroidism of renal origin: Secondary | ICD-10-CM | POA: Diagnosis not present

## 2016-10-23 DIAGNOSIS — D509 Iron deficiency anemia, unspecified: Secondary | ICD-10-CM | POA: Diagnosis not present

## 2016-10-24 ENCOUNTER — Other Ambulatory Visit: Payer: Self-pay | Admitting: Gastroenterology

## 2016-10-25 DIAGNOSIS — N2581 Secondary hyperparathyroidism of renal origin: Secondary | ICD-10-CM | POA: Diagnosis not present

## 2016-10-25 DIAGNOSIS — E1129 Type 2 diabetes mellitus with other diabetic kidney complication: Secondary | ICD-10-CM | POA: Diagnosis not present

## 2016-10-25 DIAGNOSIS — D509 Iron deficiency anemia, unspecified: Secondary | ICD-10-CM | POA: Diagnosis not present

## 2016-10-25 DIAGNOSIS — D631 Anemia in chronic kidney disease: Secondary | ICD-10-CM | POA: Diagnosis not present

## 2016-10-25 DIAGNOSIS — N186 End stage renal disease: Secondary | ICD-10-CM | POA: Diagnosis not present

## 2016-10-27 DIAGNOSIS — D631 Anemia in chronic kidney disease: Secondary | ICD-10-CM | POA: Diagnosis not present

## 2016-10-27 DIAGNOSIS — D509 Iron deficiency anemia, unspecified: Secondary | ICD-10-CM | POA: Diagnosis not present

## 2016-10-27 DIAGNOSIS — E1129 Type 2 diabetes mellitus with other diabetic kidney complication: Secondary | ICD-10-CM | POA: Diagnosis not present

## 2016-10-27 DIAGNOSIS — N186 End stage renal disease: Secondary | ICD-10-CM | POA: Diagnosis not present

## 2016-10-27 DIAGNOSIS — N2581 Secondary hyperparathyroidism of renal origin: Secondary | ICD-10-CM | POA: Diagnosis not present

## 2016-10-30 ENCOUNTER — Encounter: Payer: Self-pay | Admitting: Internal Medicine

## 2016-10-31 ENCOUNTER — Other Ambulatory Visit: Payer: Self-pay | Admitting: Gastroenterology

## 2016-10-31 DIAGNOSIS — N186 End stage renal disease: Secondary | ICD-10-CM | POA: Diagnosis not present

## 2016-10-31 DIAGNOSIS — N2581 Secondary hyperparathyroidism of renal origin: Secondary | ICD-10-CM | POA: Diagnosis not present

## 2016-10-31 DIAGNOSIS — E1129 Type 2 diabetes mellitus with other diabetic kidney complication: Secondary | ICD-10-CM | POA: Diagnosis not present

## 2016-10-31 DIAGNOSIS — D509 Iron deficiency anemia, unspecified: Secondary | ICD-10-CM | POA: Diagnosis not present

## 2016-10-31 DIAGNOSIS — D631 Anemia in chronic kidney disease: Secondary | ICD-10-CM | POA: Diagnosis not present

## 2016-11-01 ENCOUNTER — Encounter (HOSPITAL_COMMUNITY): Payer: Self-pay

## 2016-11-01 ENCOUNTER — Ambulatory Visit (HOSPITAL_COMMUNITY)
Admission: RE | Admit: 2016-11-01 | Discharge: 2016-11-01 | Disposition: A | Discharge status: Home / Self Care | Payer: Medicare Other | Source: Ambulatory Visit | Attending: Gastroenterology | Admitting: Gastroenterology

## 2016-11-01 ENCOUNTER — Ambulatory Visit (HOSPITAL_COMMUNITY): Payer: Medicare Other | Admitting: Registered Nurse

## 2016-11-01 ENCOUNTER — Encounter (HOSPITAL_COMMUNITY)
Admission: RE | Disposition: A | Discharge status: Home / Self Care | Payer: Self-pay | Source: Ambulatory Visit | Attending: Gastroenterology

## 2016-11-01 DIAGNOSIS — E669 Obesity, unspecified: Secondary | ICD-10-CM | POA: Insufficient documentation

## 2016-11-01 DIAGNOSIS — Z6835 Body mass index (BMI) 35.0-35.9, adult: Secondary | ICD-10-CM | POA: Diagnosis not present

## 2016-11-01 DIAGNOSIS — M109 Gout, unspecified: Secondary | ICD-10-CM | POA: Insufficient documentation

## 2016-11-01 DIAGNOSIS — Z21 Asymptomatic human immunodeficiency virus [HIV] infection status: Secondary | ICD-10-CM | POA: Diagnosis not present

## 2016-11-01 DIAGNOSIS — I739 Peripheral vascular disease, unspecified: Secondary | ICD-10-CM | POA: Diagnosis not present

## 2016-11-01 DIAGNOSIS — N186 End stage renal disease: Secondary | ICD-10-CM | POA: Diagnosis not present

## 2016-11-01 DIAGNOSIS — Z992 Dependence on renal dialysis: Secondary | ICD-10-CM | POA: Insufficient documentation

## 2016-11-01 DIAGNOSIS — K648 Other hemorrhoids: Secondary | ICD-10-CM | POA: Diagnosis not present

## 2016-11-01 DIAGNOSIS — I120 Hypertensive chronic kidney disease with stage 5 chronic kidney disease or end stage renal disease: Secondary | ICD-10-CM | POA: Insufficient documentation

## 2016-11-01 DIAGNOSIS — D123 Benign neoplasm of transverse colon: Secondary | ICD-10-CM | POA: Diagnosis not present

## 2016-11-01 DIAGNOSIS — T182XXA Foreign body in stomach, initial encounter: Secondary | ICD-10-CM | POA: Diagnosis not present

## 2016-11-01 DIAGNOSIS — K2970 Gastritis, unspecified, without bleeding: Secondary | ICD-10-CM | POA: Diagnosis not present

## 2016-11-01 DIAGNOSIS — K921 Melena: Secondary | ICD-10-CM | POA: Insufficient documentation

## 2016-11-01 DIAGNOSIS — I252 Old myocardial infarction: Secondary | ICD-10-CM | POA: Insufficient documentation

## 2016-11-01 DIAGNOSIS — E039 Hypothyroidism, unspecified: Secondary | ICD-10-CM | POA: Insufficient documentation

## 2016-11-01 DIAGNOSIS — I4891 Unspecified atrial fibrillation: Secondary | ICD-10-CM | POA: Diagnosis not present

## 2016-11-01 DIAGNOSIS — M1990 Unspecified osteoarthritis, unspecified site: Secondary | ICD-10-CM | POA: Insufficient documentation

## 2016-11-01 DIAGNOSIS — G629 Polyneuropathy, unspecified: Secondary | ICD-10-CM | POA: Insufficient documentation

## 2016-11-01 DIAGNOSIS — R1084 Generalized abdominal pain: Secondary | ICD-10-CM | POA: Diagnosis not present

## 2016-11-01 DIAGNOSIS — K219 Gastro-esophageal reflux disease without esophagitis: Secondary | ICD-10-CM | POA: Insufficient documentation

## 2016-11-01 DIAGNOSIS — K2960 Other gastritis without bleeding: Secondary | ICD-10-CM | POA: Diagnosis not present

## 2016-11-01 HISTORY — PX: ESOPHAGOGASTRODUODENOSCOPY (EGD) WITH PROPOFOL: SHX5813

## 2016-11-01 HISTORY — PX: COLONOSCOPY WITH PROPOFOL: SHX5780

## 2016-11-01 SURGERY — ESOPHAGOGASTRODUODENOSCOPY (EGD) WITH PROPOFOL
Anesthesia: Monitor Anesthesia Care

## 2016-11-01 MED ORDER — LIDOCAINE 2% (20 MG/ML) 5 ML SYRINGE
INTRAMUSCULAR | Status: DC | PRN
  Administered 2016-11-01: 100 mg via INTRAVENOUS

## 2016-11-01 MED ORDER — PROPOFOL 500 MG/50ML IV EMUL
INTRAVENOUS | Status: DC | PRN
  Administered 2016-11-01: 120 ug/kg/min via INTRAVENOUS

## 2016-11-01 MED ORDER — BUTAMBEN-TETRACAINE-BENZOCAINE 2-2-14 % EX AERO
INHALATION_SPRAY | CUTANEOUS | Status: DC | PRN
  Administered 2016-11-01: 1 via TOPICAL

## 2016-11-01 MED ORDER — PROPOFOL 10 MG/ML IV BOLUS
INTRAVENOUS | Status: AC
  Filled 2016-11-01: qty 20

## 2016-11-01 MED ORDER — PHENYLEPHRINE 40 MCG/ML (10ML) SYRINGE FOR IV PUSH (FOR BLOOD PRESSURE SUPPORT)
PREFILLED_SYRINGE | INTRAVENOUS | Status: AC
  Filled 2016-11-01: qty 10

## 2016-11-01 MED ORDER — SODIUM CHLORIDE 0.9 % IV SOLN: INTRAVENOUS | Status: DC

## 2016-11-01 MED ORDER — PROPOFOL 10 MG/ML IV BOLUS
INTRAVENOUS | Status: DC | PRN
  Administered 2016-11-01: 20 mg via INTRAVENOUS
  Administered 2016-11-01: 10 mg via INTRAVENOUS
  Administered 2016-11-01: 30 mg via INTRAVENOUS

## 2016-11-01 MED ORDER — SODIUM CHLORIDE 0.9 % IV SOLN
INTRAVENOUS | Status: DC
  Administered 2016-11-01: 08:00:00 via INTRAVENOUS

## 2016-11-01 MED ORDER — LIDOCAINE 2% (20 MG/ML) 5 ML SYRINGE
INTRAMUSCULAR | Status: AC
  Filled 2016-11-01: qty 5

## 2016-11-01 MED ORDER — PROPOFOL 10 MG/ML IV BOLUS
INTRAVENOUS | Status: AC
  Filled 2016-11-01: qty 40

## 2016-11-01 MED ORDER — PHENYLEPHRINE HCL 10 MG/ML IJ SOLN
INTRAMUSCULAR | Status: DC | PRN
  Administered 2016-11-01: 80 ug via INTRAVENOUS

## 2016-11-01 SURGICAL SUPPLY — 25 items

## 2016-11-01 NOTE — Discharge Instructions (Signed)

## 2016-11-01 NOTE — Op Note (Signed)
[facility] Patient Name: Bryan Gilmore Procedure Date: 11/01/2016 MRN: [id number] Attending MD: Arta Silence , MD Date of Birth: 03-Jan-1951 CSN: [id number] Age: 66 Admit Type: Outpatient Procedure:                Upper GI endoscopy Indications:              Generalized abdominal pain, Melena Providers:                Arta Silence, MD, Carolynn Comment, RN, Corliss Parish, Technician, Courtney Heys. [place], CRNA Referring MD:              Medicines:                Monitored Anesthesia Care Complications:            No immediate complications. Estimated Blood Loss:     Estimated blood loss: none. Procedure:                Pre-Anesthesia Assessment:                           - Prior to the procedure, a History and Physical                            was performed, and patient medications and                            allergies were reviewed. The patient's tolerance of                            previous anesthesia was also reviewed. The risks                            and benefits of the procedure and the sedation                            options and risks were discussed with the patient.                            All questions were answered, and informed consent                            was obtained. Prior Anticoagulants: The patient has                            taken no previous anticoagulant or antiplatelet                            agents. ASA Grade Assessment: III - A patient with                            severe systemic disease. After reviewing the risks  and benefits, the patient was deemed in                            satisfactory condition to undergo the procedure.                           After obtaining informed consent, the endoscope was                            passed under direct vision. Throughout the                            procedure, the patient's blood pressure, pulse, and                     oxygen saturations were monitored continuously. The                            [id number]) scope was introduced through the                            mouth, and advanced to the second part of duodenum.                            The upper GI endoscopy was accomplished without                            difficulty. The patient tolerated the procedure                            well. Scope In: Scope Out: Findings:      The examined esophagus was normal.      A medium amount of a phytobezoar was found in the gastric antrum,       obscuring some views of the antrum and distal body.      Patchy mild nodular inflammation characterized by congestion (edema) and       erosions was found in the prepyloric region of the stomach.      The exam of the stomach was otherwise normal.      The duodenal bulb, first portion of the duodenum and second portion of       the duodenum were normal. Impression:               - Normal esophagus.                           - A medium amount of a phytobezoar in the stomach.                           - Gastritis.                           - Normal duodenal bulb, first portion of the                            duodenum and second portion of the duodenum.                           -  No specimens collected. Moderate Sedation:      None Recommendation:           - Perform a colonoscopy today.                           - Make the patient NPO until procedures are done.                           - Continue present medications. Procedure Code(s):        --- Professional ---                           [phone], Esophagogastroduodenoscopy, flexible,                            transoral; diagnostic, including collection of                            specimen(s) by brushing or washing, when performed                            (separate procedure) Diagnosis Code(s):        --- Professional ---                           [id number], Foreign body in  stomach, initial encounter                           K29.70, Gastritis, unspecified, without bleeding                           R10.84, Generalized abdominal pain                           K92.1, Melena (includes Hematochezia) CPT copyright 2016 American [employer] Association. All rights reserved. The codes documented in this report are preliminary and upon coder review may  be revised to meet current compliance requirements. Arta Silence, MD 11/01/2016 9:12:42 AM This report has been signed electronically. Number of Addenda: 0

## 2016-11-01 NOTE — Anesthesia Preprocedure Evaluation (Addendum)
Anesthesia Evaluation  Patient identified by MRN, date of birth, ID band Patient awake    Reviewed: Allergy & Precautions, NPO status , Patient's Chart, lab work & pertinent test results, reviewed documented beta blocker date and time   Airway Mallampati: II  TM Distance: >3 FB Neck ROM: Full    Dental  (+) Poor Dentition, Missing   Pulmonary neg pulmonary ROS,    Pulmonary exam normal breath sounds clear to auscultation       Cardiovascular hypertension, Pt. on medications and Pt. on home beta blockers + Past MI and + Peripheral Vascular Disease  + dysrhythmias Atrial Fibrillation  Rhythm:Irregular Rate:Normal + Systolic murmurs    Neuro/Psych Peripheral neuropathy  Neuromuscular disease negative psych ROS   GI/Hepatic GERD  ,(+)     substance abuse  alcohol use, Hepatitis -, A, B, CHx/o Colostomy and reversal Abdominal pain Dark stools   Endo/Other  Hypothyroidism Obesity  Renal/GU ESRF and DialysisRenal diseaseLast dialysed yesterday   Hx/o fournier's gangrene    Musculoskeletal  (+) Arthritis , Gout   Abdominal (+) + obese,   Peds  Hematology  (+) anemia , HIV,   Anesthesia Other Findings   Reproductive/Obstetrics                            Anesthesia Physical Anesthesia Plan  ASA: IV  Anesthesia Plan: MAC   Post-op Pain Management:    Induction: Intravenous  Airway Management Planned: Natural Airway and Nasal Cannula  Additional Equipment:   Intra-op Plan:   Post-operative Plan:   Informed Consent: I have reviewed the patients History and Physical, chart, labs and discussed the procedure including the risks, benefits and alternatives for the proposed anesthesia with the patient or authorized representative who has indicated his/her understanding and acceptance.     Plan Discussed with: Anesthesiologist, CRNA and Surgeon  Anesthesia Plan Comments:          Anesthesia Quick Evaluation

## 2016-11-01 NOTE — Op Note (Signed)
[facility] Patient Name: Bryan Gilmore Procedure Date: 11/01/2016 MRN: [id number] Attending MD: Arta Silence , MD Date of Birth: July 08, 1951 CSN: [id number] Age: 66 Admit Type: Outpatient Procedure:                Colonoscopy Indications:              This is the patient's first colonoscopy,                            Generalized abdominal pain, Melena Providers:                Arta Silence, MD, Carolynn Comment, RN, Corliss Parish, Technician Referring MD:              Medicines:                Monitored Anesthesia Care Complications:            No immediate complications. Estimated Blood Loss:     Estimated blood loss: none. Procedure:                Pre-Anesthesia Assessment:                           - Prior to the procedure, a History and Physical                            was performed, and patient medications and                            allergies were reviewed. The patient's tolerance of                            previous anesthesia was also reviewed. The risks                            and benefits of the procedure and the sedation                            options and risks were discussed with the patient.                            All questions were answered, and informed consent                            was obtained. Prior Anticoagulants: The patient has                            taken no previous anticoagulant or antiplatelet                            agents. ASA Grade Assessment: III - A patient with                            severe systemic  disease. After reviewing the risks                            and benefits, the patient was deemed in                            satisfactory condition to undergo the procedure.                           - Prior to the procedure, a History and Physical                            was performed, and patient medications and                            allergies were reviewed. The  patient's tolerance of                            previous anesthesia was also reviewed. The risks                            and benefits of the procedure and the sedation                            options and risks were discussed with the patient.                            All questions were answered, and informed consent                            was obtained. Prior Anticoagulants: The patient has                            taken no previous anticoagulant or antiplatelet                            agents. ASA Grade Assessment: III - A patient with                            severe systemic disease. After reviewing the risks                            and benefits, the patient was deemed in                            satisfactory condition to undergo the procedure.                           After obtaining informed consent, the colonoscope                            was passed under direct vision. Throughout the  procedure, the patient's blood pressure, pulse, and                            oxygen saturations were monitored continuously. The                            [id number]) scope was introduced through                            the anus and advanced to the the cecum, identified                            by appendiceal orifice and ileocecal valve. The                            ileocecal valve, appendiceal orifice, and rectum                            were photographed. The entire colon was examined.                            The colonoscopy was performed without difficulty.                            The patient tolerated the procedure well. The                            quality of the bowel preparation was fair. Findings:      The perianal and digital rectal examinations were normal.      Internal hemorrhoids were found during retroflexion. The hemorrhoids       were mild.      No additional abnormalities were found on retroflexion.       A 6 mm polyp was found in the transverse colon. The polyp was sessile.       The polyp was removed with a hot snare. Resection and retrieval were       complete.      Bowel prep diffusely fair; semisolid and viscous stool obscured some       views throughout colon; diminutive or subtle sessile polyps could easily       have been missed.      Colon otherwise normal; no other polyps, masses, vascular ectasias, or       inflammatory changes were seen. Impression:               - Preparation of the colon was fair, limiting some                            views of the colonic mucosa, as described above.                           - Internal hemorrhoids.                           - One 6 mm polyp in the transverse colon, removed  with a hot snare. Resected and retrieved.                           - Exam otherwise unremarkable. Suspect "melena" is                            more likely dark stools from dietary factors                            instead of actual GI bleeding. No source of                            abdominal pain see on today's EGD and colonoscopy,                            and no source seen on CT scan September 2017. Moderate Sedation:      N/A- Per Anesthesia Care Recommendation:           - Patient has a contact number available for                            emergencies. The signs and symptoms of potential                            delayed complications were discussed with the                            patient. Return to normal activities tomorrow.                            Written discharge instructions were provided to the                            patient.                           - Discharge patient to home (ambulatory).                           - Resume previous diet today.                           - Continue present medications.                           - Await pathology results.                           - Repeat colonoscopy in 5  years for surveillance                            based on pathology results.                           - Return to GI clinic in 2-3 months.                           -  Return to referring physician as previously                            scheduled. Procedure Code(s):        --- Professional ---                           [phone], Colonoscopy, flexible; with removal of                            tumor(s), polyp(s), or other lesion(s) by snare                            technique Diagnosis Code(s):        --- Professional ---                           K64.8, Other hemorrhoids                           D12.3, Benign neoplasm of transverse colon (hepatic                            flexure or splenic flexure)                           R10.84, Generalized abdominal pain                           K92.1, Melena (includes Hematochezia) CPT copyright 2016 American Medical Association. All rights reserved. The codes documented in this report are preliminary and upon coder review may  be revised to meet current compliance requirements. Arta Silence, MD 11/01/2016 9:17:13 AM This report has been signed electronically. Number of Addenda: 0

## 2016-11-01 NOTE — Anesthesia Preprocedure Evaluation (Signed)
Anesthesia Evaluation  Patient identified by MRN, date of birth, ID band Patient awake    Reviewed: Allergy & Precautions, NPO status , Patient's Chart, lab work & pertinent test results, reviewed documented beta blocker date and time   Airway Mallampati: II       Dental  (+) Poor Dentition, Missing, Dental Advisory Given   Pulmonary neg pulmonary ROS,    Pulmonary exam normal breath sounds clear to auscultation       Cardiovascular hypertension, Pt. on medications + Past MI and + Peripheral Vascular Disease  + dysrhythmias Atrial Fibrillation  Rhythm:Irregular Rate:Normal  ECHO 2013 EF 55%, EKG CW old MI not changed, followed regularly by cardio, AF not a candidate for anti coagulation Chronic A. Fib   Neuro/Psych Peripheral neuropathy  Neuromuscular disease negative psych ROS   GI/Hepatic GERD  Medicated and Controlled,(+)     substance abuse  alcohol use, Hepatitis -, C, A, BAbdominal pain Dark stools Status colostomy   Endo/Other  Hypothyroidism   Renal/GU Dialysis and ESRFRenal disease   Hx/o Fournier's gangrene    Musculoskeletal  (+) Arthritis , Gout   Abdominal (+) + obese,   Peds  Hematology  (+) anemia , HIV,   Anesthesia Other Findings   Reproductive/Obstetrics                             Lab Results  Component Value Date   WBC 5.2 08/01/2016   HGB 11.8 (L) 08/01/2016   HCT 37.5 (L) 08/01/2016   MCV 109.6 (H) 08/01/2016   PLT 191 08/01/2016     Chemistry      Component Value Date/Time   NA 139 08/01/2016 0954   NA 142 01/05/2016 1057   K 4.7 08/01/2016 0954   CL 96 (L) 08/01/2016 0954   CO2 26 08/01/2016 0954   BUN 62 (H) 08/01/2016 0954   BUN 39 (H) 01/05/2016 1057   CREATININE 12.33 (H) 08/01/2016 0954      Component Value Date/Time   CALCIUM 9.3 08/01/2016 0954   CALCIUM 8.7 04/11/2012 0743   ALKPHOS 46 08/01/2016 0954   AST 13 08/01/2016 0954   ALT  10 08/01/2016 0954   BILITOT 0.4 08/01/2016 0954   BILITOT 0.3 01/05/2016 1057     EKG: atrial fibrillation, rate 67. \ Anesthesia Physical  Anesthesia Plan  ASA: III  Anesthesia Plan: MAC   Post-op Pain Management:    Induction: Intravenous  Airway Management Planned: Natural Airway and Nasal Cannula  Additional Equipment:   Intra-op Plan:   Post-operative Plan:   Informed Consent: I have reviewed the patients History and Physical, chart, labs and discussed the procedure including the risks, benefits and alternatives for the proposed anesthesia with the patient or authorized representative who has indicated his/her understanding and acceptance.   Dental advisory given  Plan Discussed with: CRNA, Anesthesiologist and Surgeon  Anesthesia Plan Comments:         Anesthesia Quick Evaluation

## 2016-11-01 NOTE — Transfer of Care (Signed)
Immediate Anesthesia Transfer of Care Note  Patient: Bryan Gilmore.  Procedure(s) Performed: Procedure(s): ESOPHAGOGASTRODUODENOSCOPY (EGD) WITH PROPOFOL (N/A) COLONOSCOPY WITH PROPOFOL (N/A)  Patient Location: PACU and Endoscopy Unit  Anesthesia Type:MAC  Level of Consciousness: awake, alert , oriented and patient cooperative  Airway & Oxygen Therapy: Patient Spontanous Breathing and Patient connected to nasal cannula oxygen  Post-op Assessment: Report given to RN and Post -op Vital signs reviewed and stable  Post vital signs: Reviewed and stable  Last Vitals:  Vitals:   11/01/16 0812  BP: (!) 161/92  Pulse: 90  Temp: 36.6 C    Last Pain:  Vitals:   11/01/16 0812  TempSrc: Oral         Complications: No apparent anesthesia complications

## 2016-11-01 NOTE — H&P (Signed)
Patient interval history reviewed.  Patient examined again.  There has been no change from documented H/P dated 10/24/16 (scanned into chart from our office) except as documented above.  Assessment:  1.  Abdominal pain. 2.  Dark stools.  Plan:  1.  Endoscopy. 2.  Risks (bleeding, infection, bowel perforation that could require surgery, sedation-related changes in cardiopulmonary systems), benefits (identification and possible treatment of source of symptoms, exclusion of certain causes of symptoms), and alternatives (watchful waiting, radiographic imaging studies, empiric medical treatment) of upper endoscopy (EGD) were explained to patient/family in detail and patient wishes to proceed. 3.  Colonoscopy. 4.  Risks (bleeding, infection, bowel perforation that could require surgery, sedation-related changes in cardiopulmonary systems), benefits (identification and possible treatment of source of symptoms, exclusion of certain causes of symptoms), and alternatives (watchful waiting, radiographic imaging studies, empiric medical treatment) of colonoscopy were explained to patient/family in detail and patient wishes to proceed.

## 2016-11-01 NOTE — Anesthesia Postprocedure Evaluation (Signed)
Anesthesia Post Note  Patient: Bryan Gilmore.  Procedure(s) Performed: Procedure(s) (LRB): ESOPHAGOGASTRODUODENOSCOPY (EGD) WITH PROPOFOL (N/A) COLONOSCOPY WITH PROPOFOL (N/A)  Patient location during evaluation: PACU Anesthesia Type: MAC Level of consciousness: awake and alert and oriented Pain management: pain level controlled Vital Signs Assessment: post-procedure vital signs reviewed and stable Respiratory status: spontaneous breathing, nonlabored ventilation and respiratory function stable Cardiovascular status: stable and blood pressure returned to baseline Postop Assessment: no signs of nausea or vomiting Anesthetic complications: no       Last Vitals:  Vitals:   11/01/16 0920 11/01/16 0930  BP: 101/76 116/77  Pulse: 80 77  Resp: 20 (!) 21  Temp:      Last Pain:  Vitals:   11/01/16 0912  TempSrc: Oral                 [name] A.

## 2016-11-02 DIAGNOSIS — N186 End stage renal disease: Secondary | ICD-10-CM | POA: Diagnosis not present

## 2016-11-02 DIAGNOSIS — N2581 Secondary hyperparathyroidism of renal origin: Secondary | ICD-10-CM | POA: Diagnosis not present

## 2016-11-02 DIAGNOSIS — E1129 Type 2 diabetes mellitus with other diabetic kidney complication: Secondary | ICD-10-CM | POA: Diagnosis not present

## 2016-11-02 DIAGNOSIS — D631 Anemia in chronic kidney disease: Secondary | ICD-10-CM | POA: Diagnosis not present

## 2016-11-02 DIAGNOSIS — D509 Iron deficiency anemia, unspecified: Secondary | ICD-10-CM | POA: Diagnosis not present

## 2016-11-03 ENCOUNTER — Encounter (HOSPITAL_COMMUNITY): Payer: Self-pay | Admitting: Gastroenterology

## 2016-11-07 DIAGNOSIS — D509 Iron deficiency anemia, unspecified: Secondary | ICD-10-CM | POA: Diagnosis not present

## 2016-11-07 DIAGNOSIS — E1129 Type 2 diabetes mellitus with other diabetic kidney complication: Secondary | ICD-10-CM | POA: Diagnosis not present

## 2016-11-07 DIAGNOSIS — D631 Anemia in chronic kidney disease: Secondary | ICD-10-CM | POA: Diagnosis not present

## 2016-11-07 DIAGNOSIS — N186 End stage renal disease: Secondary | ICD-10-CM | POA: Diagnosis not present

## 2016-11-07 DIAGNOSIS — N2581 Secondary hyperparathyroidism of renal origin: Secondary | ICD-10-CM | POA: Diagnosis not present

## 2016-11-08 ENCOUNTER — Encounter: Payer: Self-pay | Admitting: Internal Medicine

## 2016-11-08 ENCOUNTER — Ambulatory Visit (INDEPENDENT_AMBULATORY_CARE_PROVIDER_SITE_OTHER): Payer: Medicare Other | Admitting: Internal Medicine

## 2016-11-08 ENCOUNTER — Telehealth: Payer: Self-pay | Admitting: *Deleted

## 2016-11-08 VITALS — BP 122/72 | HR 94 | Temp 98.1°F | Ht 71.0 in | Wt 247.0 lb

## 2016-11-08 DIAGNOSIS — D1722 Benign lipomatous neoplasm of skin and subcutaneous tissue of left arm: Secondary | ICD-10-CM

## 2016-11-08 DIAGNOSIS — N186 End stage renal disease: Secondary | ICD-10-CM | POA: Diagnosis not present

## 2016-11-08 DIAGNOSIS — D631 Anemia in chronic kidney disease: Secondary | ICD-10-CM

## 2016-11-08 DIAGNOSIS — Z992 Dependence on renal dialysis: Secondary | ICD-10-CM | POA: Diagnosis not present

## 2016-11-08 DIAGNOSIS — I252 Old myocardial infarction: Secondary | ICD-10-CM | POA: Diagnosis not present

## 2016-11-08 DIAGNOSIS — I482 Chronic atrial fibrillation, unspecified: Secondary | ICD-10-CM

## 2016-11-08 DIAGNOSIS — E034 Atrophy of thyroid (acquired): Secondary | ICD-10-CM | POA: Diagnosis not present

## 2016-11-08 DIAGNOSIS — R3916 Straining to void: Secondary | ICD-10-CM

## 2016-11-08 DIAGNOSIS — I120 Hypertensive chronic kidney disease with stage 5 chronic kidney disease or end stage renal disease: Secondary | ICD-10-CM | POA: Diagnosis not present

## 2016-11-08 DIAGNOSIS — B20 Human immunodeficiency virus [HIV] disease: Secondary | ICD-10-CM

## 2016-11-08 DIAGNOSIS — I10 Essential (primary) hypertension: Secondary | ICD-10-CM | POA: Diagnosis not present

## 2016-11-08 DIAGNOSIS — G6289 Other specified polyneuropathies: Secondary | ICD-10-CM

## 2016-11-08 DIAGNOSIS — N185 Chronic kidney disease, stage 5: Secondary | ICD-10-CM | POA: Diagnosis not present

## 2016-11-08 LAB — TSH: TSH: 0.83 m[IU]/L (ref 0.40–4.50)

## 2016-11-08 LAB — T4, FREE: FREE T4: 1 ng/dL (ref 0.8–1.8)

## 2016-11-08 NOTE — Patient Instructions (Addendum)
Follow up with cardiology, Truitt Merle NP-C/Dr Turner for CAD and history abnormal stress test  Follow up with Dr Baxter Flattery and nephrology as scheduled  continue current medications as ordered  Will call with lab results  Follow up in 3 mos for HTN, hypothyroidism

## 2016-11-08 NOTE — Telephone Encounter (Signed)
Patient states his dialysis center wants to restart vaccination series for Hepatitis B. Patient would prefer to receive these vaccines at [facility]. Please advise if this is appropriate.  He would to start as soon as possible.  He has follow up already scheduled 5/1. Landis Gandy, RN

## 2016-11-08 NOTE — Progress Notes (Signed)
Patient ID: Bryan Volker Jr., male   DOB: 04/16/1951, 65 y.o.   MRN: [id number]    Location:  [facility] Place of Service: OFFICE  Chief Complaint  Patient presents with  . Medical Management of Chronic Issues    3 months routine visit    HPI:  65 yo male seen today for f/u. He saw ID Dr Snider in early Feb 2018. Left elbow knot. No pain pain. He continues to have arthralgias. He was told by HD center that he needed to start some shot but he is unsure what and why as he left the paperwork at home  He underwent EGD and colonoscopy on 11/01/16 by Dr Outlaw. EGD showed nml esophagus. Colonoscopy revealed 1 sessile polyp 6mm that was removed. Bx revealed tubular adenoma but no dysplastic changes  skin rash - stable. He never was able to pick up clotrimazole-betamethasone cream due to cost.  He continues to itch. He is out of nystatin powder.  afib - rate controlled. Currently taking coreg  HTN - stable on losartan. Hydralazine stopped by nephro  CP/hx MI - had nuclear stress test in Dec 2016 that showed fixed defect apical and inferior wall c/w prior infarct; EF 48%. He no longer sees cardio. He is on coreg  ESRD/HD - TThSa via right forearm AVF; no issues. Takes sensipar and ferric citrate. No longer on fosrenal  Gout - no attacks since his last visit. Takes allopurinol  Joint pain/neuropathy - takes ibuprofen prn and gabapentin  HIV - followed by Dr Snider. CD4 520. Viral load quant 71; log 1.85. He is on 4 HAART meds. no longer on isentress and takes tivicay instead  Hypothyroidism - no cold intolerance/palpitations. He takes levothyroxine. TSH 0.76  Anemia of chronic disease - Hgb 11.8. Stable on ferric citrate  GERD - stable on omeprazole   Past Medical History:  Diagnosis Date  . Acute respiratory failure (HCC)   . Alcohol abuse   . Altered mental status   . Anemia   . Anemia in chronic kidney disease(285.21)   . Arthritis    "joints" (10/12/2014)  . Atrial fibrillation (HCC)     . Colostomy status (HCC)   . Cough   . Disorders of phosphorus metabolism   . Dysrhythmia    AFib, not a candidate for coumadin , dr jordan signed off on pt  . End stage renal disease (HCC)   . ESRD (end stage renal disease) on dialysis (HCC)    "TTS; [facility] " (10/12/2014)  . Fournier's gangrene   . Gangrene (HCC)   . GERD (gastroesophageal reflux disease)   . Gout   . Gouty arthritis   . Hemoptysis, unspecified   . Hepatitis A   . Hepatitis B   . Hepatitis C    "I've been treated for all of them" (10/12/2014)  . HIV (human immunodeficiency virus infection) (HCC)   . Hypertension   . Hypopotassemia   . Hypothyroidism   . Myocardial infarction    "told by a dr I had evidence of one that I didn't know about" (10/12/2014)  . Other severe protein-calorie malnutrition   . Peripheral vascular disease, unspecified    denies  . Sebaceous cyst   . Sepsis(995.91)   . Sepsis(995.91)   . Severe protein-calorie malnutrition (HCC)   . Ulcer of lower limb, unspecified   . Ulcer of lower limb, unspecified   . Unspecified constipation     Past Surgical History:  Procedure Laterality Date  .   AV FISTULA PLACEMENT  04/12/2012   Procedure: INSERTION OF ARTERIOVENOUS (AV) GORE-TEX GRAFT ARM;  Surgeon: Rosetta Posner, MD;  Location: [facility] OR;  Service: Vascular;  Laterality: Left;  insertion of 6x50 stretch goretex graft   . AV FISTULA PLACEMENT  08/14/2012   Procedure: INSERTION OF ARTERIOVENOUS (AV) GORE-TEX GRAFT ARM;  Surgeon: Mal Misty, MD;  Location: [place];  Service: Vascular;  Laterality: Left;  . AV FISTULA PLACEMENT Right 11/08/2012   Procedure: ARTERIOVENOUS (AV) FISTULA CREATION;  Surgeon: Angelia Mould, MD;  Location: [place];  Service: Vascular;  Laterality: Right;  . BOWEL RESECTION N/A 02/21/2013   Procedure: SMALL BOWEL RESECTION;  Surgeon: [name], MD;  Location: [place];  Service: General;  Laterality: N/A;  . COLONOSCOPY WITH PROPOFOL N/A 11/01/2016    Procedure: COLONOSCOPY WITH PROPOFOL;  Surgeon: Arta Silence, MD;  Location: [facility];  Service: Endoscopy;  Laterality: N/A;  . COLOSTOMY  02/2012   d/t an infection  . COLOSTOMY  13  . COLOSTOMY CLOSURE N/A 02/21/2013   Procedure: COLOSTOMY CLOSURE;  Surgeon: [name], MD;  Location: [place];  Service: General;  Laterality: N/A;  takedown of sigmoid colostomy with anastomosis  . CYSTOSCOPY  07/30/2012   Procedure: CYSTOSCOPY;  Surgeon: Reece Packer, MD;  Location: [facility];  Service: Urology;  Laterality: Bilateral;  retrograde  . CYSTOSCOPY WITH URETHRAL DILATATION  02/17/2012   Procedure: CYSTOSCOPY WITH URETHRAL DILATATION;  Surgeon: Reece Packer, MD;  Location: [facility];  Service: Urology;  Laterality: N/A;  retrograde urethragram, placement of foley catheter exam under anesthesia   . ESOPHAGOGASTRODUODENOSCOPY (EGD) WITH PROPOFOL N/A 11/01/2016   Procedure: ESOPHAGOGASTRODUODENOSCOPY (EGD) WITH PROPOFOL;  Surgeon: Arta Silence, MD;  Location: [facility];  Service: Endoscopy;  Laterality: N/A;  . FISTULOGRAM Right 01/06/2013   Procedure: FISTULOGRAM;  Surgeon: Angelia Mould, MD;  Location: [facility] CATH LAB;  Service: Cardiovascular;  Laterality: Right;  . HERNIA REPAIR    . INSERTION OF DIALYSIS CATHETER  02/20/2012   Procedure: INSERTION OF DIALYSIS CATHETER;  Surgeon: Angelia Mould, MD;  Location: [place];  Service: Vascular;  Laterality: N/A;  . INSERTION OF DIALYSIS CATHETER  03/29/2012   Procedure: INSERTION OF DIALYSIS CATHETER;  Surgeon: Rosetta Posner, MD;  Location: [facility] OR;  Service: Vascular;  Laterality: N/A;  Insertion tunneled dialysis catheter right IJ  . INSERTION OF DIALYSIS CATHETER  08/04/2012   Procedure: INSERTION OF DIALYSIS CATHETER;  Surgeon: Angelia Mould, MD;  Location: [place];  Service: Vascular;  Laterality: Right;  right intrajugular hemodialysis catheter insertion  . INSERTION OF MESH N/A 10/12/2014   Procedure: INSERTION OF  MESH;  Surgeon: Excell Seltzer, MD;  Location: [place];  Service: General;  Laterality: N/A;  . IRRIGATION AND DEBRIDEMENT ABSCESS  02/29/2012   Procedure: IRRIGATION AND DEBRIDEMENT ABSCESS;  Surgeon: Reece Packer, MD;  Location: [place];  Service: Urology;  Laterality: N/A;  I&D of scrotum abcess  . Surgery for Fournier's gangrene  02/2012  . THROMBECTOMY AND REVISION OF ARTERIOVENTOUS (AV) GORETEX  GRAFT  07/15/2012   Procedure: THROMBECTOMY AND REVISION OF ARTERIOVENTOUS (AV) GORETEX  GRAFT;  Surgeon: Elam Dutch, MD;  Location: [place];  Service: Vascular;  Laterality: Left;  . URETHROGRAM  07/30/2012   Procedure: URETHROGRAM;  Surgeon: Reece Packer, MD;  Location: [facility];  Service: Urology;  Laterality: N/A;  cystogram  . VENTRAL HERNIA REPAIR  10/12/2014  . VENTRAL HERNIA REPAIR N/A 10/12/2014  Procedure: REPAIR VENTRAL INCISIONAL HERNIA;  Surgeon: Benjamin Hoxworth, MD;  Location: [facility] OR;  Service: General;  Laterality: N/A;    Patient Care Team:  , DO as PCP - General (Internal Medicine) Robert W Comer, MD as PCP - Infectious Diseases (Infectious Diseases) Adriana Snyder, RT as Technician Kellie Goldsborough, MD as Consulting Physician (Nephrology) William Outlaw, MD as Consulting Physician (Gastroenterology)  Social History   Social History  . Marital status: Legally Separated    Spouse name: N/A  . Number of children: 0  . Years of education: N/A   Occupational History  . Custodian [employer]   Social History Main Topics  . Smoking status: Never Smoker  . Smokeless tobacco: Never Used  . Alcohol use Yes     Comment: occasional   . Drug use: No  . Sexual activity: No     Comment: declined condoms   Other Topics Concern  . Not on file   Social History Narrative   Single.  Unemployed.  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       reports that he has never smoked. He has never used smokeless tobacco. He reports that he drinks  alcohol. He reports that he does not use drugs.  Family History  Problem Relation Age of Onset  . Hypertension Mother   . Hypertension Father   . Cancer Cousin     breast   Family Status  Relation Status  . Mother Deceased   Cause of Death: Gunshot wound  . Father Deceased   Cause of Death: Natural causes  . Sister Alive  . Brother Alive  . Daughter Alive  . Sister Alive  . Sister Alive  . Sister Alive  . Sister Alive  . Daughter Alive  . Cousin Deceased     No Known Allergies  Medications: Patient's Medications  New Prescriptions   No medications on file  Previous Medications   ALLOPURINOL (ZYLOPRIM) 300 MG TABLET    TAKE ONE TABLET BY MOUTH ONCE DAILY FOR GOUT   AURYXIA 1 GM 210 MG(FE) TABLET    Take 420mgs with every meal and 210mgs with every snack   CARVEDILOL (COREG) 3.125 MG TABLET    TAKE 1 TABLET BY MOUTH 2 TIMES DAILY WITH A MEAL   CINACALCET (SENSIPAR) 30 MG TABLET    Take 30 mg by mouth 3 (three) times a week.    DOLUTEGRAVIR (TIVICAY) 50 MG TABLET    Take 1 tablet (50 mg total) by mouth daily.   GABAPENTIN (NEURONTIN) 300 MG CAPSULE    Take 1 cap po BID and 2 caps po qhs   IBUPROFEN (ADVIL,MOTRIN) 800 MG TABLET    Take 800 mg by mouth every 8 (eight) hours as needed for headache or moderate pain.   LAMIVUDINE (EPIVIR) 100 MG TABLET    TAKE 1/2 TABLET BY MOUTH EVERY DAY   LEVOTHYROXINE (SYNTHROID, LEVOTHROID) 125 MCG TABLET    TAKE 1 TABLET(125 MCG) BY MOUTH DAILY BEFORE BREAKFAST   LOSARTAN (COZAAR) 50 MG TABLET    Take 50 mg by mouth daily.    OMEPRAZOLE (PRILOSEC) 20 MG CAPSULE    Take 20 mg by mouth daily.   PREZCOBIX 800-150 MG TABLET    TAKE 1 TABLET BY MOUTH EVERY DAY WITH FOOD   ZIDOVUDINE (RETROVIR) 300 MG TABLET    TAKE 1 TABLET BY MOUTH EVERY DAY  Modified Medications   No medications on file  Discontinued Medications     No medications on file    Review of Systems  HENT: Positive for hearing loss.   Gastrointestinal: Negative for abdominal  pain.  Genitourinary: Positive for difficulty urinating (straining occasionally). Negative for decreased urine volume, dysuria, flank pain, frequency and urgency.  Musculoskeletal: Positive for arthralgias.  Skin: Positive for rash (intermittent).  All other systems reviewed and are negative.   Vitals:   11/08/16 0858  BP: 122/72  Pulse: 94  Temp: 98.1 F (36.7 C)  TempSrc: Oral  SpO2: 99%  Weight: 247 lb (112 kg)  Height: 5' 11" (1.803 m)   Body mass index is 34.45 kg/m.  Physical Exam  Constitutional: He is oriented to person, place, and time. He appears well-developed and well-nourished. No distress.  HENT:  Mouth/Throat: Oropharynx is clear and moist. No oropharyngeal exudate.  TMs intact; no cerumen impaction. No redness or d/c  Eyes: Pupils are equal, round, and reactive to light. No scleral icterus.  Neck: Neck supple. Carotid bruit is not present. No thyromegaly present.  Cardiovascular: Normal rate and intact distal pulses.  An irregularly irregular rhythm present. Exam reveals no gallop and no friction rub.   Murmur heard.  Systolic murmur is present with a grade of 1/6  no distal LE swelling. No calf TTP. Right forearm AVF palpable thrill/audible bruit  Pulmonary/Chest: Effort normal and breath sounds normal. He has no wheezes. He has no rhonchi. He has no rales.  Abdominal: Soft. Bowel sounds are normal. He exhibits distension. He exhibits no abdominal bruit, no pulsatile midline mass and no mass. There is tenderness (right midepigastric). There is no rebound and no guarding.  Musculoskeletal: He exhibits edema and tenderness.  Grape sized freely mobile left elbow lipoma, NT and no redness  Lymphadenopathy:    He has no cervical adenopathy.    He has no axillary adenopathy.       Right axillary: No pectoral and no lateral adenopathy present.       Left axillary: No pectoral adenopathy present. Neurological: He is alert and oriented to person, place, and time.    Skin: Skin is warm and dry. No rash noted.  Psychiatric: He has a normal mood and affect. His behavior is normal. Judgment and thought content normal.     Labs reviewed: Lab on 09/25/2016  Component Date Value Ref Range Status  . HIV 1 RNA Quant 09/25/2016 71* <20 copies/mL Final  . HIV1 RNA Quant, Log 09/25/2016 1.85* <1.30 Log copies/mL Final   Comment: This test was performed using the COBAS(R) AmpliPrep/ COBAS(R) TaqMan(R) HIV-1 test kit version 2.0    . CD4 T Cell Abs 09/25/2016 520  400 - 2,700 /uL Final  . CD4 % Helper T Cell 09/25/2016 36  33 - 55 % Final    No results found.   Assessment/Plan   ICD-9-CM ICD-10-CM   1. Urinary straining 788.65 R39.16 Urinalysis with Reflex Microscopic  2. Lipoma of left upper extremity 214.8 D17.22   3. Hypothyroidism due to acquired atrophy of thyroid 244.8 E03.4 TSH   246.8  T4, Free  4. History of MI (myocardial infarction) 412 I25.2   5. Other polyneuropathy ([place]) 357.89 G62.89   6. ESRD on dialysis ([place]) 585.6 N18.6    V45.11 Z99.2   7. Anemia of chronic renal failure, stage 5 (HCC) 285.21 N18.5    585.5 D63.1   8. Human immunodeficiency virus (HIV) disease ([place]) 042 B20   9. Essential hypertension 401.9 I10   10. Chronic atrial fibrillation (HCC) 427.31 I48.2  Reassurance given for arm lipoma - it is benign  Follow up with cardiology, Truitt Merle NP-C/Dr Turner for CAD and history abnormal stress test - REFERRAL PLACED  Follow up with Dr Baxter Flattery and nephrology as scheduled  continue current medications as ordered  Will call with lab results  S/w HD center by phone - pt with low Hep B surface Ab's and will need to start Hep series of 4 injections. Pt encouraged to start the series due to immune status and need to protect against Hep B  Follow up in 3 mos for HTN, hypothyroidism    Kaytlin Burklow S. Perlie Gold  [facility] [address] [place], [place]  [address] [phone] Cell (Monday-Friday 8 AM - 5 PM) [phone] After 5 PM and follow prompts

## 2016-11-09 DIAGNOSIS — D509 Iron deficiency anemia, unspecified: Secondary | ICD-10-CM | POA: Diagnosis not present

## 2016-11-09 DIAGNOSIS — N186 End stage renal disease: Secondary | ICD-10-CM | POA: Diagnosis not present

## 2016-11-09 DIAGNOSIS — D631 Anemia in chronic kidney disease: Secondary | ICD-10-CM | POA: Diagnosis not present

## 2016-11-09 DIAGNOSIS — N2581 Secondary hyperparathyroidism of renal origin: Secondary | ICD-10-CM | POA: Diagnosis not present

## 2016-11-09 DIAGNOSIS — E1129 Type 2 diabetes mellitus with other diabetic kidney complication: Secondary | ICD-10-CM | POA: Diagnosis not present

## 2016-11-09 LAB — URINALYSIS, MICROSCOPIC ONLY
CASTS: NONE SEEN [LPF]
Crystals: NONE SEEN [HPF]
Yeast: NONE SEEN [HPF]

## 2016-11-09 LAB — URINALYSIS, ROUTINE W REFLEX MICROSCOPIC
BILIRUBIN URINE: NEGATIVE
Glucose, UA: NEGATIVE
KETONES UR: NEGATIVE
NITRITE: NEGATIVE
SPECIFIC GRAVITY, URINE: 1.016 (ref 1.001–1.035)
pH: 7 (ref 5.0–8.0)

## 2016-11-10 LAB — URINE CULTURE: ORGANISM ID, BACTERIA: NO GROWTH

## 2016-11-11 DIAGNOSIS — N2581 Secondary hyperparathyroidism of renal origin: Secondary | ICD-10-CM | POA: Diagnosis not present

## 2016-11-11 DIAGNOSIS — E1129 Type 2 diabetes mellitus with other diabetic kidney complication: Secondary | ICD-10-CM | POA: Diagnosis not present

## 2016-11-11 DIAGNOSIS — D631 Anemia in chronic kidney disease: Secondary | ICD-10-CM | POA: Diagnosis not present

## 2016-11-11 DIAGNOSIS — N186 End stage renal disease: Secondary | ICD-10-CM | POA: Diagnosis not present

## 2016-11-11 DIAGNOSIS — D509 Iron deficiency anemia, unspecified: Secondary | ICD-10-CM | POA: Diagnosis not present

## 2016-11-13 MED FILL — GABAPENTIN 300 MG CAPSULE: 300 | 30 days supply | Qty: 120 | Fill #4

## 2016-11-13 NOTE — Telephone Encounter (Signed)
Patient advised he was told he has to have the series again but if he does he wants it here. Advised will ask the doctor and get back to him.

## 2016-11-13 NOTE — Telephone Encounter (Signed)
Appointments made with patient.

## 2016-11-13 NOTE — Telephone Encounter (Signed)
OK to restart here per Dr Baxter Flattery. Left message for patient. He can start the series in April, follow with the 2nd dose at his visit in May.  He needs to call back and schedule the 1st injection around his dialysis schedule. Landis Gandy, RN

## 2016-11-15 ENCOUNTER — Telehealth: Payer: Self-pay

## 2016-11-15 DIAGNOSIS — R3916 Straining to void: Secondary | ICD-10-CM

## 2016-11-15 NOTE — Telephone Encounter (Signed)
Patient called to get the status of his referral to  Urology. I informed patient per notes from Jannet Mantis on 11/10/16 he is established with [facility] and can call to schedule his own appointment. Patient states he would like a different urologist. Patient aware this will be in another city and he is ok with that.Patient states [facility] messed up my life and Dr.Carter know's what I mean by that.   I asked patient for more clarification and patient states " I am not sexually active as a results of going to [facility]."  Dr.Carter please advise

## 2016-11-15 NOTE — Telephone Encounter (Signed)
Ok to refer to different urology office for 2nd opinion

## 2016-11-16 DIAGNOSIS — D509 Iron deficiency anemia, unspecified: Secondary | ICD-10-CM | POA: Diagnosis not present

## 2016-11-16 DIAGNOSIS — N186 End stage renal disease: Secondary | ICD-10-CM | POA: Diagnosis not present

## 2016-11-16 DIAGNOSIS — N2581 Secondary hyperparathyroidism of renal origin: Secondary | ICD-10-CM | POA: Diagnosis not present

## 2016-11-16 DIAGNOSIS — D631 Anemia in chronic kidney disease: Secondary | ICD-10-CM | POA: Diagnosis not present

## 2016-11-16 DIAGNOSIS — E1129 Type 2 diabetes mellitus with other diabetic kidney complication: Secondary | ICD-10-CM | POA: Diagnosis not present

## 2016-11-16 NOTE — Telephone Encounter (Signed)
Referral placed.

## 2016-11-17 IMAGING — US US ABDOMEN COMPLETE
1 series · 14 of 25 positions shown · non-contrast
Comparison: 06/10/2014

CLINICAL DATA: Cirrhosis of the liver

EXAM:
ULTRASOUND ABDOMEN COMPLETE

[Series 1: us abdomen complete · 0.33mm/px · 14 of 78 slices shown]
[im 1/78]
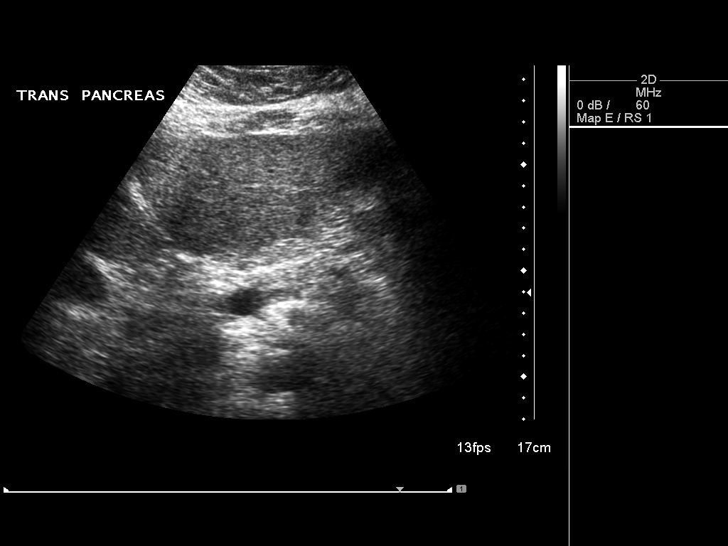
[im 7/78]
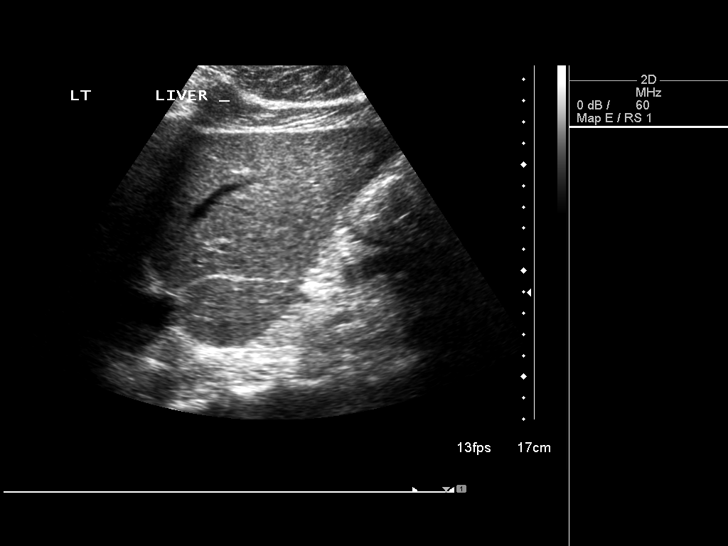
[im 13/78]
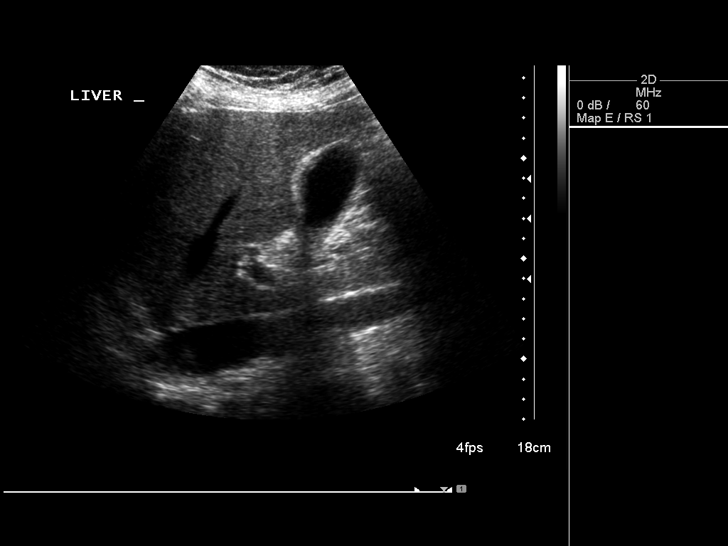
[im 20/78]
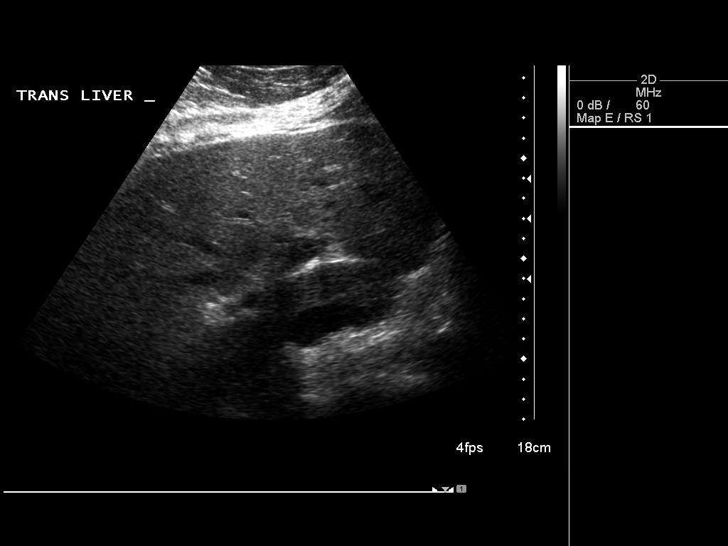
[im 26/78]
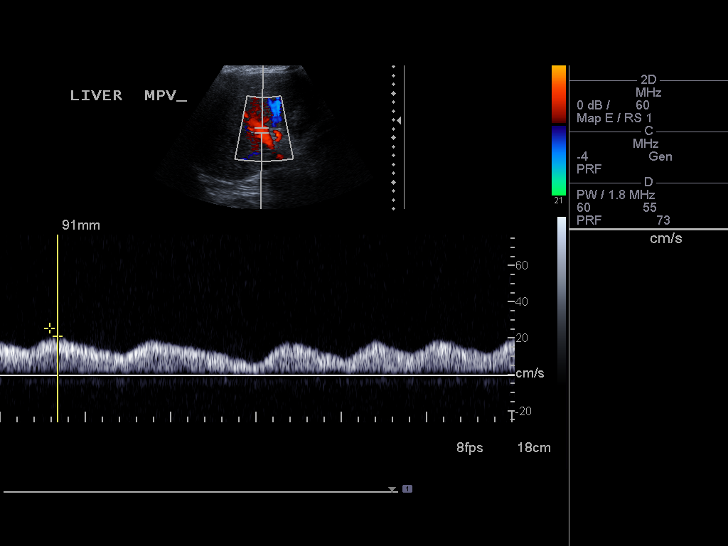
[im 29/78]
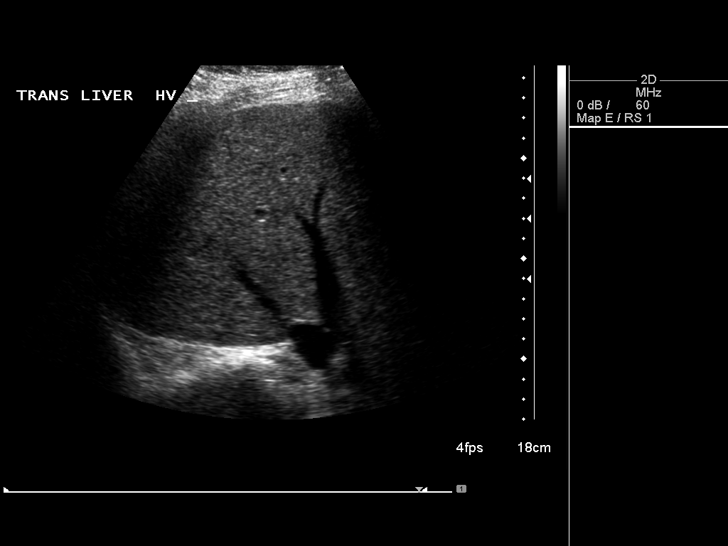
[im 36/78]
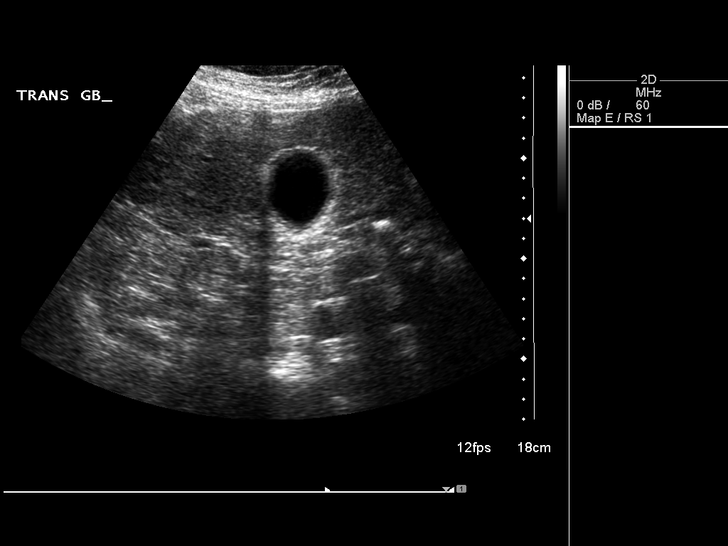
[im 42/78]
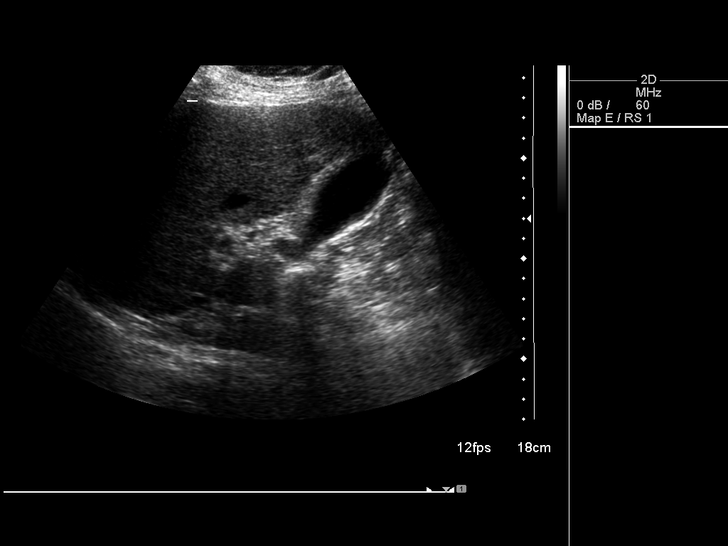
[im 49/78]
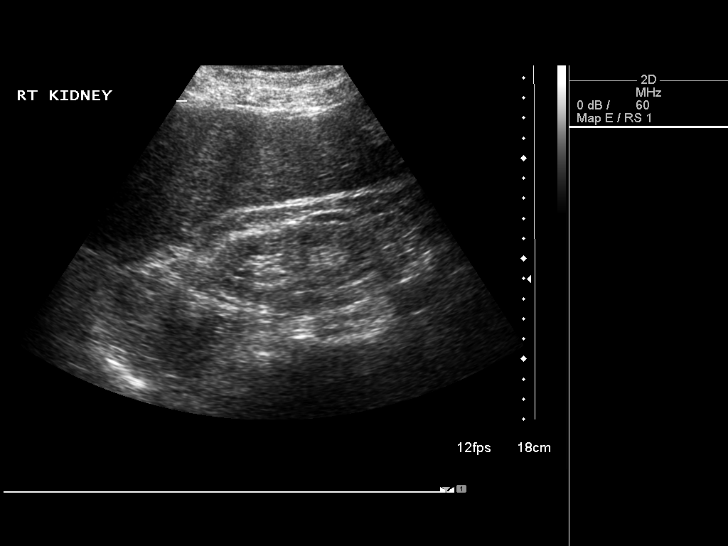
[im 52/78]
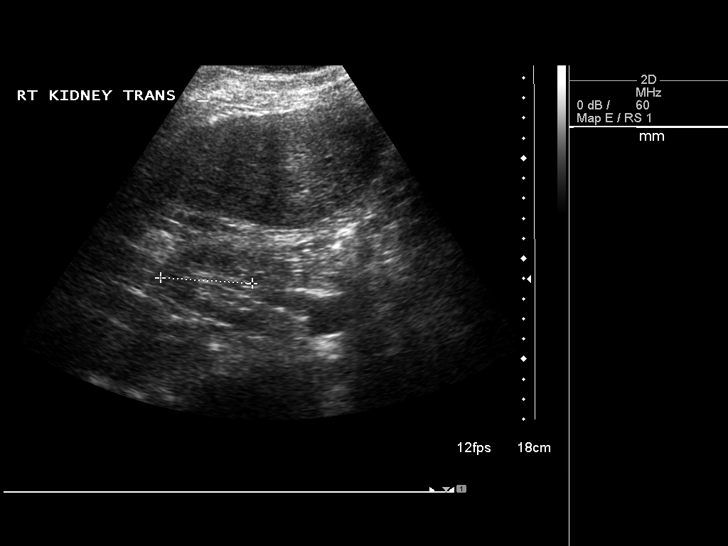
[im 58/78]
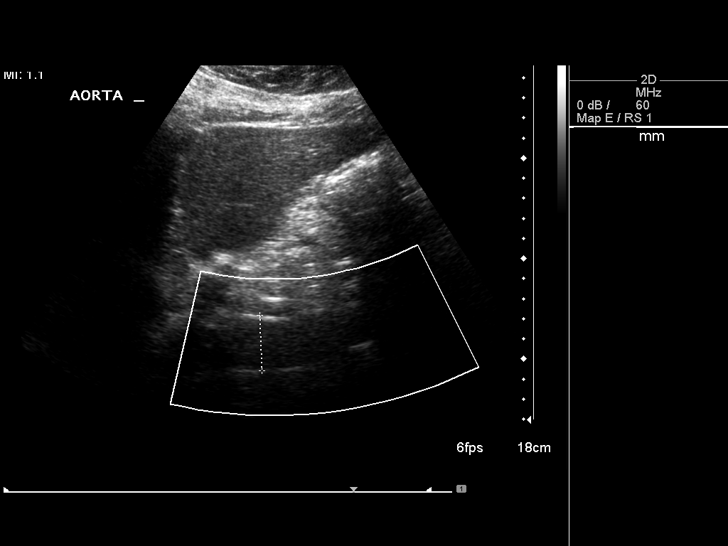
[im 65/78]
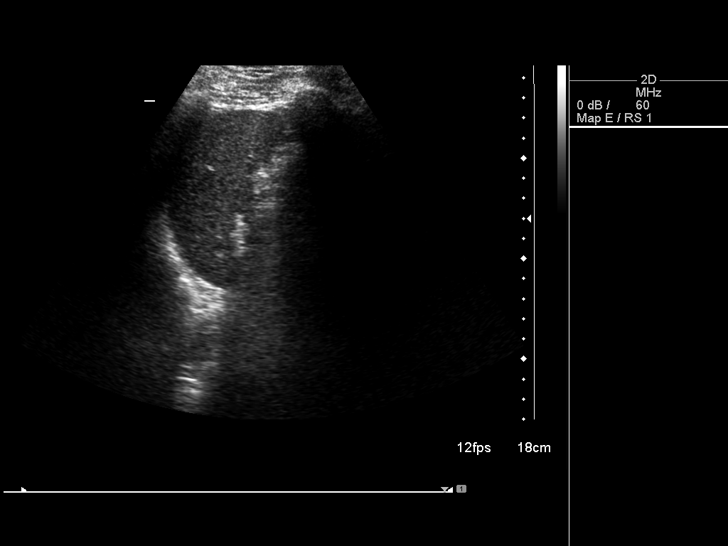
[im 71/78]
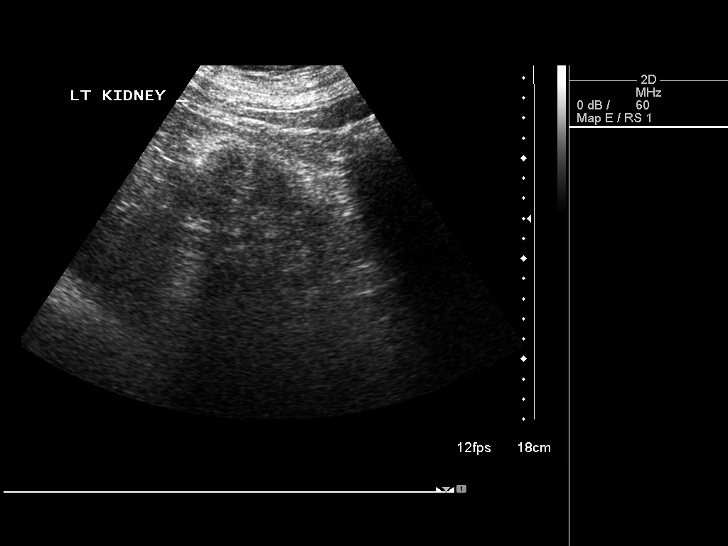
[im 78/78]
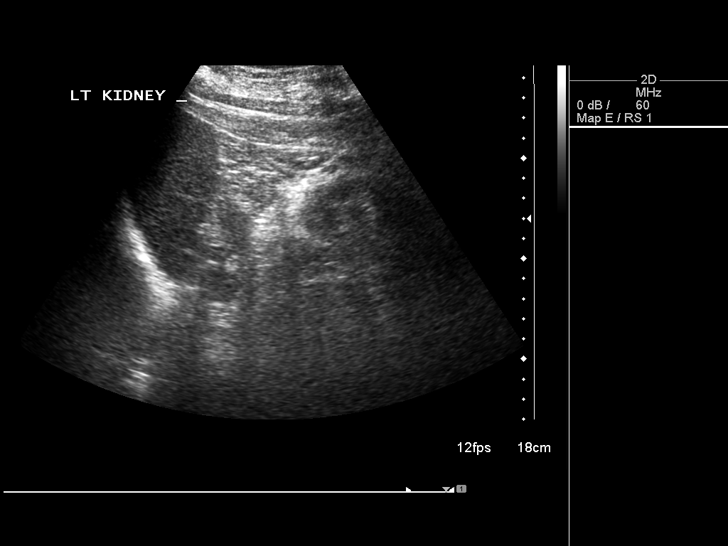

[14 of 25 positions shown; findings below may reference images not displayed]

FINDINGS: Gallbladder: The gallbladder wall is thickened measuring 4.5 mm. No
gallstones. Negative sonographic Murphy's sign.

Common bile duct: Diameter: 4 mm

Liver: The liver is heterogeneous with a nodular contour or. No
focal mass identified.

IVC: No abnormality visualized.

Pancreas: Visualized portion unremarkable.

Spleen: Size and appearance within normal limits.

Right Kidney: Length: 8.9 cm. Increased parenchymal echogenicity and
diffuse thinning of the cortex.. No mass or hydronephrosis.

Left Kidney: Length: 8.2 cm. Increased parenchymal echogenicity with
diffuse thinning of the cortex. No mass or hydronephrosis.

Abdominal aorta: Measures 2.8 cm.

Other findings: None.
IMPRESSION: 1. Morphologic features of liver compatible with cirrhosis.
2. Gallbladder wall thickening is a nonspecific finding in the
setting of cirrhosis.

## 2016-11-18 DIAGNOSIS — E1129 Type 2 diabetes mellitus with other diabetic kidney complication: Secondary | ICD-10-CM | POA: Diagnosis not present

## 2016-11-18 DIAGNOSIS — N2581 Secondary hyperparathyroidism of renal origin: Secondary | ICD-10-CM | POA: Diagnosis not present

## 2016-11-18 DIAGNOSIS — D509 Iron deficiency anemia, unspecified: Secondary | ICD-10-CM | POA: Diagnosis not present

## 2016-11-18 DIAGNOSIS — D631 Anemia in chronic kidney disease: Secondary | ICD-10-CM | POA: Diagnosis not present

## 2016-11-18 DIAGNOSIS — N186 End stage renal disease: Secondary | ICD-10-CM | POA: Diagnosis not present

## 2016-11-20 ENCOUNTER — Encounter: Payer: Self-pay | Admitting: Nurse Practitioner

## 2016-11-20 ENCOUNTER — Ambulatory Visit (INDEPENDENT_AMBULATORY_CARE_PROVIDER_SITE_OTHER): Payer: Medicare Other | Admitting: Nurse Practitioner

## 2016-11-20 VITALS — BP 170/100 | HR 65 | Ht 71.0 in | Wt 248.8 lb

## 2016-11-20 DIAGNOSIS — I4821 Permanent atrial fibrillation: Secondary | ICD-10-CM

## 2016-11-20 DIAGNOSIS — N186 End stage renal disease: Secondary | ICD-10-CM | POA: Diagnosis not present

## 2016-11-20 DIAGNOSIS — I482 Chronic atrial fibrillation: Secondary | ICD-10-CM | POA: Diagnosis not present

## 2016-11-20 DIAGNOSIS — R0789 Other chest pain: Secondary | ICD-10-CM | POA: Diagnosis not present

## 2016-11-20 DIAGNOSIS — I10 Essential (primary) hypertension: Secondary | ICD-10-CM | POA: Diagnosis not present

## 2016-11-20 NOTE — Progress Notes (Signed)
CARDIOLOGY OFFICE NOTE  Date:  11/20/2016    Bryan Gilmore. Date of Birth: [birth year] Medical Record #[id number]  PCP:  Gildardo Cranker, DO  Cardiologist:  Gillian Shields  Chief Complaint  Patient presents with  . Atrial Fibrillation    Follow up visit - seen for Dr. Johnsie Cancel    History of Present Illness: Bryan Gilmore. is a 66 y.o. male who presents today for a follow up visit.   Seen for Dr. Johnsie Cancel but has also seen Dr. [name] in the past back in 2013 initially. Has not been seen here since November of 2016.   He has a significant PMH of chronic atrial fib -not a good candidate for anticoagulation due to comorbidities. He has ESRD, status post surgery in June of 2013 for Fournier's gangrene with debridement. He also had a Hartmann pouch with end colostomy.  Patient does have a history of hypertension and HIV positivity. He also has a history of hepatitis C and severe protein calorie malnutrition. His AF was noted back in 2013 while hospitalized. Last stress test back in 2016 showing fixed defect apical and inferior wall c/w prior infarct - EF of 48%.   At last visit with me - chronic chest pain - seemed more related to a shoulder issue - this is when we got a Myoview - reviewed with Dr. [name] - opted for medical management.   He has had EGD and colonoscopy last month - had one polyp removed. Referred back here by PCP.   Comes in today. Here alone. He says he is doing ok for the most part. Not dizzy or lightheaded. No problems with his dialysis. BP seems to be holding. He is short of breath with activity. He now notes he has some chest tightness with walking - typically has with just walking a block or so. He is pretty sedentary for the most part. This is different from his shoulder pain that he was having previously. Taking his medicines. Asking about his last stress test results.  Labs are done by PCP and nephrology.   Past Medical History:  Diagnosis Date  . Acute  respiratory failure ([place])   . Alcohol abuse   . Altered mental status   . Anemia   . Anemia in chronic kidney disease(285.21)   . Arthritis    "joints" (10/12/2014)  . Atrial fibrillation ([place])   . Colostomy status ([place])   . Cough   . Disorders of phosphorus metabolism   . Dysrhythmia    AFib, not a candidate for coumadin , dr [name] signed off on pt  . End stage renal disease ([place])   . ESRD (end stage renal disease) on dialysis ([place])    "TTS; [facility] " (10/12/2014)  . Fournier's gangrene   . Gangrene ([place])   . GERD (gastroesophageal reflux disease)   . Gout   . Gouty arthritis   . Hemoptysis, unspecified   . Hepatitis A   . Hepatitis B   . Hepatitis C    "I've been treated for all of them" (10/12/2014)  . HIV (human immunodeficiency virus infection) ([place])   . Hypertension   . Hypopotassemia   . Hypothyroidism   . Myocardial infarction    "told by a dr I had evidence of one that I didn't know about" (10/12/2014)  . Other severe protein-calorie malnutrition   . Peripheral vascular disease, unspecified    denies  . Sebaceous cyst   . Sepsis(995.91)   .  Sepsis(995.91)   . Severe protein-calorie malnutrition ([place])   . Ulcer of lower limb, unspecified   . Ulcer of lower limb, unspecified   . Unspecified constipation     Past Surgical History:  Procedure Laterality Date  . AV FISTULA PLACEMENT  04/12/2012   Procedure: INSERTION OF ARTERIOVENOUS (AV) GORE-TEX GRAFT ARM;  Surgeon: Rosetta Posner, MD;  Location: [facility] OR;  Service: Vascular;  Laterality: Left;  insertion of 6x50 stretch goretex graft   . AV FISTULA PLACEMENT  08/14/2012   Procedure: INSERTION OF ARTERIOVENOUS (AV) GORE-TEX GRAFT ARM;  Surgeon: Mal Misty, MD;  Location: [place];  Service: Vascular;  Laterality: Left;  . AV FISTULA PLACEMENT Right 11/08/2012   Procedure: ARTERIOVENOUS (AV) FISTULA CREATION;  Surgeon: Angelia Mould, MD;  Location: [place];  Service: Vascular;  Laterality: Right;  .  BOWEL RESECTION N/A 02/21/2013   Procedure: SMALL BOWEL RESECTION;  Surgeon: [name], MD;  Location: [place];  Service: General;  Laterality: N/A;  . COLONOSCOPY WITH PROPOFOL N/A 11/01/2016   Procedure: COLONOSCOPY WITH PROPOFOL;  Surgeon: Arta Silence, MD;  Location: [facility];  Service: Endoscopy;  Laterality: N/A;  . COLOSTOMY  02/2012   d/t an infection  . COLOSTOMY  13  . COLOSTOMY CLOSURE N/A 02/21/2013   Procedure: COLOSTOMY CLOSURE;  Surgeon: [name], MD;  Location: [place];  Service: General;  Laterality: N/A;  takedown of sigmoid colostomy with anastomosis  . CYSTOSCOPY  07/30/2012   Procedure: CYSTOSCOPY;  Surgeon: Reece Packer, MD;  Location: [facility];  Service: Urology;  Laterality: Bilateral;  retrograde  . CYSTOSCOPY WITH URETHRAL DILATATION  02/17/2012   Procedure: CYSTOSCOPY WITH URETHRAL DILATATION;  Surgeon: Reece Packer, MD;  Location: [facility];  Service: Urology;  Laterality: N/A;  retrograde urethragram, placement of foley catheter exam under anesthesia   . ESOPHAGOGASTRODUODENOSCOPY (EGD) WITH PROPOFOL N/A 11/01/2016   Procedure: ESOPHAGOGASTRODUODENOSCOPY (EGD) WITH PROPOFOL;  Surgeon: Arta Silence, MD;  Location: [facility];  Service: Endoscopy;  Laterality: N/A;  . FISTULOGRAM Right 01/06/2013   Procedure: FISTULOGRAM;  Surgeon: Angelia Mould, MD;  Location[facility] CATH LAB;  Service: Cardiovascular;  Laterality: Right;  . HERNIA REPAIR    . INSERTION OF DIALYSIS CATHETER  02/20/2012   Procedure: INSERTION OF DIALYSIS CATHETER;  Surgeon: Angelia Mould, MD;  Location: [place];  Service: Vascular;  Laterality: N/A;  . INSERTION OF DIALYSIS CATHETER  03/29/2012   Procedure: INSERTION OF DIALYSIS CATHETER;  Surgeon: Rosetta Posner, MD;  [facility];  Service: Vascular;  Laterality: N/A;  Insertion tunneled dialysis catheter right IJ  . INSERTION OF DIALYSIS CATHETER  08/04/2012   Procedure: INSERTION OF DIALYSIS CATHETER;  Surgeon:  Angelia Mould, MD;  Location: [place];  Service: Vascular;  Laterality: Right;  right intrajugular hemodialysis catheter insertion  . INSERTION OF MESH N/A 10/12/2014   Procedure: INSERTION OF MESH;  Surgeon: Excell Seltzer, MD;  Location: [place];  Service: General;  Laterality: N/A;  . IRRIGATION AND DEBRIDEMENT ABSCESS  02/29/2012   Procedure: IRRIGATION AND DEBRIDEMENT ABSCESS;  Surgeon: Reece Packer, MD;  Location: [place];  Service: Urology;  Laterality: N/A;  I&D of scrotum abcess  . Surgery for Fournier's gangrene  02/2012  . THROMBECTOMY AND REVISION OF ARTERIOVENTOUS (AV) GORETEX  GRAFT  07/15/2012   Procedure: THROMBECTOMY AND REVISION OF ARTERIOVENTOUS (AV) GORETEX  GRAFT;  Surgeon: Elam Dutch, MD;  Location: [place];  Service: Vascular;  Laterality:  Left;  . URETHROGRAM  07/30/2012   Procedure: URETHROGRAM;  Surgeon: Reece Packer, MD;  Location[facility];  Service: Urology;  Laterality: N/A;  cystogram  . VENTRAL HERNIA REPAIR  10/12/2014  . VENTRAL HERNIA REPAIR N/A 10/12/2014   Procedure: REPAIR VENTRAL INCISIONAL HERNIA;  Surgeon: Excell Seltzer, MD;  Location: [place];  Service: General;  Laterality: N/A;     Medications: Current Outpatient Prescriptions  Medication Sig Dispense Refill  . allopurinol (ZYLOPRIM) 300 MG tablet TAKE ONE TABLET BY MOUTH ONCE DAILY FOR GOUT 30 tablet 0  . AURYXIA 1 GM 210 MG(Fe) tablet Take 420mg s with every meal and 210mg s with every snack    . carvedilol (COREG) 3.125 MG tablet TAKE 1 TABLET BY MOUTH 2 TIMES DAILY WITH A MEAL 60 tablet 3  . cinacalcet (SENSIPAR) 30 MG tablet Take 30 mg by mouth 3 (three) times a week.     . dolutegravir (TIVICAY) 50 MG tablet Take 1 tablet (50 mg total) by mouth daily. 30 tablet 11  . gabapentin (NEURONTIN) 300 MG capsule Take 1 cap po BID and 2 caps po qhs 120 capsule 6  . ibuprofen (ADVIL,MOTRIN) 800 MG tablet Take 800 mg by mouth every 8 (eight) hours as needed for headache or moderate pain.      Marland Kitchen lamivudine (EPIVIR) 100 MG tablet TAKE 1/2 TABLET BY MOUTH EVERY DAY 15 tablet 4  . levothyroxine (SYNTHROID, LEVOTHROID) 125 MCG tablet TAKE 1 TABLET(125 MCG) BY MOUTH DAILY BEFORE BREAKFAST 30 tablet 3  . losartan (COZAAR) 50 MG tablet Take 50 mg by mouth daily.     Marland Kitchen omeprazole (PRILOSEC) 20 MG capsule Take 20 mg by mouth daily.    Marland Kitchen PREZCOBIX 800-150 MG tablet TAKE 1 TABLET BY MOUTH EVERY DAY WITH FOOD 30 tablet 3  . zidovudine (RETROVIR) 300 MG tablet TAKE 1 TABLET BY MOUTH EVERY DAY 30 tablet 5   No current facility-administered medications for this visit.     Allergies: No Known Allergies  Social History: The patient  reports that he has never smoked. He has never used smokeless tobacco. He reports that he drinks alcohol. He reports that he does not use drugs.   Family History: The patient's family history includes Cancer in his cousin; Hypertension in his father and mother.   Review of Systems: Please see the history of present illness.   Otherwise, the review of systems is positive for none.   All other systems are reviewed and negative.   Physical Exam: VS:  BP (!) 170/100   Pulse 65   Ht 5\' 11"  (1.803 m)   Wt 248 lb 12.8 oz (112.9 kg)   BMI 34.70 kg/m  .  BMI Body mass index is 34.7 kg/m.  Wt Readings from Last 3 Encounters:  11/20/16 248 lb 12.8 oz (112.9 kg)  11/08/16 247 lb (112 kg)  11/01/16 252 lb (114.3 kg)   Recheck BP by me is 130/80.  General: Obese black male. He is alert and in no acute distress.   HEENT: Normal.  Neck: Supple, no JVD, carotid bruits, or masses noted.  Cardiac: Irregular irregular rhythm. His rate is fine. No edema.  Respiratory:  Lungs are clear to auscultation bilaterally with normal work of breathing.  GI: Soft and nontender.  MS: No deformity or atrophy. Gait and ROM intact.  Skin: Warm and dry. Color is normal.  Neuro:  Strength and sensation are intact and no gross focal deficits noted.  Psych: Alert, appropriate and with  normal affect.   LABORATORY DATA:  EKG:  EKG is ordered today. This demonstrates atrial fib with controlled VR of 65 - has chronic diffuse T wave changes noted.  Lab Results  Component Value Date   WBC 5.2 08/01/2016   HGB 11.8 (L) 08/01/2016   HCT 37.5 (L) 08/01/2016   PLT 191 08/01/2016   GLUCOSE 98 08/01/2016   CHOL 194 09/22/2015   TRIG 94 09/22/2015   HDL 62 09/22/2015   LDLCALC 113 09/22/2015   ALT 10 08/01/2016   AST 13 08/01/2016   NA 139 08/01/2016   K 4.7 08/01/2016   CL 96 (L) 08/01/2016   CREATININE 12.33 (H) 08/01/2016   BUN 62 (H) 08/01/2016   CO2 26 08/01/2016   TSH 0.83 11/08/2016   INR 0.96 08/27/2013   HGBA1C 4.7 (L) 12/22/2014    BNP (last 3 results) No results for input(s): BNP in the last 8760 hours.  ProBNP (last 3 results) No results for input(s): PROBNP in the last 8760 hours.   Other Studies Reviewed Today:  Echo Study Conclusions from 2016  - Left ventricle: Abnormal GLS -9. The cavity size was normal. Wall thickness was increased in a pattern of moderate LVH. Systolic function was normal. The estimated ejection fraction was in the range of 50% to 55%. Wall motion was normal; there were no regional wall motion abnormalities. - Left atrium: The atrium was moderately dilated.   Myoview Study Highlights from 08/2105    Nuclear stress EF: 48%.  There was no ST segment deviation noted during stress.  Large in size, moderate in intensity, fixed defect involving the apical, apical anterior, apical lateral, apical inferior and mid and basal inferior walls. This is consistent with prior infarct vs. Diaphragmatic attenuation. No ischemia noted.  The left ventricular ejection fraction is mildly decreased (45-54%).  This is an intermediate risk study.   Notes Recorded by Burtis Junes, NP on 08/31/2015 at 12:18 PM Dr. [name] commented - "By the report it looks like he has an old infarct. No known history of CAD. EF mildly  reduced. I think it would be fine to continue medical therapy for now. If he were to need major surgery or if he had real anginal symptoms a cath might be indicated".   Assessment/Plan: 1. Chest pain - some exertional quality - previously felt to be more related to rotator cuff issue but now does not appear to be the case.  Would update the Myoview first. Would like to continue and try to manage medically unless has a high risk study.   2. HTN - recheck by me is much better - I have left him on his current regimen  3. Chronic atrial fib - rate is controlled  4. Multiple co-morbidities - not a good candidate for anticoagulation. Taking aspirin sporadically.   Current medicines are reviewed with the patient today.  The patient does not have concerns regarding medicines other than what has been noted above.  The following changes have been made:  See above.  Labs/ tests ordered today include:    Orders Placed This Encounter  Procedures  . Myocardial Perfusion Imaging  . EKG 12-Lead     Disposition:   FU with me tentatively in 6 months unless symptoms worsen or stress test with more abnormality.   Patient is agreeable to this plan and will call if any problems develop in the interim.   SignedTruitt Merle, NP  11/20/2016 11:18 AM  [facility]  [address] [place], [place]  [address] Phone: [phone] Fax: [phone]

## 2016-11-20 NOTE — Patient Instructions (Addendum)
We will be checking the following labs today - NONE   Medication Instructions:    Continue with your current medicines.     Testing/Procedures To Be Arranged:  2 day lexiscan  Follow-Up:   See me in 6 months    Other Special Instructions:   N/A    If you need a refill on your cardiac medications before your next appointment, please call your pharmacy.   Call the [place] office at [phone] if you have any questions, problems or concerns.

## 2016-11-21 DIAGNOSIS — D509 Iron deficiency anemia, unspecified: Secondary | ICD-10-CM | POA: Diagnosis not present

## 2016-11-21 DIAGNOSIS — D631 Anemia in chronic kidney disease: Secondary | ICD-10-CM | POA: Diagnosis not present

## 2016-11-21 DIAGNOSIS — N2581 Secondary hyperparathyroidism of renal origin: Secondary | ICD-10-CM | POA: Diagnosis not present

## 2016-11-21 DIAGNOSIS — E1129 Type 2 diabetes mellitus with other diabetic kidney complication: Secondary | ICD-10-CM | POA: Diagnosis not present

## 2016-11-21 DIAGNOSIS — N186 End stage renal disease: Secondary | ICD-10-CM | POA: Diagnosis not present

## 2016-11-22 ENCOUNTER — Telehealth (HOSPITAL_COMMUNITY): Payer: Self-pay | Admitting: *Deleted

## 2016-11-22 NOTE — Telephone Encounter (Signed)
Patient given detailed instructions per Myocardial Perfusion Study Information Sheet for the test on 11/27/16 at 0730. Patient notified to arrive 15 minutes early and that it is imperative to arrive on time for appointment to keep from having the test rescheduled.  If you need to cancel or reschedule your appointment, please call the office within 24 hours of your appointment. Failure to do so may result in a cancellation of your appointment, and a $50 no show fee. Patient verbalized understanding.Bryan Gilmore W     

## 2016-11-23 DIAGNOSIS — D631 Anemia in chronic kidney disease: Secondary | ICD-10-CM | POA: Diagnosis not present

## 2016-11-23 DIAGNOSIS — N2581 Secondary hyperparathyroidism of renal origin: Secondary | ICD-10-CM | POA: Diagnosis not present

## 2016-11-23 DIAGNOSIS — N186 End stage renal disease: Secondary | ICD-10-CM | POA: Diagnosis not present

## 2016-11-23 DIAGNOSIS — D509 Iron deficiency anemia, unspecified: Secondary | ICD-10-CM | POA: Diagnosis not present

## 2016-11-23 DIAGNOSIS — E1129 Type 2 diabetes mellitus with other diabetic kidney complication: Secondary | ICD-10-CM | POA: Diagnosis not present

## 2016-11-25 DIAGNOSIS — N186 End stage renal disease: Secondary | ICD-10-CM | POA: Diagnosis not present

## 2016-11-25 DIAGNOSIS — N2581 Secondary hyperparathyroidism of renal origin: Secondary | ICD-10-CM | POA: Diagnosis not present

## 2016-11-25 DIAGNOSIS — D509 Iron deficiency anemia, unspecified: Secondary | ICD-10-CM | POA: Diagnosis not present

## 2016-11-25 DIAGNOSIS — D631 Anemia in chronic kidney disease: Secondary | ICD-10-CM | POA: Diagnosis not present

## 2016-11-25 DIAGNOSIS — E1129 Type 2 diabetes mellitus with other diabetic kidney complication: Secondary | ICD-10-CM | POA: Diagnosis not present

## 2016-11-27 ENCOUNTER — Ambulatory Visit (HOSPITAL_COMMUNITY): Payer: Medicare Other | Attending: Cardiovascular Disease

## 2016-11-27 DIAGNOSIS — R9439 Abnormal result of other cardiovascular function study: Secondary | ICD-10-CM | POA: Diagnosis not present

## 2016-11-27 DIAGNOSIS — R0609 Other forms of dyspnea: Secondary | ICD-10-CM | POA: Diagnosis not present

## 2016-11-27 DIAGNOSIS — R002 Palpitations: Secondary | ICD-10-CM | POA: Diagnosis not present

## 2016-11-27 DIAGNOSIS — I2510 Atherosclerotic heart disease of native coronary artery without angina pectoris: Secondary | ICD-10-CM | POA: Insufficient documentation

## 2016-11-27 DIAGNOSIS — R0789 Other chest pain: Secondary | ICD-10-CM

## 2016-11-27 DIAGNOSIS — I739 Peripheral vascular disease, unspecified: Secondary | ICD-10-CM | POA: Insufficient documentation

## 2016-11-27 DIAGNOSIS — I119 Hypertensive heart disease without heart failure: Secondary | ICD-10-CM | POA: Diagnosis not present

## 2016-11-27 LAB — MYOCARDIAL PERFUSION IMAGING
LV dias vol: 152 mL (ref 62–150)
LV sys vol: 104 mL
Peak HR: 69 {beats}/min
RATE: 0.39
Rest HR: 57 {beats}/min
SDS: 5
SRS: 16
SSS: 19
TID: 1.03

## 2016-11-27 MED ORDER — REGADENOSON 0.4 MG/5ML IV SOLN
0.4000 mg | Freq: Once | INTRAVENOUS | Status: AC
  Administered 2016-11-27: 0.4 mg via INTRAVENOUS

## 2016-11-27 MED ORDER — TECHNETIUM TC 99M TETROFOSMIN IV KIT
10.1000 | PACK | Freq: Once | INTRAVENOUS | Status: AC | PRN
  Administered 2016-11-27: 10.1 via INTRAVENOUS
  Filled 2016-11-27: qty 11

## 2016-11-27 MED ORDER — TECHNETIUM TC 99M TETROFOSMIN IV KIT
32.2000 | PACK | Freq: Once | INTRAVENOUS | Status: AC | PRN
  Administered 2016-11-27: 32.2 via INTRAVENOUS
  Filled 2016-11-27: qty 33

## 2016-11-28 DIAGNOSIS — N529 Male erectile dysfunction, unspecified: Secondary | ICD-10-CM | POA: Diagnosis not present

## 2016-11-28 DIAGNOSIS — R3916 Straining to void: Secondary | ICD-10-CM | POA: Diagnosis not present

## 2016-11-28 DIAGNOSIS — R8290 Unspecified abnormal findings in urine: Secondary | ICD-10-CM | POA: Diagnosis not present

## 2016-11-28 DIAGNOSIS — Z992 Dependence on renal dialysis: Secondary | ICD-10-CM | POA: Diagnosis not present

## 2016-11-28 DIAGNOSIS — N186 End stage renal disease: Secondary | ICD-10-CM | POA: Diagnosis not present

## 2016-11-30 DIAGNOSIS — N186 End stage renal disease: Secondary | ICD-10-CM | POA: Diagnosis not present

## 2016-11-30 DIAGNOSIS — D509 Iron deficiency anemia, unspecified: Secondary | ICD-10-CM | POA: Diagnosis not present

## 2016-11-30 DIAGNOSIS — D631 Anemia in chronic kidney disease: Secondary | ICD-10-CM | POA: Diagnosis not present

## 2016-11-30 DIAGNOSIS — N2581 Secondary hyperparathyroidism of renal origin: Secondary | ICD-10-CM | POA: Diagnosis not present

## 2016-11-30 DIAGNOSIS — E1129 Type 2 diabetes mellitus with other diabetic kidney complication: Secondary | ICD-10-CM | POA: Diagnosis not present

## 2016-12-05 ENCOUNTER — Other Ambulatory Visit: Payer: Self-pay | Admitting: Internal Medicine

## 2016-12-05 DIAGNOSIS — D631 Anemia in chronic kidney disease: Secondary | ICD-10-CM | POA: Diagnosis not present

## 2016-12-05 DIAGNOSIS — D509 Iron deficiency anemia, unspecified: Secondary | ICD-10-CM | POA: Diagnosis not present

## 2016-12-05 DIAGNOSIS — B20 Human immunodeficiency virus [HIV] disease: Secondary | ICD-10-CM

## 2016-12-05 DIAGNOSIS — E1129 Type 2 diabetes mellitus with other diabetic kidney complication: Secondary | ICD-10-CM | POA: Diagnosis not present

## 2016-12-05 DIAGNOSIS — N186 End stage renal disease: Secondary | ICD-10-CM | POA: Diagnosis not present

## 2016-12-05 DIAGNOSIS — N2581 Secondary hyperparathyroidism of renal origin: Secondary | ICD-10-CM | POA: Diagnosis not present

## 2016-12-05 NOTE — Progress Notes (Signed)
CARDIOLOGY OFFICE NOTE  Date:  12/08/2016    Bryan Gilmore. Date of Birth: 06-20-1951 Medical Record #[id number]  PCP:  Gildardo Cranker, DO  Cardiologist:  [facility] & [name]   Chief Complaint  Patient presents with  . Atrial Fibrillation    History of Present Illness: Bryan Gilmore. is a 66 y.o. male who presents today for a follow up visit.   Seen Dr. [name] in the past back in 2013 initially.    He has a significant PMH of chronic atrial fib -not a good candidate for anticoagulation due to comorbidities. He has ESRD, status post surgery in June of 2013 for Fournier's gangrene with debridement. He also had a Hartmann pouch with end colostomy.  Patient does have a history of hypertension and HIV positivity. He also has a history of hepatitis C and severe protein calorie malnutrition. His AF was noted back in 2013 while hospitalized.   He has had EGD and colonoscopy   - had one polyp removed.     Seen by Truitt Merle 11/20/16 and complained of chest pain : F/U myovue reviewed: Clearly has had large IMI  Note DR [name] had reviewed myovue done 2016 With similar appearing infarct and felt medical Rx warranted but patient not having chest pain then  EF reported 48% then   Study Highlights    Nuclear stress EF: 31%.  There was no ST segment deviation noted during stress.  No T wave inversion was noted during stress.  Defect 1: There is a large defect of severe severity present in the basal inferoseptal, basal inferior, mid inferoseptal, mid inferior and apical inferior location.  Findings consistent with prior myocardial infarction.  This is a high risk study due to reduced systolic function. No ischemia.  The left ventricular ejection fraction is moderately decreased (30-44%).   He has exertional dyspnea.  No chest pain. Does get some hypotensionat  End of dialysis . Given low EF and extent of infarct discussed diagnostic cath with patient. Risks including  bleeding, MI, stroke and need for emergency CABG.  He has a functioning dialysis graft RUE and occluded gortex graft In LUE so will need to be done from femoral approach  Past Medical History:  Diagnosis Date  . Acute respiratory failure ([place])   . Alcohol abuse   . Altered mental status   . Anemia   . Anemia in chronic kidney disease(285.21)   . Arthritis    "joints" (10/12/2014)  . Atrial fibrillation ([place])   . Colostomy status ([place])   . Cough   . Disorders of phosphorus metabolism   . Dysrhythmia    AFib, not a candidate for coumadin , dr [name] signed off on pt  . End stage renal disease ([place])   . ESRD (end stage renal disease) on dialysis ([place])    "TTS; [facility] " (10/12/2014)  . Fournier's gangrene   . Gangrene ([place])   . GERD (gastroesophageal reflux disease)   . Gout   . Gouty arthritis   . Hemoptysis, unspecified   . Hepatitis A   . Hepatitis B   . Hepatitis C    "I've been treated for all of them" (10/12/2014)  . HIV (human immunodeficiency virus infection) ([place])   . Hypertension   . Hypopotassemia   . Hypothyroidism   . Myocardial infarction    "told by a dr I had evidence of one that I didn't know about" (10/12/2014)  . Other severe protein-calorie malnutrition   .  Peripheral vascular disease, unspecified    denies  . Sebaceous cyst   . Sepsis(995.91)   . Sepsis(995.91)   . Severe protein-calorie malnutrition ([place])   . Ulcer of lower limb, unspecified   . Ulcer of lower limb, unspecified   . Unspecified constipation     Past Surgical History:  Procedure Laterality Date  . AV FISTULA PLACEMENT  04/12/2012   Procedure: INSERTION OF ARTERIOVENOUS (AV) GORE-TEX GRAFT ARM;  Surgeon: Rosetta Posner, MD;  Location[facility] OR;  Service: Vascular;  Laterality: Left;  insertion of 6x50 stretch goretex graft   . AV FISTULA PLACEMENT  08/14/2012   Procedure: INSERTION OF ARTERIOVENOUS (AV) GORE-TEX GRAFT ARM;  Surgeon: Mal Misty, MD;  Location: [place];  Service:  Vascular;  Laterality: Left;  . AV FISTULA PLACEMENT Right 11/08/2012   Procedure: ARTERIOVENOUS (AV) FISTULA CREATION;  Surgeon: Angelia Mould, MD;  Location: [place];  Service: Vascular;  Laterality: Right;  . BOWEL RESECTION N/A 02/21/2013   Procedure: SMALL BOWEL RESECTION;  Surgeon: [name], MD;  Location: [place];  Service: General;  Laterality: N/A;  . COLONOSCOPY WITH PROPOFOL N/A 11/01/2016   Procedure: COLONOSCOPY WITH PROPOFOL;  Surgeon: Arta Silence, MD;  Location: [facility];  Service: Endoscopy;  Laterality: N/A;  . COLOSTOMY  02/2012   d/t an infection  . COLOSTOMY  13  . COLOSTOMY CLOSURE N/A 02/21/2013   Procedure: COLOSTOMY CLOSURE;  Surgeon: [name], MD;  Location: [place];  Service: General;  Laterality: N/A;  takedown of sigmoid colostomy with anastomosis  . CYSTOSCOPY  07/30/2012   Procedure: CYSTOSCOPY;  Surgeon: Reece Packer, MD;  Location: [facility];  Service: Urology;  Laterality: Bilateral;  retrograde  . CYSTOSCOPY WITH URETHRAL DILATATION  02/17/2012   Procedure: CYSTOSCOPY WITH URETHRAL DILATATION;  Surgeon: Reece Packer, MD;  Location: [facility];  Service: Urology;  Laterality: N/A;  retrograde urethragram, placement of foley catheter exam under anesthesia   . ESOPHAGOGASTRODUODENOSCOPY (EGD) WITH PROPOFOL N/A 11/01/2016   Procedure: ESOPHAGOGASTRODUODENOSCOPY (EGD) WITH PROPOFOL;  Surgeon: Arta Silence, MD;  Location: [facility];  Service: Endoscopy;  Laterality: N/A;  . FISTULOGRAM Right 01/06/2013   Procedure: FISTULOGRAM;  Surgeon: Angelia Mould, MD;  Location: [facility] CATH LAB;  Service: Cardiovascular;  Laterality: Right;  . HERNIA REPAIR    . INSERTION OF DIALYSIS CATHETER  02/20/2012   Procedure: INSERTION OF DIALYSIS CATHETER;  Surgeon: Angelia Mould, MD;  Location: [place];  Service: Vascular;  Laterality: N/A;  . INSERTION OF DIALYSIS CATHETER  03/29/2012   Procedure: INSERTION OF DIALYSIS CATHETER;  Surgeon:  Rosetta Posner, MD;  Location: [facility] OR;  Service: Vascular;  Laterality: N/A;  Insertion tunneled dialysis catheter right IJ  . INSERTION OF DIALYSIS CATHETER  08/04/2012   Procedure: INSERTION OF DIALYSIS CATHETER;  Surgeon: Angelia Mould, MD;  Location: [place];  Service: Vascular;  Laterality: Right;  right intrajugular hemodialysis catheter insertion  . INSERTION OF MESH N/A 10/12/2014   Procedure: INSERTION OF MESH;  Surgeon: Excell Seltzer, MD;  Location: [place];  Service: General;  Laterality: N/A;  . IRRIGATION AND DEBRIDEMENT ABSCESS  02/29/2012   Procedure: IRRIGATION AND DEBRIDEMENT ABSCESS;  Surgeon: Reece Packer, MD;  Location: [place];  Service: Urology;  Laterality: N/A;  I&D of scrotum abcess  . Surgery for Fournier's gangrene  02/2012  . THROMBECTOMY AND REVISION OF ARTERIOVENTOUS (AV) GORETEX  GRAFT  07/15/2012   Procedure: THROMBECTOMY AND REVISION OF ARTERIOVENTOUS (  AV) GORETEX  GRAFT;  Surgeon: Elam Dutch, MD;  Location: [place];  Service: Vascular;  Laterality: Left;  . URETHROGRAM  07/30/2012   Procedure: URETHROGRAM;  Surgeon: Reece Packer, MD;  Location: [facility];  Service: Urology;  Laterality: N/A;  cystogram  . VENTRAL HERNIA REPAIR  10/12/2014  . VENTRAL HERNIA REPAIR N/A 10/12/2014   Procedure: REPAIR VENTRAL INCISIONAL HERNIA;  Surgeon: Excell Seltzer, MD;  Location: [place];  Service: General;  Laterality: N/A;     Medications: Current Outpatient Prescriptions  Medication Sig Dispense Refill  . allopurinol (ZYLOPRIM) 300 MG tablet TAKE ONE TABLET BY MOUTH ONCE DAILY FOR GOUT 30 tablet 0  . AURYXIA 1 GM 210 MG(Fe) tablet Take 420mg s with every meal and 210mg s with every snack    . carvedilol (COREG) 3.125 MG tablet TAKE 1 TABLET BY MOUTH 2 TIMES DAILY WITH A MEAL 60 tablet 3  . cinacalcet (SENSIPAR) 30 MG tablet Take 30 mg by mouth 3 (three) times a week.     . dolutegravir (TIVICAY) 50 MG tablet Take 1 tablet (50 mg total) by mouth daily. 30 tablet 11   . gabapentin (NEURONTIN) 300 MG capsule Take 300 mg by mouth 3 (three) times daily.    Marland Kitchen ibuprofen (ADVIL,MOTRIN) 800 MG tablet Take 800 mg by mouth every 8 (eight) hours as needed for headache or moderate pain.    Marland Kitchen lamivudine (EPIVIR) 100 MG tablet TAKE 1/2 TABLET BY MOUTH EVERY DAY 15 tablet 4  . levothyroxine (SYNTHROID, LEVOTHROID) 125 MCG tablet TAKE 1 TABLET(125 MCG) BY MOUTH DAILY BEFORE BREAKFAST 30 tablet 0  . losartan (COZAAR) 50 MG tablet Take 50 mg by mouth daily.     Marland Kitchen omeprazole (PRILOSEC) 20 MG capsule Take 20 mg by mouth daily.    Marland Kitchen PREZCOBIX 800-150 MG tablet TAKE 1 TABLET BY MOUTH EVERY DAY WITH FOOD 30 tablet 4  . zidovudine (RETROVIR) 300 MG tablet TAKE 1 TABLET BY MOUTH EVERY DAY 30 tablet 5   No current facility-administered medications for this visit.     Allergies: No Known Allergies  Social History: The patient  reports that he has never smoked. He has never used smokeless tobacco. He reports that he drinks alcohol. He reports that he does not use drugs.   Family History: The patient's family history includes Cancer in his cousin; Hypertension in his father and mother.   Review of Systems: Please see the history of present illness.   Otherwise, the review of systems is positive for none.   All other systems are reviewed and negative.   Physical Exam: VS:  BP 140/86   Pulse 79   Ht 5\' 11"  (1.803 m)   Wt 246 lb 6.4 oz (111.8 kg)   SpO2 97%   BMI 34.37 kg/m  .  BMI Body mass index is 34.37 kg/m.  Wt Readings from Last 3 Encounters:  12/08/16 246 lb 6.4 oz (111.8 kg)  11/27/16 248 lb (112.5 kg)  11/20/16 248 lb 12.8 oz (112.9 kg)   General: Obese black male. He is alert and in no acute distress.   HEENT: Normal.  Neck: Supple, no JVD, carotid bruits, or masses noted.  Cardiac: Irregular irregular rhythm. His rate is fine. No edema.  Respiratory:  Lungs are clear to auscultation bilaterally with normal work of breathing.  GI: Soft and nontender.    MS: No deformity or atrophy. Gait and ROM intact.  Skin: Warm and dry. Color is normal.  Neuro:  Strength  and sensation are intact and no gross focal deficits noted.  Psych: Alert, appropriate and with normal affect. Fistula RUE for dialysis old gortex graft no thrill left forearm   LABORATORY DATA:  EKG:  EKG is ordered today. This demonstrates atrial fib with controlled VR of 65 - has chronic diffuse T wave changes noted.  Lab Results  Component Value Date   WBC 5.2 08/01/2016   HGB 11.8 (L) 08/01/2016   HCT 37.5 (L) 08/01/2016   PLT 191 08/01/2016   GLUCOSE 98 08/01/2016   CHOL 194 09/22/2015   TRIG 94 09/22/2015   HDL 62 09/22/2015   LDLCALC 113 09/22/2015   ALT 10 08/01/2016   AST 13 08/01/2016   NA 139 08/01/2016   K 4.7 08/01/2016   CL 96 (L) 08/01/2016   CREATININE 12.33 (H) 08/01/2016   BUN 62 (H) 08/01/2016   CO2 26 08/01/2016   TSH 0.83 11/08/2016   INR 0.96 08/27/2013   HGBA1C 4.7 (L) 12/22/2014    BNP (last 3 results) No results for input(s): BNP in the last 8760 hours.  ProBNP (last 3 results) No results for input(s): PROBNP in the last 8760 hours.   Other Studies Reviewed Today:  Echo Study Conclusions from 2016  - Left ventricle: Abnormal GLS -9. The cavity size was normal. Wall thickness was increased in a pattern of moderate LVH. Systolic function was normal. The estimated ejection fraction was in the range of 50% to 55%. Wall motion was normal; there were no regional wall motion abnormalities. - Left atrium: The atrium was moderately dilated.   Assessment/Plan: 1. Chest pain - some exertional quality myovue with large IMI and decreased EF. Dyspnea with exertion and some hypotension with dialysis Cath arranged for [facility] called orders written will have blood work today  2. HTN - recheck by me is much better - I have left him on his current regimen  3. Chronic atrial fib - rate is controlled not on anticoagulation due to  comorbidities  ASA   4. Multiple co-morbidities - not a good candidate for anticoagulation. Taking aspirin sporadically.   5. CRF on dialysis since June 2013 F/U [place] RUE fistual   Jenkins Rouge

## 2016-12-07 DIAGNOSIS — N281 Cyst of kidney, acquired: Secondary | ICD-10-CM | POA: Diagnosis not present

## 2016-12-07 DIAGNOSIS — Z992 Dependence on renal dialysis: Secondary | ICD-10-CM | POA: Diagnosis not present

## 2016-12-07 DIAGNOSIS — N186 End stage renal disease: Secondary | ICD-10-CM | POA: Diagnosis not present

## 2016-12-07 DIAGNOSIS — E349 Endocrine disorder, unspecified: Secondary | ICD-10-CM | POA: Diagnosis not present

## 2016-12-07 DIAGNOSIS — D631 Anemia in chronic kidney disease: Secondary | ICD-10-CM | POA: Diagnosis not present

## 2016-12-07 DIAGNOSIS — E1129 Type 2 diabetes mellitus with other diabetic kidney complication: Secondary | ICD-10-CM | POA: Diagnosis not present

## 2016-12-07 DIAGNOSIS — N400 Enlarged prostate without lower urinary tract symptoms: Secondary | ICD-10-CM | POA: Diagnosis not present

## 2016-12-07 DIAGNOSIS — D509 Iron deficiency anemia, unspecified: Secondary | ICD-10-CM | POA: Diagnosis not present

## 2016-12-07 DIAGNOSIS — N2581 Secondary hyperparathyroidism of renal origin: Secondary | ICD-10-CM | POA: Diagnosis not present

## 2016-12-07 DIAGNOSIS — N529 Male erectile dysfunction, unspecified: Secondary | ICD-10-CM | POA: Diagnosis not present

## 2016-12-08 ENCOUNTER — Ambulatory Visit (INDEPENDENT_AMBULATORY_CARE_PROVIDER_SITE_OTHER): Payer: Medicare Other | Admitting: Cardiovascular Disease

## 2016-12-08 ENCOUNTER — Encounter: Payer: Self-pay | Admitting: Cardiovascular Disease

## 2016-12-08 VITALS — BP 140/86 | HR 79 | Ht 71.0 in | Wt 246.4 lb

## 2016-12-08 DIAGNOSIS — I4821 Permanent atrial fibrillation: Secondary | ICD-10-CM

## 2016-12-08 DIAGNOSIS — Z01812 Encounter for preprocedural laboratory examination: Secondary | ICD-10-CM

## 2016-12-08 DIAGNOSIS — I482 Chronic atrial fibrillation: Secondary | ICD-10-CM | POA: Diagnosis not present

## 2016-12-08 DIAGNOSIS — I10 Essential (primary) hypertension: Secondary | ICD-10-CM | POA: Diagnosis not present

## 2016-12-08 LAB — CBC WITH DIFFERENTIAL/PLATELET
Basophils Absolute: 0 10*3/uL (ref 0.0–0.2)
Basos: 1 %
EOS (ABSOLUTE): 0.1 10*3/uL (ref 0.0–0.4)
Eos: 1 %
Hematocrit: 35.4 % — ABNORMAL LOW (ref 37.5–51.0)
Hemoglobin: 11.9 g/dL — ABNORMAL LOW (ref 13.0–17.7)
IMMATURE GRANS (ABS): 0 10*3/uL (ref 0.0–0.1)
Immature Granulocytes: 0 %
LYMPHS: 34 %
Lymphocytes Absolute: 1.6 10*3/uL (ref 0.7–3.1)
MCH: 34.4 pg — ABNORMAL HIGH (ref 26.6–33.0)
MCHC: 33.6 g/dL (ref 31.5–35.7)
MCV: 102 fL — ABNORMAL HIGH (ref 79–97)
MONOS ABS: 0.5 10*3/uL (ref 0.1–0.9)
Monocytes: 11 %
NEUTROS PCT: 53 %
Neutrophils Absolute: 2.5 10*3/uL (ref 1.4–7.0)
PLATELETS: 213 10*3/uL (ref 150–379)
RBC: 3.46 x10E6/uL — AB (ref 4.14–5.80)
RDW: 13.7 % (ref 12.3–15.4)
WBC: 4.8 10*3/uL (ref 3.4–10.8)

## 2016-12-08 LAB — BASIC METABOLIC PANEL
BUN/Creatinine Ratio: 4 — ABNORMAL LOW (ref 10–24)
BUN: 30 mg/dL — ABNORMAL HIGH (ref 8–27)
CALCIUM: 8.9 mg/dL (ref 8.6–10.2)
CO2: 27 mmol/L (ref 18–29)
CREATININE: 8.28 mg/dL — AB (ref 0.76–1.27)
Chloride: 91 mmol/L — ABNORMAL LOW (ref 96–106)
GFR calc Af Amer: 7 mL/min/{1.73_m2} — ABNORMAL LOW (ref >59)
GFR, EST NON AFRICAN AMERICAN: 6 mL/min/{1.73_m2} — AB (ref >59)
GLUCOSE: 101 mg/dL — AB (ref 65–99)
POTASSIUM: 4.8 mmol/L (ref 3.5–5.2)
SODIUM: 139 mmol/L (ref 134–144)

## 2016-12-08 NOTE — Patient Instructions (Addendum)
Medication Instructions:  Your physician has recommended you make the following change in your medication:  1-START taking Aspirin 81 mg by mouth daily.  Labwork: Your physician recommends that you have lab work today- bmet, cbc and pt/inr  Testing/Procedures: Your physician has requested that you have a cardiac catheterization. Cardiac catheterization is used to diagnose and/or treat various heart conditions. Doctors may recommend this procedure for a number of different reasons. The most common reason is to evaluate chest pain. Chest pain can be a symptom of coronary artery disease (CAD), and cardiac catheterization can show whether plaque is narrowing or blocking your heart's arteries. This procedure is also used to evaluate the valves, as well as measure the blood flow and oxygen levels in different parts of your heart. For further information please visit [email or web address]. Please follow instruction sheet, as given.  Follow-Up: Your physician wants you to follow-up next available with Dr. Johnsie Cancel.   If you need a refill on your cardiac medications before your next appointment, please call your pharmacy.    [place] OFFICE [address] [place] [address] Dept: [phone] Loc: [place].  12/08/2016  You are scheduled for a Cardiac Catheterization on Wednesday, April 4 with Dr. Glenetta Hew.  1. Please arrive at the [facility] (Main Entrance A) at [facility]: [address] [place], [place] [address] at 11:00 AM (two hours before your procedure to ensure your preparation). Free valet parking service is available.   Special note: Every effort is made to have your procedure done on time. Please understand that emergencies sometimes delay scheduled procedures.  2. Diet: Do not eat or drink anything after midnight prior to your procedure except sips of water  to take medications.  3. Labs: None needed.  4. Medication instructions in preparation for your procedure:  On the morning of your procedure, take your Aspirin and any morning medicines NOT listed above.  You may use sips of water.  5. Plan for one night stay--bring personal belongings. 6. Bring a current list of your medications and current insurance cards. 7. You MUST have a responsible person to drive you home. 8. Someone MUST be with you the first 24 hours after you arrive home or your discharge will be delayed. 9. Please wear clothes that are easy to get on and off and wear slip-on shoes.  Thank you for allowing [place] to care for you!   -- [facility]

## 2016-12-09 DIAGNOSIS — I120 Hypertensive chronic kidney disease with stage 5 chronic kidney disease or end stage renal disease: Secondary | ICD-10-CM | POA: Diagnosis not present

## 2016-12-09 DIAGNOSIS — D509 Iron deficiency anemia, unspecified: Secondary | ICD-10-CM | POA: Diagnosis not present

## 2016-12-09 DIAGNOSIS — N186 End stage renal disease: Secondary | ICD-10-CM | POA: Diagnosis not present

## 2016-12-09 DIAGNOSIS — D631 Anemia in chronic kidney disease: Secondary | ICD-10-CM | POA: Diagnosis not present

## 2016-12-09 DIAGNOSIS — E1129 Type 2 diabetes mellitus with other diabetic kidney complication: Secondary | ICD-10-CM | POA: Diagnosis not present

## 2016-12-09 DIAGNOSIS — N2581 Secondary hyperparathyroidism of renal origin: Secondary | ICD-10-CM | POA: Diagnosis not present

## 2016-12-09 DIAGNOSIS — Z992 Dependence on renal dialysis: Secondary | ICD-10-CM | POA: Diagnosis not present

## 2016-12-10 DIAGNOSIS — N186 End stage renal disease: Secondary | ICD-10-CM | POA: Diagnosis not present

## 2016-12-10 DIAGNOSIS — I120 Hypertensive chronic kidney disease with stage 5 chronic kidney disease or end stage renal disease: Secondary | ICD-10-CM | POA: Diagnosis not present

## 2016-12-10 DIAGNOSIS — Z992 Dependence on renal dialysis: Secondary | ICD-10-CM | POA: Diagnosis not present

## 2016-12-11 ENCOUNTER — Ambulatory Visit: Payer: Medicare Other

## 2016-12-11 ENCOUNTER — Other Ambulatory Visit: Payer: Medicare Other

## 2016-12-12 ENCOUNTER — Encounter: Payer: Self-pay | Admitting: Cardiovascular Disease

## 2016-12-12 ENCOUNTER — Emergency Department (HOSPITAL_COMMUNITY)
Admission: EM | Admit: 2016-12-12 | Discharge: 2017-01-09 | Disposition: E | Payer: Medicare Other | Attending: Emergency Medicine | Admitting: Emergency Medicine

## 2016-12-12 ENCOUNTER — Other Ambulatory Visit: Payer: Self-pay

## 2016-12-12 DIAGNOSIS — I120 Hypertensive chronic kidney disease with stage 5 chronic kidney disease or end stage renal disease: Secondary | ICD-10-CM | POA: Insufficient documentation

## 2016-12-12 DIAGNOSIS — E872 Acidosis, unspecified: Secondary | ICD-10-CM

## 2016-12-12 DIAGNOSIS — E039 Hypothyroidism, unspecified: Secondary | ICD-10-CM | POA: Insufficient documentation

## 2016-12-12 DIAGNOSIS — N2581 Secondary hyperparathyroidism of renal origin: Secondary | ICD-10-CM | POA: Diagnosis not present

## 2016-12-12 DIAGNOSIS — I469 Cardiac arrest, cause unspecified: Secondary | ICD-10-CM | POA: Diagnosis not present

## 2016-12-12 DIAGNOSIS — N186 End stage renal disease: Secondary | ICD-10-CM | POA: Diagnosis not present

## 2016-12-12 DIAGNOSIS — I252 Old myocardial infarction: Secondary | ICD-10-CM | POA: Insufficient documentation

## 2016-12-12 DIAGNOSIS — Z992 Dependence on renal dialysis: Secondary | ICD-10-CM | POA: Diagnosis not present

## 2016-12-12 DIAGNOSIS — Z79899 Other long term (current) drug therapy: Secondary | ICD-10-CM | POA: Insufficient documentation

## 2016-12-12 LAB — I-STAT CHEM 8, ED
BUN: 38 mg/dL — AB (ref 6–20)
CALCIUM ION: 0.9 mmol/L — AB (ref 1.15–1.40)
CHLORIDE: 97 mmol/L — AB (ref 101–111)
Creatinine, Ser: 7.8 mg/dL — ABNORMAL HIGH (ref 0.61–1.24)
GLUCOSE: 285 mg/dL — AB (ref 65–99)
HCT: 42 % (ref 39.0–52.0)
Hemoglobin: 14.3 g/dL (ref 13.0–17.0)
POTASSIUM: 3.5 mmol/L (ref 3.5–5.1)
Sodium: 136 mmol/L (ref 135–145)
TCO2: 21 mmol/L (ref 0–100)

## 2016-12-12 LAB — I-STAT TROPONIN, ED: Troponin i, poc: >35 ng/mL (ref 0.00–0.08)

## 2016-12-12 LAB — I-STAT CG4 LACTIC ACID, ED: LACTIC ACID, VENOUS: 12.2 mmol/L — AB (ref 0.5–1.9)

## 2016-12-12 MED ORDER — CALCIUM CHLORIDE 10 % IV SOLN
INTRAVENOUS | Status: AC | PRN
  Administered 2016-12-12 (×3): 1 g via INTRAVENOUS

## 2016-12-12 MED ORDER — EPINEPHRINE PF 1 MG/10ML IJ SOSY
PREFILLED_SYRINGE | INTRAMUSCULAR | Status: AC | PRN
  Administered 2016-12-12 (×2): 1 mg via INTRAVENOUS

## 2016-12-12 MED ORDER — SODIUM BICARBONATE 8.4 % IV SOLN
INTRAVENOUS | Status: AC | PRN
  Administered 2016-12-12: 50 meq via INTRAVENOUS

## 2016-12-13 ENCOUNTER — Ambulatory Visit (HOSPITAL_COMMUNITY): Admission: RE | Admit: 2016-12-13 | Payer: Medicare Other | Source: Ambulatory Visit | Admitting: Cardiology

## 2016-12-13 SURGERY — LEFT HEART CATH AND CORONARY ANGIOGRAPHY
Anesthesia: LOCAL

## 2016-12-15 ENCOUNTER — Other Ambulatory Visit: Payer: Medicare Other

## 2016-12-15 ENCOUNTER — Ambulatory Visit: Payer: Medicare Other

## 2016-12-25 ENCOUNTER — Other Ambulatory Visit: Payer: Medicare Other

## 2016-12-27 ENCOUNTER — Ambulatory Visit: Payer: Medicare Other | Admitting: Cardiovascular Disease

## 2016-12-27 MED FILL — Medication: Qty: 1 | Status: AC

## 2017-01-09 ENCOUNTER — Other Ambulatory Visit: Payer: Self-pay | Admitting: Internal Medicine

## 2017-01-09 ENCOUNTER — Ambulatory Visit: Payer: Medicare Other | Admitting: Internal Medicine

## 2017-01-09 NOTE — Code Documentation (Signed)
Zoll pads placed on patient. No pulse at pulse check. Compressions continued.

## 2017-01-09 NOTE — Code Documentation (Signed)
Pulse not palpated upon transfer from EMS stretcher to ED bed. Lucas compressions reinitiated at this time.

## 2017-01-09 NOTE — ED Provider Notes (Signed)
[facility] DEPT Provider Note   CSN: [id number] Arrival date & time: 01-10-17  1553     History   Chief Complaint Chief Complaint  Patient presents with  . Cardiac Arrest    HPI Bryan Rosol. is a 66 y.o. male.  The history is provided by the EMS personnel and medical records.  Cardiac Arrest  Witnessed by:  Bystander Incident location: dialysis. Time before BLS initiated:  Immediate Condition upon EMS arrival:  Unresponsive and apneic Pulse:  Absent Initial cardiac rhythm per EMS:  Ventricular fibrillation Treatments prior to arrival:  AED discharged Number of shocks delivered:  5 Defibrillation successful: yes   Rhythm after defibrillation:  Atrial fibrillation Airway: LMA. Rhythm on admission to ED:  Ventricular fibrillation   66 y.o. male complicated PMH as below presents via EMS as post-CPR. Pt had witnessed cardiac arrest during dialysis session. Initial rhythm VF, ROSC achieved after five doses epinephrine, several defibrillation, amuiodarone and one dose bicarb. Supraglottic airway placed in the field.  Past Medical History:  Diagnosis Date  . Acute respiratory failure ([place])   . Alcohol abuse   . Altered mental status   . Anemia   . Anemia in chronic kidney disease(285.21)   . Arthritis    "joints" (10/12/2014)  . Atrial fibrillation ([place])   . Colostomy status ([place])   . Cough   . Disorders of phosphorus metabolism   . Dysrhythmia    AFib, not a candidate for coumadin , dr [name] signed off on pt  . End stage renal disease ([place])   . ESRD (end stage renal disease) on dialysis ([place])    "TTS; [facility] " (10/12/2014)  . Fournier's gangrene   . Gangrene ([place])   . GERD (gastroesophageal reflux disease)   . Gout   . Gouty arthritis   . Hemoptysis, unspecified   . Hepatitis A   . Hepatitis B   . Hepatitis C    "I've been treated for all of them" (10/12/2014)  . HIV (human immunodeficiency virus infection) ([place])   . Hypertension   .  Hypopotassemia   . Hypothyroidism   . Myocardial infarction    "told by a dr I had evidence of one that I didn't know about" (10/12/2014)  . Other severe protein-calorie malnutrition   . Peripheral vascular disease, unspecified    denies  . Sebaceous cyst   . Sepsis(995.91)   . Sepsis(995.91)   . Severe protein-calorie malnutrition ([place])   . Ulcer of lower limb, unspecified   . Ulcer of lower limb, unspecified   . Unspecified constipation     Patient Active Problem List   Diagnosis Date Noted  . UTI (urinary tract infection) 06/14/2016  . ESRD on dialysis ([place]) 05/10/2016  . Chronic atrial fibrillation ([place]) 05/10/2016  . Peripheral neuropathy ([place]) 05/10/2016  . Left axillary swelling 01/05/2016  . Lump of skin of lower extremity 07/25/2015  . Hypertensive urgency 12/21/2014  . Hypothyroid 12/21/2014  . Chronic hepatitis C without hepatic coma ([place]) 11/25/2014  . Thyroid activity decreased 07/29/2014  . Essential hypertension 06/24/2014  . Floaters in visual field 06/24/2014  . Atrial fibrillation, unspecified 06/24/2014  . Esophageal reflux 06/24/2014  . Gynecomastia 11/12/2013  . Alcohol abuse   . Atrial fibrillation, permanent ([place]) 04/04/2012  . Secondary hyperparathyroidism (of renal origin) 03/29/2012  . Anemia of chronic renal failure 02/29/2012  . Normocytic anemia 02/17/2012  . Acute hepatitis C virus infection 10/07/2010  . Chronic gouty arthropathy 10/07/2010  . ERECTILE  DYSFUNCTION, ORGANIC 10/07/2010  . Human immunodeficiency virus (HIV) disease ([place]) 09/15/2010    Past Surgical History:  Procedure Laterality Date  . AV FISTULA PLACEMENT  04/12/2012   Procedure: INSERTION OF ARTERIOVENOUS (AV) GORE-TEX GRAFT ARM;  Surgeon: Rosetta Posner, MD;  Location: [facility] OR;  Service: Vascular;  Laterality: Left;  insertion of 6x50 stretch goretex graft   . AV FISTULA PLACEMENT  08/14/2012   Procedure: INSERTION OF ARTERIOVENOUS (AV) GORE-TEX GRAFT ARM;  Surgeon: Mal Misty, MD;  Location: [facility];  Service: Vascular;  Laterality: Left;  . AV FISTULA PLACEMENT Right 11/08/2012   Procedure: ARTERIOVENOUS (AV) FISTULA CREATION;  Surgeon: Angelia Mould, MD;  Location: [place];  Service: Vascular;  Laterality: Right;  . BOWEL RESECTION N/A 02/21/2013   Procedure: SMALL BOWEL RESECTION;  Surgeon: [name], MD;  Location: [facility];  Service: General;  Laterality: N/A;  . COLONOSCOPY WITH PROPOFOL N/A 11/01/2016   Procedure: COLONOSCOPY WITH PROPOFOL;  Surgeon: Arta Silence, MD;  Location: [facility];  Service: Endoscopy;  Laterality: N/A;  . COLOSTOMY  02/2012   d/t an infection  . COLOSTOMY  13  . COLOSTOMY CLOSURE N/A 02/21/2013   Procedure: COLOSTOMY CLOSURE;  Surgeon: [name], MD;  Location: [place];  Service: General;  Laterality: N/A;  takedown of sigmoid colostomy with anastomosis  . CYSTOSCOPY  07/30/2012   Procedure: CYSTOSCOPY;  Surgeon: Reece Packer, MD;  Location: [facility];  Service: Urology;  Laterality: Bilateral;  retrograde  . CYSTOSCOPY WITH URETHRAL DILATATION  02/17/2012   Procedure: CYSTOSCOPY WITH URETHRAL DILATATION;  Surgeon: Reece Packer, MD;  Location: [facility];  Service: Urology;  Laterality: N/A;  retrograde urethragram, placement of foley catheter exam under anesthesia   . ESOPHAGOGASTRODUODENOSCOPY (EGD) WITH PROPOFOL N/A 11/01/2016   Procedure: ESOPHAGOGASTRODUODENOSCOPY (EGD) WITH PROPOFOL;  Surgeon: Arta Silence, MD;  Location: [facility];  Service: Endoscopy;  Laterality: N/A;  . FISTULOGRAM Right 01/06/2013   Procedure: FISTULOGRAM;  Surgeon: Angelia Mould, MD;  Location: [facility] CATH LAB;  Service: Cardiovascular;  Laterality: Right;  . HERNIA REPAIR    . INSERTION OF DIALYSIS CATHETER  02/20/2012   Procedure: INSERTION OF DIALYSIS CATHETER;  Surgeon: Angelia Mould, MD;  Location: [place];  Service: Vascular;  Laterality: N/A;  . INSERTION OF DIALYSIS CATHETER  03/29/2012   Procedure:  INSERTION OF DIALYSIS CATHETER;  Surgeon: Rosetta Posner, MD;  Location: [facility] OR;  Service: Vascular;  Laterality: N/A;  Insertion tunneled dialysis catheter right IJ  . INSERTION OF DIALYSIS CATHETER  08/04/2012   Procedure: INSERTION OF DIALYSIS CATHETER;  Surgeon: Angelia Mould, MD;  Location: [facility];  Service: Vascular;  Laterality: Right;  right intrajugular hemodialysis catheter insertion  . INSERTION OF MESH N/A 10/12/2014   Procedure: INSERTION OF MESH;  Surgeon: Excell Seltzer, MD;  Location: [facility];  Service: General;  Laterality: N/A;  . IRRIGATION AND DEBRIDEMENT ABSCESS  02/29/2012   Procedure: IRRIGATION AND DEBRIDEMENT ABSCESS;  Surgeon: Reece Packer, MD;  Location: [facility];  Service: Urology;  Laterality: N/A;  I&D of scrotum abcess  . Surgery for Fournier's gangrene  02/2012  . THROMBECTOMY AND REVISION OF ARTERIOVENTOUS (AV) GORETEX  GRAFT  07/15/2012   Procedure: THROMBECTOMY AND REVISION OF ARTERIOVENTOUS (AV) GORETEX  GRAFT;  Surgeon: Elam Dutch, MD;  Location: [facility];  Service: Vascular;  Laterality: Left;  . URETHROGRAM  07/30/2012   Procedure: URETHROGRAM;  Surgeon: Reece Packer, MD;  Location:  [facility];  Service: Urology;  Laterality: N/A;  cystogram  . VENTRAL HERNIA REPAIR  10/12/2014  . VENTRAL HERNIA REPAIR N/A 10/12/2014   Procedure: REPAIR VENTRAL INCISIONAL HERNIA;  Surgeon: Excell Seltzer, MD;  Location: [facility];  Service: General;  Laterality: N/A;       Home Medications    Prior to Admission medications   Medication Sig Start Date End Date Taking? Authorizing Provider  allopurinol (ZYLOPRIM) 300 MG tablet TAKE ONE TABLET BY MOUTH ONCE DAILY FOR GOUT 08/08/13   Lauree Chandler, NP  AURYXIA 1 GM 210 MG(Fe) tablet Take 420mg s with every meal and 210mg s with every snack 08/09/16   Historical Provider, MD  carvedilol (COREG) 3.125 MG tablet TAKE 1 TABLET BY MOUTH 2 TIMES DAILY WITH A MEAL 06/21/16   Gildardo Cranker, DO  cephALEXin (KEFLEX) 500 MG  capsule Take 500 mg by mouth 3 (three) times daily. 12/04/16   Historical Provider, MD  cinacalcet (SENSIPAR) 30 MG tablet Take 60 mg by mouth 3 (three) times a week. At dialysis    Historical Provider, MD  dolutegravir (TIVICAY) 50 MG tablet Take 1 tablet (50 mg total) by mouth daily. 02/23/16   Carlyle Basques, MD  gabapentin (NEURONTIN) 300 MG capsule Take 300 mg by mouth 2 (two) times daily.  11/13/16   Historical Provider, MD  ibuprofen (ADVIL,MOTRIN) 800 MG tablet Take 800 mg by mouth every 8 (eight) hours as needed for headache or moderate pain.    Historical Provider, MD  lamivudine (EPIVIR) 100 MG tablet TAKE 1/2 TABLET BY MOUTH EVERY DAY 09/06/16   Carlyle Basques, MD  levothyroxine (SYNTHROID, LEVOTHROID) 125 MCG tablet TAKE 1 TABLET(125 MCG) BY MOUTH DAILY BEFORE BREAKFAST 12/05/16   Gildardo Cranker, DO  losartan (COZAAR) 50 MG tablet Take 50 mg by mouth daily.  03/17/15   Historical Provider, MD  omeprazole (PRILOSEC) 20 MG capsule Take 20 mg by mouth daily as needed (acid reflux).  10/04/16   Historical Provider, MD  PREZCOBIX 800-150 MG tablet TAKE 1 TABLET BY MOUTH EVERY DAY WITH FOOD 12/05/16   Carlyle Basques, MD  zidovudine (RETROVIR) 300 MG tablet TAKE 1 TABLET BY MOUTH EVERY DAY 10/03/16   Carlyle Basques, MD    Family History Family History  Problem Relation Age of Onset  . Hypertension Mother   . Hypertension Father   . Cancer Cousin     breast    Social History Social History  Substance Use Topics  . Smoking status: Never Smoker  . Smokeless tobacco: Never Used  . Alcohol use Yes     Comment: occasional      Allergies   Patient has no known allergies.   Review of Systems Review of Systems  Unable to perform ROS: Patient unresponsive     Physical Exam Updated Vital Signs BP 133/70   Pulse (!) 0   Temp (!) 96.6 F (35.9 C) (Axillary)   Resp (!) 44   Wt 111.6 kg   SpO2 (!) 0%   BMI 34.31 kg/m   Physical Exam  Constitutional: He appears well-developed and  well-nourished. He is intubated (LMA).  HENT:  Head: Normocephalic and atraumatic.  Eyes: Conjunctivae are normal.  Pupils 4 mm, unreactive  Pulmonary/Chest: Breath sounds normal. He is intubated (LMA).  Abdominal: Soft. He exhibits distension.  Musculoskeletal: He exhibits no edema.  IO RLE. Fistula accessed RUE  Neurological: He is unresponsive. GCS eye subscore is 1. GCS verbal subscore is 1. GCS motor subscore is 1.  Skin:  Skin is warm and dry.  Nursing note and vitals reviewed.    ED Treatments / Results  Labs (all labs ordered are listed, but only abnormal results are displayed) Labs Reviewed  I-STAT CHEM 8, ED - Abnormal; Notable for the following:       Result Value   Chloride 97 (*)    BUN 38 (*)    Creatinine, Ser 7.80 (*)    Glucose, Bld 285 (*)    Calcium, Ion 0.90 (*)    All other components within normal limits  I-STAT TROPOININ, ED - Abnormal; Notable for the following:    Troponin i, poc >35.00 (*)    All other components within normal limits  I-STAT CG4 LACTIC ACID, ED - Abnormal; Notable for the following:    Lactic Acid, Venous 12.20 (*)    All other components within normal limits    EKG  EKG Interpretation None       Radiology No results found.  Procedures Procedures (including critical care time)    EMERGENCY DEPARTMENT [place] CARDIAC EXAM "Study: Limited Ultrasound of the Heart and Pericardium"  INDICATIONS:Cardiac arrest Multiple views of the heart and pericardium were obtained in real-time with a multi-frequency probe.  PERFORMED FX:JOITGP IMAGES ARCHIVED?: Yes LIMITATIONS:  Body habitus VIEWS USED: Parasternal long axis INTERPRETATION: Cardiac activity absent    Medications Ordered in ED Medications  calcium chloride injection (1 g Intravenous Given December 20, 2016 1610)  EPINEPHrine (ADRENALIN) 1 MG/10ML injection (1 mg Intravenous Given 12-20-2016 1604)  sodium bicarbonate injection (50 mEq Intravenous Given Dec 20, 2016 1606)     Initial  Impression / Assessment and Plan / ED Course  I have reviewed the triage vital signs and the nursing notes.  Pertinent labs & imaging results that were available during my care of the patient were reviewed by me and considered in my medical decision making (see chart for details).    66 y.o. male presents post-CPR. Pt unresponsive with LMA in place. Pulseless on arrival. CPR resumed. Several rounds of epinephrine, calcium and bicarb. Rhythm checks showed ventricular fibrillation, no conversion with defibrillation. Bedside ultrasound showed no cardiac activity. Despite exhaustive efforts, ROSC was not achieved. Code was eventually terminated, time of death 1614.  Final Clinical Impressions(s) / ED Diagnoses   Final diagnoses:  Cardiac arrest ([place])  Lactic acidosis    New Prescriptions Discharge Medication List as of 2016/12/20  6:49 PM       Monico Blitz, MD 12/13/16 [place], DO 12/13/16 1526

## 2017-01-09 NOTE — ED Triage Notes (Signed)
Per [facility]: Pt to ED from [facility] as a post CPR. Pt went down at facility and compressions were started immediately. Per EMS, pt was shocked by AED by staff members. Pt in VF upon EMS arrival - they gave total of 5 epi, 2 bicarb, and 300mg  amiodarone and shocked at least 4 times (at 200J, 300J, and 360J x 2) with pt converting to A-fib afterward. Pt has R femoral IO access. R fistula accessed from dialysis still in place. Pt intubated by EMS. No palpable pulse upon arrival - [place] compressions reinitiated.

## 2017-01-09 NOTE — Code Documentation (Signed)
Patient time of death occurred at 4, pronounced by Dr. Tyrone Nine.

## 2017-01-09 NOTE — ED Notes (Signed)
Patient taken to morgue. 

## 2017-01-09 DEATH — deceased

## 2017-01-10 ENCOUNTER — Ambulatory Visit: Payer: Medicare Other | Admitting: Internal Medicine

## 2017-01-15 ENCOUNTER — Ambulatory Visit: Payer: Medicare Other | Admitting: Internal Medicine

## 2017-02-09 ENCOUNTER — Ambulatory Visit: Payer: Medicare Other | Admitting: Internal Medicine

## 2017-05-21 ENCOUNTER — Ambulatory Visit: Payer: Medicare Other | Admitting: Nurse Practitioner

## 2017-05-22 ENCOUNTER — Ambulatory Visit: Payer: Medicare Other | Admitting: Nurse Practitioner

## 2017-05-23 ENCOUNTER — Ambulatory Visit: Payer: Medicare Other | Admitting: Nurse Practitioner

## 2017-07-28 IMAGING — US US ABDOMEN LIMITED
1 series · 14 of 25 positions shown · non-contrast
Comparison: Prior CT scan of the abdomen and pelvis 06/07/2016;
prior abdominal ultrasound 01/28/2016

CLINICAL DATA: 65-year-old male with chronic hepatitis-C and
abdominal pain since having a hernia repair approximately 1 year ago

EXAM:
US ABDOMEN LIMITED - RIGHT UPPER QUADRANT

[Series 1: us abdomen limited · 0.31mm/px · 14 of 47 slices shown]
[im 1/47]
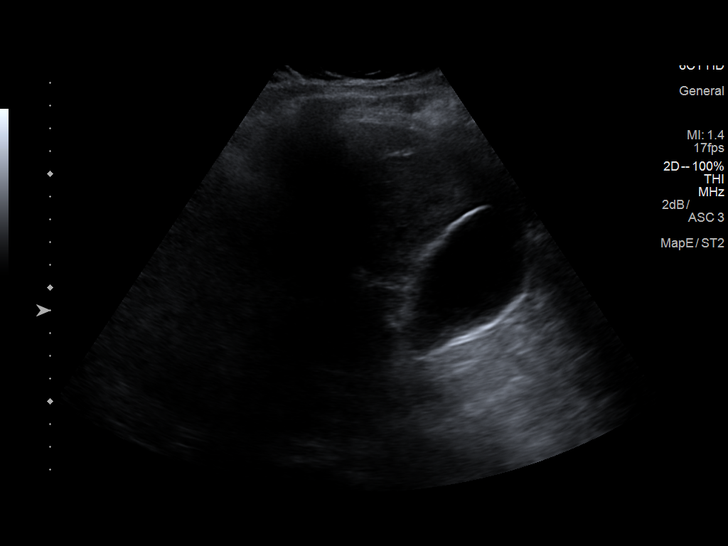
[im 4/47]
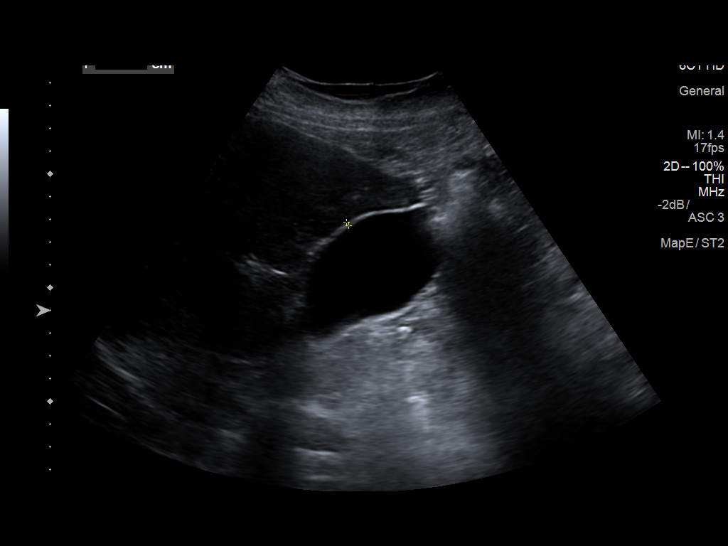
[im 8/47]
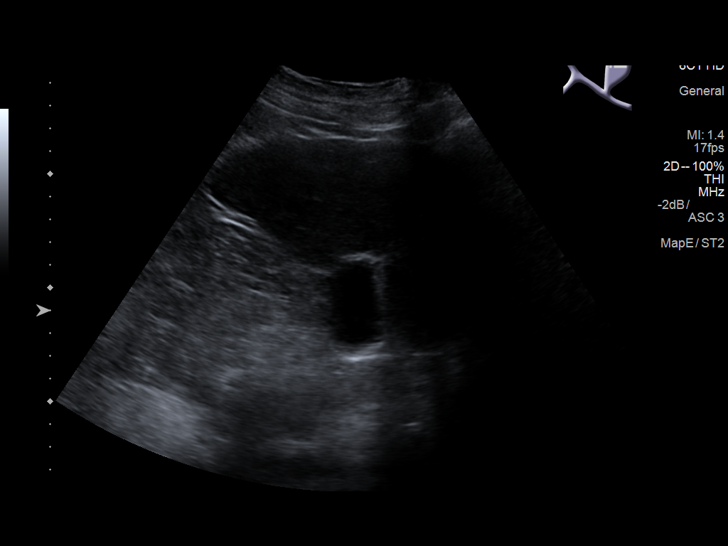
[im 12/47]
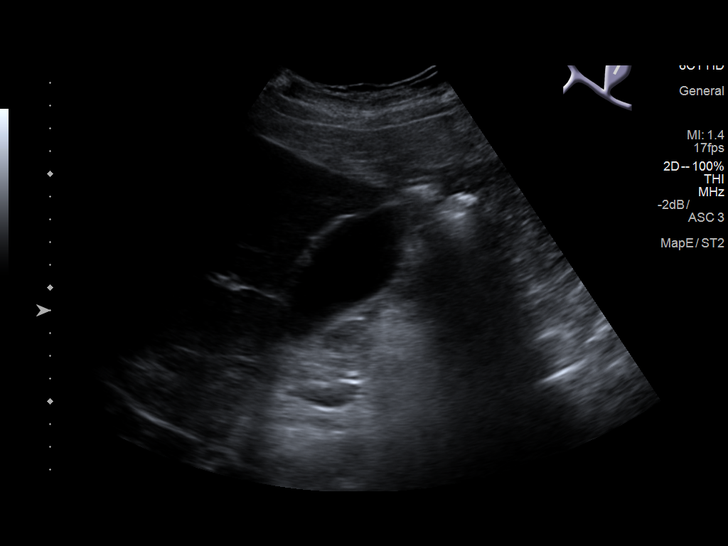
[im 16/47]
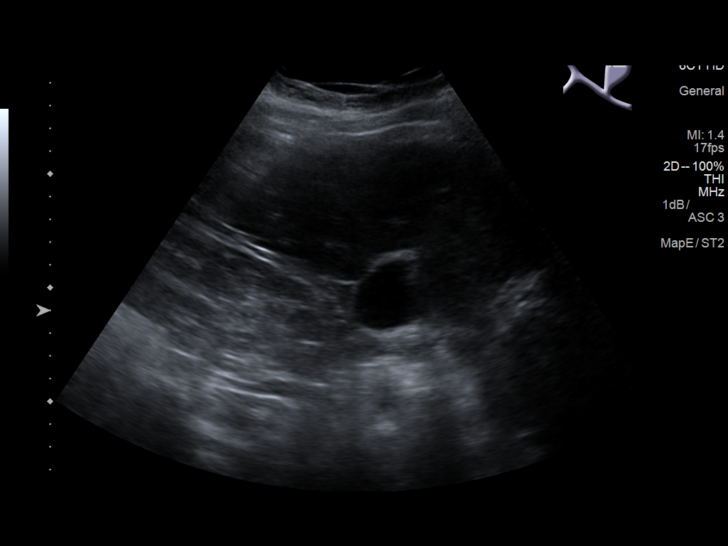
[im 18/47]
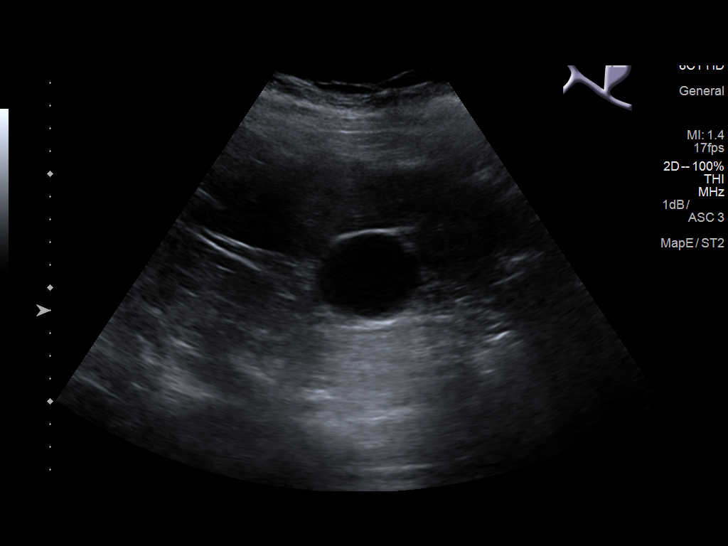
[im 22/47]
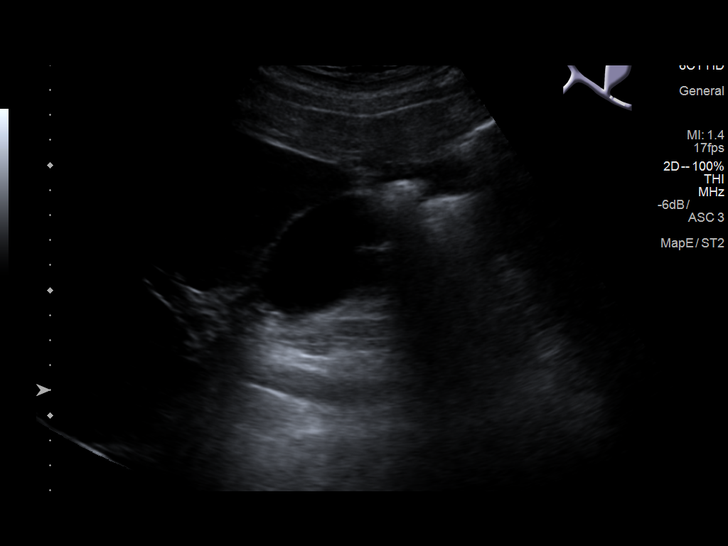
[im 25/47]
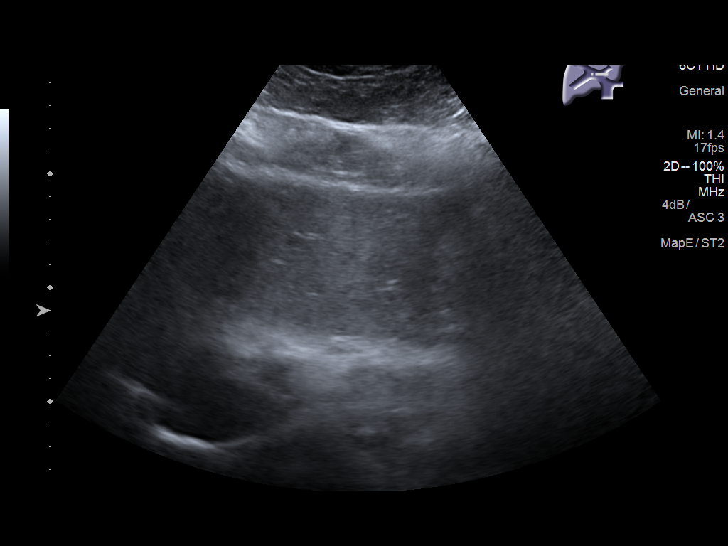
[im 29/47]
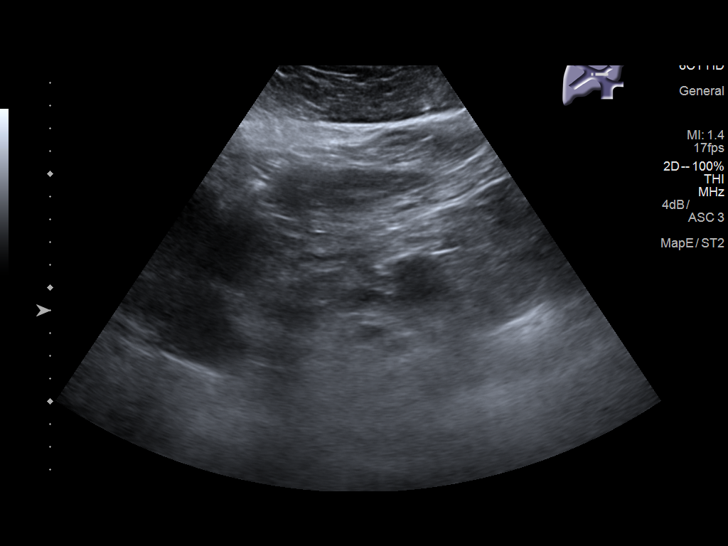
[im 31/47]
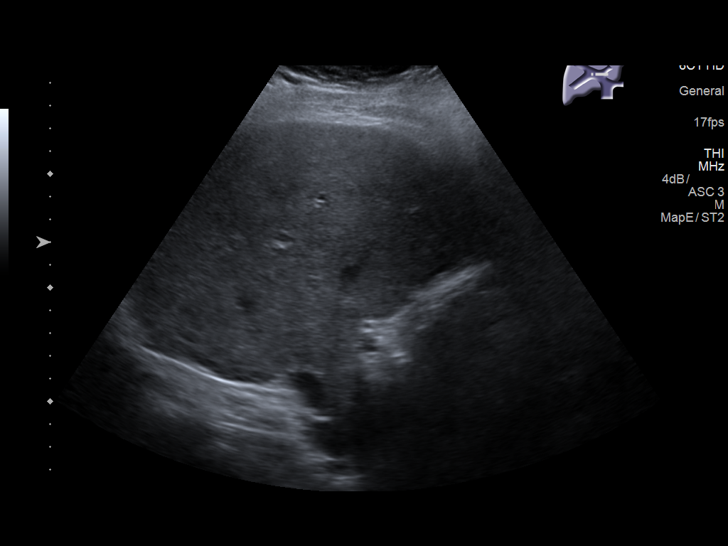
[im 35/47]
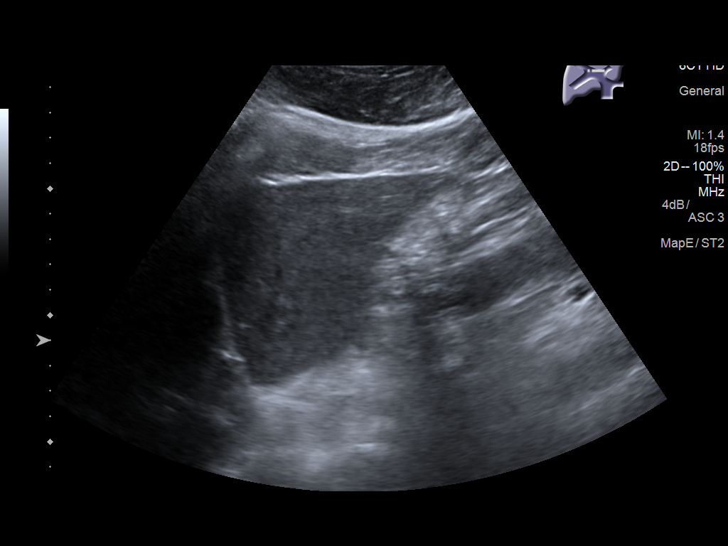
[im 39/47]
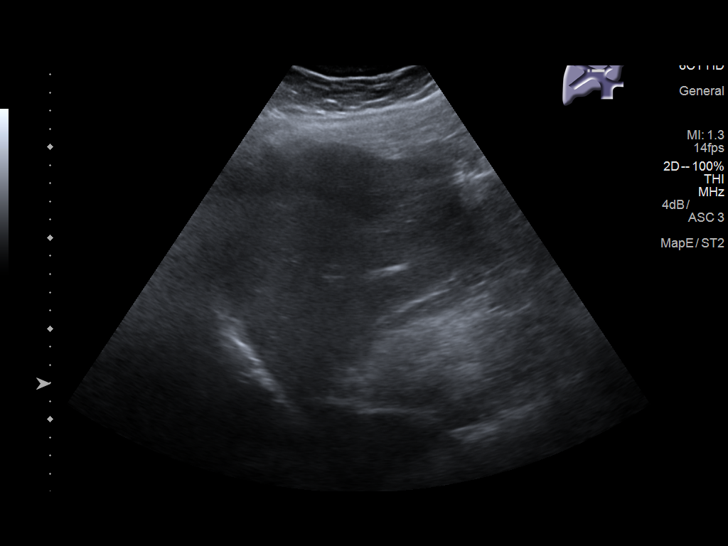
[im 43/47]
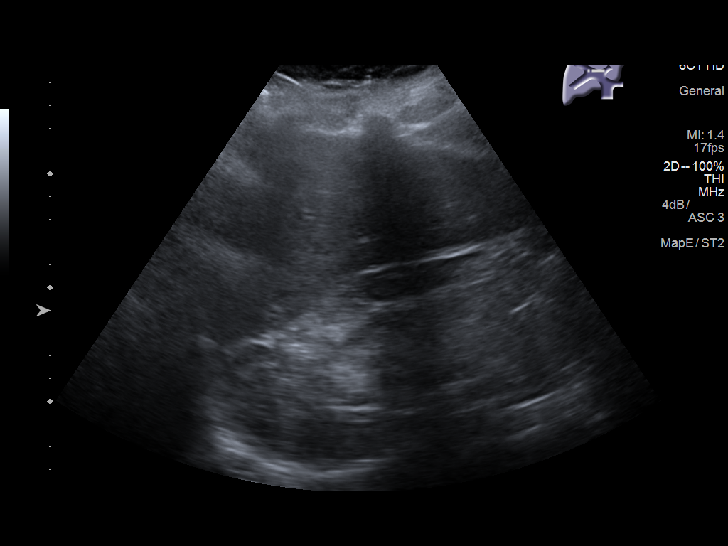
[im 47/47]
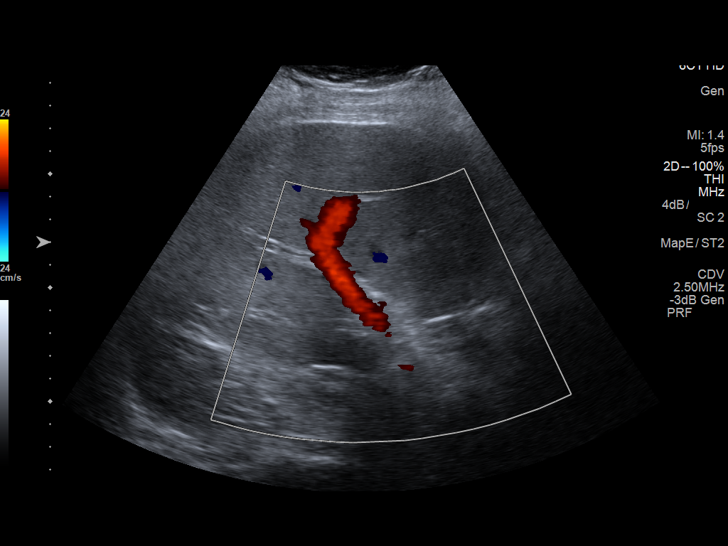

[14 of 25 positions shown; findings below may reference images not displayed]

FINDINGS: Gallbladder:

No gallstones or wall thickening visualized. No sonographic Murphy
sign noted by sonographer.

Common bile duct:

Diameter: Within normal limits at 4 mm

Liver:

Heterogeneous, slightly hyperechoic liver parenchyma. The
echotexture is coarsened. The main portal vein is patent with normal
hepatopetal flow.

Other: Partially imaged right kidney is relatively small and
echogenic in appearance suggesting chronic medical renal disease.
IMPRESSION: 1. No evidence of cholelithiasis or acute abnormality.
2. Heterogeneous slightly hyperechoic liver parenchyma. Similar
findings can be seen in the setting of fatty liver and hepatic
cirrhosis.
3. Small, echogenic right kidney. Does the patient have a clinical
history of chronic medical renal disease?
